# Patient Record
Sex: Male | Born: 1940
Health system: Southern US, Community
[De-identification: ages and names within clinical notes are randomized; demographics above are authoritative.]

## PROBLEM LIST (undated history)

## (undated) DIAGNOSIS — G479 Sleep disorder, unspecified: Secondary | ICD-10-CM

## (undated) DIAGNOSIS — R609 Edema, unspecified: Secondary | ICD-10-CM

## (undated) DIAGNOSIS — E538 Deficiency of other specified B group vitamins: Secondary | ICD-10-CM

## (undated) DIAGNOSIS — N390 Urinary tract infection, site not specified: Secondary | ICD-10-CM

## (undated) DIAGNOSIS — I10 Essential (primary) hypertension: Secondary | ICD-10-CM

## (undated) DIAGNOSIS — J302 Other seasonal allergic rhinitis: Secondary | ICD-10-CM

## (undated) DIAGNOSIS — G20 Parkinson's disease: Secondary | ICD-10-CM

## (undated) DIAGNOSIS — E785 Hyperlipidemia, unspecified: Secondary | ICD-10-CM

## (undated) DIAGNOSIS — R7303 Prediabetes: Secondary | ICD-10-CM

## (undated) DIAGNOSIS — N400 Enlarged prostate without lower urinary tract symptoms: Secondary | ICD-10-CM

## (undated) DIAGNOSIS — G20A1 Parkinson's disease without dyskinesia, without mention of fluctuations: Secondary | ICD-10-CM

## (undated) DIAGNOSIS — G4700 Insomnia, unspecified: Secondary | ICD-10-CM

## (undated) DIAGNOSIS — A419 Sepsis, unspecified organism: Secondary | ICD-10-CM

## (undated) HISTORY — DX: Other seasonal allergic rhinitis: J30.2

## (undated) HISTORY — DX: Insomnia, unspecified: G47.00

## (undated) HISTORY — DX: Edema, unspecified: R60.9

## (undated) HISTORY — DX: Benign prostatic hyperplasia without lower urinary tract symptoms: N40.0

## (undated) HISTORY — DX: Hyperlipidemia, unspecified: E78.5

## (undated) HISTORY — DX: Essential (primary) hypertension: I10

## (undated) HISTORY — DX: Sleep disorder, unspecified: G47.9

## (undated) HISTORY — DX: Urinary tract infection, site not specified: N39.0

---
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
## 1998-03-21 ENCOUNTER — Ambulatory Visit (HOSPITAL_COMMUNITY): Admission: RE | Admit: 1998-03-21 | Discharge: 1998-03-21 | Payer: Self-pay | Admitting: *Deleted

## 1998-04-10 ENCOUNTER — Encounter (HOSPITAL_COMMUNITY): Admission: RE | Admit: 1998-04-10 | Discharge: 1998-07-09 | Payer: Self-pay | Admitting: *Deleted

## 1998-08-14 ENCOUNTER — Ambulatory Visit (HOSPITAL_COMMUNITY): Admission: RE | Admit: 1998-08-14 | Discharge: 1998-08-14 | Payer: Self-pay | Admitting: *Deleted

## 2004-08-20 ENCOUNTER — Ambulatory Visit: Payer: Self-pay | Admitting: Internal Medicine

## 2005-02-18 ENCOUNTER — Ambulatory Visit: Payer: Self-pay | Admitting: Internal Medicine

## 2005-08-12 ENCOUNTER — Ambulatory Visit: Payer: Self-pay | Admitting: Internal Medicine

## 2005-08-20 ENCOUNTER — Ambulatory Visit: Payer: Self-pay | Admitting: Internal Medicine

## 2005-09-10 ENCOUNTER — Ambulatory Visit: Payer: Self-pay | Admitting: Internal Medicine

## 2006-02-17 ENCOUNTER — Ambulatory Visit: Payer: Self-pay | Admitting: Internal Medicine

## 2006-03-04 ENCOUNTER — Emergency Department (HOSPITAL_COMMUNITY): Admission: EM | Admit: 2006-03-04 | Discharge: 2006-03-04 | Payer: Self-pay | Admitting: Emergency Medicine

## 2006-08-11 ENCOUNTER — Ambulatory Visit: Payer: Self-pay | Admitting: Internal Medicine

## 2006-08-11 LAB — CONVERTED CEMR LAB
ALT: 24 units/L (ref 0–40)
AST: 23 units/L (ref 0–37)
Albumin: 3.6 g/dL (ref 3.5–5.2)
Alkaline Phosphatase: 86 units/L (ref 39–117)
BUN: 10 mg/dL (ref 6–23)
Basophils Absolute: 0 10*3/uL (ref 0.0–0.1)
Basophils Relative: 0.9 % (ref 0.0–1.0)
Bilirubin Urine: NEGATIVE
Bilirubin, Direct: 0.2 mg/dL (ref 0.0–0.3)
CO2: 30 meq/L (ref 19–32)
Calcium: 9.1 mg/dL (ref 8.4–10.5)
Chloride: 105 meq/L (ref 96–112)
Cholesterol: 152 mg/dL (ref 0–200)
Creatinine, Ser: 1.2 mg/dL (ref 0.4–1.5)
Crystals: NEGATIVE
Eosinophils Absolute: 0.1 10*3/uL (ref 0.0–0.6)
Eosinophils Relative: 3.3 % (ref 0.0–5.0)
GFR calc Af Amer: 78 mL/min
GFR calc non Af Amer: 64 mL/min
Glucose, Bld: 122 mg/dL — ABNORMAL HIGH (ref 70–99)
HCT: 43.6 % (ref 39.0–52.0)
HDL: 35.4 mg/dL — ABNORMAL LOW (ref >39.0)
Hemoglobin, Urine: NEGATIVE
Hemoglobin: 15.1 g/dL (ref 13.0–17.0)
LDL Cholesterol: 81 mg/dL (ref 0–99)
Lymphocytes Relative: 42.8 % (ref 12.0–46.0)
MCHC: 34.5 g/dL (ref 30.0–36.0)
MCV: 87.6 fL (ref 78.0–100.0)
Monocytes Absolute: 0.4 10*3/uL (ref 0.2–0.7)
Monocytes Relative: 10.7 % (ref 3.0–11.0)
Neutro Abs: 1.4 10*3/uL (ref 1.4–7.7)
Neutrophils Relative %: 42.3 % — ABNORMAL LOW (ref 43.0–77.0)
Nitrite: NEGATIVE
PSA: 0.24 ng/mL (ref 0.10–4.00)
Platelets: 214 10*3/uL (ref 150–400)
Potassium: 3.7 meq/L (ref 3.5–5.1)
RBC: 4.98 M/uL (ref 4.22–5.81)
RDW: 12.4 % (ref 11.5–14.6)
Sodium: 141 meq/L (ref 135–145)
Specific Gravity, Urine: 1.02 (ref 1.000–1.03)
TSH: 2.22 microintl units/mL (ref 0.35–5.50)
Total Bilirubin: 1.3 mg/dL — ABNORMAL HIGH (ref 0.3–1.2)
Total CHOL/HDL Ratio: 4.3
Total Protein, Urine: NEGATIVE mg/dL
Total Protein: 6.1 g/dL (ref 6.0–8.3)
Triglycerides: 180 mg/dL — ABNORMAL HIGH (ref 0–149)
Urine Glucose: NEGATIVE mg/dL
Urobilinogen, UA: 0.2 (ref 0.0–1.0)
VLDL: 36 mg/dL (ref 0–40)
WBC: 3.4 10*3/uL — ABNORMAL LOW (ref 4.5–10.5)
pH: 6.5 (ref 5.0–8.0)

## 2006-08-19 ENCOUNTER — Ambulatory Visit: Payer: Self-pay | Admitting: Internal Medicine

## 2007-08-26 ENCOUNTER — Ambulatory Visit: Payer: Self-pay | Admitting: Internal Medicine

## 2007-08-26 DIAGNOSIS — I10 Essential (primary) hypertension: Secondary | ICD-10-CM

## 2007-08-26 DIAGNOSIS — N529 Male erectile dysfunction, unspecified: Secondary | ICD-10-CM | POA: Insufficient documentation

## 2007-09-02 ENCOUNTER — Ambulatory Visit: Payer: Self-pay | Admitting: Gastroenterology

## 2007-09-02 ENCOUNTER — Ambulatory Visit: Payer: Self-pay | Admitting: Internal Medicine

## 2007-09-04 LAB — CONVERTED CEMR LAB
ALT: 18 units/L (ref 0–53)
AST: 18 units/L (ref 0–37)
Albumin: 3.7 g/dL (ref 3.5–5.2)
Alkaline Phosphatase: 86 units/L (ref 39–117)
BUN: 16 mg/dL (ref 6–23)
Basophils Absolute: 0 10*3/uL (ref 0.0–0.1)
Basophils Relative: 0.7 % (ref 0.0–1.0)
Bilirubin Urine: NEGATIVE
Bilirubin, Direct: 0.2 mg/dL (ref 0.0–0.3)
CO2: 30 meq/L (ref 19–32)
Calcium: 9.1 mg/dL (ref 8.4–10.5)
Chloride: 104 meq/L (ref 96–112)
Cholesterol: 129 mg/dL (ref 0–200)
Creatinine, Ser: 1.3 mg/dL (ref 0.4–1.5)
Eosinophils Absolute: 0.1 10*3/uL (ref 0.0–0.6)
Eosinophils Relative: 3 % (ref 0.0–5.0)
GFR calc Af Amer: 71 mL/min
GFR calc non Af Amer: 59 mL/min
Glucose, Bld: 109 mg/dL — ABNORMAL HIGH (ref 70–99)
HCT: 42 % (ref 39.0–52.0)
HDL: 23.1 mg/dL — ABNORMAL LOW (ref >39.0)
Hemoglobin, Urine: NEGATIVE
Hemoglobin: 14.7 g/dL (ref 13.0–17.0)
Ketones, ur: NEGATIVE mg/dL
LDL Cholesterol: 74 mg/dL (ref 0–99)
Leukocytes, UA: NEGATIVE
Lymphocytes Relative: 43.3 % (ref 12.0–46.0)
MCHC: 35 g/dL (ref 30.0–36.0)
MCV: 86.6 fL (ref 78.0–100.0)
Monocytes Absolute: 0.5 10*3/uL (ref 0.2–0.7)
Monocytes Relative: 12.3 % — ABNORMAL HIGH (ref 3.0–11.0)
Neutro Abs: 1.5 10*3/uL (ref 1.4–7.7)
Neutrophils Relative %: 40.7 % — ABNORMAL LOW (ref 43.0–77.0)
Nitrite: NEGATIVE
PSA: 0.19 ng/mL (ref 0.10–4.00)
Platelets: 204 10*3/uL (ref 150–400)
Potassium: 3.3 meq/L — ABNORMAL LOW (ref 3.5–5.1)
RBC: 4.85 M/uL (ref 4.22–5.81)
RDW: 12.3 % (ref 11.5–14.6)
Sodium: 141 meq/L (ref 135–145)
Specific Gravity, Urine: 1.025 (ref 1.000–1.03)
TSH: 2.29 microintl units/mL (ref 0.35–5.50)
Total Bilirubin: 1.5 mg/dL — ABNORMAL HIGH (ref 0.3–1.2)
Total CHOL/HDL Ratio: 5.6
Total Protein, Urine: NEGATIVE mg/dL
Total Protein: 6.1 g/dL (ref 6.0–8.3)
Triglycerides: 162 mg/dL — ABNORMAL HIGH (ref 0–149)
Urine Glucose: NEGATIVE mg/dL
Urobilinogen, UA: 0.2 (ref 0.0–1.0)
VLDL: 32 mg/dL (ref 0–40)
WBC: 3.7 10*3/uL — ABNORMAL LOW (ref 4.5–10.5)
pH: 5.5 (ref 5.0–8.0)

## 2007-09-23 ENCOUNTER — Encounter: Payer: Self-pay | Admitting: Gastroenterology

## 2007-09-23 ENCOUNTER — Ambulatory Visit: Payer: Self-pay | Admitting: Gastroenterology

## 2008-02-24 ENCOUNTER — Ambulatory Visit: Payer: Self-pay | Admitting: Internal Medicine

## 2008-08-23 ENCOUNTER — Ambulatory Visit: Payer: Self-pay | Admitting: Internal Medicine

## 2008-08-23 LAB — CONVERTED CEMR LAB
ALT: 19 units/L (ref 0–53)
Albumin: 3.7 g/dL (ref 3.5–5.2)
Alkaline Phosphatase: 108 units/L (ref 39–117)
BUN: 12 mg/dL (ref 6–23)
Bacteria, UA: NEGATIVE
Bilirubin, Direct: 0.3 mg/dL (ref 0.0–0.3)
CO2: 32 meq/L (ref 19–32)
Crystals: NEGATIVE
Eosinophils Relative: 2.6 % (ref 0.0–5.0)
GFR calc Af Amer: 96 mL/min
Glucose, Bld: 109 mg/dL — ABNORMAL HIGH (ref 70–99)
HCT: 43.3 % (ref 39.0–52.0)
HDL: 27.5 mg/dL — ABNORMAL LOW (ref >39.0)
Hemoglobin: 15.3 g/dL (ref 13.0–17.0)
Lymphocytes Relative: 45.8 % (ref 12.0–46.0)
Monocytes Absolute: 0.4 10*3/uL (ref 0.1–1.0)
Monocytes Relative: 10.8 % (ref 3.0–12.0)
Neutro Abs: 1.5 10*3/uL (ref 1.4–7.7)
Nitrite: NEGATIVE
Potassium: 3.6 meq/L (ref 3.5–5.1)
RBC: 4.97 M/uL (ref 4.22–5.81)
Sodium: 140 meq/L (ref 135–145)
Specific Gravity, Urine: 1.02 (ref 1.000–1.035)
Squamous Epithelial / LPF: NEGATIVE /lpf
Total CHOL/HDL Ratio: 5.2
Total Protein: 6.4 g/dL (ref 6.0–8.3)
WBC: 3.8 10*3/uL — ABNORMAL LOW (ref 4.5–10.5)
pH: 6.5 (ref 5.0–8.0)

## 2008-08-30 ENCOUNTER — Ambulatory Visit: Payer: Self-pay | Admitting: Internal Medicine

## 2008-08-30 DIAGNOSIS — H60399 Other infective otitis externa, unspecified ear: Secondary | ICD-10-CM | POA: Insufficient documentation

## 2008-08-30 DIAGNOSIS — N419 Inflammatory disease of prostate, unspecified: Secondary | ICD-10-CM | POA: Insufficient documentation

## 2009-02-28 ENCOUNTER — Ambulatory Visit: Payer: Self-pay | Admitting: Internal Medicine

## 2009-02-28 LAB — CONVERTED CEMR LAB
BUN: 10 mg/dL (ref 6–23)
CO2: 32 meq/L (ref 19–32)
Calcium: 8.9 mg/dL (ref 8.4–10.5)
Creatinine, Ser: 1.1 mg/dL (ref 0.4–1.5)
GFR calc non Af Amer: 85.29 mL/min (ref >60)
Glucose, Bld: 100 mg/dL — ABNORMAL HIGH (ref 70–99)

## 2009-03-07 ENCOUNTER — Ambulatory Visit: Payer: Self-pay | Admitting: Internal Medicine

## 2009-03-07 DIAGNOSIS — R252 Cramp and spasm: Secondary | ICD-10-CM | POA: Insufficient documentation

## 2009-09-07 ENCOUNTER — Ambulatory Visit: Payer: Self-pay | Admitting: Internal Medicine

## 2009-09-07 DIAGNOSIS — G4700 Insomnia, unspecified: Secondary | ICD-10-CM | POA: Insufficient documentation

## 2009-09-08 ENCOUNTER — Ambulatory Visit: Payer: Self-pay | Admitting: Internal Medicine

## 2009-09-11 LAB — CONVERTED CEMR LAB
ALT: 21 units/L (ref 0–53)
BUN: 11 mg/dL (ref 6–23)
Basophils Absolute: 0 10*3/uL (ref 0.0–0.1)
Bilirubin Urine: NEGATIVE
Bilirubin, Direct: 0.2 mg/dL (ref 0.0–0.3)
Calcium: 8.9 mg/dL (ref 8.4–10.5)
Cholesterol: 203 mg/dL — ABNORMAL HIGH (ref 0–200)
Creatinine, Ser: 1 mg/dL (ref 0.4–1.5)
Direct LDL: 115.9 mg/dL
GFR calc non Af Amer: 95.06 mL/min (ref >60)
HCT: 42.5 % (ref 39.0–52.0)
HDL: 37.6 mg/dL — ABNORMAL LOW (ref >39.00)
Hemoglobin, Urine: NEGATIVE
Leukocytes, UA: NEGATIVE
Lymphs Abs: 1.5 10*3/uL (ref 0.7–4.0)
Monocytes Relative: 12.9 % — ABNORMAL HIGH (ref 3.0–12.0)
Neutrophils Relative %: 37.9 % — ABNORMAL LOW (ref 43.0–77.0)
Nitrite: NEGATIVE
Platelets: 210 10*3/uL (ref 150.0–400.0)
RDW: 12.5 % (ref 11.5–14.6)
Total Bilirubin: 1.3 mg/dL — ABNORMAL HIGH (ref 0.3–1.2)
Total CHOL/HDL Ratio: 5
Triglycerides: 181 mg/dL — ABNORMAL HIGH (ref 0.0–149.0)
VLDL: 36.2 mg/dL (ref 0.0–40.0)
pH: 7 (ref 5.0–8.0)

## 2010-03-08 ENCOUNTER — Ambulatory Visit: Payer: Self-pay | Admitting: Internal Medicine

## 2010-07-07 DIAGNOSIS — G4700 Insomnia, unspecified: Secondary | ICD-10-CM | POA: Insufficient documentation

## 2010-07-07 DIAGNOSIS — N400 Enlarged prostate without lower urinary tract symptoms: Secondary | ICD-10-CM | POA: Insufficient documentation

## 2010-07-07 DIAGNOSIS — E785 Hyperlipidemia, unspecified: Secondary | ICD-10-CM | POA: Insufficient documentation

## 2010-07-26 NOTE — Assessment & Plan Note (Signed)
Summary: 6 mos f/u ...#...cd   Vital Signs:  Patient profile:   70 year old male Height:      67 inches Weight:      204 pounds BMI:     32.07 Temp:     97.4 degrees F oral Pulse rate:   72 / minute Pulse rhythm:   regular Resp:     16 per minute BP sitting:   108 / 70  (left arm) Cuff size:   regular  Vitals Entered By: Lanier Prude, CMA(AAMA) (March 08, 2010 8:50 AM) CC: 6 mo f/u Is Patient Diabetic? No   CC:  6 mo f/u.  History of Present Illness: F/u HTN, ED, insomnia  Current Medications (verified): 1)  Lotrel 10-20 Mg  Caps (Amlodipine Besy-Benazepril Hcl) .[name].. 1 By Mouth Qd 2)  Aspirin 81 Mg  Tbec (Aspirin) .... One By Mouth Every Day 3)  Vitamin D3 1000 Unit  Tabs (Cholecalciferol) .[name].. 1 By Mouth Daily 4)  Nortriptyline Hcl 10 Mg Caps (Nortriptyline Hcl) .[name].. 1-2 By Mouth At Bedtime For Sleep  Allergies (verified): 1)  ! Pcn 2)  ! Sulfa 3)  Lipitor  Family History: Reviewed history from 08/26/2007 and no changes required. old age 64 - poss. 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    Physical Exam  General:  NAD Nose:  External nasal examination shows no deformity or inflammation. Nasal mucosa are pink and moist without lesions or exudates. Mouth:  Oral mucosa and oropharynx without lesions or exudates.  Teeth in good repair. Neck:  No deformities, masses, or tenderness noted. Abdomen:  Bowel sounds positive,abdomen soft and non-tender without masses, organomegaly or hernias noted. Msk:  No deformity or scoliosis noted of thoracic or lumbar spine.  B LE NT Extremities:  No clubbing, cyanosis, edema, or deformity noted with normal full range of motion of all joints.   Neurologic:  No cranial nerve deficits noted. Station and gait are normal. Plantar reflexes are  down-going bilaterally. DTRs are symmetrical throughout. Sensory, motor and coordinative functions appear intact. Skin:  Intact without suspicious lesions or rashes Psych:  Cognition and judgment appear intact. Alert and cooperative with normal attention span and concentration. No apparent delusions, illusions, hallucination snot depressed appearing.     Impression & Recommendations:  Problem # 1:  HYPERTENSION (ICD-401.9)  His updated medication list for this problem includes:    Lotrel 10-20 Mg Caps (Amlodipine besy-benazepril hcl) .[name]... 1 by mouth qd  Problem # 2:  ERECTILE DYSFUNCTION (ZOX-096.04)  Problem # 3:  BENIGN PROSTATIC HYPERTROPHY (ICD-600.00) Assessment: Improved  Complete Medication List: 1)  Lotrel 10-20 Mg Caps (Amlodipine besy-benazepril hcl) .[name].. 1 by mouth qd 2)  Aspirin 81 Mg Tbec (Aspirin) .... One by mouth every day 3)  Vitamin D3 1000 Unit Tabs (Cholecalciferol) .[name].. 1 by mouth daily 4)  Nortriptyline Hcl 10 Mg Caps (Nortriptyline hcl) .[name].. 1-2 by mouth at bedtime for sleep  Patient Instructions: 1)  Please schedule a follow-up appointment in 6 months well w/labs.  Contraindications/Deferment of Procedures/Staging:    Test/Procedure: FLU VAX    Reason for deferment: patient declined  Prescriptions: LOTREL 10-20 MG  CAPS (AMLODIPINE BESY-BENAZEPRIL HCL) 1 by mouth qd  #90 x 3   Entered and Authorized by:   [name] MD   Signed by:   [name] MD  on 03/08/2010   Method used:   Print then Give to Patient   RxID:   [id number]    Not Administered:    Influenza Vaccine not given due to: declined

## 2010-07-26 NOTE — Assessment & Plan Note (Signed)
Summary: YEARLY--#---STC   Vital Signs:  Patient profile:   70 year old male Weight:      211 pounds Temp:     97.9 degrees F oral Pulse rate:   68 / minute BP sitting:   142 / 90  (left arm)  Vitals Entered By: Tora Perches (September 07, 2009 10:15 AM) CC: cpx Is Patient Diabetic? No   CC:  cpx.  History of Present Illness: The patient presents for a wellness examination. Not taking Lipitor 6 months ago for a ? reason, poss. cramps. No cramps now. C/o sleep issues  Preventive Screening-Counseling & Management  Alcohol-Tobacco     Smoking Status: current  Caffeine-Diet-Exercise     Does Patient Exercise: no  Current Medications (verified): 1)  Lipitor 10 Mg Tabs (Atorvastatin Calcium) .[name].. 1 By Mouth Daily 2)  Lotrel 10-20 Mg  Caps (Amlodipine Besy-Benazepril Hcl) .[name].. 1 By Mouth Qd 3)  Aspirin 81 Mg  Tbec (Aspirin) .... One By Mouth Every Day 4)  Vitamin D3 1000 Unit  Tabs (Cholecalciferol) .[name].. 1 By Mouth Daily  Allergies: 1)  ! Pcn 2)  ! Sulfa 3)  Lipitor  Past History:  Family History: Last updated: 08/26/2007 old age 17 - poss. DM  Past Medical History: Hyperlipidemia Hypertension Benign prostatic hypertrophy  Dr Brunilda Payor Insomnia  Past Surgical History: Denies surgical history  Social History: Rides motorcycle Married Current Smoker rare cigars Alcohol use-no Retired Regular exercise-no Does Patient Exercise:  no  Review of Systems  The patient denies anorexia, fever, weight loss, weight gain, vision loss, decreased hearing, hoarseness, chest pain, syncope, dyspnea on exertion, peripheral edema, prolonged cough, headaches, hemoptysis, abdominal pain, melena, hematochezia, severe indigestion/heartburn, hematuria, incontinence, genital sores, muscle weakness, suspicious skin lesions, transient blindness, difficulty walking, depression, unusual weight change, abnormal bleeding, enlarged lymph nodes, angioedema, and testicular masses.    Physical  Exam  General:  NAD Head:  Normocephalic and atraumatic without obvious abnormalities. No apparent alopecia or balding. Eyes:  No corneal or conjunctival inflammation noted. EOMI. Perrla. Ears:  External ear exam shows no significant lesions or deformities.  Otoscopic examination reveals clear canals, tympanic membranes are intact bilaterally without bulging, retraction, inflammation or discharge. Hearing is grossly normal bilaterally. Nose:  External nasal examination shows no deformity or inflammation. Nasal mucosa are pink and moist without lesions or exudates. Mouth:  Oral mucosa and oropharynx without lesions or exudates.  Teeth in good repair. Neck:  No deformities, masses, or tenderness noted. Lungs:  Normal respiratory effort, chest expands symmetrically. Lungs are clear to auscultation, no crackles or wheezes. Heart:  Normal rate and regular rhythm. S1 and S2 normal without gallop, murmur, click, rub or other extra sounds. Abdomen:  Bowel sounds positive,abdomen soft and non-tender without masses, organomegaly or hernias noted. Rectal:  Declined Genitalia:  Declined Prostate:  just checked by Dr Brunilda Payor Msk:  No deformity or scoliosis noted of thoracic or lumbar spine.  B LE NT Pulses:  R and L carotid,radial,femoral,dorsalis pedis and posterior tibial pulses are full and equal bilaterally Extremities:  No clubbing, cyanosis, edema, or deformity noted with normal full range of motion of all joints.   Neurologic:  No cranial nerve deficits noted. Station and gait are normal. Plantar reflexes are down-going bilaterally. DTRs are symmetrical throughout. Sensory, motor and coordinative functions appear intact. Skin:  Intact without suspicious lesions or rashes Cervical Nodes:  No lymphadenopathy noted Inguinal Nodes:  No significant adenopathy Psych:  Cognition and judgment appear intact. Alert and cooperative with  normal attention span and concentration. No apparent delusions, illusions,  hallucination snot depressed appearing.     Impression & Recommendations:  Problem # 1:  WELL ADULT EXAM (ICD-V70.0) Assessment New Had a PSA w/Dr Nesi Orders: EKG w/ Interpretation (93000) Health and age related issues were discussed. Available screening tests and vaccinations were discussed as well. Healthy life style including good diet and execise was discussed.  The labs are  pending   Problem # 2:  HYPERTENSION (ICD-401.9) Assessment: Unchanged  His updated medication list for this problem includes:    Lotrel 10-20 Mg Caps (Amlodipine besy-benazepril hcl) .[name]... 1 by mouth qd  Problem # 3:  HYPERLIPIDEMIA (ICD-272.4) Assessment: Comment Only He stopped his Lipitor for ? reason - poss. due to cramps. The following medications were removed from the medication list:    Lipitor 10 Mg Tabs (Atorvastatin calcium) .[name]... 1 by mouth daily Risks of noncompliance with treatment discussed. Compliance encouraged.  Problem # 4:  INSOMNIA, CHRONIC (ICD-307.42) Assessment: Deteriorated Try Nortr. 10 mg. Denies OSA.  Complete Medication List: 1)  Lotrel 10-20 Mg Caps (Amlodipine besy-benazepril hcl) .[name].. 1 by mouth qd 2)  Aspirin 81 Mg Tbec (Aspirin) .... One by mouth every day 3)  Vitamin D3 1000 Unit Tabs (Cholecalciferol) .[name].. 1 by mouth daily 4)  Nortriptyline Hcl 10 Mg Caps (Nortriptyline hcl) .[name].. 1-2 by mouth at bedtime for sleep  Contraindications/Deferment of Procedures/Staging:    Test/Procedure: FLU VAX    Reason for deferment: patient declined     Test/Procedure: Pneumovax vaccine    Reason for deferment: patient declined   Patient Instructions: 1)  Tomorrow: 2)  BMP prior to visit, ICD-9: v70.0 272.0  401.1 3)  Hepatic Panel prior to visit, ICD-9: 4)  Lipid Panel prior to visit, ICD-9: 5)  TSH prior to visit, ICD-9: 6)  CBC w/ Diff prior to visit, ICD-9: 7)  Urine-dip prior to visit, ICD-9: 8)  Please schedule a follow-up appointment in 6  months. Prescriptions: NORTRIPTYLINE HCL 10 MG CAPS (NORTRIPTYLINE HCL) 1-2 by mouth at bedtime for sleep  #60 x 6   Entered and Authorized by:   [name] MD   Signed by:   [name] MD on 09/07/2009   Method used:   Print then Give to Patient   RxID:   [id number]

## 2010-09-03 ENCOUNTER — Other Ambulatory Visit: Payer: Self-pay

## 2010-09-05 ENCOUNTER — Other Ambulatory Visit: Payer: Self-pay | Admitting: Internal Medicine

## 2010-09-05 ENCOUNTER — Other Ambulatory Visit: Payer: MEDICARE

## 2010-09-05 ENCOUNTER — Encounter (INDEPENDENT_AMBULATORY_CARE_PROVIDER_SITE_OTHER): Payer: Self-pay | Admitting: *Deleted

## 2010-09-05 DIAGNOSIS — Z0000 Encounter for general adult medical examination without abnormal findings: Secondary | ICD-10-CM

## 2010-09-05 DIAGNOSIS — E785 Hyperlipidemia, unspecified: Secondary | ICD-10-CM

## 2010-09-05 DIAGNOSIS — I10 Essential (primary) hypertension: Secondary | ICD-10-CM

## 2010-09-05 DIAGNOSIS — N400 Enlarged prostate without lower urinary tract symptoms: Secondary | ICD-10-CM

## 2010-09-05 DIAGNOSIS — N419 Inflammatory disease of prostate, unspecified: Secondary | ICD-10-CM

## 2010-09-05 LAB — CBC WITH DIFFERENTIAL/PLATELET
Basophils Absolute: 0 10*3/uL (ref 0.0–0.1)
Eosinophils Absolute: 0.1 10*3/uL (ref 0.0–0.7)
Lymphocytes Relative: 47.1 % — ABNORMAL HIGH (ref 12.0–46.0)
MCHC: 35.2 g/dL (ref 30.0–36.0)
Neutrophils Relative %: 38.2 % — ABNORMAL LOW (ref 43.0–77.0)
RDW: 12.6 % (ref 11.5–14.6)

## 2010-09-05 LAB — BASIC METABOLIC PANEL
BUN: 14 mg/dL (ref 6–23)
CO2: 29 mEq/L (ref 19–32)
Calcium: 8.8 mg/dL (ref 8.4–10.5)
Creatinine, Ser: 1.1 mg/dL (ref 0.4–1.5)
Glucose, Bld: 103 mg/dL — ABNORMAL HIGH (ref 70–99)

## 2010-09-05 LAB — URINALYSIS, ROUTINE W REFLEX MICROSCOPIC
Specific Gravity, Urine: >=1.03 (ref 1.000–1.030)
Total Protein, Urine: NEGATIVE
Urine Glucose: NEGATIVE
Urobilinogen, UA: 2 (ref 0.0–1.0)
pH: 6 (ref 5.0–8.0)

## 2010-09-05 LAB — LIPID PANEL
Cholesterol: 194 mg/dL (ref 0–200)
HDL: 31.1 mg/dL — ABNORMAL LOW (ref >39.00)
Triglycerides: 174 mg/dL — ABNORMAL HIGH (ref 0.0–149.0)

## 2010-09-05 LAB — HEPATIC FUNCTION PANEL
ALT: 18 U/L (ref 0–53)
AST: 19 U/L (ref 0–37)
Total Bilirubin: 1.4 mg/dL — ABNORMAL HIGH (ref 0.3–1.2)

## 2010-09-05 LAB — TSH: TSH: 1.58 u[IU]/mL (ref 0.35–5.50)

## 2010-09-10 ENCOUNTER — Encounter: Payer: Self-pay | Admitting: Internal Medicine

## 2010-09-10 ENCOUNTER — Encounter (INDEPENDENT_AMBULATORY_CARE_PROVIDER_SITE_OTHER): Payer: MEDICARE | Admitting: Internal Medicine

## 2010-09-10 DIAGNOSIS — Z0000 Encounter for general adult medical examination without abnormal findings: Secondary | ICD-10-CM

## 2010-09-10 DIAGNOSIS — I10 Essential (primary) hypertension: Secondary | ICD-10-CM

## 2010-09-10 DIAGNOSIS — G4700 Insomnia, unspecified: Secondary | ICD-10-CM

## 2010-09-10 DIAGNOSIS — N419 Inflammatory disease of prostate, unspecified: Secondary | ICD-10-CM

## 2010-09-20 NOTE — Assessment & Plan Note (Signed)
Summary: PHYSICAL / SECURE HORIZIONS / NWS  #   Vital Signs:  Patient profile:   70 year old male Height:      67 inches Weight:      209 pounds BMI:     32.85 Temp:     98.5 degrees F oral Pulse rate:   76 / minute Pulse rhythm:   regular Resp:     16 per minute BP sitting:   120 / 82  (left arm) Cuff size:   regular  Vitals Entered By: Lanier Prude, Beverly Gust) (September 10, 2010 8:52 AM) CC: MWV Is Patient Diabetic? No   CC:  MWV.  History of Present Illness: The patient presents for a preventive health examination  Patient past medical history, social history, and family history reviewed in detail no significant changes.  Patient is physically active. Depression is negative and mood is good. Hearing is normal, and able to perform activities of daily living. Risk of falling is negligible and home safety has been reviewed and is appropriate. Patient has normal height, nl weight, and visual acuity. Patient has been counseled on age-appropriate routine health concerns for screening and prevention. Education, counseling done. Cognition is nl. To review abn labs C/o ED  Preventive Screening-Counseling & Management  Alcohol-Tobacco     Alcohol drinks/day: <1     Alcohol type: wine     Smoking Status: current     Smoking Cessation Counseling: yes  Caffeine-Diet-Exercise     Caffeine use/day: 2 cups     Does Patient Exercise: yes     Type of exercise: push ups     Times/week: 7     Depression Counseling: not indicated; screening negative for depression  Hep-HIV-STD-Contraception     Hepatitis Risk: no risk noted     Dental Visit-last 6 months no     TSE monthly: no     Sun Exposure-Excessive: no  Safety-Violence-Falls     Seat Belt Use: yes     Helmet Use: yes     Firearms in the Home: firearms in the home     Smoke Detectors: yes     Violence in the Home: no risk noted     Sexual Abuse: no     Fall Risk: none      Sexual History:  currently monogamous.    Current  Medications (verified): 1)  Lotrel 10-20 Mg  Caps (Amlodipine Besy-Benazepril Hcl) .[name].. 1 By Mouth Qd 2)  Aspirin 81 Mg  Tbec (Aspirin) .... One By Mouth Every Day 3)  Vitamin D3 1000 Unit  Tabs (Cholecalciferol) .[name].. 1 By Mouth Daily 4)  Nortriptyline Hcl 10 Mg Caps (Nortriptyline Hcl) .[name].. 1-2 By Mouth At Bedtime For Sleep  Allergies (verified): 1)  ! Pcn 2)  ! Sulfa 3)  Lipitor  Past History:  Past Surgical History: Last updated: 09/07/2009 Denies surgical history  Family History: Last updated: 08/26/2007 old age 75 - poss. 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; does Aeronautical engineer  Past Medical History: Hyperlipidemia Hypertension Benign prostatic hypertrophy  Dr Brunilda Payor UTI Insomnia  Social History: Caffeine use/day:  2 cups Does Patient Exercise:  yes Dental Care w/in 6 mos.:  no Sun Exposure-Excessive:  no Seat Belt Use:  yes Fall Risk:  none Hepatitis Risk:  no risk noted Sexual History:  currently monogamous  Review of Systems  The patient denies anorexia, fever, weight loss, weight gain, vision loss, decreased hearing, hoarseness,  chest pain, syncope, dyspnea on exertion, peripheral edema, prolonged cough, headaches, hemoptysis, abdominal pain, melena, hematochezia, severe indigestion/heartburn, hematuria, incontinence, genital sores, muscle weakness, suspicious skin lesions, transient blindness, difficulty walking, depression, unusual weight change, abnormal bleeding, enlarged lymph nodes, angioedema, and testicular masses.         ED  Physical Exam  Rectal:  per Dr Brunilda Payor 3/8 Prostate:  per Dr Brunilda Payor 3/8   Impression & Recommendations:  Problem # 1:  WELL ADULT EXAM (ICD-V70.0) Assessment New  Overall doing well, age appropriate education and counseling updated and referral for appropriate preventive services done unless declined, immunizations up to  date or declined, diet counseling done if overweight, urged to quit smoking if smokes, most recent labs reviewed and current ordered if appropriate, ecg reviewed or declined (interpretation per ECG scanned in the EMR if done); information regarding Medicare Preventation requirements given if appropriate.  I have personally reviewed the Medicare Annual Wellness questionnaire and have noted 1.   The patient's medical and social history 2.   Their use of alcohol, tobacco or illicit drugs 3.   Their current medications and supplements 4.   The patient's functional ability including ADL's, fall risks, home safety risks and hearing or visual             impairment. 5.   Diet and physical activities 6.   Evidence for depression or mood disorders The patients weight, height, BMI and visual acuity have been recorded in the chart I have made referrals, counseling and provided education to the patient based review of the above and I have provided the pt with a written personalized care plan for preventive services. The labs were reviewed with the patient.  He refused all shots  Orders: Medicare -1st Annual Wellness Visit [phone])  Problem # 2:  PROSTATITIS (ICD-601.9) Assessment: New The labs were reviewed with the patient.   Problem # 3:  ERECTILE DYSFUNCTION (ICD-607.84) Assessment: Deteriorated  His updated medication list for this problem includes:    Cialis 20 Mg Tabs (Tadalafil) .[name]... 1/2 or 1 by mouth q 1-3 d prn  Problem # 4:  INSOMNIA, CHRONIC (ICD-307.42) Assessment: Unchanged He declined Rx  Problem # 5:  HYPERTENSION (ICD-401.9) Assessment: Unchanged  His updated medication list for this problem includes:    Lotrel 10-20 Mg Caps (Amlodipine besy-benazepril hcl) .[name]... 1 by mouth qd  Complete Medication List: 1)  Lotrel 10-20 Mg Caps (Amlodipine besy-benazepril hcl) .[name].. 1 by mouth qd 2)  Aspirin 81 Mg Tbec (Aspirin) .... One by mouth every day 3)  Vitamin D3 1000 Unit Tabs  (Cholecalciferol) .[name].. 1 by mouth daily 4)  Nortriptyline Hcl 10 Mg Caps (Nortriptyline hcl) .[name].. 1-2 by mouth at bedtime for sleep 5)  Travatan Z 0.004 % Soln (Travoprost) .[name].. 1 drop in both eyes at bedtime 6)  Combigan 0.2-0.5 % Soln (Brimonidine tartrate-timolol) .[name].. 1 drop in both eyes two times a day 7)  Ciprofloxacin Hcl 500 Mg Tabs (Ciprofloxacin hcl) .[name].. 1 by mouth two times a day x 20 days 8)  Cialis 20 Mg Tabs (Tadalafil) .... 1/2 or 1 by mouth q 1-3 d prn  Patient Instructions: 1)  Please schedule a follow-up appointment in 3 months. 2)  Urine-dip prior to visit, ICD-9:595.0 3)  BMP prior to visit, ICD-9: 401.1 Prescriptions: CIALIS 20 MG TABS (TADALAFIL) 1/2 or 1 by mouth q 1-3 d prn  #6 x 12   Entered and Authorized by:   [name] MD   Signed  by:   [name] MD on 09/10/2010   Method used:   Print then Give to Patient   RxID:   [id number] LOTREL 10-20 MG  CAPS (AMLODIPINE BESY-BENAZEPRIL HCL) 1 by mouth qd  #90 x 3   Entered and Authorized by:   [name] MD   Signed by:   [name] MD on 09/10/2010   Method used:   Print then Give to Patient   RxID:   [id number] CIPROFLOXACIN HCL 500 MG TABS (CIPROFLOXACIN HCL) 1 by mouth two times a day x 20 days  #40 x 0   Entered and Authorized by:   [name] MD   Signed by:   [name] MD on 09/10/2010   Method used:   Print then Give to Patient   RxID:   [id number]    Orders Added: 1)  Medicare -1st Annual Wellness Visit [G0438] 2)  Est. Patient Level III [53664]

## 2010-11-09 NOTE — Assessment & Plan Note (Signed)
[facility]                           PRIMARY CARE OFFICE NOTE   [name], [name]                       MRN:          [id number]  DATE:08/19/2006                            DOB:          20-Feb-1941    The patient is a 70 year old male who presents for a wellness  examination.   PAST MEDICAL HISTORY/FAMILY HISTORY/SOCIAL HISTORY:  As per August 20, 2005 note.  He broke his left foot when he fell off the ladder a few  months ago.   ALLERGIES:  PENICILLIN AND SULFA.   MEDICINES:  1. Cardura.  2. Maxzide.  3. Folic acid.  4. Aspirin.  5. Lotrel.  6. Potassium.  7. Lipitor reviewed on the chart.   REVIEW OF SYSTEMS:  No chest pain or shortness of breath.  No syncope,  no cough.  Developed chills and fever over past week with some prostate  discomfort.  The rest of the 18-point review of systems are negative.   PHYSICAL EXAMINATION:  Blood pressure 144/89, pulse 80, temp 98.7,  weight 210 pounds.  He is in no acute distress.  HEENT:  Moist mucosa.  NECK:  Supple.  No thyromegaly or bruit.  LUNGS:  Clear to auscultation and percussion, no wheezes or rales.  HEART:  S1, S2, no murmur or gallop.  ABDOMEN:  Soft, nontender.  No organomegaly, no mass.  EXTREMITIES:  No edema.  NEUROLOGIC:  He is alert and cooperative, denies being depressed.  RECTAL EXAMINATION:  Revealed slightly enlarged prostate, no nodules.  Rectal exam, no rectal masses, stool guaiac negative.  SKIN:  Clear.   LABORATORY DATA:  Labs on August 11, 2006, CBC normal, CMET normal,  glucose 122, cholesterol 152, TSH normal, PSA 0.24, urinalysis with 5-10  wbc's.   EKG today was normal sinus rhythm.   ASSESSMENT AND PLAN:  1. Normal wellness examination.  Age/health related issues discussed.      Healthy lifestyle discussed.  Advised to schedule colonoscopy,      however, he wants to do it next year (he had one by Dr. Tad Moore about      10 years ago).  2. History of  left ankle fracture in 2007, healed well.  3. Possible urinary tract infection or prostatitis, given doxycycline      100 p.o. daily for 10 days.  Recheck urinalysis in about a month.  4. Coronary artery disease, continue current therapy.  5. History of normal chest x-ray with a normal CT in 2007.  Obtain      chest x-ray in 2009.     Georgina Quint. Plotnikov, MD  Electronically Signed    AVP/MedQ  DD: 08/27/2006  DT: 08/27/2006  Job #: [id number]

## 2010-12-06 ENCOUNTER — Encounter: Payer: Self-pay | Admitting: Internal Medicine

## 2010-12-06 ENCOUNTER — Other Ambulatory Visit (INDEPENDENT_AMBULATORY_CARE_PROVIDER_SITE_OTHER): Payer: Self-pay

## 2010-12-06 ENCOUNTER — Other Ambulatory Visit: Payer: Self-pay | Admitting: Internal Medicine

## 2010-12-06 DIAGNOSIS — N3000 Acute cystitis without hematuria: Secondary | ICD-10-CM

## 2010-12-06 DIAGNOSIS — I10 Essential (primary) hypertension: Secondary | ICD-10-CM

## 2010-12-06 LAB — URINALYSIS
Hgb urine dipstick: NEGATIVE
Leukocytes, UA: NEGATIVE
Nitrite: NEGATIVE
Urobilinogen, UA: 1 (ref 0.0–1.0)

## 2010-12-06 LAB — BASIC METABOLIC PANEL
Calcium: 8.7 mg/dL (ref 8.4–10.5)
Creatinine, Ser: 1 mg/dL (ref 0.4–1.5)
GFR: 95.83 mL/min (ref >60.00)
Sodium: 139 mEq/L (ref 135–145)

## 2010-12-10 ENCOUNTER — Ambulatory Visit (INDEPENDENT_AMBULATORY_CARE_PROVIDER_SITE_OTHER): Payer: Medicare Other | Admitting: Internal Medicine

## 2010-12-10 ENCOUNTER — Encounter: Payer: Self-pay | Admitting: Internal Medicine

## 2010-12-10 DIAGNOSIS — E876 Hypokalemia: Secondary | ICD-10-CM

## 2010-12-10 DIAGNOSIS — R739 Hyperglycemia, unspecified: Secondary | ICD-10-CM | POA: Insufficient documentation

## 2010-12-10 DIAGNOSIS — E785 Hyperlipidemia, unspecified: Secondary | ICD-10-CM

## 2010-12-10 DIAGNOSIS — I10 Essential (primary) hypertension: Secondary | ICD-10-CM

## 2010-12-10 DIAGNOSIS — N400 Enlarged prostate without lower urinary tract symptoms: Secondary | ICD-10-CM

## 2010-12-10 DIAGNOSIS — R7309 Other abnormal glucose: Secondary | ICD-10-CM

## 2010-12-10 MED ORDER — POTASSIUM CHLORIDE ER 8 MEQ PO TBCR: 8.0000 meq | EXTENDED_RELEASE_TABLET | Freq: Every day | ORAL | Status: DC

## 2010-12-10 NOTE — Assessment & Plan Note (Signed)
Low fat diet

## 2010-12-10 NOTE — Assessment & Plan Note (Signed)
He took an Abx 3 wks ago

## 2010-12-10 NOTE — Progress Notes (Signed)
  Subjective:    Patient ID: [name], male    DOB: 10/25/1940, 70 y.o.   MRN: [id number]  HPI   F/u HTN, elev glu, BPH. Sweating a lot mowing yards   Review of Systems  Constitutional: Negative for chills and activity change.  Eyes: Negative for redness.  Respiratory: Negative for cough.   Gastrointestinal: Negative for blood in stool.  Genitourinary: Negative for frequency and testicular pain.  Musculoskeletal: Negative for joint swelling.  Skin: Negative for wound.  Neurological: Negative for light-headedness.  Psychiatric/Behavioral: Negative for sleep disturbance and dysphoric mood.   Wt Readings from Last 3 Encounters:  12/10/10 209 lb (94.802 kg)  09/10/10 209 lb (94.802 kg)  03/08/10 204 lb (92.534 kg)      Objective:   Physical Exam  Constitutional: He is oriented to person, place, and time. He appears well-developed.  HENT:  Mouth/Throat: Oropharynx is clear and moist.  Eyes: Conjunctivae are normal. Pupils are equal, round, and reactive to light.  Neck: Normal range of motion. No JVD present. No thyromegaly present.  Cardiovascular: Normal rate, regular rhythm, normal heart sounds and intact distal pulses.  Exam reveals no gallop and no friction rub.   No murmur heard. Pulmonary/Chest: Effort normal and breath sounds normal. No respiratory distress. He has no wheezes. He has no rales. He exhibits no tenderness.  Abdominal: Soft. Bowel sounds are normal. He exhibits no distension and no mass. There is no tenderness. There is no rebound and no guarding.  Musculoskeletal: Normal range of motion. He exhibits no edema and no tenderness.  Lymphadenopathy:    He has no cervical adenopathy.  Neurological: He is alert and oriented to person, place, and time. He has normal reflexes. No cranial nerve deficit. He exhibits normal muscle tone. Coordination normal.  Skin: Skin is warm and dry. No rash noted.  Psychiatric: He has a normal mood and affect. His behavior is  normal. Judgment and thought content normal.       Lab Results  Component Value Date   WBC 3.4* 09/05/2010   HGB 14.5 09/05/2010   HCT 41.2 09/05/2010   PLT 195.0 09/05/2010   CHOL 194 09/05/2010   TRIG 174.0* 09/05/2010   HDL 31.10* 09/05/2010   LDLDIRECT 115.9 09/08/2009   ALT 18 09/05/2010   AST 19 09/05/2010   NA 139 12/06/2010   K 3.2* 12/06/2010   CL 103 12/06/2010   CREATININE 1.0 12/06/2010   BUN 14 12/06/2010   CO2 28 12/06/2010   TSH 1.58 09/05/2010   PSA 0.25 09/05/2010      Assessment & Plan:

## 2010-12-10 NOTE — Assessment & Plan Note (Signed)
On Rx. Check BP at home

## 2010-12-10 NOTE — Patient Instructions (Signed)
Normal BP<130/85 

## 2010-12-10 NOTE — Assessment & Plan Note (Signed)
Start KCl 

## 2010-12-10 NOTE — Assessment & Plan Note (Signed)
Worse. Check A1c in 4 mo

## 2011-04-08 ENCOUNTER — Other Ambulatory Visit (INDEPENDENT_AMBULATORY_CARE_PROVIDER_SITE_OTHER): Payer: Medicare Other

## 2011-04-08 DIAGNOSIS — R7309 Other abnormal glucose: Secondary | ICD-10-CM

## 2011-04-08 DIAGNOSIS — R739 Hyperglycemia, unspecified: Secondary | ICD-10-CM

## 2011-04-08 DIAGNOSIS — E876 Hypokalemia: Secondary | ICD-10-CM

## 2011-04-08 LAB — HEMOGLOBIN A1C: Hgb A1c MFr Bld: 5.6 % (ref 4.6–6.5)

## 2011-04-08 LAB — COMPREHENSIVE METABOLIC PANEL
AST: 17 U/L (ref 0–37)
Alkaline Phosphatase: 93 U/L (ref 39–117)
BUN: 12 mg/dL (ref 6–23)
Creatinine, Ser: 0.9 mg/dL (ref 0.4–1.5)
Total Bilirubin: 0.7 mg/dL (ref 0.3–1.2)

## 2011-04-15 ENCOUNTER — Encounter: Payer: Self-pay | Admitting: Internal Medicine

## 2011-04-15 ENCOUNTER — Ambulatory Visit (INDEPENDENT_AMBULATORY_CARE_PROVIDER_SITE_OTHER): Payer: Medicare Other | Admitting: Internal Medicine

## 2011-04-15 DIAGNOSIS — E876 Hypokalemia: Secondary | ICD-10-CM

## 2011-04-15 DIAGNOSIS — R739 Hyperglycemia, unspecified: Secondary | ICD-10-CM

## 2011-04-15 DIAGNOSIS — R7309 Other abnormal glucose: Secondary | ICD-10-CM

## 2011-04-15 DIAGNOSIS — R252 Cramp and spasm: Secondary | ICD-10-CM

## 2011-04-15 DIAGNOSIS — I10 Essential (primary) hypertension: Secondary | ICD-10-CM

## 2011-04-15 MED ORDER — OLMESARTAN-AMLODIPINE-HCTZ 40-10-25 MG PO TABS: 1.0000 | ORAL_TABLET | ORAL | Status: DC

## 2011-04-15 NOTE — Assessment & Plan Note (Signed)
corrected

## 2011-04-15 NOTE — Assessment & Plan Note (Signed)
BP Readings from Last 3 Encounters:  04/15/11 152/90  12/10/10 140/90  09/10/10 120/82   Will adust Rx

## 2011-04-15 NOTE — Assessment & Plan Note (Signed)
Monitoring labs Lab Results  Component Value Date   HGBA1C 5.6 04/08/2011

## 2011-04-15 NOTE — Assessment & Plan Note (Signed)
resolved 

## 2011-04-15 NOTE — Patient Instructions (Signed)
Start Tribenzor 1 a day after you run out of your Amlo/benazepril  BP Readings from Last 3 Encounters:  04/15/11 152/90  12/10/10 140/90  09/10/10 120/82

## 2011-04-15 NOTE — Progress Notes (Signed)
  Subjective:    Patient ID: [name], male    DOB: [birth year], 70 y.o.   MRN: [id number]  HPI  The patient presents for a follow-up of  chronic hypertension, elev glucose, hypokalemia controlled with medicines and diet    Review of Systems  Constitutional: Negative for appetite change, fatigue and unexpected weight change.  HENT: Negative for nosebleeds, congestion, sore throat, sneezing, trouble swallowing and neck pain.   Eyes: Negative for itching and visual disturbance.  Respiratory: Negative for cough.   Cardiovascular: Negative for chest pain, palpitations and leg swelling.  Gastrointestinal: Negative for nausea, diarrhea, blood in stool and abdominal distention.  Genitourinary: Negative for frequency and hematuria.  Musculoskeletal: Negative for back pain, joint swelling and gait problem.  Skin: Negative for rash.  Neurological: Negative for dizziness, tremors, speech difficulty and weakness.  Psychiatric/Behavioral: Negative for sleep disturbance, dysphoric mood and agitation. The patient is not nervous/anxious.    Wt Readings from Last 3 Encounters:  04/15/11 205 lb (92.987 kg)  12/10/10 209 lb (94.802 kg)  09/10/10 209 lb (94.802 kg)       Objective:   Physical Exam  Constitutional: He is oriented to person, place, and time. He appears well-developed.  HENT:  Mouth/Throat: Oropharynx is clear and moist.  Eyes: Conjunctivae are normal. Pupils are equal, round, and reactive to light.  Neck: Normal range of motion. No JVD present. No thyromegaly present.  Cardiovascular: Normal rate, regular rhythm, normal heart sounds and intact distal pulses.  Exam reveals no gallop and no friction rub.   No murmur heard. Pulmonary/Chest: Effort normal and breath sounds normal. No respiratory distress. He has no wheezes. He has no rales. He exhibits no tenderness.  Abdominal: Soft. Bowel sounds are normal. He exhibits no distension and no mass. There is no tenderness. There is no  rebound and no guarding.  Musculoskeletal: Normal range of motion. He exhibits no edema and no tenderness.  Lymphadenopathy:    He has no cervical adenopathy.  Neurological: He is alert and oriented to person, place, and time. He has normal reflexes. No cranial nerve deficit. He exhibits normal muscle tone. Coordination normal.  Skin: Skin is warm and dry. No rash noted.  Psychiatric: He has a normal mood and affect. His behavior is normal. Judgment and thought content normal.   Lab Results  Component Value Date   WBC 3.4* 09/05/2010   HGB 14.5 09/05/2010   HCT 41.2 09/05/2010   PLT 195.0 09/05/2010   GLUCOSE 100* 04/08/2011   CHOL 194 09/05/2010   TRIG 174.0* 09/05/2010   HDL 31.10* 09/05/2010   LDLDIRECT 115.9 09/08/2009   LDLCALC 128* 09/05/2010   ALT 16 04/08/2011   AST 17 04/08/2011   NA 140 04/08/2011   K 3.6 04/08/2011   CL 104 04/08/2011   CREATININE 0.9 04/08/2011   BUN 12 04/08/2011   CO2 31 04/08/2011   TSH 1.58 09/05/2010   PSA 0.25 09/05/2010   HGBA1C 5.6 04/08/2011          Assessment & Plan:

## 2011-04-22 ENCOUNTER — Inpatient Hospital Stay (HOSPITAL_COMMUNITY)
Admission: EM | Admit: 2011-04-22 | Discharge: 2011-04-23 | DRG: 728 | Disposition: A | Discharge status: Home / Self Care | Payer: Medicare Other | Attending: Internal Medicine | Admitting: Internal Medicine

## 2011-04-22 ENCOUNTER — Emergency Department (HOSPITAL_COMMUNITY): Payer: Medicare Other

## 2011-04-22 DIAGNOSIS — N400 Enlarged prostate without lower urinary tract symptoms: Secondary | ICD-10-CM | POA: Diagnosis present

## 2011-04-22 DIAGNOSIS — I10 Essential (primary) hypertension: Secondary | ICD-10-CM | POA: Diagnosis present

## 2011-04-22 DIAGNOSIS — F17200 Nicotine dependence, unspecified, uncomplicated: Secondary | ICD-10-CM | POA: Diagnosis present

## 2011-04-22 DIAGNOSIS — E876 Hypokalemia: Secondary | ICD-10-CM | POA: Diagnosis present

## 2011-04-22 DIAGNOSIS — N390 Urinary tract infection, site not specified: Secondary | ICD-10-CM | POA: Diagnosis present

## 2011-04-22 DIAGNOSIS — R6883 Chills (without fever): Secondary | ICD-10-CM | POA: Diagnosis present

## 2011-04-22 DIAGNOSIS — N410 Acute prostatitis: Principal | ICD-10-CM | POA: Diagnosis present

## 2011-04-22 LAB — URINE MICROSCOPIC-ADD ON

## 2011-04-22 LAB — COMPREHENSIVE METABOLIC PANEL
ALT: 11 U/L (ref 0–53)
AST: 11 U/L (ref 0–37)
Albumin: 3.2 g/dL — ABNORMAL LOW (ref 3.5–5.2)
CO2: 27 mEq/L (ref 19–32)
Calcium: 8.8 mg/dL (ref 8.4–10.5)
Chloride: 102 mEq/L (ref 96–112)
GFR calc non Af Amer: 68 mL/min — ABNORMAL LOW (ref >90)
Sodium: 136 mEq/L (ref 135–145)

## 2011-04-22 LAB — URINALYSIS, ROUTINE W REFLEX MICROSCOPIC
Glucose, UA: NEGATIVE mg/dL
Specific Gravity, Urine: 1.026 (ref 1.005–1.030)
Urobilinogen, UA: 1 mg/dL (ref 0.0–1.0)
pH: 5.5 (ref 5.0–8.0)

## 2011-04-22 LAB — CBC
MCH: 30.7 pg (ref 26.0–34.0)
Platelets: 141 10*3/uL — ABNORMAL LOW (ref 150–400)
RBC: 4.46 MIL/uL (ref 4.22–5.81)
RDW: 12.2 % (ref 11.5–15.5)
WBC: 7.5 10*3/uL (ref 4.0–10.5)

## 2011-04-22 LAB — POCT I-STAT, CHEM 8
Calcium, Ion: 1.15 mmol/L (ref 1.12–1.32)
HCT: 39 % (ref 39.0–52.0)
TCO2: 23 mmol/L (ref 0–100)

## 2011-04-22 LAB — DIFFERENTIAL
Basophils Relative: 0 % (ref 0–1)
Eosinophils Absolute: 0 10*3/uL (ref 0.0–0.7)
Neutrophils Relative %: 88 % — ABNORMAL HIGH (ref 43–77)

## 2011-04-23 LAB — BASIC METABOLIC PANEL
BUN: 10 mg/dL (ref 6–23)
CO2: 26 mEq/L (ref 19–32)
Calcium: 9 mg/dL (ref 8.4–10.5)
Chloride: 103 mEq/L (ref 96–112)
Creatinine, Ser: 0.93 mg/dL (ref 0.50–1.35)
GFR calc Af Amer: >90 mL/min (ref >90)
Glucose, Bld: 109 mg/dL — ABNORMAL HIGH (ref 70–99)

## 2011-04-23 LAB — CBC
HCT: 39 % (ref 39.0–52.0)
MCH: 30.5 pg (ref 26.0–34.0)
MCV: 85.5 fL (ref 78.0–100.0)
Platelets: 133 10*3/uL — ABNORMAL LOW (ref 150–400)
RBC: 4.56 MIL/uL (ref 4.22–5.81)
RDW: 12.3 % (ref 11.5–15.5)

## 2011-04-23 LAB — HEPATIC FUNCTION PANEL
ALT: 9 U/L (ref 0–53)
Alkaline Phosphatase: 90 U/L (ref 39–117)
Bilirubin, Direct: 0.2 mg/dL (ref 0.0–0.3)
Indirect Bilirubin: 0.8 mg/dL (ref 0.3–0.9)
Total Bilirubin: 1 mg/dL (ref 0.3–1.2)
Total Protein: 6.1 g/dL (ref 6.0–8.3)

## 2011-04-23 LAB — GC/CHLAMYDIA PROBE AMP, URINE: GC Probe Amp, Urine: NEGATIVE

## 2011-04-23 NOTE — H&P (Signed)
[name], [name] NO.:  [id number]  MEDICAL RECORD NO.:  [id number]  LOCATION:  [facility]                         FACILITY:  [facility]  PHYSICIAN:  Gery Pray, MD      DATE OF BIRTH:  [birth year]  DATE OF ADMISSION:  04/22/2011 DATE OF DISCHARGE:                             HISTORY & PHYSICAL   PRIMARY CARE PHYSICIAN:   CODE STATUS:  Full code, the patient goes to Team 4.  CHIEF COMPLAINT:  Rigors.  HISTORY OF PRESENT ILLNESS:  This is a 70 year old gentleman who has not really been feeling well since yesterday.  Approximately at 10 p.m., he has complained of urinary frequency, but with a little output.  The patient does have a history of BPH and is on medications started several months ago.  He states that he has a burning urination mostly at the tip of the penis.  Per his wife, he has had a low-grade fever.  No nausea, no vomiting.  Yesterday, he went to bed and he woke up with shaking chills.  They were quite severe.  The wife called 911.  He was brought to the ER.  He reports no altered mental status.  He denied major diaphoresis.  History obtained from the patient and the wife.  In the ER, patient had a rectal done by ER physician.  His prostate was boggy and tender on examination.  REVIEW OF SYSTEMS:  All 10-point systems reviewed and negative except as noted in HPI.  PAST MEDICAL HISTORY:  Includes; 1. BPH. 2. Hypertension.  PAST SURGICAL HISTORY:  None.  MEDICATIONS: 1. Lotrel 10/20 daily. 2. Aspirin 81 mg daily. 3. Potassium 8 mEq daily. 4. Nortriptyline 10 mg one to two caps qhs. 5. Rapaflo 8 mg p.o. daily.  ALLERGIES:  Patient is allergic to PENICILLIN, SULFA, BIAXIN and NAPROSYN.  SOCIAL HISTORY:  Positive for tobacco.  Negative for alcohol or illicit drugs as well home oxygen.  Lives with his wife who is at his bedside.  FAMILY HISTORY:  Significant for diabetes mellitus.  PHYSICAL EXAMINATION:  VITALS:  Blood pressure 113/56,  pulse 112, respiration 20, temperature 100.6, saturation 93% on room air. GENERAL:  Alert, oriented male, currently in no acute distress. EYES:  Pink conjunctiva.  Red sclerae.  PERRLA. ENT:  Moist oral mucosa.  Trachea midline. NECK:  Supple. LUNGS:  Clear to auscultation bilaterally.  No wheeze.  No use of accessory muscles. CARDIOVASCULAR:  Regular rate and rhythm without murmurs, rigors, or gallops. ABDOMEN:  Soft, positive bowel sounds.  No tenderness to palpation.  No organomegaly. NEURO:  Cranial nerves II through 12 grossly intact.  Sensation intact. MUSCULOSKELETAL:  Strength 5/5 in all extremities.  The patient does appear to have trace edema on the right extremity.  Otherwise normal. SKIN:  No rashes.  No subcutaneous crepitation.  LABS:  White count 7.5, hemoglobin 13.7, platelets 141.  Chest x-ray: Linear fibrosis or atelectasis.  No evidence of acute pulmonary disease. Sodium 130, potassium 3.3, chloride 108, BUN 16, creatinine 1.  UA shows 11-20 whites, nitrites negative, moderate leukocyte esterase, bacteria many.  ASSESSMENT AND PLAN: 1. Sepsis due to urinary tract infection. 2. Prostatitis. 3.  Benign prostatic hypertrophy.  Patient will be admitted.  Blood     cultures have been collected.  The patient's rigors has resolved.     We will continue the patient with IV antibiotics.  Tylenol p.r.n.     fever. 4. Hypokalemia.  Continue patient's potassium supplementation. 5. Hypertension.  Resume home medication. 6. Tobacco abuse.  Nicotine patch will be ordered.          ______________________________ Gery Pray, MD     DC/MEDQ  D:  04/22/2011  T:  04/22/2011  Job:  [id number]  Electronically Signed by Gery Pray MD on 04/23/2011 02:29:09 PM

## 2011-04-24 LAB — URINE CULTURE

## 2011-04-25 NOTE — Discharge Summary (Signed)
[name], [name] NO.:  [id number]  MEDICAL RECORD NO.:  [id number]  LOCATION:  1514                         FACILITY:  [facility]  PHYSICIAN:  Ramiro Harvest, MD    DATE OF BIRTH:  03-Feb-1941  DATE OF ADMISSION:  04/22/2011 DATE OF DISCHARGE:  04/23/2011                        DISCHARGE SUMMARY   PRIMARY CARE PHYSICIAN:  Georgina Quint. Plotnikov, MD  DISCHARGE DIAGNOSES: 1. Acute prostatitis. 2. Urinary tract infection. 3. Hypokalemia, resolved. 4. Benign prostatic hypertrophy. 5. Hypertension. 6. Tobacco abuse.  DISCHARGE MEDICATIONS: 1. Ciprofloxacin 500 mg p.o. b.i.d. x14 days. 2. Aspirin 81 mg p.o. daily. 3. Klor-Con 8 mEq p.o. daily. 4. Lotrel 10/20 one tablet p.o. daily. 5. Nortriptyline 10 mg p.o. daily. 6. Rapaflo 8 mg p.o. daily.  DISPOSITION ON FOLLOWUP:  The patient will be discharged home.  The patient is to follow up with his urologist, Dr. Brunilda Payor 1 week post discharge.  On followup, the patient's prostatitis will need to be reassessed.  The patient has been discharged home on 2 weeks of oral ciprofloxacin.  We will defer to the patient's urologist as to whether the patient needs greater than 2 weeks of antibiotic therapy.  Urine cultures will need to be followed up upon.  The patient is also to follow up with his PCP 2 weeks post discharge.  On follow up, blood cultures will need to be followed up upon and the patient will need to be reassessed.  CONSULTATIONS DONE:  None.  PROCEDURES PERFORMED:  A chest x-ray was done on April 22, 2011, that showed linear fibrosis or atelectasis in the left lung base.  No evidence of active pulmonary disease.  BRIEF ADMISSION HISTORY AND PHYSICAL:  [name] is a 70 year old African American gentleman, who had not feeling well 1 day prior to admission.  Approximately at 10 p.m. the day before admission, he complained of urinary frequency, but with very little urinary output. The patient does  have a history of BPH and some medications that he started on several months ago.  He stated that he had a burning urination mostly at the tip of his penis.  Per his wife, he had also a low-grade fever.  No nausea, no vomiting.  One day prior to admission, he went to bed, woke up with shaking chills.  They were quite severe. Wife called 911.  He was brought to the ED.  The patient reported no altered mental status.  He denied any major diaphoresis.  History was obtained from the patient and his wife.  In the ED, the patient had a rectal exam done by the ED physician and his prostate was noted to be boggy and tender on examination.  For the rest of admission history and physical, please see H and P dictated by Dr. Joneen Roach of job [phone].  HOSPITAL COURSE: 1. Acute prostatitis.  The patient was admitted and had some chills.     Urinalysis which was done was consistent with a urinary tract     infection.  The patient had also had a rectal exam done per the ED     physician and it was noted to be boggy and tender on examination.  The patient was placed on IV Rocephin.  Blood cultures were     obtained.  Urine cultures were pending at the time of this     dictation.  The patient was maintained on IV Rocephin.  The patient     improved clinically during the hospitalization.  He will be     discharged home on oral ciprofloxacin for 2 more weeks and to     follow up with his urologist.  A urine GC and Chlamydia probe was     obtained, which was negative.  The patient on day of discharge, was     in stable and improved condition.  He will follow up with his     urologist as an outpatient. 2. Complicated urinary tract infection.  On admission the patient, was     noted per urinalysis, was consistent with a urinary tract     infection.  Urine cultures were pending at the time of this     dictation.  The patient was placed on IV Rocephin.  The patient     improved clinically.  He will be  discharged on oral ciprofloxacin     for a 2-week course.  The patient will follow up with his urologist     1 week post discharge.  Urine cultures will need to be obtained on     followup, and the patient will be discharged home in stable and     improved condition. 3. Hypokalemia.  During the hospitalization, the patient was noted to     be slightly hypokalemic.  The patient's potassium was repleted and     his hypokalemia had resolved by day of discharge.  The patient will     need a followup BMET with his PCP to follow up on this.  The rest     of the patient's chronic medical issues have remained stable     throughout the hospitalization.  The patient will be discharged     home in stable and improved condition.  On day of discharge, vital     signs, temperature 97.7, pulse of 83, respirations 20, blood     pressure 128/63, satting 95% on room air.  DISCHARGE LABS:  Urine GC and Chlamydia negative.  Hepatic panel; bilirubin 1, direct bilirubin 0.2, indirect bilirubin 0.8, alk phosphatase 90, AST 10, ALT 9, protein 6.1, albumin of 3.1.  BMET with a sodium of 136, potassium 3.6, chloride 103, bicarb 26, glucose 109, BUN 10, creatinine 0.93, calcium of 9.0.  CBC with a white count of 7.5, hemoglobin 13.9, hematocrit 39.0, and a platelet count of a 133, magnesium level of 2.0.  It has been a pleasure taking care of [name].     Ramiro Harvest, MD     DT/MEDQ  D:  04/23/2011  T:  04/23/2011  Job:  [id number]  cc:   Lindaann Slough, M.D. Fax: [phone]  Georgina Quint. Plotnikov, MD [address] [place] [address]  Electronically Signed by Ramiro Harvest MD on 04/25/2011 10:35:09 AM

## 2011-04-28 LAB — CULTURE, BLOOD (ROUTINE X 2): Culture: NO GROWTH

## 2011-08-19 ENCOUNTER — Ambulatory Visit (INDEPENDENT_AMBULATORY_CARE_PROVIDER_SITE_OTHER)
Admission: RE | Admit: 2011-08-19 | Discharge: 2011-08-19 | Disposition: A | Discharge status: Home / Self Care | Payer: Medicare Other | Source: Ambulatory Visit | Attending: Internal Medicine | Admitting: Internal Medicine

## 2011-08-19 ENCOUNTER — Encounter: Payer: Self-pay | Admitting: Internal Medicine

## 2011-08-19 ENCOUNTER — Ambulatory Visit (INDEPENDENT_AMBULATORY_CARE_PROVIDER_SITE_OTHER): Payer: Medicare Other | Admitting: Internal Medicine

## 2011-08-19 VITALS — BP 130/64 | HR 79 | Temp 97.3°F | Resp 16 | Wt 208.0 lb

## 2011-08-19 DIAGNOSIS — S61409A Unspecified open wound of unspecified hand, initial encounter: Secondary | ICD-10-CM

## 2011-08-19 DIAGNOSIS — T148 Other injury of unspecified body region: Secondary | ICD-10-CM

## 2011-08-19 DIAGNOSIS — L03119 Cellulitis of unspecified part of limb: Secondary | ICD-10-CM

## 2011-08-19 DIAGNOSIS — S61452A Open bite of left hand, initial encounter: Secondary | ICD-10-CM | POA: Insufficient documentation

## 2011-08-19 DIAGNOSIS — L089 Local infection of the skin and subcutaneous tissue, unspecified: Secondary | ICD-10-CM

## 2011-08-19 DIAGNOSIS — T148XXA Other injury of unspecified body region, initial encounter: Secondary | ICD-10-CM

## 2011-08-19 DIAGNOSIS — Z23 Encounter for immunization: Secondary | ICD-10-CM

## 2011-08-19 DIAGNOSIS — S61459A Open bite of unspecified hand, initial encounter: Secondary | ICD-10-CM

## 2011-08-19 DIAGNOSIS — L02519 Cutaneous abscess of unspecified hand: Secondary | ICD-10-CM

## 2011-08-19 MED ORDER — DOXYCYCLINE HYCLATE 100 MG PO TABS: 100.0000 mg | ORAL_TABLET | Freq: Two times a day (BID) | ORAL | Status: AC

## 2011-08-19 NOTE — Patient Instructions (Signed)
Cellulitis Cellulitis is an infection of the skin and the tissue beneath it. The area is typically red and tender. It is caused by germs (bacteria) (usually staph or strep) that enter the body through cuts or sores. Cellulitis most commonly occurs in the arms or lower legs.  HOME CARE INSTRUCTIONS   If you are given a prescription for medications which kill germs (antibiotics), take as directed until finished.   If the infection is on the arm or leg, keep the limb elevated as able.   Use a warm cloth several times per day to relieve pain and encourage healing.   See your caregiver for recheck of the infected site as directed if problems arise.   Only take over-the-counter or prescription medicines for pain, discomfort, or fever as directed by your caregiver.  SEEK MEDICAL CARE IF:   The area of redness (inflammation) is spreading, there are red streaks coming from the infected site, or if a part of the infection begins to turn dark in color.   The joint or bone underneath the infected skin becomes painful after the skin has healed.   The infection returns in the same or another area after it seems to have gone away.   A boil or bump swells up. This may be an abscess.   New, unexplained problems such as pain or fever develop.  SEEK IMMEDIATE MEDICAL CARE IF:   You have a fever.   You or your child feels drowsy or lethargic.   There is vomiting, diarrhea, or lasting discomfort or feeling ill (malaise) with muscle aches and pains.  MAKE SURE YOU:   Understand these instructions.   Will watch your condition.   Will get help right away if you are not doing well or get worse.  Document Released: 03/20/2005 Document Revised: 02/20/2011 Document Reviewed: 01/27/2008 [facility] Patient Information 2012 [place], [place].Hand Injuries Minor fractures, sprains, bruises and burns of the hand are often managed in much the same way:  Elevation of your hand above the level of your heart for a  few days after the injury, until the pain and swelling improve.   The use of hand dressings and splints to reduce motion, relieve painand prevent reinjury. The dressing and splint should not be removed without the caregiver's approval.   Application of ice packs for 20 to 30 minutes every few hours for 2 to 3 days to reduce pain and swelling due to fractures, sprains, and deep bruises.   The use of medicine to reduce pain and inflammation.  Early motion exercises are sometimes needed to reduce joint stiffness after a hand injury. However, your hand should not be used for any activities that increase pain. Document Released: 07/18/2004 Document Revised: 02/20/2011 Document Reviewed: 09/09/2008 [facility] Patient Information 2012 [place], [place].

## 2011-08-19 NOTE — Progress Notes (Signed)
  Subjective:    Patient ID: [name], male    DOB: [birth year], 71 y.o.   MRN: [id number]  Animal Bite  Episode onset: 4 days ago. The incident occurred at home. He came to the ER via personal transport. There is an injury to the left hand. The patient is experiencing no pain. It is unknown if a foreign body is present. Pertinent negatives include no chest pain, no abdominal pain, no bowel incontinence, no nausea, no vomiting, no inability to bear weight, no neck pain, no pain when bearing weight, no cough and no difficulty breathing. There have been no prior injuries to these areas. His tetanus status is unknown.      Review of Systems  Constitutional: Negative for fever, chills, diaphoresis, activity change, appetite change, fatigue and unexpected weight change.  HENT: Negative.  Negative for neck pain.   Eyes: Negative.   Respiratory: Negative for apnea, cough, choking, chest tightness, shortness of breath, wheezing and stridor.   Cardiovascular: Negative for chest pain, palpitations and leg swelling.  Gastrointestinal: Negative for nausea, vomiting, abdominal pain, diarrhea and bowel incontinence.  Genitourinary: Negative.   Musculoskeletal: Negative for myalgias, back pain, joint swelling, arthralgias and gait problem.  Skin: Positive for color change and wound (left hand with redness and swelling). Negative for pallor and rash.  Neurological: Negative.   Hematological: Negative for adenopathy. Does not bruise/bleed easily.  Psychiatric/Behavioral: Negative.        Objective:   Physical Exam  Musculoskeletal:       Left hand: He exhibits tenderness, deformity, laceration and swelling. He exhibits normal range of motion, no bony tenderness, normal two-point discrimination and normal capillary refill. normal sensation noted. Decreased sensation is not present in the ulnar distribution, is not present in the medial redistribution and is not present in the radial distribution. Normal  strength noted. He exhibits no finger abduction, no thumb/finger opposition and no wrist extension trouble.       Hands:      The dorsum of the left hand shows diffuse erythema and warmth but there is no streaking or fluctuance          Assessment & Plan:

## 2011-08-19 NOTE — Assessment & Plan Note (Signed)
Wound clx sent, start doxycycline, see ortho/hand surgery today

## 2011-08-19 NOTE — Assessment & Plan Note (Signed)
He reports a severe allergy to PCN so I started doxycycline instead, I think he needs to see a hand surgeon asap to consider a surgical cleaning of the infection and wounds

## 2011-08-19 NOTE — Assessment & Plan Note (Signed)
I will check an xray of the hand to look for FB, free air, osteomyelitis

## 2011-08-20 ENCOUNTER — Other Ambulatory Visit: Payer: Medicare Other

## 2011-08-20 DIAGNOSIS — T148XXA Other injury of unspecified body region, initial encounter: Secondary | ICD-10-CM

## 2011-08-22 LAB — WOUND CULTURE: Gram Stain: NONE SEEN

## 2011-10-14 ENCOUNTER — Ambulatory Visit (INDEPENDENT_AMBULATORY_CARE_PROVIDER_SITE_OTHER): Payer: Medicare Other | Admitting: Internal Medicine

## 2011-10-14 ENCOUNTER — Encounter: Payer: Self-pay | Admitting: Internal Medicine

## 2011-10-14 VITALS — BP 158/80 | HR 80 | Temp 98.4°F | Resp 16 | Wt 210.0 lb

## 2011-10-14 DIAGNOSIS — R739 Hyperglycemia, unspecified: Secondary | ICD-10-CM

## 2011-10-14 DIAGNOSIS — G479 Sleep disorder, unspecified: Secondary | ICD-10-CM

## 2011-10-14 DIAGNOSIS — E876 Hypokalemia: Secondary | ICD-10-CM

## 2011-10-14 DIAGNOSIS — N419 Inflammatory disease of prostate, unspecified: Secondary | ICD-10-CM

## 2011-10-14 DIAGNOSIS — G4750 Parasomnia, unspecified: Secondary | ICD-10-CM | POA: Insufficient documentation

## 2011-10-14 DIAGNOSIS — E785 Hyperlipidemia, unspecified: Secondary | ICD-10-CM

## 2011-10-14 DIAGNOSIS — R7309 Other abnormal glucose: Secondary | ICD-10-CM

## 2011-10-14 DIAGNOSIS — I10 Essential (primary) hypertension: Secondary | ICD-10-CM

## 2011-10-14 DIAGNOSIS — G478 Other sleep disorders: Secondary | ICD-10-CM

## 2011-10-14 HISTORY — DX: Sleep disorder, unspecified: G47.9

## 2011-10-14 MED ORDER — AMLODIPINE BESY-BENAZEPRIL HCL 10-20 MG PO CAPS: 1.0000 | ORAL_CAPSULE | Freq: Every day | ORAL | Status: DC

## 2011-10-14 NOTE — Assessment & Plan Note (Signed)
Tribenzor is too $$$ He wants to go back on Lotrel

## 2011-10-14 NOTE — Assessment & Plan Note (Signed)
Restless sleep, agitation Will consult Dr Craige Cotta

## 2011-10-14 NOTE — Progress Notes (Signed)
Patient ID: [name], male   DOB: Mar 09, 1941, 71 y.o.   MRN: [id number]  Subjective:    Patient ID: [name], male    DOB: 01-25-1941, 71 y.o.   MRN: [id number]  HPI  The patient presents for a follow-up of  chronic hypertension, elev glucose, hypokalemia controlled with medicines and diet. C/o restless sleep, agitation - wife is worried.  F/u hosp stay 10/12:  DISCHARGE DIAGNOSES:  1. Acute prostatitis.  2. Urinary tract infection.  3. Hypokalemia, resolved.  4. Benign prostatic hypertrophy.  5. Hypertension.  6. Tobacco abuse.  DISCHARGE MEDICATIONS:  1. Ciprofloxacin 500 mg p.o. b.i.d. x14 days.  2. Aspirin 81 mg p.o. daily.  3. Klor-Con 8 mEq p.o. daily.  4. Lotrel 10/20 one tablet p.o. daily.  5. Nortriptyline 10 mg p.o. daily.  6. Rapaflo 8 mg p.o. daily.     Review of Systems  Constitutional: Negative for appetite change, fatigue and unexpected weight change.  HENT: Negative for nosebleeds, congestion, sore throat, sneezing, trouble swallowing and neck pain.   Eyes: Negative for itching and visual disturbance.  Respiratory: Negative for cough.   Cardiovascular: Negative for chest pain, palpitations and leg swelling.  Gastrointestinal: Negative for nausea, diarrhea, blood in stool and abdominal distention.  Genitourinary: Negative for frequency and hematuria.  Musculoskeletal: Negative for [name] pain, joint swelling and gait problem.  Skin: Negative for rash.  Neurological: Negative for dizziness, tremors, speech difficulty and weakness.  Psychiatric/Behavioral: Negative for sleep disturbance, dysphoric mood and agitation. The patient is not nervous/anxious.    Wt Readings from Last 3 Encounters:  10/14/11 210 lb (95.255 kg)  08/19/11 208 lb (94.348 kg)  04/15/11 205 lb (92.987 kg)   BP Readings from Last 3 Encounters:  10/14/11 158/80  08/19/11 130/64  04/15/11 152/90        Objective:   Physical Exam  Constitutional: He is oriented to person,  place, and time. He appears well-developed.  HENT:  Mouth/Throat: Oropharynx is clear and moist.  Eyes: Conjunctivae are normal. Pupils are equal, round, and reactive to light.  Neck: Normal range of motion. No JVD present. No thyromegaly present.  Cardiovascular: Normal rate, regular rhythm, normal heart sounds and intact distal pulses.  Exam reveals no gallop and no friction rub.   No murmur heard. Pulmonary/Chest: Effort normal and breath sounds normal. No respiratory distress. He has no wheezes. He has no rales. He exhibits no tenderness.  Abdominal: Soft. Bowel sounds are normal. He exhibits no distension and no mass. There is no tenderness. There is no rebound and no guarding.  Musculoskeletal: Normal range of motion. He exhibits no edema and no tenderness.  Lymphadenopathy:    He has no cervical adenopathy.  Neurological: He is alert and oriented to person, place, and time. He has normal reflexes. No cranial nerve deficit. He exhibits normal muscle tone. Coordination normal.  Skin: Skin is warm and dry. No rash noted.  Psychiatric: He has a normal mood and affect. His behavior is normal. Judgment and thought content normal.   Lab Results  Component Value Date   WBC 7.5 04/23/2011   HGB 13.9 04/23/2011   HCT 39.0 04/23/2011   PLT 133* 04/23/2011   GLUCOSE 109* 04/23/2011   CHOL 194 09/05/2010   TRIG 174.0* 09/05/2010   HDL 31.10* 09/05/2010   LDLDIRECT 115.9 09/08/2009   LDLCALC 128* 09/05/2010   ALT 9 04/23/2011   AST 10 04/23/2011   NA 136 04/23/2011   K 3.6 04/23/2011  CL 103 04/23/2011   CREATININE 0.93 04/23/2011   BUN 10 04/23/2011   CO2 26 04/23/2011   TSH 1.58 09/05/2010   PSA 0.25 09/05/2010   HGBA1C 5.6 04/08/2011          Assessment & Plan:

## 2011-10-14 NOTE — Assessment & Plan Note (Signed)
No relapse 

## 2011-10-14 NOTE — Assessment & Plan Note (Signed)
Monitoring

## 2011-10-14 NOTE — Assessment & Plan Note (Signed)
Declined statins. 

## 2011-10-14 NOTE — Assessment & Plan Note (Signed)
Watching  

## 2011-11-04 ENCOUNTER — Encounter: Payer: Self-pay | Admitting: Internal Medicine

## 2011-11-04 ENCOUNTER — Ambulatory Visit (INDEPENDENT_AMBULATORY_CARE_PROVIDER_SITE_OTHER): Payer: Medicare Other | Admitting: Internal Medicine

## 2011-11-04 VITALS — BP 130/80 | HR 62 | Temp 98.4°F | Wt 210.0 lb

## 2011-11-04 DIAGNOSIS — R253 Fasciculation: Secondary | ICD-10-CM

## 2011-11-04 DIAGNOSIS — H02409 Unspecified ptosis of unspecified eyelid: Secondary | ICD-10-CM

## 2011-11-04 DIAGNOSIS — I10 Essential (primary) hypertension: Secondary | ICD-10-CM

## 2011-11-04 DIAGNOSIS — R259 Unspecified abnormal involuntary movements: Secondary | ICD-10-CM

## 2011-11-04 DIAGNOSIS — H02429 Myogenic ptosis of unspecified eyelid: Secondary | ICD-10-CM | POA: Insufficient documentation

## 2011-11-04 NOTE — Assessment & Plan Note (Signed)
Continue with current prescription therapy as reflected on the Med list.  

## 2011-11-04 NOTE — Assessment & Plan Note (Addendum)
R eye 5/13 S/p opth cons - he was asked to have an MRI done - will order

## 2011-11-04 NOTE — Assessment & Plan Note (Signed)
R>L eye 5/13 S/p opth cons - he was asked to have an MRI done - will order

## 2011-11-04 NOTE — Progress Notes (Signed)
Patient ID: [name], male   DOB: 10/23/1940, 71 y.o.   MRN: [id number] Patient ID: [name], male   DOB: Oct 05, 1940, 71 y.o.   MRN: [id number]  Subjective:    Patient ID: [name], male    DOB: [birth year], 71 y.o.   MRN: [id number]  Eye Problem  Pertinent negatives include no nausea or weakness.   Dr Kerry [facility]) saw the pt for his R eye blinking x 12 mo; his R eyelid is drooping...  The patient presents for a follow-up of  chronic hypertension, elev glucose, hypokalemia controlled with medicines and diet.   Review of Systems  Constitutional: Negative for appetite change, fatigue and unexpected weight change.  HENT: Negative for nosebleeds, congestion, sore throat, sneezing, trouble swallowing and neck pain.   Eyes: Negative for itching and visual disturbance.  Respiratory: Negative for cough.   Cardiovascular: Negative for chest pain, palpitations and leg swelling.  Gastrointestinal: Negative for nausea, diarrhea, blood in stool and abdominal distention.  Genitourinary: Negative for frequency and hematuria.  Musculoskeletal: Negative for [name] pain, joint swelling and gait problem.  Skin: Negative for rash.  Neurological: Negative for dizziness, tremors, speech difficulty and weakness.  Psychiatric/Behavioral: Negative for sleep disturbance, dysphoric mood and agitation. The patient is not nervous/anxious.    Wt Readings from Last 3 Encounters:  11/04/11 210 lb (95.255 kg)  10/14/11 210 lb (95.255 kg)  08/19/11 208 lb (94.348 kg)   BP Readings from Last 3 Encounters:  11/04/11 130/80  10/14/11 158/80  08/19/11 130/64        Objective:   Physical Exam  Constitutional: He is oriented to person, place, and time. He appears well-developed.  HENT:  Mouth/Throat: Oropharynx is clear and moist.  Eyes: Conjunctivae are normal. Pupils are equal, round, and reactive to light. Right eye exhibits no discharge. Left eye exhibits no discharge.       B ptosis  Neck: Normal  range of motion. No JVD present. No thyromegaly present.  Cardiovascular: Normal rate, regular rhythm, normal heart sounds and intact distal pulses.  Exam reveals no gallop and no friction rub.   No murmur heard. Pulmonary/Chest: Effort normal and breath sounds normal. No respiratory distress. He has no wheezes. He has no rales. He exhibits no tenderness.  Abdominal: Soft. Bowel sounds are normal. He exhibits no distension and no mass. There is no tenderness. There is no rebound and no guarding.  Musculoskeletal: Normal range of motion. He exhibits no edema and no tenderness.  Lymphadenopathy:    He has no cervical adenopathy.  Neurological: He is alert and oriented to person, place, and time. He has normal reflexes. A cranial nerve deficit is present. He exhibits normal muscle tone. Coordination normal.       Mild R facial droop Mild R eyelid twitching  Skin: Skin is warm and dry. No rash noted.  Psychiatric: He has a normal mood and affect. His behavior is normal. Judgment and thought content normal.   Lab Results  Component Value Date   WBC 7.5 04/23/2011   HGB 13.9 04/23/2011   HCT 39.0 04/23/2011   PLT 133* 04/23/2011   GLUCOSE 109* 04/23/2011   CHOL 194 09/05/2010   TRIG 174.0* 09/05/2010   HDL 31.10* 09/05/2010   LDLDIRECT 115.9 09/08/2009   LDLCALC 128* 09/05/2010   ALT 9 04/23/2011   AST 10 04/23/2011   NA 136 04/23/2011   K 3.6 04/23/2011   CL 103 04/23/2011   CREATININE 0.93 04/23/2011  BUN 10 04/23/2011   CO2 26 04/23/2011   TSH 1.58 09/05/2010   PSA 0.25 09/05/2010   HGBA1C 5.6 04/08/2011          Assessment & Plan:

## 2011-11-05 ENCOUNTER — Institutional Professional Consult (permissible substitution): Payer: Medicare Other | Admitting: Pulmonary Disease

## 2011-11-08 ENCOUNTER — Institutional Professional Consult (permissible substitution): Payer: Medicare Other | Admitting: Pulmonary Disease

## 2011-11-12 ENCOUNTER — Other Ambulatory Visit: Payer: Self-pay | Admitting: *Deleted

## 2011-11-12 DIAGNOSIS — H02409 Unspecified ptosis of unspecified eyelid: Secondary | ICD-10-CM

## 2011-11-12 DIAGNOSIS — R253 Fasciculation: Secondary | ICD-10-CM

## 2011-11-13 ENCOUNTER — Other Ambulatory Visit: Payer: Self-pay | Admitting: *Deleted

## 2011-11-13 DIAGNOSIS — H02409 Unspecified ptosis of unspecified eyelid: Secondary | ICD-10-CM

## 2011-11-13 DIAGNOSIS — R253 Fasciculation: Secondary | ICD-10-CM

## 2011-11-19 ENCOUNTER — Encounter: Payer: Self-pay | Admitting: Pulmonary Disease

## 2011-11-19 ENCOUNTER — Ambulatory Visit (INDEPENDENT_AMBULATORY_CARE_PROVIDER_SITE_OTHER): Payer: Medicare Other | Admitting: Pulmonary Disease

## 2011-11-19 VITALS — BP 116/82 | HR 64 | Temp 98.1°F | Ht 68.0 in | Wt 211.2 lb

## 2011-11-19 DIAGNOSIS — G478 Other sleep disorders: Secondary | ICD-10-CM

## 2011-11-19 DIAGNOSIS — G479 Sleep disorder, unspecified: Secondary | ICD-10-CM

## 2011-11-19 NOTE — Progress Notes (Signed)
Chief Complaint  Patient presents with  . Sleep Consult    Referred by Dr [name]. Epworth Score was 6    History of Present Illness: [name] is a 71 y.o. male for evaluation of sleep apnea.  His wife has notice trouble with his sleep.  He talks and jumps in his sleep.  This happens every night.  He has hit his head before.  He does not have any specific dream recall for this.  He goes to bed at 10 pm.  He has trouble falling asleep and staying asleep.  He tried a sleeping pill, but this didn't help.  He snores, and his wife has seen him stop breathing while asleep. He wakes up after a few hours, and then has trouble falling back to sleep.  He is tired during the day, and can fall asleep easily when sitting quiet.  He feels he sleeps better sitting up in a recliner rather than in bed laying flat.  He drinks one cup of coffee in the morning.  He used to get leg cramps, but this is better after he started potassium supplements.  The patient denies bruxism, or nightmares.  There is no history of restless legs.  The patient denies sleep hallucinations, sleep paralysis, or cataplexy.    Past Medical History  Diagnosis Date  . Hypertension   . Hyperlipidemia   . Benign prostatic hypertrophy   . Insomnia   . UTI (lower urinary tract infection)   . Seasonal allergies     No past surgical history on file.  Current Outpatient Prescriptions on File Prior to Visit  Medication Sig Dispense Refill  . amLODipine-benazepril (LOTREL) 10-20 MG per capsule Take 1 capsule by mouth daily.  90 capsule  3  . aspirin 81 MG tablet Take 81 mg by mouth daily.        . Cholecalciferol (VITAMIN D) 1000 UNIT capsule Take 1,000 Units by mouth daily.        . potassium chloride (KLOR-CON) 8 MEQ tablet Take 1 tablet (8 mEq total) by mouth daily.  30 tablet  11    Allergies  Allergen Reactions  . Atorvastatin     REACTION: leg cramps  . Penicillins   . Sulfonamide Derivatives     family history  includes Diabetes in his mother and other.   reports that he has quit smoking. His smoking use included Cigarettes and Cigars. He has a 7 pack-year smoking history. He does not have any smokeless tobacco history on file. He reports that he drinks alcohol. He reports that he does not use illicit drugs.  Review of Systems  Constitutional: Negative for fever and unexpected weight change.  HENT: Positive for sneezing and sinus pressure. Negative for ear pain, nosebleeds, congestion, sore throat, rhinorrhea, trouble swallowing, dental problem and postnasal drip.   Eyes: Negative for redness and itching.  Respiratory: Positive for cough. Negative for chest tightness, shortness of breath and wheezing.   Cardiovascular: Negative for palpitations and leg swelling.  Gastrointestinal: Negative for nausea and vomiting.  Genitourinary: Negative for dysuria.  Musculoskeletal: Positive for joint swelling.  Skin: Negative for rash.  Neurological: Negative for headaches.  Hematological: Does not bruise/bleed easily.  Psychiatric/Behavioral: Negative for dysphoric mood. The patient is not nervous/anxious.     Physical Exam: BP 116/82  Pulse 64  Temp(Src) 98.1 F (36.7 C) (Oral)  Ht 5\' 8"  (1.727 m)  Wt 211 lb 3.2 oz (95.8 kg)  BMI 32.11 kg/m2  SpO2 94% Body  mass index is 32.11 kg/(m^2).   General - No distress, obese HEENT - Wears glasses, no sinus tenderness, no oral exudate, has dentures, MP 4, no LAN Cardiac - s1s2 regular, no murmur Chest - no wheeze, rales Abdomen - soft, nontender Extremities - no edema Neurologic - normal strength Skin - no rashes Psychiatric - normal mood, behavior  Assessment/Plan:  Outpatient Encounter Prescriptions as of 11/19/2011  Medication Sig Dispense Refill  . amLODipine-benazepril (LOTREL) 10-20 MG per capsule Take 1 capsule by mouth daily.  90 capsule  3  . aspirin 81 MG tablet Take 81 mg by mouth daily.        . Cholecalciferol (VITAMIN D) 1000 UNIT  capsule Take 1,000 Units by mouth daily.        . potassium chloride (KLOR-CON) 8 MEQ tablet Take 1 tablet (8 mEq total) by mouth daily.  30 tablet  11  . DISCONTD: nortriptyline (PAMELOR) 10 MG capsule Take 10 mg by mouth. Take 1-2 by mouth at bedtime for sleep       . DISCONTD: tadalafil (CIALIS) 20 MG tablet Take 20 mg by mouth daily as needed.        [name] DISCONTD: brimonidine-timolol (COMBIGAN) 0.2-0.5 % ophthalmic solution Place 1 drop into both eyes every 12 (twelve) hours.        [name] DISCONTD: travoprost, benzalkonium, (TRAVATAN) 0.004 % ophthalmic solution Place 1 drop into both eyes at bedtime.          [name] Pager:  [phone] 11/19/2011, 3:53 PM

## 2011-11-19 NOTE — Patient Instructions (Signed)
Will schedule sleep study Will call to schedule follow up after sleep study reviewed 

## 2011-11-19 NOTE — Assessment & Plan Note (Signed)
He has snoring, sleep disruption, witnessed apnea, and daytime sleepiness.  He has NREM parasomnias.  He has history of HTN.  Initial concern is for sleep apnea.  I have explained how sleep apnea can affect the patient's health.  Driving precautions and importance of weight loss were discussed.  Treatment options for sleep apnea were reviewed.  Safe sleep environment was emphasized.  Will arrange for in lab sleep study to further assess.

## 2011-11-19 NOTE — Progress Notes (Deleted)
  Subjective:    Patient ID: [name], male    DOB: Sep 02, 1940, 71 y.o.   MRN: [id number]  HPI    Review of Systems  Constitutional: Negative for fever and unexpected weight change.  HENT: Positive for sneezing and sinus pressure. Negative for ear pain, nosebleeds, congestion, sore throat, rhinorrhea, trouble swallowing, dental problem and postnasal drip.   Eyes: Negative for redness and itching.  Respiratory: Positive for cough. Negative for chest tightness, shortness of breath and wheezing.   Cardiovascular: Negative for palpitations and leg swelling.  Gastrointestinal: Negative for nausea and vomiting.  Genitourinary: Negative for dysuria.  Musculoskeletal: Positive for joint swelling.  Skin: Negative for rash.  Neurological: Negative for headaches.  Hematological: Does not bruise/bleed easily.  Psychiatric/Behavioral: Negative for dysphoric mood. The patient is not nervous/anxious.        Objective:   Physical Exam        Assessment & Plan:

## 2011-11-20 ENCOUNTER — Inpatient Hospital Stay (HOSPITAL_COMMUNITY): Admission: RE | Admit: 2011-11-20 | Payer: Medicare Other | Source: Ambulatory Visit

## 2011-11-25 ENCOUNTER — Ambulatory Visit (HOSPITAL_COMMUNITY)
Admission: RE | Admit: 2011-11-25 | Discharge: 2011-11-25 | Disposition: A | Discharge status: Home / Self Care | Payer: Medicare Other | Source: Ambulatory Visit | Attending: Internal Medicine | Admitting: Internal Medicine

## 2011-11-25 DIAGNOSIS — R259 Unspecified abnormal involuntary movements: Secondary | ICD-10-CM | POA: Insufficient documentation

## 2011-11-25 DIAGNOSIS — I10 Essential (primary) hypertension: Secondary | ICD-10-CM | POA: Insufficient documentation

## 2011-11-25 DIAGNOSIS — G319 Degenerative disease of nervous system, unspecified: Secondary | ICD-10-CM | POA: Insufficient documentation

## 2011-11-25 DIAGNOSIS — R253 Fasciculation: Secondary | ICD-10-CM

## 2011-11-25 DIAGNOSIS — H02409 Unspecified ptosis of unspecified eyelid: Secondary | ICD-10-CM | POA: Insufficient documentation

## 2011-11-25 LAB — CREATININE, SERUM
GFR calc Af Amer: >90 mL/min (ref >90)
GFR calc non Af Amer: 82 mL/min — ABNORMAL LOW (ref >90)

## 2011-11-25 MED ORDER — GADOBENATE DIMEGLUMINE 529 MG/ML IV SOLN
20.0000 mL | Freq: Once | INTRAVENOUS | Status: AC | PRN
  Administered 2011-11-25: 20 mL via INTRAVENOUS

## 2011-11-29 ENCOUNTER — Telehealth: Payer: Self-pay | Admitting: Internal Medicine

## 2011-11-29 NOTE — Telephone Encounter (Signed)
Please fax report to Dr Darrick Huntsman - Ophthalmology Misty Stanley, please, inform patient that his brain MRI was ok Thx

## 2011-12-03 NOTE — Telephone Encounter (Signed)
Pt informed. Copy faxed to Dr. Newt Lukes @ [phone].

## 2011-12-10 ENCOUNTER — Inpatient Hospital Stay (HOSPITAL_COMMUNITY): Admission: RE | Admit: 2011-12-10 | Payer: Medicare Other | Source: Ambulatory Visit

## 2011-12-10 ENCOUNTER — Other Ambulatory Visit (HOSPITAL_COMMUNITY): Payer: Self-pay | Admitting: Ophthalmology

## 2011-12-10 DIAGNOSIS — H02403 Unspecified ptosis of bilateral eyelids: Secondary | ICD-10-CM

## 2011-12-11 ENCOUNTER — Telehealth: Payer: Self-pay | Admitting: Internal Medicine

## 2011-12-11 NOTE — Telephone Encounter (Signed)
Misty Stanley, please, inform patient that his brain MRI was ok Thx

## 2011-12-11 NOTE — Telephone Encounter (Signed)
Pt informed

## 2011-12-15 ENCOUNTER — Ambulatory Visit (HOSPITAL_BASED_OUTPATIENT_CLINIC_OR_DEPARTMENT_OTHER): Payer: Medicare Other | Attending: Pulmonary Disease

## 2011-12-15 DIAGNOSIS — G479 Sleep disorder, unspecified: Secondary | ICD-10-CM

## 2011-12-15 DIAGNOSIS — G4710 Hypersomnia, unspecified: Secondary | ICD-10-CM | POA: Insufficient documentation

## 2011-12-15 DIAGNOSIS — G4733 Obstructive sleep apnea (adult) (pediatric): Secondary | ICD-10-CM

## 2011-12-15 DIAGNOSIS — I10 Essential (primary) hypertension: Secondary | ICD-10-CM | POA: Insufficient documentation

## 2011-12-21 ENCOUNTER — Other Ambulatory Visit: Payer: Self-pay | Admitting: Internal Medicine

## 2011-12-22 DIAGNOSIS — G4710 Hypersomnia, unspecified: Secondary | ICD-10-CM

## 2011-12-22 DIAGNOSIS — I10 Essential (primary) hypertension: Secondary | ICD-10-CM

## 2011-12-23 NOTE — Procedures (Signed)
NAMEJIMI, SCHAPPERT NO.:  [id number]  MEDICAL RECORD NO.:  [id number]          PATIENT TYPE:  OUT  LOCATION:  SLEEP CENTER                 FACILITY:  [facility]  PHYSICIAN:  [name], MD        DATE OF BIRTH:  [birth year]  DATE OF STUDY:  12/15/2011                           NOCTURNAL POLYSOMNOGRAM  REFERRING PHYSICIAN:  [name], MD  FACILITY:  [facility]  REFERRING PHYSICIAN:  [name], MD.  INDICATION:  Mr. Zeiter is a 71 year old male, who has a history of hypertension.  He also has snoring, sleep disruption, and daytime sleepiness.  He also reports episodes of difficulty falling asleep and staying asleep.  He also reports episodes of jumping while asleep and talking while asleep.  He is referred to the sleep lab for evaluation of hypersomnia with obstructive sleep apnea, and non-REM parasomnias.  Height is 5 feet 8 inches, weight is 210 pounds.  BMI is 32.  Neck size is 17.5 inches.  MEDICATIONS:  Klor-Con, Lotrel, aspirin.  EPWORTH SLEEPINESS SCORE:  1.  SLEEP ARCHITECTURE:  Total recording time was 361 minutes, total sleep time was 143 minutes.  Sleep efficiency was 39%.  Sleep latency was 107 minutes.  REM latency was 225 minutes.  The study was notable for lack of slow-wave sleep and  reduction in the percentage of REM sleep.  He slept predominantly in the supine position.  RESPIRATORY DATA:  The average respiratory rate was 15.  Mild snoring was noted by the technician.  The overall apnea-hypopnea index was 0.4.  OXYGEN DATA:  The baseline oxygenation was 96%.  The oxygen saturation nadir was 89%.  The study was conducted without the use of supplemental oxygen.  CARDIAC DATA:  The average heart rate was 61 and the rhythm strip showed sinus rhythm.  MOVEMENT PARASOMNIA:  The patient had 5 restroom trips.  The periodic limb movement index was 16.  The patient was noted to have an episode of talking while asleep and EPIC 693.   No other abnormal behaviors were noted.  IMPRESSION:  This study did not show evidence for sleep-disordered breathing as his apnea-hypopnea index was 0.4 and oxygen saturation nadir was 89%.  He did have 1 episode of somniloquy.  He also had an increase in his periodic limb movement index was 16.  No other abnormal behaviors were noted during this study.  He had several trips to the restroom during this study.  Clinical correlation will be necessary to the determine the significance and cause for his nocturia.     [name], MD Diplomat, American Board of Sleep Medicine    VS/MEDQ  D:  12/22/2011 11:54:37  T:  12/22/2011 96:04:54  Job:  [id number]

## 2011-12-24 ENCOUNTER — Telehealth: Payer: Self-pay | Admitting: Pulmonary Disease

## 2011-12-24 DIAGNOSIS — G479 Sleep disorder, unspecified: Secondary | ICD-10-CM

## 2011-12-24 NOTE — Telephone Encounter (Signed)
I spoke with pt and is scheduled to come in 01/06/12 at 9:30

## 2011-12-24 NOTE — Telephone Encounter (Signed)
PSG 12/15/11>>AHI 0.4, SpO2 low 89%, PLMI 16, somniloquy, nocturia.  Will have my nurse schedule ROV to review results.

## 2012-01-01 DIAGNOSIS — G518 Other disorders of facial nerve: Secondary | ICD-10-CM | POA: Insufficient documentation

## 2012-01-01 DIAGNOSIS — H02839 Dermatochalasis of unspecified eye, unspecified eyelid: Secondary | ICD-10-CM | POA: Insufficient documentation

## 2012-01-06 ENCOUNTER — Encounter: Payer: Self-pay | Admitting: Pulmonary Disease

## 2012-01-06 ENCOUNTER — Ambulatory Visit (INDEPENDENT_AMBULATORY_CARE_PROVIDER_SITE_OTHER): Payer: Medicare Other | Admitting: Pulmonary Disease

## 2012-01-06 VITALS — BP 148/82 | HR 65 | Temp 98.3°F | Ht 68.0 in | Wt 214.8 lb

## 2012-01-06 DIAGNOSIS — G4750 Parasomnia, unspecified: Secondary | ICD-10-CM

## 2012-01-06 DIAGNOSIS — IMO0002 Reserved for concepts with insufficient information to code with codable children: Secondary | ICD-10-CM

## 2012-01-06 MED ORDER — CLONAZEPAM 0.5 MG PO TABS: 0.5000 mg | ORAL_TABLET | Freq: Every day | ORAL | Status: DC

## 2012-01-06 NOTE — Patient Instructions (Signed)
Klonopin 0.5 mg one tablet 30 minutes before bedtime Follow up in 4 to 6 weeks

## 2012-01-06 NOTE — Progress Notes (Signed)
Chief Complaint  Patient presents with  . Results    Pt here to discuss sleep study results    History of Present Illness: [name] is a 71 y.o. male with NREM parasomnias.  He is here to review his sleep study.    Past Medical History  Diagnosis Date  . Hypertension   . Hyperlipidemia   . Benign prostatic hypertrophy   . Insomnia   . UTI (lower urinary tract infection)   . Seasonal allergies     No past surgical history on file.  Outpatient Encounter Prescriptions as of 01/06/2012  Medication Sig Dispense Refill  . amLODipine-benazepril (LOTREL) 10-20 MG per capsule Take 1 capsule by mouth daily.  90 capsule  3  . aspirin 81 MG tablet Take 81 mg by mouth daily.        . Cholecalciferol (VITAMIN D) 1000 UNIT capsule Take 1,000 Units by mouth daily.        [name] KLOR-CON 8 MEQ tablet TAKE 1 TABLET (8 MEQ TOTAL) BY MOUTH DAILY.  30 tablet  5    Allergies  Allergen Reactions  . Atorvastatin     REACTION: leg cramps  . Penicillins   . Sulfonamide Derivatives     Physical Exam:  Blood pressure 148/82, pulse 65, temperature 98.3 F (36.8 C), temperature source Oral, height 5\' 8"  (1.727 m), weight 214 lb 12.8 oz (97.433 kg), SpO2 93.00%. Body mass index is 32.66 kg/(m^2). Wt Readings from Last 2 Encounters:  01/06/12 214 lb 12.8 oz (97.433 kg)  11/19/11 211 lb 3.2 oz (95.8 kg)   General - No distress, obese  HEENT - Wears glasses, no sinus tenderness, no oral exudate, has dentures, MP 4, no LAN  Cardiac - s1s2 regular, no murmur  Chest - no wheeze, rales  Abdomen - soft, nontender  Extremities - no edema  Neurologic - normal strength  Skin - no rashes  Psychiatric - normal mood, behavior   PSG 12/15/11>>AHI 0.4, SpO2 low 89%, PLMI 16, somniloquy, nocturia.   Assessment/Plan:  [name], MD [place] [facility]/Sleep Pager:  [phone] 01/06/2012, 9:50 AM

## 2012-01-06 NOTE — Assessment & Plan Note (Signed)
He has NREM parasomnias.  His sleep study otherwise was negative for sleep apnea.  He has mild elevation in period limb movement, but denies symptoms of restless legs.  He had frequent bathroom trips during sleep study, but denies having this happen at home.  Will give him trial of klonopin 0.5 mg qhs 30 minutes before bedtime.  Discussed side effects to monitor for, and to ensure he has enough time to sleep after taking medicines.  Driving precautions also discussed.  Asked him to ensure his wife accompanies him at his next visit to discuss sleep pattern after starting klonopin.

## 2012-01-13 ENCOUNTER — Ambulatory Visit: Payer: Medicare Other | Admitting: Internal Medicine

## 2012-02-10 ENCOUNTER — Encounter: Payer: Self-pay | Admitting: Pulmonary Disease

## 2012-02-10 ENCOUNTER — Ambulatory Visit (INDEPENDENT_AMBULATORY_CARE_PROVIDER_SITE_OTHER): Payer: Medicare Other | Admitting: Pulmonary Disease

## 2012-02-10 VITALS — BP 160/90 | HR 65 | Temp 98.0°F | Ht 68.0 in | Wt 216.2 lb

## 2012-02-10 DIAGNOSIS — G4750 Parasomnia, unspecified: Secondary | ICD-10-CM

## 2012-02-10 DIAGNOSIS — IMO0002 Reserved for concepts with insufficient information to code with codable children: Secondary | ICD-10-CM

## 2012-02-10 MED ORDER — CLONAZEPAM 0.5 MG PO TABS: 0.2500 mg | ORAL_TABLET | Freq: Every day | ORAL | Status: DC

## 2012-02-10 NOTE — Progress Notes (Signed)
Chief Complaint  Patient presents with  . Follow-up    Pt states he has been doing good with the klonopin--taking 1/2 hour before bedtime. he is now sleeping 5-6 hrs at night before he wakes. he has not een talking in his sleep or kicking in his sleep    History of Present Illness: [name] is a 71 y.o. male with NREM parasomnias.  Since starting klonopin he has been sleeping better.  His wife reports that he no longer talks or fights in his sleep.  He feels more rested, and more energetic during the day.  Past Medical History  Diagnosis Date  . Hypertension   . Hyperlipidemia   . Benign prostatic hypertrophy   . Insomnia   . UTI (lower urinary tract infection)   . Seasonal allergies   . Parasomnia 10/14/2011    PSG 12/15/11>>AHI 0.4, SpO2 low 89%, PLMI 16, somnoliloquay, nocturia     No past surgical history on file.  Outpatient Encounter Prescriptions as of 02/10/2012  Medication Sig Dispense Refill  . amLODipine-benazepril (LOTREL) 10-20 MG per capsule Take 1 capsule by mouth daily.  90 capsule  3  . aspirin 81 MG tablet Take 81 mg by mouth daily.        . Cholecalciferol (VITAMIN D) 1000 UNIT capsule Take 1,000 Units by mouth daily.        . clonazePAM (KLONOPIN) 0.5 MG tablet Take 0.5 tablets (0.25 mg total) by mouth at bedtime.  30 tablet  3  . KLOR-CON 8 MEQ tablet TAKE 1 TABLET (8 MEQ TOTAL) BY MOUTH DAILY.  30 tablet  5  . DISCONTD: clonazePAM (KLONOPIN) 0.5 MG tablet Take 1 tablet (0.5 mg total) by mouth at bedtime.  30 tablet  2    Allergies  Allergen Reactions  . Atorvastatin     REACTION: leg cramps  . Penicillins   . Sulfonamide Derivatives     Physical Exam:  Blood pressure 160/90, pulse 65, temperature 98 F (36.7 C), temperature source Oral, height 5\' 8"  (1.727 m), weight 216 lb 3.2 oz (98.068 kg), SpO2 97.00%.  Body mass index is 32.87 kg/(m^2). Wt Readings from Last 2 Encounters:  02/10/12 216 lb 3.2 oz (98.068 kg)  01/06/12 214 lb 12.8 oz  (97.433 kg)   General - No distress, obese  HEENT - Wears glasses, no sinus tenderness, no oral exudate, has dentures, MP 4, no LAN  Cardiac - s1s2 regular, no murmur  Chest - no wheeze, rales  Abdomen - soft, nontender  Extremities - no edema  Neurologic - normal strength  Skin - no rashes  Psychiatric - normal mood, behavior  Assessment/Plan:  [name], MD [place] [facility]/Critical [facility]/Sleep Pager:  [phone] 02/10/2012, 11:11 AM

## 2012-02-10 NOTE — Patient Instructions (Signed)
Try taking 0.25 mg klonopin nightly Follow up in 4 months

## 2012-02-10 NOTE — Assessment & Plan Note (Signed)
He has NREM parasomnia.  He has done well with klonopin.  Will have him decrease dose to 0.25 mg qhs.

## 2012-02-12 ENCOUNTER — Ambulatory Visit (INDEPENDENT_AMBULATORY_CARE_PROVIDER_SITE_OTHER): Payer: Medicare Other | Admitting: Internal Medicine

## 2012-02-12 ENCOUNTER — Encounter: Payer: Self-pay | Admitting: Internal Medicine

## 2012-02-12 VITALS — BP 140/80 | HR 76 | Temp 97.7°F | Resp 16 | Wt 214.0 lb

## 2012-02-12 DIAGNOSIS — IMO0002 Reserved for concepts with insufficient information to code with codable children: Secondary | ICD-10-CM

## 2012-02-12 DIAGNOSIS — R259 Unspecified abnormal involuntary movements: Secondary | ICD-10-CM

## 2012-02-12 DIAGNOSIS — N529 Male erectile dysfunction, unspecified: Secondary | ICD-10-CM

## 2012-02-12 DIAGNOSIS — I10 Essential (primary) hypertension: Secondary | ICD-10-CM

## 2012-02-12 DIAGNOSIS — R253 Fasciculation: Secondary | ICD-10-CM

## 2012-02-12 DIAGNOSIS — G4750 Parasomnia, unspecified: Secondary | ICD-10-CM

## 2012-02-12 NOTE — Assessment & Plan Note (Signed)
Better  

## 2012-02-12 NOTE — Progress Notes (Signed)
Patient ID: TRIPTON NED, male   DOB: 1940/11/04, 71 y.o.   MRN: [id number]  Subjective:    Patient ID: [name], male    DOB: 09-Apr-1941, 71 y.o.   MRN: [id number]  HPI Dr Kerry [facility]) saw the pt for his R eye blinking x 12 mo; his R eyelid is drooping... - better after Botox inj F/u parasomnia - better on klonopin  The patient presents for a follow-up of  chronic hypertension, elev glucose, hypokalemia controlled with medicines and diet.   Review of Systems  Constitutional: Negative for appetite change, fatigue and unexpected weight change.  HENT: Negative for nosebleeds, congestion, sore throat, sneezing, trouble swallowing and neck pain.   Eyes: Negative for itching and visual disturbance.  Respiratory: Negative for cough.   Cardiovascular: Negative for chest pain, palpitations and leg swelling.  Gastrointestinal: Negative for diarrhea, blood in stool and abdominal distention.  Genitourinary: Negative for frequency and hematuria.  Musculoskeletal: Negative for back pain, joint swelling and gait problem.  Skin: Negative for rash.  Neurological: Negative for dizziness, tremors, speech difficulty and weakness.  Psychiatric/Behavioral: Negative for disturbed wake/sleep cycle, dysphoric mood and agitation. The patient is not nervous/anxious.    Wt Readings from Last 3 Encounters:  02/12/12 214 lb (97.07 kg)  02/10/12 216 lb 3.2 oz (98.068 kg)  01/06/12 214 lb 12.8 oz (97.433 kg)   BP Readings from Last 3 Encounters:  02/12/12 140/80  02/10/12 160/90  01/06/12 148/82        Objective:   Physical Exam  Constitutional: He is oriented to person, place, and time. He appears well-developed.  HENT:  Mouth/Throat: Oropharynx is clear and moist.  Eyes: Conjunctivae are normal. Pupils are equal, round, and reactive to light. Right eye exhibits no discharge. Left eye exhibits no discharge.       B ptosis  Neck: Normal range of motion. No JVD present. No thyromegaly present.    Cardiovascular: Normal rate, regular rhythm, normal heart sounds and intact distal pulses.  Exam reveals no gallop and no friction rub.   No murmur heard. Pulmonary/Chest: Effort normal and breath sounds normal. No respiratory distress. He has no wheezes. He has no rales. He exhibits no tenderness.  Abdominal: Soft. Bowel sounds are normal. He exhibits no distension and no mass. There is no tenderness. There is no rebound and no guarding.  Musculoskeletal: Normal range of motion. He exhibits no edema and no tenderness.  Lymphadenopathy:    He has no cervical adenopathy.  Neurological: He is alert and oriented to person, place, and time. He has normal reflexes. A cranial nerve deficit is present. He exhibits normal muscle tone. Coordination normal.       Mild R facial droop No R eyelid twitching  Skin: Skin is warm and dry. No rash noted.  Psychiatric: He has a normal mood and affect. His behavior is normal. Judgment and thought content normal.   Lab Results  Component Value Date   WBC 7.5 04/23/2011   HGB 13.9 04/23/2011   HCT 39.0 04/23/2011   PLT 133* 04/23/2011   GLUCOSE 109* 04/23/2011   CHOL 194 09/05/2010   TRIG 174.0* 09/05/2010   HDL 31.10* 09/05/2010   LDLDIRECT 115.9 09/08/2009   LDLCALC 128* 09/05/2010   ALT 9 04/23/2011   AST 10 04/23/2011   NA 136 04/23/2011   K 3.6 04/23/2011   CL 103 04/23/2011   CREATININE 0.95 11/25/2011   BUN 10 04/23/2011   CO2 26 04/23/2011   TSH  1.58 09/05/2010   PSA 0.25 09/05/2010   HGBA1C 5.6 04/08/2011          Assessment & Plan:

## 2012-02-12 NOTE — Assessment & Plan Note (Signed)
Resolved on botox inj

## 2012-02-12 NOTE — Assessment & Plan Note (Signed)
No change 

## 2012-02-12 NOTE — Assessment & Plan Note (Addendum)
Cont Klonopin - better

## 2012-06-09 ENCOUNTER — Encounter: Payer: Self-pay | Admitting: Pulmonary Disease

## 2012-06-09 ENCOUNTER — Ambulatory Visit (INDEPENDENT_AMBULATORY_CARE_PROVIDER_SITE_OTHER): Payer: Medicare Other | Admitting: Pulmonary Disease

## 2012-06-09 VITALS — BP 140/82 | HR 68 | Temp 98.0°F | Ht 68.0 in | Wt 215.2 lb

## 2012-06-09 DIAGNOSIS — IMO0002 Reserved for concepts with insufficient information to code with codable children: Secondary | ICD-10-CM

## 2012-06-09 DIAGNOSIS — G4750 Parasomnia, unspecified: Secondary | ICD-10-CM

## 2012-06-09 NOTE — Patient Instructions (Signed)
Follow-up in one year.

## 2012-06-09 NOTE — Progress Notes (Signed)
Chief Complaint  Patient presents with  . Follow-up    pt still taking klonopin 0.5 mg everynight. pt is having to get up 4 times at night to go the the BR. He reports he sleeps well for about 3-4 hrs and thenit is hard to go back to sleep;    History of Present Illness: [name] is a 71 y.o. male with NREM parasomnias.  He tried decreasing dose of klonopin.  He started talking in his sleep more, and kicking more.  He went back up to 0.5 mg klonopin qhs, and has been sleeping better.  He sleeps for 2 hours on his recliner watching TV.  He goes to bed at 12 am.  He sleeps for 3 hours, then gets up to use the bathroom.  He will sleep off and on again for about 2 more hours.  He gets out of bed at 630 am.  He will sometimes nap during the day again, depending on his schedule.  He denies headache, tremor, blurred vision, paresthesias, or weakness.  TESTS: PSG 12/15/11>>AHI 0.4, SpO2 low 89%, PLMI 16, somnoliloquay, nocturia  Past Medical History  Diagnosis Date  . Hypertension   . Hyperlipidemia   . Benign prostatic hypertrophy   . Insomnia   . UTI (lower urinary tract infection)   . Seasonal allergies   . Parasomnia 10/14/2011    PSG 12/15/11>>AHI 0.4, SpO2 low 89%, PLMI 16, somnoliloquay, nocturia     No past surgical history on file.  Outpatient Encounter Prescriptions as of 06/09/2012  Medication Sig Dispense Refill  . amLODipine-benazepril (LOTREL) 10-20 MG per capsule Take 1 capsule by mouth daily.  90 capsule  3  . aspirin 81 MG tablet Take 81 mg by mouth daily.        . Cholecalciferol (VITAMIN D) 1000 UNIT capsule Take 1,000 Units by mouth daily.        . clonazePAM (KLONOPIN) 0.5 MG tablet Take 0.5 mg by mouth at bedtime.      [name] KLOR-CON 8 MEQ tablet TAKE 1 TABLET (8 MEQ TOTAL) BY MOUTH DAILY.  30 tablet  5  . [DISCONTINUED] clonazePAM (KLONOPIN) 0.5 MG tablet Take 0.5 tablets (0.25 mg total) by mouth at bedtime.  30 tablet  3    Allergies  Allergen Reactions  .  Atorvastatin     REACTION: leg cramps  . Penicillins   . Sulfonamide Derivatives     Physical Exam:  Filed Vitals:   06/09/12 1136 06/09/12 1138  BP:  140/82  Pulse:  68  Temp: 98 F (36.7 C)   TempSrc: Oral   Height: 5\' 8"  (1.727 m)   Weight: 215 lb 3.2 oz (97.614 kg)   SpO2:  96%     Current Encounter SPO2  06/09/12 1138 96%  02/10/12 1049 97%  01/06/12 0929 93%     Body mass index is 32.72 kg/(m^2).   Wt Readings from Last 2 Encounters:  06/09/12 215 lb 3.2 oz (97.614 kg)  02/12/12 214 lb (97.07 kg)     General - No distress ENT - No sinus tenderness, no oral exudate, no LAN Cardiac - s1s2 regular, no murmur Chest - No wheeze/rales/dullness Back - No focal tenderness Abd - Soft, non-tender Ext - No edema Neuro - Normal strength, CN intact Skin - No rashes Psych - normal mood, and behavior   Assessment/Plan:  [name], MD [facility] Pager:  [phone] 06/09/2012, 11:57 AM

## 2012-06-09 NOTE — Assessment & Plan Note (Signed)
He was not able to tolerate change to lower dose of klonopin.  He is to continue klonopin 0.5 mg qhs.

## 2012-06-10 ENCOUNTER — Encounter: Payer: Self-pay | Admitting: Internal Medicine

## 2012-06-10 ENCOUNTER — Ambulatory Visit (INDEPENDENT_AMBULATORY_CARE_PROVIDER_SITE_OTHER): Payer: Medicare Other | Admitting: Internal Medicine

## 2012-06-10 ENCOUNTER — Other Ambulatory Visit (INDEPENDENT_AMBULATORY_CARE_PROVIDER_SITE_OTHER): Payer: Medicare Other

## 2012-06-10 VITALS — BP 164/94 | HR 80 | Temp 98.6°F | Resp 16 | Wt 214.0 lb

## 2012-06-10 DIAGNOSIS — E785 Hyperlipidemia, unspecified: Secondary | ICD-10-CM

## 2012-06-10 DIAGNOSIS — R7309 Other abnormal glucose: Secondary | ICD-10-CM

## 2012-06-10 DIAGNOSIS — I10 Essential (primary) hypertension: Secondary | ICD-10-CM

## 2012-06-10 DIAGNOSIS — N400 Enlarged prostate without lower urinary tract symptoms: Secondary | ICD-10-CM

## 2012-06-10 DIAGNOSIS — N419 Inflammatory disease of prostate, unspecified: Secondary | ICD-10-CM

## 2012-06-10 DIAGNOSIS — R7303 Prediabetes: Secondary | ICD-10-CM

## 2012-06-10 HISTORY — DX: Prediabetes: R73.03

## 2012-06-10 LAB — BASIC METABOLIC PANEL
BUN: 11 mg/dL (ref 6–23)
Calcium: 9.2 mg/dL (ref 8.4–10.5)
GFR: 95.42 mL/min (ref >60.00)
Potassium: 3.9 mEq/L (ref 3.5–5.1)
Sodium: 139 mEq/L (ref 135–145)

## 2012-06-10 LAB — URINALYSIS
Bilirubin Urine: NEGATIVE
Hgb urine dipstick: NEGATIVE
Ketones, ur: NEGATIVE
Leukocytes, UA: NEGATIVE
Specific Gravity, Urine: 1.02 (ref 1.000–1.030)
Urobilinogen, UA: 1 (ref 0.0–1.0)

## 2012-06-10 LAB — CBC WITH DIFFERENTIAL/PLATELET
Eosinophils Absolute: 0.1 10*3/uL (ref 0.0–0.7)
HCT: 45.2 % (ref 39.0–52.0)
Lymphs Abs: 2 10*3/uL (ref 0.7–4.0)
MCHC: 34.5 g/dL (ref 30.0–36.0)
MCV: 86.8 fl (ref 78.0–100.0)
Monocytes Absolute: 0.5 10*3/uL (ref 0.1–1.0)
Neutrophils Relative %: 35.9 % — ABNORMAL LOW (ref 43.0–77.0)
Platelets: 192 10*3/uL (ref 150.0–400.0)
RDW: 12.7 % (ref 11.5–14.6)

## 2012-06-10 LAB — HEMOGLOBIN A1C: Hgb A1c MFr Bld: 5.9 % (ref 4.6–6.5)

## 2012-06-10 MED ORDER — TAMSULOSIN HCL 0.4 MG PO CAPS: 0.4000 mg | ORAL_CAPSULE | Freq: Every day | ORAL | Status: DC

## 2012-06-10 NOTE — Assessment & Plan Note (Signed)
UA

## 2012-06-10 NOTE — Progress Notes (Signed)
Subjective:    Patient ID: [name], male    DOB: 07-11-1940, 71 y.o.   MRN: [id number]  HPI  Dr Kerry [facility]) saw the pt for his R eye blinking x 12 mo; his R eyelid is drooping... - better after Botox inj F/u parasomnia - better on klonopin C/o BPH sx's  The patient presents for a follow-up of  chronic hypertension, elev glucose, hypokalemia controlled with medicines and diet.   Review of Systems  Constitutional: Negative for appetite change, fatigue and unexpected weight change.  HENT: Negative for nosebleeds, congestion, sore throat, sneezing, trouble swallowing and neck pain.   Eyes: Negative for itching and visual disturbance.  Respiratory: Negative for cough.   Cardiovascular: Negative for chest pain, palpitations and leg swelling.  Gastrointestinal: Negative for diarrhea, blood in stool and abdominal distention.  Genitourinary: Negative for frequency and hematuria.  Musculoskeletal: Negative for back pain, joint swelling and gait problem.  Skin: Negative for rash.  Neurological: Negative for dizziness, tremors, speech difficulty and weakness.  Psychiatric/Behavioral: Negative for sleep disturbance, dysphoric mood and agitation. The patient is not nervous/anxious.    Wt Readings from Last 3 Encounters:  06/10/12 214 lb (97.07 kg)  06/09/12 215 lb 3.2 oz (97.614 kg)  02/12/12 214 lb (97.07 kg)   BP Readings from Last 3 Encounters:  06/10/12 164/94  06/09/12 140/82  02/12/12 140/80        Objective:   Physical Exam  Constitutional: He is oriented to person, place, and time. He appears well-developed.  HENT:  Mouth/Throat: Oropharynx is clear and moist.  Eyes: Conjunctivae normal are normal. Pupils are equal, round, and reactive to light. Right eye exhibits no discharge. Left eye exhibits no discharge.       B ptosis  Neck: Normal range of motion. No JVD present. No thyromegaly present.  Cardiovascular: Normal rate, regular rhythm, normal heart sounds and  intact distal pulses.  Exam reveals no gallop and no friction rub.   No murmur heard. Pulmonary/Chest: Effort normal and breath sounds normal. No respiratory distress. He has no wheezes. He has no rales. He exhibits no tenderness.  Abdominal: Soft. Bowel sounds are normal. He exhibits no distension and no mass. There is no tenderness. There is no rebound and no guarding.  Musculoskeletal: Normal range of motion. He exhibits no edema and no tenderness.  Lymphadenopathy:    He has no cervical adenopathy.  Neurological: He is alert and oriented to person, place, and time. He has normal reflexes. A cranial nerve deficit is present. He exhibits normal muscle tone. Coordination normal.       Mild R facial droop No R eyelid twitching  Skin: Skin is warm and dry. No rash noted.  Psychiatric: He has a normal mood and affect. His behavior is normal. Judgment and thought content normal.   Lab Results  Component Value Date   WBC 7.5 04/23/2011   HGB 13.9 04/23/2011   HCT 39.0 04/23/2011   PLT 133* 04/23/2011   GLUCOSE 109* 04/23/2011   CHOL 194 09/05/2010   TRIG 174.0* 09/05/2010   HDL 31.10* 09/05/2010   LDLDIRECT 115.9 09/08/2009   LDLCALC 128* 09/05/2010   ALT 9 04/23/2011   AST 10 04/23/2011   NA 136 04/23/2011   K 3.6 04/23/2011   CL 103 04/23/2011   CREATININE 0.95 11/25/2011   BUN 10 04/23/2011   CO2 26 04/23/2011   TSH 1.58 09/05/2010   PSA 0.25 09/05/2010   HGBA1C 5.6 04/08/2011  Assessment & Plan:

## 2012-06-10 NOTE — Assessment & Plan Note (Signed)
Continue with current prescription therapy as reflected on the Med list. BP Readings from Last 3 Encounters:  06/10/12 164/94  06/09/12 140/82  02/12/12 140/80

## 2012-06-10 NOTE — Assessment & Plan Note (Signed)
Diet 

## 2012-06-10 NOTE — Assessment & Plan Note (Signed)
Start Flomax

## 2012-06-11 LAB — TSH: TSH: 1.94 u[IU]/mL (ref 0.35–5.50)

## 2012-06-25 ENCOUNTER — Other Ambulatory Visit: Payer: Self-pay | Admitting: Internal Medicine

## 2012-07-01 ENCOUNTER — Other Ambulatory Visit: Payer: Self-pay | Admitting: Internal Medicine

## 2012-09-09 ENCOUNTER — Encounter: Payer: Self-pay | Admitting: Internal Medicine

## 2012-09-09 ENCOUNTER — Ambulatory Visit (INDEPENDENT_AMBULATORY_CARE_PROVIDER_SITE_OTHER): Payer: Medicare Other | Admitting: Internal Medicine

## 2012-09-09 VITALS — BP 140/80 | HR 80 | Temp 97.8°F | Resp 16 | Wt 218.0 lb

## 2012-09-09 DIAGNOSIS — I10 Essential (primary) hypertension: Secondary | ICD-10-CM

## 2012-09-09 MED ORDER — TAMSULOSIN HCL 0.4 MG PO CAPS: 0.4000 mg | ORAL_CAPSULE | Freq: Every day | ORAL | Status: DC

## 2012-09-09 MED ORDER — POTASSIUM CHLORIDE ER 8 MEQ PO TBCR: 8.0000 meq | EXTENDED_RELEASE_TABLET | Freq: Every day | ORAL | Status: DC

## 2012-09-09 MED ORDER — AMLODIPINE BESY-BENAZEPRIL HCL 10-20 MG PO CAPS: 1.0000 | ORAL_CAPSULE | Freq: Every day | ORAL | Status: DC

## 2012-09-09 MED ORDER — VITAMIN D 1000 UNITS PO CAPS: 1000.0000 [IU] | ORAL_CAPSULE | Freq: Every day | ORAL | Status: DC

## 2012-09-09 NOTE — Assessment & Plan Note (Signed)
Continue with current prescription therapy as reflected on the Med list.  

## 2012-09-09 NOTE — Assessment & Plan Note (Signed)
Wt Readings from Last 3 Encounters:  09/09/12 218 lb (98.884 kg)  06/10/12 214 lb (97.07 kg)  06/09/12 215 lb 3.2 oz (97.614 kg)   Labs

## 2012-09-09 NOTE — Progress Notes (Signed)
Patient ID: [name], male   DOB: 1940/10/06, 72 y.o.   MRN: [id number]  Subjective:     HPI  Dr Kerry [facility]) saw the pt for his R eye blinking x 12 mo; his R eyelid is drooping... - better after Botox inj  F/u parasomnia - better on klonopin  F/u BPH sx's he saw a urologist  The patient presents for a follow-up of  chronic hypertension, elev glucose, hypokalemia controlled with medicines and diet.   Review of Systems  Constitutional: Negative for appetite change, fatigue and unexpected weight change.  HENT: Negative for nosebleeds, congestion, sore throat, sneezing, trouble swallowing and neck pain.   Eyes: Negative for itching and visual disturbance.  Respiratory: Negative for cough.   Cardiovascular: Negative for chest pain, palpitations and leg swelling.  Gastrointestinal: Negative for diarrhea, blood in stool and abdominal distention.  Genitourinary: Negative for frequency and hematuria.  Musculoskeletal: Negative for back pain, joint swelling and gait problem.  Skin: Negative for rash.  Neurological: Negative for dizziness, tremors, speech difficulty and weakness.  Psychiatric/Behavioral: Negative for sleep disturbance, dysphoric mood and agitation. The patient is not nervous/anxious.    Wt Readings from Last 3 Encounters:  09/09/12 218 lb (98.884 kg)  06/10/12 214 lb (97.07 kg)  06/09/12 215 lb 3.2 oz (97.614 kg)   BP Readings from Last 3 Encounters:  09/09/12 140/80  06/10/12 164/94  06/09/12 140/82        Objective:   Physical Exam  Constitutional: He is oriented to person, place, and time. He appears well-developed.  HENT:  Mouth/Throat: Oropharynx is clear and moist.  Eyes: Conjunctivae are normal. Pupils are equal, round, and reactive to light. Right eye exhibits no discharge. Left eye exhibits no discharge.  B ptosis  Neck: Normal range of motion. No JVD present. No thyromegaly present.  Cardiovascular: Normal rate, regular rhythm, normal heart  sounds and intact distal pulses.  Exam reveals no gallop and no friction rub.   No murmur heard. Pulmonary/Chest: Effort normal and breath sounds normal. No respiratory distress. He has no wheezes. He has no rales. He exhibits no tenderness.  Abdominal: Soft. Bowel sounds are normal. He exhibits no distension and no mass. There is no tenderness. There is no rebound and no guarding.  Musculoskeletal: Normal range of motion. He exhibits no edema and no tenderness.  Lymphadenopathy:    He has no cervical adenopathy.  Neurological: He is alert and oriented to person, place, and time. He has normal reflexes. A cranial nerve deficit is present. He exhibits normal muscle tone. Coordination normal.  Mild R facial droop No R eyelid twitching  Skin: Skin is warm and dry. No rash noted.  Psychiatric: He has a normal mood and affect. His behavior is normal. Judgment and thought content normal.   Lab Results  Component Value Date   WBC 4.0* 06/10/2012   HGB 15.6 06/10/2012   HCT 45.2 06/10/2012   PLT 192.0 06/10/2012   GLUCOSE 129* 06/10/2012   CHOL 194 09/05/2010   TRIG 174.0* 09/05/2010   HDL 31.10* 09/05/2010   LDLDIRECT 115.9 09/08/2009   LDLCALC 128* 09/05/2010   ALT 9 04/23/2011   AST 10 04/23/2011   NA 139 06/10/2012   K 3.9 06/10/2012   CL 102 06/10/2012   CREATININE 1.0 06/10/2012   BUN 11 06/10/2012   CO2 31 06/10/2012   TSH 1.94 06/10/2012   PSA 0.22 06/10/2012   HGBA1C 5.9 06/10/2012  Assessment & Plan:

## 2012-09-30 ENCOUNTER — Encounter: Payer: Self-pay | Admitting: Gastroenterology

## 2012-09-30 ENCOUNTER — Other Ambulatory Visit: Payer: Self-pay | Admitting: Pulmonary Disease

## 2012-09-30 NOTE — Telephone Encounter (Signed)
Last OV 06/09/2012 No pending OV  Last fill was 06/09/12 #30 with 3 additional refills.  Please advise if this is okay to refill. Thanks.

## 2013-01-11 ENCOUNTER — Ambulatory Visit (INDEPENDENT_AMBULATORY_CARE_PROVIDER_SITE_OTHER): Payer: Medicare Other | Admitting: Internal Medicine

## 2013-01-11 ENCOUNTER — Encounter: Payer: Self-pay | Admitting: Internal Medicine

## 2013-01-11 VITALS — BP 138/90 | HR 72 | Temp 98.0°F | Resp 16 | Wt 213.0 lb

## 2013-01-11 DIAGNOSIS — I10 Essential (primary) hypertension: Secondary | ICD-10-CM

## 2013-01-11 DIAGNOSIS — Z0000 Encounter for general adult medical examination without abnormal findings: Secondary | ICD-10-CM

## 2013-01-11 DIAGNOSIS — E785 Hyperlipidemia, unspecified: Secondary | ICD-10-CM

## 2013-01-11 DIAGNOSIS — N419 Inflammatory disease of prostate, unspecified: Secondary | ICD-10-CM

## 2013-01-11 DIAGNOSIS — N400 Enlarged prostate without lower urinary tract symptoms: Secondary | ICD-10-CM

## 2013-01-11 DIAGNOSIS — N320 Bladder-neck obstruction: Secondary | ICD-10-CM

## 2013-01-11 MED ORDER — CLONAZEPAM 0.5 MG PO TABS: 0.5000 mg | ORAL_TABLET | Freq: Every evening | ORAL | Status: DC | PRN

## 2013-01-11 NOTE — Assessment & Plan Note (Signed)
On diet Lost wt 

## 2013-01-11 NOTE — Assessment & Plan Note (Signed)
Doing well 

## 2013-01-11 NOTE — Assessment & Plan Note (Signed)
Continue with current prescription therapy as reflected on the Med list.  

## 2013-01-11 NOTE — Progress Notes (Signed)
   Subjective:     HPI    F/u parasomnia - better on klonopin  F/u BPH sx's he saw a urologist  The patient presents for a follow-up of  chronic hypertension, elev glucose, hypokalemia controlled with medicines and diet.  Dr Kerry [facility]) saw the pt for his R eye blinking x 12 mo; his R eyelid is drooping... - better after Botox inj  Review of Systems  Constitutional: Negative for appetite change, fatigue and unexpected weight change.  HENT: Negative for nosebleeds, congestion, sore throat, sneezing, trouble swallowing and neck pain.   Eyes: Negative for itching and visual disturbance.  Respiratory: Negative for cough.   Cardiovascular: Negative for chest pain, palpitations and leg swelling.  Gastrointestinal: Negative for diarrhea, blood in stool and abdominal distention.  Genitourinary: Negative for frequency and hematuria.  Musculoskeletal: Negative for back pain, joint swelling and gait problem.  Skin: Negative for rash.  Neurological: Negative for dizziness, tremors, speech difficulty and weakness.  Psychiatric/Behavioral: Negative for sleep disturbance, dysphoric mood and agitation. The patient is not nervous/anxious.    Wt Readings from Last 3 Encounters:  01/11/13 213 lb (96.616 kg)  09/09/12 218 lb (98.884 kg)  06/10/12 214 lb (97.07 kg)   BP Readings from Last 3 Encounters:  01/11/13 138/90  09/09/12 140/80  06/10/12 164/94        Objective:   Physical Exam  Constitutional: He is oriented to person, place, and time. He appears well-developed.  HENT:  Mouth/Throat: Oropharynx is clear and moist.  Eyes: Conjunctivae are normal. Pupils are equal, round, and reactive to light. Right eye exhibits no discharge. Left eye exhibits no discharge.  B ptosis  Neck: Normal range of motion. No JVD present. No thyromegaly present.  Cardiovascular: Normal rate, regular rhythm, normal heart sounds and intact distal pulses.  Exam reveals no gallop and no friction rub.   No  murmur heard. Pulmonary/Chest: Effort normal and breath sounds normal. No respiratory distress. He has no wheezes. He has no rales. He exhibits no tenderness.  Abdominal: Soft. Bowel sounds are normal. He exhibits no distension and no mass. There is no tenderness. There is no rebound and no guarding.  Musculoskeletal: Normal range of motion. He exhibits no edema and no tenderness.  Lymphadenopathy:    He has no cervical adenopathy.  Neurological: He is alert and oriented to person, place, and time. He has normal reflexes. A cranial nerve deficit is present. He exhibits normal muscle tone. Coordination normal.  Mild R facial droop No R eyelid twitching  Skin: Skin is warm and dry. No rash noted.  Psychiatric: He has a normal mood and affect. His behavior is normal. Judgment and thought content normal.   Lab Results  Component Value Date   WBC 4.0* 06/10/2012   HGB 15.6 06/10/2012   HCT 45.2 06/10/2012   PLT 192.0 06/10/2012   GLUCOSE 129* 06/10/2012   CHOL 194 09/05/2010   TRIG 174.0* 09/05/2010   HDL 31.10* 09/05/2010   LDLDIRECT 115.9 09/08/2009   LDLCALC 128* 09/05/2010   ALT 9 04/23/2011   AST 10 04/23/2011   NA 139 06/10/2012   K 3.9 06/10/2012   CL 102 06/10/2012   CREATININE 1.0 06/10/2012   BUN 11 06/10/2012   CO2 31 06/10/2012   TSH 1.94 06/10/2012   PSA 0.22 06/10/2012   HGBA1C 5.9 06/10/2012          Assessment & Plan:

## 2013-07-02 ENCOUNTER — Other Ambulatory Visit: Payer: Self-pay | Admitting: Internal Medicine

## 2013-07-06 ENCOUNTER — Telehealth: Payer: Self-pay | Admitting: Pulmonary Disease

## 2013-07-06 NOTE — Telephone Encounter (Signed)
called patient to make apt x3. No return call back. Sent letter 1/13. °

## 2013-07-07 ENCOUNTER — Other Ambulatory Visit: Payer: Self-pay | Admitting: Internal Medicine

## 2013-07-08 ENCOUNTER — Other Ambulatory Visit: Payer: Self-pay | Admitting: Internal Medicine

## 2013-07-14 ENCOUNTER — Other Ambulatory Visit: Payer: Self-pay | Admitting: Internal Medicine

## 2013-07-14 ENCOUNTER — Other Ambulatory Visit (INDEPENDENT_AMBULATORY_CARE_PROVIDER_SITE_OTHER): Payer: Medicare Other

## 2013-07-14 DIAGNOSIS — E785 Hyperlipidemia, unspecified: Secondary | ICD-10-CM

## 2013-07-14 DIAGNOSIS — I10 Essential (primary) hypertension: Secondary | ICD-10-CM

## 2013-07-14 DIAGNOSIS — Z0000 Encounter for general adult medical examination without abnormal findings: Secondary | ICD-10-CM

## 2013-07-14 DIAGNOSIS — N400 Enlarged prostate without lower urinary tract symptoms: Secondary | ICD-10-CM

## 2013-07-14 DIAGNOSIS — N320 Bladder-neck obstruction: Secondary | ICD-10-CM

## 2013-07-14 LAB — BASIC METABOLIC PANEL
BUN: 12 mg/dL (ref 6–23)
CALCIUM: 8.9 mg/dL (ref 8.4–10.5)
CO2: 28 mEq/L (ref 19–32)
Chloride: 103 mEq/L (ref 96–112)
Creatinine, Ser: 1 mg/dL (ref 0.4–1.5)
GFR: 91.91 mL/min (ref >60.00)
Glucose, Bld: 119 mg/dL — ABNORMAL HIGH (ref 70–99)
POTASSIUM: 3.3 meq/L — AB (ref 3.5–5.1)
SODIUM: 138 meq/L (ref 135–145)

## 2013-07-14 LAB — TSH: TSH: 2.4 u[IU]/mL (ref 0.35–5.50)

## 2013-07-14 LAB — CBC WITH DIFFERENTIAL/PLATELET
BASOS ABS: 0 10*3/uL (ref 0.0–0.1)
Basophils Relative: 0.6 % (ref 0.0–3.0)
EOS PCT: 1.8 % (ref 0.0–5.0)
Eosinophils Absolute: 0.1 10*3/uL (ref 0.0–0.7)
HCT: 43.7 % (ref 39.0–52.0)
HEMOGLOBIN: 15.3 g/dL (ref 13.0–17.0)
LYMPHS PCT: 32.9 % (ref 12.0–46.0)
Lymphs Abs: 1.6 10*3/uL (ref 0.7–4.0)
MCHC: 35.1 g/dL (ref 30.0–36.0)
MCV: 86 fl (ref 78.0–100.0)
Monocytes Absolute: 0.4 10*3/uL (ref 0.1–1.0)
Monocytes Relative: 8.8 % (ref 3.0–12.0)
Neutro Abs: 2.6 10*3/uL (ref 1.4–7.7)
Neutrophils Relative %: 55.9 % (ref 43.0–77.0)
PLATELETS: 195 10*3/uL (ref 150.0–400.0)
RBC: 5.09 Mil/uL (ref 4.22–5.81)
RDW: 12.9 % (ref 11.5–14.6)
WBC: 4.7 10*3/uL (ref 4.5–10.5)

## 2013-07-14 LAB — URINALYSIS
HGB URINE DIPSTICK: NEGATIVE
Leukocytes, UA: NEGATIVE
NITRITE: NEGATIVE
Specific Gravity, Urine: 1.025 (ref 1.000–1.030)
TOTAL PROTEIN, URINE-UPE24: NEGATIVE
URINE GLUCOSE: NEGATIVE
UROBILINOGEN UA: 1 (ref 0.0–1.0)
pH: 6 (ref 5.0–8.0)

## 2013-07-14 LAB — PSA: PSA: 0.26 ng/mL (ref 0.10–4.00)

## 2013-07-14 LAB — HEPATIC FUNCTION PANEL
ALT: 25 U/L (ref 0–53)
AST: 21 U/L (ref 0–37)
Albumin: 3.7 g/dL (ref 3.5–5.2)
Alkaline Phosphatase: 84 U/L (ref 39–117)
BILIRUBIN TOTAL: 1.1 mg/dL (ref 0.3–1.2)
Bilirubin, Direct: 0.1 mg/dL (ref 0.0–0.3)
Total Protein: 6.3 g/dL (ref 6.0–8.3)

## 2013-07-15 LAB — NMR LIPOPROFILE WITHOUT LIPIDS
HDL Particle Number: 28.4 umol/L — ABNORMAL LOW (ref >=30.5)
HDL SIZE: 8.1 nm — AB (ref >=9.2)
LDL Particle Number: 2034 nmol/L — ABNORMAL HIGH (ref <1000)
LDL Size: 19.8 nm — ABNORMAL LOW (ref >20.5)
LP-IR Score: 50 — ABNORMAL HIGH (ref <=45)
Large HDL-P: <1.3 umol/L — ABNORMAL LOW (ref >=4.8)
Small LDL Particle Number: 1569 nmol/L — ABNORMAL HIGH (ref <=527)
VLDL SIZE: 40.4 nm (ref <=46.6)

## 2013-07-16 ENCOUNTER — Encounter: Payer: Self-pay | Admitting: Internal Medicine

## 2013-07-16 ENCOUNTER — Ambulatory Visit (INDEPENDENT_AMBULATORY_CARE_PROVIDER_SITE_OTHER): Payer: Medicare Other | Admitting: Internal Medicine

## 2013-07-16 VITALS — BP 150/82 | HR 80 | Temp 98.6°F | Resp 16 | Ht 68.0 in | Wt 217.0 lb

## 2013-07-16 DIAGNOSIS — Z0000 Encounter for general adult medical examination without abnormal findings: Secondary | ICD-10-CM

## 2013-07-16 DIAGNOSIS — E876 Hypokalemia: Secondary | ICD-10-CM

## 2013-07-16 DIAGNOSIS — I10 Essential (primary) hypertension: Secondary | ICD-10-CM

## 2013-07-16 MED ORDER — POTASSIUM CHLORIDE ER 8 MEQ PO TBCR: EXTENDED_RELEASE_TABLET | ORAL | Status: DC

## 2013-07-16 MED ORDER — TAMSULOSIN HCL 0.4 MG PO CAPS: 0.4000 mg | ORAL_CAPSULE | Freq: Every day | ORAL | Status: DC

## 2013-07-16 MED ORDER — AMLODIPINE BESY-BENAZEPRIL HCL 10-20 MG PO CAPS: 1.0000 | ORAL_CAPSULE | Freq: Every day | ORAL | Status: DC

## 2013-07-16 NOTE — Assessment & Plan Note (Signed)

## 2013-07-16 NOTE — Progress Notes (Signed)
Subjective:     HPI  The patient is here for a wellness exam. The patient has been doing well overall without major physical or psychological issues going on lately.   F/u parasomnia - better on klonopin  F/u BPH sx's he saw a urologist  The patient presents for a follow-up of  chronic hypertension, elev glucose, hypokalemia controlled with medicines and diet.  [facility] the pt for his R eye blinking x 12 mo; his R eyelid is drooping... - better after Botox inj  Review of Systems  Constitutional: Negative for appetite change, fatigue and unexpected weight change.  HENT: Negative for congestion, nosebleeds, sneezing, sore throat and trouble swallowing.   Eyes: Negative for itching and visual disturbance.  Respiratory: Negative for cough.   Cardiovascular: Negative for chest pain, palpitations and leg swelling.  Gastrointestinal: Negative for diarrhea, blood in stool and abdominal distention.  Genitourinary: Negative for frequency and hematuria.  Musculoskeletal: Negative for back pain, gait problem, joint swelling and neck pain.  Skin: Negative for rash.  Neurological: Negative for dizziness, tremors, speech difficulty and weakness.  Psychiatric/Behavioral: Negative for sleep disturbance, dysphoric mood and agitation. The patient is not nervous/anxious.    Wt Readings from Last 3 Encounters:  07/16/13 217 lb (98.431 kg)  01/11/13 213 lb (96.616 kg)  09/09/12 218 lb (98.884 kg)   BP Readings from Last 3 Encounters:  07/16/13 150/82  01/11/13 138/90  09/09/12 140/80        Objective:   Physical Exam  Constitutional: He is oriented to person, place, and time. He appears well-developed.  HENT:  Mouth/Throat: Oropharynx is clear and moist.  Eyes: Conjunctivae are normal. Pupils are equal, round, and reactive to light. Right eye exhibits no discharge. Left eye exhibits no discharge.  B ptosis  Neck: Normal range of motion. No JVD present. No thyromegaly  present.  Cardiovascular: Normal rate, regular rhythm, normal heart sounds and intact distal pulses.  Exam reveals no gallop and no friction rub.   No murmur heard. Pulmonary/Chest: Effort normal and breath sounds normal. No respiratory distress. He has no wheezes. He has no rales. He exhibits no tenderness.  Abdominal: Soft. Bowel sounds are normal. He exhibits no distension and no mass. There is no tenderness. There is no rebound and no guarding.  Musculoskeletal: Normal range of motion. He exhibits no edema and no tenderness.  Lymphadenopathy:    He has no cervical adenopathy.  Neurological: He is alert and oriented to person, place, and time. He has normal reflexes. A cranial nerve deficit is present. He exhibits normal muscle tone. Coordination normal.  Mild R facial droop No R eyelid twitching  Skin: Skin is warm and dry. No rash noted.  Psychiatric: He has a normal mood and affect. His behavior is normal. Judgment and thought content normal.  Rectal - prostate 1+; Stool G(-)  Lab Results  Component Value Date   WBC 4.7 07/14/2013   HGB 15.3 07/14/2013   HCT 43.7 07/14/2013   PLT 195.0 07/14/2013   GLUCOSE 119* 07/14/2013   CHOL 194 09/05/2010   TRIG 174.0* 09/05/2010   HDL 31.10* 09/05/2010   LDLDIRECT 115.9 09/08/2009   LDLCALC 128* 09/05/2010   ALT 25 07/14/2013   AST 21 07/14/2013   NA 138 07/14/2013   K 3.3* 07/14/2013   CL 103 07/14/2013   CREATININE 1.0 07/14/2013   BUN 12 07/14/2013   CO2 28 07/14/2013   TSH 2.40 07/14/2013   PSA 0.26 07/14/2013  HGBA1C 5.9 06/10/2012          Assessment & Plan:

## 2013-07-16 NOTE — Progress Notes (Signed)
Pre visit review using our clinic review tool, if applicable. No additional management support is needed unless otherwise documented below in the visit note. 

## 2013-09-15 ENCOUNTER — Encounter: Payer: Self-pay | Admitting: Gastroenterology

## 2013-09-15 DIAGNOSIS — G5139 Clonic hemifacial spasm, unspecified: Secondary | ICD-10-CM | POA: Insufficient documentation

## 2014-01-10 ENCOUNTER — Encounter: Payer: Self-pay | Admitting: Internal Medicine

## 2014-01-10 ENCOUNTER — Ambulatory Visit (INDEPENDENT_AMBULATORY_CARE_PROVIDER_SITE_OTHER): Payer: Medicare Other | Admitting: Internal Medicine

## 2014-01-10 ENCOUNTER — Other Ambulatory Visit (INDEPENDENT_AMBULATORY_CARE_PROVIDER_SITE_OTHER): Payer: Medicare Other

## 2014-01-10 VITALS — BP 140/80 | HR 64 | Temp 98.5°F | Ht 68.0 in | Wt 212.1 lb

## 2014-01-10 DIAGNOSIS — R739 Hyperglycemia, unspecified: Secondary | ICD-10-CM

## 2014-01-10 DIAGNOSIS — I10 Essential (primary) hypertension: Secondary | ICD-10-CM

## 2014-01-10 DIAGNOSIS — E876 Hypokalemia: Secondary | ICD-10-CM

## 2014-01-10 DIAGNOSIS — R7309 Other abnormal glucose: Secondary | ICD-10-CM

## 2014-01-10 LAB — BASIC METABOLIC PANEL
BUN: 10 mg/dL (ref 6–23)
CALCIUM: 8.6 mg/dL (ref 8.4–10.5)
CO2: 28 meq/L (ref 19–32)
Chloride: 107 mEq/L (ref 96–112)
Creatinine, Ser: 1 mg/dL (ref 0.4–1.5)
GFR: 92.83 mL/min (ref >60.00)
GLUCOSE: 107 mg/dL — AB (ref 70–99)
Potassium: 3.7 mEq/L (ref 3.5–5.1)
Sodium: 139 mEq/L (ref 135–145)

## 2014-01-10 LAB — HEMOGLOBIN A1C: Hgb A1c MFr Bld: 5.9 % (ref 4.6–6.5)

## 2014-01-10 NOTE — Assessment & Plan Note (Signed)
Labs

## 2014-01-10 NOTE — Progress Notes (Signed)
Patient ID: [name], male   DOB: 01/12/1941, 73 y.o.   MRN: [id number]   Subjective:     HPI  The patient has been doing well overall without major physical or psychological issues going on lately.   F/u parasomnia - better on klonopin  F/u BPH sx's he saw a urologist  The patient presents for a follow-up of  chronic hypertension, elev glucose, hypokalemia controlled with medicines and diet.  Dr [facility] saw the pt for his R eye blinking x 12 mo; his R eyelid is drooping... - better after Botox inj  Review of Systems  Constitutional: Negative for appetite change, fatigue and unexpected weight change.  HENT: Negative for congestion, nosebleeds, sneezing, sore throat and trouble swallowing.   Eyes: Negative for itching and visual disturbance.  Respiratory: Negative for cough.   Cardiovascular: Negative for chest pain, palpitations and leg swelling.  Gastrointestinal: Negative for diarrhea, blood in stool and abdominal distention.  Genitourinary: Negative for frequency and hematuria.  Musculoskeletal: Negative for back pain, gait problem, joint swelling and neck pain.  Skin: Negative for rash.  Neurological: Negative for dizziness, tremors, speech difficulty and weakness.  Psychiatric/Behavioral: Negative for sleep disturbance, dysphoric mood and agitation. The patient is not nervous/anxious.    Wt Readings from Last 3 Encounters:  01/10/14 212 lb 1.9 oz (96.217 kg)  07/16/13 217 lb (98.431 kg)  01/11/13 213 lb (96.616 kg)   BP Readings from Last 3 Encounters:  01/10/14 140/80  07/16/13 150/82  01/11/13 138/90        Objective:   Physical Exam  Constitutional: He is oriented to person, place, and time. He appears well-developed.  HENT:  Mouth/Throat: Oropharynx is clear and moist.  Eyes: Conjunctivae are normal. Pupils are equal, round, and reactive to light. Right eye exhibits no discharge. Left eye exhibits no discharge.  B ptosis  Neck: Normal range of  motion. No JVD present. No thyromegaly present.  Cardiovascular: Normal rate, regular rhythm, normal heart sounds and intact distal pulses.  Exam reveals no gallop and no friction rub.   No murmur heard. Pulmonary/Chest: Effort normal and breath sounds normal. No respiratory distress. He has no wheezes. He has no rales. He exhibits no tenderness.  Abdominal: Soft. Bowel sounds are normal. He exhibits no distension and no mass. There is no tenderness. There is no rebound and no guarding.  Musculoskeletal: Normal range of motion. He exhibits no edema and no tenderness.  Lymphadenopathy:    He has no cervical adenopathy.  Neurological: He is alert and oriented to person, place, and time. He has normal reflexes. A cranial nerve deficit is present. He exhibits normal muscle tone. Coordination normal.  Mild R facial droop No R eyelid twitching  Skin: Skin is warm and dry. No rash noted.  Psychiatric: He has a normal mood and affect. His behavior is normal. Judgment and thought content normal.    Lab Results  Component Value Date   WBC 4.7 07/14/2013   HGB 15.3 07/14/2013   HCT 43.7 07/14/2013   PLT 195.0 07/14/2013   GLUCOSE 119* 07/14/2013   CHOL 194 09/05/2010   TRIG 174.0* 09/05/2010   HDL 31.10* 09/05/2010   LDLDIRECT 115.9 09/08/2009   LDLCALC 128* 09/05/2010   ALT 25 07/14/2013   AST 21 07/14/2013   NA 138 07/14/2013   K 3.3* 07/14/2013   CL 103 07/14/2013   CREATININE 1.0 07/14/2013   BUN 12 07/14/2013   CO2 28 07/14/2013   TSH 2.40 07/14/2013  PSA 0.26 07/14/2013   HGBA1C 5.9 06/10/2012          Assessment & Plan:

## 2014-01-10 NOTE — Progress Notes (Signed)
Pre visit review using our clinic review tool, if applicable. No additional management support is needed unless otherwise documented below in the visit note. 

## 2014-01-10 NOTE — Assessment & Plan Note (Signed)
Continue with current prescription therapy as reflected on the Med list.  

## 2014-04-04 ENCOUNTER — Encounter: Payer: Self-pay | Admitting: Gastroenterology

## 2014-04-19 ENCOUNTER — Inpatient Hospital Stay (HOSPITAL_COMMUNITY)
Admission: EM | Admit: 2014-04-19 | Discharge: 2014-04-24 | DRG: 506 | Disposition: A | Discharge status: Home / Self Care | Payer: Medicare Other | Attending: Orthopedic Surgery | Admitting: Orthopedic Surgery

## 2014-04-19 ENCOUNTER — Encounter (HOSPITAL_COMMUNITY): Payer: Self-pay | Admitting: Emergency Medicine

## 2014-04-19 ENCOUNTER — Emergency Department (HOSPITAL_COMMUNITY): Payer: Medicare Other

## 2014-04-19 DIAGNOSIS — M659 Synovitis and tenosynovitis, unspecified: Secondary | ICD-10-CM | POA: Diagnosis not present

## 2014-04-19 DIAGNOSIS — M6510 Other infective (teno)synovitis, unspecified site: Secondary | ICD-10-CM

## 2014-04-19 DIAGNOSIS — Z888 Allergy status to other drugs, medicaments and biological substances status: Secondary | ICD-10-CM

## 2014-04-19 DIAGNOSIS — W540XXA Bitten by dog, initial encounter: Secondary | ICD-10-CM | POA: Diagnosis present

## 2014-04-19 DIAGNOSIS — L02511 Cutaneous abscess of right hand: Secondary | ICD-10-CM | POA: Diagnosis present

## 2014-04-19 DIAGNOSIS — E785 Hyperlipidemia, unspecified: Secondary | ICD-10-CM | POA: Diagnosis present

## 2014-04-19 DIAGNOSIS — I10 Essential (primary) hypertension: Secondary | ICD-10-CM | POA: Diagnosis present

## 2014-04-19 DIAGNOSIS — Z87891 Personal history of nicotine dependence: Secondary | ICD-10-CM

## 2014-04-19 DIAGNOSIS — L089 Local infection of the skin and subcutaneous tissue, unspecified: Secondary | ICD-10-CM | POA: Diagnosis present

## 2014-04-19 DIAGNOSIS — Z880 Allergy status to penicillin: Secondary | ICD-10-CM

## 2014-04-19 DIAGNOSIS — S61451A Open bite of right hand, initial encounter: Secondary | ICD-10-CM

## 2014-04-19 DIAGNOSIS — Z7982 Long term (current) use of aspirin: Secondary | ICD-10-CM

## 2014-04-19 DIAGNOSIS — Z79899 Other long term (current) drug therapy: Secondary | ICD-10-CM

## 2014-04-19 DIAGNOSIS — G4700 Insomnia, unspecified: Secondary | ICD-10-CM | POA: Diagnosis present

## 2014-04-19 HISTORY — PX: FINGER SURGERY: SHX640

## 2014-04-19 MED ORDER — SODIUM CHLORIDE 0.9 % IV SOLN
3.0000 g | Freq: Once | INTRAVENOUS | Status: DC
  Administered 2014-04-20: 3 g via INTRAVENOUS
  Filled 2014-04-19 (×2): qty 3

## 2014-04-19 NOTE — ED Notes (Signed)
Pt presents with c/o dog bite to his right index finger. Pt reports the bite happened on Sunday and it was his personal dog who is up to date on his rabies shots. Pt has some swelling to his right hand at this time and is febrile in triage. Pt has the area bandaged at this time.

## 2014-04-19 NOTE — ED Provider Notes (Addendum)
CSN: [id number]     Arrival date & time 04/19/14  1924 History   First MD Initiated Contact with Patient 04/19/14 2243     Chief Complaint  Patient presents with  . Animal Bite     (Consider location/radiation/quality/duration/timing/severity/associated sxs/prior Treatment) HPI The patient sustained a dog bite to his right hand on Sunday which was 3 days ago. Since that time he has developed significant swelling of his right index finger and inability to bend it. He reports it is quite painful. He reports it is his pet dog and the dog is up-to-date on its vaccinations and rabies. The patient denies she's had any fevers or chills at home. He denies general illness or malaise. Past Medical History  Diagnosis Date  . Hypertension   . Hyperlipidemia   . Benign prostatic hypertrophy   . Insomnia   . UTI (lower urinary tract infection)   . Seasonal allergies   . Parasomnia 10/14/2011    PSG 12/15/11>>AHI 0.4, SpO2 low 89%, PLMI 16, somnoliloquay, nocturia    History reviewed. No pertinent past surgical history. Family History  Problem Relation Age of Onset  . Diabetes Other   . Diabetes Mother    History  Substance Use Topics  . Smoking status: Former Smoker -- 1.00 packs/day for 7 years    Types: Cigarettes, Cigars  . Smokeless tobacco: Not on file     Comment: cigars day/ 1-2  . Alcohol Use: Yes     Comment: OCCASSIONAL WINE -1 glass/month    Review of Systems 10 Systems reviewed and are negative for acute change except as noted in the HPI.    Allergies  Atorvastatin; Penicillins; and Sulfonamide derivatives  The patient's allergies are reviewed with him. He does not have a recollection of a specific reaction to any kind of penicillins. He thinks he might of had some over 10-15 years ago and thinks perhaps he had some vague spots on his hands. He does not endorse of her having experienced hives in association with that he does not endorse ever having had tightening of his  throat difficulty breathing or GI symptoms. He describes a sulfa allergy as having become sweaty and lightheaded once while he was taking sulfa.  Home Medications   Prior to Admission medications   Medication Sig Start Date End Date Taking? Authorizing Provider  amLODipine-benazepril (LOTREL) 10-20 MG per capsule Take 1 capsule by mouth daily. 07/16/13 07/16/14 Yes Aleksei Plotnikov V, MD  aspirin 81 MG tablet Take 81 mg by mouth daily.     Yes Historical Provider, MD  Cholecalciferol (VITAMIN D) 1000 UNITS capsule Take 1 capsule (1,000 Units total) by mouth daily. 09/09/12  Yes Aleksei Plotnikov V, MD  clonazePAM (KLONOPIN) 0.5 MG tablet Take 0.5 mg by mouth at bedtime as needed for anxiety (sleep).   Yes Historical Provider, MD  naproxen sodium (ANAPROX) 220 MG tablet Take 220 mg by mouth 2 (two) times daily as needed (pain).   Yes Historical Provider, MD  potassium chloride (KLOR-CON) 8 MEQ tablet Take 1 tablet (8 MEQ total) by mouth daily. 07/16/13  Yes Aleksei Plotnikov V, MD   BP 159/82  Pulse 96  Temp(Src) 99.5 F (37.5 C) (Oral)  Resp 20  Ht 5\' 8"  (1.727 m)  Wt 215 lb (97.523 kg)  BMI 32.70 kg/m2  SpO2 98% Physical Exam  Constitutional: He is oriented to person, place, and time. He appears well-developed and well-nourished.  HENT:  Head: Normocephalic.  Left Ear: External ear normal.  Eyes: EOM are normal.  Cardiovascular: Intact distal pulses.   Pulmonary/Chest: Effort normal.  Musculoskeletal:  Patient's right index finger is swollen in its entirety. He has 2 punctures on either side of the index finger at the second phalangeal segment. Pus can be milked from the wound on the medial segment. Patient is unable to flex at the interphalangeal joints of the index finger., He does have some flexion at the metacarpal. The fourth finger has also puncture wounds with some purulent drainage at the distal portion over the DIP. There is erythema and swelling however this is not extending  over the medial segment nor involving the proximal interphalangeal joint.  Neurological: He is alert and oriented to person, place, and time. Coordination normal.  Skin: Skin is warm and dry.  Psychiatric: He has a normal mood and affect.    ED Course  Procedures (including critical care time) Labs Review Labs Reviewed  BASIC METABOLIC PANEL - Abnormal; Notable for the following:    Potassium 3.5 (*)    Glucose, Bld 125 (*)    GFR calc non Af Amer 71 (*)    GFR calc Af Amer 82 (*)    All other components within normal limits  WOUND CULTURE  CULTURE, BLOOD (ROUTINE X 2)  CULTURE, BLOOD (ROUTINE X 2)  CBC    Imaging Review Dg Hand Complete Right  04/20/2014   CLINICAL DATA:  Recent right index finger puncture wound on radial side status post dog bite. Now hand is swollen, inflamed, red, and painful.  EXAM: RIGHT HAND - COMPLETE 3+ VIEW  COMPARISON:  None.  FINDINGS: Soft tissue swelling of the hand, most pronounced along the second digit. No radiopaque foreign body. Tiny amount of radial soft tissue gas at the level of the second PIP is questioned. Mild diffuse osteopenia. First interphalangeal, second PIP and second through fourth DIP mild degenerative changes.  IMPRESSION: Soft tissue swelling of the hand and second digit. There may be a tiny amount of soft tissue gas at the reported puncture site. No radiopaque foreign body.  Osteopenia and mild degenerative changes as above. No acute osseous finding of the right hand.   Electronically Signed   By: Jearld LeschAndrew  DelGaizo M.D.   On: 04/20/2014 00:15     EKG Interpretation None     The patient's case was consult with Dr. Amanda PeaGramig. He will be [facility] at this time [facility] for [facility] intervention.  Reviewed with Dr. Fonnie JarvisBednar for transfer to [facility] in preparation of surgical intervention. MDM   Final diagnoses:  Dog bite  Tenosynovitis, purulent   Patient has a clearly infected bite wound to the hand. He does not have any  flexion of the index finger findings are consistent with a purulent tenosynovitis. Patient has been counseled on the importance of treatment. He is aware of the risks of nontreatment including permanent loss of function of the hand and possibility of systemic infection and serious illness or death. The patient is being transferred [facility] by private vehicle.    Arby BarretteMarcy Moritz Lever, MD 04/20/14 [phone]  Arby BarretteMarcy Stephen Baruch, MD 04/20/14 [phone]

## 2014-04-20 ENCOUNTER — Encounter (HOSPITAL_COMMUNITY): Payer: Self-pay | Admitting: General Practice

## 2014-04-20 ENCOUNTER — Encounter (HOSPITAL_COMMUNITY): Payer: Medicare Other | Admitting: Certified Registered Nurse Anesthetist

## 2014-04-20 ENCOUNTER — Emergency Department (HOSPITAL_COMMUNITY): Payer: Medicare Other | Admitting: Certified Registered Nurse Anesthetist

## 2014-04-20 ENCOUNTER — Encounter (HOSPITAL_COMMUNITY)
Admission: EM | Disposition: A | Discharge status: Home / Self Care | Payer: Self-pay | Source: Home / Self Care | Attending: Orthopedic Surgery

## 2014-04-20 DIAGNOSIS — L02511 Cutaneous abscess of right hand: Secondary | ICD-10-CM | POA: Diagnosis present

## 2014-04-20 DIAGNOSIS — Z79899 Other long term (current) drug therapy: Secondary | ICD-10-CM | POA: Diagnosis not present

## 2014-04-20 DIAGNOSIS — G4700 Insomnia, unspecified: Secondary | ICD-10-CM | POA: Diagnosis present

## 2014-04-20 DIAGNOSIS — L089 Local infection of the skin and subcutaneous tissue, unspecified: Secondary | ICD-10-CM | POA: Diagnosis present

## 2014-04-20 DIAGNOSIS — M659 Synovitis and tenosynovitis, unspecified: Secondary | ICD-10-CM | POA: Diagnosis present

## 2014-04-20 DIAGNOSIS — W540XXA Bitten by dog, initial encounter: Secondary | ICD-10-CM | POA: Diagnosis present

## 2014-04-20 DIAGNOSIS — Z7982 Long term (current) use of aspirin: Secondary | ICD-10-CM | POA: Diagnosis not present

## 2014-04-20 DIAGNOSIS — Z888 Allergy status to other drugs, medicaments and biological substances status: Secondary | ICD-10-CM | POA: Diagnosis not present

## 2014-04-20 DIAGNOSIS — E785 Hyperlipidemia, unspecified: Secondary | ICD-10-CM | POA: Diagnosis present

## 2014-04-20 DIAGNOSIS — Z87891 Personal history of nicotine dependence: Secondary | ICD-10-CM | POA: Diagnosis not present

## 2014-04-20 DIAGNOSIS — S61451A Open bite of right hand, initial encounter: Secondary | ICD-10-CM

## 2014-04-20 DIAGNOSIS — I10 Essential (primary) hypertension: Secondary | ICD-10-CM | POA: Diagnosis present

## 2014-04-20 DIAGNOSIS — Z880 Allergy status to penicillin: Secondary | ICD-10-CM | POA: Diagnosis not present

## 2014-04-20 HISTORY — PX: I&D EXTREMITY: SHX5045

## 2014-04-20 LAB — BASIC METABOLIC PANEL
Anion gap: 11 (ref 5–15)
Anion gap: 14 (ref 5–15)
BUN: 13 mg/dL (ref 6–23)
BUN: 14 mg/dL (ref 6–23)
CHLORIDE: 97 meq/L (ref 96–112)
CO2: 25 mEq/L (ref 19–32)
CO2: 27 meq/L (ref 19–32)
Calcium: 8.8 mg/dL (ref 8.4–10.5)
Calcium: 8.9 mg/dL (ref 8.4–10.5)
Chloride: 98 mEq/L (ref 96–112)
Creatinine, Ser: 0.99 mg/dL (ref 0.50–1.35)
Creatinine, Ser: 1.01 mg/dL (ref 0.50–1.35)
GFR calc Af Amer: 82 mL/min — ABNORMAL LOW (ref >90)
GFR calc Af Amer: >90 mL/min (ref >90)
GFR calc non Af Amer: 79 mL/min — ABNORMAL LOW (ref >90)
GFR, EST NON AFRICAN AMERICAN: 71 mL/min — AB (ref >90)
GLUCOSE: 129 mg/dL — AB (ref 70–99)
Glucose, Bld: 125 mg/dL — ABNORMAL HIGH (ref 70–99)
POTASSIUM: 3.9 meq/L (ref 3.7–5.3)
POTASSIUM: 3.5 meq/L — AB (ref 3.7–5.3)
SODIUM: 135 meq/L — AB (ref 137–147)
Sodium: 137 mEq/L (ref 137–147)

## 2014-04-20 LAB — CBC
HCT: 41.3 % (ref 39.0–52.0)
Hemoglobin: 14.8 g/dL (ref 13.0–17.0)
MCH: 30.6 pg (ref 26.0–34.0)
MCHC: 35.8 g/dL (ref 30.0–36.0)
MCV: 85.5 fL (ref 78.0–100.0)
Platelets: 171 10*3/uL (ref 150–400)
RBC: 4.83 MIL/uL (ref 4.22–5.81)
RDW: 12.1 % (ref 11.5–15.5)
WBC: 8.7 10*3/uL (ref 4.0–10.5)

## 2014-04-20 LAB — CBC WITH DIFFERENTIAL/PLATELET
Basophils Absolute: 0 10*3/uL (ref 0.0–0.1)
Basophils Relative: 0 % (ref 0–1)
EOS PCT: 0 % (ref 0–5)
Eosinophils Absolute: 0 10*3/uL (ref 0.0–0.7)
HEMATOCRIT: 38.2 % — AB (ref 39.0–52.0)
Hemoglobin: 13.8 g/dL (ref 13.0–17.0)
LYMPHS ABS: 1.2 10*3/uL (ref 0.7–4.0)
LYMPHS PCT: 15 % (ref 12–46)
MCH: 30.5 pg (ref 26.0–34.0)
MCHC: 36.1 g/dL — ABNORMAL HIGH (ref 30.0–36.0)
MCV: 84.5 fL (ref 78.0–100.0)
Monocytes Absolute: 1 10*3/uL (ref 0.1–1.0)
Monocytes Relative: 12 % (ref 3–12)
Neutro Abs: 5.8 10*3/uL (ref 1.7–7.7)
Neutrophils Relative %: 73 % (ref 43–77)
Platelets: 170 10*3/uL (ref 150–400)
RBC: 4.52 MIL/uL (ref 4.22–5.81)
RDW: 12.2 % (ref 11.5–15.5)
WBC: 7.9 10*3/uL (ref 4.0–10.5)

## 2014-04-20 SURGERY — IRRIGATION AND DEBRIDEMENT EXTREMITY
Anesthesia: General | Site: Finger | Laterality: Right

## 2014-04-20 MED ORDER — OXYCODONE HCL 5 MG PO TABS
5.0000 mg | ORAL_TABLET | Freq: Once | ORAL | Status: AC | PRN
  Administered 2014-04-20: 5 mg via ORAL

## 2014-04-20 MED ORDER — METHOCARBAMOL 500 MG PO TABS
500.0000 mg | ORAL_TABLET | Freq: Four times a day (QID) | ORAL | Status: DC | PRN
  Administered 2014-04-20: 500 mg via ORAL
  Filled 2014-04-20: qty 1

## 2014-04-20 MED ORDER — OXYCODONE HCL 5 MG/5ML PO SOLN: 5.0000 mg | Freq: Once | ORAL | Status: AC | PRN

## 2014-04-20 MED ORDER — ONDANSETRON HCL 4 MG/2ML IJ SOLN: 4.0000 mg | Freq: Once | INTRAMUSCULAR | Status: DC | PRN

## 2014-04-20 MED ORDER — ARTIFICIAL TEARS OP OINT
TOPICAL_OINTMENT | OPHTHALMIC | Status: AC
  Filled 2014-04-20: qty 3.5

## 2014-04-20 MED ORDER — CLONAZEPAM 0.5 MG PO TABS
0.5000 mg | ORAL_TABLET | Freq: Every evening | ORAL | Status: DC | PRN
  Filled 2014-04-20: qty 1

## 2014-04-20 MED ORDER — HYDROMORPHONE HCL 1 MG/ML IJ SOLN
INTRAMUSCULAR | Status: AC
  Filled 2014-04-20: qty 1

## 2014-04-20 MED ORDER — CLINDAMYCIN PHOSPHATE 600 MG/50ML IV SOLN
600.0000 mg | Freq: Three times a day (TID) | INTRAVENOUS | Status: DC
  Administered 2014-04-20 – 2014-04-24 (×13): 600 mg via INTRAVENOUS
  Filled 2014-04-20 (×17): qty 50

## 2014-04-20 MED ORDER — AMLODIPINE BESYLATE 10 MG PO TABS
10.0000 mg | ORAL_TABLET | Freq: Every day | ORAL | Status: DC
  Administered 2014-04-20 – 2014-04-24 (×4): 10 mg via ORAL
  Filled 2014-04-20 (×5): qty 1

## 2014-04-20 MED ORDER — PROPOFOL 10 MG/ML IV BOLUS
INTRAVENOUS | Status: DC | PRN
  Administered 2014-04-20: 120 mg via INTRAVENOUS

## 2014-04-20 MED ORDER — ONDANSETRON HCL 4 MG/2ML IJ SOLN
INTRAMUSCULAR | Status: AC
  Filled 2014-04-20: qty 2

## 2014-04-20 MED ORDER — PROMETHAZINE HCL 25 MG RE SUPP: 12.5000 mg | Freq: Four times a day (QID) | RECTAL | Status: DC | PRN

## 2014-04-20 MED ORDER — ONDANSETRON HCL 4 MG/2ML IJ SOLN
INTRAMUSCULAR | Status: DC | PRN
  Administered 2014-04-20: 4 mg via INTRAVENOUS

## 2014-04-20 MED ORDER — ONDANSETRON HCL 4 MG PO TABS: 4.0000 mg | ORAL_TABLET | Freq: Four times a day (QID) | ORAL | Status: DC | PRN

## 2014-04-20 MED ORDER — EPHEDRINE SULFATE 50 MG/ML IJ SOLN
INTRAMUSCULAR | Status: DC | PRN
  Administered 2014-04-20: 5 mg via INTRAVENOUS

## 2014-04-20 MED ORDER — SODIUM CHLORIDE 0.45 % IV SOLN
INTRAVENOUS | Status: DC
  Filled 2014-04-20: qty 250

## 2014-04-20 MED ORDER — LIDOCAINE HCL (PF) 1 % IJ SOLN
INTRAMUSCULAR | Status: DC
  Administered 2014-04-20: 04:00:00
  Filled 2014-04-20 (×5): qty 250

## 2014-04-20 MED ORDER — FAMOTIDINE 20 MG PO TABS: 20.0000 mg | ORAL_TABLET | Freq: Two times a day (BID) | ORAL | Status: DC | PRN

## 2014-04-20 MED ORDER — LIDOCAINE HCL (CARDIAC) 20 MG/ML IV SOLN
INTRAVENOUS | Status: DC | PRN
  Administered 2014-04-20: 100 mg via INTRAVENOUS

## 2014-04-20 MED ORDER — LIDOCAINE HCL (CARDIAC) 20 MG/ML IV SOLN
INTRAVENOUS | Status: AC
  Filled 2014-04-20: qty 5

## 2014-04-20 MED ORDER — ALBUTEROL SULFATE HFA 108 (90 BASE) MCG/ACT IN AERS
INHALATION_SPRAY | RESPIRATORY_TRACT | Status: DC | PRN
  Administered 2014-04-20: 2 via RESPIRATORY_TRACT

## 2014-04-20 MED ORDER — MORPHINE SULFATE 2 MG/ML IJ SOLN: 1.0000 mg | INTRAMUSCULAR | Status: DC | PRN

## 2014-04-20 MED ORDER — OXYCODONE HCL 5 MG PO TABS
ORAL_TABLET | ORAL | Status: AC
  Filled 2014-04-20: qty 1

## 2014-04-20 MED ORDER — ONDANSETRON HCL 4 MG/2ML IJ SOLN: 4.0000 mg | Freq: Four times a day (QID) | INTRAMUSCULAR | Status: DC | PRN

## 2014-04-20 MED ORDER — CIPROFLOXACIN IN D5W 400 MG/200ML IV SOLN
INTRAVENOUS | Status: DC | PRN
  Administered 2014-04-20: 400 mg via INTRAVENOUS

## 2014-04-20 MED ORDER — FENTANYL CITRATE 0.05 MG/ML IJ SOLN
INTRAMUSCULAR | Status: DC | PRN
  Administered 2014-04-20 (×4): 50 ug via INTRAVENOUS

## 2014-04-20 MED ORDER — OXYCODONE HCL 5 MG PO TABS
5.0000 mg | ORAL_TABLET | ORAL | Status: DC | PRN
  Filled 2014-04-20: qty 2

## 2014-04-20 MED ORDER — DEXTROSE 5 % IV SOLN: 500.0000 mg | Freq: Four times a day (QID) | INTRAVENOUS | Status: DC | PRN

## 2014-04-20 MED ORDER — LACTATED RINGERS IV SOLN
INTRAVENOUS | Status: DC | PRN
  Administered 2014-04-20: 02:00:00 via INTRAVENOUS

## 2014-04-20 MED ORDER — PROPOFOL 10 MG/ML IV BOLUS
INTRAVENOUS | Status: AC
  Filled 2014-04-20: qty 20

## 2014-04-20 MED ORDER — SODIUM CHLORIDE 0.9 % IR SOLN
Status: DC | PRN
  Administered 2014-04-20: 1000 mL

## 2014-04-20 MED ORDER — METHOCARBAMOL 500 MG PO TABS
ORAL_TABLET | ORAL | Status: AC
  Filled 2014-04-20: qty 1

## 2014-04-20 MED ORDER — HYDROMORPHONE HCL 1 MG/ML IJ SOLN
0.2500 mg | INTRAMUSCULAR | Status: DC | PRN
  Administered 2014-04-20 (×2): 0.5 mg via INTRAVENOUS

## 2014-04-20 MED ORDER — MEPERIDINE HCL 25 MG/ML IJ SOLN: 6.2500 mg | INTRAMUSCULAR | Status: DC | PRN

## 2014-04-20 MED ORDER — CIPROFLOXACIN IN D5W 400 MG/200ML IV SOLN
400.0000 mg | INTRAVENOUS | Status: DC
  Filled 2014-04-20: qty 200

## 2014-04-20 MED ORDER — VITAMIN C 500 MG PO TABS
1000.0000 mg | ORAL_TABLET | Freq: Every day | ORAL | Status: DC
  Administered 2014-04-20 – 2014-04-24 (×4): 1000 mg via ORAL
  Filled 2014-04-20 (×5): qty 2

## 2014-04-20 MED ORDER — BENAZEPRIL HCL 20 MG PO TABS
20.0000 mg | ORAL_TABLET | Freq: Every day | ORAL | Status: DC
  Administered 2014-04-20 – 2014-04-24 (×4): 20 mg via ORAL
  Filled 2014-04-20 (×5): qty 1

## 2014-04-20 MED ORDER — SUCCINYLCHOLINE CHLORIDE 20 MG/ML IJ SOLN
INTRAMUSCULAR | Status: DC | PRN
  Administered 2014-04-20: 120 mg via INTRAVENOUS

## 2014-04-20 MED ORDER — CIPROFLOXACIN IN D5W 400 MG/200ML IV SOLN
400.0000 mg | Freq: Two times a day (BID) | INTRAVENOUS | Status: DC
  Administered 2014-04-20 – 2014-04-23 (×6): 400 mg via INTRAVENOUS
  Filled 2014-04-20 (×7): qty 200

## 2014-04-20 MED ORDER — AMLODIPINE BESY-BENAZEPRIL HCL 10-20 MG PO CAPS: 1.0000 | ORAL_CAPSULE | Freq: Every day | ORAL | Status: DC

## 2014-04-20 MED ORDER — NEOSTIGMINE METHYLSULFATE 10 MG/10ML IV SOLN
INTRAVENOUS | Status: AC
  Filled 2014-04-20: qty 1

## 2014-04-20 MED ORDER — DOCUSATE SODIUM 100 MG PO CAPS
100.0000 mg | ORAL_CAPSULE | Freq: Two times a day (BID) | ORAL | Status: DC
  Administered 2014-04-20 – 2014-04-23 (×7): 100 mg via ORAL
  Filled 2014-04-20 (×8): qty 1

## 2014-04-20 MED ORDER — POTASSIUM CHLORIDE ER 8 MEQ PO TBCR
8.0000 meq | EXTENDED_RELEASE_TABLET | Freq: Once | ORAL | Status: AC
  Administered 2014-04-20: 8 meq via ORAL
  Filled 2014-04-20: qty 1

## 2014-04-20 MED ORDER — SUCCINYLCHOLINE CHLORIDE 20 MG/ML IJ SOLN
INTRAMUSCULAR | Status: AC
  Filled 2014-04-20: qty 1

## 2014-04-20 MED ORDER — FENTANYL CITRATE 0.05 MG/ML IJ SOLN
INTRAMUSCULAR | Status: AC
  Filled 2014-04-20: qty 5

## 2014-04-20 SURGICAL SUPPLY — 56 items
BANDAGE ELASTIC 3 VELCRO ST LF (GAUZE/BANDAGES/DRESSINGS) ×3 IMPLANT
BANDAGE ELASTIC 4 VELCRO ST LF (GAUZE/BANDAGES/DRESSINGS) ×3 IMPLANT
BNDG CONFORM 2 STRL LF (GAUZE/BANDAGES/DRESSINGS) IMPLANT
BNDG CONFORM 3 STRL LF (GAUZE/BANDAGES/DRESSINGS) ×3 IMPLANT
BNDG GAUZE ELAST 4 BULKY (GAUZE/BANDAGES/DRESSINGS) ×3 IMPLANT
CORDS BIPOLAR (ELECTRODE) ×3 IMPLANT
CUFF TOURNIQUET SINGLE 18IN (TOURNIQUET CUFF) ×3 IMPLANT
CUFF TOURNIQUET SINGLE 24IN (TOURNIQUET CUFF) IMPLANT
DRSG ADAPTIC 3X8 NADH LF (GAUZE/BANDAGES/DRESSINGS) IMPLANT
DRSG EMULSION OIL 3X3 NADH (GAUZE/BANDAGES/DRESSINGS) ×3 IMPLANT
ELECT REM PT RETURN 9FT ADLT (ELECTROSURGICAL)
ELECTRODE REM PT RTRN 9FT ADLT (ELECTROSURGICAL) IMPLANT
GAUZE SPONGE 4X4 12PLY STRL (GAUZE/BANDAGES/DRESSINGS) ×3 IMPLANT
GAUZE XEROFORM 1X8 LF (GAUZE/BANDAGES/DRESSINGS) ×3 IMPLANT
GLOVE BIO SURGEON STRL SZ7 (GLOVE) ×3 IMPLANT
GLOVE BIOGEL M STRL SZ7.5 (GLOVE) ×3 IMPLANT
GLOVE BIOGEL PI IND STRL 7.0 (GLOVE) ×1 IMPLANT
GLOVE BIOGEL PI INDICATOR 7.0 (GLOVE) ×2
GLOVE SS BIOGEL STRL SZ 8 (GLOVE) ×1 IMPLANT
GLOVE SUPERSENSE BIOGEL SZ 8 (GLOVE) ×2
GOWN STRL REUS W/ TWL LRG LVL3 (GOWN DISPOSABLE) IMPLANT
GOWN STRL REUS W/ TWL XL LVL3 (GOWN DISPOSABLE) ×2 IMPLANT
GOWN STRL REUS W/TWL LRG LVL3 (GOWN DISPOSABLE)
GOWN STRL REUS W/TWL XL LVL3 (GOWN DISPOSABLE) ×4
HANDPIECE INTERPULSE COAX TIP (DISPOSABLE)
KIT BASIN OR (CUSTOM PROCEDURE TRAY) ×3 IMPLANT
KIT ROOM TURNOVER OR (KITS) ×3 IMPLANT
LOOP VESSEL MAXI BLUE (MISCELLANEOUS) ×3 IMPLANT
MANIFOLD NEPTUNE II (INSTRUMENTS) ×3 IMPLANT
NEEDLE HYPO 25GX1X1/2 BEV (NEEDLE) IMPLANT
NS IRRIG 1000ML POUR BTL (IV SOLUTION) ×3 IMPLANT
PACK ORTHO EXTREMITY (CUSTOM PROCEDURE TRAY) ×3 IMPLANT
PAD ARMBOARD 7.5X6 YLW CONV (MISCELLANEOUS) ×6 IMPLANT
PAD CAST 4YDX4 CTTN HI CHSV (CAST SUPPLIES) ×1 IMPLANT
PADDING CAST ABS 3INX4YD NS (CAST SUPPLIES) ×2
PADDING CAST ABS COTTON 3X4 (CAST SUPPLIES) ×1 IMPLANT
PADDING CAST COTTON 4X4 STRL (CAST SUPPLIES) ×2
SET HNDPC FAN SPRY TIP SCT (DISPOSABLE) IMPLANT
SPLINT PLASTER CAST XFAST 3X15 (CAST SUPPLIES) ×1 IMPLANT
SPLINT PLASTER XTRA FASTSET 3X (CAST SUPPLIES) ×2
SPONGE GAUZE 4X4 12PLY STER LF (GAUZE/BANDAGES/DRESSINGS) ×3 IMPLANT
SPONGE LAP 18X18 X RAY DECT (DISPOSABLE) IMPLANT
SPONGE LAP 4X18 X RAY DECT (DISPOSABLE) IMPLANT
SUT PROLENE 4 0 PS 2 18 (SUTURE) ×3 IMPLANT
SWAB CULTURE LIQUID MINI MALE (MISCELLANEOUS) ×6 IMPLANT
SYR CONTROL 10ML LL (SYRINGE) IMPLANT
SYRINGE 20CC LL (MISCELLANEOUS) ×3 IMPLANT
TOWEL OR 17X24 6PK STRL BLUE (TOWEL DISPOSABLE) ×3 IMPLANT
TOWEL OR 17X26 10 PK STRL BLUE (TOWEL DISPOSABLE) ×3 IMPLANT
TUBE ANAEROBIC SPECIMEN COL (MISCELLANEOUS) ×3 IMPLANT
TUBE CONNECTING 12'X1/4 (SUCTIONS) ×1
TUBE CONNECTING 12X1/4 (SUCTIONS) ×2 IMPLANT
TUBE FEEDING 5FR 15 INCH (TUBING) ×3 IMPLANT
TUBING CYSTO DISP (UROLOGICAL SUPPLIES) ×3 IMPLANT
WATER STERILE IRR 1000ML POUR (IV SOLUTION) IMPLANT
YANKAUER SUCT BULB TIP NO VENT (SUCTIONS) ×3 IMPLANT

## 2014-04-20 NOTE — ED Provider Notes (Signed)
CSN: [id number]     Arrival date & time 04/19/14  1924 History   First MD Initiated Contact with Patient 04/19/14 2243     Chief Complaint  Patient presents with  . Animal Bite   [name] is a 73 y.o. male who presents to the emergency department with a dog bite to his right hand 3 days ago. He reports this is his personal dog and was a cocker spaniel that is up-to-date on its vaccinations. He presented having increasing pain, swelling as well as pus drainage from the site. He reports having difficulty moving his right index finger. He denies fevers, chills, numbness, tingling, nausea, vomiting, abdominal pain, headache, dizziness, rashes.  (Consider location/radiation/quality/duration/timing/severity/associated sxs/prior Treatment) Patient is a 73 y.o. male presenting with animal bite. The history is provided by the patient and the spouse.  Animal Bite Contact animal:  Dog Location:  Hand Hand injury location:  R hand Time since incident:  3 days Pain details:    Quality:  Dull   Severity:  Mild   Timing:  Constant   Progression:  Worsening Incident location:  Home Animal's rabies vaccination status:  Up to date Animal in possession: yes   Tetanus status:  Unknown Relieved by:  Nothing Worsened by:  Activity Ineffective treatments:  None tried Associated symptoms: swelling   Associated symptoms: no fever, no numbness and no rash     Past Medical History  Diagnosis Date  . Hypertension   . Hyperlipidemia   . Benign prostatic hypertrophy   . Insomnia   . UTI (lower urinary tract infection)   . Seasonal allergies   . Parasomnia 10/14/2011    PSG 12/15/11>>AHI 0.4, SpO2 low 89%, PLMI 16, somnoliloquay, nocturia    History reviewed. No pertinent past surgical history. Family History  Problem Relation Age of Onset  . Diabetes Other   . Diabetes Mother    History  Substance Use Topics  . Smoking status: Former Smoker -- 1.00 packs/day for 7 years    Types:  Cigarettes, Cigars  . Smokeless tobacco: Not on file     Comment: cigars day/ 1-2  . Alcohol Use: Yes     Comment: OCCASSIONAL WINE -1 glass/month    Review of Systems  Constitutional: Negative for fever, chills and fatigue.  Respiratory: Negative for shortness of breath.   Cardiovascular: Negative for chest pain and palpitations.  Gastrointestinal: Negative for nausea, vomiting and abdominal pain.  Skin: Positive for color change and wound. Negative for rash.  Neurological: Negative for weakness, numbness and headaches.  All other systems reviewed and are negative.     Allergies  Atorvastatin; Penicillins; and Sulfonamide derivatives  Home Medications   Prior to Admission medications   Medication Sig Start Date End Date Taking? Authorizing Provider  amLODipine-benazepril (LOTREL) 10-20 MG per capsule Take 1 capsule by mouth daily. 07/16/13 07/16/14 Yes Aleksei Plotnikov V, MD  aspirin 81 MG tablet Take 81 mg by mouth daily.     Yes Historical Provider, MD  Cholecalciferol (VITAMIN D) 1000 UNITS capsule Take 1 capsule (1,000 Units total) by mouth daily. 09/09/12  Yes Aleksei Plotnikov V, MD  clonazePAM (KLONOPIN) 0.5 MG tablet Take 0.5 mg by mouth at bedtime as needed for anxiety (sleep).   Yes Historical Provider, MD  naproxen sodium (ANAPROX) 220 MG tablet Take 220 mg by mouth 2 (two) times daily as needed (pain).   Yes Historical Provider, MD  potassium chloride (KLOR-CON) 8 MEQ tablet Take 1 tablet (8 MEQ total)  by mouth daily. 07/16/13  Yes Aleksei Plotnikov V, MD   BP 159/82  Pulse 96  Temp(Src) 99.5 F (37.5 C) (Oral)  Resp 20  Ht 5\' 8"  (1.727 m)  Wt 215 lb (97.523 kg)  BMI 32.70 kg/m2  SpO2 98% Physical Exam  Nursing note and vitals reviewed. Constitutional: He appears well-developed and well-nourished. No distress.  HENT:  Head: Normocephalic and atraumatic.  Mouth/Throat: Oropharynx is clear and moist.  Eyes: Conjunctivae are normal. Pupils are equal, round, and  reactive to light. Right eye exhibits no discharge. Left eye exhibits no discharge.  Neck: Neck supple.  Cardiovascular: Normal rate, regular rhythm, normal heart sounds and intact distal pulses.  Exam reveals no gallop and no friction rub.   No murmur heard. Pulmonary/Chest: Effort normal and breath sounds normal. No respiratory distress. He has no wheezes. He has no rales.  Abdominal: Soft. There is no tenderness.  Musculoskeletal:  Dog bite to right hand with puncture wounds on digits #2, #3, #4. Pus draining from puncture wound on index finger. Edema and erythema noted to the dorsal and palmar aspect of his hand. Decreased range of motion noted in the index MCP joint.  Lymphadenopathy:    He has no cervical adenopathy.  Neurological: He is alert. Coordination normal.  Skin: Skin is warm and dry. No rash noted. He is not diaphoretic. No pallor.  Dog bite to right hand with puncture wounds on digits #2, #3, #4. Pus draining from puncture wound on index finger. Edema and erythema noted to the dorsal and palmar aspect of his hand.   Psychiatric: He has a normal mood and affect. His behavior is normal.    ED Course  Procedures (including critical care time) Labs Review Labs Reviewed  BASIC METABOLIC PANEL - Abnormal; Notable for the following:    Potassium 3.5 (*)    Glucose, Bld 125 (*)    GFR calc non Af Amer 71 (*)    GFR calc Af Amer 82 (*)    All other components within normal limits  WOUND CULTURE  CULTURE, BLOOD (ROUTINE X 2)  CULTURE, BLOOD (ROUTINE X 2)  CBC    Imaging Review Dg Hand Complete Right  04/20/2014   CLINICAL DATA:  Recent right index finger puncture wound on radial side status post dog bite. Now hand is swollen, inflamed, red, and painful.  EXAM: RIGHT HAND - COMPLETE 3+ VIEW  COMPARISON:  None.  FINDINGS: Soft tissue swelling of the hand, most pronounced along the second digit. No radiopaque foreign body. Tiny amount of radial soft tissue gas at the level of  the second PIP is questioned. Mild diffuse osteopenia. First interphalangeal, second PIP and second through fourth DIP mild degenerative changes.  IMPRESSION: Soft tissue swelling of the hand and second digit. There may be a tiny amount of soft tissue gas at the reported puncture site. No radiopaque foreign body.  Osteopenia and mild degenerative changes as above. No acute osseous finding of the right hand.   Electronically Signed   By: Jearld LeschAndrew  DelGaizo M.D.   On: 04/20/2014 00:15     EKG Interpretation None      Filed Vitals:   04/19/14 1945 04/19/14 2228  BP: 163/90 159/82  Pulse: 96   Temp: 99.5 F (37.5 C)   TempSrc: Oral   Resp: 22 20  Height: 5\' 8"  (1.727 m)   Weight: 215 lb (97.523 kg)   SpO2: 96% 98%     MDM    Final diagnoses:  Dog bite  Tenosynovitis, purulent   [name] is a 73 y.o. male presents to the ED with a dog bite to his right hand sustained 3 days ago. Significant swelling and edema to his dorsal and palmar aspect of his right hand. He has pus tracking from his second digit. He has decreased range of motion of his second digit MCP joint. Suspect tenosynovitis.  Cultures were taken of his pus drainage. Unasyn was started in the ED. This choice was discussed with Dr. Donnald GarrePfeiffer and we agree that the patient does not have an allergy to Unasyn  based on talking with him. Dr. Donnald GarrePfeiffer consulted hand surgeon Dr.Gramig who would like the patient to be sent [facility] immediately so he can be taken to surgery. The spouse the patient agree to take him by personal vehicle. An ED to ED transfer was arranged. Dr. Donnald GarrePfeiffer spoke with Dr[facility] Fonnie [facility]Bednar at [facility] and they're expecting him. Advised patient and spouse to report [facility] immediately. The patient and spouse verbalized understanding and agreement with plan. The patient was seen and evaluated in conjunction with Dr. Donnald GarrePfeiffer.     Lawana ChambersWilliam Duncan Drusilla [place], [place] 04/20/14 [phone]

## 2014-04-20 NOTE — ED Notes (Signed)
Dr. Amanda PeaGramig has been notified that patient has arrived

## 2014-04-20 NOTE — ED Provider Notes (Signed)
Medical screening examination/treatment/procedure(s) were conducted as a shared visit with non-physician practitioner(s) and myself.  I personally evaluated the patient during the encounter.   EKG Interpretation None     See dictated note  Arby BarretteMarcy Montez Cuda, MD 04/20/14 2337

## 2014-04-20 NOTE — Anesthesia Preprocedure Evaluation (Signed)
Anesthesia Evaluation  Patient identified by MRN, date of birth, ID band Patient awake    Reviewed: Allergy & Precautions, H&P , NPO status , Patient's Chart, lab work & pertinent test results  Airway Mallampati: I  TM Distance: >3 FB Neck ROM: Full    Dental   Pulmonary former smoker,          Cardiovascular hypertension, Pt. on medications     Neuro/Psych    GI/Hepatic   Endo/Other    Renal/GU      Musculoskeletal   Abdominal   Peds  Hematology   Anesthesia Other Findings   Reproductive/Obstetrics                             Anesthesia Physical Anesthesia Plan  ASA: II and emergent  Anesthesia Plan: General   Post-op Pain Management:    Induction: Intravenous, Rapid sequence and Cricoid pressure planned  Airway Management Planned: Oral ETT  Additional Equipment:   Intra-op Plan:   Post-operative Plan: Extubation in OR  Informed Consent: I have reviewed the patients History and Physical, chart, labs and discussed the procedure including the risks, benefits and alternatives for the proposed anesthesia with the patient or authorized representative who has indicated his/her understanding and acceptance.     Plan Discussed with: CRNA and Surgeon  Anesthesia Plan Comments:         Anesthesia Quick Evaluation

## 2014-04-20 NOTE — Anesthesia Postprocedure Evaluation (Signed)
Anesthesia Post Note  Patient: [name]  Procedure(s) Performed: Procedure(s) (LRB): IRRIGATION AND DEBRIDEMENT right index finger and right ring finger (Right)  Anesthesia type: general  Patient location: PACU  Post pain: Pain level controlled  Post assessment: Patient's Cardiovascular Status Stable  Last Vitals:  Filed Vitals:   04/20/14 0530  BP: 146/73  Pulse: 87  Temp:   Resp: 13    Post vital signs: Reviewed and stable  Level of consciousness: sedated  Complications: No apparent anesthesia complications

## 2014-04-20 NOTE — H&P (Signed)
[name] is an 73 y.o. male.   Chief Complaint: Patient has a dog bite to the right hand with infection HPI: Patient presents with high rule out flexor tenosynovitis right index finger and abscess right ring finger after dog bite. This is a domesticated cocker spaniel which is his. He's here today with his wife. He denies other complaints. He states he has little high blood pressure but overall is healthy. He notes no prior injury. I reviewed all issues at length. He states he is allergic to penicillin.  At present time he has an acutely infected index finger with classic infectious flexor tenosynovitis symptoms  The patient notes no neck back chest or abdominal pain.  Past Medical History  Diagnosis Date  . Hypertension   . Hyperlipidemia   . Benign prostatic hypertrophy   . Insomnia   . UTI (lower urinary tract infection)   . Seasonal allergies   . Parasomnia 10/14/2011    PSG 12/15/11>>AHI 0.4, SpO2 low 89%, PLMI 16, somnoliloquay, nocturia     History reviewed. No pertinent past surgical history.  Family History  Problem Relation Age of Onset  . Diabetes Other   . Diabetes Mother    Social History:  reports that he has quit smoking. His smoking use included Cigarettes and Cigars. He has a 7 pack-year smoking history. He does not have any smokeless tobacco history on file. He reports that he drinks alcohol. He reports that he does not use illicit drugs.  Allergies:  Allergies  Allergen Reactions  . Atorvastatin     REACTION: leg cramps  . Penicillins Hives  . Sulfonamide Derivatives Hives     (Not in a hospital admission)  Results for orders placed during the hospital encounter of 04/19/14 (from the past 48 hour(s))  CBC     Status: None   Collection Time    04/20/14 12:13 AM      Result Value Ref Range   WBC 8.7  4.0 - 10.5 K/uL   RBC 4.83  4.22 - 5.81 MIL/uL   Hemoglobin 14.8  13.0 - 17.0 g/dL   HCT 41.3  39.0 - 52.0 %   MCV 85.5  78.0 - 100.0 fL   MCH  30.6  26.0 - 34.0 pg   MCHC 35.8  30.0 - 36.0 g/dL   RDW 12.1  11.5 - 15.5 %   Platelets 171  150 - 400 K/uL  BASIC METABOLIC PANEL     Status: Abnormal   Collection Time    04/20/14 12:13 AM      Result Value Ref Range   Sodium 137  137 - 147 mEq/L   Potassium 3.5 (*) 3.7 - 5.3 mEq/L   Chloride 98  96 - 112 mEq/L   CO2 25  19 - 32 mEq/L   Glucose, Bld 125 (*) 70 - 99 mg/dL   BUN 14  6 - 23 mg/dL   Creatinine, Ser 1.01  0.50 - 1.35 mg/dL   Calcium 8.9  8.4 - 10.5 mg/dL   GFR calc non Af Amer 71 (*) >90 mL/min   GFR calc Af Amer 82 (*) >90 mL/min   Comment: (NOTE)     The eGFR has been calculated using the CKD EPI equation.     This calculation has not been validated in all clinical situations.     eGFR's persistently <90 mL/min signify possible Chronic Kidney     Disease.   Anion gap 14  5 - 15   Dg  Hand Complete Right  04/20/2014   CLINICAL DATA:  Recent right index finger puncture wound on radial side status post dog bite. Now hand is swollen, inflamed, red, and painful.  EXAM: RIGHT HAND - COMPLETE 3+ VIEW  COMPARISON:  None.  FINDINGS: Soft tissue swelling of the hand, most pronounced along the second digit. No radiopaque foreign body. Tiny amount of radial soft tissue gas at the level of the second PIP is questioned. Mild diffuse osteopenia. First interphalangeal, second PIP and second through fourth DIP mild degenerative changes.  IMPRESSION: Soft tissue swelling of the hand and second digit. There may be a tiny amount of soft tissue gas at the reported puncture site. No radiopaque foreign body.  Osteopenia and mild degenerative changes as above. No acute osseous finding of the right hand.   Electronically Signed   By: Carlos Levering M.D.   On: 04/20/2014 00:15    Review of Systems  Eyes: Negative.   Respiratory: Negative.   Cardiovascular: Negative.   Gastrointestinal: Negative.   Genitourinary: Negative.   Neurological: Negative.   Endo/Heme/Allergies: Negative.    Psychiatric/Behavioral: Negative.     Blood pressure 163/84, pulse 100, temperature 98.4 F (36.9 C), temperature source Oral, resp. rate 18, height _0  (1.727 m), weight 97.523 kg (215 lb), SpO2 98.00%. Physical Exam acutely infectious right index finger tenosynovitis with classic [place] signs He also has a small abscess about the ring finger.  His hand looks Berry erythematous and swollen. He is obviously acutely infected. He does have refill to the tips of the fingers however there is some mottling.  The entrance site of the bites look rather necrotic and with noted tissue loss  Chest is clear. Heart regular rate. Abdomen is protuberant nontender nondistended. HEENT is within normal limits. Lower extremity examination is benign. Neck and back are nontender.  Assessment/Plan We will plan for emergent irrigation and debridement right index finger and ring finger. He will likely need a series of washouts IV antibiotics and aggressive measures try and salvage his finger. Unfortunately this occurred Sunday which is days ago. I have discussed with the patient all issues plans and options. With this in mind he desires to proceed.  Fareeha Evon III,Alastor Kneale M 04/20/2014, 2:13 AM

## 2014-04-20 NOTE — Op Note (Signed)
NAMTula Nakayama:  Nims, Andrey                ACCOUNT NO.:  [id number]  MEDICAL RECORD NO.:  [id number]  LOCATION:  [facility]                         FACILITY:  [facility]  PHYSICIAN:  Dionne AnoWilliam M. Vanassa Penniman, M.D.DATE OF BIRTH:  1941/04/05  DATE OF PROCEDURE: DATE OF DISCHARGE:                              OPERATIVE REPORT   PREOPERATIVE DIAGNOSES: 1. Right index finger purulent flexor tenosynovitis after dog bite. 2. Right ring finger abscess at the distal interphalangeal joint (DIP)     joint.  POSTOPERATIVE DIAGNOSES: 1. Right index finger purulent flexor tenosynovitis after dog bite. 2. Right ring finger abscess at the distal interphalangeal joint (DIP)     joint.  PROCEDURES: 1. Irrigation and debridement of deep abscess, right index finger. 2. I and D purulent flexor tenosynovitis, right index finger. 3. Flexor tenolysis, tenosynovectomy, right index finger, involving     the FDP and FDS tendons with major aggressive debridement. 4. Irrigation and debridement excisional in nature, right ring finger,     distal interphalangeal joint and extensor tendon sheath.  This was     a separate I and D of an extensor tenosynovitis, infectious in     nature and the distal interphalangeal joint with arthrotomy and     synovectomy being performed.  SURGEON:  Dionne AnoWilliam M. Amanda PeaGramig, M.D.  ASSISTANT:  None.  COMPLICATIONS:  None.  ANESTHESIA:  General.  TOURNIQUET TIME:  Less than an hour.  INDICATIONS FOR PROCEDURE:  This patient is a 73 year old male status post dog bite of the __________ with resultant severe infection in the ring finger distal interphalangeal joint extensor sheath as well as flexor sheath about the index finger.  He has multiple bites, cannot move his fingers, and horrible pain and problems.  He is consented for emergent I and D and repair and reconstruction as necessary.  OPERATION IN DETAIL:  The patient was seen by myself and Anesthesia, taken to the operative suite, underwent smooth  induction of general anesthetic.  Following this, the patient then underwent careful and cautious Betadine scrub and paint to the right hand and forearm. Following this, the patient had a final time-out called.  Arm was elevated, tourniquet was insufflated to 250 mmHg.  I performed an I and D of the ring finger first.  We opened the wound.  This wound was opened ulnarly where the bite was located.  It was also a volar bite in the pulp and this was opened as well.  I performed an extensor tenolysis, tenosynovectomy, and cultured this with aerobic cultures.  Following this, we then entered the distal interphalangeal joint, performed an arthrotomy and synovectomy, and cleaned this out aggressively.  Thus, arthrotomy and synovectomy of the ring finger DIP joint and extensor decompression and tenosynovectomy was accomplished.  Following this, the patient then underwent a very careful and cautious approach to the index finger.  The patient had the distal interphalangeal joint open in a chevron/Bruner incision.  The 2 bite marks medial and lateral, about the DIP joint, were tracked and probed with a facial nerve dissector and following this, the patient had a very small blue vessel loop placed through and through.  Once this was done, I accessed the  flexor sheath to the main Bruner incision, opened this, noted purulence, cultured this and opened up the pulley system in this region.  Following this, attention was turned towards the palm, where the patient underwent a very careful and cautious approach to the palm with incision followed by dissection, carefully avoiding neurovascular bundles.  The flexor sheath was full of purulent material.  This was opened, A1 and portions of A2 were released.  Aggressive debridement, tenolysis, tenosynovectomy was accomplished and decompression of the area performed.  I irrigated with 3 L of saline at this time about all areas, and following this, placed a  pediatric feeding catheter in the flexor tendon sheath region. A flexor tenolysis, tenosynovectomy was accomplished as well as a major aggressive debridement of the flexor tendons and placement of a pediatric feeding catheter.  The patient tolerated this reasonably well. The abscess sites distally, about the distal interphalangeal joint, were left open with __________ drain.  Following this, we continued further irrigation and flushing of the sheath system and I then sutured in the pediatric feeding catheter and I will continue continuous catheter irrigation.  The patient tolerated this reasonably well.  The tourniquet was deflated during the irrigation portion of the procedure and the patient did have good refill to the finger fortunately.  The wounds were left wide open to allow for the aggression of purulence as he was highly infected.  Following this, the patient then underwent a very careful and cautious approach to the dressing application of Adaptic, Xeroform gauze, 4x4s, Webril, Kerlix, and a volar plaster splint.  He was taken to the procedural suite, postop care unit and following this, will be admitted to the floor.  We will watch him carefully.  I am going to place him on Cipro and clindamycin, given this penicillin allergy and the dog bite.  We will await cultures.  Plan for repeat I and D, and move accordingly.  All sponge, needle, and instrument counts reported as correct.  I would give him a guarded prognosis as this was a highly infected hand and certainly there has been a delay in his presentation, given the time frame duration from bite injury.  Nevertheless, we will do everything in our power to help them.  I would expect additional washouts and we will have to see how he does.  I have discussed all issues with him and his wife and all questions have been encouraged and answered.     Dionne AnoWilliam M. Amanda PeaGramig, M.D.     [facility]  D:  04/20/2014  T:  04/20/2014  Job:   [id number]

## 2014-04-20 NOTE — ED Notes (Signed)
[facility]. [facility] by Lafonda MossesWLED Charge RN. Pt IV still in place due to immediate hand surgery [facility] (per [facility]. and [facility] charge)

## 2014-04-20 NOTE — Progress Notes (Signed)
Patient ID: [name], male   DOB: 06/22/1941, 73 y.o.   MRN: [id number] Patient is stable. Patient is just arriving to the floor. We'll plan for repeat I and D tomorrow. I give this a variable prognosis at this juncture. We will continue his close catheter irrigation sheath system.  Jarrod Mcenery M.D.

## 2014-04-20 NOTE — Progress Notes (Signed)
Spoke with PA Dansie and Dr Donnald GarrePfeiffer about PCN allergy= hives. They interviewed pt and he said only involved hands. Dr Broadus JohnPfieffer wants to proceed with Unasyn.   Lorenza EvangelistGreen, Tewana Bohlen R 04/20/2014

## 2014-04-20 NOTE — Anesthesia Procedure Notes (Signed)
Procedure Name: Intubation Date/Time: 04/20/2014 2:25 AM Performed by: Julianne RiceBILOTTA, Larry Ala Z Pre-anesthesia Checklist: Patient identified, Timeout performed, Emergency Drugs available, Suction available and Patient being monitored Patient Re-evaluated:Patient Re-evaluated prior to inductionOxygen Delivery Method: Circle system utilized Preoxygenation: Pre-oxygenation with 100% oxygen Intubation Type: IV induction, Rapid sequence and Cricoid Pressure applied Laryngoscope Size: 4 and Mac Grade View: Grade I Tube type: Oral Tube size: 7.5 mm Number of attempts: 1 Airway Equipment and Method: Stylet Placement Confirmation: ETT inserted through vocal cords under direct vision,  breath sounds checked- equal and bilateral and positive ETCO2 Secured at: 22 cm Tube secured with: Tape Dental Injury: Teeth and Oropharynx as per pre-operative assessment

## 2014-04-20 NOTE — Progress Notes (Signed)
Pt's IV pump was beeping, upon entering room pt was ambulating and I noticed pt had ripped out the pediatric feeding tube that was placed in his wound to provide pain medicine.  Orthopedic PA notified and said they could not replace it now and to monitor pt.  Will continue to monitor. [name], Jazmin Ley L

## 2014-04-20 NOTE — ED Notes (Signed)
Pt to OR. Amanda PeaGramig, MD assisted with transfer.

## 2014-04-20 NOTE — Progress Notes (Signed)
Dietary called about getting pt a tray to PACU.

## 2014-04-20 NOTE — Transfer of Care (Signed)
Immediate Anesthesia Transfer of Care Note  Patient: [name]  Procedure(s) Performed: Procedure(s): IRRIGATION AND DEBRIDEMENT right index finger and right ring finger (Right)  Patient Location: PACU  Anesthesia Type:General  Level of Consciousness: awake and alert   Airway & Oxygen Therapy: Patient Spontanous Breathing and Patient connected to nasal cannula oxygen  Post-op Assessment: Report given to PACU RN and Post -op Vital signs reviewed and stable  Post vital signs: Reviewed and stable  Complications: No apparent anesthesia complications

## 2014-04-20 NOTE — Op Note (Signed)
See dictation#[id number] Amanda PeaGramig MD

## 2014-04-20 NOTE — ED Notes (Signed)
Gramig, MD at bedside.  

## 2014-04-20 NOTE — ED Provider Notes (Signed)
Pt met and examined by Dr. Amedeo Plenty on Pt arrival to ED with OR ready for Pt. 0155  Babette Relic, MD 04/20/14 [phone]

## 2014-04-21 ENCOUNTER — Encounter (HOSPITAL_COMMUNITY): Payer: Medicare Other | Admitting: Anesthesiology

## 2014-04-21 ENCOUNTER — Ambulatory Visit: Admit: 2014-04-21 | Payer: Self-pay | Admitting: Orthopedic Surgery

## 2014-04-21 ENCOUNTER — Inpatient Hospital Stay (HOSPITAL_COMMUNITY): Payer: Medicare Other | Admitting: Anesthesiology

## 2014-04-21 ENCOUNTER — Encounter (HOSPITAL_COMMUNITY): Payer: Self-pay | Admitting: Anesthesiology

## 2014-04-21 ENCOUNTER — Encounter (HOSPITAL_COMMUNITY)
Admission: EM | Disposition: A | Discharge status: Home / Self Care | Payer: Self-pay | Source: Home / Self Care | Attending: Orthopedic Surgery

## 2014-04-21 HISTORY — PX: I&D EXTREMITY: SHX5045

## 2014-04-21 SURGERY — IRRIGATION AND DEBRIDEMENT EXTREMITY
Anesthesia: General | Site: Hand | Laterality: Right

## 2014-04-21 MED ORDER — HYDROMORPHONE HCL 1 MG/ML IJ SOLN
0.2500 mg | INTRAMUSCULAR | Status: DC | PRN
  Administered 2014-04-21 (×2): 0.5 mg via INTRAVENOUS

## 2014-04-21 MED ORDER — ONDANSETRON HCL 4 MG/2ML IJ SOLN
INTRAMUSCULAR | Status: DC | PRN
  Administered 2014-04-21: 4 mg via INTRAVENOUS

## 2014-04-21 MED ORDER — FENTANYL CITRATE 0.05 MG/ML IJ SOLN
INTRAMUSCULAR | Status: AC
  Filled 2014-04-21: qty 5

## 2014-04-21 MED ORDER — HYDROMORPHONE HCL 1 MG/ML IJ SOLN
INTRAMUSCULAR | Status: AC
  Filled 2014-04-21: qty 1

## 2014-04-21 MED ORDER — PROPOFOL 10 MG/ML IV BOLUS
INTRAVENOUS | Status: AC
  Filled 2014-04-21: qty 20

## 2014-04-21 MED ORDER — SODIUM CHLORIDE 0.9 % IR SOLN
Status: DC | PRN
  Administered 2014-04-21: 3000 mL

## 2014-04-21 MED ORDER — ONDANSETRON HCL 4 MG/2ML IJ SOLN: 4.0000 mg | Freq: Once | INTRAMUSCULAR | Status: DC | PRN

## 2014-04-21 MED ORDER — LACTATED RINGERS IV SOLN
INTRAVENOUS | Status: DC
  Administered 2014-04-21: 18:00:00 via INTRAVENOUS

## 2014-04-21 MED ORDER — PROPOFOL 10 MG/ML IV BOLUS
INTRAVENOUS | Status: DC | PRN
  Administered 2014-04-21: 200 mg via INTRAVENOUS

## 2014-04-21 MED ORDER — FENTANYL CITRATE 0.05 MG/ML IJ SOLN
INTRAMUSCULAR | Status: DC | PRN
  Administered 2014-04-21 (×3): 25 ug via INTRAVENOUS

## 2014-04-21 MED ORDER — LACTATED RINGERS IV SOLN
INTRAVENOUS | Status: DC | PRN
  Administered 2014-04-21: 18:00:00 via INTRAVENOUS

## 2014-04-21 MED ORDER — EPHEDRINE SULFATE 50 MG/ML IJ SOLN
INTRAMUSCULAR | Status: DC | PRN
  Administered 2014-04-21 (×2): 5 mg via INTRAVENOUS

## 2014-04-21 MED ORDER — LIDOCAINE HCL (CARDIAC) 20 MG/ML IV SOLN
INTRAVENOUS | Status: DC | PRN
  Administered 2014-04-21: 50 mg via INTRAVENOUS

## 2014-04-21 MED ORDER — MIDAZOLAM HCL 2 MG/2ML IJ SOLN
INTRAMUSCULAR | Status: AC
  Filled 2014-04-21: qty 2

## 2014-04-21 SURGICAL SUPPLY — 46 items
BANDAGE ELASTIC 3 VELCRO ST LF (GAUZE/BANDAGES/DRESSINGS) ×3 IMPLANT
BANDAGE ELASTIC 4 VELCRO ST LF (GAUZE/BANDAGES/DRESSINGS) ×3 IMPLANT
BNDG CONFORM 2 STRL LF (GAUZE/BANDAGES/DRESSINGS) ×6 IMPLANT
BNDG GAUZE ELAST 4 BULKY (GAUZE/BANDAGES/DRESSINGS) ×3 IMPLANT
CORDS BIPOLAR (ELECTRODE) ×3 IMPLANT
CUFF TOURNIQUET SINGLE 18IN (TOURNIQUET CUFF) ×3 IMPLANT
CUFF TOURNIQUET SINGLE 24IN (TOURNIQUET CUFF) IMPLANT
DRSG ADAPTIC 3X8 NADH LF (GAUZE/BANDAGES/DRESSINGS) IMPLANT
DRSG EMULSION OIL 3X3 NADH (GAUZE/BANDAGES/DRESSINGS) ×3 IMPLANT
ELECT REM PT RETURN 9FT ADLT (ELECTROSURGICAL)
ELECTRODE REM PT RTRN 9FT ADLT (ELECTROSURGICAL) IMPLANT
GAUZE SPONGE 4X4 12PLY STRL (GAUZE/BANDAGES/DRESSINGS) IMPLANT
GAUZE XEROFORM 1X8 LF (GAUZE/BANDAGES/DRESSINGS) ×3 IMPLANT
GLOVE BIOGEL M STRL SZ7.5 (GLOVE) IMPLANT
GLOVE SS BIOGEL STRL SZ 8 (GLOVE) IMPLANT
GLOVE SUPERSENSE BIOGEL SZ 8 (GLOVE)
GOWN STRL REUS W/ TWL LRG LVL3 (GOWN DISPOSABLE) ×2 IMPLANT
GOWN STRL REUS W/ TWL XL LVL3 (GOWN DISPOSABLE) IMPLANT
GOWN STRL REUS W/TWL LRG LVL3 (GOWN DISPOSABLE) ×4
GOWN STRL REUS W/TWL XL LVL3 (GOWN DISPOSABLE)
HANDPIECE INTERPULSE COAX TIP (DISPOSABLE)
KIT BASIN OR (CUSTOM PROCEDURE TRAY) ×3 IMPLANT
KIT ROOM TURNOVER OR (KITS) ×3 IMPLANT
LOOP VESSEL MAXI BLUE (MISCELLANEOUS) ×3 IMPLANT
MANIFOLD NEPTUNE II (INSTRUMENTS) ×3 IMPLANT
NEEDLE HYPO 25GX1X1/2 BEV (NEEDLE) IMPLANT
NS IRRIG 1000ML POUR BTL (IV SOLUTION) ×3 IMPLANT
PACK ORTHO EXTREMITY (CUSTOM PROCEDURE TRAY) ×3 IMPLANT
PAD ARMBOARD 7.5X6 YLW CONV (MISCELLANEOUS) ×6 IMPLANT
PAD CAST 4YDX4 CTTN HI CHSV (CAST SUPPLIES) IMPLANT
PADDING CAST COTTON 4X4 STRL (CAST SUPPLIES)
SET HNDPC FAN SPRY TIP SCT (DISPOSABLE) IMPLANT
SPLINT FIBERGLASS 3X12 (CAST SUPPLIES) ×3 IMPLANT
SPONGE GAUZE 4X4 12PLY STER LF (GAUZE/BANDAGES/DRESSINGS) ×3 IMPLANT
SPONGE LAP 18X18 X RAY DECT (DISPOSABLE) IMPLANT
SPONGE LAP 4X18 X RAY DECT (DISPOSABLE) IMPLANT
SUT CHROMIC 4 0 PS 2 18 (SUTURE) ×3 IMPLANT
SYR CONTROL 10ML LL (SYRINGE) IMPLANT
TOWEL OR 17X24 6PK STRL BLUE (TOWEL DISPOSABLE) ×3 IMPLANT
TOWEL OR 17X26 10 PK STRL BLUE (TOWEL DISPOSABLE) ×3 IMPLANT
TUBE ANAEROBIC SPECIMEN COL (MISCELLANEOUS) IMPLANT
TUBE CONNECTING 12'X1/4 (SUCTIONS)
TUBE CONNECTING 12X1/4 (SUCTIONS) IMPLANT
TUBE FEEDING 5FR 15 INCH (TUBING) ×3 IMPLANT
WATER STERILE IRR 1000ML POUR (IV SOLUTION) ×3 IMPLANT
YANKAUER SUCT BULB TIP NO VENT (SUCTIONS) IMPLANT

## 2014-04-21 NOTE — Op Note (Signed)
See ZOXWRUEAV#[id number]#[id number] Amanda PeaGramig MD

## 2014-04-21 NOTE — Anesthesia Preprocedure Evaluation (Signed)
Anesthesia Evaluation  Patient identified by MRN, date of birth, ID band Patient awake    Reviewed: Allergy & Precautions, H&P , NPO status , Patient's Chart, lab work & pertinent test results, reviewed documented beta blocker date and time   History of Anesthesia Complications Negative for: history of anesthetic complications  Airway Mallampati: I  TM Distance: >3 FB Neck ROM: Full    Dental  (+) Edentulous Upper, Edentulous Lower   Pulmonary COPDformer smoker,  breath sounds clear to auscultation        Cardiovascular hypertension, Pt. on medications - anginaRhythm:Regular Rate:Normal     Neuro/Psych negative neurological ROS     GI/Hepatic negative GI ROS, Neg liver ROS,   Endo/Other  Morbid obesity  Renal/GU negative Renal ROS     Musculoskeletal   Abdominal (+) + obese,   Peds  Hematology negative hematology ROS (+)   Anesthesia Other Findings   Reproductive/Obstetrics                             Anesthesia Physical Anesthesia Plan  ASA: II  Anesthesia Plan: General   Post-op Pain Management:    Induction: Intravenous  Airway Management Planned: LMA  Additional Equipment:   Intra-op Plan:   Post-operative Plan:   Informed Consent: I have reviewed the patients History and Physical, chart, labs and discussed the procedure including the risks, benefits and alternatives for the proposed anesthesia with the patient or authorized representative who has indicated his/her understanding and acceptance.     Plan Discussed with: CRNA and Surgeon  Anesthesia Plan Comments: (Plan routine monitors, GA- LMA OK)        Anesthesia Quick Evaluation

## 2014-04-21 NOTE — Transfer of Care (Signed)
Immediate Anesthesia Transfer of Care Note  Patient: [name]  Procedure(s) Performed: Procedure(s): IRRIGATION AND DEBRIDEMENT RIGHT HAND RING FINGER, INDEX FINGER AND Flexor Tenolysis and FDS Tenotomy (Right)  Patient Location: PACU  Anesthesia Type:General  Level of Consciousness: awake  Airway & Oxygen Therapy: Patient Spontanous Breathing and Patient connected to nasal cannula oxygen  Post-op Assessment: Report given to PACU RN and Post -op Vital signs reviewed and stable  Post vital signs: Reviewed and stable  Complications: No apparent anesthesia complications

## 2014-04-21 NOTE — H&P (Signed)
  Presents for repeat I&D Understands risks and benefits  The patient is alert and oriented in no acute distress the patient complains of pain in the affected upper extremity.  The patient is noted to have a normal HEENT exam.  Lung fields show equal chest expansion and no shortness of breath  abdomen exam is nontender without distention.  Lower extremity examination does not show any fracture dislocation or blood clot symptoms.  Pelvis is stable neck and back are stable and nontender  We are planning surgery for your upper extremity. The risk and benefits of surgery include risk of bleeding infection anesthesia damage to normal structures and failure of the surgery to accomplish its intended goals of relieving symptoms and restoring function with this in mind we'll going to proceed. I have specifically discussed with the patient the pre-and postoperative regime and the does and don'ts and risk and benefits in great detail. Risk and benefits of surgery also include risk of dystrophy chronic nerve pain failure of the healing process to go onto completion and other inherent risks of surgery The relavent the pathophysiology of the disease/injury process, as well as the alternatives for treatment and postoperative course of action has been discussed in great detail with the patient who desires to proceed.  We will do everything in our power to help you (the patient) restore function to the upper extremity. Is a pleasure to see this patient today.  Oseias Horsey MD

## 2014-04-21 NOTE — Anesthesia Postprocedure Evaluation (Signed)
  Anesthesia Post-op Note  Patient: [name]  Procedure(s) Performed: Procedure(s): IRRIGATION AND DEBRIDEMENT RIGHT HAND RING FINGER, INDEX FINGER AND Flexor Tenolysis and FDS Tenotomy (Right)  Patient Location: PACU  Anesthesia Type:General  Level of Consciousness: awake, alert , oriented and patient cooperative  Airway and Oxygen Therapy: Patient Spontanous Breathing  Post-op Pain: mild  Post-op Assessment: Post-op Vital signs reviewed, Patient's Cardiovascular Status Stable, Respiratory Function Stable, Patent Airway, No signs of Nausea or vomiting and Pain level controlled  Post-op Vital Signs: stable  Last Vitals:  Filed Vitals:   04/21/14 1915  BP: 174/87  Pulse: 97  Temp: 36.6 C  Resp: 19    Complications: No apparent anesthesia complications

## 2014-04-22 ENCOUNTER — Encounter (HOSPITAL_COMMUNITY): Payer: Self-pay | Admitting: Orthopedic Surgery

## 2014-04-22 LAB — WOUND CULTURE
Gram Stain: NONE SEEN
Special Requests: NORMAL

## 2014-04-22 LAB — TISSUE CULTURE

## 2014-04-22 NOTE — Progress Notes (Signed)
Subjective: 1 Day Post-Op Procedure(s) (LRB): IRRIGATION AND DEBRIDEMENT RIGHT HAND RING FINGER, INDEX FINGER AND Flexor Tenolysis and FDS Tenotomy (Right) Patient reports pain as mild.    Objective: Vital signs in last 24 hours: Temp:  [97.4 F (36.3 C)-98 F (36.7 C)] 97.8 F (36.6 C) (10/30 1159) Pulse Rate:  [71-103] 80 (10/30 1159) Resp:  [13-19] 19 (10/30 1159) BP: (126-180)/(62-87) 143/70 mmHg (10/30 1159) SpO2:  [92 %-97 %] 96 % (10/30 1159)  Intake/Output from previous day: 10/29 0701 - 10/30 0700 In: 750 [P.O.:250; I.V.:500] Out: 150 [Urine:150] Intake/Output this shift: Total I/O In: 240 [P.O.:240] Out: -    Recent Labs  04/20/14 0013 04/20/14 1445  HGB 14.8 13.8    Recent Labs  04/20/14 0013 04/20/14 1445  WBC 8.7 7.9  RBC 4.83 4.52  HCT 41.3 38.2*  PLT 171 170    Recent Labs  04/20/14 0013 04/20/14 1445  NA 137 135*  K 3.5* 3.9  CL 98 97  CO2 25 27  BUN 14 13  CREATININE 1.01 0.99  GLUCOSE 125* 129*  CALCIUM 8.9 8.8   No results found for this basename: LABPT, INR,  in the last 72 hours  Neurologically intact ABD soft Neurovascular intact Sensation intact distally Compartment soft  Assessment/Plan: 1 Day Post-Op Procedure(s) (LRB): IRRIGATION AND DEBRIDEMENT RIGHT HAND RING FINGER, INDEX FINGER AND Flexor Tenolysis and FDS Tenotomy (Right) We will plan to check his incisions tomorrow. If improved possible transition to outpatient care. If he is not improved possible further washout.  We are still awaiting cultures.   he is improving and feels better.  [name],Madysun Thall M 04/22/2014, 12:00 PM

## 2014-04-22 NOTE — Care Management Note (Signed)
  Page 1 of 1   04/22/2014     2:00:55 PM CARE MANAGEMENT NOTE 04/22/2014  Patient:  [name],[name]   Account Number:  [id number]  Date Initiated:  04/22/2014  Documentation initiated by:  Ronny FlurryWILE,Marnette Perkins  Subjective/Objective Assessment:     Action/Plan:   Anticipated DC Date:     Anticipated DC Plan:           Choice offered to / List presented to:             Status of service:   Medicare Important Message given?  YES (If response is "NO", the following Medicare IM given date fields will be blank) Date Medicare IM given:  04/22/2014 Medicare IM given by:  Ronny FlurryWILE,Astha Probasco Date Additional Medicare IM given:   Additional Medicare IM given by:    Discharge Disposition:    Per UR Regulation:    If discussed at Long Length of Stay Meetings, dates discussed:    Comments:

## 2014-04-22 NOTE — Op Note (Signed)
NAMTula [name]:  [name]                ACCOUNT NO.:  [id number]  MEDICAL RECORD NO.:  [id number]  LOCATION:  [id number]                        FACILITY:  [facility]  PHYSICIAN:  [name], M.D.DATE OF BIRTH:  1940-11-08  DATE OF PROCEDURE:  04/21/2014 DATE OF DISCHARGE:                              OPERATIVE REPORT   PREOPERATIVE DIAGNOSES: 1. Purulent flexor tenosynovitis, right index finger. 2. Infectious distal interphalangeal joint secondary to dog bite,     right ring finger.  POSTOPERATIVE DIAGNOSES: 1. Purulent flexor tenosynovitis, right index finger. 2. Infectious distal interphalangeal joint secondary to dog bite,     right ring finger.  PROCEDURE: 1. Irrigation and debridement of skin, subcutaneous tissue, tendon,     right index finger. 2. Tenolysis, tenosynovectomy, right index finger flexor digitorum     profundus tendon. 3. Flexor digitorum superficialis tenotomy and excision of right index     finger. 4. Arthrotomy, synovectomy, distal interphalangeal joint, right ring     finger. 5. Extensor tendon debridement, tenosynovectomy, right ring finger.  SURGEON:  [name], M.D.  ASSISTANT:  None.  COMPLICATIONS:  None.  ANESTHESIA:  General.  TOURNIQUET TIME:  Zero.  INDICATIONS FOR THE PROCEDURE:  [name], 73 years of age with a late presentation of a dog bite with infection about the above-mentioned regions as described.  He presents for repeat washout and reconstruction.  OPERATION IN DETAIL:  The patient was seen by myself and Anesthesia. [name] induced general anesthesia.  He was laid supine, fully padded, prepped and draped in usual sterile fashion with Betadine scrub and paint.  Following this, the patient had __________ placed to position him and I performed I and D of skin, subcutaneous tissue, joint, and tendinous tissue about the ring finger dorsoulnar aspect.  I performed arthrotomy, synovectomy of the distal  interphalangeal joint, irrigated this copiously.  Following this, extensor tenolysis, tenosynovectomy was accomplished as well.  This was irrigated with greater than 2 L of saline.  Following this, attention was turned towards the index finger.  We made window openings and performed irrigation of the sheath.  The FDS was highly necrotic and frayed much __________ pus.  I made a small incision, clipped the ulnar and radial FDS __________, and excised the midline portion of the __________ chiasm and then performed FDS resection.  This decompressed the tunnel nicely and allowed for improved gliding characteristics of the FDP.  The FDP (flexor digitorum profundus) underwent a tenolysis, tenosynovectomy, and irrigation.  Following this, new vessel loop drain was placed medial to lateral just volar to the DIP joint through separate bite wounds, which had been previously I and Dd.  At this time, I placed 3 L of saline through the area.  A flexor sheath irrigation system was set up nicely.  Following this, I then very gently closed the wound leaving windows open about the PIP, DIP, and MCP region.  This was loosely closed with chromic.  This was a second washout.  Certainly, his wound conditions are improving, but he certainly has no where __________ quiescent state of affairs.  IV antibiotics, await cultures, and continue aggressive approach to his finger infection __________.  I  have given him guarded prognosis given his late presentation, age, and other issues.  We will continue aggressive measures.  Should any problems occur, I will be immediately available.  Otherwise he tolerated the procedure well.  He was dressed with Adaptic, Xeroform, gauze, Kerlix, Webril, and a volar splint.  It was a pleasure to see him today and participate in his care.     [name], M.D.      [facility]  D:  04/21/2014  T:  04/22/2014  Job:  [id number]

## 2014-04-22 NOTE — Progress Notes (Signed)
Received from ED. Alert and oriented , not in any distress. VSS. Patient for appendectomy. Oriented to staff and unit. In 10 minutes OR calling to transport patient to OR.

## 2014-04-23 MED ORDER — CIPROFLOXACIN HCL 500 MG PO TABS
500.0000 mg | ORAL_TABLET | Freq: Two times a day (BID) | ORAL | Status: DC
  Administered 2014-04-23 – 2014-04-24 (×2): 500 mg via ORAL
  Filled 2014-04-23 (×4): qty 1

## 2014-04-23 NOTE — Progress Notes (Signed)
Patient ID: [name], male   DOB: 12/06/1940, 73 y.o.   MRN: [id number] Patient is doing well.  I changed his bandage at bedside.  Wound conditions are much improved.  He is neurovascularly intact and has had no comp cutting features today.  The infection is certainly improving.  I would recommend an additional day of IV antibiotics and then convert him to a home/outpatient program.  I went over all issues at length.  I called his wife and let her know the plans ahead.  He is growing out Pasteurella on culture  He is comfortable.  Chest is clear abdomen is nontender HEENT is within normal limits. There is no signs of DVT.  Diagnosis status post I and D infected dog bite plan probable discharge tomorrow continue range of motion elevation and postop measures

## 2014-04-24 MED ORDER — CIPROFLOXACIN HCL 500 MG PO TABS: 500.0000 mg | ORAL_TABLET | Freq: Two times a day (BID) | ORAL | Status: DC

## 2014-04-24 MED ORDER — CLINDAMYCIN HCL 300 MG PO CAPS: 300.0000 mg | ORAL_CAPSULE | Freq: Three times a day (TID) | ORAL | Status: DC

## 2014-04-24 NOTE — Discharge Summary (Signed)
Physician Discharge Summary  Patient ID: [name] MRN: [id number] DOB/AGE: 74/10/1940 73 y.o.  Admit date: 04/19/2014 Discharge date: 04/24/2014  Admission Diagnoses:dog bite right hand with infection  Discharge Diagnoses: status post dog bite with infectious tenosynovitis and deep abscess Active Problems:   Infection of right hand due to bite   Discharged Condition: fair  Hospital Course: patient was admitted postop status post irrigation and debridement infected dog bite about the index finger ring finger and hand right upper extremity. He underwent 2 washouts and daily bedside dressing changes. Ultimately he is improved dramatically.  He grew out Pasteurella on culture. He'll be transitioned outpatient care today. He will follow-up in my office. He understands all issues.  At time of discharge she had no evidence of UTI DVT or other problems.  Consults: None  Significant Diagnostic Studies: labs: see chart  Treatments: surgery: C op note  Discharge Exam: Blood pressure 168/88, pulse 72, temperature 97.7 F (36.5 C), temperature source Oral, resp. rate 18, height 5\' 7"  (1.702 m), weight 97.523 kg (215 lb), SpO2 96 %. General appearance: alert  The patient is alert and oriented in no acute distress the patient complains of pain in the affected upper extremity.  The patient is noted to have a normal HEENT exam.  Lung fields show equal chest expansion and no shortness of breath  abdomen exam is nontender without distention.  Lower extremity examination does not show any fracture dislocation or blood clot symptoms.  Pelvis is stable neck and back are stable and nontender  Wound is much better I performed a dressing change at bedside and instructed his wife explicitly on how to change the bandage with nurse present. We will give him bandy supplies he will change the bandage daily.  Overall he is much improved and we are quite pleased to see him looking so  well  Disposition: 01-Home or Self Care     Medication List    ASK your doctor about these medications        amLODipine-benazepril 10-20 MG per capsule  Commonly known as:  LOTREL  Take 1 capsule by mouth daily.     aspirin 81 MG tablet  Take 81 mg by mouth daily.     clonazePAM 0.5 MG tablet  Commonly known as:  KLONOPIN  Take 0.5 mg by mouth at bedtime as needed for anxiety (sleep).     naproxen sodium 220 MG tablet  Commonly known as:  ANAPROX  Take 220 mg by mouth 2 (two) times daily as needed (pain).     potassium chloride 8 MEQ tablet  Commonly known as:  KLOR-CON  Take 1 tablet (8 MEQ total) by mouth daily.     Vitamin D 1000 UNITS capsule  Take 1 capsule (1,000 Units total) by mouth daily.           Follow-up Information    Follow up with Karen ChafeGRAMIG III,Azaan Leask M, MD.   Specialty:  Orthopedic Surgery   Why:  Dr Amanda PeaGramig will call you for your appointment   Contact information:   [address] [place] [id number] [phone]       Signed: Karen ChafeGRAMIG III,Sedrick Tober M 04/24/2014, 10:16 AM

## 2014-04-24 NOTE — Discharge Instructions (Signed)
Please elevate her hand and move your fingers frequently  Please change her dressing daily as instructed by Dr. Amanda PeaGramig  Please take your medicine until it is complete  Please call for any problems  My office will call for your follow up

## 2014-04-25 LAB — ANAEROBIC CULTURE

## 2014-04-26 LAB — CULTURE, BLOOD (ROUTINE X 2)
Culture: NO GROWTH
Culture: NO GROWTH

## 2014-07-07 DIAGNOSIS — N401 Enlarged prostate with lower urinary tract symptoms: Secondary | ICD-10-CM | POA: Diagnosis not present

## 2014-07-08 DIAGNOSIS — R351 Nocturia: Secondary | ICD-10-CM | POA: Diagnosis not present

## 2014-07-08 DIAGNOSIS — N401 Enlarged prostate with lower urinary tract symptoms: Secondary | ICD-10-CM | POA: Diagnosis not present

## 2014-07-16 ENCOUNTER — Other Ambulatory Visit: Payer: Self-pay | Admitting: Internal Medicine

## 2014-07-18 ENCOUNTER — Encounter: Payer: Medicare Other | Admitting: Internal Medicine

## 2014-07-26 ENCOUNTER — Ambulatory Visit (INDEPENDENT_AMBULATORY_CARE_PROVIDER_SITE_OTHER): Payer: Medicare Other | Admitting: Internal Medicine

## 2014-07-26 ENCOUNTER — Encounter: Payer: Self-pay | Admitting: Internal Medicine

## 2014-07-26 VITALS — BP 146/74 | HR 59 | Temp 97.7°F | Wt 212.0 lb

## 2014-07-26 DIAGNOSIS — Z23 Encounter for immunization: Secondary | ICD-10-CM

## 2014-07-26 DIAGNOSIS — Z0000 Encounter for general adult medical examination without abnormal findings: Secondary | ICD-10-CM | POA: Diagnosis not present

## 2014-07-26 DIAGNOSIS — E785 Hyperlipidemia, unspecified: Secondary | ICD-10-CM

## 2014-07-26 MED ORDER — AMLODIPINE BESY-BENAZEPRIL HCL 10-20 MG PO CAPS: 1.0000 | ORAL_CAPSULE | Freq: Every day | ORAL | Status: DC

## 2014-07-26 NOTE — Patient Instructions (Signed)
Preventive Care for Adults A healthy lifestyle and preventive care can promote health and wellness. Preventive health guidelines for men include the following key practices:  A routine yearly physical is a good way to check with your health care provider about your health and preventative screening. It is a chance to share any concerns and updates on your health and to receive a thorough exam.  Visit your dentist for a routine exam and preventative care every 6 months. Brush your teeth twice a day and floss once a day. Good oral hygiene prevents tooth decay and gum disease.  The frequency of eye exams is based on your age, health, family medical history, use of contact lenses, and other factors. Follow your health care provider's recommendations for frequency of eye exams.  Eat a healthy diet. Foods such as vegetables, fruits, whole grains, low-fat dairy products, and lean protein foods contain the nutrients you need without too many calories. Decrease your intake of foods high in solid fats, added sugars, and salt. Eat the right amount of calories for you.Get information about a proper diet from your health care provider, if necessary.  Regular physical exercise is one of the most important things you can do for your health. Most adults should get at least 150 minutes of moderate-intensity exercise (any activity that increases your heart rate and causes you to sweat) each week. In addition, most adults need muscle-strengthening exercises on 2 or more days a week.  Maintain a healthy weight. The body mass index (BMI) is a screening tool to identify possible weight problems. It provides an estimate of body fat based on height and weight. Your health care provider can find your BMI and can help you achieve or maintain a healthy weight.For adults 20 years and older:  A BMI below 18.5 is considered underweight.  A BMI of 18.5 to 24.9 is normal.  A BMI of 25 to 29.9 is considered overweight.  A BMI  of 30 and above is considered obese.  Maintain normal blood lipids and cholesterol levels by exercising and minimizing your intake of saturated fat. Eat a balanced diet with plenty of fruit and vegetables. Blood tests for lipids and cholesterol should begin at age 50 and be repeated every 5 years. If your lipid or cholesterol levels are high, you are over 50, or you are at high risk for heart disease, you may need your cholesterol levels checked more frequently.Ongoing high lipid and cholesterol levels should be treated with medicines if diet and exercise are not working.  If you smoke, find out from your health care provider how to quit. If you do not use tobacco, do not start.  Lung cancer screening is recommended for adults aged 73-80 years who are at high risk for developing lung cancer because of a history of smoking. A yearly low-dose CT scan of the lungs is recommended for people who have at least a 30-pack-year history of smoking and are a current smoker or have quit within the past 15 years. A pack year of smoking is smoking an average of 1 pack of cigarettes a day for 1 year (for example: 1 pack a day for 30 years or 2 packs a day for 15 years). Yearly screening should continue until the smoker has stopped smoking for at least 15 years. Yearly screening should be stopped for people who develop a health problem that would prevent them from having lung cancer treatment.  If you choose to drink alcohol, do not have more than  2 drinks per day. One drink is considered to be 12 ounces (355 mL) of beer, 5 ounces (148 mL) of wine, or 1.5 ounces (44 mL) of liquor.  Avoid use of street drugs. Do not share needles with anyone. Ask for help if you need support or instructions about stopping the use of drugs.  High blood pressure causes heart disease and increases the risk of stroke. Your blood pressure should be checked at least every 1-2 years. Ongoing high blood pressure should be treated with  medicines, if weight loss and exercise are not effective.  If you are 45-79 years old, ask your health care provider if you should take aspirin to prevent heart disease.  Diabetes screening involves taking a blood sample to check your fasting blood sugar level. This should be done once every 3 years, after age 45, if you are within normal weight and without risk factors for diabetes. Testing should be considered at a younger age or be carried out more frequently if you are overweight and have at least 1 risk factor for diabetes.  Colorectal cancer can be detected and often prevented. Most routine colorectal cancer screening begins at the age of 50 and continues through age 75. However, your health care provider may recommend screening at an earlier age if you have risk factors for colon cancer. On a yearly basis, your health care provider may provide home test kits to check for hidden blood in the stool. Use of a small camera at the end of a tube to directly examine the colon (sigmoidoscopy or colonoscopy) can detect the earliest forms of colorectal cancer. Talk to your health care provider about this at age 50, when routine screening begins. Direct exam of the colon should be repeated every 5-10 years through age 75, unless early forms of precancerous polyps or small growths are found.  People who are at an increased risk for hepatitis B should be screened for this virus. You are considered at high risk for hepatitis B if:  You were born in a country where hepatitis B occurs often. Talk with your health care provider about which countries are considered high risk.  Your parents were born in a high-risk country and you have not received a shot to protect against hepatitis B (hepatitis B vaccine).  You have HIV or AIDS.  You use needles to inject street drugs.  You live with, or have sex with, someone who has hepatitis B.  You are a man who has sex with other men (MSM).  You get hemodialysis  treatment.  You take certain medicines for conditions such as cancer, organ transplantation, and autoimmune conditions.  Hepatitis C blood testing is recommended for all people born from 1945 through 1965 and any individual with known risks for hepatitis C.  Practice safe sex. Use condoms and avoid high-risk sexual practices to reduce the spread of sexually transmitted infections (STIs). STIs include gonorrhea, chlamydia, syphilis, trichomonas, herpes, HPV, and human immunodeficiency virus (HIV). Herpes, HIV, and HPV are viral illnesses that have no cure. They can result in disability, cancer, and death.  If you are at risk of being infected with HIV, it is recommended that you take a prescription medicine daily to prevent HIV infection. This is called preexposure prophylaxis (PrEP). You are considered at risk if:  You are a man who has sex with other men (MSM) and have other risk factors.  You are a heterosexual man, are sexually active, and are at increased risk for HIV infection.    You take drugs by injection.  You are sexually active with a partner who has HIV.  Talk with your health care provider about whether you are at high risk of being infected with HIV. If you choose to begin PrEP, you should first be tested for HIV. You should then be tested every 3 months for as long as you are taking PrEP.  A one-time screening for abdominal aortic aneurysm (AAA) and surgical repair of large AAAs by ultrasound are recommended for men ages 32 to 67 years who are current or former smokers.  Healthy men should no longer receive prostate-specific antigen (PSA) blood tests as part of routine cancer screening. Talk with your health care provider about prostate cancer screening.  Testicular cancer screening is not recommended for adult males who have no symptoms. Screening includes self-exam, a health care provider exam, and other screening tests. Consult with your health care provider about any symptoms  you have or any concerns you have about testicular cancer.  Use sunscreen. Apply sunscreen liberally and repeatedly throughout the day. You should seek shade when your shadow is shorter than you. Protect yourself by wearing long sleeves, pants, a wide-brimmed hat, and sunglasses year round, whenever you are outdoors.  Once a month, do a whole-body skin exam, using a mirror to look at the skin on your back. Tell your health care provider about new moles, moles that have irregular borders, moles that are larger than a pencil eraser, or moles that have changed in shape or color.  Stay current with required vaccines (immunizations).  Influenza vaccine. All adults should be immunized every year.  Tetanus, diphtheria, and acellular pertussis (Td, Tdap) vaccine. An adult who has not previously received Tdap or who does not know his vaccine status should receive 1 dose of Tdap. This initial dose should be followed by tetanus and diphtheria toxoids (Td) booster doses every 10 years. Adults with an unknown or incomplete history of completing a 3-dose immunization series with Td-containing vaccines should begin or complete a primary immunization series including a Tdap dose. Adults should receive a Td booster every 10 years.  Varicella vaccine. An adult without evidence of immunity to varicella should receive 2 doses or a second dose if he has previously received 1 dose.  Human papillomavirus (HPV) vaccine. Males aged 68-21 years who have not received the vaccine previously should receive the 3-dose series. Males aged 22-26 years may be immunized. Immunization is recommended through the age of 6 years for any male who has sex with males and did not get any or all doses earlier. Immunization is recommended for any person with an immunocompromised condition through the age of 49 years if he did not get any or all doses earlier. During the 3-dose series, the second dose should be obtained 4-8 weeks after the first  dose. The third dose should be obtained 24 weeks after the first dose and 16 weeks after the second dose.  Zoster vaccine. One dose is recommended for adults aged 50 years or older unless certain conditions are present.  Measles, mumps, and rubella (MMR) vaccine. Adults born before [birth year] generally are considered immune to measles and mumps. Adults born in [birth year] or later should have 1 or more doses of MMR vaccine unless there is a contraindication to the vaccine or there is laboratory evidence of immunity to each of the three diseases. A routine second dose of MMR vaccine should be obtained at least 28 days after the first dose for students attending postsecondary  schools, health care workers, or international travelers. People who received inactivated measles vaccine or an unknown type of measles vaccine during 1963-1967 should receive 2 doses of MMR vaccine. People who received inactivated mumps vaccine or an unknown type of mumps vaccine before 1979 and are at high risk for mumps infection should consider immunization with 2 doses of MMR vaccine. Unvaccinated health care workers born before 1957 who lack laboratory evidence of measles, mumps, or rubella immunity or laboratory confirmation of disease should consider measles and mumps immunization with 2 doses of MMR vaccine or rubella immunization with 1 dose of MMR vaccine.  Pneumococcal 13-valent conjugate (PCV13) vaccine. When indicated, a person who is uncertain of his immunization history and has no record of immunization should receive the PCV13 vaccine. An adult aged 19 years or older who has certain medical conditions and has not been previously immunized should receive 1 dose of PCV13 vaccine. This PCV13 should be followed with a dose of pneumococcal polysaccharide (PPSV23) vaccine. The PPSV23 vaccine dose should be obtained at least 8 weeks after the dose of PCV13 vaccine. An adult aged 19 years or older who has certain medical conditions and  previously received 1 or more doses of PPSV23 vaccine should receive 1 dose of PCV13. The PCV13 vaccine dose should be obtained 1 or more years after the last PPSV23 vaccine dose.  Pneumococcal polysaccharide (PPSV23) vaccine. When PCV13 is also indicated, PCV13 should be obtained first. All adults aged 65 years and older should be immunized. An adult younger than age 65 years who has certain medical conditions should be immunized. Any person who resides in a nursing home or long-term care facility should be immunized. An adult smoker should be immunized. People with an immunocompromised condition and certain other conditions should receive both PCV13 and PPSV23 vaccines. People with human immunodeficiency virus (HIV) infection should be immunized as soon as possible after diagnosis. Immunization during chemotherapy or radiation therapy should be avoided. Routine use of PPSV23 vaccine is not recommended for American Indians, [place] Natives, or people younger than 65 years unless there are medical conditions that require PPSV23 vaccine. When indicated, people who have unknown immunization and have no record of immunization should receive PPSV23 vaccine. One-time revaccination 5 years after the first dose of PPSV23 is recommended for people aged 19-64 years who have chronic kidney failure, nephrotic syndrome, asplenia, or immunocompromised conditions. People who received 1-2 doses of PPSV23 before age 65 years should receive another dose of PPSV23 vaccine at age 65 years or later if at least 5 years have passed since the previous dose. Doses of PPSV23 are not needed for people immunized with PPSV23 at or after age 65 years.  Meningococcal vaccine. Adults with asplenia or persistent complement component deficiencies should receive 2 doses of quadrivalent meningococcal conjugate (MenACWY-D) vaccine. The doses should be obtained at least 2 months apart. Microbiologists working with certain meningococcal bacteria,  military recruits, people at risk during an outbreak, and people who travel to or live in countries with a high rate of meningitis should be immunized. A first-year college student up through age 21 years who is living in a residence hall should receive a dose if he did not receive a dose on or after his 16th birthday. Adults who have certain high-risk conditions should receive one or more doses of vaccine.  Hepatitis A vaccine. Adults who wish to be protected from this disease, have certain high-risk conditions, work with hepatitis A-infected animals, work in hepatitis A research labs, or   travel to or work in countries with a high rate of hepatitis A should be immunized. Adults who were previously unvaccinated and who anticipate close contact with an international adoptee during the first 60 days after arrival in the [place] from a country with a high rate of hepatitis A should be immunized.  Hepatitis B vaccine. Adults should be immunized if they wish to be protected from this disease, have certain high-risk conditions, may be exposed to blood or other infectious body fluids, are household contacts or sex partners of hepatitis B positive people, are clients or workers in certain care facilities, or travel to or work in countries with a high rate of hepatitis B.  Haemophilus influenzae type b (Hib) vaccine. A previously unvaccinated person with asplenia or sickle cell disease or having a scheduled splenectomy should receive 1 dose of Hib vaccine. Regardless of previous immunization, a recipient of a hematopoietic stem cell transplant should receive a 3-dose series 6-12 months after his successful transplant. Hib vaccine is not recommended for adults with HIV infection. Preventive Service / Frequency Ages 52 to 17  Blood pressure check.** / Every 1 to 2 years.  Lipid and cholesterol check.** / Every 5 years beginning at age 69.  Hepatitis C blood test.** / For any individual with known risks for  hepatitis C.  Skin self-exam. / Monthly.  Influenza vaccine. / Every year.  Tetanus, diphtheria, and acellular pertussis (Tdap, Td) vaccine.** / Consult your health care provider. 1 dose of Td every 10 years.  Varicella vaccine.** / Consult your health care provider.  HPV vaccine. / 3 doses over 6 months, if 72 or younger.  Measles, mumps, rubella (MMR) vaccine.** / You need at least 1 dose of MMR if you were born in 1957 or later. You may also need a second dose.  Pneumococcal 13-valent conjugate (PCV13) vaccine.** / Consult your health care provider.  Pneumococcal polysaccharide (PPSV23) vaccine.** / 1 to 2 doses if you smoke cigarettes or if you have certain conditions.  Meningococcal vaccine.** / 1 dose if you are age 35 to 60 years and a Market researcher living in a residence hall, or have one of several medical conditions. You may also need additional booster doses.  Hepatitis A vaccine.** / Consult your health care provider.  Hepatitis B vaccine.** / Consult your health care provider.  Haemophilus influenzae type b (Hib) vaccine.** / Consult your health care provider. Ages 35 to 8  Blood pressure check.** / Every 1 to 2 years.  Lipid and cholesterol check.** / Every 5 years beginning at age 57.  Lung cancer screening. / Every year if you are aged 44-80 years and have a 30-pack-year history of smoking and currently smoke or have quit within the past 15 years. Yearly screening is stopped once you have quit smoking for at least 15 years or develop a health problem that would prevent you from having lung cancer treatment.  Fecal occult blood test (FOBT) of stool. / Every year beginning at age 55 and continuing until age 73. You may not have to do this test if you get a colonoscopy every 10 years.  Flexible sigmoidoscopy** or colonoscopy.** / Every 5 years for a flexible sigmoidoscopy or every 10 years for a colonoscopy beginning at age 28 and continuing until age  1.  Hepatitis C blood test.** / For all people born from [birth year] through 1965 and any individual with known risks for hepatitis C.  Skin self-exam. / Monthly.  Influenza vaccine. / Every  year.  Tetanus, diphtheria, and acellular pertussis (Tdap/Td) vaccine.** / Consult your health care provider. 1 dose of Td every 10 years.  Varicella vaccine.** / Consult your health care provider.  Zoster vaccine.** / 1 dose for adults aged 53 years or older.  Measles, mumps, rubella (MMR) vaccine.** / You need at least 1 dose of MMR if you were born in 1957 or later. You may also need a second dose.  Pneumococcal 13-valent conjugate (PCV13) vaccine.** / Consult your health care provider.  Pneumococcal polysaccharide (PPSV23) vaccine.** / 1 to 2 doses if you smoke cigarettes or if you have certain conditions.  Meningococcal vaccine.** / Consult your health care provider.  Hepatitis A vaccine.** / Consult your health care provider.  Hepatitis B vaccine.** / Consult your health care provider.  Haemophilus influenzae type b (Hib) vaccine.** / Consult your health care provider. Ages 77 and over  Blood pressure check.** / Every 1 to 2 years.  Lipid and cholesterol check.**/ Every 5 years beginning at age 85.  Lung cancer screening. / Every year if you are aged 55-80 years and have a 30-pack-year history of smoking and currently smoke or have quit within the past 15 years. Yearly screening is stopped once you have quit smoking for at least 15 years or develop a health problem that would prevent you from having lung cancer treatment.  Fecal occult blood test (FOBT) of stool. / Every year beginning at age 33 and continuing until age 11. You may not have to do this test if you get a colonoscopy every 10 years.  Flexible sigmoidoscopy** or colonoscopy.** / Every 5 years for a flexible sigmoidoscopy or every 10 years for a colonoscopy beginning at age 28 and continuing until age 73.  Hepatitis C blood  test.** / For all people born from [birth year] through 1965 and any individual with known risks for hepatitis C.  Abdominal aortic aneurysm (AAA) screening.** / A one-time screening for ages 50 to 27 years who are current or former smokers.  Skin self-exam. / Monthly.  Influenza vaccine. / Every year.  Tetanus, diphtheria, and acellular pertussis (Tdap/Td) vaccine.** / 1 dose of Td every 10 years.  Varicella vaccine.** / Consult your health care provider.  Zoster vaccine.** / 1 dose for adults aged 34 years or older.  Pneumococcal 13-valent conjugate (PCV13) vaccine.** / Consult your health care provider.  Pneumococcal polysaccharide (PPSV23) vaccine.** / 1 dose for all adults aged 63 years and older.  Meningococcal vaccine.** / Consult your health care provider.  Hepatitis A vaccine.** / Consult your health care provider.  Hepatitis B vaccine.** / Consult your health care provider.  Haemophilus influenzae type b (Hib) vaccine.** / Consult your health care provider. **Family history and personal history of risk and conditions may change your health care provider's recommendations. Document Released: 08/06/2001 Document Revised: 06/15/2013 Document Reviewed: 11/05/2010 [facility] Patient Information 2015 [place], [place]. This information is not intended to replace advice given to you by your health care provider. Make sure you discuss any questions you have with your health care provider.

## 2014-07-26 NOTE — Addendum Note (Signed)
Addended by: Merrilyn PumaSIMMONS, Nevelyn Mellott N on: 07/26/2014 11:26 AM   Modules accepted: Orders

## 2014-07-26 NOTE — Progress Notes (Signed)
Pre visit review using our clinic review tool, if applicable. No additional management support is needed unless otherwise documented below in the visit note.   Subjective:     HPI  The patient is here for a wellness exam. The patient has been doing well overall without major physical or psychological issues going on lately. Dr Brunilda Payor checks UA, PSA.   F/u parasomnia - better on klonopin  F/u BPH sx's he saw a urologist  The patient presents for a follow-up of  chronic hypertension, elev glucose, hypokalemia controlled with medicines and diet.  Dr Kerry [facility]) saw the pt for his R eye blinking x 12 mo; his R eyelid is drooping... - better after Botox inj  Review of Systems  Constitutional: Negative for appetite change, fatigue and unexpected weight change.  HENT: Negative for congestion, nosebleeds, sneezing, sore throat and trouble swallowing.   Eyes: Negative for itching and visual disturbance.  Respiratory: Negative for cough.   Cardiovascular: Negative for chest pain, palpitations and leg swelling.  Gastrointestinal: Negative for diarrhea, blood in stool and abdominal distention.  Genitourinary: Negative for frequency and hematuria.  Musculoskeletal: Negative for back pain, gait problem, joint swelling and neck pain.  Skin: Negative for rash.  Neurological: Negative for dizziness, tremors, speech difficulty and weakness.  Psychiatric/Behavioral: Negative for sleep disturbance, dysphoric mood and agitation. The patient is not nervous/anxious.    Wt Readings from Last 3 Encounters:  07/26/14 212 lb (96.163 kg)  04/20/14 215 lb (97.523 kg)  01/10/14 212 lb 1.9 oz (96.217 kg)   BP Readings from Last 3 Encounters:  07/26/14 146/74  04/24/14 168/88  01/10/14 140/80        Objective:   Physical Exam  Constitutional: He is oriented to person, place, and time. He appears well-developed.  HENT:  Mouth/Throat: Oropharynx is clear and moist.  Eyes: Conjunctivae are normal.  Pupils are equal, round, and reactive to light. Right eye exhibits no discharge. Left eye exhibits no discharge.  B ptosis  Neck: Normal range of motion. No JVD present. No thyromegaly present.  Cardiovascular: Normal rate, regular rhythm, normal heart sounds and intact distal pulses.  Exam reveals no gallop and no friction rub.   No murmur heard. Pulmonary/Chest: Effort normal and breath sounds normal. No respiratory distress. He has no wheezes. He has no rales. He exhibits no tenderness.  Abdominal: Soft. Bowel sounds are normal. He exhibits no distension and no mass. There is no tenderness. There is no rebound and no guarding.  Musculoskeletal: Normal range of motion. He exhibits no edema and no tenderness.  Lymphadenopathy:    He has no cervical adenopathy.  Neurological: He is alert and oriented to person, place, and time. He has normal reflexes.  He exhibits normal muscle tone. Coordination normal.  Skin: Skin is warm and dry. No rash noted.  Psychiatric: He has a normal mood and affect. His behavior is normal. Judgment and thought content normal.  Rectal - checked by Dr Brunilda Payor 3 wks ago  Lab Results  Component Value Date   WBC 7.9 04/20/2014   HGB 13.8 04/20/2014   HCT 38.2* 04/20/2014   PLT 170 04/20/2014   GLUCOSE 129* 04/20/2014   CHOL 194 09/05/2010   TRIG 174.0* 09/05/2010   HDL 31.10* 09/05/2010   LDLDIRECT 115.9 09/08/2009   LDLCALC 128* 09/05/2010   ALT 25 07/14/2013   AST 21 07/14/2013   NA 135* 04/20/2014   K 3.9 04/20/2014   CL 97 04/20/2014   CREATININE 0.99 04/20/2014  BUN 13 04/20/2014   CO2 27 04/20/2014   TSH 2.40 07/14/2013   PSA 0.26 07/14/2013   HGBA1C 5.9 01/10/2014          Assessment & Plan:

## 2014-07-26 NOTE — Assessment & Plan Note (Signed)

## 2014-07-27 ENCOUNTER — Other Ambulatory Visit (INDEPENDENT_AMBULATORY_CARE_PROVIDER_SITE_OTHER): Payer: Medicare Other

## 2014-07-27 DIAGNOSIS — E785 Hyperlipidemia, unspecified: Secondary | ICD-10-CM

## 2014-07-27 DIAGNOSIS — Z0000 Encounter for general adult medical examination without abnormal findings: Secondary | ICD-10-CM | POA: Diagnosis not present

## 2014-07-27 LAB — BASIC METABOLIC PANEL
BUN: 9 mg/dL (ref 6–23)
CHLORIDE: 103 meq/L (ref 96–112)
CO2: 33 meq/L — AB (ref 19–32)
CREATININE: 0.98 mg/dL (ref 0.40–1.50)
Calcium: 9.2 mg/dL (ref 8.4–10.5)
GFR: 95.98 mL/min (ref >60.00)
GLUCOSE: 107 mg/dL — AB (ref 70–99)
POTASSIUM: 3.9 meq/L (ref 3.5–5.1)
SODIUM: 139 meq/L (ref 135–145)

## 2014-07-27 LAB — LIPID PANEL
CHOLESTEROL: 209 mg/dL — AB (ref 0–200)
HDL: 34.7 mg/dL — ABNORMAL LOW (ref >39.00)
LDL Cholesterol: 143 mg/dL — ABNORMAL HIGH (ref 0–99)
NONHDL: 174.3
Total CHOL/HDL Ratio: 6
Triglycerides: 157 mg/dL — ABNORMAL HIGH (ref 0.0–149.0)
VLDL: 31.4 mg/dL (ref 0.0–40.0)

## 2014-07-27 LAB — CBC WITH DIFFERENTIAL/PLATELET
Basophils Absolute: 0 10*3/uL (ref 0.0–0.1)
Basophils Relative: 0.9 % (ref 0.0–3.0)
EOS PCT: 2.6 % (ref 0.0–5.0)
Eosinophils Absolute: 0.1 10*3/uL (ref 0.0–0.7)
HEMATOCRIT: 44.6 % (ref 39.0–52.0)
Hemoglobin: 15.5 g/dL (ref 13.0–17.0)
Lymphocytes Relative: 44.8 % (ref 12.0–46.0)
Lymphs Abs: 1.5 10*3/uL (ref 0.7–4.0)
MCHC: 34.8 g/dL (ref 30.0–36.0)
MCV: 85.5 fl (ref 78.0–100.0)
Monocytes Absolute: 0.4 10*3/uL (ref 0.1–1.0)
Monocytes Relative: 10.3 % (ref 3.0–12.0)
NEUTROS ABS: 1.4 10*3/uL (ref 1.4–7.7)
Neutrophils Relative %: 41.4 % — ABNORMAL LOW (ref 43.0–77.0)
PLATELETS: 202 10*3/uL (ref 150.0–400.0)
RBC: 5.22 Mil/uL (ref 4.22–5.81)
RDW: 13.5 % (ref 11.5–15.5)
WBC: 3.4 10*3/uL — ABNORMAL LOW (ref 4.0–10.5)

## 2014-07-27 LAB — HEPATIC FUNCTION PANEL
ALT: 14 U/L (ref 0–53)
AST: 13 U/L (ref 0–37)
Albumin: 4 g/dL (ref 3.5–5.2)
Alkaline Phosphatase: 111 U/L (ref 39–117)
BILIRUBIN DIRECT: 0.2 mg/dL (ref 0.0–0.3)
Total Bilirubin: 1.3 mg/dL — ABNORMAL HIGH (ref 0.2–1.2)
Total Protein: 6.3 g/dL (ref 6.0–8.3)

## 2014-07-27 LAB — TSH: TSH: 2.54 u[IU]/mL (ref 0.35–4.50)

## 2014-08-16 ENCOUNTER — Encounter: Payer: Self-pay | Admitting: Gastroenterology

## 2014-09-08 ENCOUNTER — Telehealth: Payer: Self-pay

## 2014-09-08 NOTE — Telephone Encounter (Signed)
Left voice mail for pt to call back if he still wants flu vaccine 

## 2015-01-09 DIAGNOSIS — R351 Nocturia: Secondary | ICD-10-CM | POA: Diagnosis not present

## 2015-01-09 DIAGNOSIS — N401 Enlarged prostate with lower urinary tract symptoms: Secondary | ICD-10-CM | POA: Diagnosis not present

## 2015-01-09 DIAGNOSIS — N138 Other obstructive and reflux uropathy: Secondary | ICD-10-CM | POA: Diagnosis not present

## 2015-01-18 ENCOUNTER — Other Ambulatory Visit: Payer: Self-pay | Admitting: Internal Medicine

## 2015-01-23 ENCOUNTER — Ambulatory Visit: Payer: Medicare Other | Admitting: Internal Medicine

## 2015-02-20 ENCOUNTER — Other Ambulatory Visit (INDEPENDENT_AMBULATORY_CARE_PROVIDER_SITE_OTHER): Payer: Medicare Other

## 2015-02-20 ENCOUNTER — Ambulatory Visit (INDEPENDENT_AMBULATORY_CARE_PROVIDER_SITE_OTHER): Payer: Medicare Other | Admitting: Internal Medicine

## 2015-02-20 ENCOUNTER — Encounter: Payer: Self-pay | Admitting: Internal Medicine

## 2015-02-20 VITALS — BP 139/80 | HR 62 | Wt 209.0 lb

## 2015-02-20 DIAGNOSIS — R253 Fasciculation: Secondary | ICD-10-CM

## 2015-02-20 DIAGNOSIS — H02401 Unspecified ptosis of right eyelid: Secondary | ICD-10-CM

## 2015-02-20 DIAGNOSIS — E876 Hypokalemia: Secondary | ICD-10-CM | POA: Diagnosis not present

## 2015-02-20 DIAGNOSIS — I10 Essential (primary) hypertension: Secondary | ICD-10-CM | POA: Diagnosis not present

## 2015-02-20 DIAGNOSIS — R739 Hyperglycemia, unspecified: Secondary | ICD-10-CM

## 2015-02-20 LAB — BASIC METABOLIC PANEL
BUN: 14 mg/dL (ref 6–23)
CALCIUM: 9 mg/dL (ref 8.4–10.5)
CO2: 27 mEq/L (ref 19–32)
Chloride: 103 mEq/L (ref 96–112)
Creatinine, Ser: 1.16 mg/dL (ref 0.40–1.50)
GFR: 78.89 mL/min (ref >60.00)
GLUCOSE: 87 mg/dL (ref 70–99)
POTASSIUM: 3.9 meq/L (ref 3.5–5.1)
Sodium: 139 mEq/L (ref 135–145)

## 2015-02-20 LAB — HEMOGLOBIN A1C: Hgb A1c MFr Bld: 5.8 % (ref 4.6–6.5)

## 2015-02-20 MED ORDER — AMLODIPINE BESY-BENAZEPRIL HCL 10-20 MG PO CAPS: 1.0000 | ORAL_CAPSULE | Freq: Every day | ORAL | Status: DC

## 2015-02-20 NOTE — Assessment & Plan Note (Signed)
F/u w/Dr Hyacinth Meeker

## 2015-02-20 NOTE — Assessment & Plan Note (Signed)
On KCl Labs 

## 2015-02-20 NOTE — Progress Notes (Signed)
Subjective:  Patient ID: [name], male    DOB: 27-Dec-1940  Age: 74 y.o. MRN: [id number]  CC: No chief complaint on file.   HPI [name] presents for HTN, anxiety, OA, eyelids problems f/u  Outpatient Prescriptions Prior to Visit  Medication Sig Dispense Refill  . amLODipine-benazepril (LOTREL) 10-20 MG per capsule Take 1 capsule by mouth daily. 30 capsule 11  . Cholecalciferol (VITAMIN D) 1000 UNITS capsule Take 1 capsule (1,000 Units total) by mouth daily. 100 capsule 3  . potassium chloride (KLOR-CON) 8 MEQ tablet Take 1 tablet (8 MEQ total) by mouth daily. 30 tablet 11  . KLOR-CON 8 MEQ tablet TAKE 1 TABLET (8 MEQ TOTAL) BY MOUTH DAILY. 30 tablet 11  . aspirin 81 MG tablet Take 81 mg by mouth daily.      . clonazePAM (KLONOPIN) 0.5 MG tablet Take 0.5 mg by mouth at bedtime as needed for anxiety (sleep).    . naproxen sodium (ANAPROX) 220 MG tablet Take 220 mg by mouth 2 (two) times daily as needed (pain).    . clindamycin (CLEOCIN) 300 MG capsule Take 1 capsule (300 mg total) by mouth 3 (three) times daily. (Patient not taking: Reported on 02/20/2015) 42 capsule 0   No facility-administered medications prior to visit.    ROS Review of Systems  Constitutional: Negative for appetite change, fatigue and unexpected weight change.  HENT: Negative for congestion, nosebleeds, sneezing, sore throat and trouble swallowing.   Eyes: Negative for itching and visual disturbance.  Respiratory: Negative for cough.   Cardiovascular: Negative for chest pain, palpitations and leg swelling.  Gastrointestinal: Negative for nausea, diarrhea, blood in stool and abdominal distention.  Genitourinary: Negative for frequency and hematuria.  Musculoskeletal: Negative for back pain, joint swelling, arthralgias, gait problem and neck pain.  Skin: Negative for rash.  Neurological: Negative for dizziness, tremors, speech difficulty and weakness.  Psychiatric/Behavioral: Positive for sleep  disturbance. Negative for dysphoric mood and agitation. The patient is not nervous/anxious.     Objective:  BP 140/80 mmHg  Pulse 62  Wt 209 lb (94.802 kg)  SpO2 95%  BP Readings from Last 3 Encounters:  02/20/15 140/80  07/26/14 146/74  04/24/14 168/88    Wt Readings from Last 3 Encounters:  02/20/15 209 lb (94.802 kg)  07/26/14 212 lb (96.163 kg)  04/20/14 215 lb (97.523 kg)    Physical Exam  Constitutional: He is oriented to person, place, and time. He appears well-developed. No distress.  NAD  HENT:  Mouth/Throat: Oropharynx is clear and moist.  Eyes: Conjunctivae are normal. Pupils are equal, round, and reactive to light.  Neck: Normal range of motion. No JVD present. No thyromegaly present.  Cardiovascular: Normal rate, regular rhythm, normal heart sounds and intact distal pulses.  Exam reveals no gallop and no friction rub.   No murmur heard. Pulmonary/Chest: Effort normal and breath sounds normal. No respiratory distress. He has no wheezes. He has no rales. He exhibits no tenderness.  Abdominal: Soft. Bowel sounds are normal. He exhibits no distension and no mass. There is no tenderness. There is no rebound and no guarding.  Musculoskeletal: Normal range of motion. He exhibits no edema or tenderness.  Lymphadenopathy:    He has no cervical adenopathy.  Neurological: He is alert and oriented to person, place, and time. He has normal reflexes. No cranial nerve deficit. He exhibits normal muscle tone. He displays a negative Romberg sign. Coordination and gait normal.  Skin: Skin is warm and  dry. No rash noted.  Psychiatric: He has a normal mood and affect. His behavior is normal. Judgment and thought content normal.    Lab Results  Component Value Date   WBC 3.4* 07/27/2014   HGB 15.5 07/27/2014   HCT 44.6 07/27/2014   PLT 202.0 07/27/2014   GLUCOSE 107* 07/27/2014   CHOL 209* 07/27/2014   TRIG 157.0* 07/27/2014   HDL 34.70* 07/27/2014   LDLDIRECT 115.9  09/08/2009   LDLCALC 143* 07/27/2014   ALT 14 07/27/2014   AST 13 07/27/2014   NA 139 07/27/2014   K 3.9 07/27/2014   CL 103 07/27/2014   CREATININE 0.98 07/27/2014   BUN 9 07/27/2014   CO2 33* 07/27/2014   TSH 2.54 07/27/2014   PSA 0.26 07/14/2013   HGBA1C 5.9 01/10/2014    Dg Hand Complete Right  04/20/2014   CLINICAL DATA:  Recent right index finger puncture wound on radial side status post dog bite. Now hand is swollen, inflamed, red, and painful.  EXAM: RIGHT HAND - COMPLETE 3+ VIEW  COMPARISON:  None.  FINDINGS: Soft tissue swelling of the hand, most pronounced along the second digit. No radiopaque foreign body. Tiny amount of radial soft tissue gas at the level of the second PIP is questioned. Mild diffuse osteopenia. First interphalangeal, second PIP and second through fourth DIP mild degenerative changes.  IMPRESSION: Soft tissue swelling of the hand and second digit. There may be a tiny amount of soft tissue gas at the reported puncture site. No radiopaque foreign body.  Osteopenia and mild degenerative changes as above. No acute osseous finding of the right hand.   Electronically Signed   By: Jearld Lesch M.D.   On: 04/20/2014 00:15    Assessment & Plan:   There are no diagnoses linked to this encounter. I have discontinued Mr. Levingston clindamycin and KLOR-CON. I am also having him maintain his aspirin, Vitamin D, potassium chloride, naproxen sodium, clonazePAM, amLODipine-benazepril, and tamsulosin.  Meds ordered this encounter  Medications  . tamsulosin (FLOMAX) 0.4 MG CAPS capsule    Sig: TAKE 1 CAPSULE BEDTIME    Refill:  5     Follow-up: No Follow-up on file.  Sonda Primes, MD

## 2015-02-20 NOTE — Assessment & Plan Note (Signed)
Lotrel Labs 

## 2015-02-20 NOTE — Assessment & Plan Note (Signed)
F/u w/Dr Miller 

## 2015-02-20 NOTE — Progress Notes (Signed)
Pre visit review using our clinic review tool, if applicable. No additional management support is needed unless otherwise documented below in the visit note. 

## 2015-02-20 NOTE — Assessment & Plan Note (Signed)
A1c

## 2015-08-22 ENCOUNTER — Ambulatory Visit (INDEPENDENT_AMBULATORY_CARE_PROVIDER_SITE_OTHER): Payer: Medicare Other | Admitting: Internal Medicine

## 2015-08-22 ENCOUNTER — Encounter: Payer: Self-pay | Admitting: Internal Medicine

## 2015-08-22 VITALS — BP 112/70 | HR 70 | Wt 213.0 lb

## 2015-08-22 DIAGNOSIS — H6120 Impacted cerumen, unspecified ear: Secondary | ICD-10-CM | POA: Insufficient documentation

## 2015-08-22 DIAGNOSIS — H6123 Impacted cerumen, bilateral: Secondary | ICD-10-CM | POA: Diagnosis not present

## 2015-08-22 DIAGNOSIS — I10 Essential (primary) hypertension: Secondary | ICD-10-CM | POA: Diagnosis not present

## 2015-08-22 DIAGNOSIS — E785 Hyperlipidemia, unspecified: Secondary | ICD-10-CM

## 2015-08-22 DIAGNOSIS — G4750 Parasomnia, unspecified: Secondary | ICD-10-CM

## 2015-08-22 DIAGNOSIS — R253 Fasciculation: Secondary | ICD-10-CM

## 2015-08-22 MED ORDER — CLONAZEPAM 0.5 MG PO TABS: 0.5000 mg | ORAL_TABLET | Freq: Every evening | ORAL | Status: DC | PRN

## 2015-08-22 MED ORDER — POTASSIUM CHLORIDE ER 8 MEQ PO TBCR: EXTENDED_RELEASE_TABLET | ORAL | Status: DC

## 2015-08-22 NOTE — Assessment & Plan Note (Signed)
Lotrel

## 2015-08-22 NOTE — Assessment & Plan Note (Signed)
Clonazepam 

## 2015-08-22 NOTE — Progress Notes (Signed)
Pre visit review using our clinic review tool, if applicable. No additional management support is needed unless otherwise documented below in the visit note. 

## 2015-08-22 NOTE — Progress Notes (Signed)
Subjective:  Patient ID: [name], male    DOB: 09/17/1940  Age: 75 y.o. MRN: [id number]  CC: No chief complaint on file.   HPI MYKAL BATIZ presents for HTN, sleep disorder, BPH f/u. C/o L ear congestion  Outpatient Prescriptions Prior to Visit  Medication Sig Dispense Refill  . amLODipine-benazepril (LOTREL) 10-20 MG per capsule Take 1 capsule by mouth daily. 90 capsule 3  . Cholecalciferol (VITAMIN D) 1000 UNITS capsule Take 1 capsule (1,000 Units total) by mouth daily. 100 capsule 3  . potassium chloride (KLOR-CON) 8 MEQ tablet Take 1 tablet (8 MEQ total) by mouth daily. 30 tablet 11  . aspirin 81 MG tablet Take 81 mg by mouth daily. Reported on 08/22/2015    . naproxen sodium (ANAPROX) 220 MG tablet Take 220 mg by mouth 2 (two) times daily as needed (pain). Reported on 08/22/2015    . tamsulosin (FLOMAX) 0.4 MG CAPS capsule Reported on 08/22/2015  5  . clonazePAM (KLONOPIN) 0.5 MG tablet Take 0.5 mg by mouth at bedtime as needed for anxiety (sleep). Reported on 08/22/2015     No facility-administered medications prior to visit.    ROS Review of Systems  Constitutional: Negative for appetite change, fatigue and unexpected weight change.  HENT: Positive for hearing loss. Negative for congestion, ear pain, nosebleeds, sneezing, sore throat and trouble swallowing.   Eyes: Negative for itching and visual disturbance.  Respiratory: Negative for cough.   Cardiovascular: Negative for chest pain, palpitations and leg swelling.  Gastrointestinal: Negative for nausea, diarrhea, blood in stool and abdominal distention.  Genitourinary: Negative for frequency and hematuria.  Musculoskeletal: Negative for back pain, joint swelling, gait problem and neck pain.  Skin: Negative for rash.  Neurological: Negative for dizziness, tremors, speech difficulty and weakness.  Psychiatric/Behavioral: Positive for sleep disturbance. Negative for dysphoric mood and agitation. The patient is not  nervous/anxious.     Objective:  BP 112/70 mmHg  Pulse 70  Wt 213 lb (96.616 kg)  SpO2 95%  BP Readings from Last 3 Encounters:  08/22/15 112/70  02/20/15 139/80  07/26/14 146/74    Wt Readings from Last 3 Encounters:  08/22/15 213 lb (96.616 kg)  02/20/15 209 lb (94.802 kg)  07/26/14 212 lb (96.163 kg)    Physical Exam  Constitutional: He is oriented to person, place, and time. He appears well-developed. No distress.  NAD  HENT:  Mouth/Throat: Oropharynx is clear and moist.  Eyes: Conjunctivae are normal. Pupils are equal, round, and reactive to light.  Neck: Normal range of motion. No JVD present. No thyromegaly present.  Cardiovascular: Normal rate, regular rhythm, normal heart sounds and intact distal pulses.  Exam reveals no gallop and no friction rub.   No murmur heard. Pulmonary/Chest: Effort normal and breath sounds normal. No respiratory distress. He has no wheezes. He has no rales. He exhibits no tenderness.  Abdominal: Soft. Bowel sounds are normal. He exhibits no distension and no mass. There is no tenderness. There is no rebound and no guarding.  Musculoskeletal: Normal range of motion. He exhibits no edema or tenderness.  Lymphadenopathy:    He has no cervical adenopathy.  Neurological: He is alert and oriented to person, place, and time. He has normal reflexes. No cranial nerve deficit. He exhibits normal muscle tone. He displays a negative Romberg sign. Coordination and gait normal.  Skin: Skin is warm and dry. No rash noted.  Psychiatric: He has a normal mood and affect. His behavior is normal. Judgment  and thought content normal.  B wax    Procedure Note :     Procedure :  Ear irrigation   Indication:  Cerumen impaction B   Risks, including pain, dizziness, eardrum perforation, bleeding, infection and others as well as benefits were explained to the patient in detail. Verbal consent was obtained and the patient agreed to proceed.    We used "The  Elephant Ear Irrigation Device" filled with lukewarm water for irrigation. A large amount wax was recovered. Procedure has also required manual wax removal with an ear loop.   Tolerated well. Complications: None.   Postprocedure instructions :  Call if problems.    Lab Results  Component Value Date   WBC 3.4* 07/27/2014   HGB 15.5 07/27/2014   HCT 44.6 07/27/2014   PLT 202.0 07/27/2014   GLUCOSE 87 02/20/2015   CHOL 209* 07/27/2014   TRIG 157.0* 07/27/2014   HDL 34.70* 07/27/2014   LDLDIRECT 115.9 09/08/2009   LDLCALC 143* 07/27/2014   ALT 14 07/27/2014   AST 13 07/27/2014   NA 139 02/20/2015   K 3.9 02/20/2015   CL 103 02/20/2015   CREATININE 1.16 02/20/2015   BUN 14 02/20/2015   CO2 27 02/20/2015   TSH 2.54 07/27/2014   PSA 0.26 07/14/2013   HGBA1C 5.8 02/20/2015    Dg Hand Complete Right  04/20/2014  CLINICAL DATA:  Recent right index finger puncture wound on radial side status post dog bite. Now hand is swollen, inflamed, red, and painful. EXAM: RIGHT HAND - COMPLETE 3+ VIEW COMPARISON:  None. FINDINGS: Soft tissue swelling of the hand, most pronounced along the second digit. No radiopaque foreign body. Tiny amount of radial soft tissue gas at the level of the second PIP is questioned. Mild diffuse osteopenia. First interphalangeal, second PIP and second through fourth DIP mild degenerative changes. IMPRESSION: Soft tissue swelling of the hand and second digit. There may be a tiny amount of soft tissue gas at the reported puncture site. No radiopaque foreign body. Osteopenia and mild degenerative changes as above. No acute osseous finding of the right hand. Electronically Signed   By: Jearld Lesch M.D.   On: 04/20/2014 00:15    Assessment & Plan:   Diagnoses and all orders for this visit:  Essential hypertension  Cerumen impaction, bilateral  Hyperlipidemia  Non-REM Parasomnia  Twitching  Other orders -     clonazePAM (KLONOPIN) 0.5 MG tablet; Take 1  tablet (0.5 mg total) by mouth at bedtime as needed for anxiety (sleep). Reported on 08/22/2015 -     potassium chloride (KLOR-CON) 8 MEQ tablet; Take 1 tablet (8 MEQ total) by mouth daily.   I have changed Mr. Diltz clonazePAM. I am also having him maintain his aspirin, Vitamin D, naproxen sodium, tamsulosin, amLODipine-benazepril, and potassium chloride.  Meds ordered this encounter  Medications  . clonazePAM (KLONOPIN) 0.5 MG tablet    Sig: Take 1 tablet (0.5 mg total) by mouth at bedtime as needed for anxiety (sleep). Reported on 08/22/2015    Dispense:  90 tablet    Refill:  1  . potassium chloride (KLOR-CON) 8 MEQ tablet    Sig: Take 1 tablet (8 MEQ total) by mouth daily.    Dispense:  30 tablet    Refill:  11     Follow-up: Return in about 4 months (around 12/20/2015) for Wellness Exam.  Sonda Primes, MD

## 2015-08-22 NOTE — Assessment & Plan Note (Signed)
Will irrigate 

## 2015-08-22 NOTE — Assessment & Plan Note (Signed)
Declined statins. 

## 2015-12-20 ENCOUNTER — Ambulatory Visit (INDEPENDENT_AMBULATORY_CARE_PROVIDER_SITE_OTHER): Payer: 59 | Admitting: Internal Medicine

## 2015-12-20 ENCOUNTER — Encounter: Payer: Self-pay | Admitting: Internal Medicine

## 2015-12-20 ENCOUNTER — Other Ambulatory Visit (INDEPENDENT_AMBULATORY_CARE_PROVIDER_SITE_OTHER): Payer: Medicare Other

## 2015-12-20 VITALS — BP 130/72 | HR 60 | Ht 67.0 in | Wt 206.0 lb

## 2015-12-20 DIAGNOSIS — I10 Essential (primary) hypertension: Secondary | ICD-10-CM

## 2015-12-20 DIAGNOSIS — Z0000 Encounter for general adult medical examination without abnormal findings: Secondary | ICD-10-CM | POA: Diagnosis not present

## 2015-12-20 DIAGNOSIS — R253 Fasciculation: Secondary | ICD-10-CM | POA: Diagnosis not present

## 2015-12-20 DIAGNOSIS — Z23 Encounter for immunization: Secondary | ICD-10-CM | POA: Diagnosis not present

## 2015-12-20 DIAGNOSIS — H02401 Unspecified ptosis of right eyelid: Secondary | ICD-10-CM | POA: Diagnosis not present

## 2015-12-20 DIAGNOSIS — E876 Hypokalemia: Secondary | ICD-10-CM | POA: Diagnosis not present

## 2015-12-20 DIAGNOSIS — E785 Hyperlipidemia, unspecified: Secondary | ICD-10-CM

## 2015-12-20 DIAGNOSIS — R252 Cramp and spasm: Secondary | ICD-10-CM

## 2015-12-20 DIAGNOSIS — R739 Hyperglycemia, unspecified: Secondary | ICD-10-CM

## 2015-12-20 DIAGNOSIS — N320 Bladder-neck obstruction: Secondary | ICD-10-CM

## 2015-12-20 LAB — BASIC METABOLIC PANEL
BUN: 13 mg/dL (ref 6–23)
CHLORIDE: 103 meq/L (ref 96–112)
CO2: 29 mEq/L (ref 19–32)
CREATININE: 1.06 mg/dL (ref 0.40–1.50)
Calcium: 9.2 mg/dL (ref 8.4–10.5)
GFR: 87.34 mL/min (ref >60.00)
Glucose, Bld: 107 mg/dL — ABNORMAL HIGH (ref 70–99)
POTASSIUM: 4.1 meq/L (ref 3.5–5.1)
SODIUM: 139 meq/L (ref 135–145)

## 2015-12-20 LAB — HEPATIC FUNCTION PANEL
ALT: 12 U/L (ref 0–53)
AST: 14 U/L (ref 0–37)
Albumin: 4.2 g/dL (ref 3.5–5.2)
Alkaline Phosphatase: 102 U/L (ref 39–117)
BILIRUBIN DIRECT: 0.2 mg/dL (ref 0.0–0.3)
TOTAL PROTEIN: 6.6 g/dL (ref 6.0–8.3)
Total Bilirubin: 1.4 mg/dL — ABNORMAL HIGH (ref 0.2–1.2)

## 2015-12-20 LAB — CBC WITH DIFFERENTIAL/PLATELET
Basophils Absolute: 0 10*3/uL (ref 0.0–0.1)
Basophils Relative: 0.9 % (ref 0.0–3.0)
EOS ABS: 0.1 10*3/uL (ref 0.0–0.7)
EOS PCT: 2 % (ref 0.0–5.0)
HEMATOCRIT: 46.1 % (ref 39.0–52.0)
HEMOGLOBIN: 15.9 g/dL (ref 13.0–17.0)
LYMPHS PCT: 43.2 % (ref 12.0–46.0)
Lymphs Abs: 1.6 10*3/uL (ref 0.7–4.0)
MCHC: 34.6 g/dL (ref 30.0–36.0)
MCV: 86.7 fl (ref 78.0–100.0)
MONOS PCT: 13.4 % — AB (ref 3.0–12.0)
Monocytes Absolute: 0.5 10*3/uL (ref 0.1–1.0)
Neutro Abs: 1.5 10*3/uL (ref 1.4–7.7)
Neutrophils Relative %: 40.5 % — ABNORMAL LOW (ref 43.0–77.0)
Platelets: 207 10*3/uL (ref 150.0–400.0)
RBC: 5.32 Mil/uL (ref 4.22–5.81)
RDW: 13.1 % (ref 11.5–15.5)
WBC: 3.8 10*3/uL — AB (ref 4.0–10.5)

## 2015-12-20 LAB — LIPID PANEL
CHOLESTEROL: 210 mg/dL — AB (ref 0–200)
HDL: 36.6 mg/dL — ABNORMAL LOW (ref >39.00)
LDL CALC: 152 mg/dL — AB (ref 0–99)
NonHDL: 173.2
Total CHOL/HDL Ratio: 6
Triglycerides: 108 mg/dL (ref 0.0–149.0)
VLDL: 21.6 mg/dL (ref 0.0–40.0)

## 2015-12-20 LAB — TSH: TSH: 2.19 u[IU]/mL (ref 0.35–4.50)

## 2015-12-20 LAB — HEMOGLOBIN A1C: Hgb A1c MFr Bld: 5.7 % (ref 4.6–6.5)

## 2015-12-20 LAB — PSA: PSA: 0.27 ng/mL (ref 0.10–4.00)

## 2015-12-20 NOTE — Assessment & Plan Note (Signed)
Lotrel Labs BP Readings from Last 3 Encounters:  12/20/15 130/72  08/22/15 112/70  02/20/15 139/80

## 2015-12-20 NOTE — Assessment & Plan Note (Signed)
F/u w/Neurology 

## 2015-12-20 NOTE — Assessment & Plan Note (Signed)
Labs Drink 1 volume of Gatorade per 3 volumes of water when sweating outside KCl

## 2015-12-20 NOTE — Assessment & Plan Note (Signed)
Labs

## 2015-12-20 NOTE — Assessment & Plan Note (Signed)
Drink 1 volume of Gatorade per 3 volumes of water when sweating outside KCl po

## 2015-12-20 NOTE — Assessment & Plan Note (Signed)
Ophth appt for blepharoplasty - discussed

## 2015-12-20 NOTE — Progress Notes (Signed)
Subjective:  Patient ID: Larry BackWoody M Mccomb, male    DOB: 11/04/1940  Age: 75 y.o. MRN: [id number]  CC: No chief complaint on file.   HPI [name] presents for a well exam. C/o leg cramps, droopy eyelids. F/u low K, HTN  Outpatient Prescriptions Prior to Visit  Medication Sig Dispense Refill  . amLODipine-benazepril (LOTREL) 10-20 MG per capsule Take 1 capsule by mouth daily. 90 capsule 3  . Cholecalciferol (VITAMIN D) 1000 UNITS capsule Take 1 capsule (1,000 Units total) by mouth daily. 100 capsule 3  . clonazePAM (KLONOPIN) 0.5 MG tablet Take 1 tablet (0.5 mg total) by mouth at bedtime as needed for anxiety (sleep). Reported on 08/22/2015 90 tablet 1  . naproxen sodium (ANAPROX) 220 MG tablet Take 220 mg by mouth 2 (two) times daily as needed (pain). Reported on 08/22/2015    . potassium chloride (KLOR-CON) 8 MEQ tablet Take 1 tablet (8 MEQ total) by mouth daily. 30 tablet 11  . aspirin 81 MG tablet Take 81 mg by mouth daily. Reported on 12/20/2015    . tamsulosin (FLOMAX) 0.4 MG CAPS capsule Reported on 12/20/2015  5   No facility-administered medications prior to visit.    ROS Review of Systems  Constitutional: Negative for appetite change, fatigue and unexpected weight change.  HENT: Negative for congestion, nosebleeds, sneezing, sore throat and trouble swallowing.   Eyes: Negative for itching and visual disturbance.  Respiratory: Negative for cough.   Cardiovascular: Negative for chest pain, palpitations and leg swelling.  Gastrointestinal: Negative for nausea, diarrhea, blood in stool and abdominal distention.  Genitourinary: Negative for frequency and hematuria.  Musculoskeletal: Negative for back pain, joint swelling, gait problem and neck pain.  Skin: Negative for rash.  Neurological: Negative for dizziness, tremors, speech difficulty and weakness.  Psychiatric/Behavioral: Negative for sleep disturbance, dysphoric mood and agitation. The patient is not nervous/anxious.      Objective:  BP 130/72 mmHg  Pulse 60  Ht 5\' 7"  (1.702 m)  Wt 206 lb (93.441 kg)  BMI 32.26 kg/m2  SpO2 94%  BP Readings from Last 3 Encounters:  12/20/15 130/72  08/22/15 112/70  02/20/15 139/80    Wt Readings from Last 3 Encounters:  12/20/15 206 lb (93.441 kg)  08/22/15 213 lb (96.616 kg)  02/20/15 209 lb (94.802 kg)    Physical Exam  Constitutional: He is oriented to person, place, and time. He appears well-developed. No distress.  NAD  HENT:  Mouth/Throat: Oropharynx is clear and moist.  Eyes: Conjunctivae are normal. Pupils are equal, round, and reactive to light.  Neck: Normal range of motion. No JVD present. No thyromegaly present.  Cardiovascular: Normal rate, regular rhythm, normal heart sounds and intact distal pulses.  Exam reveals no gallop and no friction rub.   No murmur heard. Pulmonary/Chest: Effort normal and breath sounds normal. No respiratory distress. He has no wheezes. He has no rales. He exhibits no tenderness.  Abdominal: Soft. Bowel sounds are normal. He exhibits no distension and no mass. There is no tenderness. There is no rebound and no guarding.  Genitourinary: Rectum normal and prostate normal. Guaiac negative stool.  Musculoskeletal: Normal range of motion. He exhibits no edema or tenderness.  Lymphadenopathy:    He has no cervical adenopathy.  Neurological: He is alert and oriented to person, place, and time. He has normal reflexes. No cranial nerve deficit. He exhibits normal muscle tone. He displays a negative Romberg sign. Coordination and gait normal.  Skin: Skin is warm  and dry. No rash noted.  Psychiatric: He has a normal mood and affect. His behavior is normal. Judgment and thought content normal.  prostate 1+ Ptosis R>L  Lab Results  Component Value Date   WBC 3.4* 07/27/2014   HGB 15.5 07/27/2014   HCT 44.6 07/27/2014   PLT 202.0 07/27/2014   GLUCOSE 87 02/20/2015   CHOL 209* 07/27/2014   TRIG 157.0* 07/27/2014   HDL  34.70* 07/27/2014   LDLDIRECT 115.9 09/08/2009   LDLCALC 143* 07/27/2014   ALT 14 07/27/2014   AST 13 07/27/2014   NA 139 02/20/2015   K 3.9 02/20/2015   CL 103 02/20/2015   CREATININE 1.16 02/20/2015   BUN 14 02/20/2015   CO2 27 02/20/2015   TSH 2.54 07/27/2014   PSA 0.26 07/14/2013   HGBA1C 5.8 02/20/2015    Dg Hand Complete Right  04/20/2014  CLINICAL DATA:  Recent right index finger puncture wound on radial side status post dog bite. Now hand is swollen, inflamed, red, and painful. EXAM: RIGHT HAND - COMPLETE 3+ VIEW COMPARISON:  None. FINDINGS: Soft tissue swelling of the hand, most pronounced along the second digit. No radiopaque foreign body. Tiny amount of radial soft tissue gas at the level of the second PIP is questioned. Mild diffuse osteopenia. First interphalangeal, second PIP and second through fourth DIP mild degenerative changes. IMPRESSION: Soft tissue swelling of the hand and second digit. There may be a tiny amount of soft tissue gas at the reported puncture site. No radiopaque foreign body. Osteopenia and mild degenerative changes as above. No acute osseous finding of the right hand. Electronically Signed   By: Jearld LeschAndrew  DelGaizo M.D.   On: 04/20/2014 00:15    Assessment & Plan:   There are no diagnoses linked to this encounter. I am having Mr. Jimmey Ralpharker maintain his aspirin, Vitamin D, naproxen sodium, tamsulosin, amLODipine-benazepril, clonazePAM, and potassium chloride.  No orders of the defined types were placed in this encounter.     Follow-up: No Follow-up on file.  Sonda PrimesAlex Plotnikov, MD

## 2015-12-20 NOTE — Progress Notes (Signed)
Pre visit review using our clinic review tool, if applicable. No additional management support is needed unless otherwise documented below in the visit note. 

## 2015-12-20 NOTE — Addendum Note (Signed)
Addended by: Merrilyn PumaSIMMONS, STACEY N on: 12/20/2015 11:07 AM   Modules accepted: Orders

## 2015-12-20 NOTE — Patient Instructions (Signed)
Drink 1 volume of Gatorade per 3 volumes of water when sweating outside    Preventive Care for Adults, Male A healthy lifestyle and preventive care can promote health and wellness. Preventive health guidelines for men include the following key practices:  A routine yearly physical is a good way to check with your health care provider about your health and preventative screening. It is a chance to share any concerns and updates on your health and to receive a thorough exam.  Visit your dentist for a routine exam and preventative care every 6 months. Brush your teeth twice a day and floss once a day. Good oral hygiene prevents tooth decay and gum disease.  The frequency of eye exams is based on your age, health, family medical history, use of contact lenses, and other factors. Follow your health care provider's recommendations for frequency of eye exams.  Eat a healthy diet. Foods such as vegetables, fruits, whole grains, low-fat dairy products, and lean protein foods contain the nutrients you need without too many calories. Decrease your intake of foods high in solid fats, added sugars, and salt. Eat the right amount of calories for you.Get information about a proper diet from your health care provider, if necessary.  Regular physical exercise is one of the most important things you can do for your health. Most adults should get at least 150 minutes of moderate-intensity exercise (any activity that increases your heart rate and causes you to sweat) each week. In addition, most adults need muscle-strengthening exercises on 2 or more days a week.  Maintain a healthy weight. The body mass index (BMI) is a screening tool to identify possible weight problems. It provides an estimate of body fat based on height and weight. Your health care provider can find your BMI and can help you achieve or maintain a healthy weight.For adults 20 years and older:  A BMI below 18.5 is considered underweight.  A BMI  of 18.5 to 24.9 is normal.  A BMI of 25 to 29.9 is considered overweight.  A BMI of 30 and above is considered obese.  Maintain normal blood lipids and cholesterol levels by exercising and minimizing your intake of saturated fat. Eat a balanced diet with plenty of fruit and vegetables. Blood tests for lipids and cholesterol should begin at age 8 and be repeated every 5 years. If your lipid or cholesterol levels are high, you are over 50, or you are at high risk for heart disease, you may need your cholesterol levels checked more frequently.Ongoing high lipid and cholesterol levels should be treated with medicines if diet and exercise are not working.  If you smoke, find out from your health care provider how to quit. If you do not use tobacco, do not start.  Lung cancer screening is recommended for adults aged 18-80 years who are at high risk for developing lung cancer because of a history of smoking. A yearly low-dose CT scan of the lungs is recommended for people who have at least a 30-pack-year history of smoking and are a current smoker or have quit within the past 15 years. A pack year of smoking is smoking an average of 1 pack of cigarettes a day for 1 year (for example: 1 pack a day for 30 years or 2 packs a day for 15 years). Yearly screening should continue until the smoker has stopped smoking for at least 15 years. Yearly screening should be stopped for people who develop a health problem that would prevent them  from having lung cancer treatment.  If you choose to drink alcohol, do not have more than 2 drinks per day. One drink is considered to be 12 ounces (355 mL) of beer, 5 ounces (148 mL) of wine, or 1.5 ounces (44 mL) of liquor.  Avoid use of street drugs. Do not share needles with anyone. Ask for help if you need support or instructions about stopping the use of drugs.  High blood pressure causes heart disease and increases the risk of stroke. Your blood pressure should be checked  at least every 1-2 years. Ongoing high blood pressure should be treated with medicines, if weight loss and exercise are not effective.  If you are 71-76 years old, ask your health care provider if you should take aspirin to prevent heart disease.  Diabetes screening is done by taking a blood sample to check your blood glucose level after you have not eaten for a certain period of time (fasting). If you are not overweight and you do not have risk factors for diabetes, you should be screened once every 3 years starting at age 4. If you are overweight or obese and you are 39-20 years of age, you should be screened for diabetes every year as part of your cardiovascular risk assessment.  Colorectal cancer can be detected and often prevented. Most routine colorectal cancer screening begins at the age of 61 and continues through age 42. However, your health care provider may recommend screening at an earlier age if you have risk factors for colon cancer. On a yearly basis, your health care provider may provide home test kits to check for hidden blood in the stool. Use of a small camera at the end of a tube to directly examine the colon (sigmoidoscopy or colonoscopy) can detect the earliest forms of colorectal cancer. Talk to your health care provider about this at age 55, when routine screening begins. Direct exam of the colon should be repeated every 5-10 years through age 46, unless early forms of precancerous polyps or small growths are found.  People who are at an increased risk for hepatitis B should be screened for this virus. You are considered at high risk for hepatitis B if:  You were born in a country where hepatitis B occurs often. Talk with your health care provider about which countries are considered high risk.  Your parents were born in a high-risk country and you have not received a shot to protect against hepatitis B (hepatitis B vaccine).  You have HIV or AIDS.  You use needles to inject  street drugs.  You live with, or have sex with, someone who has hepatitis B.  You are a man who has sex with other men (MSM).  You get hemodialysis treatment.  You take certain medicines for conditions such as cancer, organ transplantation, and autoimmune conditions.  Hepatitis C blood testing is recommended for all people born from [birth year] through 1965 and any individual with known risks for hepatitis C.  Practice safe sex. Use condoms and avoid high-risk sexual practices to reduce the spread of sexually transmitted infections (STIs). STIs include gonorrhea, chlamydia, syphilis, trichomonas, herpes, HPV, and human immunodeficiency virus (HIV). Herpes, HIV, and HPV are viral illnesses that have no cure. They can result in disability, cancer, and death.  If you are a man who has sex with other men, you should be screened at least once per year for:  HIV.  Urethral, rectal, and pharyngeal infection of gonorrhea, chlamydia, or both.  If  you are at risk of being infected with HIV, it is recommended that you take a prescription medicine daily to prevent HIV infection. This is called preexposure prophylaxis (PrEP). You are considered at risk if:  You are a man who has sex with other men (MSM) and have other risk factors.  You are a heterosexual man, are sexually active, and are at increased risk for HIV infection.  You take drugs by injection.  You are sexually active with a partner who has HIV.  Talk with your health care provider about whether you are at high risk of being infected with HIV. If you choose to begin PrEP, you should first be tested for HIV. You should then be tested every 3 months for as long as you are taking PrEP.  A one-time screening for abdominal aortic aneurysm (AAA) and surgical repair of large AAAs by ultrasound are recommended for men ages 12 to 59 years who are current or former smokers.  Healthy men should no longer receive prostate-specific antigen (PSA) blood  tests as part of routine cancer screening. Talk with your health care provider about prostate cancer screening.  Testicular cancer screening is not recommended for adult males who have no symptoms. Screening includes self-exam, a health care provider exam, and other screening tests. Consult with your health care provider about any symptoms you have or any concerns you have about testicular cancer.  Use sunscreen. Apply sunscreen liberally and repeatedly throughout the day. You should seek shade when your shadow is shorter than you. Protect yourself by wearing long sleeves, pants, a wide-brimmed hat, and sunglasses year round, whenever you are outdoors.  Once a month, do a whole-body skin exam, using a mirror to look at the skin on your back. Tell your health care provider about new moles, moles that have irregular borders, moles that are larger than a pencil eraser, or moles that have changed in shape or color.  Stay current with required vaccines (immunizations).  Influenza vaccine. All adults should be immunized every year.  Tetanus, diphtheria, and acellular pertussis (Td, Tdap) vaccine. An adult who has not previously received Tdap or who does not know his vaccine status should receive 1 dose of Tdap. This initial dose should be followed by tetanus and diphtheria toxoids (Td) booster doses every 10 years. Adults with an unknown or incomplete history of completing a 3-dose immunization series with Td-containing vaccines should begin or complete a primary immunization series including a Tdap dose. Adults should receive a Td booster every 10 years.  Varicella vaccine. An adult without evidence of immunity to varicella should receive 2 doses or a second dose if he has previously received 1 dose.  Human papillomavirus (HPV) vaccine. Males aged 11-21 years who have not received the vaccine previously should receive the 3-dose series. Males aged 22-26 years may be immunized. Immunization is recommended  through the age of 60 years for any male who has sex with males and did not get any or all doses earlier. Immunization is recommended for any person with an immunocompromised condition through the age of 26 years if he did not get any or all doses earlier. During the 3-dose series, the second dose should be obtained 4-8 weeks after the first dose. The third dose should be obtained 24 weeks after the first dose and 16 weeks after the second dose.  Zoster vaccine. One dose is recommended for adults aged 57 years or older unless certain conditions are present.  Measles, mumps, and rubella (MMR) vaccine.  Adults born before [birth year] generally are considered immune to measles and mumps. Adults born in [birth year] or later should have 1 or more doses of MMR vaccine unless there is a contraindication to the vaccine or there is laboratory evidence of immunity to each of the three diseases. A routine second dose of MMR vaccine should be obtained at least 28 days after the first dose for students attending postsecondary schools, health care workers, or international travelers. People who received inactivated measles vaccine or an unknown type of measles vaccine during 1963-1967 should receive 2 doses of MMR vaccine. People who received inactivated mumps vaccine or an unknown type of mumps vaccine before 1979 and are at high risk for mumps infection should consider immunization with 2 doses of MMR vaccine. Unvaccinated health care workers born before [birth year] who lack laboratory evidence of measles, mumps, or rubella immunity or laboratory confirmation of disease should consider measles and mumps immunization with 2 doses of MMR vaccine or rubella immunization with 1 dose of MMR vaccine.  Pneumococcal 13-valent conjugate (PCV13) vaccine. When indicated, a person who is uncertain of his immunization history and has no record of immunization should receive the PCV13 vaccine. All adults 17 years of age and older should receive this  vaccine. An adult aged 3 years or older who has certain medical conditions and has not been previously immunized should receive 1 dose of PCV13 vaccine. This PCV13 should be followed with a dose of pneumococcal polysaccharide (PPSV23) vaccine. Adults who are at high risk for pneumococcal disease should obtain the PPSV23 vaccine at least 8 weeks after the dose of PCV13 vaccine. Adults older than 76 years of age who have normal immune system function should obtain the PPSV23 vaccine dose at least 1 year after the dose of PCV13 vaccine.  Pneumococcal polysaccharide (PPSV23) vaccine. When PCV13 is also indicated, PCV13 should be obtained first. All adults aged 6 years and older should be immunized. An adult younger than age 20 years who has certain medical conditions should be immunized. Any person who resides in a nursing home or long-term care facility should be immunized. An adult smoker should be immunized. People with an immunocompromised condition and certain other conditions should receive both PCV13 and PPSV23 vaccines. People with human immunodeficiency virus (HIV) infection should be immunized as soon as possible after diagnosis. Immunization during chemotherapy or radiation therapy should be avoided. Routine use of PPSV23 vaccine is not recommended for American Indians, [address], or people younger than 65 years unless there are medical conditions that require PPSV23 vaccine. When indicated, people who have unknown immunization and have no record of immunization should receive PPSV23 vaccine. One-time revaccination 5 years after the first dose of PPSV23 is recommended for people aged 19-64 years who have chronic kidney failure, nephrotic syndrome, asplenia, or immunocompromised conditions. People who received 1-2 doses of PPSV23 before age 52 years should receive another dose of PPSV23 vaccine at age 11 years or later if at least 5 years have passed since the previous dose. Doses of PPSV23 are not  needed for people immunized with PPSV23 at or after age 63 years.  Meningococcal vaccine. Adults with asplenia or persistent complement component deficiencies should receive 2 doses of quadrivalent meningococcal conjugate (MenACWY-D) vaccine. The doses should be obtained at least 2 months apart. Microbiologists working with certain meningococcal bacteria, military recruits, people at risk during an outbreak, and people who travel to or live in countries with a high rate of meningitis should be immunized. A first-year college  student up through age 78 years who is living in a residence hall should receive a dose if he did not receive a dose on or after his 16th birthday. Adults who have certain high-risk conditions should receive one or more doses of vaccine.  Hepatitis A vaccine. Adults who wish to be protected from this disease, have chronic liver disease, work with hepatitis A-infected animals, work in hepatitis A research labs, or travel to or work in countries with a high rate of hepatitis A should be immunized. Adults who were previously unvaccinated and who anticipate close contact with an international adoptee during the first 60 days after arrival in the [place] from a country with a high rate of hepatitis A should be immunized.  Hepatitis B vaccine. Adults should be immunized if they wish to be protected from this disease, are under age 17 years and have diabetes, have chronic liver disease, have had more than one sex partner in the past 6 months, may be exposed to blood or other infectious body fluids, are household contacts or sex partners of hepatitis B positive people, are clients or workers in certain care facilities, or travel to or work in countries with a high rate of hepatitis B.  Haemophilus influenzae type b (Hib) vaccine. A previously unvaccinated person with asplenia or sickle cell disease or having a scheduled splenectomy should receive 1 dose of Hib vaccine. Regardless of  previous immunization, a recipient of a hematopoietic stem cell transplant should receive a 3-dose series 6-12 months after his successful transplant. Hib vaccine is not recommended for adults with HIV infection. Preventive Service / Frequency Ages 49 to 40  Blood pressure check.** / Every 3-5 years.  Lipid and cholesterol check.** / Every 5 years beginning at age 82.  Hepatitis C blood test.** / For any individual with known risks for hepatitis C.  Skin self-exam. / Monthly.  Influenza vaccine. / Every year.  Tetanus, diphtheria, and acellular pertussis (Tdap, Td) vaccine.** / Consult your health care provider. 1 dose of Td every 10 years.  Varicella vaccine.** / Consult your health care provider.  HPV vaccine. / 3 doses over 6 months, if 52 or younger.  Measles, mumps, rubella (MMR) vaccine.** / You need at least 1 dose of MMR if you were born in 1957 or later. You may also need a second dose.  Pneumococcal 13-valent conjugate (PCV13) vaccine.** / Consult your health care provider.  Pneumococcal polysaccharide (PPSV23) vaccine.** / 1 to 2 doses if you smoke cigarettes or if you have certain conditions.  Meningococcal vaccine.** / 1 dose if you are age 56 to 39 years and a Market researcher living in a residence hall, or have one of several medical conditions. You may also need additional booster doses.  Hepatitis A vaccine.** / Consult your health care provider.  Hepatitis B vaccine.** / Consult your health care provider.  Haemophilus influenzae type b (Hib) vaccine.** / Consult your health care provider. Ages 66 to 53  Blood pressure check.** / Every year.  Lipid and cholesterol check.** / Every 5 years beginning at age 59.  Lung cancer screening. / Every year if you are aged 57-80 years and have a 30-pack-year history of smoking and currently smoke or have quit within the past 15 years. Yearly screening is stopped once you have quit smoking for at least 15 years or  develop a health problem that would prevent you from having lung cancer treatment.  Fecal occult blood test (FOBT) of stool. / Every  year beginning at age 85 and continuing until age 50. You may not have to do this test if you get a colonoscopy every 10 years.  Flexible sigmoidoscopy** or colonoscopy.** / Every 5 years for a flexible sigmoidoscopy or every 10 years for a colonoscopy beginning at age 8 and continuing until age 81.  Hepatitis C blood test.** / For all people born from [birth year] through 1965 and any individual with known risks for hepatitis C.  Skin self-exam. / Monthly.  Influenza vaccine. / Every year.  Tetanus, diphtheria, and acellular pertussis (Tdap/Td) vaccine.** / Consult your health care provider. 1 dose of Td every 10 years.  Varicella vaccine.** / Consult your health care provider.  Zoster vaccine.** / 1 dose for adults aged 43 years or older.  Measles, mumps, rubella (MMR) vaccine.** / You need at least 1 dose of MMR if you were born in 1958 or later. You may also need a second dose.  Pneumococcal 13-valent conjugate (PCV13) vaccine.** / Consult your health care provider.  Pneumococcal polysaccharide (PPSV23) vaccine.** / 1 to 2 doses if you smoke cigarettes or if you have certain conditions.  Meningococcal vaccine.** / Consult your health care provider.  Hepatitis A vaccine.** / Consult your health care provider.  Hepatitis B vaccine.** / Consult your health care provider.  Haemophilus influenzae type b (Hib) vaccine.** / Consult your health care provider. Ages 21 and over  Blood pressure check.** / Every year.  Lipid and cholesterol check.**/ Every 5 years beginning at age 36.  Lung cancer screening. / Every year if you are aged 72-80 years and have a 30-pack-year history of smoking and currently smoke or have quit within the past 15 years. Yearly screening is stopped once you have quit smoking for at least 15 years or develop a health problem that would  prevent you from having lung cancer treatment.  Fecal occult blood test (FOBT) of stool. / Every year beginning at age 57 and continuing until age 89. You may not have to do this test if you get a colonoscopy every 10 years.  Flexible sigmoidoscopy** or colonoscopy.** / Every 5 years for a flexible sigmoidoscopy or every 10 years for a colonoscopy beginning at age 27 and continuing until age 3.  Hepatitis C blood test.** / For all people born from [birth year] through 1966 and any individual with known risks for hepatitis C.  Abdominal aortic aneurysm (AAA) screening.** / A one-time screening for ages 7 to 19 years who are current or former smokers.  Skin self-exam. / Monthly.  Influenza vaccine. / Every year.  Tetanus, diphtheria, and acellular pertussis (Tdap/Td) vaccine.** / 1 dose of Td every 10 years.  Varicella vaccine.** / Consult your health care provider.  Zoster vaccine.** / 1 dose for adults aged 41 years or older.  Pneumococcal 13-valent conjugate (PCV13) vaccine.** / 1 dose for all adults aged 54 years and older.  Pneumococcal polysaccharide (PPSV23) vaccine.** / 1 dose for all adults aged 71 years and older.  Meningococcal vaccine.** / Consult your health care provider.  Hepatitis A vaccine.** / Consult your health care provider.  Hepatitis B vaccine.** / Consult your health care provider.  Haemophilus influenzae type b (Hib) vaccine.** / Consult your health care provider. **Family history and personal history of risk and conditions may change your health care provider's recommendations.   This information is not intended to replace advice given to you by your health care provider. Make sure you discuss any questions you have with your health care provider.  Document Released: 08/06/2001 Document Revised: 07/01/2014 Document Reviewed: 11/05/2010 Elsevier Interactive Patient Education [employer].

## 2015-12-20 NOTE — Assessment & Plan Note (Signed)
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 The patient's medical, surgical and social history  2. Their use of alcohol, tobacco or illicit drugs  3. Their current medications and supplements  4. The patient's functional ability including ADL's, fall risks, home safety risks and hearing or visual impairment.  5. Diet and physical activities  6. Evidence for depression or mood disorders 7. The roster of all physicians providing medical care to patient - is listed in the Snapshot section of the chart and reviewed today.    Today patient counseled on age appropriate routine health concerns for screening and prevention, each reviewed and up to date or declined. Immunizations reviewed and up to date or declined. Labs ordered and reviewed. Risk factors for depression reviewed and negative. Hearing function and visual acuity are intact. ADLs screened and addressed as needed. Functional ability and level of safety reviewed and appropriate. Education, counseling and referrals performed based on assessed risks today. Patient provided with a copy of personalized plan for preventive services.

## 2016-01-08 DIAGNOSIS — H5203 Hypermetropia, bilateral: Secondary | ICD-10-CM | POA: Diagnosis not present

## 2016-01-08 DIAGNOSIS — H40013 Open angle with borderline findings, low risk, bilateral: Secondary | ICD-10-CM | POA: Diagnosis not present

## 2016-01-08 DIAGNOSIS — H2513 Age-related nuclear cataract, bilateral: Secondary | ICD-10-CM | POA: Diagnosis not present

## 2016-01-08 DIAGNOSIS — H524 Presbyopia: Secondary | ICD-10-CM | POA: Diagnosis not present

## 2016-01-08 DIAGNOSIS — H02401 Unspecified ptosis of right eyelid: Secondary | ICD-10-CM | POA: Diagnosis not present

## 2016-01-17 DIAGNOSIS — H25813 Combined forms of age-related cataract, bilateral: Secondary | ICD-10-CM | POA: Diagnosis not present

## 2016-02-23 DIAGNOSIS — N401 Enlarged prostate with lower urinary tract symptoms: Secondary | ICD-10-CM | POA: Diagnosis not present

## 2016-03-05 ENCOUNTER — Emergency Department (HOSPITAL_COMMUNITY)
Admission: EM | Admit: 2016-03-05 | Discharge: 2016-03-05 | Disposition: A | Discharge status: Home / Self Care | Payer: Medicare Other | Attending: Emergency Medicine | Admitting: Emergency Medicine

## 2016-03-05 ENCOUNTER — Encounter (HOSPITAL_COMMUNITY): Payer: Self-pay | Admitting: Emergency Medicine

## 2016-03-05 DIAGNOSIS — Y929 Unspecified place or not applicable: Secondary | ICD-10-CM | POA: Insufficient documentation

## 2016-03-05 DIAGNOSIS — Z79899 Other long term (current) drug therapy: Secondary | ICD-10-CM | POA: Diagnosis not present

## 2016-03-05 DIAGNOSIS — Z87891 Personal history of nicotine dependence: Secondary | ICD-10-CM | POA: Insufficient documentation

## 2016-03-05 DIAGNOSIS — I10 Essential (primary) hypertension: Secondary | ICD-10-CM | POA: Diagnosis not present

## 2016-03-05 DIAGNOSIS — S61452A Open bite of left hand, initial encounter: Secondary | ICD-10-CM | POA: Diagnosis not present

## 2016-03-05 DIAGNOSIS — S61432A Puncture wound without foreign body of left hand, initial encounter: Secondary | ICD-10-CM | POA: Diagnosis not present

## 2016-03-05 DIAGNOSIS — Y939 Activity, unspecified: Secondary | ICD-10-CM | POA: Diagnosis not present

## 2016-03-05 DIAGNOSIS — W540XXA Bitten by dog, initial encounter: Secondary | ICD-10-CM | POA: Insufficient documentation

## 2016-03-05 DIAGNOSIS — Y999 Unspecified external cause status: Secondary | ICD-10-CM | POA: Insufficient documentation

## 2016-03-05 MED ORDER — DOXYCYCLINE HYCLATE 100 MG PO CAPS: 100.0000 mg | ORAL_CAPSULE | Freq: Two times a day (BID) | ORAL | 0 refills | Status: DC

## 2016-03-05 MED ORDER — METRONIDAZOLE 500 MG PO TABS: 500.0000 mg | ORAL_TABLET | Freq: Two times a day (BID) | ORAL | 0 refills | Status: DC

## 2016-03-05 MED ORDER — BACITRACIN ZINC 500 UNIT/GM EX OINT
TOPICAL_OINTMENT | CUTANEOUS | Status: AC
  Administered 2016-03-05: 23:00:00
  Filled 2016-03-05: qty 0.9

## 2016-03-05 NOTE — ED Triage Notes (Signed)
Patient here with complaints of dog bite to left hand. States that it is his dog, and was bit when trying to feed it. Reports being bit by dog before. Up to date on tetanus.

## 2016-03-05 NOTE — Discharge Instructions (Signed)
Take the prescribed medication as directed. Continue home wound care at home. Recommend dressing changes at least once daily. Keep wounds clean with soap and warm water. May wish to apply topical Neosporin to puncture wound. Follow-up with-- your primary care doctor. Return to the ED for new or worsening symptoms-- swelling, redness, warmth to touch, purulent drainage, high fever, etc.

## 2016-03-05 NOTE — ED Provider Notes (Signed)
[facility]-EMERGENCY DEPT Provider Note   CSN: [id number] Arrival date & time: 03/05/16  2033     History   Chief Complaint Chief Complaint  Patient presents with  . Animal Bite    HPI [name] is a 75 y.o. male.  The history is provided by the patient and medical records.    75 year old male with history of hyperlipidemia, hypertension, seasonal allergies, presenting to the ED for dog bite of his left hand. Patient states he put his dog's food ball down and his dog must have thought he still had something in his hands so he jumped up and bit his left hand. Patient has multiple puncture wounds noted to his left dorsal and palmar aspect of his left hand. He denies any numbness or weakness of his left hand. His tetanus is up-to-date.  Patient dog is fully vaccinated as well.  Past Medical History:  Diagnosis Date  . Benign prostatic hypertrophy   . Hyperlipidemia   . Hypertension   . Insomnia   . Parasomnia 10/14/2011   PSG 12/15/11>>AHI 0.4, SpO2 low 89%, PLMI 16, somnoliloquay, nocturia   . Seasonal allergies   . UTI (lower urinary tract infection)     Patient Active Problem List   Diagnosis Date Noted  . Cerumen impaction 08/22/2015  . Infection of right hand due to bite 04/20/2014  . Well adult exam 07/16/2013  . Ptosis 11/04/2011  . Twitching 11/04/2011  . Non-REM Parasomnia 10/14/2011  . Animal bite of hand 08/19/2011  . Wound infection (HCC) 08/19/2011  . Cellulitis and abscess of hand, except fingers and thumb 08/19/2011  . Hypokalemia 12/10/2010  . Hyperglycemia 12/10/2010  . Hyperlipidemia   . Benign prostatic hypertrophy   . LEG CRAMPS 03/07/2009  . OTITIS EXTERNA 08/30/2008  . PROSTATITIS 08/30/2008  . Essential hypertension 08/26/2007  . ERECTILE DYSFUNCTION 08/26/2007    Past Surgical History:  Procedure Laterality Date  . FINGER SURGERY Right 04/19/2014   RIGHT INDEX & RING FINGER   FROM DOG BITE   . I&D EXTREMITY Right 04/20/2014   Procedure:  IRRIGATION AND DEBRIDEMENT right index finger and right ring finger;  Surgeon: Dominica Severin, MD;  Location: [facility] OR;  Service: Orthopedics;  Laterality: Right;  . I&D EXTREMITY Right 04/21/2014   Procedure: IRRIGATION AND DEBRIDEMENT RIGHT HAND RING FINGER, INDEX FINGER AND Flexor Tenolysis and FDS Tenotomy;  Surgeon: Dominica Severin, MD;  Location: [facility] OR;  Service: Orthopedics;  Laterality: Right;       Home Medications    Prior to Admission medications   Medication Sig Start Date End Date Taking? Authorizing Provider  amLODipine-benazepril (LOTREL) 10-20 MG per capsule Take 1 capsule by mouth daily. 02/20/15 02/20/16  Georgina Quint Plotnikov, MD  aspirin 81 MG tablet Take 81 mg by mouth daily. Reported on 12/20/2015    Historical Provider, MD  Cholecalciferol (VITAMIN D) 1000 UNITS capsule Take 1 capsule (1,000 Units total) by mouth daily. 09/09/12   Georgina Quint Plotnikov, MD  clonazePAM (KLONOPIN) 0.5 MG tablet Take 1 tablet (0.5 mg total) by mouth at bedtime as needed for anxiety (sleep). Reported on 08/22/2015 08/22/15   Georgina Quint Plotnikov, MD  naproxen sodium (ANAPROX) 220 MG tablet Take 220 mg by mouth 2 (two) times daily as needed (pain). Reported on 08/22/2015    Historical Provider, MD  potassium chloride (KLOR-CON) 8 MEQ tablet Take 1 tablet (8 MEQ total) by mouth daily. 08/22/15   Georgina Quint Plotnikov, MD  tamsulosin (FLOMAX) 0.4 MG CAPS capsule Reported  on 12/20/2015 01/27/15   Historical Provider, MD    Family History Family History  Problem Relation Age of Onset  . Diabetes Mother   . Diabetes Other     Social History Social History  Substance Use Topics  . Smoking status: Former Smoker    Packs/day: 1.00    Years: 7.00    Types: Cigarettes, Cigars  . Smokeless tobacco: Never Used     Comment: cigars day/ 1-2       QIUIT SMOKING IN 2000  . Alcohol use Yes     Comment: OCCASSIONAL WINE -1 glass/month     Allergies   Atorvastatin; Penicillins; and Sulfonamide  derivatives   Review of Systems Review of Systems  Skin: Positive for wound.  All other systems reviewed and are negative.    Physical Exam Updated Vital Signs BP 171/82 (BP Location: Left Arm)   Pulse 66   Temp 98.4 F (36.9 C) (Oral)   Resp 18   SpO2 95%   Physical Exam  Constitutional: He is oriented to person, place, and time. He appears well-developed and well-nourished.  HENT:  Head: Normocephalic and atraumatic.  Mouth/Throat: Oropharynx is clear and moist.  Eyes: Conjunctivae and EOM are normal. Pupils are equal, round, and reactive to light.  Neck: Normal range of motion.  Cardiovascular: Normal rate, regular rhythm and normal heart sounds.   Pulmonary/Chest: Effort normal and breath sounds normal.  Abdominal: Soft. Bowel sounds are normal.  Musculoskeletal: Normal range of motion.  Left hand with 3 small puncture wounds to the dorsal hand and 3 small puncture wounds to palm of hand as well; no active bleeding or purulent drainage; wounds are not gaping and skin remains well approximated; full ROM of all fingers; normal cap refill, normal sensation; no significant swelling, compartments soft  Neurological: He is alert and oriented to person, place, and time.  Skin: Skin is warm and dry.  Psychiatric: He has a normal mood and affect.  Nursing note and vitals reviewed.    ED Treatments / Results  Labs (all labs ordered are listed, but only abnormal results are displayed) Labs Reviewed - No data to display  EKG  EKG Interpretation None       Radiology No results found.  Procedures Procedures (including critical care time)  Medications Ordered in ED Medications - No data to display   Initial Impression / Assessment and Plan / ED Course  I have reviewed the triage vital signs and the nursing notes.  Pertinent labs & imaging results that were available during my care of the patient were reviewed by me and considered in my medical decision making (see  chart for details).  Clinical Course   75 y.o. M here with dog bite to left hand.  This was his dog which is fully vaccinated.  His tetanus is UTD.  Multiple puncture wounds noted to left hand.  Wounds are hemostatic and remain overall well approximated.  Compartments soft, easily compressible.  Hand is neurovascularly intact.  Wounds cleansed and dressed here.  Will start on abx-- doxycycline and flagyl given his penicillin allergy.  Patient does have hx of abscess formation with dog bite in the past.  I do not see any evidence of this currently, however discussed with patient and his wife strict return precautions for any signs or symptoms concerning for this such as significant swelling, erythema, warmth to touch, or purulent drainage.  Final Clinical Impressions(s) / ED Diagnoses   Final diagnoses:  Dog bite  New Prescriptions New Prescriptions   DOXYCYCLINE (VIBRAMYCIN) 100 MG CAPSULE    Take 1 capsule (100 mg total) by mouth 2 (two) times daily.   METRONIDAZOLE (FLAGYL) 500 MG TABLET    Take 1 tablet (500 mg total) by mouth 2 (two) times daily.     Garlon Hatchet, PA-C 03/05/16 2311    Lorre Nick, MD 03/09/16 [phone]

## 2016-03-07 ENCOUNTER — Encounter (HOSPITAL_COMMUNITY): Payer: Self-pay | Admitting: Emergency Medicine

## 2016-03-07 ENCOUNTER — Emergency Department (HOSPITAL_COMMUNITY)
Admission: EM | Admit: 2016-03-07 | Discharge: 2016-03-07 | Disposition: A | Discharge status: Home / Self Care | Payer: Medicare Other | Attending: Emergency Medicine | Admitting: Emergency Medicine

## 2016-03-07 ENCOUNTER — Emergency Department (HOSPITAL_COMMUNITY): Payer: Medicare Other

## 2016-03-07 ENCOUNTER — Encounter: Payer: Self-pay | Admitting: Internal Medicine

## 2016-03-07 ENCOUNTER — Ambulatory Visit (INDEPENDENT_AMBULATORY_CARE_PROVIDER_SITE_OTHER): Payer: Medicare Other | Admitting: Internal Medicine

## 2016-03-07 VITALS — BP 132/76 | HR 78 | Resp 20 | Wt 207.4 lb

## 2016-03-07 DIAGNOSIS — L089 Local infection of the skin and subcutaneous tissue, unspecified: Secondary | ICD-10-CM

## 2016-03-07 DIAGNOSIS — W540XXD Bitten by dog, subsequent encounter: Secondary | ICD-10-CM | POA: Diagnosis not present

## 2016-03-07 DIAGNOSIS — S61452D Open bite of left hand, subsequent encounter: Secondary | ICD-10-CM | POA: Diagnosis not present

## 2016-03-07 DIAGNOSIS — I10 Essential (primary) hypertension: Secondary | ICD-10-CM

## 2016-03-07 DIAGNOSIS — Z79899 Other long term (current) drug therapy: Secondary | ICD-10-CM | POA: Insufficient documentation

## 2016-03-07 DIAGNOSIS — Z87891 Personal history of nicotine dependence: Secondary | ICD-10-CM | POA: Diagnosis not present

## 2016-03-07 DIAGNOSIS — L02414 Cutaneous abscess of left upper limb: Secondary | ICD-10-CM | POA: Diagnosis not present

## 2016-03-07 DIAGNOSIS — T148XXA Other injury of unspecified body region, initial encounter: Secondary | ICD-10-CM

## 2016-03-07 DIAGNOSIS — M7989 Other specified soft tissue disorders: Secondary | ICD-10-CM | POA: Diagnosis not present

## 2016-03-07 LAB — CBC WITH DIFFERENTIAL/PLATELET
BASOS ABS: 0 10*3/uL (ref 0.0–0.1)
Basophils Relative: 0 %
Eosinophils Absolute: 0.1 10*3/uL (ref 0.0–0.7)
Eosinophils Relative: 2 %
HEMATOCRIT: 43 % (ref 39.0–52.0)
HEMOGLOBIN: 14.9 g/dL (ref 13.0–17.0)
LYMPHS ABS: 1.6 10*3/uL (ref 0.7–4.0)
LYMPHS PCT: 27 %
MCH: 30.1 pg (ref 26.0–34.0)
MCHC: 34.7 g/dL (ref 30.0–36.0)
MCV: 86.9 fL (ref 78.0–100.0)
MONO ABS: 0.8 10*3/uL (ref 0.1–1.0)
Monocytes Relative: 14 %
NEUTROS ABS: 3.3 10*3/uL (ref 1.7–7.7)
Neutrophils Relative %: 57 %
Platelets: 168 10*3/uL (ref 150–400)
RBC: 4.95 MIL/uL (ref 4.22–5.81)
RDW: 12.7 % (ref 11.5–15.5)
WBC: 5.8 10*3/uL (ref 4.0–10.5)

## 2016-03-07 LAB — COMPREHENSIVE METABOLIC PANEL
ALBUMIN: 4 g/dL (ref 3.5–5.0)
ALT: 14 U/L — ABNORMAL LOW (ref 17–63)
ANION GAP: 7 (ref 5–15)
AST: 15 U/L (ref 15–41)
Alkaline Phosphatase: 81 U/L (ref 38–126)
BILIRUBIN TOTAL: 1.8 mg/dL — AB (ref 0.3–1.2)
BUN: 14 mg/dL (ref 6–20)
CHLORIDE: 104 mmol/L (ref 101–111)
CO2: 27 mmol/L (ref 22–32)
Calcium: 8.9 mg/dL (ref 8.9–10.3)
Creatinine, Ser: 1.03 mg/dL (ref 0.61–1.24)
GFR calc Af Amer: >60 mL/min (ref >60)
GFR calc non Af Amer: >60 mL/min (ref >60)
GLUCOSE: 113 mg/dL — AB (ref 65–99)
POTASSIUM: 3.6 mmol/L (ref 3.5–5.1)
SODIUM: 138 mmol/L (ref 135–145)
TOTAL PROTEIN: 7 g/dL (ref 6.5–8.1)

## 2016-03-07 LAB — I-STAT CG4 LACTIC ACID, ED: Lactic Acid, Venous: 0.83 mmol/L (ref 0.5–1.9)

## 2016-03-07 MED ORDER — LIDOCAINE-EPINEPHRINE (PF) 2 %-1:200000 IJ SOLN
20.0000 mL | Freq: Once | INTRAMUSCULAR | Status: AC
  Administered 2016-03-07: 20 mL
  Filled 2016-03-07: qty 20

## 2016-03-07 MED ORDER — VANCOMYCIN HCL IN DEXTROSE 1-5 GM/200ML-% IV SOLN
1000.0000 mg | Freq: Once | INTRAVENOUS | Status: AC
  Administered 2016-03-07: 1000 mg via INTRAVENOUS
  Filled 2016-03-07: qty 200

## 2016-03-07 MED ORDER — VANCOMYCIN HCL IN DEXTROSE 1-5 GM/200ML-% IV SOLN: 1000.0000 mg | Freq: Two times a day (BID) | INTRAVENOUS | Status: DC

## 2016-03-07 MED ORDER — SODIUM CHLORIDE 0.9 % IV BOLUS (SEPSIS)
1000.0000 mL | Freq: Once | INTRAVENOUS | Status: AC
  Administered 2016-03-07: 1000 mL via INTRAVENOUS

## 2016-03-07 NOTE — Patient Instructions (Signed)
You are advised to go to the ER now, as you need IV antibiotics and further evaluation of the hand  Please continue all other medications as before  Please have the pharmacy call with any other refills you may need.  Please keep your appointments with your specialists as you may have planned

## 2016-03-07 NOTE — Progress Notes (Signed)
Pharmacy Antibiotic Follow-up Note  [name] is a 75 y.o. year-old male admitted on 03/07/2016.  The patient is currently on day 1 of Vancomycin for cellulitis secondary to dog bite, previously treated with po Doxycycline & Flagyl, note PCN allergy: reaction Hives.  Assessment/Plan: Vancomycin 1 IV every 12 hours.  Goal trough 10-15 mcg/mL.  Consider anaerobic coverage  Temp (24hrs), Avg:98.2 F (36.8 C), Min:98.2 F (36.8 C), Max:98.2 F (36.8 C)   Recent Labs Lab 03/07/16 1633  WBC 5.8    Recent Labs Lab 03/07/16 1633  CREATININE 1.03   Estimated Creatinine Clearance: 67.8 mL/min (by C-G formula based on SCr of 1.03 mg/dL).    Allergies  Allergen Reactions  . Atorvastatin     REACTION: leg cramps  . Penicillins Hives  . Sulfonamide Derivatives Hives   Antimicrobials this admission: 9/14 Vancomycin  >>   Levels/dose changes this admission:  Microbiology results: 9/14 BCx: sent  Thank you for allowing pharmacy to be a part of this patient's care.  Otho BellowsGreen, Jodye Scali L PharmD 03/07/2016 6:52 PM

## 2016-03-07 NOTE — Discharge Instructions (Signed)
If hand is more red and swollen tomorrow, come back to the ED.  Continue current antibiotics.

## 2016-03-07 NOTE — Progress Notes (Signed)
Pre visit review using our clinic review tool, if applicable. No additional management support is needed unless otherwise documented below in the visit note. 

## 2016-03-07 NOTE — Assessment & Plan Note (Signed)
stable overall by history and exam, recent data reviewed with pt, and pt to continue medical treatment as before,  to f/u any worsening symptoms or concerns BP Readings from Last 3 Encounters:  03/07/16 132/76  03/05/16 168/72  12/20/15 130/72

## 2016-03-07 NOTE — ED Notes (Signed)
MD at bedside. 

## 2016-03-07 NOTE — Progress Notes (Signed)
Subjective:    Patient ID: [name], male    DOB: [birth year], 75 y.o.   MRN: [id number]  HPI  Here to f/u dog bite to left hand , seen at ER sept 12, has PCN allergy, placed on doxy and flagyl course, but unfortunately though no fever or sepsis signs and has taken all meds,, he has worsening left hand swelling and discomfort. Had some small drainage this am, not sure of color. No other trauma, fever, chills.   Pt denies chest pain, increased sob or doe, wheezing, orthopnea, PND, increased LE swelling, palpitations, dizziness or syncope.  Pt denies new neurological symptoms such as new headache, or facial or extremity weakness or numbness   Pt denies polydipsia, polyuria Past Medical History:  Diagnosis Date  . Benign prostatic hypertrophy   . Hyperlipidemia   . Hypertension   . Insomnia   . Parasomnia 10/14/2011   PSG 12/15/11>>AHI 0.4, SpO2 low 89%, PLMI 16, somnoliloquay, nocturia   . Seasonal allergies   . UTI (lower urinary tract infection)    Past Surgical History:  Procedure Laterality Date  . FINGER SURGERY Right 04/19/2014   RIGHT INDEX & RING FINGER   FROM DOG BITE   . I&D EXTREMITY Right 04/20/2014   Procedure: IRRIGATION AND DEBRIDEMENT right index finger and right ring finger;  Surgeon: Dominica Severin, MD;  Location: [facility] OR;  Service: Orthopedics;  Laterality: Right;  . I&D EXTREMITY Right 04/21/2014   Procedure: IRRIGATION AND DEBRIDEMENT RIGHT HAND RING FINGER, INDEX FINGER AND Flexor Tenolysis and FDS Tenotomy;  Surgeon: Dominica Severin, MD;  Location: [facility] OR;  Service: Orthopedics;  Laterality: Right;    reports that he has quit smoking. His smoking use included Cigarettes and Cigars. He has a 7.00 pack-year smoking history. He has never used smokeless tobacco. He reports that he drinks alcohol. He reports that he does not use drugs. family history includes Diabetes in his mother and other. Allergies  Allergen Reactions  . Atorvastatin     REACTION: leg cramps  .  Penicillins Hives  . Sulfonamide Derivatives Hives   Current Outpatient Prescriptions on File Prior to Visit  Medication Sig Dispense Refill  . aspirin 81 MG tablet Take 81 mg by mouth daily. Reported on 12/20/2015    . Cholecalciferol (VITAMIN D) 1000 UNITS capsule Take 1 capsule (1,000 Units total) by mouth daily. 100 capsule 3  . clonazePAM (KLONOPIN) 0.5 MG tablet Take 1 tablet (0.5 mg total) by mouth at bedtime as needed for anxiety (sleep). Reported on 08/22/2015 90 tablet 1  . doxycycline (VIBRAMYCIN) 100 MG capsule Take 1 capsule (100 mg total) by mouth 2 (two) times daily. 20 capsule 0  . metroNIDAZOLE (FLAGYL) 500 MG tablet Take 1 tablet (500 mg total) by mouth 2 (two) times daily. 20 tablet 0  . naproxen sodium (ANAPROX) 220 MG tablet Take 220 mg by mouth 2 (two) times daily as needed (pain). Reported on 08/22/2015    . potassium chloride (KLOR-CON) 8 MEQ tablet Take 1 tablet (8 MEQ total) by mouth daily. 30 tablet 11  . tamsulosin (FLOMAX) 0.4 MG CAPS capsule Reported on 12/20/2015  5  . amLODipine-benazepril (LOTREL) 10-20 MG per capsule Take 1 capsule by mouth daily. 90 capsule 3   No current facility-administered medications on file prior to visit.    Review of Systems All otherwise neg per pt     Objective:   Physical Exam BP 132/76   Pulse 78   Resp 20  Wt 207 lb 6 oz (94.1 kg)   SpO2 95%   BMI 32.48 kg/m  VS noted,  Constitutional: Pt appears in no apparent distress HENT: Head: NCAT.  Right Ear: External ear normal.  Left Ear: External ear normal.  Eyes: . Pupils are equal, round, and reactive to light. Conjunctivae and EOM are normal Neck: Normal range of motion. Neck supple.  Cardiovascular: Normal rate and regular rhythm.   Pulmonary/Chest: Effort normal and breath sounds without rales or wheezing.  Abd:  Soft, NT, ND, + BS Neurological: Pt is alert. Not confused , motor grossly intact Skin: No rash, no LE edema, but left hand posteriorly with 3+ swelling,  tender, ? Fluctuance and small drainage at bite sites, hand o/w neurovasc intact Psychiatric: Pt behavior is normal. No agitation.     Assessment & Plan:

## 2016-03-07 NOTE — Assessment & Plan Note (Signed)
Worsening s/s, non toxic/septic but exam worsening despite good med compliance, I have advised pt to go to ER now, consider CT hand r/o abscess (or MRI) and likely to require admit for IV antibx

## 2016-03-07 NOTE — ED Provider Notes (Signed)
[facility]-EMERGENCY DEPT Provider Note   CSN: [id number] Arrival date & time: 03/07/16  1511     History   Chief Complaint Chief Complaint  Patient presents with  . Animal Bite  . Wound Infection    HPI [name] is a 75 y.o. male.  Pt said that he was bitten by his dog on his left hand on 9/12.  He was seen here in the ED and had his wounds cleaned and dressed.  He was put on doxycycline and flagyl as he is pcn allergic.  Pt said that he's been taking them, but his left hand has gotten more red and swollen.  He went to see his pcp today who sent him here for IV abx.  The dog's vaccinations were up to date and his tetanus was up to date.      Past Medical History:  Diagnosis Date  . Benign prostatic hypertrophy   . Hyperlipidemia   . Hypertension   . Insomnia   . Parasomnia 10/14/2011   PSG 12/15/11>>AHI 0.4, SpO2 low 89%, PLMI 16, somnoliloquay, nocturia   . Seasonal allergies   . UTI (lower urinary tract infection)     Patient Active Problem List   Diagnosis Date Noted  . Cerumen impaction 08/22/2015  . Infection of right hand due to bite 04/20/2014  . Well adult exam 07/16/2013  . Ptosis 11/04/2011  . Twitching 11/04/2011  . Non-REM Parasomnia 10/14/2011  . Dog bite of left hand 08/19/2011  . Wound infection (HCC) 08/19/2011  . Cellulitis and abscess of hand, except fingers and thumb 08/19/2011  . Hypokalemia 12/10/2010  . Hyperglycemia 12/10/2010  . Hyperlipidemia   . Benign prostatic hypertrophy   . LEG CRAMPS 03/07/2009  . OTITIS EXTERNA 08/30/2008  . PROSTATITIS 08/30/2008  . Essential hypertension 08/26/2007  . ERECTILE DYSFUNCTION 08/26/2007    Past Surgical History:  Procedure Laterality Date  . FINGER SURGERY Right 04/19/2014   RIGHT INDEX & RING FINGER   FROM DOG BITE   . I&D EXTREMITY Right 04/20/2014   Procedure: IRRIGATION AND DEBRIDEMENT right index finger and right ring finger;  Surgeon: Dominica Severin, MD;  Location: [facility] OR;  Service:  Orthopedics;  Laterality: Right;  . I&D EXTREMITY Right 04/21/2014   Procedure: IRRIGATION AND DEBRIDEMENT RIGHT HAND RING FINGER, INDEX FINGER AND Flexor Tenolysis and FDS Tenotomy;  Surgeon: Dominica Severin, MD;  Location: [facility] OR;  Service: Orthopedics;  Laterality: Right;       Home Medications    Prior to Admission medications   Medication Sig Start Date End Date Taking? Authorizing Provider  Cholecalciferol (VITAMIN D) 1000 UNITS capsule Take 1 capsule (1,000 Units total) by mouth daily. 09/09/12  Yes Georgina Quint Plotnikov, MD  doxycycline (VIBRAMYCIN) 100 MG capsule Take 1 capsule (100 mg total) by mouth 2 (two) times daily. 03/05/16  Yes Garlon Hatchet, PA-C  metroNIDAZOLE (FLAGYL) 500 MG tablet Take 1 tablet (500 mg total) by mouth 2 (two) times daily. 03/05/16  Yes Garlon Hatchet, PA-C  potassium chloride (KLOR-CON) 8 MEQ tablet Take 1 tablet (8 MEQ total) by mouth daily. 08/22/15  Yes Georgina Quint Plotnikov, MD  tamsulosin (FLOMAX) 0.4 MG CAPS capsule Take 0.4 mg by mouth daily after supper.  02/23/16  Yes Historical Provider, MD  amLODipine-benazepril (LOTREL) 10-20 MG per capsule Take 1 capsule by mouth daily. 02/20/15 02/20/16  Georgina Quint Plotnikov, MD  clonazePAM (KLONOPIN) 0.5 MG tablet Take 1 tablet (0.5 mg total) by mouth at bedtime as needed  for anxiety (sleep). Reported on 08/22/2015 08/22/15   Georgina Quint Plotnikov, MD  naproxen sodium (ANAPROX) 220 MG tablet Take 220 mg by mouth 2 (two) times daily as needed (pain). Reported on 08/22/2015    Historical Provider, MD    Family History Family History  Problem Relation Age of Onset  . Diabetes Mother   . Diabetes Other     Social History Social History  Substance Use Topics  . Smoking status: Former Smoker    Packs/day: 1.00    Years: 7.00    Types: Cigarettes, Cigars  . Smokeless tobacco: Never Used     Comment: cigars day/ 1-2       QIUIT SMOKING IN 2000  . Alcohol use Yes     Comment: OCCASSIONAL WINE -1 glass/month      Allergies   Atorvastatin; Penicillins; and Sulfonamide derivatives   Review of Systems Review of Systems  Skin: Positive for color change and wound.  All other systems reviewed and are negative.    Physical Exam Updated Vital Signs BP 172/87 (BP Location: Left Arm)   Pulse 74   Temp 98.2 F (36.8 C) (Oral)   Resp 18   Ht 5\' 8"  (1.727 m)   Wt 207 lb (93.9 kg)   SpO2 97%   BMI 31.47 kg/m   Physical Exam  Constitutional: He is oriented to person, place, and time. He appears well-developed and well-nourished.  HENT:  Head: Normocephalic and atraumatic.  Right Ear: External ear normal.  Left Ear: External ear normal.  Nose: Nose normal.  Mouth/Throat: Oropharynx is clear and moist.  Eyes: Conjunctivae and EOM are normal. Pupils are equal, round, and reactive to light.  Neck: Normal range of motion. Neck supple.  Cardiovascular: Normal rate, regular rhythm, normal heart sounds and intact distal pulses.   Pulmonary/Chest: Effort normal and breath sounds normal.  Abdominal: Soft. Bowel sounds are normal.  Musculoskeletal: Normal range of motion.  Neurological: He is alert and oriented to person, place, and time.  Skin: Skin is warm and dry.  Left hand with multiple puncture wounds with surrounding cellulitis  Psychiatric: He has a normal mood and affect. His behavior is normal. Judgment and thought content normal.  Nursing note and vitals reviewed.    ED Treatments / Results  Labs (all labs ordered are listed, but only abnormal results are displayed) Labs Reviewed  COMPREHENSIVE METABOLIC PANEL - Abnormal; Notable for the following:       Result Value   Glucose, Bld 113 (*)    ALT 14 (*)    Total Bilirubin 1.8 (*)    All other components within normal limits  CULTURE, BLOOD (ROUTINE X 2)  CULTURE, BLOOD (ROUTINE X 2)  CBC WITH DIFFERENTIAL/PLATELET  I-STAT CG4 LACTIC ACID, ED    EKG  EKG Interpretation None       Radiology Dg Hand Complete  Left  Result Date: 03/07/2016 CLINICAL DATA:  75 year old male with dog bite to left hand this week. Increasing soft tissue swelling and drainage. Subsequent encounter. EXAM: LEFT HAND - COMPLETE 3+ VIEW COMPARISON:  Left hand radiographs 08/19/2011. FINDINGS: Distal radius and ulna appear stable and intact. Carpal bones appear stable and intact. Metacarpals appear stable and intact. There is chronic degenerative spurring of the dorsal third metatarsal head. No cortical osteolysis identified. Phalanges appear stable and intact. Diffuse soft tissue swelling, perhaps maximal along the dorsum of the hand at the metacarpal level. No subcutaneous gas. No radiopaque foreign body identified. IMPRESSION: Soft tissue  swelling maximal at the level of the metacarpals. No subcutaneous gas or retained radiopaque foreign body. No acute fracture or plain radiographic evidence of osteomyelitis. Electronically Signed   By: Odessa FlemingH  Hall M.D.   On: 03/07/2016 19:53    Procedures .[name].Incision and Drainage Date/Time: 03/07/2016 7:53 PM Performed by: Jacalyn LefevreHAVILAND, Hattie Pine Authorized by: Jacalyn LefevreHAVILAND, Mariabelen Pressly   Consent:    Consent obtained:  Verbal   Consent given by:  Patient   Risks discussed:  Bleeding, incomplete drainage and pain   Alternatives discussed:  No treatment Location:    Type:  Abscess   Location:  Upper extremity   Upper extremity location:  Hand   Hand location:  L hand Pre-procedure details:    Skin preparation:  Antiseptic wash Anesthesia (see MAR for exact dosages):    Anesthesia method:  Local infiltration   Local anesthetic:  Lidocaine 2% WITH epi Procedure type:    Complexity:  Simple Procedure details:    Incision types:  Cruciate   Scalpel blade:  11   Wound management:  Probed and deloculated   Drainage:  Bloody and purulent   Drainage amount:  Moderate   Wound treatment:  Wound left open   Packing materials:  None Post-procedure details:    Patient tolerance of procedure:  Tolerated well, no  immediate complications   (including critical care time)  Medications Ordered in ED Medications  lidocaine-EPINEPHrine (XYLOCAINE W/EPI) 2 %-1:200000 (PF) injection 20 mL (not administered)  vancomycin (VANCOCIN) IVPB 1000 mg/200 mL premix (not administered)  sodium chloride 0.9 % bolus 1,000 mL (1,000 mLs Intravenous New Bag/Given 03/07/16 1929)  vancomycin (VANCOCIN) IVPB 1000 mg/200 mL premix (1,000 mg Intravenous New Bag/Given 03/07/16 1929)     Initial Impression / Assessment and Plan / ED Course  I have reviewed the triage vital signs and the nursing notes.  Pertinent labs & imaging results that were available during my care of the patient were reviewed by me and considered in my medical decision making (see chart for details).  Clinical Course   Pt told to return tomorrow if worse after IV vanc and I&D.  Pt told to continue current antibiotics.   Final Clinical Impressions(s) / ED Diagnoses   Final diagnoses:  Wound infection [facility](HCC)    New Prescriptions New Prescriptions   No medications on file     Jacalyn LefevreJulie Adama Ivins, MD 03/07/16 2036

## 2016-03-07 NOTE — ED Triage Notes (Signed)
Pt was seen in this ED on Tuesday for a dog bite to left hand. Sent home with doxycycline and reports compliance with meds. Reports left hand is increasingly swollen, red and oozing. Pt was seen at PCP today and instructed to come to ED for IV antibiotics.

## 2016-03-11 DIAGNOSIS — H00013 Hordeolum externum right eye, unspecified eyelid: Secondary | ICD-10-CM | POA: Diagnosis not present

## 2016-03-11 DIAGNOSIS — G518 Other disorders of facial nerve: Secondary | ICD-10-CM | POA: Diagnosis not present

## 2016-03-11 DIAGNOSIS — H02403 Unspecified ptosis of bilateral eyelids: Secondary | ICD-10-CM | POA: Diagnosis not present

## 2016-03-12 LAB — CULTURE, BLOOD (ROUTINE X 2)
CULTURE: NO GROWTH
Culture: NO GROWTH

## 2016-04-10 ENCOUNTER — Encounter (HOSPITAL_COMMUNITY): Payer: Self-pay | Admitting: *Deleted

## 2016-04-10 ENCOUNTER — Emergency Department (HOSPITAL_COMMUNITY): Payer: Medicare Other

## 2016-04-10 ENCOUNTER — Emergency Department (HOSPITAL_COMMUNITY)
Admission: EM | Admit: 2016-04-10 | Discharge: 2016-04-10 | Disposition: A | Discharge status: Home / Self Care | Payer: Medicare Other | Attending: Emergency Medicine | Admitting: Emergency Medicine

## 2016-04-10 DIAGNOSIS — X501XXA Overexertion from prolonged static or awkward postures, initial encounter: Secondary | ICD-10-CM | POA: Diagnosis not present

## 2016-04-10 DIAGNOSIS — Y929 Unspecified place or not applicable: Secondary | ICD-10-CM | POA: Insufficient documentation

## 2016-04-10 DIAGNOSIS — S39012A Strain of muscle, fascia and tendon of lower back, initial encounter: Secondary | ICD-10-CM | POA: Diagnosis not present

## 2016-04-10 DIAGNOSIS — M545 Low back pain: Secondary | ICD-10-CM | POA: Diagnosis not present

## 2016-04-10 DIAGNOSIS — Y999 Unspecified external cause status: Secondary | ICD-10-CM | POA: Diagnosis not present

## 2016-04-10 DIAGNOSIS — Z87891 Personal history of nicotine dependence: Secondary | ICD-10-CM | POA: Diagnosis not present

## 2016-04-10 DIAGNOSIS — I10 Essential (primary) hypertension: Secondary | ICD-10-CM | POA: Diagnosis not present

## 2016-04-10 DIAGNOSIS — Y939 Activity, unspecified: Secondary | ICD-10-CM | POA: Diagnosis not present

## 2016-04-10 DIAGNOSIS — S3992XA Unspecified injury of lower back, initial encounter: Secondary | ICD-10-CM | POA: Diagnosis present

## 2016-04-10 DIAGNOSIS — Z79899 Other long term (current) drug therapy: Secondary | ICD-10-CM | POA: Insufficient documentation

## 2016-04-10 DIAGNOSIS — R52 Pain, unspecified: Secondary | ICD-10-CM

## 2016-04-10 LAB — URINALYSIS, ROUTINE W REFLEX MICROSCOPIC
GLUCOSE, UA: NEGATIVE mg/dL
HGB URINE DIPSTICK: NEGATIVE
Ketones, ur: NEGATIVE mg/dL
Leukocytes, UA: NEGATIVE
Nitrite: NEGATIVE
Protein, ur: NEGATIVE mg/dL
SPECIFIC GRAVITY, URINE: 1.028 (ref 1.005–1.030)
pH: 5.5 (ref 5.0–8.0)

## 2016-04-10 MED ORDER — CYCLOBENZAPRINE HCL 10 MG PO TABS
10.0000 mg | ORAL_TABLET | Freq: Every day | ORAL | 0 refills | Status: AC
Start: 2016-04-10 — End: 2016-04-20

## 2016-04-10 MED ORDER — ACETAMINOPHEN 500 MG PO TABS: 1000.0000 mg | ORAL_TABLET | Freq: Three times a day (TID) | ORAL | 0 refills | Status: AC

## 2016-04-10 MED ORDER — ACETAMINOPHEN 500 MG PO TABS
1000.0000 mg | ORAL_TABLET | Freq: Once | ORAL | Status: AC
  Administered 2016-04-10: 1000 mg via ORAL
  Filled 2016-04-10: qty 2

## 2016-04-10 MED ORDER — METHOCARBAMOL 500 MG PO TABS
500.0000 mg | ORAL_TABLET | Freq: Once | ORAL | Status: AC
  Administered 2016-04-10: 500 mg via ORAL
  Filled 2016-04-10: qty 1

## 2016-04-10 MED ORDER — NAPROXEN 375 MG PO TABS: 375.0000 mg | ORAL_TABLET | Freq: Two times a day (BID) | ORAL | 0 refills | Status: AC

## 2016-04-10 NOTE — ED Notes (Signed)
Pt denies dysuria; does c/o urinary frequency.  Pt stated "I had a urologist but he retired so I don't go anymore."

## 2016-04-10 NOTE — ED Provider Notes (Signed)
[facility]-EMERGENCY DEPT Provider Note   CSN: [id number] Arrival date & time: 04/10/16  1456     History   Chief Complaint Chief Complaint  Patient presents with  . Back Pain  . Urinary Frequency    HPI [name] is a 75 y.o. male.  The history is provided by the patient.  Back Pain   This is a new problem. The current episode started 2 days ago. The problem occurs constantly. The problem has been gradually worsening. The pain is associated with twisting. The pain is present in the lumbar spine and sacro-iliac joint. The pain does not radiate. The pain is moderate. The symptoms are aggravated by bending, twisting and certain positions. Pertinent negatives include no chest pain, no fever, no abdominal pain, no abdominal swelling, no bowel incontinence, no perianal numbness, no bladder incontinence, no dysuria, no pelvic pain, no leg pain and no paresthesias.    Past Medical History:  Diagnosis Date  . Benign prostatic hypertrophy   . Hyperlipidemia   . Hypertension   . Insomnia   . Parasomnia 10/14/2011   PSG 12/15/11>>AHI 0.4, SpO2 low 89%, PLMI 16, somnoliloquay, nocturia   . Seasonal allergies   . UTI (lower urinary tract infection)     Patient Active Problem List   Diagnosis Date Noted  . Cerumen impaction 08/22/2015  . Infection of right hand due to bite 04/20/2014  . Well adult exam 07/16/2013  . Ptosis 11/04/2011  . Twitching 11/04/2011  . Non-REM Parasomnia 10/14/2011  . Dog bite of left hand 08/19/2011  . Wound infection 08/19/2011  . Cellulitis and abscess of hand, except fingers and thumb 08/19/2011  . Hypokalemia 12/10/2010  . Hyperglycemia 12/10/2010  . Hyperlipidemia   . Benign prostatic hypertrophy   . LEG CRAMPS 03/07/2009  . OTITIS EXTERNA 08/30/2008  . PROSTATITIS 08/30/2008  . Essential hypertension 08/26/2007  . ERECTILE DYSFUNCTION 08/26/2007    Past Surgical History:  Procedure Laterality Date  . FINGER SURGERY Right 04/19/2014   RIGHT  INDEX & RING FINGER   FROM DOG BITE   . I&D EXTREMITY Right 04/20/2014   Procedure: IRRIGATION AND DEBRIDEMENT right index finger and right ring finger;  Surgeon: Dominica Severin, MD;  Location: [facility];  Service: Orthopedics;  Laterality: Right;  . I&D EXTREMITY Right 04/21/2014   Procedure: IRRIGATION AND DEBRIDEMENT RIGHT HAND RING FINGER, INDEX FINGER AND Flexor Tenolysis and FDS Tenotomy;  Surgeon: Dominica Severin, MD;  Location: [facility] OR;  Service: Orthopedics;  Laterality: Right;       Home Medications    Prior to Admission medications   Medication Sig Start Date End Date Taking? Authorizing Provider  amLODipine-benazepril (LOTREL) 10-20 MG per capsule Take 1 capsule by mouth daily. 02/20/15 04/10/16 Yes Georgina Quint Plotnikov, MD  Cholecalciferol (VITAMIN D) 1000 UNITS capsule Take 1 capsule (1,000 Units total) by mouth daily. 09/09/12  Yes Georgina Quint Plotnikov, MD  OVER THE COUNTER MEDICATION Take 1 tablet by mouth every morning. "Sinus pill."   Yes Historical Provider, MD  potassium chloride (KLOR-CON) 8 MEQ tablet Take 1 tablet (8 MEQ total) by mouth daily. 08/22/15  Yes [name], MD  acetaminophen (TYLENOL) 500 MG tablet Take 2 tablets (1,000 mg total) by mouth every 8 (eight) hours. Do not take more than 4000 mg of acetaminophen (Tylenol) in a 24-hour period. Please note that other medicines that you may be prescribed may have Tylenol as well. 04/10/16 04/17/16  Nira Conn, MD  clonazePAM (KLONOPIN) 0.5 MG tablet  Take 1 tablet (0.5 mg total) by mouth at bedtime as needed for anxiety (sleep). Reported on 08/22/2015 Patient not taking: Reported on 04/10/2016 08/22/15   [name], MD  cyclobenzaprine (FLEXERIL) 10 MG tablet Take 1 tablet (10 mg total) by mouth at bedtime. 04/10/16 04/20/16  Nira Conn, MD  doxycycline (VIBRAMYCIN) 100 MG capsule Take 1 capsule (100 mg total) by mouth 2 (two) times daily. Patient not taking: Reported on 04/10/2016 03/05/16    Garlon Hatchet, PA-C  metroNIDAZOLE (FLAGYL) 500 MG tablet Take 1 tablet (500 mg total) by mouth 2 (two) times daily. Patient not taking: Reported on 04/10/2016 03/05/16   Garlon Hatchet, PA-C  naproxen (NAPROSYN) 375 MG tablet Take 1 tablet (375 mg total) by mouth 2 (two) times daily with a meal. 04/10/16 04/17/16  Nira Conn, MD    Family History Family History  Problem Relation Age of Onset  . Diabetes Mother   . Diabetes Other     Social History Social History  Substance Use Topics  . Smoking status: Former Smoker    Packs/day: 1.00    Years: 7.00    Types: Cigarettes, Cigars  . Smokeless tobacco: Never Used     Comment: cigars day/ 1-2       QIUIT SMOKING IN 2000  . Alcohol use Yes     Comment: OCCASSIONAL WINE -1 glass/month     Allergies   Atorvastatin; Penicillins; and Sulfonamide derivatives   Review of Systems Review of Systems  Constitutional: Negative for chills and fever.  HENT: Negative for ear pain and sore throat.   Eyes: Negative for pain and visual disturbance.  Respiratory: Negative for cough and shortness of breath.   Cardiovascular: Negative for chest pain and palpitations.  Gastrointestinal: Negative for abdominal pain, bowel incontinence and vomiting.  Genitourinary: Negative for bladder incontinence, dysuria, hematuria and pelvic pain.  Musculoskeletal: Positive for back pain. Negative for arthralgias.  Skin: Negative for color change and rash.  Neurological: Negative for seizures, syncope and paresthesias.  All other systems reviewed and are negative.    Physical Exam Updated Vital Signs BP 155/81 (BP Location: Right Arm)   Pulse 70   Temp 98 F (36.7 C) (Oral)   Resp 20   Ht 5\' 8"  (1.727 m)   Wt 203 lb 6 oz (92.3 kg)   SpO2 96%   BMI 30.92 kg/m   Physical Exam  Constitutional: He is oriented to person, place, and time. He appears well-developed and well-nourished. No distress.  HENT:  Head: Normocephalic and  atraumatic.  Nose: Nose normal.  Eyes: Conjunctivae and EOM are normal. Pupils are equal, round, and reactive to light. Right eye exhibits no discharge. Left eye exhibits no discharge. No scleral icterus.  Neck: Normal range of motion. Neck supple.  Cardiovascular: Normal rate and regular rhythm.  Exam reveals no gallop and no friction rub.   No murmur heard. Pulmonary/Chest: Effort normal and breath sounds normal. No stridor. No respiratory distress. He has no rales.  Abdominal: Soft. He exhibits no distension. There is no tenderness.  Musculoskeletal: He exhibits no edema or tenderness.       Back:  Neurological: He is alert and oriented to person, place, and time.  Spine Exam: Inspection/Palpation: TTP on left sacroiliac/lumbar paraspinal muscles Strength: 5/5 throughout LE bilaterally (hip flexion/extension, adduction/abduction; knee flexion/extension; foot dorsiflexion/plantarflexion, inversion/eversion; great toe inversion) Sensation: Intact to light touch in proximal and distal LE bilaterally Reflexes: 2+ quadriceps and achilles reflexes  Skin: Skin is warm and dry. No rash noted. He is not diaphoretic. No erythema.  Psychiatric: He has a normal mood and affect.  Vitals reviewed.    ED Treatments / Results  Labs (all labs ordered are listed, but only abnormal results are displayed) Labs Reviewed  URINALYSIS, ROUTINE W REFLEX MICROSCOPIC (NOT [facility]) - Abnormal; Notable for the following:       Result Value   Color, Urine AMBER (*)    APPearance CLOUDY (*)    Bilirubin Urine SMALL (*)    All other components within normal limits    EKG  EKG Interpretation None       Radiology Dg Lumbar Spine Complete  Result Date: 04/10/2016 CLINICAL DATA:  Lumbago, chronic EXAM: LUMBAR SPINE - COMPLETE 4+ VIEW COMPARISON:  None. FINDINGS: Frontal, lateral, spot lumbosacral lateral, and bilateral oblique views were obtained. There are 5 non-rib-bearing lumbar type vertebral  bodies. There is no fracture or spondylolisthesis. There is moderate disc space narrowing at L4-5 and L5-S1. The other disc spaces appear unremarkable. There are anterior osteophytes at all levels. There is facet osteoarthritic change at L3-4, L4-5, and L5-S1 on the right at L4-5 and L5-S1 on the left. There is atherosclerotic calcification in the aorta. IMPRESSION: Osteoarthritic changes several levels. No fracture or spondylolisthesis. There is aortic atherosclerosis. Electronically Signed   By: Bretta BangWilliam  Woodruff III M.D.   On: 04/10/2016 22:06    Procedures Procedures (including critical care time) Emergency Focused Ultrasound Exam Limited Retroperitoneal Ultrasound of the Abdominal Aorta.   Performed and interpreted by Dr. Eudelia Bunchardama Indication: abdominal pain Multiple views of the abdominal aorta are obtained from the diaphragmatic hiatus to the aortic bifurcation in transverse and sagittal planes with a multi-frequency probe.  Findings: largest dimensions ~ 2 x 2.5, no  dissection flap noted Interpretation: no abdominal aortic aneurysm, no dissection visualized, no signs of impending rupture Images archived electronically.  CPT Code: 1610976775   Medications Ordered in ED Medications  acetaminophen (TYLENOL) tablet 1,000 mg (1,000 mg Oral Given 04/10/16 2127)  methocarbamol (ROBAXIN) tablet 500 mg (500 mg Oral Given 04/10/16 2127)     Initial Impression / Assessment and Plan / ED Course  I have reviewed the triage vital signs and the nursing notes.  Pertinent labs & imaging results that were available during my care of the patient were reviewed by me and considered in my medical decision making (see chart for details).  Clinical Course    75 y.o. male presents with back pain in lumbar area for 3 days without signs of radicular pain. No acute traumatic onset. No red flag symptoms of fever, weight loss, saddle anesthesia, weakness, fecal/urinary incontinence or urinary retention.    Bedside ultrasound without evidence of AAA. Plain film without evidence of bony lesions or compression fractures.  Suspect MSK etiology. No indication for imaging emergently. Patient was recommended to take short course of scheduled NSAIDs and engage in early mobility as definitive treatment. Return precautions discussed for worsening or new concerning symptoms.    Final Clinical Impressions(s) / ED Diagnoses   Final diagnoses:  Strain of lumbar region, initial encounter   Disposition: Discharge  Condition: Good  I have discussed the results, Dx and Tx plan with the patient who expressed understanding and agree(s) with the plan. Discharge instructions discussed at great length. The patient was given strict return precautions who verbalized understanding of the instructions. No further questions at time of discharge.    Discharge Medication List as of 04/10/2016  10:39 PM    START taking these medications   Details  acetaminophen (TYLENOL) 500 MG tablet Take 2 tablets (1,000 mg total) by mouth every 8 (eight) hours. Do not take more than 4000 mg of acetaminophen (Tylenol) in a 24-hour period. Please note that other medicines that you may be prescribed may have Tylenol as well., Starting Wed 04/10/2016,  Until Wed 04/17/2016, Print    cyclobenzaprine (FLEXERIL) 10 MG tablet Take 1 tablet (10 mg total) by mouth at bedtime., Starting Wed 04/10/2016, Until Sat 04/20/2016, Print    naproxen (NAPROSYN) 375 MG tablet Take 1 tablet (375 mg total) by mouth 2 (two) times daily with a meal., Starting Wed 04/10/2016, Until Wed 04/17/2016, Print        Follow Up: [name], MD [address] [place] [address] [phone]  Schedule an appointment as soon as possible for a visit  in 5-7 days, If symptoms do not improve or  worsen      Nira Conn, MD 04/11/16 0134

## 2016-04-10 NOTE — ED Notes (Signed)
Patient transported to X-ray 

## 2016-04-10 NOTE — ED Triage Notes (Signed)
Patient states he has had intermittent low back/sacral pain for several weeks.  Earlier today as he tried to crank a weed eater, he had acute exacerbation of that pain.  Pain is not worse with palpation, but is much worse with movement.  Patient denies radiating pain or numbness/tingling.  Patient has remote history (40 years ago) of low back disc issues.  Patient denies urinary or fecal incontinence.  Patient states for a couple of months he has been urinating more than usual.  He has an appt next month with his PCP, but his urologist has retired.

## 2016-04-15 ENCOUNTER — Telehealth: Payer: Self-pay | Admitting: Internal Medicine

## 2016-04-15 NOTE — Telephone Encounter (Signed)
Patient Name: [name]  DOB: 03/17/1941    Initial Comment Caller says, husband went to the hospital ER for back pain, Dx with a pulled muscle, was advised to see his PC of he does not improve. He is worse than he was before. He is taking 3 Rx from the ER. He wants an appt today.    Nurse Assessment  Nurse: Scarlette ArStandifer, RN, Heather Date/Time (Eastern Time): 04/15/2016 9:38:11 AM  Confirm and document reason for call. If symptomatic, describe symptoms. You must click the next button to save text entered. ---Caller says, husband went to the hospital ER on Thursday afternoon for back pain, Dx with a pulled muscle, was advised to see his PC of he does not improve. He is worse than he was before. He is taking 3 Rx from the ER. He wants an appt today.  Has the patient traveled out of the country within the last 30 days? ---Not Applicable  Does the patient have any new or worsening symptoms? ---Yes  Will a triage be completed? ---Yes  Related visit to physician within the last 2 weeks? ---Yes  Does the PT have any chronic conditions? (i.e. diabetes, asthma, etc.) ---Yes  List chronic conditions. ---See MR  Is this a behavioral health or substance abuse call? ---No     Guidelines    Guideline Title Affirmed Question Affirmed Notes  Back Injury [1] SEVERE pain (e.g., excruciating) AND [2] not improved 2 hours after pain medicine/ice packs    Final Disposition User   See Physician within 4 Hours (or PCP triage) Scarlette ArStandifer, RN, Herbert SetaHeather    Comments  caller states that she does not want to have to take him back to the ED or [facility], he needs a FU appt with his PCP. She would like for someone to try to work him in to the office today.   Referrals  REFERRED TO PCP OFFICE   Disagree/Comply: Comply

## 2016-04-16 ENCOUNTER — Encounter: Payer: Self-pay | Admitting: Internal Medicine

## 2016-04-16 ENCOUNTER — Ambulatory Visit (INDEPENDENT_AMBULATORY_CARE_PROVIDER_SITE_OTHER): Payer: Medicare Other | Admitting: Internal Medicine

## 2016-04-16 DIAGNOSIS — R350 Frequency of micturition: Secondary | ICD-10-CM | POA: Diagnosis not present

## 2016-04-16 DIAGNOSIS — N401 Enlarged prostate with lower urinary tract symptoms: Secondary | ICD-10-CM | POA: Diagnosis not present

## 2016-04-16 LAB — GLUCOSE, POCT (MANUAL RESULT ENTRY): POC Glucose: 120 mg/dl — AB (ref 70–99)

## 2016-04-16 MED ORDER — TAMSULOSIN HCL 0.4 MG PO CAPS: 0.4000 mg | ORAL_CAPSULE | Freq: Every day | ORAL | 3 refills | Status: DC

## 2016-04-16 MED ORDER — FINASTERIDE 5 MG PO TABS: 5.0000 mg | ORAL_TABLET | Freq: Every day | ORAL | 3 refills | Status: DC

## 2016-04-16 NOTE — Assessment & Plan Note (Signed)
2017 recurrent

## 2016-04-16 NOTE — Progress Notes (Signed)
Pre visit review using our clinic review tool, if applicable. No additional management support is needed unless otherwise documented below in the visit note. 

## 2016-04-16 NOTE — Progress Notes (Signed)
Subjective:  Patient ID: [name], male    DOB: 09/18/1940  Age: 75 y.o. MRN: [id number]  CC: Nocturia   HPI Larry BackWoody M Stanforth presents for frequency; the pt went 6-7 times last night.[name].[name].He went to ER on 04/10/16 for LBP; UA was ok F/u HTN  Outpatient Medications Prior to Visit  Medication Sig Dispense Refill  . acetaminophen (TYLENOL) 500 MG tablet Take 2 tablets (1,000 mg total) by mouth every 8 (eight) hours. Do not take more than 4000 mg of acetaminophen (Tylenol) in a 24-hour period. Please note that other medicines that you may be prescribed may have Tylenol as well. 42 tablet 0  . Cholecalciferol (VITAMIN D) 1000 UNITS capsule Take 1 capsule (1,000 Units total) by mouth daily. 100 capsule 3  . clonazePAM (KLONOPIN) 0.5 MG tablet Take 1 tablet (0.5 mg total) by mouth at bedtime as needed for anxiety (sleep). Reported on 08/22/2015 90 tablet 1  . cyclobenzaprine (FLEXERIL) 10 MG tablet Take 1 tablet (10 mg total) by mouth at bedtime. 10 tablet 0  . naproxen (NAPROSYN) 375 MG tablet Take 1 tablet (375 mg total) by mouth 2 (two) times daily with a meal. 14 tablet 0  . OVER THE COUNTER MEDICATION Take 1 tablet by mouth every morning. "Sinus pill."    . potassium chloride (KLOR-CON) 8 MEQ tablet Take 1 tablet (8 MEQ total) by mouth daily. 30 tablet 11  . doxycycline (VIBRAMYCIN) 100 MG capsule Take 1 capsule (100 mg total) by mouth 2 (two) times daily. 20 capsule 0  . amLODipine-benazepril (LOTREL) 10-20 MG per capsule Take 1 capsule by mouth daily. 90 capsule 3  . metroNIDAZOLE (FLAGYL) 500 MG tablet Take 1 tablet (500 mg total) by mouth 2 (two) times daily. (Patient not taking: Reported on 04/16/2016) 20 tablet 0   No facility-administered medications prior to visit.     ROS Review of Systems  Constitutional: Negative for appetite change, fatigue and unexpected weight change.  HENT: Negative for congestion, nosebleeds, sneezing, sore throat and trouble swallowing.   Eyes: Negative  for itching and visual disturbance.  Respiratory: Negative for cough.   Cardiovascular: Negative for chest pain, palpitations and leg swelling.  Gastrointestinal: Negative for abdominal distention, blood in stool, diarrhea and nausea.  Genitourinary: Positive for frequency and urgency. Negative for decreased urine volume, flank pain and hematuria.  Musculoskeletal: Negative for back pain, gait problem, joint swelling and neck pain.  Skin: Negative for rash.  Neurological: Negative for dizziness, tremors, speech difficulty and weakness.  Psychiatric/Behavioral: Negative for agitation, dysphoric mood and sleep disturbance. The patient is not nervous/anxious.     Objective:  BP (!) 100/48   Pulse 69   Temp 98.3 F (36.8 C) (Oral)   Wt 205 lb (93 kg)   SpO2 96%   BMI 31.17 kg/m   BP Readings from Last 3 Encounters:  04/16/16 (!) 100/48  04/10/16 150/97  03/07/16 179/89    Wt Readings from Last 3 Encounters:  04/16/16 205 lb (93 kg)  04/10/16 203 lb 6 oz (92.3 kg)  03/07/16 207 lb (93.9 kg)    Physical Exam  Constitutional: He is oriented to person, place, and time. He appears well-developed. No distress.  NAD  HENT:  Mouth/Throat: Oropharynx is clear and moist.  Eyes: Conjunctivae are normal. Pupils are equal, round, and reactive to light.  Neck: Normal range of motion. No JVD present. No thyromegaly present.  Cardiovascular: Normal rate, regular rhythm, normal heart sounds and intact distal pulses.  Exam reveals no gallop and no friction rub.   No murmur heard. Pulmonary/Chest: Effort normal and breath sounds normal. No respiratory distress. He has no wheezes. He has no rales. He exhibits no tenderness.  Abdominal: Soft. Bowel sounds are normal. He exhibits no distension and no mass. There is no tenderness. There is no rebound and no guarding.  Genitourinary: Rectum normal. Rectal exam shows guaiac negative stool.  Musculoskeletal: Normal range of motion. He exhibits no  edema or tenderness.  Lymphadenopathy:    He has no cervical adenopathy.  Neurological: He is alert and oriented to person, place, and time. He has normal reflexes. No cranial nerve deficit. He exhibits normal muscle tone. He displays a negative Romberg sign. Coordination and gait normal.  Skin: Skin is warm and dry. No rash noted.  Psychiatric: He has a normal mood and affect. His behavior is normal. Judgment and thought content normal.  prostate 1+ L>R  Lab Results  Component Value Date   WBC 5.8 03/07/2016   HGB 14.9 03/07/2016   HCT 43.0 03/07/2016   PLT 168 03/07/2016   GLUCOSE 113 (H) 03/07/2016   CHOL 210 (H) 12/20/2015   TRIG 108.0 12/20/2015   HDL 36.60 (L) 12/20/2015   LDLDIRECT 115.9 09/08/2009   LDLCALC 152 (H) 12/20/2015   ALT 14 (L) 03/07/2016   AST 15 03/07/2016   NA 138 03/07/2016   K 3.6 03/07/2016   CL 104 03/07/2016   CREATININE 1.03 03/07/2016   BUN 14 03/07/2016   CO2 27 03/07/2016   TSH 2.19 12/20/2015   PSA 0.27 12/20/2015   HGBA1C 5.7 12/20/2015    Dg Lumbar Spine Complete  Result Date: 04/10/2016 CLINICAL DATA:  Lumbago, chronic EXAM: LUMBAR SPINE - COMPLETE 4+ VIEW COMPARISON:  None. FINDINGS: Frontal, lateral, spot lumbosacral lateral, and bilateral oblique views were obtained. There are 5 non-rib-bearing lumbar type vertebral bodies. There is no fracture or spondylolisthesis. There is moderate disc space narrowing at L4-5 and L5-S1. The other disc spaces appear unremarkable. There are anterior osteophytes at all levels. There is facet osteoarthritic change at L3-4, L4-5, and L5-S1 on the right at L4-5 and L5-S1 on the left. There is atherosclerotic calcification in the aorta. IMPRESSION: Osteoarthritic changes several levels. No fracture or spondylolisthesis. There is aortic atherosclerosis. Electronically Signed   By: Bretta Bang III M.D.   On: 04/10/2016 22:06    Assessment & Plan:   There are no diagnoses linked to this encounter. I have  discontinued Mr. Eddings metroNIDAZOLE and doxycycline. I am also having him maintain his Vitamin D, amLODipine-benazepril, clonazePAM, potassium chloride, OVER THE COUNTER MEDICATION, naproxen, cyclobenzaprine, and acetaminophen.  No orders of the defined types were placed in this encounter.    Follow-up: No Follow-up on file.  Sonda Primes, MD

## 2016-04-16 NOTE — Assessment & Plan Note (Signed)
Start Proscar and Flomax

## 2016-04-16 NOTE — Addendum Note (Signed)
Addended by: Merrilyn PumaSIMMONS, Jasmane Brockway N on: 04/16/2016 03:36 PM   Modules accepted: Orders

## 2016-04-28 ENCOUNTER — Other Ambulatory Visit: Payer: Self-pay | Admitting: Internal Medicine

## 2016-05-27 DIAGNOSIS — N400 Enlarged prostate without lower urinary tract symptoms: Secondary | ICD-10-CM | POA: Diagnosis not present

## 2016-06-10 ENCOUNTER — Encounter: Payer: Self-pay | Admitting: Internal Medicine

## 2016-06-10 ENCOUNTER — Other Ambulatory Visit (INDEPENDENT_AMBULATORY_CARE_PROVIDER_SITE_OTHER): Payer: Medicare Other

## 2016-06-10 ENCOUNTER — Ambulatory Visit (INDEPENDENT_AMBULATORY_CARE_PROVIDER_SITE_OTHER): Payer: Medicare Other | Admitting: Internal Medicine

## 2016-06-10 DIAGNOSIS — E782 Mixed hyperlipidemia: Secondary | ICD-10-CM | POA: Diagnosis not present

## 2016-06-10 DIAGNOSIS — H02401 Unspecified ptosis of right eyelid: Secondary | ICD-10-CM | POA: Diagnosis not present

## 2016-06-10 DIAGNOSIS — I10 Essential (primary) hypertension: Secondary | ICD-10-CM

## 2016-06-10 DIAGNOSIS — H9201 Otalgia, right ear: Secondary | ICD-10-CM | POA: Insufficient documentation

## 2016-06-10 DIAGNOSIS — R350 Frequency of micturition: Secondary | ICD-10-CM

## 2016-06-10 DIAGNOSIS — N401 Enlarged prostate with lower urinary tract symptoms: Secondary | ICD-10-CM

## 2016-06-10 LAB — BASIC METABOLIC PANEL WITH GFR
BUN: 12 mg/dL (ref 6–23)
CO2: 29 meq/L (ref 19–32)
Calcium: 9.4 mg/dL (ref 8.4–10.5)
Chloride: 101 meq/L (ref 96–112)
Creatinine, Ser: 0.99 mg/dL (ref 0.40–1.50)
GFR: 94.39 mL/min (ref >60.00)
Glucose, Bld: 103 mg/dL — ABNORMAL HIGH (ref 70–99)
Potassium: 4.2 meq/L (ref 3.5–5.1)
Sodium: 138 meq/L (ref 135–145)

## 2016-06-10 NOTE — Assessment & Plan Note (Signed)
Skin irritation from glasses frame - see optometry to make adjustments

## 2016-06-10 NOTE — Progress Notes (Signed)
Subjective:  Patient ID: [name], male    DOB: 02/03/1941  Age: 75 y.o. MRN: [id number]  CC: No chief complaint on file.   HPI Larry BackWoody M Noffke presents for HTN, BPH, sleep disorder. C/o R ear pressure, irritation x 1-2 weeks  Outpatient Medications Prior to Visit  Medication Sig Dispense Refill  . amLODipine-benazepril (LOTREL) 10-20 MG capsule TAKE 1 CAPSULE BY MOUTH DAILY. 90 capsule 1  . Cholecalciferol (VITAMIN D) 1000 UNITS capsule Take 1 capsule (1,000 Units total) by mouth daily. 100 capsule 3  . clonazePAM (KLONOPIN) 0.5 MG tablet Take 1 tablet (0.5 mg total) by mouth at bedtime as needed for anxiety (sleep). Reported on 08/22/2015 90 tablet 1  . finasteride (PROSCAR) 5 MG tablet Take 1 tablet (5 mg total) by mouth daily. For the prostate 90 tablet 3  . OVER THE COUNTER MEDICATION Take 1 tablet by mouth every morning. "Sinus pill."    . potassium chloride (KLOR-CON) 8 MEQ tablet Take 1 tablet (8 MEQ total) by mouth daily. 30 tablet 11  . tamsulosin (FLOMAX) 0.4 MG CAPS capsule Take 1 capsule (0.4 mg total) by mouth daily. For the prostate 90 capsule 3   No facility-administered medications prior to visit.     ROS Review of Systems  Constitutional: Negative for appetite change, fatigue and unexpected weight change.  HENT: Positive for ear pain. Negative for congestion, nosebleeds, sneezing, sore throat and trouble swallowing.   Eyes: Negative for itching and visual disturbance.  Respiratory: Negative for cough.   Cardiovascular: Negative for chest pain, palpitations and leg swelling.  Gastrointestinal: Negative for abdominal distention, blood in stool, diarrhea and nausea.  Genitourinary: Negative for frequency and hematuria.  Musculoskeletal: Negative for back pain, gait problem, joint swelling and neck pain.  Skin: Negative for rash.  Neurological: Negative for dizziness, tremors, speech difficulty and weakness.  Psychiatric/Behavioral: Negative for agitation,  dysphoric mood, sleep disturbance and suicidal ideas. The patient is not nervous/anxious.     Objective:  BP 110/60   Pulse 62   Wt 207 lb (93.9 kg)   SpO2 97%   BMI 31.47 kg/m   BP Readings from Last 3 Encounters:  06/10/16 110/60  04/16/16 (!) 100/48  04/10/16 150/97    Wt Readings from Last 3 Encounters:  06/10/16 207 lb (93.9 kg)  04/16/16 205 lb (93 kg)  04/10/16 203 lb 6 oz (92.3 kg)    Physical Exam  Constitutional: He is oriented to person, place, and time. He appears well-developed. No distress.  NAD  HENT:  Mouth/Throat: Oropharynx is clear and moist.  Eyes: Conjunctivae are normal. Pupils are equal, round, and reactive to light.  Neck: Normal range of motion. No JVD present. No thyromegaly present.  Cardiovascular: Normal rate, regular rhythm, normal heart sounds and intact distal pulses.  Exam reveals no gallop and no friction rub.   No murmur heard. Pulmonary/Chest: Effort normal and breath sounds normal. No respiratory distress. He has no wheezes. He has no rales. He exhibits no tenderness.  Abdominal: Soft. Bowel sounds are normal. He exhibits no distension and no mass. There is no tenderness. There is no rebound and no guarding.  Musculoskeletal: Normal range of motion. He exhibits no edema or tenderness.  Lymphadenopathy:    He has no cervical adenopathy.  Neurological: He is alert and oriented to person, place, and time. He has normal reflexes. No cranial nerve deficit. He exhibits normal muscle tone. He displays a negative Romberg sign. Coordination and gait normal.  Skin: Skin is warm and dry. No rash noted.  Psychiatric: He has a normal mood and affect. His behavior is normal. Judgment and thought content normal.  skin depression under glasses frame; no lesions  Lab Results  Component Value Date   WBC 5.8 03/07/2016   HGB 14.9 03/07/2016   HCT 43.0 03/07/2016   PLT 168 03/07/2016   GLUCOSE 113 (H) 03/07/2016   CHOL 210 (H) 12/20/2015   TRIG 108.0  12/20/2015   HDL 36.60 (L) 12/20/2015   LDLDIRECT 115.9 09/08/2009   LDLCALC 152 (H) 12/20/2015   ALT 14 (L) 03/07/2016   AST 15 03/07/2016   NA 138 03/07/2016   K 3.6 03/07/2016   CL 104 03/07/2016   CREATININE 1.03 03/07/2016   BUN 14 03/07/2016   CO2 27 03/07/2016   TSH 2.19 12/20/2015   PSA 0.27 12/20/2015   HGBA1C 5.7 12/20/2015    Dg Lumbar Spine Complete  Result Date: 04/10/2016 CLINICAL DATA:  Lumbago, chronic EXAM: LUMBAR SPINE - COMPLETE 4+ VIEW COMPARISON:  None. FINDINGS: Frontal, lateral, spot lumbosacral lateral, and bilateral oblique views were obtained. There are 5 non-rib-bearing lumbar type vertebral bodies. There is no fracture or spondylolisthesis. There is moderate disc space narrowing at L4-5 and L5-S1. The other disc spaces appear unremarkable. There are anterior osteophytes at all levels. There is facet osteoarthritic change at L3-4, L4-5, and L5-S1 on the right at L4-5 and L5-S1 on the left. There is atherosclerotic calcification in the aorta. IMPRESSION: Osteoarthritic changes several levels. No fracture or spondylolisthesis. There is aortic atherosclerosis. Electronically Signed   By: Bretta BangWilliam  Woodruff III M.D.   On: 04/10/2016 22:06    Assessment & Plan:   There are no diagnoses linked to this encounter. I am having Mr. Jimmey Ralpharker maintain his Vitamin D, clonazePAM, potassium chloride, OVER THE COUNTER MEDICATION, finasteride, tamsulosin, and amLODipine-benazepril.  No orders of the defined types were placed in this encounter.    Follow-up: No Follow-up on file.  Sonda PrimesAlex Plotnikov, MD

## 2016-06-10 NOTE — Assessment & Plan Note (Signed)
Lotrel

## 2016-06-10 NOTE — Assessment & Plan Note (Signed)
Declined lipid lowering meds

## 2016-06-10 NOTE — Assessment & Plan Note (Signed)
Surgery pending

## 2016-06-10 NOTE — Assessment & Plan Note (Signed)
Proscar, Flomax 

## 2016-06-10 NOTE — Progress Notes (Signed)
Pre visit review using our clinic review tool, if applicable. No additional management support is needed unless otherwise documented below in the visit note. 

## 2016-07-13 ENCOUNTER — Emergency Department (HOSPITAL_COMMUNITY)
Admission: EM | Admit: 2016-07-13 | Discharge: 2016-07-14 | Disposition: A | Discharge status: Home / Self Care | Payer: Medicare Other | Attending: Emergency Medicine | Admitting: Emergency Medicine

## 2016-07-13 ENCOUNTER — Encounter (HOSPITAL_COMMUNITY): Payer: Self-pay

## 2016-07-13 ENCOUNTER — Emergency Department (HOSPITAL_COMMUNITY): Payer: Medicare Other

## 2016-07-13 DIAGNOSIS — R1084 Generalized abdominal pain: Secondary | ICD-10-CM | POA: Diagnosis not present

## 2016-07-13 DIAGNOSIS — I10 Essential (primary) hypertension: Secondary | ICD-10-CM | POA: Insufficient documentation

## 2016-07-13 DIAGNOSIS — R1010 Upper abdominal pain, unspecified: Secondary | ICD-10-CM | POA: Diagnosis present

## 2016-07-13 DIAGNOSIS — Z79899 Other long term (current) drug therapy: Secondary | ICD-10-CM | POA: Insufficient documentation

## 2016-07-13 DIAGNOSIS — Z87891 Personal history of nicotine dependence: Secondary | ICD-10-CM | POA: Insufficient documentation

## 2016-07-13 DIAGNOSIS — R109 Unspecified abdominal pain: Secondary | ICD-10-CM | POA: Diagnosis not present

## 2016-07-13 LAB — TROPONIN I: Troponin I: <0.03 ng/mL (ref <0.03)

## 2016-07-13 LAB — URINALYSIS, ROUTINE W REFLEX MICROSCOPIC
Bilirubin Urine: NEGATIVE
Glucose, UA: NEGATIVE mg/dL
Hgb urine dipstick: NEGATIVE
KETONES UR: NEGATIVE mg/dL
Nitrite: POSITIVE — AB
PROTEIN: NEGATIVE mg/dL
Specific Gravity, Urine: 1.024 (ref 1.005–1.030)
pH: 5 (ref 5.0–8.0)

## 2016-07-13 LAB — HEPATIC FUNCTION PANEL
ALBUMIN: 4.4 g/dL (ref 3.5–5.0)
ALK PHOS: 119 U/L (ref 38–126)
ALT: 34 U/L (ref 17–63)
AST: 38 U/L (ref 15–41)
BILIRUBIN TOTAL: 1.3 mg/dL — AB (ref 0.3–1.2)
Bilirubin, Direct: 0.2 mg/dL (ref 0.1–0.5)
Indirect Bilirubin: 1.1 mg/dL — ABNORMAL HIGH (ref 0.3–0.9)
Total Protein: 6.9 g/dL (ref 6.5–8.1)

## 2016-07-13 LAB — BASIC METABOLIC PANEL
ANION GAP: 9 (ref 5–15)
BUN: 13 mg/dL (ref 6–20)
CALCIUM: 8.8 mg/dL — AB (ref 8.9–10.3)
CO2: 26 mmol/L (ref 22–32)
Chloride: 103 mmol/L (ref 101–111)
Creatinine, Ser: 1.07 mg/dL (ref 0.61–1.24)
GFR calc Af Amer: >60 mL/min (ref >60)
GFR calc non Af Amer: >60 mL/min (ref >60)
GLUCOSE: 138 mg/dL — AB (ref 65–99)
POTASSIUM: 3.8 mmol/L (ref 3.5–5.1)
Sodium: 138 mmol/L (ref 135–145)

## 2016-07-13 LAB — CBC
HEMATOCRIT: 41.9 % (ref 39.0–52.0)
HEMOGLOBIN: 14.9 g/dL (ref 13.0–17.0)
MCH: 30 pg (ref 26.0–34.0)
MCHC: 35.6 g/dL (ref 30.0–36.0)
MCV: 84.3 fL (ref 78.0–100.0)
Platelets: 203 10*3/uL (ref 150–400)
RBC: 4.97 MIL/uL (ref 4.22–5.81)
RDW: 12.5 % (ref 11.5–15.5)
WBC: 8.1 10*3/uL (ref 4.0–10.5)

## 2016-07-13 LAB — I-STAT TROPONIN, ED: Troponin i, poc: 0 ng/mL (ref 0.00–0.08)

## 2016-07-13 NOTE — ED Notes (Signed)
Patient taken to xray.

## 2016-07-13 NOTE — ED Notes (Signed)
Pt unable to urinate, urinal by bedside. 

## 2016-07-13 NOTE — ED Notes (Signed)
Patient has only been able to urinate 15ml and he is refusing to have a I&O cath performed. He stated he would leave the hospital before allowing this.

## 2016-07-13 NOTE — ED Provider Notes (Signed)
[facility]-EMERGENCY DEPT Provider Note   CSN: [id number] Arrival date & time: 07/13/16  1442     History   Chief Complaint Chief Complaint  Patient presents with  . Abdominal Pain    HPI [name] is a 76 y.o. male.  Pt presents to the ED today with severe upper abdominal pain.  The pt said it was a pressure sensation.  He felt like he was going to pass out.  The wife said he was diaphoretic.  The pt said that sx are gone now after an extended wait in the waiting room.  He said he is ready to go now.      Past Medical History:  Diagnosis Date  . Benign prostatic hypertrophy   . Hyperlipidemia   . Hypertension   . Insomnia   . Parasomnia 10/14/2011   PSG 12/15/11>>AHI 0.4, SpO2 low 89%, PLMI 16, somnoliloquay, nocturia   . Seasonal allergies   . UTI (lower urinary tract infection)     Patient Active Problem List   Diagnosis Date Noted  . Ear pain, referred, right 06/10/2016  . Urinary frequency 04/16/2016  . Cerumen impaction 08/22/2015  . Infection of right hand due to bite 04/20/2014  . Well adult exam 07/16/2013  . Ptosis 11/04/2011  . Twitching 11/04/2011  . Non-REM Parasomnia 10/14/2011  . Dog bite of left hand 08/19/2011  . Wound infection 08/19/2011  . Cellulitis and abscess of hand, except fingers and thumb 08/19/2011  . Hypokalemia 12/10/2010  . Hyperglycemia 12/10/2010  . Hyperlipidemia   . Benign prostatic hyperplasia   . LEG CRAMPS 03/07/2009  . OTITIS EXTERNA 08/30/2008  . Prostatitis 08/30/2008  . Essential hypertension 08/26/2007  . ERECTILE DYSFUNCTION 08/26/2007    Past Surgical History:  Procedure Laterality Date  . FINGER SURGERY Right 04/19/2014   RIGHT INDEX & RING FINGER   FROM DOG BITE   . I&D EXTREMITY Right 04/20/2014   Procedure: IRRIGATION AND DEBRIDEMENT right index finger and right ring finger;  Surgeon: Dominica SeverinWilliam Gramig, MD;  [facility];  Service: Orthopedics;  Laterality: Right;  . I&D EXTREMITY Right 04/21/2014   Procedure: IRRIGATION AND DEBRIDEMENT RIGHT HAND RING FINGER, INDEX FINGER AND Flexor Tenolysis and FDS Tenotomy;  Surgeon: Dominica SeverinWilliam Gramig, MD;  Location: [facility] OR;  Service: Orthopedics;  Laterality: Right;       Home Medications    Prior to Admission medications   Medication Sig Start Date End Date Taking? Authorizing Provider  amLODipine-benazepril (LOTREL) 10-20 MG capsule TAKE 1 CAPSULE BY MOUTH DAILY. 04/29/16  Yes Aleksei Plotnikov V, MD  Cholecalciferol (VITAMIN D) 1000 UNITS capsule Take 1 capsule (1,000 Units total) by mouth daily. 09/09/12  Yes Aleksei Plotnikov V, MD  finasteride (PROSCAR) 5 MG tablet Take 1 tablet (5 mg total) by mouth daily. For the prostate 04/16/16 04/16/17 Yes Aleksei Plotnikov V, MD  potassium chloride (KLOR-CON) 8 MEQ tablet Take 1 tablet (8 MEQ total) by mouth daily. 08/22/15  Yes Aleksei Plotnikov V, MD  tamsulosin (FLOMAX) 0.4 MG CAPS capsule Take 1 capsule (0.4 mg total) by mouth daily. For the prostate 04/16/16  Yes Aleksei Plotnikov V, MD  clonazePAM (KLONOPIN) 0.5 MG tablet Take 1 tablet (0.5 mg total) by mouth at bedtime as needed for anxiety (sleep). Reported on 08/22/2015 Patient not taking: Reported on 07/13/2016 08/22/15   Tresa GarterAleksei Plotnikov V, MD    Family History Family History  Problem Relation Age of Onset  . Diabetes Mother   . Diabetes Other  Social History Social History  Substance Use Topics  . Smoking status: Former Smoker    Packs/day: 1.00    Years: 7.00    Types: Cigarettes, Cigars  . Smokeless tobacco: Never Used     Comment: cigars day/ 1-2       QIUIT SMOKING IN 2000  . Alcohol use Yes     Comment: OCCASSIONAL WINE -1 glass/month     Allergies   Atorvastatin; Penicillins; and Sulfonamide derivatives   Review of Systems Review of Systems  Gastrointestinal: Positive for abdominal pain.  All other systems reviewed and are negative.    Physical Exam Updated Vital Signs BP 179/95 (BP Location: Left Arm)   Pulse  83   Temp 97.6 F (36.4 C) (Oral)   Resp 18   SpO2 96%   Physical Exam  Constitutional: He is oriented to person, place, and time. He appears well-developed and well-nourished.  HENT:  Head: Normocephalic and atraumatic.  Right Ear: External ear normal.  Left Ear: External ear normal.  Nose: Nose normal.  Mouth/Throat: Oropharynx is clear and moist.  Eyes: Conjunctivae and EOM are normal. Pupils are equal, round, and reactive to light.  Neck: Normal range of motion. Neck supple.  Cardiovascular: Normal rate, regular rhythm, normal heart sounds and intact distal pulses.   Pulmonary/Chest: Effort normal and breath sounds normal.  Abdominal: Soft. Bowel sounds are normal.  Musculoskeletal: Normal range of motion.  Neurological: He is alert and oriented to person, place, and time.  Skin: Skin is warm.  Psychiatric: He has a normal mood and affect. His behavior is normal. Judgment and thought content normal.  Nursing note and vitals reviewed.    ED Treatments / Results  Labs (all labs ordered are listed, but only abnormal results are displayed) Labs Reviewed  BASIC METABOLIC PANEL - Abnormal; Notable for the following:       Result Value   Glucose, Bld 138 (*)    Calcium 8.8 (*)    All other components within normal limits  HEPATIC FUNCTION PANEL - Abnormal; Notable for the following:    Total Bilirubin 1.3 (*)    Indirect Bilirubin 1.1 (*)    All other components within normal limits  CBC  TROPONIN I  URINALYSIS, ROUTINE W REFLEX MICROSCOPIC  I-STAT TROPOININ, ED    EKG  EKG Interpretation  Date/Time:  Saturday July 13 2016 15:06:30 EST Ventricular Rate:  64 PR Interval:    QRS Duration: 95 QT Interval:  409 QTC Calculation: 422 R Axis:   -14 Text Interpretation:  Sinus rhythm Abnormal R-wave progression, early transition agree. no old comparison Confirmed by Donnald Garre, MD, Lebron Conners [phone]) on 07/13/2016 4:17:31 PM       Radiology Dg Chest 2 View  Result  Date: 07/13/2016 CLINICAL DATA:  Abdominal pain EXAM: CHEST  2 VIEW COMPARISON:  04/22/2011 FINDINGS: Normal heart size. There is no pleural effusion identified. Chronic scar identified within the left lung base. Normal appearance of the right lung. IMPRESSION: 1. Left base scarring. 2. No acute findings Electronically Signed   By: Signa Kell M.D.   On: 07/13/2016 15:52    Procedures Procedures (including critical care time)  Medications Ordered in ED Medications - No data to display   Initial Impression / Assessment and Plan / ED Course  I have reviewed the triage vital signs and the nursing notes.  Pertinent labs & imaging results that were available during my care of the patient were reviewed by me and considered in my  medical decision making (see chart for details).    Pt has continued to be asymptomatic.  He is unable to urinate and does not want to wait any more for him to produce one.  The pt knows to return if worse and to f/u with his pcp.   Final Clinical Impressions(s) / ED Diagnoses   Final diagnoses:  Generalized abdominal pain    New Prescriptions New Prescriptions   No medications on file     Jacalyn Lefevre, MD 07/13/16 2253

## 2016-07-13 NOTE — ED Notes (Signed)
Spoke with MD about patient, advised to complete chest pain protocol.  Patient kept on monitor in triage.

## 2016-07-13 NOTE — ED Triage Notes (Signed)
Patient c/o of upper epigastric pain that began about 1pm today.  Patient states that has never had a pain like this before.  Patient states that he was driving when the pain began.  Patient with a HX of HTN.  Patient is restless in triage.  Patient denies chest pain, denies SOB, denies N/V.

## 2016-10-14 ENCOUNTER — Other Ambulatory Visit: Payer: Self-pay | Admitting: Internal Medicine

## 2016-10-21 ENCOUNTER — Other Ambulatory Visit: Payer: Self-pay | Admitting: Internal Medicine

## 2016-10-26 ENCOUNTER — Other Ambulatory Visit: Payer: Self-pay | Admitting: Internal Medicine

## 2016-12-30 ENCOUNTER — Encounter: Payer: Self-pay | Admitting: Internal Medicine

## 2016-12-30 ENCOUNTER — Ambulatory Visit (INDEPENDENT_AMBULATORY_CARE_PROVIDER_SITE_OTHER): Payer: Medicare Other | Admitting: Internal Medicine

## 2016-12-30 ENCOUNTER — Other Ambulatory Visit: Payer: Self-pay | Admitting: Internal Medicine

## 2016-12-30 ENCOUNTER — Other Ambulatory Visit (INDEPENDENT_AMBULATORY_CARE_PROVIDER_SITE_OTHER): Payer: Medicare Other

## 2016-12-30 VITALS — BP 144/72 | HR 53 | Temp 98.0°F | Ht 68.0 in | Wt 208.0 lb

## 2016-12-30 DIAGNOSIS — I10 Essential (primary) hypertension: Secondary | ICD-10-CM | POA: Diagnosis not present

## 2016-12-30 DIAGNOSIS — Z0000 Encounter for general adult medical examination without abnormal findings: Secondary | ICD-10-CM | POA: Diagnosis not present

## 2016-12-30 DIAGNOSIS — E782 Mixed hyperlipidemia: Secondary | ICD-10-CM

## 2016-12-30 DIAGNOSIS — R7989 Other specified abnormal findings of blood chemistry: Secondary | ICD-10-CM | POA: Diagnosis not present

## 2016-12-30 DIAGNOSIS — H02401 Unspecified ptosis of right eyelid: Secondary | ICD-10-CM

## 2016-12-30 DIAGNOSIS — R739 Hyperglycemia, unspecified: Secondary | ICD-10-CM | POA: Diagnosis not present

## 2016-12-30 LAB — URINALYSIS, ROUTINE W REFLEX MICROSCOPIC
HGB URINE DIPSTICK: NEGATIVE
KETONES UR: NEGATIVE
NITRITE: POSITIVE — AB
Specific Gravity, Urine: >=1.03 — AB (ref 1.000–1.030)
URINE GLUCOSE: NEGATIVE
Urobilinogen, UA: 1 (ref 0.0–1.0)
pH: 6 (ref 5.0–8.0)

## 2016-12-30 LAB — CBC WITH DIFFERENTIAL/PLATELET
Basophils Absolute: 0 10*3/uL (ref 0.0–0.1)
Basophils Relative: 1 % (ref 0.0–3.0)
EOS PCT: 2.4 % (ref 0.0–5.0)
Eosinophils Absolute: 0.1 10*3/uL (ref 0.0–0.7)
HCT: 43.8 % (ref 39.0–52.0)
HEMOGLOBIN: 15.6 g/dL (ref 13.0–17.0)
Lymphocytes Relative: 42.7 % (ref 12.0–46.0)
Lymphs Abs: 1.7 10*3/uL (ref 0.7–4.0)
MCHC: 35.6 g/dL (ref 30.0–36.0)
MCV: 84.6 fl (ref 78.0–100.0)
MONO ABS: 0.5 10*3/uL (ref 0.1–1.0)
MONOS PCT: 12 % (ref 3.0–12.0)
Neutro Abs: 1.7 10*3/uL (ref 1.4–7.7)
Neutrophils Relative %: 41.9 % — ABNORMAL LOW (ref 43.0–77.0)
Platelets: 183 10*3/uL (ref 150.0–400.0)
RBC: 5.17 Mil/uL (ref 4.22–5.81)
RDW: 13.3 % (ref 11.5–15.5)
WBC: 4.1 10*3/uL (ref 4.0–10.5)

## 2016-12-30 LAB — LDL CHOLESTEROL, DIRECT: LDL DIRECT: 114 mg/dL

## 2016-12-30 LAB — LIPID PANEL
Cholesterol: 183 mg/dL (ref 0–200)
HDL: 33.2 mg/dL — ABNORMAL LOW (ref >39.00)
NONHDL: 149.79
Total CHOL/HDL Ratio: 6
Triglycerides: 228 mg/dL — ABNORMAL HIGH (ref 0.0–149.0)
VLDL: 45.6 mg/dL — AB (ref 0.0–40.0)

## 2016-12-30 LAB — HEPATIC FUNCTION PANEL
ALBUMIN: 3.9 g/dL (ref 3.5–5.2)
ALK PHOS: 111 U/L (ref 39–117)
ALT: 11 U/L (ref 0–53)
AST: 12 U/L (ref 0–37)
Bilirubin, Direct: 0.2 mg/dL (ref 0.0–0.3)
Total Bilirubin: 1 mg/dL (ref 0.2–1.2)
Total Protein: 6.5 g/dL (ref 6.0–8.3)

## 2016-12-30 LAB — MICROALBUMIN / CREATININE URINE RATIO
Creatinine,U: 301.5 mg/dL
MICROALB/CREAT RATIO: 1 mg/g (ref 0.0–30.0)
Microalb, Ur: 2.9 mg/dL — ABNORMAL HIGH (ref 0.0–1.9)

## 2016-12-30 LAB — BASIC METABOLIC PANEL
BUN: 11 mg/dL (ref 6–23)
CO2: 31 mEq/L (ref 19–32)
Calcium: 9.2 mg/dL (ref 8.4–10.5)
Chloride: 103 mEq/L (ref 96–112)
Creatinine, Ser: 0.93 mg/dL (ref 0.40–1.50)
GFR: 101.3 mL/min (ref >60.00)
Glucose, Bld: 94 mg/dL (ref 70–99)
POTASSIUM: 3.9 meq/L (ref 3.5–5.1)
Sodium: 139 mEq/L (ref 135–145)

## 2016-12-30 LAB — HEMOGLOBIN A1C: HEMOGLOBIN A1C: 5.7 % (ref 4.6–6.5)

## 2016-12-30 LAB — PSA: PSA: 0.42 ng/mL (ref 0.10–4.00)

## 2016-12-30 LAB — TSH: TSH: 2.08 u[IU]/mL (ref 0.35–4.50)

## 2016-12-30 MED ORDER — CIPROFLOXACIN HCL 500 MG PO TABS: 500.0000 mg | ORAL_TABLET | Freq: Two times a day (BID) | ORAL | 0 refills | Status: AC

## 2016-12-30 NOTE — Progress Notes (Signed)
Subjective:  Patient ID: [name], male    DOB: 01/29/1941  Age: 76 y.o. MRN: [id number]  CC: No chief complaint on file.   HPI [name] [facility]  Outpatient Medications Prior to Visit  Medication Sig Dispense Refill  . amLODipine-benazepril (LOTREL) 10-20 MG capsule Take 1 capsule by mouth daily. Annual appt due in June must make appt for future refills 90 capsule 0  . Cholecalciferol (VITAMIN D) 1000 UNITS capsule Take 1 capsule (1,000 Units total) by mouth daily. 100 capsule 3  . potassium chloride (KLOR-CON) 8 MEQ tablet Take 1 tablet (8 mEq total) by mouth daily. Yearly physical w/labs due in June must see MD for refills 90 tablet 0  . clonazePAM (KLONOPIN) 0.5 MG tablet Take 1 tablet (0.5 mg total) by mouth at bedtime as needed for anxiety (sleep). Reported on 08/22/2015 (Patient not taking: Reported on 07/13/2016) 90 tablet 1  . finasteride (PROSCAR) 5 MG tablet Take 1 tablet (5 mg total) by mouth daily. For the prostate 90 tablet 3  . potassium chloride (KLOR-CON) 8 MEQ tablet Take 1 tablet (8 mEq total) by mouth daily. Yearly physical w/labs due in June must see MD for refills 90 tablet 0  . tamsulosin (FLOMAX) 0.4 MG CAPS capsule Take 1 capsule (0.4 mg total) by mouth daily. For the prostate 90 capsule 3   No facility-administered medications prior to visit.     ROS Review of Systems  Constitutional: Negative for appetite change, fatigue and unexpected weight change.  HENT: Negative for congestion, nosebleeds, sneezing, sore throat and trouble swallowing.   Eyes: Negative for itching and visual disturbance.  Respiratory: Negative for cough.   Cardiovascular: Negative for chest pain, palpitations and leg swelling.  Gastrointestinal: Negative for abdominal distention, blood in stool, diarrhea and nausea.  Genitourinary: Negative for frequency and hematuria.  Musculoskeletal: Negative for back pain, gait problem, joint swelling and neck pain.  Skin:  Negative for rash.  Neurological: Negative for dizziness, tremors, speech difficulty and weakness.  Psychiatric/Behavioral: Negative for agitation, dysphoric mood, sleep disturbance and suicidal ideas. The patient is not nervous/anxious.     Objective:  BP (!) 144/72 (BP Location: Left Arm, Patient Position: Sitting, Cuff Size: Large)   Pulse (!) 53   Temp 98 F (36.7 C) (Oral)   Ht 5\' 8"  (1.727 m)   Wt 208 lb (94.3 kg)   SpO2 98%   BMI 31.63 kg/m   BP Readings from Last 3 Encounters:  12/30/16 (!) 144/72  07/13/16 163/95  06/10/16 110/60    Wt Readings from Last 3 Encounters:  12/30/16 208 lb (94.3 kg)  06/10/16 207 lb (93.9 kg)  04/16/16 205 lb (93 kg)    Physical Exam  Constitutional: He is oriented to person, place, and time. He appears well-developed. No distress.  NAD  HENT:  Mouth/Throat: Oropharynx is clear and moist.  Eyes: Conjunctivae are normal. Pupils are equal, round, and reactive to light.  Neck: Normal range of motion. No JVD present. No thyromegaly present.  Cardiovascular: Normal rate, regular rhythm, normal heart sounds and intact distal pulses.  Exam reveals no gallop and no friction rub.   No murmur heard. Pulmonary/Chest: Effort normal and breath sounds normal. No respiratory distress. He has no wheezes. He has no rales. He exhibits no tenderness.  Abdominal: Soft. Bowel sounds are normal. He exhibits no distension and no mass. There is no tenderness. There is no rebound and no guarding.  Genitourinary: Rectal  exam shows guaiac positive stool.  Musculoskeletal: Normal range of motion. He exhibits no edema or tenderness.  Lymphadenopathy:    He has no cervical adenopathy.  Neurological: He is alert and oriented to person, place, and time. He has normal reflexes. No cranial nerve deficit. He exhibits normal muscle tone. He displays a negative Romberg sign. Coordination and gait normal.  Skin: Skin is warm and dry. No rash noted.  Psychiatric: He has a  normal mood and affect. His behavior is normal. Judgment and thought content normal.  prostate 1+ droopy eyelids R>L  Lab Results  Component Value Date   WBC 8.1 07/13/2016   HGB 14.9 07/13/2016   HCT 41.9 07/13/2016   PLT 203 07/13/2016   GLUCOSE 138 (H) 07/13/2016   CHOL 210 (H) 12/20/2015   TRIG 108.0 12/20/2015   HDL 36.60 (L) 12/20/2015   LDLDIRECT 115.9 09/08/2009   LDLCALC 152 (H) 12/20/2015   ALT 34 07/13/2016   AST 38 07/13/2016   NA 138 07/13/2016   K 3.8 07/13/2016   CL 103 07/13/2016   CREATININE 1.07 07/13/2016   BUN 13 07/13/2016   CO2 26 07/13/2016   TSH 2.19 12/20/2015   PSA 0.27 12/20/2015   HGBA1C 5.7 12/20/2015    Dg Chest 2 View  Result Date: 07/13/2016 CLINICAL DATA:  Abdominal pain EXAM: CHEST  2 VIEW COMPARISON:  04/22/2011 FINDINGS: Normal heart size. There is no pleural effusion identified. Chronic scar identified within the left lung base. Normal appearance of the right lung. IMPRESSION: 1. Left base scarring. 2. No acute findings Electronically Signed   By: Signa Kell M.D.   On: 07/13/2016 15:52    Assessment & Plan:   There are no diagnoses linked to this encounter. I have discontinued [name] clonazePAM, finasteride, and tamsulosin. I am also having him maintain his Vitamin D, potassium chloride, and amLODipine-benazepril.  No orders of the defined types were placed in this encounter.    Follow-up: No Follow-up on file.  Sonda Primes, MD

## 2016-12-30 NOTE — Patient Instructions (Signed)

## 2016-12-30 NOTE — Assessment & Plan Note (Signed)
Declined statins Labs 

## 2016-12-30 NOTE — Assessment & Plan Note (Signed)
Lotrel Labs 

## 2016-12-30 NOTE — Assessment & Plan Note (Addendum)
Here for medicare wellness/physical  Diet: heart healthy  Physical activity: not sedentary  Depression/mood screen: negative  Hearing: intact to whispered voice  Visual acuity: grossly normal, performs annual eye exam; droopy eyelids R>L ADLs: capable  Fall risk: low to none  Home safety: good  Cognitive evaluation: intact to orientation, naming, recall and repetition  EOL planning: adv directives, full code/ I agree  I have personally reviewed and have noted  1. The patient's medical, surgical and social history  2. Their use of alcohol, tobacco or illicit drugs  3. Their current medications and supplements  4. The patient's functional ability including ADL's, fall risks, home safety risks and hearing or visual impairment.  5. Diet and physical activities  6. Evidence for depression or mood disorders 7. The roster of all physicians providing medical care to patient - is listed in the Snapshot section of the chart and reviewed today.    Today patient counseled on age appropriate routine health concerns for screening and prevention, each reviewed and up to date or declined. Immunizations reviewed and up to date or declined. Labs ordered and reviewed. Risk factors for depression reviewed and negative. Hearing function and visual acuity are intact. ADLs screened and addressed as needed. Functional ability and level of safety reviewed and appropriate. Education, counseling and referrals performed based on assessed risks today. Patient provided with a copy of personalized plan for preventive services.  Declined zostavax

## 2016-12-30 NOTE — Assessment & Plan Note (Signed)
Reschedule surgery

## 2016-12-30 NOTE — Assessment & Plan Note (Signed)
A1c

## 2016-12-31 ENCOUNTER — Other Ambulatory Visit: Payer: Medicare Other

## 2016-12-31 DIAGNOSIS — R8290 Unspecified abnormal findings in urine: Secondary | ICD-10-CM | POA: Diagnosis not present

## 2017-01-03 LAB — URINE CULTURE

## 2017-02-03 ENCOUNTER — Other Ambulatory Visit: Payer: Self-pay | Admitting: Internal Medicine

## 2017-02-04 ENCOUNTER — Other Ambulatory Visit: Payer: Self-pay | Admitting: General Practice

## 2017-02-04 MED ORDER — AMLODIPINE BESY-BENAZEPRIL HCL 10-20 MG PO CAPS: 1.0000 | ORAL_CAPSULE | Freq: Every day | ORAL | 3 refills | Status: DC

## 2017-04-08 ENCOUNTER — Other Ambulatory Visit: Payer: Self-pay | Admitting: Internal Medicine

## 2017-04-09 NOTE — Telephone Encounter (Signed)
This was discontinued by Plot at last visit, declined.

## 2017-04-09 NOTE — Telephone Encounter (Signed)
Routing to dr crawford, please advise in the absence of dr plotnikov, thanks 

## 2017-04-19 ENCOUNTER — Other Ambulatory Visit: Payer: Self-pay | Admitting: Internal Medicine

## 2017-04-22 ENCOUNTER — Other Ambulatory Visit: Payer: Self-pay | Admitting: Internal Medicine

## 2017-06-30 ENCOUNTER — Ambulatory Visit: Payer: Self-pay

## 2017-06-30 ENCOUNTER — Encounter: Payer: Self-pay | Admitting: Internal Medicine

## 2017-06-30 ENCOUNTER — Ambulatory Visit (INDEPENDENT_AMBULATORY_CARE_PROVIDER_SITE_OTHER): Payer: Medicare Other | Admitting: Internal Medicine

## 2017-06-30 ENCOUNTER — Other Ambulatory Visit (INDEPENDENT_AMBULATORY_CARE_PROVIDER_SITE_OTHER): Payer: Medicare Other

## 2017-06-30 DIAGNOSIS — I872 Venous insufficiency (chronic) (peripheral): Secondary | ICD-10-CM

## 2017-06-30 DIAGNOSIS — I10 Essential (primary) hypertension: Secondary | ICD-10-CM

## 2017-06-30 DIAGNOSIS — L02419 Cutaneous abscess of limb, unspecified: Secondary | ICD-10-CM

## 2017-06-30 DIAGNOSIS — L03119 Cellulitis of unspecified part of limb: Secondary | ICD-10-CM

## 2017-06-30 LAB — URINALYSIS
Bilirubin Urine: NEGATIVE
HGB URINE DIPSTICK: NEGATIVE
Ketones, ur: NEGATIVE
Leukocytes, UA: NEGATIVE
Nitrite: NEGATIVE
PH: 6.5 (ref 5.0–8.0)
Specific Gravity, Urine: 1.025 (ref 1.000–1.030)
TOTAL PROTEIN, URINE-UPE24: NEGATIVE
URINE GLUCOSE: NEGATIVE
Urobilinogen, UA: 2 — AB (ref 0.0–1.0)

## 2017-06-30 LAB — TSH: TSH: 3.04 u[IU]/mL (ref 0.35–4.50)

## 2017-06-30 LAB — CBC WITH DIFFERENTIAL/PLATELET
BASOS PCT: 0.9 % (ref 0.0–3.0)
Basophils Absolute: 0 10*3/uL (ref 0.0–0.1)
EOS PCT: 3.3 % (ref 0.0–5.0)
Eosinophils Absolute: 0.1 10*3/uL (ref 0.0–0.7)
HCT: 45.6 % (ref 39.0–52.0)
Hemoglobin: 15.4 g/dL (ref 13.0–17.0)
LYMPHS ABS: 1.8 10*3/uL (ref 0.7–4.0)
Lymphocytes Relative: 42.2 % (ref 12.0–46.0)
MCHC: 33.9 g/dL (ref 30.0–36.0)
MCV: 89.5 fl (ref 78.0–100.0)
MONOS PCT: 11.8 % (ref 3.0–12.0)
Monocytes Absolute: 0.5 10*3/uL (ref 0.1–1.0)
NEUTROS ABS: 1.8 10*3/uL (ref 1.4–7.7)
Neutrophils Relative %: 41.8 % — ABNORMAL LOW (ref 43.0–77.0)
PLATELETS: 219 10*3/uL (ref 150.0–400.0)
RBC: 5.09 Mil/uL (ref 4.22–5.81)
RDW: 13.2 % (ref 11.5–15.5)
WBC: 4.3 10*3/uL (ref 4.0–10.5)

## 2017-06-30 MED ORDER — DOXYCYCLINE HYCLATE 100 MG PO TABS
100.0000 mg | ORAL_TABLET | Freq: Two times a day (BID) | ORAL | 0 refills | Status: DC
Start: 2017-06-30 — End: 2017-07-29

## 2017-06-30 MED ORDER — VALSARTAN-HYDROCHLOROTHIAZIDE 320-25 MG PO TABS
1.0000 | ORAL_TABLET | Freq: Every day | ORAL | 3 refills | Status: DC
Start: 2017-06-30 — End: 2017-10-27

## 2017-06-30 MED ORDER — B COMPLEX PO TABS: 1.0000 | ORAL_TABLET | Freq: Every day | ORAL | 3 refills | Status: DC

## 2017-06-30 NOTE — Assessment & Plan Note (Signed)
R>L 1/19 Compr socks Elevate legs

## 2017-06-30 NOTE — Assessment & Plan Note (Signed)
Doxy

## 2017-06-30 NOTE — Telephone Encounter (Signed)
Pt's wife calling for pt who is with wife. Wife states pt is having right foot to the knee with swelling and feels "tight". She states there is bruising or discoloration. The left foot on the side has slight swelling along with area of purplish bruise like discoloration to the side of the foot. She states this has improved in color.  The left ankle also has slight edema. Wife states the left foot feels "slightly cooler." Pt has been using icey hot for the pain. Protocol advised that pt see his PCP within 3 days. Pt has an appt for Wednesday but wants to be seen today. Called PCP office and spoke to Tammy who scheduled an appt for 1545 today with PCP. Care advice given. Reason for Disposition . [1] MILD swelling of both ankles (i.e., pedal edema) AND [2] new onset or worsening  Answer Assessment - Initial Assessment Questions 1. ONSET: "When did the swelling start?" (e.g., minutes, hours, days)     Few days ago 2. LOCATION: "What part of the leg is swollen?"  "Are both legs swollen or just one leg?"     Bilateral legs right>left  3. SEVERITY: "How bad is the swelling?" (e.g., localized; mild, moderate, severe)  - Localized - small area of swelling localized to one leg  - MILD pedal edema - swelling limited to foot and ankle, pitting edema < 1/4 inch (6 mm) deep, rest and elevation eliminate most or all swelling  - MODERATE edema - swelling of lower leg to knee, pitting edema > 1/4 inch (6 mm) deep, rest and elevation only partially reduce swelling  - SEVERE edema - swelling extends above knee, facial or hand swelling present      Moderate right >left  4. REDNESS: "Does the swelling look red or infected?"      Right shin red and infected 5. PAIN: "Is the swelling painful to touch?" If so, ask: "How painful is it?"   (Scale 1-10; mild, moderate or severe)     0 6. FEVER: "Do you have a fever?" If so, ask: "What is it, how was it measured, and when did it start?"      no 7. CAUSE: "What do you think  is causing the leg swelling?"     "I have no idea" Maybe with the fall off the ladder 8. MEDICAL HISTORY: "Do you have a history of heart failure, kidney disease, liver failure, or cancer?"     no 9. RECURRENT SYMPTOM: "Have you had leg swelling before?" If so, ask: "When was the last time?" "What happened that time?"     no 10. OTHER SYMPTOMS: "Do you have any other symptoms?" (e.g., chest pain, difficulty breathing)       no 11. PREGNANCY: "Is there any chance you are pregnant?" "When was your last menstrual period?"       n/a  Protocols used: LEG SWELLING AND EDEMA-A-AH

## 2017-06-30 NOTE — Patient Instructions (Addendum)
Compression socks - on in am and off at bedtime Elevate feet Cut back on salt    Chronic Venous Insufficiency Chronic venous insufficiency, also called venous stasis, is a condition that prevents blood from being pumped effectively through the veins in your legs. Blood may no longer be pumped effectively from the legs back to the heart. This condition can range from mild to severe. With proper treatment, you should be able to continue with an active life. What are the causes? Chronic venous insufficiency occurs when the vein walls become stretched, weakened, or damaged, or when valves within the vein are damaged. Some common causes of this include:  High blood pressure inside the veins (venous hypertension).  Increased blood pressure in the leg veins from long periods of sitting or standing.  A blood clot that blocks blood flow in a vein (deep vein thrombosis, DVT).  Inflammation of a vein (phlebitis) that causes a blood clot to form.  Tumors in the pelvis that cause blood to back up.  What increases the risk? The following factors may make you more likely to develop this condition:  Having a family history of this condition.  Obesity.  Pregnancy.  Living without enough physical activity or exercise (sedentary lifestyle).  Smoking.  Having a job that requires long periods of standing or sitting in one place.  Being a certain age. Women in their 70s and 25s and men in their 50s are more likely to develop this condition.  What are the signs or symptoms? Symptoms of this condition include:  Veins that are enlarged, bulging, or twisted (varicose veins).  Skin breakdown or ulcers.  Reddened or discolored skin on the front of the leg.  Brown, smooth, tight, and painful skin just above the ankle, usually on the inside of the leg (lipodermatosclerosis).  Swelling.  How is this diagnosed? This condition may be diagnosed based on:  Your medical history.  A physical  exam.  Tests, such as: ? A procedure that creates an image of a blood vessel and nearby organs and provides information about blood flow through the blood vessel (duplex ultrasound). ? A procedure that tests blood flow (plethysmography). ? A procedure to look at the veins using X-ray and dye (venogram).  How is this treated? The goals of treatment are to help you return to an active life and to minimize pain or disability. Treatment depends on the severity of your condition, and it may include:  Wearing compression stockings. These can help relieve symptoms and help prevent your condition from getting worse. However, they do not cure the condition.  Sclerotherapy. This is a procedure involving an injection of a material that "dissolves" damaged veins.  Surgery. This may involve: ? Removing a diseased vein (vein stripping). ? Cutting off blood flow through the vein (laser ablation surgery). ? Repairing a valve.  Follow these instructions at home:  Wear compression stockings as told by your health care provider. These stockings help to prevent blood clots and reduce swelling in your legs.  Take over-the-counter and prescription medicines only as told by your health care provider.  Stay active by exercising, walking, or doing different activities. Ask your health care provider what activities are safe for you and how much exercise you need.  Drink enough fluid to keep your urine clear or pale yellow.  Do not use any products that contain nicotine or tobacco, such as cigarettes and e-cigarettes. If you need help quitting, ask your health care provider.  Keep all follow-up  visits as told by your health care provider. This is important. Contact a health care provider if:  You have redness, swelling, or more pain in the affected area.  You see a red streak or line that extends up or down from the affected area.  You have skin breakdown or a loss of skin in the affected area, even if  the breakdown is small.  You get an injury in the affected area. Get help right away if:  You get an injury and an open wound in the affected area.  You have severe pain that does not get better with medicine.  You have sudden numbness or weakness in the foot or ankle below the affected area, or you have trouble moving your foot or ankle.  You have a fever and you have worse or persistent symptoms.  You have chest pain.  You have shortness of breath. Summary  Chronic venous insufficiency, also called venous stasis, is a condition that prevents blood from being pumped effectively through the veins in your legs.  Chronic venous insufficiency occurs when the vein walls become stretched, weakened, or damaged, or when valves within the vein are damaged.  Treatment for this condition depends on how severe your condition is, and it may involve wearing compression stockings or having a procedure.  Make sure you stay active by exercising, walking, or doing different activities. Ask your health care provider what activities are safe for you and how much exercise you need. This information is not intended to replace advice given to you by your health care provider. Make sure you discuss any questions you have with your health care provider. Document Released: 10/14/2006 Document Revised: 04/29/2016 Document Reviewed: 04/29/2016 Elsevier Interactive Patient Education  2017 [employer]

## 2017-06-30 NOTE — Progress Notes (Signed)
Subjective:  Patient ID: [name], male    DOB: 04/16/1941  Age: 77 y.o. MRN: [id number]  CC: No chief complaint on file.   HPI Larry BackWoody M Erhard presents for B leg swelling R>L x weeks A ladder hit L lower leg 3 wks ago - wound and rednes  Outpatient Medications Prior to Visit  Medication Sig Dispense Refill  . amLODipine-benazepril (LOTREL) 10-20 MG capsule Take 1 capsule by mouth daily. 90 capsule 3  . Cholecalciferol (VITAMIN D) 1000 UNITS capsule Take 1 capsule (1,000 Units total) by mouth daily. 100 capsule 3  . KLOR-CON 8 MEQ tablet TAKE 1 TABLET BY MOUTH DAILY*PHYSICAL WITH LABS DUE FOR REFILLS* 90 tablet 2  . potassium chloride (KLOR-CON) 8 MEQ tablet Take 1 tablet (8 mEq total) by mouth daily. 90 tablet 2   No facility-administered medications prior to visit.     ROS Review of Systems  Constitutional: Negative for appetite change, fatigue and unexpected weight change.  HENT: Negative for congestion, nosebleeds, sneezing, sore throat and trouble swallowing.   Eyes: Negative for itching and visual disturbance.  Respiratory: Negative for cough and shortness of breath.   Cardiovascular: Positive for leg swelling. Negative for chest pain and palpitations.  Gastrointestinal: Negative for abdominal distention, blood in stool, diarrhea and nausea.  Genitourinary: Negative for frequency and hematuria.  Musculoskeletal: Negative for back pain, gait problem, joint swelling and neck pain.  Skin: Positive for color change, rash and wound.  Neurological: Negative for dizziness, tremors, speech difficulty and weakness.  Psychiatric/Behavioral: Negative for agitation, dysphoric mood and sleep disturbance. The patient is not nervous/anxious.     Objective:  BP (!) 146/78 (BP Location: Right Arm, Patient Position: Sitting, Cuff Size: Large)   Pulse 64   Temp 97.8 F (36.6 C) (Oral)   Ht 5\' 8"  (1.727 m)   Wt 210 lb (95.3 kg)   SpO2 98%   BMI 31.93 kg/m   BP Readings from Last  3 Encounters:  06/30/17 (!) 146/78  12/30/16 (!) 144/72  07/13/16 163/95    Wt Readings from Last 3 Encounters:  06/30/17 210 lb (95.3 kg)  12/30/16 208 lb (94.3 kg)  06/10/16 207 lb (93.9 kg)    Physical Exam  Constitutional: He is oriented to person, place, and time. He appears well-developed. No distress.  NAD  HENT:  Mouth/Throat: Oropharynx is clear and moist.  Eyes: Conjunctivae are normal. Pupils are equal, round, and reactive to light.  Neck: Normal range of motion. No JVD present. No thyromegaly present.  Cardiovascular: Normal rate, regular rhythm, normal heart sounds and intact distal pulses. Exam reveals no gallop and no friction rub.  No murmur heard. Pulmonary/Chest: Effort normal and breath sounds normal. No respiratory distress. He has no wheezes. He has no rales. He exhibits no tenderness.  Abdominal: Soft. Bowel sounds are normal. He exhibits no distension and no mass. There is no tenderness. There is no rebound and no guarding.  Musculoskeletal: Normal range of motion. He exhibits edema. He exhibits no tenderness.  Lymphadenopathy:    He has no cervical adenopathy.  Neurological: He is alert and oriented to person, place, and time. He has normal reflexes. No cranial nerve deficit. He exhibits normal muscle tone. He displays a negative Romberg sign. Coordination and gait normal.  Skin: Skin is warm and dry. No rash noted. There is erythema.  Psychiatric: He has a normal mood and affect. His behavior is normal. Judgment and thought content normal.   RLE 2+ induration;  1 cm ulcer, hyperpigmentation LLE 1+ edema  Lab Results  Component Value Date   WBC 4.1 12/30/2016   HGB 15.6 12/30/2016   HCT 43.8 12/30/2016   PLT 183.0 12/30/2016   GLUCOSE 94 12/30/2016   CHOL 183 12/30/2016   TRIG 228.0 (H) 12/30/2016   HDL 33.20 (L) 12/30/2016   LDLDIRECT 114.0 12/30/2016   LDLCALC 152 (H) 12/20/2015   ALT 11 12/30/2016   AST 12 12/30/2016   NA 139 12/30/2016   K  3.9 12/30/2016   CL 103 12/30/2016   CREATININE 0.93 12/30/2016   BUN 11 12/30/2016   CO2 31 12/30/2016   TSH 2.08 12/30/2016   PSA 0.42 12/30/2016   HGBA1C 5.7 12/30/2016   MICROALBUR 2.9 (H) 12/30/2016    Dg Chest 2 View  Result Date: 07/13/2016 CLINICAL DATA:  Abdominal pain EXAM: CHEST  2 VIEW COMPARISON:  04/22/2011 FINDINGS: Normal heart size. There is no pleural effusion identified. Chronic scar identified within the left lung base. Normal appearance of the right lung. IMPRESSION: 1. Left base scarring. 2. No acute findings Electronically Signed   By: Signa Kell M.D.   On: 07/13/2016 15:52    Assessment & Plan:   There are no diagnoses linked to this encounter. I am having Loel Lofty. [place] maintain his Vitamin D, amLODipine-benazepril, potassium chloride, and KLOR-CON.  No orders of the defined types were placed in this encounter.    Follow-up: No Follow-up on file.  Sonda Primes, MD

## 2017-06-30 NOTE — Assessment & Plan Note (Addendum)
Lotrel - d/c due to swelling Diovan HCT 1/19 NAS diet

## 2017-07-01 ENCOUNTER — Telehealth: Payer: Self-pay | Admitting: Internal Medicine

## 2017-07-01 ENCOUNTER — Other Ambulatory Visit: Payer: Self-pay | Admitting: Internal Medicine

## 2017-07-01 ENCOUNTER — Ambulatory Visit (HOSPITAL_COMMUNITY)
Admission: RE | Admit: 2017-07-01 | Discharge: 2017-07-01 | Disposition: A | Discharge status: Home / Self Care | Payer: Medicare Other | Source: Ambulatory Visit | Attending: Vascular Surgery | Admitting: Vascular Surgery

## 2017-07-01 DIAGNOSIS — R600 Localized edema: Secondary | ICD-10-CM

## 2017-07-01 LAB — BASIC METABOLIC PANEL
BUN: 11 mg/dL (ref 6–23)
CALCIUM: 9.3 mg/dL (ref 8.4–10.5)
CO2: 27 mEq/L (ref 19–32)
CREATININE: 1.06 mg/dL (ref 0.40–1.50)
Chloride: 102 mEq/L (ref 96–112)
GFR: 86.99 mL/min (ref >60.00)
Glucose, Bld: 131 mg/dL — ABNORMAL HIGH (ref 70–99)
Potassium: 4.4 mEq/L (ref 3.5–5.1)
Sodium: 139 mEq/L (ref 135–145)

## 2017-07-01 LAB — D-DIMER, QUANTITATIVE (NOT AT ARMC): D DIMER QUANT: 1.32 ug{FEU}/mL — AB (ref <0.50)

## 2017-07-01 NOTE — Telephone Encounter (Signed)
rec'd call from [facility]; reported negative venous doppler results.

## 2017-07-01 NOTE — Telephone Encounter (Signed)
Called Pharmacist, communitylow Coordinator at [facility] [place];  Advised of negative venous doppler.  [facility].

## 2017-07-02 ENCOUNTER — Ambulatory Visit: Payer: Medicare Other | Admitting: Internal Medicine

## 2017-07-02 NOTE — Telephone Encounter (Signed)
Pt.notified

## 2017-07-02 NOTE — Telephone Encounter (Signed)
Ok to stop Xarelto Thx

## 2017-07-02 NOTE — Telephone Encounter (Signed)
Negative venous doppler, do you want pt to still take Xarelto?

## 2017-07-29 ENCOUNTER — Encounter: Payer: Self-pay | Admitting: Internal Medicine

## 2017-07-29 ENCOUNTER — Ambulatory Visit (INDEPENDENT_AMBULATORY_CARE_PROVIDER_SITE_OTHER): Payer: Medicare Other | Admitting: Internal Medicine

## 2017-07-29 DIAGNOSIS — I10 Essential (primary) hypertension: Secondary | ICD-10-CM

## 2017-07-29 DIAGNOSIS — L089 Local infection of the skin and subcutaneous tissue, unspecified: Secondary | ICD-10-CM

## 2017-07-29 DIAGNOSIS — R600 Localized edema: Secondary | ICD-10-CM | POA: Diagnosis not present

## 2017-07-29 DIAGNOSIS — T148XXA Other injury of unspecified body region, initial encounter: Secondary | ICD-10-CM

## 2017-07-29 DIAGNOSIS — I872 Venous insufficiency (chronic) (peripheral): Secondary | ICD-10-CM

## 2017-07-29 DIAGNOSIS — H9201 Otalgia, right ear: Secondary | ICD-10-CM

## 2017-07-29 DIAGNOSIS — R609 Edema, unspecified: Secondary | ICD-10-CM | POA: Insufficient documentation

## 2017-07-29 MED ORDER — FUROSEMIDE 40 MG PO TABS: 40.0000 mg | ORAL_TABLET | Freq: Every day | ORAL | 3 refills | Status: DC | PRN

## 2017-07-29 NOTE — Assessment & Plan Note (Signed)
Furosemide prn added

## 2017-07-29 NOTE — Assessment & Plan Note (Signed)
Diovan-HCT 

## 2017-07-29 NOTE — Assessment & Plan Note (Signed)
Eustachian tube dysfunction discussed

## 2017-07-29 NOTE — Assessment & Plan Note (Signed)
Resolved on Doxy

## 2017-07-29 NOTE — Assessment & Plan Note (Signed)
Compr socks Leg elevation

## 2017-07-29 NOTE — Progress Notes (Signed)
Subjective:  Patient ID: [name], male    DOB: 03/18/1941  Age: 77 y.o. MRN: [id number]  CC: No chief complaint on file.   HPI [name] presents for B LE swelling R>>L, better some C/o ear congestion  Outpatient Medications Prior to Visit  Medication Sig Dispense Refill  . b complex vitamins tablet Take 1 tablet by mouth daily. 100 tablet 3  . Cholecalciferol (VITAMIN D) 1000 UNITS capsule Take 1 capsule (1,000 Units total) by mouth daily. 100 capsule 3  . doxycycline (VIBRA-TABS) 100 MG tablet Take 1 tablet (100 mg total) by mouth 2 (two) times daily. 20 tablet 0  . potassium chloride (KLOR-CON) 8 MEQ tablet Take 1 tablet (8 mEq total) by mouth daily. 90 tablet 2  . valsartan-hydrochlorothiazide (DIOVAN HCT) 320-25 MG tablet Take 1 tablet by mouth daily. 90 tablet 3   No facility-administered medications prior to visit.     ROS Review of Systems  Constitutional: Negative for appetite change, fatigue and unexpected weight change.  HENT: Negative for congestion, nosebleeds, sneezing, sore throat and trouble swallowing.   Eyes: Negative for itching and visual disturbance.  Respiratory: Negative for cough.   Cardiovascular: Positive for leg swelling. Negative for chest pain and palpitations.  Gastrointestinal: Negative for abdominal distention, blood in stool, diarrhea and nausea.  Genitourinary: Negative for frequency and hematuria.  Musculoskeletal: Negative for back pain, gait problem, joint swelling and neck pain.  Skin: Negative for rash.  Neurological: Negative for dizziness, tremors, speech difficulty and weakness.  Psychiatric/Behavioral: Negative for agitation, dysphoric mood and sleep disturbance. The patient is not nervous/anxious.     Objective:  BP 134/72 (BP Location: Left Arm, Patient Position: Sitting, Cuff Size: Large)   Pulse (!) 57   Temp 98 F (36.7 C) (Oral)   Ht 5\' 8"  (1.727 m)   Wt 207 lb (93.9 kg)   SpO2 98%   BMI 31.47 kg/m   BP  Readings from Last 3 Encounters:  07/29/17 134/72  06/30/17 (!) 146/78  12/30/16 (!) 144/72    Wt Readings from Last 3 Encounters:  07/29/17 207 lb (93.9 kg)  06/30/17 210 lb (95.3 kg)  12/30/16 208 lb (94.3 kg)    Physical Exam  Constitutional: He is oriented to person, place, and time. He appears well-developed. No distress.  NAD  HENT:  Mouth/Throat: Oropharynx is clear and moist.  Eyes: Conjunctivae are normal. Pupils are equal, round, and reactive to light.  Neck: Normal range of motion. No JVD present. No thyromegaly present.  Cardiovascular: Normal rate, regular rhythm, normal heart sounds and intact distal pulses. Exam reveals no gallop and no friction rub.  No murmur heard. Pulmonary/Chest: Effort normal and breath sounds normal. No respiratory distress. He has no wheezes. He has no rales. He exhibits no tenderness.  Abdominal: Soft. Bowel sounds are normal. He exhibits no distension and no mass. There is no tenderness. There is no rebound and no guarding.  Musculoskeletal: Normal range of motion. He exhibits edema. He exhibits no tenderness.  Lymphadenopathy:    He has no cervical adenopathy.  Neurological: He is alert and oriented to person, place, and time. He has normal reflexes. No cranial nerve deficit. He exhibits normal muscle tone. He displays a negative Romberg sign. Coordination and gait normal.  Skin: Skin is warm and dry. No rash noted.  Psychiatric: He has a normal mood and affect. His behavior is normal. Judgment and thought content normal.  RLE 1+, LLE trace Firm edema R,  skin discolored  Lab Results  Component Value Date   WBC 4.3 06/30/2017   HGB 15.4 06/30/2017   HCT 45.6 06/30/2017   PLT 219.0 06/30/2017   GLUCOSE 131 (H) 06/30/2017   CHOL 183 12/30/2016   TRIG 228.0 (H) 12/30/2016   HDL 33.20 (L) 12/30/2016   LDLDIRECT 114.0 12/30/2016   LDLCALC 152 (H) 12/20/2015   ALT 11 12/30/2016   AST 12 12/30/2016   NA 139 06/30/2017   K 4.4  06/30/2017   CL 102 06/30/2017   CREATININE 1.06 06/30/2017   BUN 11 06/30/2017   CO2 27 06/30/2017   TSH 3.04 06/30/2017   PSA 0.42 12/30/2016   HGBA1C 5.7 12/30/2016   MICROALBUR 2.9 (H) 12/30/2016    No results found.  Assessment & Plan:   There are no diagnoses linked to this encounter. I am having [name] maintain his Vitamin D, potassium chloride, valsartan-hydrochlorothiazide, doxycycline, and b complex vitamins.  No orders of the defined types were placed in this encounter.    Follow-up: No Follow-up on file.  Sonda Primes, MD

## 2017-10-27 ENCOUNTER — Encounter: Payer: Self-pay | Admitting: Internal Medicine

## 2017-10-27 ENCOUNTER — Other Ambulatory Visit (INDEPENDENT_AMBULATORY_CARE_PROVIDER_SITE_OTHER): Payer: Medicare Other

## 2017-10-27 ENCOUNTER — Ambulatory Visit (INDEPENDENT_AMBULATORY_CARE_PROVIDER_SITE_OTHER): Payer: Medicare Other | Admitting: Internal Medicine

## 2017-10-27 VITALS — BP 136/84 | HR 53 | Temp 97.8°F | Ht 68.0 in | Wt 206.0 lb

## 2017-10-27 DIAGNOSIS — E782 Mixed hyperlipidemia: Secondary | ICD-10-CM

## 2017-10-27 DIAGNOSIS — R739 Hyperglycemia, unspecified: Secondary | ICD-10-CM

## 2017-10-27 DIAGNOSIS — I10 Essential (primary) hypertension: Secondary | ICD-10-CM | POA: Diagnosis not present

## 2017-10-27 DIAGNOSIS — R5383 Other fatigue: Secondary | ICD-10-CM | POA: Insufficient documentation

## 2017-10-27 DIAGNOSIS — E876 Hypokalemia: Secondary | ICD-10-CM | POA: Diagnosis not present

## 2017-10-27 DIAGNOSIS — R600 Localized edema: Secondary | ICD-10-CM | POA: Diagnosis not present

## 2017-10-27 LAB — BASIC METABOLIC PANEL
BUN: 18 mg/dL (ref 6–23)
CHLORIDE: 100 meq/L (ref 96–112)
CO2: 33 mEq/L — ABNORMAL HIGH (ref 19–32)
CREATININE: 1.32 mg/dL (ref 0.40–1.50)
Calcium: 9.6 mg/dL (ref 8.4–10.5)
GFR: 67.48 mL/min (ref >60.00)
Glucose, Bld: 86 mg/dL (ref 70–99)
POTASSIUM: 4.5 meq/L (ref 3.5–5.1)
Sodium: 140 mEq/L (ref 135–145)

## 2017-10-27 LAB — HEMOGLOBIN A1C: HEMOGLOBIN A1C: 5.9 % (ref 4.6–6.5)

## 2017-10-27 MED ORDER — TELMISARTAN 40 MG PO TABS: 40.0000 mg | ORAL_TABLET | Freq: Every day | ORAL | 3 refills | Status: DC

## 2017-10-27 MED ORDER — AMLODIPINE BESYLATE 5 MG PO TABS: 5.0000 mg | ORAL_TABLET | Freq: Every day | ORAL | 3 refills | Status: DC

## 2017-10-27 NOTE — Assessment & Plan Note (Signed)
Diovan HCT  d/c due to fatigue  Start Micardis, Norvasc

## 2017-10-27 NOTE — Assessment & Plan Note (Signed)
Diovan HCT  d/c due to fatigue

## 2017-10-27 NOTE — Progress Notes (Signed)
Subjective:  Patient ID: [name], male    DOB: 1940-12-08  Age: 77 y.o. MRN: [id number]  CC: No chief complaint on file.   HPI JEHAD BISONO presents for fatigue after taking Diovan HCT F/u HTN  Outpatient Medications Prior to Visit  Medication Sig Dispense Refill  . b complex vitamins tablet Take 1 tablet by mouth daily. 100 tablet 3  . Cholecalciferol (VITAMIN D) 1000 UNITS capsule Take 1 capsule (1,000 Units total) by mouth daily. 100 capsule 3  . furosemide (LASIX) 40 MG tablet Take 1 tablet (40 mg total) by mouth daily as needed for edema (swelling). 30 tablet 3  . potassium chloride (KLOR-CON) 8 MEQ tablet Take 1 tablet (8 mEq total) by mouth daily. 90 tablet 2  . valsartan-hydrochlorothiazide (DIOVAN HCT) 320-25 MG tablet Take 1 tablet by mouth daily. 90 tablet 3   No facility-administered medications prior to visit.     ROS Review of Systems  Constitutional: Positive for fatigue. Negative for appetite change and unexpected weight change.  HENT: Negative for congestion, nosebleeds, sneezing, sore throat and trouble swallowing.   Eyes: Negative for itching and visual disturbance.  Respiratory: Negative for cough.   Cardiovascular: Negative for chest pain, palpitations and leg swelling.  Gastrointestinal: Negative for abdominal distention, blood in stool, diarrhea and nausea.  Genitourinary: Negative for frequency and hematuria.  Musculoskeletal: Negative for back pain, gait problem, joint swelling and neck pain.  Skin: Negative for rash.  Neurological: Negative for dizziness, tremors, speech difficulty and weakness.  Psychiatric/Behavioral: Negative for agitation, dysphoric mood and sleep disturbance. The patient is not nervous/anxious.     Objective:  BP 136/84 (BP Location: Left Arm, Patient Position: Sitting, Cuff Size: Large)   Pulse (!) 53   Temp 97.8 F (36.6 C) (Oral)   Ht  (1.727 m)   Wt 206 lb (93.4 kg)   SpO2 98%   BMI 31.32 kg/m   BP  Readings from Last 3 Encounters:  10/27/17 136/84  07/29/17 134/72  06/30/17 (!) 146/78    Wt Readings from Last 3 Encounters:  10/27/17 206 lb (93.4 kg)  07/29/17 207 lb (93.9 kg)  06/30/17 210 lb (95.3 kg)    Physical Exam  Constitutional: He is oriented to person, place, and time. He appears well-developed. No distress.  NAD  HENT:  Mouth/Throat: Oropharynx is clear and moist.  Eyes: Pupils are equal, round, and reactive to light. Conjunctivae are normal.  Neck: Normal range of motion. No JVD present. No thyromegaly present.  Cardiovascular: Normal rate, regular rhythm, normal heart sounds and intact distal pulses. Exam reveals no gallop and no friction rub.  No murmur heard. Pulmonary/Chest: Effort normal and breath sounds normal. No respiratory distress. He has no wheezes. He has no rales. He exhibits no tenderness.  Abdominal: Soft. Bowel sounds are normal. He exhibits no distension and no mass. There is no tenderness. There is no rebound and no guarding.  Musculoskeletal: Normal range of motion. He exhibits no edema or tenderness.  Lymphadenopathy:    He has no cervical adenopathy.  Neurological: He is alert and oriented to person, place, and time. He has normal reflexes. No cranial nerve deficit. He exhibits normal muscle tone. He displays a negative Romberg sign. Coordination and gait normal.  Skin: Skin is warm and dry. No rash noted.  Psychiatric: He has a normal mood and affect. His behavior is normal. Judgment and thought content normal.   Obese LEs w/trace edema  Lab Results  Component Value Date   WBC 4.3 06/30/2017   HGB 15.4 06/30/2017   HCT 45.6 06/30/2017   PLT 219.0 06/30/2017   GLUCOSE 131 (H) 06/30/2017   CHOL 183 12/30/2016   TRIG 228.0 (H) 12/30/2016   HDL 33.20 (L) 12/30/2016   LDLDIRECT 114.0 12/30/2016   LDLCALC 152 (H) 12/20/2015   ALT 11 12/30/2016   AST 12 12/30/2016   NA 139 06/30/2017   K 4.4 06/30/2017   CL 102 06/30/2017   CREATININE  1.06 06/30/2017   BUN 11 06/30/2017   CO2 27 06/30/2017   TSH 3.04 06/30/2017   PSA 0.42 12/30/2016   HGBA1C 5.7 12/30/2016   MICROALBUR 2.9 (H) 12/30/2016    No results found.  Assessment & Plan:   There are no diagnoses linked to this encounter. I am having Loel Lofty. [place] maintain his Vitamin D, potassium chloride, valsartan-hydrochlorothiazide, b complex vitamins, and furosemide.  No orders of the defined types were placed in this encounter.    Follow-up: No follow-ups on file.  Sonda Primes, MD

## 2017-10-27 NOTE — Assessment & Plan Note (Signed)
Declined statins. 

## 2017-10-27 NOTE — Assessment & Plan Note (Signed)
On KCl 

## 2017-10-27 NOTE — Assessment & Plan Note (Signed)
Low dose Norvasc if tolerated. Will watch

## 2017-12-17 ENCOUNTER — Telehealth: Payer: Self-pay | Admitting: Internal Medicine

## 2017-12-17 NOTE — Telephone Encounter (Signed)
Patient is supposed to be taking Amlodipine 5mg  and Telmisartan 40mg . Pt states "he never got Telmisartan" even though pharmacy verified the pick up. Pt informed to contact pharmacy to see if they will let him refill early.

## 2017-12-17 NOTE — Telephone Encounter (Signed)
Patient came by the office with questions regarding his prescriptions. Patient has been on amLODipine-benazepril (LOTREL) 10-20 MG capsule but was recently send in a script for amLODipine (NORVASC) 5 MG tablet. He was not aware of any change and is confused as to what he should or should not be taking. From what he told me, he has only been taking the amLODipine-benazepril (LOTREL) 10-20 MG capsule. Please advise. Patient would like a call back for clarification.

## 2018-01-28 ENCOUNTER — Encounter: Payer: Self-pay | Admitting: Internal Medicine

## 2018-01-28 ENCOUNTER — Ambulatory Visit (INDEPENDENT_AMBULATORY_CARE_PROVIDER_SITE_OTHER): Payer: Medicare Other | Admitting: Internal Medicine

## 2018-01-28 VITALS — BP 132/78 | HR 58 | Temp 97.8°F | Ht 68.0 in | Wt 209.0 lb

## 2018-01-28 DIAGNOSIS — I10 Essential (primary) hypertension: Secondary | ICD-10-CM

## 2018-01-28 DIAGNOSIS — R739 Hyperglycemia, unspecified: Secondary | ICD-10-CM | POA: Diagnosis not present

## 2018-01-28 DIAGNOSIS — R600 Localized edema: Secondary | ICD-10-CM

## 2018-01-28 DIAGNOSIS — E876 Hypokalemia: Secondary | ICD-10-CM

## 2018-01-28 DIAGNOSIS — R5383 Other fatigue: Secondary | ICD-10-CM | POA: Diagnosis not present

## 2018-01-28 DIAGNOSIS — Z0000 Encounter for general adult medical examination without abnormal findings: Secondary | ICD-10-CM

## 2018-01-28 DIAGNOSIS — N320 Bladder-neck obstruction: Secondary | ICD-10-CM

## 2018-01-28 MED ORDER — AMLODIPINE BESY-BENAZEPRIL HCL 5-20 MG PO CAPS: 1.0000 | ORAL_CAPSULE | Freq: Every day | ORAL | 3 refills | Status: DC

## 2018-01-28 NOTE — Assessment & Plan Note (Signed)
C/o fatigue on meds. He was feeling better on Lotrel 5/20...- re-start

## 2018-01-28 NOTE — Assessment & Plan Note (Signed)
BMET 

## 2018-01-28 NOTE — Assessment & Plan Note (Signed)
Resolved Not taking Furosemide/KCl

## 2018-01-28 NOTE — Progress Notes (Signed)
Subjective:  Patient ID: [name], male    DOB: 05/18/1941  Age: 77 y.o. MRN: [id number]  CC: No chief complaint on file.   HPI [name] presents for HTN C/o fatigue on meds. He was feeling better on Lotrel 5/20...  Outpatient Medications Prior to Visit  Medication Sig Dispense Refill  . b complex vitamins tablet Take 1 tablet by mouth daily. 100 tablet 3  . Cholecalciferol (VITAMIN D) 1000 UNITS capsule Take 1 capsule (1,000 Units total) by mouth daily. 100 capsule 3  . furosemide (LASIX) 40 MG tablet Take 1 tablet (40 mg total) by mouth daily as needed for edema (swelling). 30 tablet 3  . potassium chloride (KLOR-CON) 8 MEQ tablet Take 1 tablet (8 mEq total) by mouth daily. 90 tablet 2  . amLODipine (NORVASC) 5 MG tablet Take 1 tablet (5 mg total) by mouth daily. 90 tablet 3  . telmisartan (MICARDIS) 40 MG tablet Take 1 tablet (40 mg total) by mouth daily. 90 tablet 3   No facility-administered medications prior to visit.     ROS: Review of Systems  Constitutional: Positive for fatigue. Negative for appetite change and unexpected weight change.  HENT: Negative for congestion, nosebleeds, sneezing, sore throat and trouble swallowing.   Eyes: Negative for itching and visual disturbance.  Respiratory: Negative for cough.   Cardiovascular: Negative for chest pain, palpitations and leg swelling.  Gastrointestinal: Negative for abdominal distention, blood in stool, diarrhea and nausea.  Genitourinary: Negative for frequency and hematuria.  Musculoskeletal: Negative for back pain, gait problem, joint swelling and neck pain.  Skin: Negative for rash.  Neurological: Negative for dizziness, tremors, speech difficulty and weakness.  Psychiatric/Behavioral: Negative for agitation, dysphoric mood and sleep disturbance. The patient is not nervous/anxious.     Objective:  BP 132/78 (BP Location: Left Arm, Patient Position: Sitting, Cuff Size: Normal)   Pulse (!) 58   Temp 97.8  F (36.6 C) (Oral)   Ht 5\' 8"  (1.727 m)   Wt 209 lb (94.8 kg)   SpO2 94%   BMI 31.78 kg/m   BP Readings from Last 3 Encounters:  01/28/18 132/78  10/27/17 136/84  07/29/17 134/72    Wt Readings from Last 3 Encounters:  01/28/18 209 lb (94.8 kg)  10/27/17 206 lb (93.4 kg)  07/29/17 207 lb (93.9 kg)    Physical Exam  Constitutional: He is oriented to person, place, and time. He appears well-developed. No distress.  NAD  HENT:  Mouth/Throat: Oropharynx is clear and moist.  Eyes: Pupils are equal, round, and reactive to light. Conjunctivae are normal.  Neck: Normal range of motion. No JVD present. No thyromegaly present.  Cardiovascular: Normal rate, regular rhythm, normal heart sounds and intact distal pulses. Exam reveals no gallop and no friction rub.  No murmur heard. Pulmonary/Chest: Effort normal and breath sounds normal. No respiratory distress. He has no wheezes. He has no rales. He exhibits no tenderness.  Abdominal: Soft. Bowel sounds are normal. He exhibits no distension and no mass. There is no tenderness. There is no rebound and no guarding.  Musculoskeletal: Normal range of motion. He exhibits no edema or tenderness.  Lymphadenopathy:    He has no cervical adenopathy.  Neurological: He is alert and oriented to person, place, and time. He has normal reflexes. No cranial nerve deficit. He exhibits normal muscle tone. He displays a negative Romberg sign. Coordination and gait normal.  Skin: Skin is warm and dry. No rash noted.  Psychiatric: He  has a normal mood and affect. His behavior is normal. Judgment and thought content normal.    Lab Results  Component Value Date   WBC 4.3 06/30/2017   HGB 15.4 06/30/2017   HCT 45.6 06/30/2017   PLT 219.0 06/30/2017   GLUCOSE 86 10/27/2017   CHOL 183 12/30/2016   TRIG 228.0 (H) 12/30/2016   HDL 33.20 (L) 12/30/2016   LDLDIRECT 114.0 12/30/2016   LDLCALC 152 (H) 12/20/2015   ALT 11 12/30/2016   AST 12 12/30/2016   NA  140 10/27/2017   K 4.5 10/27/2017   CL 100 10/27/2017   CREATININE 1.32 10/27/2017   BUN 18 10/27/2017   CO2 33 (H) 10/27/2017   TSH 3.04 06/30/2017   PSA 0.42 12/30/2016   HGBA1C 5.9 10/27/2017   MICROALBUR 2.9 (H) 12/30/2016    No results found.  Assessment & Plan:   There are no diagnoses linked to this encounter.   No orders of the defined types were placed in this encounter.    Follow-up: No follow-ups on file.  Sonda Primes, MD

## 2018-01-28 NOTE — Assessment & Plan Note (Signed)
labs

## 2018-01-28 NOTE — Assessment & Plan Note (Signed)
C/o fatigue on meds. He was feeling better on Lotrel 5/20...- re-start  Labs

## 2018-03-26 DIAGNOSIS — G5139 Clonic hemifacial spasm, unspecified: Secondary | ICD-10-CM | POA: Diagnosis not present

## 2018-04-17 DIAGNOSIS — G5139 Clonic hemifacial spasm, unspecified: Secondary | ICD-10-CM | POA: Diagnosis not present

## 2018-07-12 ENCOUNTER — Other Ambulatory Visit: Payer: Self-pay | Admitting: Internal Medicine

## 2018-08-04 ENCOUNTER — Encounter: Payer: Self-pay | Admitting: Internal Medicine

## 2018-08-04 ENCOUNTER — Other Ambulatory Visit (INDEPENDENT_AMBULATORY_CARE_PROVIDER_SITE_OTHER): Payer: Medicare Other

## 2018-08-04 ENCOUNTER — Ambulatory Visit: Payer: Medicare Other | Admitting: Internal Medicine

## 2018-08-04 VITALS — BP 136/82 | HR 55 | Temp 97.8°F | Ht 68.0 in | Wt 204.0 lb

## 2018-08-04 DIAGNOSIS — Z0000 Encounter for general adult medical examination without abnormal findings: Secondary | ICD-10-CM

## 2018-08-04 DIAGNOSIS — E785 Hyperlipidemia, unspecified: Secondary | ICD-10-CM

## 2018-08-04 DIAGNOSIS — N320 Bladder-neck obstruction: Secondary | ICD-10-CM | POA: Diagnosis not present

## 2018-08-04 DIAGNOSIS — R5383 Other fatigue: Secondary | ICD-10-CM | POA: Diagnosis not present

## 2018-08-04 DIAGNOSIS — I10 Essential (primary) hypertension: Secondary | ICD-10-CM

## 2018-08-04 LAB — BASIC METABOLIC PANEL
BUN: 14 mg/dL (ref 6–23)
CHLORIDE: 103 meq/L (ref 96–112)
CO2: 30 mEq/L (ref 19–32)
Calcium: 8.9 mg/dL (ref 8.4–10.5)
Creatinine, Ser: 1.11 mg/dL (ref 0.40–1.50)
GFR: 77.38 mL/min (ref >60.00)
GLUCOSE: 96 mg/dL (ref 70–99)
Potassium: 3.8 mEq/L (ref 3.5–5.1)
Sodium: 140 mEq/L (ref 135–145)

## 2018-08-04 LAB — URINALYSIS
Bilirubin Urine: NEGATIVE
Hgb urine dipstick: NEGATIVE
Leukocytes,Ua: NEGATIVE
Nitrite: NEGATIVE
Specific Gravity, Urine: 1.02 (ref 1.000–1.030)
Total Protein, Urine: NEGATIVE
Urine Glucose: NEGATIVE
Urobilinogen, UA: 1 (ref 0.0–1.0)
pH: 6 (ref 5.0–8.0)

## 2018-08-04 LAB — HEPATIC FUNCTION PANEL
ALT: 10 U/L (ref 0–53)
AST: 12 U/L (ref 0–37)
Albumin: 4 g/dL (ref 3.5–5.2)
Alkaline Phosphatase: 104 U/L (ref 39–117)
Bilirubin, Direct: 0.2 mg/dL (ref 0.0–0.3)
Total Bilirubin: 1.4 mg/dL — ABNORMAL HIGH (ref 0.2–1.2)
Total Protein: 6.2 g/dL (ref 6.0–8.3)

## 2018-08-04 LAB — CBC WITH DIFFERENTIAL/PLATELET
Basophils Absolute: 0.1 10*3/uL (ref 0.0–0.1)
Basophils Relative: 1.6 % (ref 0.0–3.0)
Eosinophils Absolute: 0.1 10*3/uL (ref 0.0–0.7)
Eosinophils Relative: 1.6 % (ref 0.0–5.0)
HCT: 44.8 % (ref 39.0–52.0)
Hemoglobin: 15.8 g/dL (ref 13.0–17.0)
Lymphocytes Relative: 45.8 % (ref 12.0–46.0)
Lymphs Abs: 1.8 10*3/uL (ref 0.7–4.0)
MCHC: 35.4 g/dL (ref 30.0–36.0)
MCV: 86.6 fl (ref 78.0–100.0)
MONO ABS: 0.5 10*3/uL (ref 0.1–1.0)
Monocytes Relative: 11.9 % (ref 3.0–12.0)
Neutro Abs: 1.5 10*3/uL (ref 1.4–7.7)
Neutrophils Relative %: 39.1 % — ABNORMAL LOW (ref 43.0–77.0)
Platelets: 189 10*3/uL (ref 150.0–400.0)
RBC: 5.17 Mil/uL (ref 4.22–5.81)
RDW: 13.5 % (ref 11.5–15.5)
WBC: 3.9 10*3/uL — ABNORMAL LOW (ref 4.0–10.5)

## 2018-08-04 LAB — LIPID PANEL
CHOL/HDL RATIO: 6
Cholesterol: 183 mg/dL (ref 0–200)
HDL: 30.4 mg/dL — ABNORMAL LOW (ref >39.00)
LDL Cholesterol: 124 mg/dL — ABNORMAL HIGH (ref 0–99)
NonHDL: 153.03
Triglycerides: 143 mg/dL (ref 0.0–149.0)
VLDL: 28.6 mg/dL (ref 0.0–40.0)

## 2018-08-04 LAB — TSH: TSH: 2.24 u[IU]/mL (ref 0.35–4.50)

## 2018-08-04 LAB — PSA: PSA: 0.31 ng/mL (ref 0.10–4.00)

## 2018-08-04 MED ORDER — AMLODIPINE BESY-BENAZEPRIL HCL 5-20 MG PO CAPS: 1.0000 | ORAL_CAPSULE | Freq: Every day | ORAL | 3 refills | Status: DC

## 2018-08-04 MED ORDER — POTASSIUM CHLORIDE ER 8 MEQ PO TBCR: 8.0000 meq | EXTENDED_RELEASE_TABLET | Freq: Every day | ORAL | 3 refills | Status: DC

## 2018-08-04 NOTE — Assessment & Plan Note (Signed)
Here for medicare wellness/physical  Diet: heart healthy  Physical activity: not sedentary  Depression/mood screen: negative  Hearing: intact to whispered voice  Visual acuity: grossly normal w/glasses, performs annual eye exam  ADLs: capable  Fall risk: low to none  Home safety: good  Cognitive evaluation: intact to orientation, naming, recall and repetition  EOL planning: adv directives, full code/ I agree  I have personally reviewed and have noted  1. The patient's medical, surgical and social history  2. Their use of alcohol, tobacco or illicit drugs  3. Their current medications and supplements  4. The patient's functional ability including ADL's, fall risks, home safety risks and hearing or visual impairment.  5. Diet and physical activities  6. Evidence for depression or mood disorders 7. The roster of all physicians providing medical care to patient - is listed in the Snapshot section of the chart and reviewed today.    Today patient counseled on age appropriate routine health concerns for screening and prevention, each reviewed and up to date or declined. Immunizations reviewed and up to date or declined. Labs ordered and reviewed. Risk factors for depression reviewed and negative. Hearing function and visual acuity are intact. ADLs screened and addressed as needed. Functional ability and level of safety reviewed and appropriate. Education, counseling and referrals performed based on assessed risks today. Patient provided with a copy of personalized plan for preventive services.  A cardiac CT scan for calcium scoring offered  Declined zostavax

## 2018-08-04 NOTE — Progress Notes (Signed)
Subjective:  Patient ID: [name], male    DOB: 09/13/1940  Age: 78 y.o. MRN: [id number]  CC: No chief complaint on file.   HPI [name] presents for a well exam F/u dyslipidemia, HTN  Outpatient Medications Prior to Visit  Medication Sig Dispense Refill  . amLODipine-benazepril (LOTREL) 5-20 MG capsule Take 1 capsule by mouth daily. 90 capsule 3  . b complex vitamins tablet Take 1 tablet by mouth daily. 100 tablet 3  . Cholecalciferol (VITAMIN D) 1000 UNITS capsule Take 1 capsule (1,000 Units total) by mouth daily. 100 capsule 3  . potassium chloride (KLOR-CON) 8 MEQ tablet TAKE 1 TABLET BY MOUTH DAILY*PHYSICAL WITH LABS DUE FOR REFILLS* 90 tablet 3  . furosemide (LASIX) 40 MG tablet Take 1 tablet (40 mg total) by mouth daily as needed for edema (swelling). 30 tablet 3   No facility-administered medications prior to visit.     ROS: Review of Systems  Constitutional: Negative for appetite change, fatigue and unexpected weight change.  HENT: Negative for congestion, nosebleeds, sneezing, sore throat and trouble swallowing.   Eyes: Negative for itching and visual disturbance.  Respiratory: Negative for cough.   Cardiovascular: Negative for chest pain, palpitations and leg swelling.  Gastrointestinal: Negative for abdominal distention, blood in stool, diarrhea and nausea.  Genitourinary: Negative for frequency and hematuria.  Musculoskeletal: Positive for arthralgias. Negative for back pain, gait problem, joint swelling and neck pain.  Skin: Negative for rash.  Neurological: Negative for dizziness, tremors, speech difficulty and weakness.  Psychiatric/Behavioral: Negative for agitation, dysphoric mood and sleep disturbance. The patient is not nervous/anxious.     Objective:  BP 136/82 (BP Location: Left Arm, Patient Position: Sitting, Cuff Size: Normal)   Pulse (!) 55   Temp 97.8 F (36.6 C) (Oral)   Ht 5\' 8"  (1.727 m)   Wt 204 lb (92.5 kg)   SpO2 97%   BMI 31.02  kg/m   BP Readings from Last 3 Encounters:  08/04/18 136/82  01/28/18 132/78  10/27/17 136/84    Wt Readings from Last 3 Encounters:  08/04/18 204 lb (92.5 kg)  01/28/18 209 lb (94.8 kg)  10/27/17 206 lb (93.4 kg)    Physical Exam Constitutional:      General: He is not in acute distress.    Appearance: He is well-developed.     Comments: NAD  Eyes:     Conjunctiva/sclera: Conjunctivae normal.     Pupils: Pupils are equal, round, and reactive to light.  Neck:     Musculoskeletal: Normal range of motion.     Thyroid: No thyromegaly.     Vascular: No JVD.  Cardiovascular:     Rate and Rhythm: Normal rate and regular rhythm.     Heart sounds: Normal heart sounds. No murmur. No friction rub. No gallop.   Pulmonary:     Effort: Pulmonary effort is normal. No respiratory distress.     Breath sounds: Normal breath sounds. No wheezing or rales.  Chest:     Chest wall: No tenderness.  Abdominal:     General: Bowel sounds are normal. There is no distension.     Palpations: Abdomen is soft. There is no mass.     Tenderness: There is no abdominal tenderness. There is no guarding or rebound.  Musculoskeletal: Normal range of motion.        General: No tenderness.  Lymphadenopathy:     Cervical: No cervical adenopathy.  Skin:    General: Skin is  warm and dry.     Findings: No rash.  Neurological:     Mental Status: He is alert and oriented to person, place, and time.     Cranial Nerves: No cranial nerve deficit.     Motor: No abnormal muscle tone.     Coordination: Coordination normal.     Gait: Gait normal.     Deep Tendon Reflexes: Reflexes are normal and symmetric.  Psychiatric:        Behavior: Behavior normal.        Thought Content: Thought content normal.        Judgment: Judgment normal.     Lab Results  Component Value Date   WBC 4.3 06/30/2017   HGB 15.4 06/30/2017   HCT 45.6 06/30/2017   PLT 219.0 06/30/2017   GLUCOSE 86 10/27/2017   CHOL 183 12/30/2016    TRIG 228.0 (H) 12/30/2016   HDL 33.20 (L) 12/30/2016   LDLDIRECT 114.0 12/30/2016   LDLCALC 152 (H) 12/20/2015   ALT 11 12/30/2016   AST 12 12/30/2016   NA 140 10/27/2017   K 4.5 10/27/2017   CL 100 10/27/2017   CREATININE 1.32 10/27/2017   BUN 18 10/27/2017   CO2 33 (H) 10/27/2017   TSH 3.04 06/30/2017   PSA 0.42 12/30/2016   HGBA1C 5.9 10/27/2017   MICROALBUR 2.9 (H) 12/30/2016    No results found.  Assessment & Plan:   There are no diagnoses linked to this encounter.   No orders of the defined types were placed in this encounter.    Follow-up: No follow-ups on file.  Sonda Primes, MD

## 2018-08-04 NOTE — Patient Instructions (Signed)

## 2018-08-04 NOTE — Assessment & Plan Note (Addendum)
Lotrel A cardiac CT scan for calcium scoring offered

## 2018-08-04 NOTE — Assessment & Plan Note (Signed)
A cardiac CT scan for calcium scoring offered 

## 2018-09-15 ENCOUNTER — Telehealth: Payer: Self-pay | Admitting: Internal Medicine

## 2018-09-15 NOTE — Telephone Encounter (Signed)
Copied from CRM [id number]. Topic: Appointment Scheduling - Scheduling Inquiry for Clinic >> Sep 15, 2018  9:14 AM Crist Infante wrote: Reason for CRM: wife calling to request an appt with Dr Posey Rea for Monday , March 30.  She states pt is having some balance, memory, and walking issues.  Pt had 2 falls over the weekend and she feels something changing with the pt.  Her daughter off work on Monday.  She wants to have him accessed by the dr. She reports pt doing well today, its just sometimes he has a tendency to drag his feet which she feels may have attributed to his fall.  Pt told her he just "lost his balance"  and there is nothing wrong.  She feels differently. Please advise

## 2018-09-15 NOTE — Telephone Encounter (Signed)
Patient scheduled.

## 2018-09-15 NOTE — Telephone Encounter (Signed)
Okay to schedule monday

## 2018-09-21 ENCOUNTER — Ambulatory Visit: Payer: Medicare Other | Admitting: Internal Medicine

## 2018-09-21 ENCOUNTER — Other Ambulatory Visit: Payer: Self-pay

## 2018-09-21 ENCOUNTER — Encounter: Payer: Self-pay | Admitting: Internal Medicine

## 2018-09-21 DIAGNOSIS — R296 Repeated falls: Secondary | ICD-10-CM | POA: Diagnosis not present

## 2018-09-21 DIAGNOSIS — R413 Other amnesia: Secondary | ICD-10-CM | POA: Diagnosis not present

## 2018-09-21 NOTE — Progress Notes (Signed)
Subjective:  Patient ID: [name], male    DOB: Jul 08, 1940  Age: 78 y.o. MRN: [id number]  CC: No chief complaint on file.   HPI [name] presents for stumbling, falls x2, memory loss, weakness x months. It is worse now. No HA C/o ???a foot drop, poor balance. No tic bites etc Comes w/wife  Outpatient Medications Prior to Visit  Medication Sig Dispense Refill  . amLODipine-benazepril (LOTREL) 5-20 MG capsule Take 1 capsule by mouth daily. 90 capsule 3  . b complex vitamins tablet Take 1 tablet by mouth daily. 100 tablet 3  . Cholecalciferol (VITAMIN D) 1000 UNITS capsule Take 1 capsule (1,000 Units total) by mouth daily. 100 capsule 3  . potassium chloride (KLOR-CON) 8 MEQ tablet Take 1 tablet (8 mEq total) by mouth daily. 90 tablet 3  . furosemide (LASIX) 40 MG tablet Take 1 tablet (40 mg total) by mouth daily as needed for edema (swelling). 30 tablet 3   No facility-administered medications prior to visit.     ROS: Review of Systems  Constitutional: Positive for fatigue. Negative for appetite change, chills, fever and unexpected weight change.  HENT: Negative for congestion, nosebleeds, sneezing, sore throat and trouble swallowing.   Eyes: Negative for itching and visual disturbance.  Respiratory: Negative for cough.   Cardiovascular: Negative for chest pain, palpitations and leg swelling.  Gastrointestinal: Negative for abdominal distention, blood in stool, diarrhea and nausea.  Genitourinary: Negative for frequency and hematuria.  Musculoskeletal: Positive for arthralgias and gait problem. Negative for [name] pain, joint swelling and neck pain.  Skin: Negative for rash.  Neurological: Positive for weakness. Negative for dizziness, tremors and speech difficulty.  Psychiatric/Behavioral: Positive for confusion and decreased concentration. Negative for agitation, behavioral problems, dysphoric mood, sleep disturbance and suicidal ideas. The patient is not nervous/anxious.      Objective:  BP (!) 162/84 (BP Location: Left Arm, Patient Position: Sitting, Cuff Size: Large)   Pulse 67   Temp 98 F (36.7 C) (Oral)   Ht 5\' 8"  (1.727 m)   Wt 201 lb (91.2 kg)   SpO2 95%   BMI 30.56 kg/m   BP Readings from Last 3 Encounters:  09/21/18 (!) 162/84  08/04/18 136/82  01/28/18 132/78    Wt Readings from Last 3 Encounters:  09/21/18 201 lb (91.2 kg)  08/04/18 204 lb (92.5 kg)  01/28/18 209 lb (94.8 kg)    Physical Exam Constitutional:      General: He is not in acute distress.    Appearance: Normal appearance. He is well-developed.     Comments: NAD  Eyes:     Conjunctiva/sclera: Conjunctivae normal.     Pupils: Pupils are equal, round, and reactive to light.  Neck:     Musculoskeletal: Normal range of motion.     Thyroid: No thyromegaly.     Vascular: No JVD.  Cardiovascular:     Rate and Rhythm: Normal rate and regular rhythm.     Heart sounds: Normal heart sounds. No murmur. No friction rub. No gallop.   Pulmonary:     Effort: Pulmonary effort is normal. No respiratory distress.     Breath sounds: Normal breath sounds. No wheezing or rales.  Chest:     Chest wall: No tenderness.  Abdominal:     General: Bowel sounds are normal. There is no distension.     Palpations: Abdomen is soft. There is no mass.     Tenderness: There is no abdominal tenderness. There is  no guarding or rebound.  Musculoskeletal: Normal range of motion.        General: No tenderness.  Lymphadenopathy:     Cervical: No cervical adenopathy.  Skin:    General: Skin is warm and dry.     Findings: No rash.  Neurological:     Mental Status: He is alert. He is disoriented.     Cranial Nerves: No cranial nerve deficit.     Motor: No abnormal muscle tone.     Coordination: Coordination abnormal.     Gait: Gait normal.     Deep Tendon Reflexes: Reflexes are normal and symmetric. Reflexes normal.  Psychiatric:        Behavior: Behavior normal.        Thought Content:  Thought content normal.        Judgment: Judgment normal.   oriented x2 Fails serial 7s Slightly ataxic Mild tremor in hands Chronic ptosis      Lab Results  Component Value Date   WBC 3.9 (L) 08/04/2018   HGB 15.8 08/04/2018   HCT 44.8 08/04/2018   PLT 189.0 08/04/2018   GLUCOSE 96 08/04/2018   CHOL 183 08/04/2018   TRIG 143.0 08/04/2018   HDL 30.40 (L) 08/04/2018   LDLDIRECT 114.0 12/30/2016   LDLCALC 124 (H) 08/04/2018   ALT 10 08/04/2018   AST 12 08/04/2018   NA 140 08/04/2018   K 3.8 08/04/2018   CL 103 08/04/2018   CREATININE 1.11 08/04/2018   BUN 14 08/04/2018   CO2 30 08/04/2018   TSH 2.24 08/04/2018   PSA 0.31 08/04/2018   HGBA1C 5.9 10/27/2017   MICROALBUR 2.9 (H) 12/30/2016    No results found.  Assessment & Plan:   There are no diagnoses linked to this encounter.   No orders of the defined types were placed in this encounter.    Follow-up: No follow-ups on file.  Sonda Primes, MD

## 2018-09-21 NOTE — Patient Instructions (Signed)
Do not drive 

## 2018-09-21 NOTE — Assessment & Plan Note (Signed)
CT brain Labs Neurol ref

## 2018-09-21 NOTE — Assessment & Plan Note (Signed)
CT brain Labs Neurol ref RTC 1 mo

## 2018-09-22 ENCOUNTER — Telehealth: Payer: Self-pay | Admitting: *Deleted

## 2018-09-22 NOTE — Telephone Encounter (Signed)
Covid-19 travel screening questions  Have you traveled in the last 14 days? If yes where?  No Do you now or have you had a fever in the last 14 days? No  Do you have any respiratory symptoms of shortness of breath or cough now or in the last 14 days? No Do you have a medical history of Congestive Heart Failure? No Do you have a medical history of lung disease? No Do you have any family members or close contacts with diagnosed or suspected Covid-19? NO  Pt Aware of appt per wife

## 2018-09-23 ENCOUNTER — Other Ambulatory Visit: Payer: Self-pay

## 2018-09-23 ENCOUNTER — Ambulatory Visit (INDEPENDENT_AMBULATORY_CARE_PROVIDER_SITE_OTHER)
Admission: RE | Admit: 2018-09-23 | Discharge: 2018-09-23 | Disposition: A | Discharge status: Home / Self Care | Payer: Medicare Other | Source: Ambulatory Visit | Attending: Internal Medicine | Admitting: Internal Medicine

## 2018-09-23 DIAGNOSIS — R296 Repeated falls: Secondary | ICD-10-CM | POA: Diagnosis not present

## 2018-09-23 DIAGNOSIS — R413 Other amnesia: Secondary | ICD-10-CM

## 2018-09-29 ENCOUNTER — Telehealth: Payer: Self-pay | Admitting: Internal Medicine

## 2018-09-29 NOTE — Telephone Encounter (Signed)
see result note

## 2018-09-29 NOTE — Telephone Encounter (Signed)
Copied from CRM [id number]. Topic: General - Other >> Sep 29, 2018 11:02 AM Tamela Oddi wrote: Reason for CRM: Patient called to get the results of his CT scan last week  Please advise and call patient back at [phone]

## 2018-10-06 NOTE — Addendum Note (Signed)
Addended by: Scarlett Presto on: 10/06/2018 03:31 PM   Modules accepted: Orders

## 2018-10-07 ENCOUNTER — Encounter: Payer: Self-pay | Admitting: Neurology

## 2018-11-02 ENCOUNTER — Telehealth: Payer: Self-pay | Admitting: Internal Medicine

## 2018-11-02 MED ORDER — TRAZODONE HCL 50 MG PO TABS: 25.0000 mg | ORAL_TABLET | Freq: Every evening | ORAL | 0 refills | Status: DC | PRN

## 2018-11-02 NOTE — Telephone Encounter (Signed)
Spoke with pts wife and Neurology is going to do a virtual with pt on Thursday. Wife would like to know if there is something patient can take to help in sleep throughout the night. She is currently giving him tylenol to help with his pain from falling.

## 2018-11-02 NOTE — Telephone Encounter (Signed)
Will send in low dose Trazodone to try until he sees neurology on Thursday.

## 2018-11-02 NOTE — Telephone Encounter (Signed)
Please advise in Dr. Adah Perl absence.  Would you be willing to see patient in office?

## 2018-11-02 NOTE — Telephone Encounter (Signed)
Patient's wife called and said the patient needs to come into office. He is not resting at night, his memory loss is worse. Patient is also combative. Please advise who can see patient in office. Thank you.

## 2018-11-02 NOTE — Telephone Encounter (Signed)
Waiting for call back from Neuro to see if we can get patient seen sooner

## 2018-11-02 NOTE — Telephone Encounter (Signed)
Can you first see what is the status of the neurology follow-up? It looks like Dr. Posey Rea wanted him to see Dr. Adriana Mccallum;

## 2018-11-02 NOTE — Telephone Encounter (Signed)
Pt's wife notified.

## 2018-11-03 DIAGNOSIS — H40009 Preglaucoma, unspecified, unspecified eye: Secondary | ICD-10-CM | POA: Insufficient documentation

## 2018-11-03 DIAGNOSIS — IMO0002 Reserved for concepts with insufficient information to code with codable children: Secondary | ICD-10-CM | POA: Insufficient documentation

## 2018-11-04 ENCOUNTER — Encounter: Payer: Self-pay | Admitting: *Deleted

## 2018-11-04 ENCOUNTER — Other Ambulatory Visit: Payer: Self-pay

## 2018-11-05 ENCOUNTER — Telehealth: Payer: Self-pay | Admitting: Neurology

## 2018-11-05 ENCOUNTER — Telehealth (INDEPENDENT_AMBULATORY_CARE_PROVIDER_SITE_OTHER): Payer: Medicare Other | Admitting: Neurology

## 2018-11-05 ENCOUNTER — Other Ambulatory Visit: Payer: Self-pay

## 2018-11-05 DIAGNOSIS — R413 Other amnesia: Secondary | ICD-10-CM

## 2018-11-05 DIAGNOSIS — R296 Repeated falls: Secondary | ICD-10-CM | POA: Diagnosis not present

## 2018-11-05 DIAGNOSIS — F0391 Unspecified dementia with behavioral disturbance: Secondary | ICD-10-CM

## 2018-11-05 DIAGNOSIS — G20 Parkinson's disease: Secondary | ICD-10-CM

## 2018-11-05 DIAGNOSIS — F03B18 Unspecified dementia, moderate, with other behavioral disturbance: Secondary | ICD-10-CM

## 2018-11-05 MED ORDER — TRAZODONE HCL 50 MG PO TABS: ORAL_TABLET | ORAL | 5 refills | Status: DC

## 2018-11-05 NOTE — Telephone Encounter (Signed)
Sharyl Nimrod called regarding patient and the referral that was sent for home health. She said the wife is needing more help in the evening. She is asking to have a Presenter, broadcasting. Please Call. Thanks

## 2018-11-05 NOTE — Progress Notes (Signed)
Virtual Visit via Video Note The purpose of this virtual visit is to provide medical care while limiting exposure to the novel coronavirus.    Consent was obtained for video visit:  Yes.   Answered questions that patient had about telehealth interaction:  Yes.   I discussed the limitations, risks, security and privacy concerns of performing an evaluation and management service by telemedicine. I also discussed with the patient that there may be a patient responsible charge related to this service. The patient expressed understanding and agreed to proceed.  Pt location: Home Physician Location: office Name of referring provider:  Plotnikov, Georgina Quint, MD I connected with [name] at patients initiation/request on 11/05/2018 at 10:30 AM EDT by video enabled telemedicine application and verified that I am speaking with the correct person using two identifiers. Pt MRN:  [id number] Pt DOB:  1940-10-01 Video Participants:  [name];  Gelene Mink (wife)   History of Present Illness:  This is a pleasant 78 year old right-handed man with a history of hypertension, diet-controlled hyperlipidemia, presenting for evaluation of memory loss and frequent falls. He states his balance is off. He also reports that his memory "ain't as good as it used to be." He misplaces things frequently. 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 He would say someone stole something because he could not find them. It was not happening frequently, she would sometimes notice that he would put things where they don't belong. A year ago she started having driving concerns, she would be in the car with him and would ask where he is going, he would stop in the middle of the street. He stopped driving after instructed by Dr. Posey Rea in March 2020. His wife fixes his pillbox and checks behind him. He continues to manage finances and make financial decisions but she  has been checking behind him the past 4-5 months. She states that over the past 4-5 months, confusion has been worsening that sometimes he does not realize he is at home, always talking about going home. At times he does not seem to recognize his wife. Symptoms are worse in the afternoon. He also has difficulty understanding instructions to pick his feet up or to help himself up when he falls. He has difficulty getting in and out of bed, or even sometimes moving in bed, as if he cannot turn or move his body up. He has not been sleeping well, waking up at 1am despite recently increased Trazodone  qhs. He has been having visual hallucinations, asking who is that in the bed or in the room, worse in the evening. She has been helping him with dressing and bathing the past month.  Her main concern have been the significant falls over the past 4 months. Sometimes he can walk independently, but a lot of times she has to help him up, especially getting in and out of bed, or even from a chair. He has a walker now but forgets to pick up his feet to move. He has had 8 or 12 falls in the past 4 months. If he falls and she tries to help him, he cannot seem to understand instructions to push himself up. He denies any neck or [name] pain, no leg pain, numbness/tingling. It feels like his legs want to give out on him when he stands. No bowel/bladder dysfunction. He has right shoulder pain. His wife is asking about pain medication when he  is so stiff waking up in the morning. He denies any headaches, dizziness, diplopia, dysarthria/dysphagia, anosmia. Sometimes he has some shakiness in his hands like he is nervous. His wife reports personality changes as well, he used to be a jokester. His father had Parkinson's disease. No history of significant head injuries or alcohol use.   I personally reviewed head CT without contrast done 09/23/2018 which did not show any acute changes, there was mild to moderate diffuse atrophy and  chronic microvascular disease.  PAST MEDICAL HISTORY: Past Medical History:  Diagnosis Date   Benign prostatic hypertrophy    Edema    Hyperlipidemia    Hypertension    Insomnia    Parasomnia 10/14/2011   PSG 12/15/11>>AHI 0.4, SpO2 low 89%, PLMI 16, somnoliloquay, nocturia    Seasonal allergies    UTI (lower urinary tract infection)     PAST SURGICAL HISTORY: Past Surgical History:  Procedure Laterality Date   FINGER SURGERY Right 04/19/2014   RIGHT INDEX & RING FINGER   FROM DOG BITE    I&D EXTREMITY Right 04/20/2014   Procedure: IRRIGATION AND DEBRIDEMENT right index finger and right ring finger;  Surgeon: Dominica SeverinWilliam Gramig, MD;  Location: [facility] OR;  Service: Orthopedics;  Laterality: Right;   I&D EXTREMITY Right 04/21/2014   Procedure: IRRIGATION AND DEBRIDEMENT RIGHT HAND RING FINGER, INDEX FINGER AND Flexor Tenolysis and FDS Tenotomy;  Surgeon: Dominica SeverinWilliam Gramig, MD;  Location: [facility] OR;  Service: Orthopedics;  Laterality: Right;    MEDICATIONS: Current Outpatient Medications on File Prior to Visit  Medication Sig Dispense Refill   amLODipine-benazepril (LOTREL) 5-20 MG capsule Take 1 capsule by mouth daily. 90 capsule 3   Cholecalciferol (VITAMIN D) 1000 UNITS capsule Take 1 capsule (1,000 Units total) by mouth daily. 100 capsule 3   potassium chloride (KLOR-CON) 8 MEQ tablet Take 1 tablet (8 mEq total) by mouth daily. 90 tablet 3   traZODone (DESYREL) 50 MG tablet Take 0.5-1 tablets (25-50 mg total) by mouth at bedtime as needed for sleep. 30 tablet 0   No current facility-administered medications on file prior to visit.     ALLERGIES: Allergies  Allergen Reactions   Amlodipine     Edema w/10 mg   Atorvastatin     REACTION: leg cramps   Penicillins Hives    Has patient had a PCN reaction causing immediate rash, facial/tongue/throat swelling, SOB or lightheadedness with hypotension: Yes Has patient had a PCN reaction causing severe rash involving mucus  membranes or skin necrosis: No Has patient had a PCN reaction that required hospitalization No Has patient had a PCN reaction occurring within the last 10 years: No If all of the above answers are "NO", then may proceed with Cephalosporin use.    Sulfonamide Derivatives Hives   Telmisartan     ?fatigue   Sulfa Antibiotics Other (See Comments) and Palpitations    Sweating, passed out    FAMILY HISTORY: Family History  Problem Relation Age of Onset   Diabetes Mother    Diabetes Other       Observations/Objective:   GEN:  The patient appears stated age and is in NAD. Neurological examination: Orientation: The patient is alert and oriented x3. No aphasia or dysarthria. Decreased fluency. Able to follow simple commands. Remote and recent memory impaired. Difficulty with naming and repetition. Cranial nerves: Extraocular movements intact with no nystagmus. No facial asymmetry. Motor: moves all extremities symmetrically, at least anti-gravity x 4, no clear weakness noted. No incoordination on finger to nose  testing. Movement examination: Tone: unable Abnormal movements: no tremor noted on video Coordination:  There is no decremation with RAM's with finger taps Gait and Station: The patient has difficulty arising out of a deep-seated chair without the use of the hands, wife has to help him. Head is flexed down with hunched posture, no arm swing with hands flexed at his sides, decreased stride length. Slight sway with Romberg test.  Montreal Cognitive Assessment  11/04/2018  Visuospatial/ Executive (0/5) 1  Naming (0/3) 1  Attention: Read list of digits (0/2) 1  Attention: Read list of letters (0/1) 0  Attention: Serial 7 subtraction starting at 100 (0/3) 0  Language: Repeat phrase (0/2) 1  Language : Fluency (0/1) 0  Abstraction (0/2) 0  Delayed Recall (0/5) 0  Orientation (0/6) 5  Total 9  Adjusted Score (based on education) 9    Assessment and Plan:   This is a pleasant 78  year old right-handed man with a history of hypertension, hyperlipidemia, presenting for evaluation of worsening memory and frequent falls. His MOCA score today is 9/30, symptoms suggestive of moderate dementia, etiology unclear. The frequent falls, gait instability, and possibly bradykinesia/rigidity his wife describes are concerning for a parkinsonian syndrome such as PSP. MRI brain with and without contrast will be ordered to assess for underlying structural abnormality. We discussed doing a trial of Sinemet, however one of his wife's main concerns currently are his agitation and poor sleep, which are affecting her as well. We have agreed to do one medication change at a time, increase Trazodone to 75mg  qhs for a week, then 100mg  qhs. His wife will update our office in 2 weeks and we will plan to do a trial of Sinemet. He needs in-home physical therapy, as well as home health/caregivers to help his wife. Follow-up in 3 months, they know to call for any changes.   Follow Up Instructions:   -I discussed the assessment and treatment plan with the patient/wife. The patient/wife were provided an opportunity to ask questions and all were answered. The patient/wife agreed with the plan and demonstrated an understanding of the instructions.   The patient/wife were advised to call [name] or seek an in-person evaluation if the symptoms worsen or if the condition fails to improve as anticipated.   Van Clines, MD

## 2018-11-06 ENCOUNTER — Other Ambulatory Visit: Payer: Self-pay

## 2018-11-06 ENCOUNTER — Ambulatory Visit: Payer: Self-pay

## 2018-11-06 ENCOUNTER — Telehealth: Payer: Self-pay | Admitting: Neurology

## 2018-11-06 DIAGNOSIS — F03B18 Unspecified dementia, moderate, with other behavioral disturbance: Secondary | ICD-10-CM

## 2018-11-06 DIAGNOSIS — F0391 Unspecified dementia with behavioral disturbance: Secondary | ICD-10-CM

## 2018-11-06 DIAGNOSIS — R296 Repeated falls: Secondary | ICD-10-CM

## 2018-11-06 DIAGNOSIS — R413 Other amnesia: Secondary | ICD-10-CM

## 2018-11-06 NOTE — Telephone Encounter (Addendum)
Incoming coming call from Patient and Patient wife. Both state that the Patients legs are severely swollen states wife and Patient states not swollen. Bad.  Wife states that it is shinny and severely swollen. Patient blood pressure was 132/82 HR was 20 Wife states that she an place a finger in his leg then there would be an indentation.  Denies Pain.  Provided care advice and reviewed car advice.  Patient states that  He stopped taking the lasix.  Encouraged him to take it.   Does request a return phone call for PCP.    Patient and wife Picked up phone during the delivery of message of answering machine Reason for Disposition . [1] MODERATE leg swelling (e.g., swelling extends up to knees) AND [2] new onset or worsening  Answer Assessment - Initial Assessment Questions 1. ONSET: "When did the swelling start?" (e.g., minutes, hours, days)     *No Answer* 2 to 3 years or more 2. LOCATION: "What part of the leg is swollen?"  "Are both legs swollen or just one leg?"     *No Answer* ankles and the foot 3. SEVERITY: "How bad is the swelling?" (e.g., localized; mild, moderate, severe)  - Localized - small area of swelling localized to one leg  - MILD pedal edema - swelling limited to foot and ankle, pitting edema < 1/4 inch (6 mm) deep, rest and elevation eliminate most or all swelling  - MODERATE edema - swelling of lower leg to knee, pitting edema > 1/4 inch (6 mm) deep, rest and elevation only partially reduce swelling  - SEVERE edema - swelling extends above knee, facial or hand swelling present      Shinniny, severe  4. REDNESS: "Does the swelling look red or infected?"     discoloration5. PAIN: "Is the swelling painful to touch?" If so, ask: "How painful is it?"   (Scale 1-10; mild, moderate or severe)  denies pain  6. FEVER: "Do you have a fever?" If so, ask: "What is it, how was it measured, and when did it start?"      denies 7. CAUSE: "What do you think is causing the leg swelling?"     *No  Answer* 8. MEDICAL HISTORY: "Do you have a history of heart failure, kidney disease, liver failure, or cancer?"     denies 9. RECURRENT SYMPTOM: "Have you had leg swelling before?" If so, ask: "When was the last time?" "What happened that time?"     Was on lasix and been off 1 to 2 years 10. OTHER SYMPTOMS: "Do you have any other symptoms?" (e.g., chest pain, difficulty breathing)      Denies, winded at time 11. PREGNANCY: "Is there any chance you are pregnant?" "When was your last menstrual period?"       na  Protocols used: LEG SWELLING AND EDEMA-A-AH

## 2018-11-06 NOTE — Telephone Encounter (Signed)
[facility] called regarding patient and needing to get a verbal orders from PT. Please call [phone] and leave message on Voicemail. Needing Verbal okay to start next week and referral for Speech Therapy. Thanks

## 2018-11-06 NOTE — Telephone Encounter (Signed)
Spoke with Dr Karel Jarvis verbal order given for Social worker Evaluation, Sharyl Nimrod called back and given verbal order, order also placed in Epic

## 2018-11-06 NOTE — Progress Notes (Signed)
Speech referral placed

## 2018-11-06 NOTE — Telephone Encounter (Signed)
[name] called back no answer voice mail left for them to call back

## 2018-11-10 NOTE — Telephone Encounter (Signed)
Pt does not have any lasix and is still experiencing lower leg/ankle swelling. Pt stopped medication last year due to it causing him to go to the bathroom more often. Would you like to see patient or have on restart medication? Please advise

## 2018-11-11 ENCOUNTER — Other Ambulatory Visit: Payer: Self-pay | Admitting: Internal Medicine

## 2018-11-11 MED ORDER — FUROSEMIDE 20 MG PO TABS: 20.0000 mg | ORAL_TABLET | Freq: Every day | ORAL | 3 refills | Status: DC | PRN

## 2018-11-11 NOTE — Telephone Encounter (Signed)
Restart furosemide PRN. Prescription emailed.  Follow-up with me in 1 week.  Thanks

## 2018-11-11 NOTE — Telephone Encounter (Signed)
Patient's wife informed of below. OV scheduled.

## 2018-11-12 ENCOUNTER — Telehealth: Payer: Self-pay | Admitting: Neurology

## 2018-11-12 NOTE — Telephone Encounter (Signed)
Called [facility] they are going to have Millie call back so I can talk to her

## 2018-11-12 NOTE — Telephone Encounter (Signed)
Is there a number for social worker? Pls call her back and let her know I think he has a type of Parkinson's disease causing his symptoms. I had discussed medications with his wife, she was going to call 2 weeks from his last visit and we may start Sinemet. Looks like he is scheduled for the MRI June 1. Main thing is they need more help at home, if this is something she can help them with. Thanks

## 2018-11-12 NOTE — Telephone Encounter (Signed)
Millie/ Social Worker from [employer] called regarding this patient and needing to let Dr. Karel Jarvis know that there are some memory concerns as well as Agitation . He is falling a lot and is very Rigid and it's very to to assist him getting up. He is shuffling his feet, Forgetfulness and Hallucinations. His appetite is still good. Thanks

## 2018-11-17 NOTE — Telephone Encounter (Signed)
Still unable to talk to [facility] from [facility] Dr. Karel Jarvis informed,

## 2018-11-19 ENCOUNTER — Other Ambulatory Visit (INDEPENDENT_AMBULATORY_CARE_PROVIDER_SITE_OTHER): Payer: Medicare Other

## 2018-11-19 ENCOUNTER — Encounter: Payer: Self-pay | Admitting: Internal Medicine

## 2018-11-19 ENCOUNTER — Ambulatory Visit (INDEPENDENT_AMBULATORY_CARE_PROVIDER_SITE_OTHER)
Admission: RE | Admit: 2018-11-19 | Discharge: 2018-11-19 | Disposition: A | Discharge status: Home / Self Care | Payer: Medicare Other | Source: Ambulatory Visit | Attending: Internal Medicine | Admitting: Internal Medicine

## 2018-11-19 ENCOUNTER — Other Ambulatory Visit: Payer: Self-pay

## 2018-11-19 ENCOUNTER — Ambulatory Visit (INDEPENDENT_AMBULATORY_CARE_PROVIDER_SITE_OTHER): Payer: Medicare Other | Admitting: Internal Medicine

## 2018-11-19 ENCOUNTER — Other Ambulatory Visit: Payer: Self-pay | Admitting: Internal Medicine

## 2018-11-19 VITALS — BP 132/74 | HR 75 | Temp 98.4°F | Ht 70.0 in | Wt 197.0 lb

## 2018-11-19 DIAGNOSIS — I10 Essential (primary) hypertension: Secondary | ICD-10-CM | POA: Diagnosis not present

## 2018-11-19 DIAGNOSIS — R413 Other amnesia: Secondary | ICD-10-CM | POA: Diagnosis not present

## 2018-11-19 DIAGNOSIS — R443 Hallucinations, unspecified: Secondary | ICD-10-CM | POA: Insufficient documentation

## 2018-11-19 DIAGNOSIS — M25511 Pain in right shoulder: Secondary | ICD-10-CM

## 2018-11-19 DIAGNOSIS — R202 Paresthesia of skin: Secondary | ICD-10-CM

## 2018-11-19 DIAGNOSIS — E538 Deficiency of other specified B group vitamins: Secondary | ICD-10-CM

## 2018-11-19 DIAGNOSIS — M25519 Pain in unspecified shoulder: Secondary | ICD-10-CM | POA: Insufficient documentation

## 2018-11-19 HISTORY — DX: Deficiency of other specified B group vitamins: E53.8

## 2018-11-19 LAB — BASIC METABOLIC PANEL
BUN: 14 mg/dL (ref 6–23)
CO2: 29 mEq/L (ref 19–32)
Calcium: 8.9 mg/dL (ref 8.4–10.5)
Chloride: 103 mEq/L (ref 96–112)
Creatinine, Ser: 1.27 mg/dL (ref 0.40–1.50)
GFR: 66.2 mL/min (ref >60.00)
Glucose, Bld: 99 mg/dL (ref 70–99)
Potassium: 4.2 mEq/L (ref 3.5–5.1)
Sodium: 139 mEq/L (ref 135–145)

## 2018-11-19 LAB — VITAMIN B12: Vitamin B-12: 89 pg/mL — ABNORMAL LOW (ref 211–911)

## 2018-11-19 MED ORDER — "SYRINGE/NEEDLE (DISP) 25G X 1"" 3 ML MISC": 3 refills | Status: AC

## 2018-11-19 MED ORDER — CYANOCOBALAMIN 1000 MCG/ML IJ SOLN: INTRAMUSCULAR | 6 refills | Status: DC

## 2018-11-19 NOTE — Assessment & Plan Note (Signed)
Lotrel

## 2018-11-19 NOTE — Assessment & Plan Note (Signed)
S/p Neurol eval - Dr Karel Jarvis

## 2018-11-19 NOTE — Assessment & Plan Note (Signed)
Better on Trazodone Brain MRI pending

## 2018-11-19 NOTE — Assessment & Plan Note (Addendum)
right - nl exam X ray Tylenol prn

## 2018-11-19 NOTE — Progress Notes (Signed)
Subjective:  Patient ID: [name], male    DOB: 05/29/1941  Age: 78 y.o. MRN: [id number]  CC: No chief complaint on file.   HPI [name] presents for dementia C/o sun downing, hallucinations - starting 4-6 pm. Confused at night. Seeing people in the house. Trazodone helped... F/u HTN, ?parkinson's C/o R shoulder pain  Outpatient Medications Prior to Visit  Medication Sig Dispense Refill  . amLODipine-benazepril (LOTREL) 5-20 MG capsule Take 1 capsule by mouth daily. 90 capsule 3  . Cholecalciferol (VITAMIN D) 1000 UNITS capsule Take 1 capsule (1,000 Units total) by mouth daily. 100 capsule 3  . furosemide (LASIX) 20 MG tablet Take 1 tablet (20 mg total) by mouth daily as needed for edema. 30 tablet 3  . potassium chloride (KLOR-CON) 8 MEQ tablet Take 1 tablet (8 mEq total) by mouth daily. 90 tablet 3  . traZODone (DESYREL) 50 MG tablet Take 2 tablets at bedtime 60 tablet 5   No facility-administered medications prior to visit.     ROS: Review of Systems  Constitutional: Negative for appetite change, fatigue and unexpected weight change.  HENT: Negative for congestion, nosebleeds, sneezing, sore throat and trouble swallowing.   Eyes: Negative for itching and visual disturbance.  Respiratory: Negative for cough.   Cardiovascular: Negative for chest pain, palpitations and leg swelling.  Gastrointestinal: Negative for abdominal distention, blood in stool, diarrhea and nausea.  Genitourinary: Negative for frequency and hematuria.  Musculoskeletal: Positive for gait problem. Negative for back pain, joint swelling and neck pain.  Skin: Negative for rash.  Neurological: Negative for dizziness, tremors, speech difficulty and weakness.  Psychiatric/Behavioral: Positive for confusion, decreased concentration, hallucinations and sleep disturbance. Negative for agitation, dysphoric mood and suicidal ideas. The patient is not nervous/anxious.     Objective:  BP 132/74 (BP  Location: Right Arm, Patient Position: Sitting, Cuff Size: Large)   Pulse 75   Temp 98.4 F (36.9 C) (Oral)   Ht 5\' 10"  (1.778 m)   Wt 197 lb (89.4 kg)   SpO2 95%   BMI 28.27 kg/m   BP Readings from Last 3 Encounters:  11/19/18 132/74  09/21/18 (!) 162/84  08/04/18 136/82    Wt Readings from Last 3 Encounters:  11/19/18 197 lb (89.4 kg)  11/04/18 210 lb (95.3 kg)  09/21/18 201 lb (91.2 kg)    Physical Exam Constitutional:      General: He is not in acute distress.    Appearance: He is well-developed.     Comments: NAD  Eyes:     Conjunctiva/sclera: Conjunctivae normal.     Pupils: Pupils are equal, round, and reactive to light.  Neck:     Musculoskeletal: Normal range of motion.     Thyroid: No thyromegaly.     Vascular: No JVD.  Cardiovascular:     Rate and Rhythm: Normal rate and regular rhythm.     Heart sounds: Normal heart sounds. No murmur. No friction rub. No gallop.   Pulmonary:     Effort: Pulmonary effort is normal. No respiratory distress.     Breath sounds: Normal breath sounds. No wheezing or rales.  Chest:     Chest wall: No tenderness.  Abdominal:     General: Bowel sounds are normal. There is no distension.     Palpations: Abdomen is soft. There is no mass.     Tenderness: There is no abdominal tenderness. There is no guarding or rebound.  Musculoskeletal: Normal range of motion.  General: No tenderness.  Lymphadenopathy:     Cervical: No cervical adenopathy.  Skin:    General: Skin is warm and dry.     Findings: No rash.  Neurological:     Mental Status: He is alert. He is disoriented.     Cranial Nerves: No cranial nerve deficit.     Motor: No abnormal muscle tone.     Coordination: Coordination abnormal.     Gait: Gait normal.     Deep Tendon Reflexes: Reflexes are normal and symmetric.  Psychiatric:        Mood and Affect: Mood normal.        Behavior: Behavior normal.   alert/cooperative Shoulder, neck - NT  Lab Results   Component Value Date   WBC 3.9 (L) 08/04/2018   HGB 15.8 08/04/2018   HCT 44.8 08/04/2018   PLT 189.0 08/04/2018   GLUCOSE 96 08/04/2018   CHOL 183 08/04/2018   TRIG 143.0 08/04/2018   HDL 30.40 (L) 08/04/2018   LDLDIRECT 114.0 12/30/2016   LDLCALC 124 (H) 08/04/2018   ALT 10 08/04/2018   AST 12 08/04/2018   NA 140 08/04/2018   K 3.8 08/04/2018   CL 103 08/04/2018   CREATININE 1.11 08/04/2018   BUN 14 08/04/2018   CO2 30 08/04/2018   TSH 2.24 08/04/2018   PSA 0.31 08/04/2018   HGBA1C 5.9 10/27/2017   MICROALBUR 2.9 (H) 12/30/2016    Ct Head Wo Contrast  Result Date: 09/23/2018 CLINICAL DATA:  78 year old male with a history of stumbling, frequent falls, memory loss and weakness for the past several months EXAM: CT HEAD WITHOUT CONTRAST TECHNIQUE: Contiguous axial images were obtained from the base of the skull through the vertex without intravenous contrast. COMPARISON:  Prior brain MRI 11/25/2011 FINDINGS: Brain: No evidence of acute infarction, hemorrhage, hydrocephalus, extra-axial collection or mass lesion/mass effect. Stable cortical and central atrophy with mild ex vacuo dilatation of the lateral ventricles. Stable periventricular white matter hypoattenuation most consistent with chronic microvascular ischemic white matter disease. Vascular: No hyperdense vessel or unexpected calcification. Skull: Normal. Negative for fracture or focal lesion. Sinuses/Orbits: No acute finding. Other: None. IMPRESSION: 1. No acute intracranial abnormality. 2. Chronic atrophy and microvascular ischemic white matter disease. Electronically Signed   By: Malachy Moan M.D.   On: 09/23/2018 14:55    Assessment & Plan:   There are no diagnoses linked to this encounter.   No orders of the defined types were placed in this encounter.    Follow-up: No follow-ups on file.  Sonda Primes, MD

## 2018-11-20 ENCOUNTER — Ambulatory Visit (INDEPENDENT_AMBULATORY_CARE_PROVIDER_SITE_OTHER): Payer: Medicare Other

## 2018-11-20 DIAGNOSIS — E538 Deficiency of other specified B group vitamins: Secondary | ICD-10-CM

## 2018-11-20 MED ORDER — CYANOCOBALAMIN 1000 MCG/ML IJ SOLN
1000.0000 ug | Freq: Once | INTRAMUSCULAR | Status: AC
  Administered 2018-11-20: 1000 ug via INTRAMUSCULAR

## 2018-11-20 NOTE — Progress Notes (Signed)
b12 Injection given.   [name] J Deborrah Mabin, MD  

## 2018-11-23 ENCOUNTER — Other Ambulatory Visit: Payer: Self-pay

## 2018-11-23 ENCOUNTER — Ambulatory Visit
Admission: RE | Admit: 2018-11-23 | Discharge: 2018-11-23 | Disposition: A | Discharge status: Home / Self Care | Payer: Medicare Other | Source: Ambulatory Visit | Attending: Neurology | Admitting: Neurology

## 2018-11-23 ENCOUNTER — Ambulatory Visit: Payer: Medicare Other | Admitting: Internal Medicine

## 2018-11-23 DIAGNOSIS — F0391 Unspecified dementia with behavioral disturbance: Secondary | ICD-10-CM

## 2018-11-23 DIAGNOSIS — R413 Other amnesia: Secondary | ICD-10-CM

## 2018-11-23 DIAGNOSIS — F03B18 Unspecified dementia, moderate, with other behavioral disturbance: Secondary | ICD-10-CM

## 2018-11-23 DIAGNOSIS — R296 Repeated falls: Secondary | ICD-10-CM

## 2018-11-23 MED ORDER — GADOBENATE DIMEGLUMINE 529 MG/ML IV SOLN
18.0000 mL | Freq: Once | INTRAVENOUS | Status: AC | PRN
  Administered 2018-11-23: 18 mL via INTRAVENOUS

## 2018-11-24 ENCOUNTER — Telehealth: Payer: Self-pay

## 2018-11-24 NOTE — Telephone Encounter (Signed)
Pt wife called given results of  MRI brain did not show any evidence of tumor, stroke, or bleed. It showed age-related changes pt wife verbalized understanding

## 2018-12-02 ENCOUNTER — Ambulatory Visit: Payer: Self-pay | Admitting: *Deleted

## 2018-12-02 NOTE — Telephone Encounter (Signed)
Please advise, would you like to see patient?

## 2018-12-02 NOTE — Telephone Encounter (Signed)
'  Lovey Newcomer' SLP from [facility] calling, pt and pts wife on call as well.  States pt has open wound on neck, "Right below Adams apple". Noted yesterday. States size of quarter. Reports oozing "Whitish drainage at times, small amounts."  Tender with palpation only. States area surrounding "slightly red."  States afebrile, no dysphagia, no swelling of throat. After hours call. Advised per home care protocol. Assured [place] would route encounter to practice for Dr. Judeen Hammans review. Instructed to go to ED if fever occurs, SOB, dysphagia. Verbalizes understanding.  Please advise CB# [phone]  Reason for Disposition . 1 or 2 small sores  Answer Assessment - Initial Assessment Questions 1. APPEARANCE of SORES: "What do the sores look like?"     One open wound, size of quarter 2. NUMBER: "How many sores are there?"     one 3. SIZE: "How big is the largest sore?"     Quarter size 4. LOCATION: "Where are the sores located?"     Neck   "Right below adams apple." 5. ONSET: "When did the sores begin?"     yesterday 6. CAUSE: "What do you think is causing the sores?"     unsure 7. OTHER SYMPTOMS: "Do you have any other symptoms?" (e.g., fever, new weakness)     no  Protocols used: SORES-A-AH

## 2018-12-03 NOTE — Telephone Encounter (Signed)
Yes. Thx.

## 2018-12-03 NOTE — Telephone Encounter (Signed)
Please schedule OV.  

## 2018-12-03 NOTE — Telephone Encounter (Signed)
appt scheduled

## 2018-12-04 ENCOUNTER — Encounter: Payer: Self-pay | Admitting: Internal Medicine

## 2018-12-04 ENCOUNTER — Other Ambulatory Visit: Payer: Self-pay

## 2018-12-04 ENCOUNTER — Ambulatory Visit (INDEPENDENT_AMBULATORY_CARE_PROVIDER_SITE_OTHER): Payer: Medicare Other | Admitting: Internal Medicine

## 2018-12-04 ENCOUNTER — Telehealth: Payer: Self-pay | Admitting: Neurology

## 2018-12-04 DIAGNOSIS — L723 Sebaceous cyst: Secondary | ICD-10-CM | POA: Diagnosis not present

## 2018-12-04 DIAGNOSIS — L089 Local infection of the skin and subcutaneous tissue, unspecified: Secondary | ICD-10-CM | POA: Diagnosis not present

## 2018-12-04 MED ORDER — DOXYCYCLINE HYCLATE 100 MG PO TABS: 100.0000 mg | ORAL_TABLET | Freq: Two times a day (BID) | ORAL | 0 refills | Status: DC

## 2018-12-04 MED ORDER — MUPIROCIN 2 % EX OINT: TOPICAL_OINTMENT | CUTANEOUS | 0 refills | Status: DC

## 2018-12-04 NOTE — Assessment & Plan Note (Signed)
See I&D Doxy Muporocin

## 2018-12-04 NOTE — Telephone Encounter (Signed)
Okay, thanks

## 2018-12-04 NOTE — Telephone Encounter (Signed)
Spoke with [place] with [facility] will push Mr Wence DC back one week

## 2018-12-04 NOTE — Patient Instructions (Signed)
Remove packing on Sat or Sun Call ifproblems

## 2018-12-04 NOTE — Telephone Encounter (Signed)
[facility] calling, she need verbal orders for pt's discharge papers. Pls call her.

## 2018-12-04 NOTE — Progress Notes (Signed)
Subjective:  Patient ID: [name], male    DOB: 07-16-1940  Age: 78 y.o. MRN: [id number]  CC: No chief complaint on file.   HPI [name] presents for a mass on neck since Wed, [place]  Outpatient Medications Prior to Visit  Medication Sig Dispense Refill  . amLODipine-benazepril (LOTREL) 5-20 MG capsule Take 1 capsule by mouth daily. 90 capsule 3  . Cholecalciferol (VITAMIN D) 1000 UNITS capsule Take 1 capsule (1,000 Units total) by mouth daily. 100 capsule 3  . cyanocobalamin (,VITAMIN B-12,) 1000 MCG/ML injection 1 ml sq for 5 days, then 1 ml weekly for 4 weeks, then 1 ml every 2 weeks 10 mL 6  . furosemide (LASIX) 20 MG tablet Take 1 tablet (20 mg total) by mouth daily as needed for edema. 30 tablet 3  . potassium chloride (KLOR-CON) 8 MEQ tablet Take 1 tablet (8 mEq total) by mouth daily. 90 tablet 3  . SYRINGE-NEEDLE, DISP, 3 ML (BD ECLIPSE SYRINGE) 25G X 1" 3 ML MISC Use sq as directed 50 each 3  . traZODone (DESYREL) 50 MG tablet Take 2 tablets at bedtime 60 tablet 5   No facility-administered medications prior to visit.     ROS: Review of Systems  Constitutional: Negative for fatigue and fever.  Skin: Positive for color change and wound.  Neurological: Negative for weakness.  Hematological: Negative for adenopathy. Does not bruise/bleed easily.  Psychiatric/Behavioral: Positive for decreased concentration. Negative for confusion. The patient is not nervous/anxious.     Objective:  BP 130/76 (BP Location: Right Arm, Patient Position: Sitting, Cuff Size: Normal)   Pulse 75   Temp 98 F (36.7 C) (Oral)   Ht 5\' 10"  (1.778 m)   Wt 195 lb (88.5 kg)   SpO2 96%   BMI 27.98 kg/m   BP Readings from Last 3 Encounters:  12/04/18 130/76  11/19/18 132/74  09/21/18 (!) 162/84    Wt Readings from Last 3 Encounters:  12/04/18 195 lb (88.5 kg)  11/19/18 197 lb (89.4 kg)  11/04/18 210 lb (95.3 kg)    Physical Exam Cardiovascular:     Rate and Rhythm: Normal rate  and regular rhythm.  Skin:    Findings: Erythema and lesion present.  Neurological:     Mental Status: He is alert.  Psychiatric:        Mood and Affect: Mood normal.   there is a 3x2 cm subcutaneous abscess at the neck base  Procedure note:  Incision and Drainage of an Abscess   Indication : a localized collection of pus that is tender and not spontaneously resolving.    Risks including unsuccessful procedure , possible need for a repeat procedure due to pus accumulation, scar formation, and others as well as benefits were explained to the patient in detail. Written consent was obtained/signed.    The patient was placed in a decubitus position. The area of an abscess was prepped with povidone-iodine and draped in a sterile fashion. Local anesthesia with   1.5    cc of 2% lidocaine and epinephrine  was administered.  1 cm incision with #11strait blade was made. About 2-3 cc of purulent and cream cheese like material was expressed. The abscess cavity was explored with a sterile hemostat and the walled- off pockets and septae were broken down bluntly. The cavity was irrigated with the rest of the anesthetic in the syringe and packed with 3 inches of  the iodoform gauze.   The wound was dressed  with antibiotic ointment and Telfa pad.  Tolerated well. Complications: None.   Wound instructions provided.   Wound instructions : change dressing once a day or twice a day is needed. Change dressing after  shower in the morning.  Pat dry the wound with gauze. Pull out one inch of packing everyday and cut it off. Re-dress wound with antibiotic ointment and Telfa pad or a Band-Aid of appropriate size.   Please contact us if you notice a recollection of pus in the abscess fever and chills increased pain redness red streaks near the abscess increased swelling in the area.   Lab Results  Component Value Date   WBC 3.9 (L) 08/04/2018   HGB 15.8 08/04/2018   HCT 44.8 08/04/2018   PLT 189.0  08/04/2018   GLUCOSE 99 11/19/2018   CHOL 183 08/04/2018   TRIG 143.0 08/04/2018   HDL 30.40 (L) 08/04/2018   LDLDIRECT 114.0 12/30/2016   LDLCALC 124 (H) 08/04/2018   ALT 10 08/04/2018   AST 12 08/04/2018   NA 139 11/19/2018   K 4.2 11/19/2018   CL 103 11/19/2018   CREATININE 1.27 11/19/2018   BUN 14 11/19/2018   CO2 29 11/19/2018   TSH 2.24 08/04/2018   PSA 0.31 08/04/2018   HGBA1C 5.9 10/27/2017   MICROALBUR 2.9 (H) 12/30/2016    Mr Brain W Wo Contrast  Result Date: 11/23/2018 CLINICAL DATA:  Worsening memory loss. Frequent falling. Moderate dementia. EXAM: MRI HEAD WITHOUT AND WITH CONTRAST TECHNIQUE: Multiplanar, multiecho pulse sequences of the brain and surrounding structures were obtained without and with intravenous contrast. CONTRAST:  18mL MULTIHANCE GADOBENATE DIMEGLUMINE 529 MG/ML IV SOLN COMPARISON:  CT 09/23/2018 FINDINGS: Brain: Diffusion imaging does not show any acute or subacute infarction. Brainstem and cerebellum are unremarkable. Cerebral hemispheres show generalized atrophy with some temporal lobe predominance. There are mild to moderate chronic small-vessel ischemic changes of the deep white matter. No cortical or large vessel territory infarction. No mass lesion, hemorrhage, hydrocephalus or extra-axial collection. After contrast administration, no abnormal enhancement occurs. Vascular: Major vessels at the base of the brain show flow. Skull and upper cervical spine: Negative Sinuses/Orbits: Clear/normal.  Previous lens implant on the right. Other: None IMPRESSION: No acute or reversible finding. Atrophy of the cerebral hemispheres with temporal lobe predominance. Mild moderate chronic small-vessel change of the white matter. Electronically Signed   By: Paulina FusiMark  Shogry M.D.   On: 11/23/2018 15:22    Assessment & Plan:   There are no diagnoses linked to this encounter.   No orders of the defined types were placed in this encounter.    Follow-up: No follow-ups on  file.  Sonda PrimesAlex Plotnikov, MD

## 2018-12-08 ENCOUNTER — Ambulatory Visit: Payer: Medicare Other | Admitting: Neurology

## 2018-12-09 ENCOUNTER — Other Ambulatory Visit: Payer: Self-pay | Admitting: Neurology

## 2019-01-23 DIAGNOSIS — A419 Sepsis, unspecified organism: Secondary | ICD-10-CM

## 2019-01-23 HISTORY — DX: Sepsis, unspecified organism: A41.9

## 2019-02-02 ENCOUNTER — Ambulatory Visit: Payer: Medicare Other | Admitting: Internal Medicine

## 2019-02-03 ENCOUNTER — Other Ambulatory Visit: Payer: Self-pay | Admitting: Internal Medicine

## 2019-02-13 ENCOUNTER — Inpatient Hospital Stay (HOSPITAL_COMMUNITY): Payer: Medicare Other

## 2019-02-13 ENCOUNTER — Inpatient Hospital Stay (HOSPITAL_COMMUNITY)
Admission: EM | Admit: 2019-02-13 | Discharge: 2019-02-24 | DRG: 871 | Disposition: A | Discharge status: Skilled Nursing Facility | Payer: Medicare Other | Attending: Internal Medicine | Admitting: Internal Medicine

## 2019-02-13 ENCOUNTER — Other Ambulatory Visit: Payer: Self-pay

## 2019-02-13 ENCOUNTER — Encounter (HOSPITAL_COMMUNITY): Payer: Self-pay | Admitting: Internal Medicine

## 2019-02-13 ENCOUNTER — Emergency Department (HOSPITAL_COMMUNITY): Payer: Medicare Other

## 2019-02-13 DIAGNOSIS — N400 Enlarged prostate without lower urinary tract symptoms: Secondary | ICD-10-CM | POA: Diagnosis present

## 2019-02-13 DIAGNOSIS — G20 Parkinson's disease: Secondary | ICD-10-CM | POA: Diagnosis present

## 2019-02-13 DIAGNOSIS — A419 Sepsis, unspecified organism: Secondary | ICD-10-CM | POA: Diagnosis not present

## 2019-02-13 DIAGNOSIS — D696 Thrombocytopenia, unspecified: Secondary | ICD-10-CM | POA: Diagnosis present

## 2019-02-13 DIAGNOSIS — Z87891 Personal history of nicotine dependence: Secondary | ICD-10-CM | POA: Diagnosis not present

## 2019-02-13 DIAGNOSIS — I10 Essential (primary) hypertension: Secondary | ICD-10-CM | POA: Diagnosis present

## 2019-02-13 DIAGNOSIS — L8990 Pressure ulcer of unspecified site, unspecified stage: Secondary | ICD-10-CM | POA: Insufficient documentation

## 2019-02-13 DIAGNOSIS — E43 Unspecified severe protein-calorie malnutrition: Secondary | ICD-10-CM | POA: Diagnosis not present

## 2019-02-13 DIAGNOSIS — M6282 Rhabdomyolysis: Secondary | ICD-10-CM | POA: Diagnosis present

## 2019-02-13 DIAGNOSIS — N179 Acute kidney failure, unspecified: Secondary | ICD-10-CM | POA: Diagnosis present

## 2019-02-13 DIAGNOSIS — L89159 Pressure ulcer of sacral region, unspecified stage: Secondary | ICD-10-CM | POA: Diagnosis present

## 2019-02-13 DIAGNOSIS — Z20828 Contact with and (suspected) exposure to other viral communicable diseases: Secondary | ICD-10-CM | POA: Diagnosis present

## 2019-02-13 DIAGNOSIS — Z6827 Body mass index (BMI) 27.0-27.9, adult: Secondary | ICD-10-CM | POA: Diagnosis not present

## 2019-02-13 DIAGNOSIS — Z833 Family history of diabetes mellitus: Secondary | ICD-10-CM | POA: Diagnosis not present

## 2019-02-13 DIAGNOSIS — R6520 Severe sepsis without septic shock: Secondary | ICD-10-CM | POA: Diagnosis not present

## 2019-02-13 DIAGNOSIS — R410 Disorientation, unspecified: Secondary | ICD-10-CM | POA: Diagnosis present

## 2019-02-13 DIAGNOSIS — E860 Dehydration: Secondary | ICD-10-CM | POA: Diagnosis present

## 2019-02-13 DIAGNOSIS — F0280 Dementia in other diseases classified elsewhere without behavioral disturbance: Secondary | ICD-10-CM | POA: Diagnosis present

## 2019-02-13 DIAGNOSIS — A4159 Other Gram-negative sepsis: Secondary | ICD-10-CM | POA: Diagnosis present

## 2019-02-13 DIAGNOSIS — G20A1 Parkinson's disease without dyskinesia, without mention of fluctuations: Secondary | ICD-10-CM | POA: Diagnosis present

## 2019-02-13 DIAGNOSIS — E785 Hyperlipidemia, unspecified: Secondary | ICD-10-CM | POA: Diagnosis present

## 2019-02-13 DIAGNOSIS — R6521 Severe sepsis with septic shock: Secondary | ICD-10-CM

## 2019-02-13 LAB — CBC WITH DIFFERENTIAL/PLATELET
Abs Immature Granulocytes: 0.18 10*3/uL — ABNORMAL HIGH (ref 0.00–0.07)
Basophils Absolute: 0 10*3/uL (ref 0.0–0.1)
Basophils Relative: 0 %
Eosinophils Absolute: 0 10*3/uL (ref 0.0–0.5)
Eosinophils Relative: 0 %
HCT: 42.2 % (ref 39.0–52.0)
Hemoglobin: 13.9 g/dL (ref 13.0–17.0)
Immature Granulocytes: 2 %
Lymphocytes Relative: 5 %
Lymphs Abs: 0.6 10*3/uL — ABNORMAL LOW (ref 0.7–4.0)
MCH: 29.8 pg (ref 26.0–34.0)
MCHC: 32.9 g/dL (ref 30.0–36.0)
MCV: 90.6 fL (ref 80.0–100.0)
Monocytes Absolute: 1.1 10*3/uL — ABNORMAL HIGH (ref 0.1–1.0)
Monocytes Relative: 9 %
Neutro Abs: 10.4 10*3/uL — ABNORMAL HIGH (ref 1.7–7.7)
Neutrophils Relative %: 84 %
Platelets: 117 10*3/uL — ABNORMAL LOW (ref 150–400)
RBC: 4.66 MIL/uL (ref 4.22–5.81)
RDW: 12.7 % (ref 11.5–15.5)
WBC: 12.3 10*3/uL — ABNORMAL HIGH (ref 4.0–10.5)
nRBC: 0 % (ref 0.0–0.2)

## 2019-02-13 LAB — COMPREHENSIVE METABOLIC PANEL
ALT: 20 U/L (ref 0–44)
AST: 39 U/L (ref 15–41)
Albumin: 2.8 g/dL — ABNORMAL LOW (ref 3.5–5.0)
Alkaline Phosphatase: 63 U/L (ref 38–126)
Anion gap: 13 (ref 5–15)
BUN: 53 mg/dL — ABNORMAL HIGH (ref 8–23)
CO2: 24 mmol/L (ref 22–32)
Calcium: 8.8 mg/dL — ABNORMAL LOW (ref 8.9–10.3)
Chloride: 101 mmol/L (ref 98–111)
Creatinine, Ser: 4.65 mg/dL — ABNORMAL HIGH (ref 0.61–1.24)
GFR calc Af Amer: 13 mL/min — ABNORMAL LOW (ref >60)
GFR calc non Af Amer: 11 mL/min — ABNORMAL LOW (ref >60)
Glucose, Bld: 131 mg/dL — ABNORMAL HIGH (ref 70–99)
Potassium: 3.7 mmol/L (ref 3.5–5.1)
Sodium: 138 mmol/L (ref 135–145)
Total Bilirubin: 1.7 mg/dL — ABNORMAL HIGH (ref 0.3–1.2)
Total Protein: 5.8 g/dL — ABNORMAL LOW (ref 6.5–8.1)

## 2019-02-13 LAB — LACTIC ACID, PLASMA
Lactic Acid, Venous: 2 mmol/L (ref 0.5–1.9)
Lactic Acid, Venous: 1.2 mmol/L (ref 0.5–1.9)

## 2019-02-13 LAB — APTT: aPTT: 29 seconds (ref 24–36)

## 2019-02-13 LAB — LACTATE DEHYDROGENASE: LDH: 176 U/L (ref 98–192)

## 2019-02-13 LAB — PROTIME-INR
INR: 1.4 — ABNORMAL HIGH (ref 0.8–1.2)
Prothrombin Time: 17.1 seconds — ABNORMAL HIGH (ref 11.4–15.2)

## 2019-02-13 LAB — CK: Total CK: 1141 U/L — ABNORMAL HIGH (ref 49–397)

## 2019-02-13 LAB — HEMOGLOBIN A1C
Hgb A1c MFr Bld: 5.5 % (ref 4.8–5.6)
Mean Plasma Glucose: 111.15 mg/dL

## 2019-02-13 MED ORDER — HEPARIN SODIUM (PORCINE) 5000 UNIT/ML IJ SOLN
5000.0000 [IU] | Freq: Three times a day (TID) | INTRAMUSCULAR | Status: DC
  Administered 2019-02-13 – 2019-02-24 (×27): 5000 [IU] via SUBCUTANEOUS
  Filled 2019-02-13 (×27): qty 1

## 2019-02-13 MED ORDER — SODIUM CHLORIDE 0.9 % IV SOLN
2.0000 g | Freq: Once | INTRAVENOUS | Status: AC
  Administered 2019-02-13: 15:00:00 2 g via INTRAVENOUS
  Filled 2019-02-13: qty 2

## 2019-02-13 MED ORDER — ACETAMINOPHEN 650 MG RE SUPP: 650.0000 mg | Freq: Four times a day (QID) | RECTAL | Status: DC | PRN

## 2019-02-13 MED ORDER — VANCOMYCIN HCL IN DEXTROSE 1-5 GM/200ML-% IV SOLN: 1000.0000 mg | Freq: Once | INTRAVENOUS | Status: DC

## 2019-02-13 MED ORDER — METRONIDAZOLE IN NACL 5-0.79 MG/ML-% IV SOLN
500.0000 mg | Freq: Once | INTRAVENOUS | Status: AC
  Administered 2019-02-13: 500 mg via INTRAVENOUS
  Filled 2019-02-13: qty 100

## 2019-02-13 MED ORDER — SODIUM CHLORIDE 0.9 % IV BOLUS (SEPSIS)
1000.0000 mL | Freq: Once | INTRAVENOUS | Status: AC
  Administered 2019-02-13: 1000 mL via INTRAVENOUS

## 2019-02-13 MED ORDER — ACETAMINOPHEN 650 MG RE SUPP
650.0000 mg | Freq: Once | RECTAL | Status: AC
  Administered 2019-02-13: 650 mg via RECTAL
  Filled 2019-02-13: qty 1

## 2019-02-13 MED ORDER — VANCOMYCIN HCL 10 G IV SOLR
1750.0000 mg | Freq: Once | INTRAVENOUS | Status: DC
  Filled 2019-02-13: qty 1750

## 2019-02-13 MED ORDER — SODIUM CHLORIDE 0.9 % IV SOLN
INTRAVENOUS | Status: AC
  Administered 2019-02-13: 19:00:00 via INTRAVENOUS

## 2019-02-13 MED ORDER — VANCOMYCIN VARIABLE DOSE PER UNSTABLE RENAL FUNCTION (PHARMACIST DOSING): Status: DC

## 2019-02-13 MED ORDER — CARBIDOPA-LEVODOPA 25-100 MG PO TABS: 1.0000 | ORAL_TABLET | Freq: Three times a day (TID) | ORAL | Status: DC

## 2019-02-13 MED ORDER — CARBIDOPA-LEVODOPA 25-100 MG PO TABS
0.5000 | ORAL_TABLET | Freq: Three times a day (TID) | ORAL | Status: DC
  Administered 2019-02-13 – 2019-02-24 (×32): 0.5 via ORAL
  Filled 2019-02-13 (×31): qty 1

## 2019-02-13 MED ORDER — ACETAMINOPHEN 325 MG PO TABS: 650.0000 mg | ORAL_TABLET | Freq: Four times a day (QID) | ORAL | Status: DC | PRN

## 2019-02-13 MED ORDER — SODIUM CHLORIDE 0.9 % IV SOLN
2.0000 g | INTRAVENOUS | Status: DC
  Filled 2019-02-13: qty 2

## 2019-02-13 NOTE — H&P (Signed)
TRH H&P    Patient Demographics:    [name], is a 78 y.o. male  MRN: [id number]  DOB - Oct 28, 1940  Admit Date - 02/13/2019  Referring MD/NP/PA:  Alphonzo Lemmings  Outpatient Primary MD for the patient is Plotnikov, Evie Lacks, MD Dr. Delice Lesch -  Neurology  Patient coming from:  home  Chief complaint-  Fever, lethargy, poor po intake   HPI:    [name]  is a 78 y.o. male,   w hypertension, hyperlipidemia, w new diagnosis of parkinsons dementia apparently presents with c/o decrease oral intake since Wednesday.  Pt started to have fever yesterday am per his wife.  Wife is providing the history .  Pt is only axox1 (person), not place or time.  Pt making urine.  No complaints of dysuria. Might have had increase in urinary frequency.  Per wife, no fever, chills, no cp, no palp, no sob, no n/v, no abd pain, no diarrhea, no brbpr, no hematuria, no flank pain.  Pt brought to ED due to fever.   In ED,  T 101  p 84, R 20 Bp 98/57  Pox 97% on RA Wt 88.5 kg  CXR IMPRESSION: Unchanged elevation of the left hemidiaphragm with associated atelectasis or consolidation. No new or acute appearing airspace opacity.  CPK 1,141 (elevated)   Wbc 12.3, Hgb 13.9, Plt 117 Na 138, K 3.7, Bun 53, Creatinine 4.65 Ast 39, Alt 20, Alk phos 63, T. Bili 1.7 Glucose 131, Hga1c=5.5 Lactic acid 2.0 INR 1.4 Blood culture x2 pending Urinalysis pending  Renal ultrasound -> pending  vanco 1,750gm iv x1 Flagyl 571m iv x1, and cefepime 286miv x1 given in ED.   Pt will be admitted for ARF, and sepsis (fever, hypotension, leukocytosis, and elevated lactic acid)    Review of systems:    In addition to the HPI above, pt is unable to answer clearly due to dementia.    No Headache, No changes with Vision or hearing, No problems swallowing food or Liquids, No Chest pain, Cough or Shortness of Breath, No Abdominal pain, No Nausea  or Vomiting, bowel movements are regular, No Blood in stool or Urine, No dysuria, No new skin rashes or bruises, No new joints pains-aches,  No new weakness, tingling, numbness in any extremity, No recent weight gain or loss, No polyuria, polydypsia or polyphagia, No significant Mental Stressors.  All other systems reviewed and are negative.    Past History of the following :    Past Medical History:  Diagnosis Date   B12 deficiency 11/19/2018   Benign prostatic hypertrophy    Edema    Hyperlipidemia    Hypertension    Insomnia    Parasomnia 10/14/2011   PSG 12/15/11>>AHI 0.4, SpO2 low 89%, PLMI 16, somnoliloquay, nocturia    Prediabetes 06/10/2012   Seasonal allergies    UTI (lower urinary tract infection)       Past Surgical History:  Procedure Laterality Date   FINGER SURGERY Right 04/19/2014   RIGHT INDEX & RING FINGER   FROM  DOG BITE    I&D EXTREMITY Right 04/20/2014   Procedure: IRRIGATION AND DEBRIDEMENT right index finger and right ring finger;  Surgeon: Roseanne Kaufman, MD;  Location: [facility];  Service: Orthopedics;  Laterality: Right;   I&D EXTREMITY Right 04/21/2014   Procedure: IRRIGATION AND DEBRIDEMENT RIGHT HAND RING FINGER, INDEX FINGER AND Flexor Tenolysis and FDS Tenotomy;  Surgeon: Roseanne Kaufman, MD;  Location: [facility];  Service: Orthopedics;  Laterality: Right;      Social History:      Social History   Tobacco Use   Smoking status: Former Smoker    Packs/day: 1.00    Years: 7.00    Pack years: 7.00    Types: Cigarettes, Cigars   Smokeless tobacco: Never Used   Tobacco comment: cigars day/ 1-2       QIUIT SMOKING IN 2000  Substance Use Topics   Alcohol use: Not Currently    Comment: OCCASSIONAL WINE -1 glass/month       Family History :     Family History  Problem Relation Age of Onset   Diabetes Mother    Diabetes Other        Home Medications:   Prior to Admission medications   Medication Sig Start Date End  Date Taking? Authorizing Provider  amLODipine-benazepril (LOTREL) 5-20 MG capsule Take 1 capsule by mouth daily. 08/04/18  Yes Plotnikov, Evie Lacks, MD  Cholecalciferol (VITAMIN D) 1000 UNITS capsule Take 1 capsule (1,000 Units total) by mouth daily. 09/09/12  Yes Plotnikov, Evie Lacks, MD  cyanocobalamin (,VITAMIN B-12,) 1000 MCG/ML injection 1 ml sq for 5 days, then 1 ml weekly for 4 weeks, then 1 ml every 2 weeks 11/19/18  Yes Plotnikov, Evie Lacks, MD  furosemide (LASIX) 20 MG tablet TAKE 1 TABLET (20 MG TOTAL) BY MOUTH DAILY AS NEEDED FOR EDEMA. 02/03/19  Yes Plotnikov, Evie Lacks, MD  potassium chloride (KLOR-CON) 8 MEQ tablet Take 1 tablet (8 mEq total) by mouth daily. 08/04/18  Yes Plotnikov, Evie Lacks, MD  traZODone (DESYREL) 50 MG tablet TAKE 2 TABLETS BY MOUTH EVERY DAY AT BEDTIME Patient taking differently: Take 100 mg by mouth at bedtime.  12/09/18  Yes Cameron Sprang, MD  SYRINGE-NEEDLE, DISP, 3 ML (BD ECLIPSE SYRINGE) 25G X 1" 3 ML MISC Use sq as directed 11/19/18   Plotnikov, Evie Lacks, MD     Allergies:     Allergies  Allergen Reactions   Amlodipine     Edema w/10 mg   Atorvastatin     REACTION: leg cramps   Penicillins Hives    Has patient had a PCN reaction causing immediate rash, facial/tongue/throat swelling, SOB or lightheadedness with hypotension: Yes Has patient had a PCN reaction causing severe rash involving mucus membranes or skin necrosis: No Has patient had a PCN reaction that required hospitalization No Has patient had a PCN reaction occurring within the last 10 years: No If all of the above answers are "NO", then may proceed with Cephalosporin use.    Sulfonamide Derivatives Hives   Telmisartan     ?fatigue   Sulfa Antibiotics Other (See Comments) and Palpitations    Sweating, passed out     Physical Exam:   Vitals  Blood pressure (!) 143/77, pulse 84, temperature 100.2 F (37.9 C), temperature source Rectal, resp. rate (!) 22, height _0  (1.778  m), weight 88.5 kg, SpO2 97 %.  1.  General: Alert and oriented x1  2. Psychiatric: Euthymic 3. Neurologic: Positive for bradykinesia, cranial nerves  II through XII intact, reflexes 2+, symmetric, diffuse with no clonus, motor 5/5 in all 4 extremities  4. HEENMT:  Anicteric, pupils 1.5 mm, symmetric, direct, consensual reflexes intact Neck no JVD no bruit no thyromegaly  5. Respiratory : CTAB  6. Cardiovascular : Borderline tachycardic S1-S2 no murmurs gallops or rubs  7. Gastrointestinal:  Abdomen: Soft nontender nondistended positive bowel sounds, no CVA tenderness  8. Skin:  Extremities no cyanosis clubbing or edema, positive slight varicose veins, onychomycosis  9.Musculoskeletal:  Range of motion, no swelling of the knees    Data Review:    CBC Recent Labs  Lab 02/13/19 1412  WBC 12.3*  HGB 13.9  HCT 42.2  PLT 117*  MCV 90.6  MCH 29.8  MCHC 32.9  RDW 12.7  LYMPHSABS 0.6*  MONOABS 1.1*  EOSABS 0.0  BASOSABS 0.0   ------------------------------------------------------------------------------------------------------------------  Results for orders placed or performed during the hospital encounter of 02/13/19 (from the past 48 hour(s))  Lactic acid, plasma     Status: Abnormal   Collection Time: 02/13/19  2:12 PM  Result Value Ref Range   Lactic Acid, Venous 2.0 (HH) 0.5 - 1.9 mmol/L    Comment: CRITICAL RESULT CALLED TO, READ BACK BY AND VERIFIED WITH: Rosezella Rumpf RN @ 8250 ON 02/13/2019 BY TEMOCHE, H Performed at [facility], [place] [address] [place], [place] [address]   Comprehensive metabolic panel     Status: Abnormal   Collection Time: 02/13/19  2:12 PM  Result Value Ref Range   Sodium 138 135 - 145 mmol/L   Potassium 3.7 3.5 - 5.1 mmol/L   Chloride 101 98 - 111 mmol/L   CO2 24 22 - 32 mmol/L   Glucose, Bld 131 (H) 70 - 99 mg/dL   BUN 53 (H) 8 - 23 mg/dL   Creatinine, Ser 4.65 (H) 0.61 - 1.24 mg/dL   Calcium 8.8 (L) 8.9 - 10.3 mg/dL    Total Protein 5.8 (L) 6.5 - 8.1 g/dL   Albumin 2.8 (L) 3.5 - 5.0 g/dL   AST 39 15 - 41 U/L   ALT 20 0 - 44 U/L   Alkaline Phosphatase 63 38 - 126 U/L   Total Bilirubin 1.7 (H) 0.3 - 1.2 mg/dL   GFR calc non Af Amer 11 (L) >60 mL/min   GFR calc Af Amer 13 (L) >60 mL/min   Anion gap 13 5 - 15    Comment: Performed at [facility], [place] [address] [place], [place] [address]  CBC WITH DIFFERENTIAL     Status: Abnormal   Collection Time: 02/13/19  2:12 PM  Result Value Ref Range   WBC 12.3 (H) 4.0 - 10.5 K/uL   RBC 4.66 4.22 - 5.81 MIL/uL   Hemoglobin 13.9 13.0 - 17.0 g/dL   HCT 42.2 39.0 - 52.0 %   MCV 90.6 80.0 - 100.0 fL   MCH 29.8 26.0 - 34.0 pg   MCHC 32.9 30.0 - 36.0 g/dL   RDW 12.7 11.5 - 15.5 %   Platelets 117 (L) 150 - 400 K/uL    Comment: REPEATED TO VERIFY PLATELET COUNT CONFIRMED BY SMEAR SPECIMEN CHECKED FOR CLOTS Immature Platelet Fraction may be clinically indicated, consider ordering this additional test [id number]    nRBC 0.0 0.0 - 0.2 %   Neutrophils Relative % 84 %   Neutro Abs 10.4 (H) 1.7 - 7.7 K/uL   Lymphocytes Relative 5 %   Lymphs Abs 0.6 (L) 0.7 - 4.0 K/uL  Monocytes Relative 9 %   Monocytes Absolute 1.1 (H) 0.1 - 1.0 K/uL   Eosinophils Relative 0 %   Eosinophils Absolute 0.0 0.0 - 0.5 K/uL   Basophils Relative 0 %   Basophils Absolute 0.0 0.0 - 0.1 K/uL   WBC Morphology VACUOLATED NEUTROPHILS    Immature Granulocytes 2 %   Abs Immature Granulocytes 0.18 (H) 0.00 - 0.07 K/uL    Comment: Performed at [facility] [address], [place], [place] [address]  APTT     Status: None   Collection Time: 02/13/19  2:12 PM  Result Value Ref Range   aPTT 29 24 - 36 seconds    Comment: Performed at [facility] [address] [place], [place] [address]  Protime-INR     Status: Abnormal   Collection Time: 02/13/19  2:12 PM  Result Value Ref Range   Prothrombin Time 17.1 (H) 11.4 - 15.2 seconds   INR 1.4 (H) 0.8 - 1.2    Comment:  (NOTE) INR goal varies based on device and disease states. Performed at [facility], [place] [address] [place], [place] [address]   Hemoglobin A1c     Status: None   Collection Time: 02/13/19  2:12 PM  Result Value Ref Range   Hgb A1c MFr Bld 5.5 4.8 - 5.6 %    Comment: (NOTE) Pre diabetes:          5.7%-6.4% Diabetes:              >6.4% Glycemic control for   <7.0% adults with diabetes    Mean Plasma Glucose 111.15 mg/dL    Comment: Performed at [facility] [address], [place], [place] [address]  CK     Status: Abnormal   Collection Time: 02/13/19  2:15 PM  Result Value Ref Range   Total CK 1,141 (H) 49 - 397 U/L    Comment: Performed at [facility], [place] [address], [place], [place] [address]    Chemistries  Recent Labs  Lab 02/13/19 1412  NA 138  K 3.7  CL 101  CO2 24  GLUCOSE 131*  BUN 53*  CREATININE 4.65*  CALCIUM 8.8*  AST 39  ALT 20  ALKPHOS 63  BILITOT 1.7*   ------------------------------------------------------------------------------------------------------------------  ------------------------------------------------------------------------------------------------------------------ GFR: Estimated Creatinine Clearance: 14.4 mL/min (A) (by C-G formula based on SCr of 4.65 mg/dL (H)). Liver Function Tests: Recent Labs  Lab 02/13/19 1412  AST 39  ALT 20  ALKPHOS 63  BILITOT 1.7*  PROT 5.8*  ALBUMIN 2.8*   No results for input(s): LIPASE, AMYLASE in the last 168 hours. No results for input(s): AMMONIA in the last 168 hours. Coagulation Profile: Recent Labs  Lab 02/13/19 1412  INR 1.4*   Cardiac Enzymes: Recent Labs  Lab 02/13/19 1415  CKTOTAL 1,141*   BNP (last 3 results) No results for input(s): PROBNP in the last 8760 hours. HbA1C: Recent Labs    02/13/19 1412  HGBA1C 5.5   CBG: No results for input(s): GLUCAP in the last 168 hours. Lipid Profile: No results for input(s): CHOL, HDL, LDLCALC, TRIG,  CHOLHDL, LDLDIRECT in the last 72 hours. Thyroid Function Tests: No results for input(s): TSH, T4TOTAL, FREET4, T3FREE, THYROIDAB in the last 72 hours. Anemia Panel: No results for input(s): VITAMINB12, FOLATE, FERRITIN, TIBC, IRON, RETICCTPCT in the last 72 hours.  --------------------------------------------------------------------------------------------------------------- Urine analysis:    Component Value Date/Time   COLORURINE YELLOW 08/04/2018 [place]  CLEAR 08/04/2018 0949   LABSPEC 1.020 08/04/2018 0949   PHURINE 6.0 08/04/2018 0949   GLUCOSEU NEGATIVE 08/04/2018 0949   HGBUR NEGATIVE 08/04/2018 0949   BILIRUBINUR NEGATIVE 08/04/2018 0949   KETONESUR TRACE (A) 08/04/2018 0949   PROTEINUR NEGATIVE 07/13/2016 2236   UROBILINOGEN 1.0 08/04/2018 0949   NITRITE NEGATIVE 08/04/2018 0949   LEUKOCYTESUR NEGATIVE 08/04/2018 0949      Imaging Results:    Dg Chest Port 1 View  Result Date: 02/13/2019 CLINICAL DATA:  Sepsis EXAM: PORTABLE CHEST 1 VIEW COMPARISON:  07/13/2016 FINDINGS: The heart size and mediastinal contours are within normal limits. Unchanged elevation of the left hemidiaphragm with associated atelectasis or consolidation. The visualized skeletal structures are unremarkable. IMPRESSION: Unchanged elevation of the left hemidiaphragm with associated atelectasis or consolidation. No new or acute appearing airspace opacity. Electronically Signed   By: Eddie Candle M.D.   On: 02/13/2019 15:28    EKG: Normal sinus rhythm at 80,  borderline left axis deviation, normal intervals, early R wave progression, no ST, T-segment changes consistent with acute ischemia  Assessment & Plan:    Principal Problem:   ARF (acute renal failure) (HCC) Active Problems:   Essential hypertension   Dyslipidemia   Sepsis (HCC)   Protein-calorie malnutrition, severe (HCC)   Dementia due to Parkinson's disease without behavioral disturbance (HCC)   Parkinson's disease  ([place])   Acute renal failure Check urine sodium, urine protein, urine creatinine, urine eosinophils Check renal ultrasound, pending Check CPK in a.m. Check SPEP, immunofixation Stop Lotrel, stop Lasix Foley catheter to gravity drainage Hydrate with normal saline  Mild rhabdomyolysis Likely secondary to dehydration Hydrate with normal saline as above Check CMP, CPK in a.m.  Sepsis, unclear source, awaiting urinalysis Blood cultures x2 pending Urine culture pending Vanco IV x1 given in ED, hold off on further Vanco and await urinalysis results due to acute renal failure Continue cefepime as per pharmacy  Severe protein calorie malnutrition Protostat 30 mL's p.o. twice daily  Parkinson's disease Speech therapy to evaluate swallowing I spoke with neurology , they recommended starting low dose trial of sinemet and outpatient follow up  Dementia secondary to Parkinson's disease MRI Brain 11/23/2018 IMPRESSION: No acute or reversible finding. Atrophy of the cerebral hemispheres with temporal lobe predominance. Mild moderate chronic small-vessel change of the white matter. DC Trazodone due to somnolence  Thrombocytopenia (mild) Check LDH if high then consider calling oncology r/o TTP Check cbc in Am  DVT Prophylaxis-   Heparin - SCDs   AM Labs Ordered, also please review Full Orders  Family Communication: Admission, patients condition and plan of care including tests being ordered have been discussed with the patient and who indicate understanding and agree with the plan and Code Status.  Code Status:  FULL CODE, patient is w Nani Gasser  Admission status:   Inpatient: Based on patients clinical presentation and evaluation of above clinical data, I have made determination that patient meets Inpatient criteria at this time.    Time spent in minutes : 70   Jani Gravel M.D on 02/13/2019 at 5:51 PM

## 2019-02-13 NOTE — Progress Notes (Signed)
Notified bedside nurse of need to draw repeat lactic acid. Nurse stated that patient is in a procedure at this time.

## 2019-02-13 NOTE — ED Notes (Signed)
Per lab able to add on CK to previous labwork 

## 2019-02-13 NOTE — Progress Notes (Signed)
Pharmacy Antibiotic Note  [name] is a 78 y.o. male admitted on 02/13/2019 with sepsis.  Pharmacy has been consulted for vancomycin and cefepime dosing. Pt is febrile with Tmax 101 and WBC is pending. SCr is well above his baseline at 4.65. Lactic acid is elevated at 2.   Plan: Vancomycin 1750mg  IV x 1 then trend Scr for further doses Cefepime 2gm IV Q24H F/u renal fxn, C&S, clinical status and peak/trough at SS  Height: 5\' 10"  (177.8 cm) Weight: 195 lb (88.5 kg) IBW/kg (Calculated) : 73  Temp (24hrs), Avg:100.6 F (38.1 C), Min:100.2 F (37.9 C), Max:101 F (38.3 C)  Recent Labs  Lab 02/13/19 1412  CREATININE 4.65*  LATICACIDVEN 2.0*    Estimated Creatinine Clearance: 14.4 mL/min (A) (by C-G formula based on SCr of 4.65 mg/dL (H)).    Allergies  Allergen Reactions  . Amlodipine     Edema w/10 mg  . Atorvastatin     REACTION: leg cramps  . Penicillins Hives    Has patient had a PCN reaction causing immediate rash, facial/tongue/throat swelling, SOB or lightheadedness with hypotension: Yes Has patient had a PCN reaction causing severe rash involving mucus membranes or skin necrosis: No Has patient had a PCN reaction that required hospitalization No Has patient had a PCN reaction occurring within the last 10 years: No If all of the above answers are "NO", then may proceed with Cephalosporin use.   . Sulfonamide Derivatives Hives  . Telmisartan     ?fatigue  . Sulfa Antibiotics Other (See Comments) and Palpitations    Sweating, passed out    Antimicrobials this admission: Vanc 8/22>> Cefepime 8/22>> Flagyl x 1 8/22  Dose adjustments this admission: N/A  Microbiology results: Pending  Thank you for allowing pharmacy to be a part of this patient's care.  Tamekia Rotter, Rande Lawman 02/13/2019 2:07 PM

## 2019-02-13 NOTE — ED Triage Notes (Signed)
Pt bib ems with reports of ams for since Wednesday. Lives with family. Normally a and o X4 and ambulatory. over the last few days pt has been getting weaker since Wednesday. Alert to self. Pt had not moved from sofa in several days Cap 25-30 74/40 manual  HR 84 NSR 101F CBG 142 RR26

## 2019-02-13 NOTE — ED Provider Notes (Signed)
[facility] Provider Note   CSN: [id number] Arrival date & time: 02/13/19  1341     History   Chief Complaint Chief Complaint  Patient presents with  . Altered Mental Status    HPI [name] is a 78 y.o. male with history of hypertension, hyperlipidemia, recent diagnosis of Parkinson's disease who presents with altered mental status.  Patient has been on the couch for the past 4 days.  He has not been acting his normal self.  He ate something yesterday, but has had a decreased appetite.  His wife noted a fever last evening.  Patient has had some hiccuping at home, but patient's wife reports no cough or notable shortness of breath.  He has not had any vomiting or diarrhea.  No known sick contacts.  He lives with his wife and has not left the house.     HPI  Past Medical History:  Diagnosis Date  . Benign prostatic hypertrophy   . Edema   . Hyperlipidemia   . Hypertension   . Insomnia   . Parasomnia 10/14/2011   PSG 12/15/11>>AHI 0.4, SpO2 low 89%, PLMI 16, somnoliloquay, nocturia   . Seasonal allergies   . UTI (lower urinary tract infection)     Patient Active Problem List   Diagnosis Date Noted  . Infected sebaceous cyst 12/04/2018  . Hallucinations 11/19/2018  . Shoulder pain 11/19/2018  . Vitamin B12 deficiency 11/19/2018  . 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 Memory loss 09/21/2018  . Falls frequently 09/21/2018  . Fatigue 10/27/2017  . Edema 07/29/2017  . Chronic venous insufficiency 06/30/2017  . Cellulitis and abscess of leg, except foot 06/30/2017  . Ear pain, referred, right 06/10/2016  . Urinary frequency 04/16/2016  . Cerumen impaction 08/22/2015  . Infection of right hand due to bite 04/20/2014  . Hemifacial spasm 09/15/2013  . Well adult exam 07/16/2013  . Dermatochalasis of eyelid 01/01/2012  . Other facial nerve disorders 01/01/2012  . 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 Twitching 11/04/2011  .  Non-REM Parasomnia 10/14/2011  . Dog bite of left hand 08/19/2011  . Wound infection 08/19/2011  . Hypokalemia 12/10/2010  . Hyperglycemia 12/10/2010  . Dyslipidemia   . Benign prostatic hyperplasia   . LEG CRAMPS 03/07/2009  . OTITIS EXTERNA 08/30/2008  . Prostatitis 08/30/2008  . Essential hypertension 08/26/2007  . ERECTILE DYSFUNCTION 08/26/2007    Past Surgical History:  Procedure Laterality Date  . FINGER SURGERY Right 04/19/2014   RIGHT INDEX & RING FINGER   FROM DOG BITE   . I&D EXTREMITY Right 04/20/2014   Procedure: IRRIGATION AND DEBRIDEMENT right index finger and right ring finger;  Surgeon: Dominica SeverinWilliam Gramig, MD;  [facility];  Service: Orthopedics;  Laterality: Right;  . I&D EXTREMITY Right 04/21/2014   Procedure: IRRIGATION AND DEBRIDEMENT RIGHT HAND RING FINGER, INDEX FINGER AND Flexor Tenolysis and FDS Tenotomy;  Surgeon: Dominica SeverinWilliam Gramig, MD;  Location: [facility] OR;  Service: Orthopedics;  Laterality: Right;        Home Medications    Prior to Admission medications   Medication Sig Start Date End Date Taking? Authorizing Provider  amLODipine-benazepril (LOTREL) 5-20 MG capsule Take 1 capsule by mouth daily. 08/04/18  Yes Plotnikov, Georgina QuintAleksei V, MD  Cholecalciferol (VITAMIN D) 1000 UNITS capsule Take 1 capsule (1,000 Units total) by mouth daily. 09/09/12  Yes Plotnikov, Georgina QuintAleksei V, MD  cyanocobalamin (,VITAMIN B-12,) 1000 MCG/ML injection 1 ml sq for 5 days, then 1 ml weekly  for 4 weeks, then 1 ml every 2 weeks 11/19/18  Yes Plotnikov, Georgina QuintAleksei V, MD  furosemide (LASIX) 20 MG tablet TAKE 1 TABLET (20 MG TOTAL) BY MOUTH DAILY AS NEEDED FOR EDEMA. 02/03/19  Yes Plotnikov, Georgina QuintAleksei V, MD  potassium chloride (KLOR-CON) 8 MEQ tablet Take 1 tablet (8 mEq total) by mouth daily. 08/04/18  Yes Plotnikov, Georgina QuintAleksei V, MD  traZODone (DESYREL) 50 MG tablet TAKE 2 TABLETS BY MOUTH EVERY DAY AT BEDTIME Patient taking differently: Take 100 mg by mouth at bedtime.  12/09/18  Yes Van ClinesAquino, Karen M, MD   SYRINGE-NEEDLE, DISP, 3 ML (BD ECLIPSE SYRINGE) 25G X 1" 3 ML MISC Use sq as directed 11/19/18   Plotnikov, Georgina QuintAleksei V, MD    Family History Family History  Problem Relation Age of Onset  . Diabetes Mother   . Diabetes Other     Social History Social History   Tobacco Use  . Smoking status: Former Smoker    Packs/day: 1.00    Years: 7.00    Pack years: 7.00    Types: Cigarettes, Cigars  . Smokeless tobacco: Never Used  . Tobacco comment: cigars day/ 1-2       QIUIT SMOKING IN 2000  Substance Use Topics  . Alcohol use: Not Currently    Comment: OCCASSIONAL WINE -1 glass/month  . Drug use: No     Allergies   Amlodipine, Atorvastatin, Penicillins, Sulfonamide derivatives, Telmisartan, and Sulfa antibiotics   Review of Systems Review of Systems  Unable to perform ROS: Mental status change     Physical Exam Updated Vital Signs BP 121/68   Pulse 84   Temp 100.2 F (37.9 C) (Rectal)   Resp (!) 24   Ht 5\' 10"  (1.778 m)   Wt 88.5 kg   SpO2 97%   BMI 27.98 kg/m   Physical Exam Vitals signs and nursing note reviewed.  Constitutional:      General: He is not in acute distress.    Appearance: He is well-developed. He is diaphoretic.     Comments: Febrile  HENT:     Head: Normocephalic and atraumatic.     Mouth/Throat:     Pharynx: No oropharyngeal exudate.  Eyes:     General: No scleral icterus.       Right eye: No discharge.        Left eye: No discharge.     Conjunctiva/sclera: Conjunctivae normal.     Pupils: Pupils are equal, round, and reactive to light.  Neck:     Musculoskeletal: Normal range of motion and neck supple.     Thyroid: No thyromegaly.  Cardiovascular:     Rate and Rhythm: Normal rate and regular rhythm.     Heart sounds: Normal heart sounds. No murmur. No friction rub. No gallop.   Pulmonary:     Effort: Pulmonary effort is normal. No respiratory distress.     Breath sounds: Normal breath sounds. No stridor. No wheezing or rales.   Abdominal:     General: Bowel sounds are normal. There is no distension.     Palpations: Abdomen is soft.     Tenderness: There is no abdominal tenderness. There is no guarding or rebound.  Lymphadenopathy:     Cervical: No cervical adenopathy.  Skin:    General: Skin is warm.     Coloration: Skin is not pale.     Findings: No rash.  Neurological:     Mental Status: He is alert.     Coordination: Coordination  normal.     Comments: Delirium; patient unable to answer questions, does not open eyes      ED Treatments / Results  Labs (all labs ordered are listed, but only abnormal results are displayed) Labs Reviewed  LACTIC ACID, PLASMA - Abnormal; Notable for the following components:      Result Value   Lactic Acid, Venous 2.0 (*)    All other components within normal limits  COMPREHENSIVE METABOLIC PANEL - Abnormal; Notable for the following components:   Glucose, Bld 131 (*)    BUN 53 (*)    Creatinine, Ser 4.65 (*)    Calcium 8.8 (*)    Total Protein 5.8 (*)    Albumin 2.8 (*)    Total Bilirubin 1.7 (*)    GFR calc non Af Amer 11 (*)    GFR calc Af Amer 13 (*)    All other components within normal limits  CBC WITH DIFFERENTIAL/PLATELET - Abnormal; Notable for the following components:   WBC 12.3 (*)    Platelets 117 (*)    Neutro Abs 10.4 (*)    Lymphs Abs 0.6 (*)    Monocytes Absolute 1.1 (*)    Abs Immature Granulocytes 0.18 (*)    All other components within normal limits  PROTIME-INR - Abnormal; Notable for the following components:   Prothrombin Time 17.1 (*)    INR 1.4 (*)    All other components within normal limits  CULTURE, BLOOD (ROUTINE X 2)  CULTURE, BLOOD (ROUTINE X 2)  URINE CULTURE  NOVEL CORONAVIRUS, NAA (HOSPITAL ORDER, SEND-OUT TO REF LAB)  APTT  LACTIC ACID, PLASMA  URINALYSIS, ROUTINE W REFLEX MICROSCOPIC  CK    EKG EKG Interpretation  Date/Time:  Saturday February 13 2019 14:20:16 EDT Ventricular Rate:  81 PR Interval:    QRS  Duration: 93 QT Interval:  350 QTC Calculation: 407 R Axis:   -17 Text Interpretation:  Sinus rhythm Borderline left axis deviation Confirmed by Lajean Saver [phone]) on 02/13/2019 3:22:51 PM   Radiology Dg Chest Port 1 View  Result Date: 02/13/2019 CLINICAL DATA:  Sepsis EXAM: PORTABLE CHEST 1 VIEW COMPARISON:  07/13/2016 FINDINGS: The heart size and mediastinal contours are within normal limits. Unchanged elevation of the left hemidiaphragm with associated atelectasis or consolidation. The visualized skeletal structures are unremarkable. IMPRESSION: Unchanged elevation of the left hemidiaphragm with associated atelectasis or consolidation. No new or acute appearing airspace opacity. Electronically Signed   By: Eddie Candle M.D.   On: 02/13/2019 15:28    Procedures .Critical Care Performed by: Frederica Kuster, PA-C Authorized by: Frederica Kuster, PA-C   Critical care provider statement:    Critical care time (minutes):  45   Critical care was necessary to treat or prevent imminent or life-threatening deterioration of the following conditions:  Sepsis, renal failure and dehydration   Critical care was time spent personally by me on the following activities:  Discussions with consultants, evaluation of patient's response to treatment, examination of patient, ordering and performing treatments and interventions, ordering and review of laboratory studies, ordering and review of radiographic studies, pulse oximetry, re-evaluation of patient's condition, obtaining history from patient or surrogate and review of old charts   I assumed direction of critical care for this patient from another provider in my specialty: no     (including critical care time)  Medications Ordered in ED Medications  sodium chloride 0.9 % bolus 1,000 mL (1,000 mLs Intravenous New Bag/Given 02/13/19 1421)    And  sodium chloride 0.9 % bolus 1,000 mL (0 mLs Intravenous Stopped 02/13/19 1616)    And  sodium chloride  0.9 % bolus 1,000 mL (has no administration in time range)  vancomycin (VANCOCIN) 1,750 mg in sodium chloride 0.9 % 500 mL IVPB (has no administration in time range)  vancomycin variable dose per unstable renal function (pharmacist dosing) (has no administration in time range)  ceFEPIme (MAXIPIME) 2 g in sodium chloride 0.9 % 100 mL IVPB (has no administration in time range)  ceFEPIme (MAXIPIME) 2 g in sodium chloride 0.9 % 100 mL IVPB (2 g Intravenous New Bag/Given 02/13/19 1456)  metroNIDAZOLE (FLAGYL) IVPB 500 mg (500 mg Intravenous New Bag/Given 02/13/19 1502)  acetaminophen (TYLENOL) suppository 650 mg (650 mg Rectal Given 02/13/19 1502)     Initial Impression / Assessment and Plan / ED Course  I have reviewed the triage vital signs and the nursing notes.  Pertinent labs & imaging results that were available during my care of the patient were reviewed by me and considered in my medical decision making (see chart for details).        Patient presenting with sepsis of unknown origin.  He was hypotensive and febrile.  He is found to have AKI.  Weight-based fluids and broad-spectrum antibiotics initiated after evaluation.  Patient with leukocytosis of 12.3.  Lactic 2.00.  Patient has elevation in creatinine at 4.65 and BUN 53.  Chest x-ray is negative for acute findings.  Urine is pending.  Question urine as cause as patient recently started wearing Depends.  No nausea, vomiting, diarrhea, or abdominal tenderness.  Will hold off on abdominal imaging at this time until urine returns.  I discussed patient case [facility]. Patient also evaluated by my attending, Dr. Denton LankSteinl, who guided the patient's management and agrees with plan.   Final Clinical Impressions(s) / ED Diagnoses   Final diagnoses:  Delirium  AKI (acute kidney injury) (HCC)  Sepsis with acute renal failure and septic shock, due to unspecified organism, unspecified acute renal failure  type [facility](HCC)    ED Discharge Orders    None       Emi HolesLaw, Darlisha Kelm M, PA-C 02/13/19 1630    Cathren LaineSteinl, Kevin, MD 02/14/19 [id number]

## 2019-02-14 DIAGNOSIS — L8990 Pressure ulcer of unspecified site, unspecified stage: Secondary | ICD-10-CM | POA: Insufficient documentation

## 2019-02-14 DIAGNOSIS — L89159 Pressure ulcer of sacral region, unspecified stage: Secondary | ICD-10-CM

## 2019-02-14 DIAGNOSIS — E785 Hyperlipidemia, unspecified: Secondary | ICD-10-CM

## 2019-02-14 LAB — BLOOD CULTURE ID PANEL (REFLEXED)

## 2019-02-14 LAB — COMPREHENSIVE METABOLIC PANEL
ALT: 18 U/L (ref 0–44)
AST: 43 U/L — ABNORMAL HIGH (ref 15–41)
Albumin: 2.3 g/dL — ABNORMAL LOW (ref 3.5–5.0)
Alkaline Phosphatase: 53 U/L (ref 38–126)
Anion gap: 12 (ref 5–15)
BUN: 54 mg/dL — ABNORMAL HIGH (ref 8–23)
CO2: 18 mmol/L — ABNORMAL LOW (ref 22–32)
Calcium: 8.3 mg/dL — ABNORMAL LOW (ref 8.9–10.3)
Chloride: 109 mmol/L (ref 98–111)
Creatinine, Ser: 3.37 mg/dL — ABNORMAL HIGH (ref 0.61–1.24)
GFR calc Af Amer: 19 mL/min — ABNORMAL LOW (ref >60)
GFR calc non Af Amer: 16 mL/min — ABNORMAL LOW (ref >60)
Glucose, Bld: 121 mg/dL — ABNORMAL HIGH (ref 70–99)
Potassium: 3.3 mmol/L — ABNORMAL LOW (ref 3.5–5.1)
Sodium: 139 mmol/L (ref 135–145)
Total Bilirubin: 1.3 mg/dL — ABNORMAL HIGH (ref 0.3–1.2)
Total Protein: 5.2 g/dL — ABNORMAL LOW (ref 6.5–8.1)

## 2019-02-14 LAB — CK TOTAL AND CKMB (NOT AT ARMC)
CK, MB: 18 ng/mL — ABNORMAL HIGH (ref 0.5–5.0)
Relative Index: 1.9 (ref 0.0–2.5)
Total CK: 937 U/L — ABNORMAL HIGH (ref 49–397)

## 2019-02-14 LAB — CBC
HCT: 38.3 % — ABNORMAL LOW (ref 39.0–52.0)
Hemoglobin: 13.3 g/dL (ref 13.0–17.0)
MCH: 30 pg (ref 26.0–34.0)
MCHC: 34.7 g/dL (ref 30.0–36.0)
MCV: 86.5 fL (ref 80.0–100.0)
Platelets: 97 10*3/uL — ABNORMAL LOW (ref 150–400)
RBC: 4.43 MIL/uL (ref 4.22–5.81)
RDW: 12.3 % (ref 11.5–15.5)
WBC: 9.6 10*3/uL (ref 4.0–10.5)
nRBC: 0 % (ref 0.0–0.2)

## 2019-02-14 LAB — NOVEL CORONAVIRUS, NAA (HOSP ORDER, SEND-OUT TO REF LAB; TAT 18-24 HRS): SARS-CoV-2, NAA: NOT DETECTED

## 2019-02-14 MED ORDER — POTASSIUM CHLORIDE CRYS ER 20 MEQ PO TBCR
40.0000 meq | EXTENDED_RELEASE_TABLET | Freq: Once | ORAL | Status: AC
  Administered 2019-02-14: 40 meq via ORAL
  Filled 2019-02-14: qty 2

## 2019-02-14 MED ORDER — SODIUM CHLORIDE 0.9 % IV SOLN
2.0000 g | INTRAVENOUS | Status: DC
  Administered 2019-02-14 – 2019-02-15 (×2): 2 g via INTRAVENOUS
  Filled 2019-02-14 (×2): qty 2
  Filled 2019-02-14: qty 20

## 2019-02-14 NOTE — Progress Notes (Signed)
PROGRESS NOTE  [name] MPN:[id number] DOB: [birth year] DOA: 02/13/2019 PCP: Larry Anger, MD  Brief History   [name]  is a 78 y.o. male,   w hypertension, hyperlipidemia, w new diagnosis of parkinsons dementia apparently presents with c/o decrease oral intake since Wednesday.  Pt started to have fever yesterday am per his wife.  Wife is providing the history .  Pt is only axox1 (person), not place or time.  Pt making urine.  No complaints of dysuria. Might have had increase in urinary frequency.  Per wife, no fever, chills, no cp, no palp, no sob, no n/v, no abd pain, no diarrhea, no brbpr, no hematuria, no flank pain.  Pt brought to ED due to fever.   In the ED he was found to have acute renal failure with mild rhabdomyolysis.   [facility] were consulted to admit the patient for further evaluation and treatment. This was likely due to sepsis of unknown source. Blood cultures were obtained which have since grown out due klebsiella pneumonia. He has been started on IV ceftriaxone.  Consultants  . None  Procedures  . None  Antibiotics   Anti-infectives (From admission, onward)   Start     Dose/Rate Route Frequency Ordered Stop   02/14/19 1500  ceFEPIme (MAXIPIME) 2 g in sodium chloride 0.9 % 100 mL IVPB  Status:  Discontinued     2 g 200 mL/hr over 30 Minutes Intravenous Every 24 hours 02/13/19 1512 02/14/19 1028   02/14/19 1200  cefTRIAXone (ROCEPHIN) 2 g in sodium chloride 0.9 % 100 mL IVPB     2 g 200 mL/hr over 30 Minutes Intravenous Every 24 hours 02/14/19 1028     02/13/19 1511  vancomycin variable dose per unstable renal function (pharmacist dosing)  Status:  Discontinued      Does not apply See admin instructions 02/13/19 1512 02/14/19 1028   02/13/19 1415  ceFEPIme (MAXIPIME) 2 g in sodium chloride 0.9 % 100 mL IVPB     2 g 200 mL/hr over 30 Minutes Intravenous  Once 02/13/19 1404 02/13/19 1633   02/13/19 1415  metroNIDAZOLE (FLAGYL) IVPB 500 mg      500 mg 100 mL/hr over 60 Minutes Intravenous  Once 02/13/19 1404 02/13/19 1633   02/13/19 1415  vancomycin (VANCOCIN) IVPB 1000 mg/200 mL premix  Status:  Discontinued     1,000 mg 200 mL/hr over 60 Minutes Intravenous  Once 02/13/19 1404 02/13/19 1407   02/13/19 1415  vancomycin (VANCOCIN) 1,750 mg in sodium chloride 0.9 % 500 mL IVPB  Status:  Discontinued     1,750 mg 250 mL/hr over 120 Minutes Intravenous  Once 02/13/19 1407 02/14/19 1028    .  [name]   Subjective  The patient is lying quietly in bed with his eyes closed. He is verbally communicative to voice. His wife is at his bedside.  Objective   Vitals:  Vitals:   02/14/19 0639 02/14/19 1421  BP: (!) 165/90 (!) 154/80  Pulse: 93 85  Resp: 16   Temp: 98.1 F (36.7 C) 99.9 F (37.7 C)  SpO2: 95% 96%    Exam:  Constitutional:  . The patient is lethargic, but verbally responsive to stimulation. He states that he is feeling better.  Respiratory:  . There is no increased work of breathing. . No wheezes, rales, or rhonchi . No tactile fremitus. Cardiovascular:  . Regular rate and rhythm. . No murmurs, ectopy or gallups. . No lateral PMI. No thrills.  Abdomen:  . Abdomen is soft, non-tender, non-distended . No hernias, masses, or hernias are appreciated. . Normoactive bowel sounds.  Musculoskeletal:  . No cyanosis, clubbing, or edema. Skin:  . No rashes or lesions . Positve for pressure ulcer on sacrum . palpation of skin: no induration or nodules Neurologic:  . CN 2-12 intact . Sensation all 4 extremities intact Psychiatric:  . Unable to evaluate due to the patient's inability to participate in exam.  I have personally reviewed the following:   Today's Data  . Vitals, CMP, CBC, Lactic acid, CK  Micro Data  . Blood cultures.  Scheduled Meds: . carbidopa-levodopa  0.5 tablet Oral TID  . heparin  5,000 Units Subcutaneous Q8H   Continuous Infusions: . cefTRIAXone (ROCEPHIN)  IV 2 g (02/14/19 1232)     Principal Problem:   ARF (acute renal failure) (HCC) Active Problems:   Essential hypertension   Dyslipidemia   Sepsis (HCC)   Protein-calorie malnutrition, severe (HCC)   Dementia due to Parkinson's disease without behavioral disturbance (HCC)   Parkinson's disease (HCC)   Pressure injury of skin   LOS: 1 day   A & P   Acute renal failure: Creatinine is down from 4.65 to 3.37. Likely of mixed etiology -pre-renal and rhabdomyolysis.. Renal ultrasound was negative for hydronephrosis. CK trending downward. Continue cautious IV fluids. Avoid nephrotoxic substances and hypotension. SPEP, immunofixation, are pending. Lotrel and lasix are being held. The patient has a foley catheter for accurate urine collection and quantification.  Mild rhabdomyolysis: Due to sepsis. Hydrate, monitor, CK is trending downward. Monitor.  Sepsis: Based upon fever, hypotension, lactic acid of 2.0, leukocytosis, and tachycardia on admission. Now resolving.   Gram negative bacteremia: 1/2 blood cultures drawn on admission has grown out klebsiella pneumonia. The patient has been started on ceftriaxone pending sensitivities.  Severe protein calorie malnutrition: Consult nutrition. Protostat 30 mL's p.o. twice daily.  Parkinson's disease: Chronic. Speech therapy to evaluate swallowing. I spoke with neurology , they recommended starting low dose trial of sinemet and outpatient follow up.  Dementia secondary to Parkinson's disease:  MRI Brain 11/23/2018 IMPRESSION: No acute or reversible finding. Atrophy of the cerebral hemispheres with temporal lobe predominance. Mild moderate chronic small-vessel change of the white matter. DC Trazodone due to somnolence  Thrombocytopenia (mild): Check LDH if high then consider calling oncology r/o TTP. Follow platelets. Probably due to sepsis.  I have seen and examined this patient myself. I have spent 42 minutes in his evaluation and care.  DVT Prophylaxis:     Heparin and SCDs  Family Communication: Patient wife at bedside. Code Status:  FULL CODE, patient is w [name] Disposition: tbd  [name], DO [facility] Direct contact: see [email or web address]  7PM-7AM contact night coverage as above 02/14/2019, 6:44 PM  LOS: 1 day

## 2019-02-14 NOTE — Progress Notes (Signed)
PHARMACY - PHYSICIAN COMMUNICATION CRITICAL VALUE ALERT - BLOOD CULTURE IDENTIFICATION (BCID)  [name] is an 78 y.o. male who presented to [facility] on 02/13/2019   Assessment:  BCID Klebsiella  Name of physician (or Provider) Contacted: Dr Benny Lennert  Current antibiotics: Cefepime Vanc  Changes to prescribed antibiotics recommended:  DC above Ceftriaxone 2 g q24h  Results for orders placed or performed during the hospital encounter of 02/13/19  Blood Culture ID Panel (Reflexed) (Collected: 02/13/2019  2:45 PM)  Result Value Ref Range   Enterococcus species NOT DETECTED NOT DETECTED   Listeria monocytogenes NOT DETECTED NOT DETECTED   Staphylococcus species NOT DETECTED NOT DETECTED   Staphylococcus aureus (BCID) NOT DETECTED NOT DETECTED   Streptococcus species NOT DETECTED NOT DETECTED   Streptococcus agalactiae NOT DETECTED NOT DETECTED   Streptococcus pneumoniae NOT DETECTED NOT DETECTED   Streptococcus pyogenes NOT DETECTED NOT DETECTED   Acinetobacter baumannii NOT DETECTED NOT DETECTED   Enterobacteriaceae species DETECTED (A) NOT DETECTED   Enterobacter cloacae complex NOT DETECTED NOT DETECTED   Escherichia coli NOT DETECTED NOT DETECTED   Klebsiella oxytoca NOT DETECTED NOT DETECTED   Klebsiella pneumoniae DETECTED (A) NOT DETECTED   Proteus species NOT DETECTED NOT DETECTED   Serratia marcescens NOT DETECTED NOT DETECTED   Carbapenem resistance NOT DETECTED NOT DETECTED   Haemophilus influenzae NOT DETECTED NOT DETECTED   Neisseria meningitidis NOT DETECTED NOT DETECTED   Pseudomonas aeruginosa NOT DETECTED NOT DETECTED   Candida albicans NOT DETECTED NOT DETECTED   Candida glabrata NOT DETECTED NOT DETECTED   Candida krusei NOT DETECTED NOT DETECTED   Candida parapsilosis NOT DETECTED NOT DETECTED   Candida tropicalis NOT DETECTED NOT DETECTED   Levester Fresh, PharmD, BCPS, BCCCP Clinical Pharmacist [phone]  Please check AMION for all [place]  numbers  02/14/2019 10:30 AM

## 2019-02-14 NOTE — Plan of Care (Signed)

## 2019-02-15 ENCOUNTER — Ambulatory Visit: Payer: Medicare Other | Admitting: Internal Medicine

## 2019-02-15 LAB — URINALYSIS, ROUTINE W REFLEX MICROSCOPIC
Bacteria, UA: NONE SEEN
Bilirubin Urine: NEGATIVE
Glucose, UA: NEGATIVE mg/dL
Ketones, ur: NEGATIVE mg/dL
Nitrite: NEGATIVE
Protein, ur: 100 mg/dL — AB
Specific Gravity, Urine: 1.015 (ref 1.005–1.030)
pH: 6 (ref 5.0–8.0)

## 2019-02-15 LAB — COMPREHENSIVE METABOLIC PANEL
ALT: 17 U/L (ref 0–44)
AST: 21 U/L (ref 15–41)
Albumin: 2 g/dL — ABNORMAL LOW (ref 3.5–5.0)
Alkaline Phosphatase: 47 U/L (ref 38–126)
Anion gap: 9 (ref 5–15)
BUN: 53 mg/dL — ABNORMAL HIGH (ref 8–23)
CO2: 19 mmol/L — ABNORMAL LOW (ref 22–32)
Calcium: 8.5 mg/dL — ABNORMAL LOW (ref 8.9–10.3)
Chloride: 114 mmol/L — ABNORMAL HIGH (ref 98–111)
Creatinine, Ser: 2.85 mg/dL — ABNORMAL HIGH (ref 0.61–1.24)
GFR calc Af Amer: 23 mL/min — ABNORMAL LOW (ref >60)
GFR calc non Af Amer: 20 mL/min — ABNORMAL LOW (ref >60)
Glucose, Bld: 106 mg/dL — ABNORMAL HIGH (ref 70–99)
Potassium: 3.6 mmol/L (ref 3.5–5.1)
Sodium: 142 mmol/L (ref 135–145)
Total Bilirubin: 1.2 mg/dL (ref 0.3–1.2)
Total Protein: 4.8 g/dL — ABNORMAL LOW (ref 6.5–8.1)

## 2019-02-15 LAB — CK: Total CK: 264 U/L (ref 49–397)

## 2019-02-15 MED ORDER — HYDRALAZINE HCL 20 MG/ML IJ SOLN
5.0000 mg | Freq: Once | INTRAMUSCULAR | Status: AC | PRN
  Administered 2019-02-15: 5 mg via INTRAVENOUS
  Filled 2019-02-15: qty 1

## 2019-02-15 NOTE — Plan of Care (Signed)
  Problem: Education: Goal: Knowledge of General Education information will improve Description: Including pain rating scale, medication(s)/side effects and non-pharmacologic comfort measures Outcome: Progressing   Problem: Health Behavior/Discharge Planning: Goal: Ability to manage health-related needs will improve Outcome: Progressing   Problem: Clinical Measurements: Goal: Ability to maintain clinical measurements within normal limits will improve Outcome: Progressing Goal: Respiratory complications will improve Outcome: Progressing Goal: Cardiovascular complication will be avoided Outcome: Progressing   Problem: Nutrition: Goal: Adequate nutrition will be maintained Outcome: Progressing   Problem: Elimination: Goal: Will not experience complications related to urinary retention Outcome: Progressing   Problem: Pain Managment: Goal: General experience of comfort will improve Outcome: Progressing   Problem: Safety: Goal: Ability to remain free from injury will improve Outcome: Progressing   Problem: Skin Integrity: Goal: Risk for impaired skin integrity will decrease Outcome: Progressing   

## 2019-02-15 NOTE — Progress Notes (Signed)
Occupational Therapy Evaluation Patient Details Name: [name] MRN: [id number] DOB: 04/19/1941 Today's Date: 02/15/2019    History of Present Illness [name]  is a 78 y.o. male,   w hypertension, hyperlipidemia, w new diagnosis of parkinsons dementia apparently presents with c/o decrease oral intake since Wednesday. In the ED he was found to have acute renal failure with mild rhabdomyolysis.    Clinical Impression   PTA, pt was living at home with his wife, and pt reports his wife assisted with ADL/IADL. Pt appears to be a poor historian, unable to contact family at this time. Pt currently requires maxA for bed mobility, totalA for LB ADL. Pt required modA to maintain stability sitting EOB. Due to cognitive limitations and physical limitations listed below, pt would benefit from acute OT to address established goals to facilitate safe D/C to venue listed below. At this time, recommend SNF follow-up. Will continue to follow acutely.     Follow Up Recommendations  SNF    Equipment Recommendations  3 in 1 bedside commode    Recommendations for Other Services       Precautions / Restrictions Precautions Precautions: Fall Restrictions Weight Bearing Restrictions: No      Mobility Bed Mobility Overal bed mobility: Needs Assistance Bed Mobility: Supine to Sit;Sit to Supine     Supine to sit: Max assist;HOB elevated Sit to supine: Max assist;HOB elevated   General bed mobility comments: maxA for BLE management and trunk management;modA to maintain balance sitting EOB and progress hips to EOb  Transfers                 General transfer comment: deferred due to safety    Balance Overall balance assessment: Needs assistance Sitting-balance support: Bilateral upper extremity supported;Feet supported Sitting balance-Leahy Scale: Poor Sitting balance - Comments: reliant on BUE support and modA from therapist to maintain upright position Postural control: Posterior  lean                                 ADL either performed or assessed with clinical judgement   ADL Overall ADL's : Needs assistance/impaired Eating/Feeding: Minimal assistance;Sitting   Grooming: Minimal assistance;Sitting Grooming Details (indicate cue type and reason): minA for support at elbow to wash face  Upper Body Bathing: Maximal assistance   Lower Body Bathing: Total assistance   Upper Body Dressing : Maximal assistance   Lower Body Dressing: Total assistance   Toilet Transfer: Total assistance   Toileting- Clothing Manipulation and Hygiene: Total assistance         General ADL Comments: limited mobility this session due to congition;pt sat EOB with modA to maintain stability, posterior lean noted     Vision   Vision Assessment?: Vision impaired- to be further tested in functional context Additional Comments: further evaluate;pt kept eye shut majority of session;max vc to open eye;eye mucous noted on eyelid of R eye;pt reports burning sensation in eyes     Perception     Praxis      Pertinent Vitals/Pain Pain Assessment: No/denies pain     Hand Dominance Right   Extremity/Trunk Assessment Upper Extremity Assessment Upper Extremity Assessment: Generalized weakness;RUE deficits/detail;LUE deficits/detail RUE Deficits / Details: noted increased edema;educated pt on importance of elevating UE and keeping UE mobile;limited AROM in elbow flexion and shoulder flexion, required increased time to engage in AROM;poor grip strength, due to swelling unable to fully enclose grasp LUE Deficits /  Details: same as RUE   Lower Extremity Assessment Lower Extremity Assessment: Defer to PT evaluation   Cervical / Trunk Assessment Cervical / Trunk Assessment: Kyphotic   Communication Communication Communication: No difficulties   Cognition Arousal/Alertness: Lethargic Behavior During Therapy: Flat affect Overall Cognitive Status: Impaired/Different from  baseline Area of Impairment: Orientation;Attention;Memory;Following commands;Safety/judgement;Awareness;Problem solving                 Orientation Level: Disoriented to;Person;Place;Time;Situation Current Attention Level: Focused Memory: Decreased recall of precautions;Decreased short-term memory Following Commands: Follows one step commands inconsistently Safety/Judgement: Decreased awareness of safety;Decreased awareness of deficits Awareness: Intellectual Problem Solving: Slow processing;Decreased initiation;Difficulty sequencing;Requires verbal cues;Requires tactile cues General Comments: pt unable to provide birthdate;when asked our location he replied "we're here where we go out";pt kept eye shut majority of session, required max vc to open eyes   General Comments       Exercises     Shoulder Instructions      Home Living Family/patient expects to be discharged to:: Private residence Living Arrangements: Spouse/significant other Available Help at Discharge: Family Type of Home: House Home Access: Level entry     Home Layout: One level                   Additional Comments: history per pt, unsure reliability of pt as historian due to cognitive limitations noted this session (see cognition section)      Prior Functioning/Environment Level of Independence: Needs assistance    ADL's / Homemaking Assistance Needed: pt reports wife assists with all ADL   Comments: pt with limited input         OT Problem List: Decreased activity tolerance;Impaired balance (sitting and/or standing);Decreased strength;Decreased range of motion;Decreased safety awareness;Decreased cognition;Decreased knowledge of use of DME or AE;Decreased knowledge of precautions;Impaired UE functional use      OT Treatment/Interventions: Self-care/ADL training;Therapeutic exercise;Energy conservation;DME and/or AE instruction;Therapeutic activities;Patient/family education;Balance  training;Visual/perceptual remediation/compensation;Cognitive remediation/compensation    OT Goals(Current goals can be found in the care plan section) Acute Rehab OT Goals Patient Stated Goal: to see his wife OT Goal Formulation: With patient Time For Goal Achievement: 03/01/19 Potential to Achieve Goals: Good ADL Goals Pt Will Perform Grooming: with modified independence;sitting;bed level Pt Will Perform Upper Body Dressing: with modified independence;sitting Pt Will Perform Lower Body Dressing: with min assist;sit to/from stand Pt Will Transfer to Toilet: with min assist;stand pivot transfer Additional ADL Goal #1: Pt will progress to EOB with S in preparation for ADL.  OT Frequency: Min 2X/week   Barriers to D/C: Decreased caregiver support  unsure available support at d/c       Co-evaluation              AM-PAC OT "6 Clicks" Daily Activity     Outcome Measure Help from another person eating meals?: A Little Help from another person taking care of personal grooming?: A Little Help from another person toileting, which includes using toliet, bedpan, or urinal?: Total Help from another person bathing (including washing, rinsing, drying)?: A Lot Help from another person to put on and taking off regular upper body clothing?: A Lot Help from another person to put on and taking off regular lower body clothing?: Total 6 Click Score: 12   End of Session Nurse Communication: Mobility status  Activity Tolerance: Patient limited by lethargy Patient left: in bed;with call bell/phone within reach;with bed alarm set  OT Visit Diagnosis: Unsteadiness on feet (R26.81);Other abnormalities of gait and mobility (R26.89);Muscle weakness (generalized) (  M62.81);Other symptoms and signs involving cognitive function                Time: [id number] OT Time Calculation (min): 17 min Charges:  OT General Charges $OT Visit: 1 Visit OT Evaluation $OT Eval Moderate Complexity: 1 Mod  Diona Brownereresa  Armida Vickroy OTR/L Acute Rehabilitation Services Office: [phone]   Rebeca Alerteresa J Shilo Pauwels 02/15/2019, 4:12 PM

## 2019-02-15 NOTE — Progress Notes (Signed)
  Speech Language Pathology Treatment: Dysphagia  Patient Details Name: [name] MRN: [id number] DOB: 31-Jan-1941 Today's Date: 02/15/2019 Time: 4827-0786 SLP Time Calculation (min) (ACUTE ONLY): 12 min  Assessment / Plan / Recommendation Clinical Impression  F/u after yesterday's swallow evaluation.  Pt's behaviors are similar to yesterday: maintained eyes closed, oriented to self, otherwise confused, asking about paying the rent, etc.  Consumed thin liquids from a straw with adequate control, no s/s of aspiration. Chopped chicken from lunch tray posed more difficulty, with prolonged chewing and inability to fully transfer out of mouth to throat - pt eventually expectorated food after mod verbal cues and manual assist.  Recommend downgrading diet to dysphagia 1 for now; continue thin liquids; assist with careful hand-feeding. SLp will follow.   HPI HPI: 78 y.o. male with PMHx of hypertension, hyperlipidemia, new diagnosis of parkinsons dementia presented 8/22 with decreased oral intake and fever. Dx ARF, mild rhabdomyolysis, sepsis.       SLP Plan  Continue with current plan of care       Recommendations  Diet recommendations: Dysphagia 1 (puree);Thin liquid Liquids provided via: Cup;Straw Medication Administration: Whole meds with puree Supervision: Staff to assist with self feeding Compensations: Minimize environmental distractions Postural Changes and/or Swallow Maneuvers: Seated upright 90 degrees                Oral Care Recommendations: Oral care BID SLP Visit Diagnosis: Dysphagia, unspecified (R13.10) Plan: Continue with current plan of care       GO              [name], [place] CCC/SLP Acute Rehabilitation Services Office number [phone] Pager [phone]   [name] 02/15/2019, 4:16 PM

## 2019-02-15 NOTE — Progress Notes (Signed)
PROGRESS NOTE  [name] ZOX:[id number] DOB: 11/17/1940 DOA: 02/13/2019 PCP: Tresa GarterPlotnikov, Aleksei V, MD  Brief History   [name]  is a 78 y.o. male,   w hypertension, hyperlipidemia, w new diagnosis of parkinsons dementia apparently presents with c/o decrease oral intake since Wednesday.  Pt started to have fever yesterday am per his wife.  Wife is providing the history .  Pt is only axox1 (person), not place or time.  Pt making urine.  No complaints of dysuria. Might have had increase in urinary frequency.  Per wife, no fever, chills, no cp, no palp, no sob, no n/v, no abd pain, no diarrhea, no brbpr, no hematuria, no flank pain.  Pt brought to ED due to fever.   In the ED he was found to have acute renal failure with mild rhabdomyolysis.   [facility] were consulted to admit the patient for further evaluation and treatment. This was likely due to sepsis of unknown source. Blood cultures were obtained which have since grown out due klebsiella pneumonia. He has been started on IV ceftriaxone.  Consultants  . None  Procedures  . None  Antibiotics   Anti-infectives (From admission, onward)   Start     Dose/Rate Route Frequency Ordered Stop   02/14/19 1500  ceFEPIme (MAXIPIME) 2 g in sodium chloride 0.9 % 100 mL IVPB  Status:  Discontinued     2 g 200 mL/hr over 30 Minutes Intravenous Every 24 hours 02/13/19 1512 02/14/19 1028   02/14/19 1200  cefTRIAXone (ROCEPHIN) 2 g in sodium chloride 0.9 % 100 mL IVPB     2 g 200 mL/hr over 30 Minutes Intravenous Every 24 hours 02/14/19 1028     02/13/19 1511  vancomycin variable dose per unstable renal function (pharmacist dosing)  Status:  Discontinued      Does not apply See admin instructions 02/13/19 1512 02/14/19 1028   02/13/19 1415  ceFEPIme (MAXIPIME) 2 g in sodium chloride 0.9 % 100 mL IVPB     2 g 200 mL/hr over 30 Minutes Intravenous  Once 02/13/19 1404 02/13/19 1633   02/13/19 1415  metroNIDAZOLE (FLAGYL) IVPB 500 mg      500 mg 100 mL/hr over 60 Minutes Intravenous  Once 02/13/19 1404 02/13/19 1633   02/13/19 1415  vancomycin (VANCOCIN) IVPB 1000 mg/200 mL premix  Status:  Discontinued     1,000 mg 200 mL/hr over 60 Minutes Intravenous  Once 02/13/19 1404 02/13/19 1407   02/13/19 1415  vancomycin (VANCOCIN) 1,750 mg in sodium chloride 0.9 % 500 mL IVPB  Status:  Discontinued     1,750 mg 250 mL/hr over 120 Minutes Intravenous  Once 02/13/19 1407 02/14/19 1028     .   Subjective  The patient is lying quietly in bed with his eyes closed. He will open his eyes on command. He is verbally and appropriately responsive to voice. His wife is at his bedside.  Objective   Vitals:  Vitals:   02/15/19 0633 02/15/19 1650  BP: (!) 172/90 (!) 183/95  Pulse: 96 96  Resp: 19 17  Temp: 99.2 F (37.3 C) 97.8 F (36.6 C)  SpO2: 96% 95%    Exam:  Constitutional:  The patient is awake and alert, although he is lying with his eyes closed. No acute distress. Respiratory:  . There is no increased work of breathing. . No wheezes, rales, or rhonchi . No tactile fremitus. Cardiovascular:  . Regular rate and rhythm. . No murmurs, ectopy or  gallups. . No lateral PMI. No thrills. Abdomen:  . Abdomen is soft, non-tender, non-distended . No hernias, masses, or hernias are appreciated. . Normoactive bowel sounds.  Musculoskeletal:  . No cyanosis, clubbing, or edema. Skin:  . No rashes or lesions . Positve for pressure ulcer on sacrum . palpation of skin: no induration or nodules Neurologic:  . CN 2-12 intact . Sensation all 4 extremities intact Psychiatric:  . Mood and affect are congruent.  I have personally reviewed the following:   Today's Data  . Vitals, CMP, CBC, Lactic acid, CK  Micro Data  . Blood cultures.  Scheduled Meds: . carbidopa-levodopa  0.5 tablet Oral TID  . heparin  5,000 Units Subcutaneous Q8H   Continuous Infusions: . cefTRIAXone (ROCEPHIN)  IV 2 g (02/15/19 1149)     Principal Problem:   ARF (acute renal failure) (HCC) Active Problems:   Essential hypertension   Dyslipidemia   Sepsis ([place])   Protein-calorie malnutrition, severe (HCC)   Dementia due to Parkinson's disease without behavioral disturbance ([place])   Parkinson's disease ([place])   Pressure injury of skin   LOS: 2 days   A & P   Acute renal failure: Creatinine is down from 4.65 to 3.37. Likely of mixed etiology -pre-renal and rhabdomyolysis.. Renal ultrasound was negative for hydronephrosis. CK trending downward. Continue cautious IV fluids. Avoid nephrotoxic substances and hypotension. SPEP, immunofixation, are pending. Lotrel and lasix are being held. The patient has a foley catheter for accurate urine collection and quantification.  Mild rhabdomyolysis: Due to sepsis. Hydrate, monitor, CK is trending downward. Monitor.  Sepsis: Based upon fever, hypotension, lactic acid of 2.0, leukocytosis, and tachycardia on admission. Now resolving.   Gram negative bacteremia: 1/2 blood cultures drawn on admission has grown out klebsiella pneumonia. The patient has been started on ceftriaxone pending sensitivities.  Severe protein calorie malnutrition: Consult nutrition. Protostat 30 mL's p.o. twice daily.  Parkinson's disease: Chronic. Speech therapy to evaluate swallowing. I spoke with neurology , they recommended starting low dose trial of sinemet and outpatient follow up.  Dementia secondary to Parkinson's disease:  MRI Brain 11/23/2018 IMPRESSION: No acute or reversible finding. Atrophy of the cerebral hemispheres with temporal lobe predominance. Mild moderate chronic small-vessel change of the white matter. DC Trazodone due to somnolence  Thrombocytopenia (mild): Check LDH if high then consider calling oncology r/o TTP. Follow platelets. Probably due to sepsis.  I have seen and examined this patient myself. I have spent 32 minutes in his evaluation and care.  DVT Prophylaxis:     Heparin and SCDs  Family Communication: Patient wife at bedside. Code Status:  FULL CODE, patient is w Nani Gasser Disposition: tbd  Janett Kamath, DO [facility] Direct contact: see [email or web address]  7PM-7AM contact night coverage as above 02/14/2019, 6:44 PM  LOS: 1 day

## 2019-02-15 NOTE — Progress Notes (Signed)
SPEECH PATHOLOGY: LATE ENTRY FROM 02/14/19  02/14/19 1100  SLP Visit Information  SLP Received On 02/14/19  Subjective  Subjective alert, maintains eyes closed  General Information  Date of Onset 02/13/19  HPI 78 y.o. male with PMHx of hypertension, hyperlipidemia, new diagnosis of parkinsons dementia presented 8/22 with decreased oral intake and fever. Dx ARF, mild rhabdomyolysis, sepsis.   Type of Study Bedside Swallow Evaluation  Previous Swallow Assessment no  Diet Prior to this Study Regular;Thin liquids  Temperature Spikes Noted No  Respiratory Status Room air  History of Recent Intubation No  Behavior/Cognition Alert  Oral Cavity Assessment WFL  Oral Care Completed by SLP No  Oral Cavity - Dentition Adequate natural dentition  Self-Feeding Abilities Needs assist  Patient Positioning Upright in bed  Baseline Vocal Quality Normal  Volitional Cough Strong  Volitional Swallow Able to elicit  Oral Motor/Sensory Function  Overall Oral Motor/Sensory Function WFL  Ice Chips  Ice chips WFL  Thin Liquid  Thin Liquid Impaired  Presentation Straw;Cup  Pharyngeal  Phase Impairments Cough - Immediate (x1)  Nectar Thick Liquid  Nectar Thick Liquid NT  Honey Thick Liquid  Honey Thick Liquid NT  Puree  Puree WFL  Solid  Solid Impaired  Oral Phase Impairments Poor awareness of bolus (attempted to suck rather than chew cracker)  SLP - End of Session  Patient left in bed;with call bell/phone within reach;with family/visitor present  Nurse Communication Diet recommendation  SLP Assessment  Clinical Impression Statement (ACUTE ONLY) Pt participated in clinical swallow assessment with his wife present.  He maintained his eyes closed throughout session; followed commands.  Oral mechanism exam was unremarkable.  Pt demonstrated overall adequate toleration of purees and thin liquids - he coughed once after drinking water; subsequent boluses of water did not elicit s/s of aspiration.   Efforts to eat regular solids were impeded by his cognition - i.e., attempting to suck from the cracker rather than masticate it.  Pt's wife describes no prior difficulty with eating/drinking before this admission.  She verbalized an understanding that Parkinson's may have eventual impact on swallowing function.  Hopefully as abx take effect, MS will improve and interest in POs will resume.  SLP will follow briefly for safety/diet progression, and education.   SLP Visit Diagnosis Dysphagia, unspecified (R13.10)  Other Related Risk Factors Cognitive impairment  Swallow Evaluation Recommendations  SLP Diet Recommendations Dysphagia 2 (chopped);Thin  Liquid Administration via Cup;Straw  Medication Administration Whole meds with puree  Supervision Staff to assist with self feeding  Compensations Minimize environmental distractions;Slow rate;Small sips/bites  Postural Changes Seated upright at 90 degrees  Treatment Plan  Oral Care Recommendations Oral care BID  Treatment Recommendations Therapy as outlined in treatment plan below  Follow up Recommendations Other (comment) (tba)  Speech Therapy Frequency (ACUTE ONLY) min 2x/week  Treatment Duration 1 week  Interventions Patient/family education;Trials of upgraded texture/liquids;Diet toleration management by SLP  Prognosis  Prognosis for Safe Diet Advancement Good  Individuals Consulted  Consulted and Agree with Results and Recommendations Patient  SLP Time Calculation  SLP Start Time (ACUTE ONLY) 1140  SLP Stop Time (ACUTE ONLY) 1155  SLP Time Calculation (min) (ACUTE ONLY) 15 min  SLP Evaluations  $ SLP Speech Visit 1 Visit  SLP Evaluations  $BSS Swallow 1 Procedure

## 2019-02-15 NOTE — Plan of Care (Signed)

## 2019-02-16 ENCOUNTER — Inpatient Hospital Stay (HOSPITAL_COMMUNITY): Payer: Medicare Other

## 2019-02-16 ENCOUNTER — Encounter (HOSPITAL_COMMUNITY): Payer: Self-pay | Admitting: General Practice

## 2019-02-16 LAB — CULTURE, BLOOD (ROUTINE X 2)

## 2019-02-16 LAB — PROTEIN ELECTROPHORESIS, SERUM
A/G Ratio: 0.9 (ref 0.7–1.7)
Albumin ELP: 2.3 g/dL — ABNORMAL LOW (ref 2.9–4.4)
Alpha-1-Globulin: 0.5 g/dL — ABNORMAL HIGH (ref 0.0–0.4)
Alpha-2-Globulin: 0.9 g/dL (ref 0.4–1.0)
Beta Globulin: 0.6 g/dL — ABNORMAL LOW (ref 0.7–1.3)
Gamma Globulin: 0.6 g/dL (ref 0.4–1.8)
Globulin, Total: 2.5 g/dL (ref 2.2–3.9)
Total Protein ELP: 4.8 g/dL — ABNORMAL LOW (ref 6.0–8.5)

## 2019-02-16 LAB — COMPREHENSIVE METABOLIC PANEL
ALT: 5 U/L (ref 0–44)
AST: 12 U/L — ABNORMAL LOW (ref 15–41)
Albumin: 1.9 g/dL — ABNORMAL LOW (ref 3.5–5.0)
Alkaline Phosphatase: 46 U/L (ref 38–126)
Anion gap: 9 (ref 5–15)
BUN: 44 mg/dL — ABNORMAL HIGH (ref 8–23)
CO2: 20 mmol/L — ABNORMAL LOW (ref 22–32)
Calcium: 8.4 mg/dL — ABNORMAL LOW (ref 8.9–10.3)
Chloride: 113 mmol/L — ABNORMAL HIGH (ref 98–111)
Creatinine, Ser: 2.16 mg/dL — ABNORMAL HIGH (ref 0.61–1.24)
GFR calc Af Amer: 33 mL/min — ABNORMAL LOW (ref >60)
GFR calc non Af Amer: 28 mL/min — ABNORMAL LOW (ref >60)
Glucose, Bld: 121 mg/dL — ABNORMAL HIGH (ref 70–99)
Potassium: 3.3 mmol/L — ABNORMAL LOW (ref 3.5–5.1)
Sodium: 142 mmol/L (ref 135–145)
Total Bilirubin: 1.2 mg/dL (ref 0.3–1.2)
Total Protein: 4.9 g/dL — ABNORMAL LOW (ref 6.5–8.1)

## 2019-02-16 MED ORDER — POTASSIUM CHLORIDE 20 MEQ/15ML (10%) PO SOLN
40.0000 meq | Freq: Once | ORAL | Status: AC
  Administered 2019-02-16: 13:00:00 40 meq via ORAL
  Filled 2019-02-16: qty 30

## 2019-02-16 MED ORDER — CEFAZOLIN SODIUM-DEXTROSE 1-4 GM/50ML-% IV SOLN
1.0000 g | Freq: Three times a day (TID) | INTRAVENOUS | Status: DC
  Administered 2019-02-16 – 2019-02-18 (×6): 1 g via INTRAVENOUS
  Filled 2019-02-16 (×7): qty 50

## 2019-02-16 MED ORDER — POTASSIUM CHLORIDE CRYS ER 20 MEQ PO TBCR: 40.0000 meq | EXTENDED_RELEASE_TABLET | Freq: Once | ORAL | Status: DC

## 2019-02-16 NOTE — TOC Initial Note (Signed)
Transition of Care [facility](TOC) - Initial/Assessment Note    Patient Details  Name: [name] MRN: [id number] Date of Birth: 04/24/1941  Transition of Care [facility](TOC) CM/SW Contact:    Kingsley PlanWile, Lashanna Angelo Marie, RN Phone Number: 02/16/2019, 4:09 PM  Clinical Narrative:                 Spoke to patient and wife at bedside. PT , and OT recommending SNF. Wife was present when patient worked with PT. Francis DowseSHe realizes they used a stedy to get him up and nurses will probably use lift to get him back in the bed.   [name] wants to take her husband home at discharge with home health services through [facility] ( patient recently had [facility]) . Confirmed face sheet information . Discussed PTAR transport home. Patient will need orders for hospital bed , and hoyer , he has no DME at home. Wife can provide 24 hour assistance at home.   Expected Discharge Plan: Home w Home Health Services Barriers to Discharge: Continued Medical Work up   Patient Goals and CMS Choice Patient states their goals for this hospitalization and ongoing recovery are:: to go home CMS Medicare.gov Compare Post Acute Care list provided to:: Patient Represenative (must comment)(wife) Choice offered to / list presented to : Spouse  Expected Discharge Plan and Services Expected Discharge Plan: Home w Home Health Services   Discharge Planning Services: CM Consult Post Acute Care Choice: Home Health Living arrangements for the past 2 months: Single Family Home                             [facility]: [facility]19 Time HH Agency Contacted: 1608 [facility]: [facility]  Prior Living Arrangements/Services Living arrangements for the past 2 months: Single Family Home Lives with:: Spouse Patient language and need for interpreter reviewed:: Yes Do you feel safe going back to the place where you live?: Yes      Need for Family Participation in Patient Care: Yes  (Comment) Care giver support system in place?: Yes (comment)   Criminal Activity/Legal Involvement Pertinent to Current Situation/Hospitalization: No - Comment as needed  Activities of Daily Living Home Assistive Devices/Equipment: Dentures (specify type) ADL Screening (condition at time of admission) Patient's cognitive ability adequate to safely complete daily activities?: No Is the patient deaf or have difficulty hearing?: Yes Does the patient have difficulty seeing, even when wearing glasses/contacts?: No Does the patient have difficulty concentrating, remembering, or making decisions?: Yes Patient able to express need for assistance with ADLs?: Yes Does the patient have difficulty dressing or bathing?: No Independently performs ADLs?: Yes (appropriate for developmental age) Does the patient have difficulty walking or climbing stairs?: Yes Weakness of Legs: Both Weakness of Arms/Hands: None  Permission Sought/Granted   Permission granted to share information with : Yes, Verbal Permission Granted  Share Information with NAME: Gelene MinkLaverne Rini [phone]           Emotional Assessment Appearance:: Appears stated age       Alcohol / Substance Use: Not Applicable    Admission diagnosis:  Delirium [R41.0] ARF (acute renal failure) (HCC) [N17.9] AKI (acute kidney injury) (HCC) [N17.9] Sepsis with acute renal failure and septic shock, due to unspecified organism, unspecified acute renal failure type (HCC) [A41.9, R65.21, N17.9] Patient Active Problem List   Diagnosis Date Noted  . Pressure injury of skin 02/14/2019  .  ARF (acute renal failure) ([place]) 02/13/2019  . Sepsis ([place]) 02/13/2019  . Protein-calorie malnutrition, severe ([place]) 02/13/2019  . Dementia due to Parkinson's disease without behavioral disturbance ([place]) 02/13/2019  . Parkinson's disease ([place]) 02/13/2019  . Infected sebaceous cyst 12/04/2018  . Hallucinations 11/19/2018  . Shoulder pain 11/19/2018  .  Vitamin B12 deficiency 11/19/2018  . 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 Memory loss 09/21/2018  . Falls frequently 09/21/2018  . Fatigue 10/27/2017  . Edema 07/29/2017  . Chronic venous insufficiency 06/30/2017  . Cellulitis and abscess of leg, except foot 06/30/2017  . Ear pain, referred, right 06/10/2016  . Urinary frequency 04/16/2016  . Cerumen impaction 08/22/2015  . Infection of right hand due to bite 04/20/2014  . Hemifacial spasm 09/15/2013  . Well adult exam 07/16/2013  . Dermatochalasis of eyelid 01/01/2012  . Other facial nerve disorders 01/01/2012  . 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 Twitching 11/04/2011  . Non-REM Parasomnia 10/14/2011  . Dog bite of left hand 08/19/2011  . Wound infection 08/19/2011  . Hypokalemia 12/10/2010  . Hyperglycemia 12/10/2010  . Dyslipidemia   . Benign prostatic hyperplasia   . LEG CRAMPS 03/07/2009  . OTITIS EXTERNA 08/30/2008  . Prostatitis 08/30/2008  . Essential hypertension 08/26/2007  . ERECTILE DYSFUNCTION 08/26/2007   PCP:  Cassandria Anger, MD Pharmacy:   [id number] [place], [place] [address] Phone: [phone] Fax: [phone]  [facility] [place] [place] [facility] [address] [employer] [address] [place] [address] Phone: [phone] Fax: [phone]     Social Determinants of Health (SDOH) Interventions    Readmission Risk Interventions No flowsheet data found.

## 2019-02-16 NOTE — Progress Notes (Signed)
Responded to Spiritual Care request by patient. When I went into the room the patient was sitting in his chair with his wife by his side. He was alert but not looking up. I had conversation with patient and his wife. Per the patient's wife's request I prayed for the patient. The patient gave a vocal thank you for the prayer. Will follow up with continued Spiritual Care as needed.   Chaplain Eligha Bridegroom M.Div Pager# [phone]

## 2019-02-16 NOTE — Plan of Care (Signed)
  Problem: Education: Goal: Knowledge of General Education information will improve Description: Including pain rating scale, medication(s)/side effects and non-pharmacologic comfort measures Outcome: Progressing   Problem: Health Behavior/Discharge Planning: Goal: Ability to manage health-related needs will improve Outcome: Progressing   Problem: Clinical Measurements: Goal: Ability to maintain clinical measurements within normal limits will improve Outcome: Progressing Goal: Will remain free from infection Outcome: Progressing   Problem: Nutrition: Goal: Adequate nutrition will be maintained Outcome: Progressing   Problem: Elimination: Goal: Will not experience complications related to urinary retention Outcome: Progressing   Problem: Pain Managment: Goal: General experience of comfort will improve Outcome: Progressing   Problem: Safety: Goal: Ability to remain free from injury will improve Outcome: Progressing   Problem: Skin Integrity: Goal: Risk for impaired skin integrity will decrease Outcome: Progressing   

## 2019-02-16 NOTE — Evaluation (Signed)
Physical Therapy Evaluation Patient Details Name: [name] MRN: [id number] DOB: 04-Jan-1941 Today's Date: 02/16/2019   History of Present Illness  Pt presents on 02/13/19 with decreased oral intake, new diagnosis of Parkinson's dementia & with PMH significant for HTN. Pt found to have ARF, mild rhabdomylosis and sespis.  Clinical Impression  Pt participation in treatment session limited by lethargy & pt not opening or barely opening eyes throughout session despite ongoing education/encouragement. Pt verbally communicating throughout session but with little participation in functional mobility. Pt requires +2 total assist for supine>sit, and sit<>stand in stedy to transfer to recliner. Pt would benefit from continued skilled PT treatment to focus on strengthening & increasing independence with all mobility.     Follow Up Recommendations SNF    Equipment Recommendations  (TBD)    Recommendations for Other Services       Precautions / Restrictions Precautions Precautions: Fall Restrictions Weight Bearing Restrictions: No      Mobility  Bed Mobility Overal bed mobility: Needs Assistance Bed Mobility: Supine to Sit     Supine to sit: Total assist;+2 for physical assistance     General bed mobility comments: pt does not initiate or participate in supine>sit  Transfers Overall transfer level: Needs assistance   Transfers: Sit to/from Stand Sit to Stand: Total assist;+2 physical assistance            Ambulation/Gait                Stairs            Wheelchair Mobility    Modified Rankin (Stroke Patients Only)       Balance Overall balance assessment: Needs assistance Sitting-balance support: Bilateral upper extremity supported;Feet supported Sitting balance-Leahy Scale: Zero Sitting balance - Comments: pt with posterior lean while sitting EOB Postural control: Posterior lean Standing balance support: Bilateral upper extremity supported;During  functional activity(standing in stedy) Standing balance-Leahy Scale: Poor Standing balance comment: pt able to hold self upright with BUE while standing in stedy but unable to come to full upright standing with anterior pelvic shift                             Pertinent Vitals/Pain Pain Assessment: Faces Faces Pain Scale: No hurt    Home Living Family/patient expects to be discharged to:: Private residence Living Arrangements: Spouse/significant other Available Help at Discharge: Family Type of Home: House Home Access: Stairs to enter Entrance Stairs-Rails: Right Entrance Stairs-Number of Steps: 4(front entrance) Home Layout: One level        Prior Function           Comments: pt's wife reports he has been needing increasing assistance since Feb.     Hand Dominance        Extremity/Trunk Assessment             Cervical / Trunk Assessment Cervical / Trunk Assessment: Kyphotic(forward head)  Communication      Cognition Arousal/Alertness: Lethargic(pt's eyes remain closed throughout majority of session, starts to open then at end of session but not fully)   Overall Cognitive Status: Impaired/Different from baseline Area of Impairment: Orientation;Attention;Memory;Following commands;Safety/judgement;Awareness;Problem solving                 Orientation Level: Disoriented to;Place;Time;Situation   Memory: Decreased recall of precautions;Decreased short-term memory Following Commands: Follows one step commands inconsistently Safety/Judgement: Decreased awareness of safety;Decreased awareness of deficits Awareness: Intellectual Problem Solving: Slow processing;Decreased initiation;Difficulty  sequencing;Requires verbal cues;Requires tactile cues        General Comments      Exercises     Assessment/Plan    PT Assessment Patient needs continued PT services  PT Problem List Decreased strength;Decreased knowledge of precautions;Decreased  coordination;Decreased range of motion;Decreased activity tolerance;Decreased cognition;Cardiopulmonary status limiting activity;Decreased knowledge of use of DME;Decreased balance;Impaired sensation       PT Treatment Interventions DME instruction;Wheelchair mobility training;Therapeutic exercise;Manual techniques;Gait training;Balance training;Stair training;Neuromuscular re-education;Cognitive remediation;Functional mobility training;Therapeutic activities;Patient/family education    PT Goals (Current goals can be found in the Care Plan section)  Acute Rehab PT Goals Patient Stated Goal: to go home PT Goal Formulation: With patient/family Time For Goal Achievement: 03/02/19 Potential to Achieve Goals: Fair    Frequency Min 3X/week   Barriers to discharge Decreased caregiver support;Inaccessible home environment unsure if wife can provide necessary level of care, 4 steps to enter home    Co-evaluation               AM-PAC PT "6 Clicks" Mobility  Outcome Measure Help needed turning from your back to your side while in a flat bed without using bedrails?: Total Help needed moving from lying on your back to sitting on the side of a flat bed without using bedrails?: Total Help needed moving to and from a bed to a chair (including a wheelchair)?: Total Help needed standing up from a chair using your arms (e.g., wheelchair or bedside chair)?: Total Help needed to walk in hospital room?: Total Help needed climbing 3-5 steps with a railing? : Total 6 Click Score: 6    End of Session Equipment Utilized During Treatment: Gait belt Activity Tolerance: Patient tolerated treatment well;Patient limited by lethargy Patient left: in chair;with chair alarm set;with call bell/phone within reach;with family/visitor present Nurse Communication: Mobility status;Need for lift equipment PT Visit Diagnosis: Muscle weakness (generalized) (M62.81);Difficulty in walking, not elsewhere classified  (R26.2)    Time: [id number] PT Time Calculation (min) (ACUTE ONLY): 29 min   Charges:   PT Evaluation $PT Eval Moderate Complexity: 1 Mod PT Treatments $Therapeutic Activity: 8-22 mins        Sandi MariscalVictoria M Breana Litts, PT, DPT 02/16/2019, 3:41 PM

## 2019-02-16 NOTE — Progress Notes (Signed)
PROGRESS NOTE  MANSEL STROTHER EVO:[id number] DOB: [birth year] DOA: 02/13/2019 PCP: Cassandria Anger, MD  Brief History   [name]  is a 78 y.o. male,   w hypertension, hyperlipidemia, w new diagnosis of parkinsons dementia apparently presents with c/o decrease oral intake since Wednesday.  Pt started to have fever yesterday am per his wife.  Wife is providing the history .  Pt is only axox1 (person), not place or time.  Pt making urine.  No complaints of dysuria. Might have had increase in urinary frequency.  Per wife, no fever, chills, no cp, no palp, no sob, no n/v, no abd pain, no diarrhea, no brbpr, no hematuria, no flank pain.  Pt brought to ED due to fever.   In the ED he was found to have acute renal failure with mild rhabdomyolysis.   [facility] hospital[facility] were consulted to admit the patient for further evaluation and treatment. This was likely due to sepsis of unknown source. Blood cultures were obtained which have since grown out due klebsiella pneumonia. He has been started on IV ceftriaxone.  Consultants  . None  Procedures  . None  Antibiotics   Anti-infectives (From admission, onward)   Start     Dose/Rate Route Frequency Ordered Stop   02/16/19 0930  ceFAZolin (ANCEF) IVPB 1 g/50 mL premix     1 g 100 mL/hr over 30 Minutes Intravenous Every 8 hours 02/16/19 0921     02/14/19 1500  ceFEPIme (MAXIPIME) 2 g in sodium chloride 0.9 % 100 mL IVPB  Status:  Discontinued     2 g 200 mL/hr over 30 Minutes Intravenous Every 24 hours 02/13/19 1512 02/14/19 1028   02/14/19 1200  cefTRIAXone (ROCEPHIN) 2 g in sodium chloride 0.9 % 100 mL IVPB  Status:  Discontinued     2 g 200 mL/hr over 30 Minutes Intravenous Every 24 hours 02/14/19 1028 02/16/19 0932   02/13/19 1511  vancomycin variable dose per unstable renal function (pharmacist dosing)  Status:  Discontinued      Does not apply See admin instructions 02/13/19 1512 02/14/19 1028   02/13/19 1415  ceFEPIme (MAXIPIME) 2 g in  sodium chloride 0.9 % 100 mL IVPB     2 g 200 mL/hr over 30 Minutes Intravenous  Once 02/13/19 1404 02/13/19 1633   02/13/19 1415  metroNIDAZOLE (FLAGYL) IVPB 500 mg     500 mg 100 mL/hr over 60 Minutes Intravenous  Once 02/13/19 1404 02/13/19 1633   02/13/19 1415  vancomycin (VANCOCIN) IVPB 1000 mg/200 mL premix  Status:  Discontinued     1,000 mg 200 mL/hr over 60 Minutes Intravenous  Once 02/13/19 1404 02/13/19 1407   02/13/19 1415  vancomycin (VANCOCIN) 1,750 mg in sodium chloride 0.9 % 500 mL IVPB  Status:  Discontinued     1,750 mg 250 mL/hr over 120 Minutes Intravenous  Once 02/13/19 1407 02/14/19 1028     .   Subjective  The patient is lying quietly in bed with his eyes closed. He is eating lunch. Wife is at bedside.  Objective   Vitals:  Vitals:   02/16/19 0454 02/16/19 1357  BP: (!) 158/82 (!) 141/70  Pulse: 84 75  Resp: 18   Temp: 99 F (37.2 C) 99 F (37.2 C)  SpO2: 94% 96%    Exam:  Constitutional:  The patient is awake and alert, although he is lying with his eyes closed. No acute distress. Respiratory:  . There is no increased work of breathing. [name]  No wheezes, rales, or rhonchi . No tactile fremitus. Cardiovascular:  . Regular rate and rhythm. . No murmurs, ectopy or gallups. . No lateral PMI. No thrills. Abdomen:  . Abdomen is soft, non-tender, non-distended . No hernias, masses, or hernias are appreciated. . Normoactive bowel sounds.  Musculoskeletal:  . No cyanosis, clubbing, or edema. Skin:  . No rashes or lesions . Positve for pressure ulcer on sacrum . palpation of skin: no induration or nodules Neurologic:  . CN 2-12 intact . Sensation all 4 extremities intact Psychiatric:  . Mood and affect are congruent.  I have personally reviewed the following:   Today's Data  . Vitals, CMP, CBC, Lactic acid, CK  Micro Data  . Blood cultures.  Scheduled Meds: . carbidopa-levodopa  0.5 tablet Oral TID  . heparin  5,000 Units Subcutaneous  Q8H   Continuous Infusions: .  ceFAZolin (ANCEF) IV 1 g (02/16/19 1140)    Principal Problem:   ARF (acute renal failure) (HCC) Active Problems:   Essential hypertension   Dyslipidemia   Sepsis (HCC)   Protein-calorie malnutrition, severe (HCC)   Dementia due to Parkinson's disease without behavioral disturbance (HCC)   Parkinson's disease (HCC)   Pressure injury of skin   LOS: 3 days   A & P   Acute renal failure: Creatinine is down from 4.65 to 3.37. Likely of mixed etiology -pre-renal and rhabdomyolysis.. Renal ultrasound was negative for hydronephrosis. CK trending downward. Continue cautious IV fluids. Avoid nephrotoxic substances and hypotension. SPEP, immunofixation, are pending. Lotrel and lasix are being held. The patient has a foley catheter for accurate urine collection and quantification.  Mild rhabdomyolysis: Due to sepsis. Hydrate, monitor, CK is trending downward. Monitor.  Sepsis: Based upon fever, hypotension, lactic acid of 2.0, leukocytosis, and tachycardia on admission. Now resolved.  Gram negative bacteremia: 1/2 blood cultures drawn on admission has grown out klebsiella pneumonia. The patient has been started on ceftriaxone pending sensitivities.  Severe protein calorie malnutrition: Consult nutrition. Protostat 30 mL's p.o. twice daily.  Parkinson's disease: Chronic. Speech therapy to evaluate swallowing. I spoke with neurology , they recommended starting low dose trial of sinemet and outpatient follow up.  Dementia secondary to Parkinson's disease:  MRI Brain 11/23/2018 IMPRESSION: No acute or reversible finding. Atrophy of the cerebral hemispheres with temporal lobe predominance. Mild moderate chronic small-vessel change of the white matter. DC Trazodone due to somnolence  Thrombocytopenia (mild): Check LDH if high then consider calling oncology r/o TTP. Follow platelets. Probably due to sepsis.  I have seen and examined this patient myself. I  have spent 35 minutes in his evaluation and care.  DVT Prophylaxis:    Heparin and SCDs  Family Communication: Patient wife at bedside. Code Status:  FULL CODE, patient is w Gelene MinkLaverne Grunow Disposition: tbd  Demarlo Riojas, DO [facility] Direct contact: see [email or web address]  7PM-7AM contact night coverage as above 02/16/2019, 2:45 PM  LOS: 1 day

## 2019-02-16 NOTE — Progress Notes (Signed)
  Speech Language Pathology Treatment: Dysphagia  Patient Details Name: [name] MRN: [id number] DOB: 16-Jul-1940 Today's Date: 02/16/2019 Time: 1103-1594 SLP Time Calculation (min) (ACUTE ONLY): 22 min  Assessment / Plan / Recommendation Clinical Impression  Pt exhibited oral holding and mild L sided pocketing, both of which were alleviated by use of liquid wash and Min cues for lingual sweep. Pt was also very motivated by [employer], refusing solids unless that was provided as a follow up bolus. His wife was present and educated on strategies to facilitate safety and encourage increased intake. Given stable appearing swallow function and mentation, would continue with current diet. SLP will f/u to determine if any upgrades can be made.   HPI HPI: 78 y.o. male with PMHx of hypertension, hyperlipidemia, new diagnosis of parkinsons dementia presented 8/22 with decreased oral intake and fever. Dx ARF, mild rhabdomyolysis, sepsis.       SLP Plan  Continue with current plan of care       Recommendations  Diet recommendations: Dysphagia 1 (puree);Thin liquid Liquids provided via: Cup;Straw Medication Administration: Whole meds with puree(crush large ones PRN) Supervision: Staff to assist with self feeding Compensations: Minimize environmental distractions Postural Changes and/or Swallow Maneuvers: Seated upright 90 degrees                Oral Care Recommendations: Oral care BID Follow up Recommendations: Skilled Nursing facility SLP Visit Diagnosis: Dysphagia, unspecified (R13.10) Plan: Continue with current plan of care       GO                Venita Sheffield Shalen Petrak 02/16/2019, 1:22 PM  Pollyann Glen, M.A. [place] Acute Environmental education officer [phone] Office [phone]

## 2019-02-17 LAB — COMPREHENSIVE METABOLIC PANEL
ALT: <5 U/L (ref 0–44)
AST: 10 U/L — ABNORMAL LOW (ref 15–41)
Albumin: 1.9 g/dL — ABNORMAL LOW (ref 3.5–5.0)
Alkaline Phosphatase: 47 U/L (ref 38–126)
Anion gap: 10 (ref 5–15)
BUN: 44 mg/dL — ABNORMAL HIGH (ref 8–23)
CO2: 22 mmol/L (ref 22–32)
Calcium: 8.4 mg/dL — ABNORMAL LOW (ref 8.9–10.3)
Chloride: 112 mmol/L — ABNORMAL HIGH (ref 98–111)
Creatinine, Ser: 1.91 mg/dL — ABNORMAL HIGH (ref 0.61–1.24)
GFR calc Af Amer: 38 mL/min — ABNORMAL LOW (ref >60)
GFR calc non Af Amer: 33 mL/min — ABNORMAL LOW (ref >60)
Glucose, Bld: 126 mg/dL — ABNORMAL HIGH (ref 70–99)
Potassium: 3.5 mmol/L (ref 3.5–5.1)
Sodium: 144 mmol/L (ref 135–145)
Total Bilirubin: 0.9 mg/dL (ref 0.3–1.2)
Total Protein: 5 g/dL — ABNORMAL LOW (ref 6.5–8.1)

## 2019-02-17 NOTE — Progress Notes (Addendum)
PROGRESS NOTE  [name] PPI:[id number] DOB: 02/16/1941 DOA: 02/13/2019 PCP: Cassandria Anger, MD  Brief History   [name]  is a 78 y.o. male,   w hypertension, hyperlipidemia, w new diagnosis of parkinsons dementia apparently presents with c/o decrease oral intake since Wednesday.  Pt started to have fever yesterday am per his wife.  Wife is providing the history .  Pt is only axox1 (person), not place or time.  Pt making urine.  No complaints of dysuria. Might have had increase in urinary frequency.  Per wife, no fever, chills, no cp, no palp, no sob, no n/v, no abd pain, no diarrhea, no brbpr, no hematuria, no flank pain.  Pt brought to ED due to fever.   In the ED he was found to have acute renal failure with mild rhabdomyolysis.   [facility] were consulted to admit the patient for further evaluation and treatment. This was likely due to sepsis of unknown source. Blood cultures were obtained which have since grown out due klebsiella pneumonia. He has been started on IV ceftriaxone. Surveillance cultures have been collected.  The patient has been evaluated by PT/OT. They have recommended SNF. The wife initially wanted to take him home, but she realizes that he will be difficult to manage at home as weak as he is.  Consultants  . None  Procedures  . None  Antibiotics   Anti-infectives (From admission, onward)   Start     Dose/Rate Route Frequency Ordered Stop   02/16/19 0930  ceFAZolin (ANCEF) IVPB 1 g/50 mL premix     1 g 100 mL/hr over 30 Minutes Intravenous Every 8 hours 02/16/19 0921     02/14/19 1500  ceFEPIme (MAXIPIME) 2 g in sodium chloride 0.9 % 100 mL IVPB  Status:  Discontinued     2 g 200 mL/hr over 30 Minutes Intravenous Every 24 hours 02/13/19 1512 02/14/19 1028   02/14/19 1200  cefTRIAXone (ROCEPHIN) 2 g in sodium chloride 0.9 % 100 mL IVPB  Status:  Discontinued     2 g 200 mL/hr over 30 Minutes Intravenous Every 24 hours 02/14/19 1028 02/16/19  0932   02/13/19 1511  vancomycin variable dose per unstable renal function (pharmacist dosing)  Status:  Discontinued      Does not apply See admin instructions 02/13/19 1512 02/14/19 1028   02/13/19 1415  ceFEPIme (MAXIPIME) 2 g in sodium chloride 0.9 % 100 mL IVPB     2 g 200 mL/hr over 30 Minutes Intravenous  Once 02/13/19 1404 02/13/19 1633   02/13/19 1415  metroNIDAZOLE (FLAGYL) IVPB 500 mg     500 mg 100 mL/hr over 60 Minutes Intravenous  Once 02/13/19 1404 02/13/19 1633   02/13/19 1415  vancomycin (VANCOCIN) IVPB 1000 mg/200 mL premix  Status:  Discontinued     1,000 mg 200 mL/hr over 60 Minutes Intravenous  Once 02/13/19 1404 02/13/19 1407   02/13/19 1415  vancomycin (VANCOCIN) 1,750 mg in sodium chloride 0.9 % 500 mL IVPB  Status:  Discontinued     1,750 mg 250 mL/hr over 120 Minutes Intravenous  Once 02/13/19 1407 02/14/19 1028     .   Subjective  The patient is lying quietly in bed with his eyes opened today. Wife is at bedside. He then voluntarily closes his eyes for most of my visit.  Objective   Vitals:  Vitals:   02/17/19 0424 02/17/19 1431  BP: (!) 154/80 130/76  Pulse: 70 88  Resp: 16  18  Temp: 98.7 F (37.1 C) (!) 97.4 F (36.3 C)  SpO2: 100% 97%    Exam:  Constitutional:  The patient is awake and alert, although he is lying with his eyes closed. No acute distress. Respiratory:  . There is no increased work of breathing. . No wheezes, rales, or rhonchi . No tactile fremitus. Cardiovascular:  . Regular rate and rhythm. . No murmurs, ectopy or gallups. . No lateral PMI. No thrills. Abdomen:  . Abdomen is soft, non-tender, non-distended . No hernias, masses, or hernias are appreciated. . Normoactive bowel sounds.  Musculoskeletal:  . No cyanosis, clubbing, or edema. Skin:  . No rashes or lesions . Positve for pressure ulcer on sacrum . palpation of skin: no induration or nodules Neurologic:  . CN 2-12 intact . Sensation all 4 extremities  intact Psychiatric:  . Mood and affect are congruent.  I have personally reviewed the following:   Today's Data  . Vitals, CMP  Micro Data  . Blood cultures.  Scheduled Meds: . carbidopa-levodopa  0.5 tablet Oral TID  . heparin  5,000 Units Subcutaneous Q8H   Continuous Infusions: .  ceFAZolin (ANCEF) IV 1 g (02/17/19 1138)    Principal Problem:   ARF (acute renal failure) (HCC) Active Problems:   Essential hypertension   Dyslipidemia   Sepsis (HCC)   Protein-calorie malnutrition, severe (HCC)   Dementia due to Parkinson's disease without behavioral disturbance (HCC)   Parkinson's disease (HCC)   Pressure injury of skin   LOS: 4 days   A & P   Acute renal failure: Creatinine is down from 4.65 to 1.91. Likely of mixed etiology -pre-renal and rhabdomyolysis.. Renal ultrasound was negative for hydronephrosis. CK trending downward. Continue cautious IV fluids. Avoid nephrotoxic substances and hypotension. SPEP, immunofixation, are pending. Lotrel and lasix are being held. The patient has a foley catheter for accurate urine collection and quantification.  Mild rhabdomyolysis: Due to sepsis. Hydrate, monitor, CK is trending downward. Monitor.  Sepsis: Based upon fever, hypotension, lactic acid of 2.0, leukocytosis, and tachycardia on admission. Now resolved.  Gram negative bacteremia: 1/2 blood cultures drawn on admission has grown out klebsiella pneumonia. The patient has been started on ceftriaxone pending sensitivities. Surveillance cultures have been collected. They have had no growth left.  Severe protein calorie malnutrition: Consult nutrition. Protostat 30 mL's p.o. twice daily.  Parkinson's disease: Chronic. Speech therapy to evaluate swallowing. I spoke with neurology , they recommended starting low dose trial of sinemet and outpatient follow up.  Dementia secondary to Parkinson's disease:  MRI Brain 11/23/2018 IMPRESSION: No acute or reversible finding.  Atrophy of the cerebral hemispheres with temporal lobe predominance. Mild moderate chronic small-vessel change of the white matter. DC Trazodone due to somnolence  Thrombocytopenia (mild): Check LDH if high then consider calling oncology r/o TTP. Follow platelets. Probably due to sepsis.  I have seen and examined this patient myself. I have spent 30 minutes in his evaluation and care.  DVT Prophylaxis:    Heparin and SCDs  Family Communication: Patient wife at bedside. Code Status:  FULL CODE, patient is w Gelene MinkLaverne Gattuso Disposition: tbd  Alayha Babineaux, DO [facility] Direct contact: see [email or web address]  7PM-7AM contact night coverage as above 02/17/2019, 2:45 PM  LOS: 1 day

## 2019-02-17 NOTE — TOC Progression Note (Addendum)
Transition of Care [facility]) - Progression Note    Patient Details  Name: KEAUN SCHNABEL MRN: [id number] Date of Birth: August 31, 1940  [facility] CM/SW Contact  Jacalyn Lefevre Edson Snowball, RN Phone Number: 02/17/2019, 12:53 PM  Clinical Narrative:     Talked to patient and wife at bedside.   Wife states patient is more alert today and is requesting if PT can work with him again today to see if his mobility has improved. Called PT/OT office left message. Chatted a PT and she will send out a page. If a PT has time they will come work with patient today. Wife voiced understanding. Wife is hoping patient will improve enough that she will not need the hoyer lift at home. NCM discussed discharge timing with MD and was told a couple of days . Wife aware.   Again NCM explained to wife NCM can start the SNF process and provide her with a list of bed offers and that time she can still decline placement . Wife does not want NCM to start process. Wife aware we need to have a discharge plan in place for when patient ready for discharge. Wife voices understanding but will not consent for NCM to fax out her husband's information.   Provided Medicare.gov list of SNF within 25 mile range of patient's home. Wife understands NCM waiting her decision.     FL2 done incase wife agrees to SNF   Expected Discharge Plan: [place] Barriers to Discharge: Continued Medical Work up  Expected Discharge Plan and Services Expected Discharge Plan: [place]   Discharge Planning Services: CM Consult Post Acute Care Choice: [place] arrangements for the past 2 months: [place]: [facility] Date [place]: 02/16/19 Time Luis M. [place]: 1608 Representative spoke with at [place]: Cassie   Social Determinants of Health ([place]) Interventions    Readmission Risk Interventions No flowsheet  data found.

## 2019-02-17 NOTE — Care Management (Signed)
    Durable Medical Equipment  (From admission, onward)         Start     Ordered   02/17/19 0825  For home use only DME lightweight manual wheelchair with seat cushion  Once    Comments: Patient suffers from Acute renal failure which impairs their ability to perform daily activities like ambulating  in the home.  A cane will not resolve  issue with performing activities of daily living. A wheelchair will allow patient to safely perform daily activities. Patient is not able to propel themselves in the home using a standard weight wheelchair due to weakness. Patient can self propel in the lightweight wheelchair. Length of need one year  Accessories: elevating leg rests (ELRs), wheel locks, extensions and anti-tippers.  Seat and back cushions  Call patient's wife for delivery Travion Ke [phone]   02/17/19 0826   02/17/19 0822  For home use only DME Hospital bed  Once    Comments: Call patient's wife for delivery Rainey Kahrs [phone]  Question Answer Comment  Length of Need 12 Months   Patient has (list medical condition): Acute renal failure   The above medical condition requires: Patient requires the ability to reposition frequently   Head must be elevated greater than: 45 degrees   Bed type Semi-electric   Hoyer Lift Yes   Support Surface: Gel Overlay      02/17/19 0822   02/17/19 0821  For home use only DME Walker rolling  Once    Question:  Patient needs a walker to treat with the following condition  Answer:  Acute renal failure (ARF) ([place])   02/17/19 0071   02/17/19 0821  For home use only DME 3 n 1  Once     02/17/19 2197

## 2019-02-17 NOTE — Progress Notes (Signed)
Physical Therapy Treatment Patient Details Name: [name] MRN: [id number] DOB: 1940-12-03 Today's Date: 02/17/2019    History of Present Illness Pt presents on 02/13/19 with decreased oral intake, new diagnosis of Parkinson's dementia & with PMH significant for HTN. Pt found to have ARF, mild rhabdomylosis and sespis.    PT Comments    Pt with significant improvements in alertness today as pt able to initiate & participate in mobility tasks, with ongoing encouragement/cuing to keep eyes open. Pt requires max assist +1 with wife assisting with managing IV pole during mobility. Pt would benefit from continued PT services to focus on balance, transfers, gait, and to reduce burden of care. D/c recommendation of SNF remains appropriate to focus on deficits noted above & below and increased independence prior to pt's return home.      Follow Up Recommendations  SNF     Equipment Recommendations  (TBD in next venue)    Recommendations for Other Services       Precautions / Restrictions Precautions Precautions: Fall Restrictions Weight Bearing Restrictions: No    Mobility  Bed Mobility Overal bed mobility: Needs Assistance Bed Mobility: Supine to Sit     Supine to sit: Max assist;HOB elevated     General bed mobility comments: pt able to initate supine>sit but requires assistance to scoot buttocks to EOB & get feet on floor  Transfers Overall transfer level: Needs assistance Equipment used: Rolling walker (2 wheeled) Transfers: Sit to/from [employer] Transfers Sit to Stand: +2 physical assistance;Max assist Stand pivot transfers: Max assist;+2 physical assistance       General transfer comment: wife assisted with managing IV pole, pt requires max multimodal cuing for safe hand placement & seqeuncing, pt demonstrates shuffled Parkinsonian gait during stand pivot transfer  Ambulation/Gait Ambulation/Gait assistance: Max assist Gait Distance (Feet): 3 Feet(3 ft  forwards + 3 ft backwards) Assistive device: Rolling walker (2 wheeled)   Gait velocity: decreased   General Gait Details: shuffled gait, extremely decreased step length   Stairs             Wheelchair Mobility    Modified Rankin (Stroke Patients Only)       Balance Overall balance assessment: Needs assistance Sitting-balance support: Bilateral upper extremity supported;Feet supported Sitting balance-Leahy Scale: Poor Sitting balance - Comments: pt with posterior/L lean in sitting, requires close supervision<>min assist for static sitting balance     Standing balance-Leahy Scale: Zero Standing balance comment: pt requires max assist for standing balance with RW 2/2 L/posterior lean, inability to recognize postural deficits and unable to bring pelvis anteriorly                            Cognition Arousal/Alertness: Lethargic   Overall Cognitive Status: Impaired/Different from baseline                   Orientation Level: (oriented to self)   Memory: Decreased recall of precautions;Decreased short-term memory   Safety/Judgement: Decreased awareness of safety;Decreased awareness of deficits     General Comments: pt with ongoing confusion, reporting he's going home tonight, unsure of being in hospital or why but does recognize he's [place] in [place], lethargic but able to keep eyes open during half of the session      Exercises      General Comments General comments (skin integrity, edema, etc.): Pt with imprevements in alertness but still requires encouragement/cuing for engagement & opening eyes during  session. Pt still confused with poor awareness of deficits.      Pertinent Vitals/Pain Pain Assessment: No/denies pain    Home Living Family/patient expects to be discharged to:: Private residence                    Prior Function            PT Goals (current goals can now be found in the care plan section) Acute Rehab PT  Goals Patient Stated Goal: to go home PT Goal Formulation: With patient/family Time For Goal Achievement: 03/02/19 Potential to Achieve Goals: Fair Progress towards PT goals: Progressing toward goals    Frequency    Min 3X/week      PT Plan Current plan remains appropriate    Co-evaluation PT/OT/SLP Co-Evaluation/Treatment: Yes            AM-PAC PT "6 Clicks" Mobility   Outcome Measure  Help needed turning from your back to your side while in a flat bed without using bedrails?: A Lot Help needed moving from lying on your back to sitting on the side of a flat bed without using bedrails?: A Lot Help needed moving to and from a bed to a chair (including a wheelchair)?: A Lot Help needed standing up from a chair using your arms (e.g., wheelchair or bedside chair)?: A Lot Help needed to walk in hospital room?: A Lot Help needed climbing 3-5 steps with a railing? : Total 6 Click Score: 11    End of Session Equipment Utilized During Treatment: Gait belt Activity Tolerance: Patient tolerated treatment well Patient left: in chair;with chair alarm set;with call bell/phone within reach;with family/visitor present Nurse Communication: Mobility status;Need for lift equipment PT Visit Diagnosis: Muscle weakness (generalized) (M62.81);Difficulty in walking, not elsewhere classified (R26.2)     Time: [id number] PT Time Calculation (min) (ACUTE ONLY): 22 min  Charges:  $Therapeutic Activity: 8-22 mins                     Sandi MariscalVictoria M Janae Bonser, PT, DPT 02/17/2019, 2:03 PM

## 2019-02-18 LAB — CBC WITH DIFFERENTIAL/PLATELET
Abs Immature Granulocytes: 0.09 10*3/uL — ABNORMAL HIGH (ref 0.00–0.07)
Basophils Absolute: 0 10*3/uL (ref 0.0–0.1)
Basophils Relative: 0 %
Eosinophils Absolute: 0.1 10*3/uL (ref 0.0–0.5)
Eosinophils Relative: 2 %
HCT: 36.6 % — ABNORMAL LOW (ref 39.0–52.0)
Hemoglobin: 12.5 g/dL — ABNORMAL LOW (ref 13.0–17.0)
Immature Granulocytes: 1 %
Lymphocytes Relative: 13 %
Lymphs Abs: 0.8 10*3/uL (ref 0.7–4.0)
MCH: 29.5 pg (ref 26.0–34.0)
MCHC: 34.2 g/dL (ref 30.0–36.0)
MCV: 86.3 fL (ref 80.0–100.0)
Monocytes Absolute: 0.9 10*3/uL (ref 0.1–1.0)
Monocytes Relative: 13 %
Neutro Abs: 4.5 10*3/uL (ref 1.7–7.7)
Neutrophils Relative %: 71 %
Platelets: 149 10*3/uL — ABNORMAL LOW (ref 150–400)
RBC: 4.24 MIL/uL (ref 4.22–5.81)
RDW: 12.7 % (ref 11.5–15.5)
WBC: 6.3 10*3/uL (ref 4.0–10.5)
nRBC: 0 % (ref 0.0–0.2)

## 2019-02-18 LAB — COMPREHENSIVE METABOLIC PANEL
ALT: 7 U/L (ref 0–44)
AST: 10 U/L — ABNORMAL LOW (ref 15–41)
Albumin: 1.8 g/dL — ABNORMAL LOW (ref 3.5–5.0)
Alkaline Phosphatase: 50 U/L (ref 38–126)
Anion gap: 9 (ref 5–15)
BUN: 36 mg/dL — ABNORMAL HIGH (ref 8–23)
CO2: 25 mmol/L (ref 22–32)
Calcium: 8.4 mg/dL — ABNORMAL LOW (ref 8.9–10.3)
Chloride: 111 mmol/L (ref 98–111)
Creatinine, Ser: 1.69 mg/dL — ABNORMAL HIGH (ref 0.61–1.24)
GFR calc Af Amer: 44 mL/min — ABNORMAL LOW (ref >60)
GFR calc non Af Amer: 38 mL/min — ABNORMAL LOW (ref >60)
Glucose, Bld: 117 mg/dL — ABNORMAL HIGH (ref 70–99)
Potassium: 3.5 mmol/L (ref 3.5–5.1)
Sodium: 145 mmol/L (ref 135–145)
Total Bilirubin: 0.5 mg/dL (ref 0.3–1.2)
Total Protein: 4.7 g/dL — ABNORMAL LOW (ref 6.5–8.1)

## 2019-02-18 LAB — URINE CULTURE: Culture: <10000 — AB

## 2019-02-18 MED ORDER — CEPHALEXIN 500 MG PO CAPS: 500.0000 mg | ORAL_CAPSULE | Freq: Two times a day (BID) | ORAL | Status: DC

## 2019-02-18 MED ORDER — CEPHALEXIN 500 MG PO CAPS
500.0000 mg | ORAL_CAPSULE | Freq: Three times a day (TID) | ORAL | Status: DC
  Administered 2019-02-18 – 2019-02-24 (×18): 500 mg via ORAL
  Filled 2019-02-18 (×18): qty 1

## 2019-02-18 MED ORDER — HYDRALAZINE HCL 20 MG/ML IJ SOLN
10.0000 mg | Freq: Once | INTRAMUSCULAR | Status: AC
  Administered 2019-02-18: 10 mg via INTRAVENOUS
  Filled 2019-02-18: qty 1

## 2019-02-18 MED ORDER — CIPROFLOXACIN HCL 500 MG PO TABS: 500.0000 mg | ORAL_TABLET | Freq: Two times a day (BID) | ORAL | Status: DC

## 2019-02-18 MED ORDER — CEFAZOLIN SODIUM-DEXTROSE 2-4 GM/100ML-% IV SOLN
2.0000 g | Freq: Three times a day (TID) | INTRAVENOUS | Status: DC
  Administered 2019-02-18: 11:00:00 2 g via INTRAVENOUS
  Filled 2019-02-18 (×3): qty 100

## 2019-02-18 NOTE — Plan of Care (Signed)

## 2019-02-18 NOTE — Progress Notes (Signed)
PHARMACY NOTE:  ANTIMICROBIAL RENAL DOSAGE ADJUSTMENT  Current antimicrobial regimen includes a mismatch between antimicrobial dosage and estimated renal function.  As per policy approved by the Pharmacy & Therapeutics and Medical Executive Committees, the antimicrobial dosage will be adjusted accordingly.  Current antimicrobial dosage:  Cefazolin 1g q8h   Indication: Bacteremia   Renal Function:  Estimated Creatinine Clearance: 36.6 mL/min (A) (by C-G formula based on SCr of 1.69 mg/dL (H)).     Antimicrobial dosage has been changed to:  Cefazolin 2g q8h    Thank you for allowing pharmacy to be a part of this patient's care.  Phillis Haggis, [facility] 02/18/2019 9:26 AM

## 2019-02-18 NOTE — Progress Notes (Signed)
Occupational Therapy Treatment Patient Details Name: [name] MRN: [id number] DOB: 07-Sep-1940 Today's Date: 02/18/2019    History of present illness Pt presents on 02/13/19 with decreased oral intake, new diagnosis of Parkinson's dementia & with PMH significant for HTN. Pt found to have ARF, mild rhabdomylosis and sespis.   OT comments  Pt up in recliner upon arrival. Pt agitated/confused. Pt does not realize that he is in the hospital and keeps stating thathe needs scissors to "cut the tape of my arm and get out of here". Was able to redirect pt with multimodal cues and he participated in simple grooming with min A and donning clean gown with mod A. Pt required min A for balance/support of trunk sitting up in recliner away from back of chair with leaning back and to the L. OT will continue to follow acutely  Follow Up Recommendations  SNF    Equipment Recommendations  3 in 1 bedside commode    Recommendations for Other Services      Precautions / Restrictions Precautions Precautions: Fall Restrictions Weight Bearing Restrictions: No       Mobility Bed Mobility               General bed mobility comments: pt in recliner upon arrival  Transfers                 General transfer comment: pt declined standing with OT and NT, pt agitated and distracted about leaving    Balance Overall balance assessment: Needs assistance Sitting-balance support: Bilateral upper extremity supported;Feet supported Sitting balance-Leahy Scale: Poor Sitting balance - Comments: leaning back and to L sitting up in recliner. Min A for balance/support during grooming tasks                                   ADL either performed or assessed with clinical judgement   ADL Overall ADL's : Needs assistance/impaired     Grooming: Minimal assistance;Sitting;Wash/dry hands;Wash/dry face Grooming Details (indicate cue type and reason): min A for balance/support          Upper Body Dressing : Moderate assistance                           Vision Patient Visual Report: No change from baseline     Perception     Praxis      Cognition Arousal/Alertness: Awake/alert Behavior During Therapy: Agitated;Anxious Overall Cognitive Status: Impaired/Different from baseline Area of Impairment: Orientation;Attention;Memory;Following commands;Safety/judgement;Awareness;Problem solving                     Memory: Decreased recall of precautions;Decreased short-term memory Following Commands: Follows one step commands inconsistently Safety/Judgement: Decreased awareness of safety;Decreased awareness of deficits   Problem Solving: Slow processing;Decreased initiation;Difficulty sequencing;Requires verbal cues;Requires tactile cues General Comments: pt still confused and does not realize that he is in the hospital and keeps stating thathe needs scissors to "cut the tape of my arm and get out of here"        Exercises     Shoulder Instructions       General Comments      Pertinent Vitals/ Pain       Pain Assessment: No/denies pain Faces Pain Scale: No hurt Pain Intervention(s): Monitored during session  Home Living  Prior Functioning/Environment              Frequency           Progress Toward Goals  OT Goals(current goals can now be found in the care plan section)  Progress towards OT goals: Progressing toward goals     Plan Discharge plan remains appropriate    Co-evaluation                 AM-PAC OT "6 Clicks" Daily Activity     Outcome Measure   Help from another person eating meals?: A Little Help from another person taking care of personal grooming?: A Little Help from another person toileting, which includes using toliet, bedpan, or urinal?: Total Help from another person bathing (including washing, rinsing, drying)?: Total Help from  another person to put on and taking off regular upper body clothing?: A Lot Help from another person to put on and taking off regular lower body clothing?: Total 6 Click Score: 11    End of Session    OT Visit Diagnosis: Unsteadiness on feet (R26.81);Other abnormalities of gait and mobility (R26.89);Muscle weakness (generalized) (M62.81);Other symptoms and signs involving cognitive function   Activity Tolerance Treatment limited secondary to agitation   Patient Left with call bell/phone within reach;in chair;with chair alarm set   Nurse Communication          Time: [id number] OT Time Calculation (min): 18 min  Charges: OT General Charges $OT Visit: 1 Visit OT Treatments $Self Care/Home Management : 8-22 mins     Galen ManilaSpencer, Braedyn Riggle Jeanette 02/18/2019, 1:06 PM

## 2019-02-18 NOTE — Progress Notes (Signed)
  Speech Language Pathology Treatment: Dysphagia  Patient Details Name: [name] MRN: [id number] DOB: Feb 05, 1941 Today's Date: 02/18/2019 Time: 1140-1200 SLP Time Calculation (min) (ACUTE ONLY): 20 min  Assessment / Plan / Recommendation Clinical Impression  Patient seen with wife present for dysphagia treatment with Jello and thin liquids (water). He did not exhibit any overt s/s of aspiration or penetration with straw sips of water and bites of jello. He did exhibit prolonged mastication of Jello, but no oral holding or pocketing. Towards end of PO intake, he started to hiccup, but intensity and duration was mild. His wife mentioned that at home prior to this hospitalization, she noticed him hiccuping or clearing his throat, and typically, this was not during or immediately after meals. SLP informed MD via secure chat that patient may be having s/s of GERD.    HPI HPI: 78 y.o. male with PMHx of hypertension, hyperlipidemia, new diagnosis of parkinsons dementia presented 8/22 with decreased oral intake and fever. Dx ARF, mild rhabdomyolysis, sepsis.       SLP Plan  Continue with current plan of care       Recommendations  Diet recommendations: Dysphagia 1 (puree);Thin liquid Liquids provided via: Cup;Straw Medication Administration: Whole meds with puree Supervision: Staff to assist with self feeding;Full supervision/cueing for compensatory strategies Compensations: Minimize environmental distractions Postural Changes and/or Swallow Maneuvers: Seated upright 90 degrees                Oral Care Recommendations: Oral care BID Follow up Recommendations: Skilled Nursing facility SLP Visit Diagnosis: Dysphagia, unspecified (R13.10) Plan: Continue with current plan of care       GO                [name] 02/18/2019, 2:01 PM   Larry Baller, MA, [place] [facility] Pager: [phone]

## 2019-02-18 NOTE — Care Management (Signed)
Went to patient's room to discuss disposition plan. Patient asleep wife not at bedside. Called Loic Hobin [phone], woman answered phone who stated she is the daughter and will give her the message to call NCM back. Provided my direct number.   Magdalen Spatz RN BSN

## 2019-02-18 NOTE — Progress Notes (Signed)
PROGRESS NOTE  [name] ZOX:[id number] DOB: [birth year] DOA: 02/13/2019 PCP: Cassandria Anger, MD  Brief History   [name]  is a 78 y.o. male,   w hypertension, hyperlipidemia, w new diagnosis of parkinsons dementia apparently presents with c/o decrease oral intake since Wednesday.  Pt started to have fever yesterday am per his wife.  Wife is providing the history .  Pt is only axox1 (person), not place or time.  Pt making urine.  No complaints of dysuria. Might have had increase in urinary frequency.  Per wife, no fever, chills, no cp, no palp, no sob, no n/v, no abd pain, no diarrhea, no brbpr, no hematuria, no flank pain.  Pt brought to ED due to fever.   In the ED he was found to have acute renal failure with mild rhabdomyolysis.   [facility] were consulted to admit the patient for further evaluation and treatment. This was likely due to sepsis of unknown source. Blood cultures were obtained which have since grown out due klebsiella pneumonia. He has been started on IV ceftriaxone. Surveillance cultures have been collected.  The patient has been evaluated by PT/OT. They have recommended SNF. The wife initially wanted to take him home, but she realizes that he will be difficult to manage at home as weak as he is.  Consultants  . None  Procedures  . None  Antibiotics   Anti-infectives (From admission, onward)   Start     Dose/Rate Route Frequency Ordered Stop   02/18/19 2200  cephALEXin (KEFLEX) capsule 500 mg  Status:  Discontinued     500 mg Oral Every 12 hours 02/18/19 1335 02/18/19 1339   02/18/19 2000  ciprofloxacin (CIPRO) tablet 500 mg  Status:  Discontinued     500 mg Oral 2 times daily 02/18/19 1258 02/18/19 1334   02/18/19 1400  cephALEXin (KEFLEX) capsule 500 mg     500 mg Oral Every 8 hours 02/18/19 1339     02/18/19 1200  ceFAZolin (ANCEF) IVPB 2g/100 mL premix  Status:  Discontinued     2 g 200 mL/hr over 30 Minutes Intravenous Every 8 hours  02/18/19 0927 02/18/19 1259   02/16/19 0930  ceFAZolin (ANCEF) IVPB 1 g/50 mL premix  Status:  Discontinued     1 g 100 mL/hr over 30 Minutes Intravenous Every 8 hours 02/16/19 0921 02/18/19 0927   02/14/19 1500  ceFEPIme (MAXIPIME) 2 g in sodium chloride 0.9 % 100 mL IVPB  Status:  Discontinued     2 g 200 mL/hr over 30 Minutes Intravenous Every 24 hours 02/13/19 1512 02/14/19 1028   02/14/19 1200  cefTRIAXone (ROCEPHIN) 2 g in sodium chloride 0.9 % 100 mL IVPB  Status:  Discontinued     2 g 200 mL/hr over 30 Minutes Intravenous Every 24 hours 02/14/19 1028 02/16/19 0932   02/13/19 1511  vancomycin variable dose per unstable renal function (pharmacist dosing)  Status:  Discontinued      Does not apply See admin instructions 02/13/19 1512 02/14/19 1028   02/13/19 1415  ceFEPIme (MAXIPIME) 2 g in sodium chloride 0.9 % 100 mL IVPB     2 g 200 mL/hr over 30 Minutes Intravenous  Once 02/13/19 1404 02/13/19 1633   02/13/19 1415  metroNIDAZOLE (FLAGYL) IVPB 500 mg     500 mg 100 mL/hr over 60 Minutes Intravenous  Once 02/13/19 1404 02/13/19 1633   02/13/19 1415  vancomycin (VANCOCIN) IVPB 1000 mg/200 mL premix  Status:  Discontinued  1,000 mg 200 mL/hr over 60 Minutes Intravenous  Once 02/13/19 1404 02/13/19 1407   02/13/19 1415  vancomycin (VANCOCIN) 1,750 mg in sodium chloride 0.9 % 500 mL IVPB  Status:  Discontinued     1,750 mg 250 mL/hr over 120 Minutes Intravenous  Once 02/13/19 1407 02/14/19 1028     .   Subjective  The patient is sitting up at bedside. No new complaints.  Objective   Vitals:  Vitals:   02/17/19 2050 02/18/19 0440  BP: (!) 156/78 (!) 175/81  Pulse: 81 77  Resp: 18 20  Temp: 99.5 F (37.5 C) 98.8 F (37.1 C)  SpO2: 96% 91%    Exam:  Constitutional:  The patient is awake and alert. No acute distress. Respiratory:  . There is no increased work of breathing. . No wheezes, rales, or rhonchi . No tactile fremitus. Cardiovascular:  . Regular rate  and rhythm. . No murmurs, ectopy or gallups. . No lateral PMI. No thrills. Abdomen:  . Abdomen is soft, non-tender, non-distended . No hernias, masses, or hernias are appreciated. . Normoactive bowel sounds.  Musculoskeletal:  . No cyanosis, clubbing, or edema. Skin:  . No rashes or lesions . Positve for pressure ulcer on sacrum . palpation of skin: no induration or nodules Neurologic:  . CN 2-12 intact . Sensation all 4 extremities intact Psychiatric:  . Mood and affect are congruent.  I have personally reviewed the following:   Today's Data  . Vitals, CMP  Micro Data  . Blood cultures.  Scheduled Meds: . carbidopa-levodopa  0.5 tablet Oral TID  . cephALEXin  500 mg Oral Q8H  . heparin  5,000 Units Subcutaneous Q8H   Continuous Infusions:   Principal Problem:   ARF (acute renal failure) (HCC) Active Problems:   Essential hypertension   Dyslipidemia   Sepsis (HCC)   Protein-calorie malnutrition, severe (HCC)   Dementia due to Parkinson's disease without behavioral disturbance (HCC)   Parkinson's disease (HCC)   Pressure injury of skin   LOS: 5 days   A & P   Acute renal failure: Creatinine is down from 4.65 to 1.91. Likely of mixed etiology -pre-renal and rhabdomyolysis.. Renal ultrasound was negative for hydronephrosis. CK trending downward. Continue cautious IV fluids. Avoid nephrotoxic substances and hypotension. SPEP, immunofixation, are pending. Lotrel and lasix are being held. The patient has a foley catheter for accurate urine collection and quantification.  Mild rhabdomyolysis: Due to sepsis. Hydrate, monitor, CK is trending downward. Monitor.  Sepsis: Based upon fever, hypotension, lactic acid of 2.0, leukocytosis, and tachycardia on admission. Now resolved.  Gram negative bacteremia: 1/2 blood cultures drawn on admission has grown out klebsiella pneumonia. The patient has been started on ceftriaxone pending sensitivities. Surveillance cultures have  been collected. They have had no growth left. The patient has been converted to oral antibiotics.  Severe protein calorie malnutrition: Consult nutrition. Protostat 30 mL's p.o. twice daily.  Parkinson's disease: Chronic. Speech therapy to evaluate swallowing. I spoke with neurology , they recommended starting low dose trial of sinemet and outpatient follow up.  Dementia secondary to Parkinson's disease:  MRI Brain 11/23/2018 IMPRESSION: No acute or reversible finding. Atrophy of the cerebral hemispheres with temporal lobe predominance. Mild moderate chronic small-vessel change of the white matter. DC Trazodone due to somnolence  Thrombocytopenia (mild): Check LDH if high then consider calling oncology r/o TTP. Follow platelets. Probably due to sepsis.  I have seen and examined this patient myself. I have spent 30 minutes in his  evaluation and care.  DVT Prophylaxis:    Heparin and SCDs  Family Communication: Patient wife at bedside. Code Status:  FULL CODE, patient is w [name] Disposition: Pt recommends SNF. Wife is reticent although she understands that the patient is going to be very difficult to care for at home.  Larry Cavanah, DO [facility] Direct contact: see [email or web address]  7PM-7AM contact night coverage as above 02/18/2019, 5:13 PM  LOS: 1 day

## 2019-02-18 NOTE — Progress Notes (Signed)
Spoke to patient's daughter and proved her with an update. All questions answered to her satisfaction.

## 2019-02-19 LAB — CBC WITH DIFFERENTIAL/PLATELET
Abs Immature Granulocytes: 0.11 10*3/uL — ABNORMAL HIGH (ref 0.00–0.07)
Basophils Absolute: 0 10*3/uL (ref 0.0–0.1)
Basophils Relative: 0 %
Eosinophils Absolute: 0.1 10*3/uL (ref 0.0–0.5)
Eosinophils Relative: 2 %
HCT: 37.5 % — ABNORMAL LOW (ref 39.0–52.0)
Hemoglobin: 12.8 g/dL — ABNORMAL LOW (ref 13.0–17.0)
Immature Granulocytes: 2 %
Lymphocytes Relative: 16 %
Lymphs Abs: 1 10*3/uL (ref 0.7–4.0)
MCH: 29.6 pg (ref 26.0–34.0)
MCHC: 34.1 g/dL (ref 30.0–36.0)
MCV: 86.6 fL (ref 80.0–100.0)
Monocytes Absolute: 0.6 10*3/uL (ref 0.1–1.0)
Monocytes Relative: 9 %
Neutro Abs: 4.3 10*3/uL (ref 1.7–7.7)
Neutrophils Relative %: 71 %
Platelets: 168 10*3/uL (ref 150–400)
RBC: 4.33 MIL/uL (ref 4.22–5.81)
RDW: 12.5 % (ref 11.5–15.5)
WBC: 6.1 10*3/uL (ref 4.0–10.5)
nRBC: 0 % (ref 0.0–0.2)

## 2019-02-19 NOTE — Progress Notes (Signed)
  Speech Language Pathology Treatment: Dysphagia  Patient Details Name: [name] MRN: [id number] DOB: 28-Sep-1940 Today's Date: 02/19/2019 Time: 5997-7414 SLP Time Calculation (min) (ACUTE ONLY): 17 min  Assessment / Plan / Recommendation Clinical Impression  Pt is more alert today and sitting upright in his chair. He opens his eyes and engages in self-feeding with assistance. SLP provided trials of advanced solids with only mild left buccal pocketing that cleared well with Min cues and liquid washes. Recommend upgrading to Dys 2 diet and thin liquids, still with full supervision to monitor for safety and encourage intake.    HPI HPI: 78 y.o. male with PMHx of hypertension, hyperlipidemia, new diagnosis of parkinsons dementia presented 8/22 with decreased oral intake and fever. Dx ARF, mild rhabdomyolysis, sepsis.       SLP Plan  Continue with current plan of care       Recommendations  Diet recommendations: Dysphagia 2 (fine chop);Thin liquid Liquids provided via: Cup;Straw Medication Administration: Whole meds with puree Supervision: Staff to assist with self feeding;Full supervision/cueing for compensatory strategies Compensations: Minimize environmental distractions Postural Changes and/or Swallow Maneuvers: Seated upright 90 degrees                Oral Care Recommendations: Oral care BID Follow up Recommendations: Skilled Nursing facility SLP Visit Diagnosis: Dysphagia, unspecified (R13.10) Plan: Continue with current plan of care       GO                Venita Sheffield Jackalyn Haith 02/19/2019, 3:44 PM  Pollyann Glen, M.A. [place] Acute Environmental education officer [phone] Office [phone]

## 2019-02-19 NOTE — Progress Notes (Signed)
Physical Therapy Treatment Patient Details Name: [name] M Persons MRN: [id number] DOB: 04/15/1941 Today's Date: 02/19/2019    History of Present Illness Pt presents on 02/13/19 with decreased oral intake, new diagnosis of Parkinson's dementia & with PMH significant for HTN. Pt found to have ARF, mild rhabdomylosis and sespis.    PT Comments    Pt performed gt functional mobility and balance training this session.  Pt continues to present with posterior bias and L lateral lean.  He cannot maintain static sitting without min to moderate assistance but when given a tasks he can weight shift forward.  Pt is guarded and resistant to movement.  Util ized sara stedy and he was able to initiate transfer into standing with the only assistance needed for hip extension ( min-mod +2).  Wife present at bedside and pleased with his progress,  Pt resting in chair with chair alarm set post session and maximove sling pad under patient.      Follow Up Recommendations  SNF     Equipment Recommendations  (TBD at next venue)    Recommendations for Other Services       Precautions / Restrictions Precautions Precautions: Fall Restrictions Weight Bearing Restrictions: No    Mobility  Bed Mobility Overal bed mobility: Needs Assistance Bed Mobility: Supine to Sit     Supine to sit: Mod assist     General bed mobility comments: Pt with posterior pelvic tilt and posterior listing/bias in sitting.  Required moderate assistance to come to sitting and weight shift forward to maintain sitting.  Transfers Overall transfer level: Needs assistance Equipment used: Ambulation equipment used(sara stedy.) Transfers: Sit to/from Stand Sit to Stand: +2 physical assistance;Mod assist(intermittent min to mod with use of sara stedy and +2 for safety and assistance to achieve hip extension.)         General transfer comment: Cues for hand placement and forward weight shifting from elevate surface height.  He was  able to achieve hip clearance but hips flexed in standing required min-mod +2 to achieve hip extension to place plates on sara stedy.  Ambulation/Gait Ambulation/Gait assistance: (NT focused on standing activities in sara stedy.)               Stairs             Wheelchair Mobility    Modified Rankin (Stroke Patients Only)       Balance Overall balance assessment: Needs assistance Sitting-balance support: Bilateral upper extremity supported;Feet supported Sitting balance-Leahy Scale: Poor Sitting balance - Comments: leaning back and to L sitting edge of bed. Min A to mod A for balance/support. performed reaching to R and forward x 20 reps at various heights to improve balance and weight shifting. Postural control: Posterior lean Standing balance support: Bilateral upper extremity supported;During functional activity Standing balance-Leahy Scale: Poor Standing balance comment: Cues for forward weight shifting                            Cognition Arousal/Alertness: Awake/alert Behavior During Therapy: WFL for tasks assessed/performed;Impulsive Overall Cognitive Status: Impaired/Different from baseline Area of Impairment: Orientation;Memory;Following commands;Safety/judgement;Problem solving                 Orientation Level: Disoriented to;Place;Time;Situation   Memory: Decreased recall of precautions;Decreased short-term memory Following Commands: Follows one step commands inconsistently;Follows one step commands with increased time Safety/Judgement: Decreased awareness of safety;Decreased awareness of deficits   Problem Solving: Slow processing;Decreased initiation;Difficulty sequencing;Requires  verbal cues;Requires tactile cues General Comments: Pt remains confused, he reports he is not interested in eating anything and only needs a "shot of liquor."  Wife reports he does not drink.  He also [place] and he was put here so  doctors can "make some damn money."      Exercises General Exercises - Lower Extremity Mini-Sqauts: Both;10 reps;AROM;Standing(in sara stedy with support on cross bar with B UEs.)    General Comments        Pertinent Vitals/Pain Pain Assessment: Faces Faces Pain Scale: No hurt    Home Living                      Prior Function            PT Goals (current goals can now be found in the care plan section) Acute Rehab PT Goals Patient Stated Goal: to go home Potential to Achieve Goals: Fair Progress towards PT goals: Progressing toward goals    Frequency    Min 3X/week      PT Plan Current plan remains appropriate    Co-evaluation              AM-PAC PT "6 Clicks" Mobility   Outcome Measure  Help needed turning from your back to your side while in a flat bed without using bedrails?: Total Help needed moving from lying on your back to sitting on the side of a flat bed without using bedrails?: Total Help needed moving to and from a bed to a chair (including a wheelchair)?: Total Help needed standing up from a chair using your arms (e.g., wheelchair or bedside chair)?: Total Help needed to walk in hospital room?: Total Help needed climbing 3-5 steps with a railing? : Total 6 Click Score: 6    End of Session Equipment Utilized During Treatment: Gait belt Activity Tolerance: Patient tolerated treatment well Patient left: in chair;with chair alarm set;with call bell/phone within reach;with family/visitor present Nurse Communication: Mobility status;Need for lift equipment PT Visit Diagnosis: Muscle weakness (generalized) (M62.81);Difficulty in walking, not elsewhere classified (R26.2)     Time: 5643-3295 PT Time Calculation (min) (ACUTE ONLY): 36 min  Charges:  $Therapeutic Activity: 23-37 mins                     Governor Rooks, PTA Acute Rehabilitation Services Pager [phone] Office [phone]     Makhai Fulco Eli Hose 02/19/2019, 12:58  PM

## 2019-02-19 NOTE — TOC Progression Note (Addendum)
Transition of Care [facility]) - Progression Note    Patient Details  Name: [name] MRN: [id number] Date of Birth: 1941-06-14  [facility] CM/SW Contact  Jacinto Keil, Edson Snowball, RN Phone Number: 02/19/2019, 7:59 AM  Clinical Narrative:     Patient's wife called NCM back last evening after 5 pm . She has decided , that she her husband to go to short term rehab at a SNF prior to returning home. Her first choice is [facility]. Faxed FL2, will call Miquel Dunn.  [place] and left message for Spero Curb  Expected Discharge Plan: [place] Barriers to Discharge: Continued Medical Work up  Expected Discharge Plan and Services Expected Discharge Plan: [place]   Discharge Planning Services: CM Consult Post Acute Care Choice: [place] arrangements for the past 2 months: [place]: [facility] Date [place]: 02/16/19 Time [place]: 1608 Representative spoke with at [place]: Cassie   Social Determinants of Health ([place]) Interventions    Readmission Risk Interventions No flowsheet data found.

## 2019-02-19 NOTE — Progress Notes (Signed)
PROGRESS NOTE  [name] ZOX:[id number] DOB: [birth year] DOA: 02/13/2019 PCP: Cassandria Anger, MD  Brief History   [name]  is a 78 y.o. male,   w hypertension, hyperlipidemia, w new diagnosis of parkinsons dementia apparently presents with c/o decrease oral intake since Wednesday.  Pt started to have fever yesterday am per his wife.  Wife is providing the history .  Pt is only axox1 (person), not place or time.  Pt making urine.  No complaints of dysuria. Might have had increase in urinary frequency.  Per wife, no fever, chills, no cp, no palp, no sob, no n/v, no abd pain, no diarrhea, no brbpr, no hematuria, no flank pain.  Pt brought to ED due to fever.   In the ED he was found to have acute renal failure with mild rhabdomyolysis.   [facility] were consulted to admit the patient for further evaluation and treatment. This was likely due to sepsis of unknown source. Blood cultures were obtained which have since grown out due klebsiella pneumonia. He has been started on IV ceftriaxone. Surveillance cultures have been collected.  The patient has been evaluated by PT/OT. They have recommended SNF. The wife initially wanted to take him home, but she realizes that he will be difficult to manage at home as weak as he is.  Consultants  . None  Procedures  . None  Antibiotics   Anti-infectives (From admission, onward)   Start     Dose/Rate Route Frequency Ordered Stop   02/18/19 2200  cephALEXin (KEFLEX) capsule 500 mg  Status:  Discontinued     500 mg Oral Every 12 hours 02/18/19 1335 02/18/19 1339   02/18/19 2000  ciprofloxacin (CIPRO) tablet 500 mg  Status:  Discontinued     500 mg Oral 2 times daily 02/18/19 1258 02/18/19 1334   02/18/19 1400  cephALEXin (KEFLEX) capsule 500 mg     500 mg Oral Every 8 hours 02/18/19 1339     02/18/19 1200  ceFAZolin (ANCEF) IVPB 2g/100 mL premix  Status:  Discontinued     2 g 200 mL/hr over 30 Minutes Intravenous Every 8 hours  02/18/19 0927 02/18/19 1259   02/16/19 0930  ceFAZolin (ANCEF) IVPB 1 g/50 mL premix  Status:  Discontinued     1 g 100 mL/hr over 30 Minutes Intravenous Every 8 hours 02/16/19 0921 02/18/19 0927   02/14/19 1500  ceFEPIme (MAXIPIME) 2 g in sodium chloride 0.9 % 100 mL IVPB  Status:  Discontinued     2 g 200 mL/hr over 30 Minutes Intravenous Every 24 hours 02/13/19 1512 02/14/19 1028   02/14/19 1200  cefTRIAXone (ROCEPHIN) 2 g in sodium chloride 0.9 % 100 mL IVPB  Status:  Discontinued     2 g 200 mL/hr over 30 Minutes Intravenous Every 24 hours 02/14/19 1028 02/16/19 0932   02/13/19 1511  vancomycin variable dose per unstable renal function (pharmacist dosing)  Status:  Discontinued      Does not apply See admin instructions 02/13/19 1512 02/14/19 1028   02/13/19 1415  ceFEPIme (MAXIPIME) 2 g in sodium chloride 0.9 % 100 mL IVPB     2 g 200 mL/hr over 30 Minutes Intravenous  Once 02/13/19 1404 02/13/19 1633   02/13/19 1415  metroNIDAZOLE (FLAGYL) IVPB 500 mg     500 mg 100 mL/hr over 60 Minutes Intravenous  Once 02/13/19 1404 02/13/19 1633   02/13/19 1415  vancomycin (VANCOCIN) IVPB 1000 mg/200 mL premix  Status:  Discontinued  1,000 mg 200 mL/hr over 60 Minutes Intravenous  Once 02/13/19 1404 02/13/19 1407   02/13/19 1415  vancomycin (VANCOCIN) 1,750 mg in sodium chloride 0.9 % 500 mL IVPB  Status:  Discontinued     1,750 mg 250 mL/hr over 120 Minutes Intravenous  Once 02/13/19 1407 02/14/19 1028     .   Subjective  The patient is sitting up at bedside. He is very confused and is requiring a Comptroller.  Objective   Vitals:  Vitals:   02/19/19 0744 02/19/19 1605  BP: (!) 164/81 130/66  Pulse: 72 74  Resp:    Temp:  99.3 F (37.4 C)  SpO2:  99%    Exam:  Constitutional:  The patient is awake and alert. He is very confused. Respiratory:  . There is no increased work of breathing. . No wheezes, rales, or rhonchi . No tactile fremitus. Cardiovascular:  . Regular rate  and rhythm. . No murmurs, ectopy or gallups. . No lateral PMI. No thrills. Abdomen:  . Abdomen is soft, non-tender, non-distended . No hernias, masses, or hernias are appreciated. . Normoactive bowel sounds.  Musculoskeletal:  . No cyanosis, clubbing, or edema. Skin:  . No rashes or lesions . Positve for pressure ulcer on sacrum . palpation of skin: no induration or nodules Neurologic:  . CN 2-12 intact . Sensation all 4 extremities intact Psychiatric:  . Unable to evaluate.  I have personally reviewed the following:   Today's Data  . Vitals, CMP  Micro Data  . Blood cultures.  Scheduled Meds: . carbidopa-levodopa  0.5 tablet Oral TID  . cephALEXin  500 mg Oral Q8H  . heparin  5,000 Units Subcutaneous Q8H     Principal Problem:   ARF (acute renal failure) (HCC) Active Problems:   Essential hypertension   Dyslipidemia   Sepsis (HCC)   Protein-calorie malnutrition, severe (HCC)   Dementia due to Parkinson's disease without behavioral disturbance (HCC)   Parkinson's disease (HCC)   Pressure injury of skin   LOS: 6 days   A & P   Acute renal failure: Creatinine is down from 4.65 to 1.91. Likely of mixed etiology -pre-renal and rhabdomyolysis.. Renal ultrasound was negative for hydronephrosis. CK trending downward. Continue cautious IV fluids. Avoid nephrotoxic substances and hypotension. SPEP, immunofixation, are pending. Lotrel and lasix are being held. The patient has a foley catheter for accurate urine collection and quantification.  Delirium. Mobilize. DC sedating meds.   Mild rhabdomyolysis: Due to sepsis. Hydrate, monitor, CK is trending downward. Monitor.  Sepsis: Based upon fever, hypotension, lactic acid of 2.0, leukocytosis, and tachycardia on admission. Now resolved.  Gram negative bacteremia: 1/2 blood cultures drawn on admission has grown out klebsiella pneumonia. The patient has been started on ceftriaxone pending sensitivities. Surveillance  cultures have been collected. They have had no growth left. The patient has been converted to oral antibiotics.  Severe protein calorie malnutrition: Consult nutrition. Protostat 30 mL's p.o. twice daily.  Parkinson's disease: Chronic. Speech therapy to evaluate swallowing. I spoke with neurology , they recommended starting low dose trial of sinemet and outpatient follow up.  Dementia secondary to Parkinson's disease:  MRI Brain 11/23/2018 IMPRESSION: No acute or reversible finding. Atrophy of the cerebral hemispheres with temporal lobe predominance. Mild moderate chronic small-vessel change of the white matter. DC Trazodone due to somnolence  Thrombocytopenia (mild): Check LDH if high then consider calling oncology r/o TTP. Follow platelets. Probably due to sepsis.  I have seen and examined this patient myself. I  have spent 35 minutes in his evaluation and care.  DVT Prophylaxis:    Heparin and SCDs  Family Communication: Patient wife at bedside. Code Status:  FULL CODE, patient is w Gelene MinkLaverne Beal Disposition: Pt recommends SNF.   Marvena Tally, DO [facility] Direct contact: see [email or web address]  7PM-7AM contact night coverage as above 02/19/2019, 7:44 PM  LOS: 1 day

## 2019-02-19 NOTE — [place] FL2 (Signed)
[place] MEDICAID FL2 LEVEL OF CARE SCREENING TOOL     IDENTIFICATION  Patient Name: [name] Birthdate: 04/15/1941 Sex: male Admission Date (Current Location): 02/13/2019  [facility] [place]   Facility and Address:         Provider Number: [id number]  Attending Physician Name and Address:  Fran LowesSwayze, Ava, DO  Relative Name and Phone Number:  Gelene MinkLaverne Payette [phone]    Current Level of Care: Hospital Recommended Level of Care: Skilled Nursing Facility Prior Approval Number:    Date Approved/Denied:   PASRR Number: [id number]  Discharge Plan: SNF    Current Diagnoses: Patient Active Problem List   Diagnosis Date Noted  . Pressure injury of skin 02/14/2019  . ARF (acute renal failure) (HCC) 02/13/2019  . Sepsis (HCC) 02/13/2019  . Protein-calorie malnutrition, severe (HCC) 02/13/2019  . 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 Parkinson's disease (HCC) 02/13/2019  . Infected sebaceous cyst 12/04/2018  . Hallucinations 11/19/2018  . Shoulder pain 11/19/2018  . Vitamin B12 deficiency 11/19/2018  . 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 Memory loss 09/21/2018  . Falls frequently 09/21/2018  . Fatigue 10/27/2017  . Edema 07/29/2017  . Chronic venous insufficiency 06/30/2017  . Cellulitis and abscess of leg, except foot 06/30/2017  . Ear pain, referred, right 06/10/2016  . Urinary frequency 04/16/2016  . Cerumen impaction 08/22/2015  . Infection of right hand due to bite 04/20/2014  . Hemifacial spasm 09/15/2013  . Well adult exam 07/16/2013  . Dermatochalasis of eyelid 01/01/2012  . Other facial nerve disorders 01/01/2012  . 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 Twitching 11/04/2011  . Non-REM Parasomnia 10/14/2011  . Dog bite of left hand 08/19/2011  . Wound infection 08/19/2011  . Hypokalemia 12/10/2010  . Hyperglycemia 12/10/2010  . Dyslipidemia   . Benign prostatic hyperplasia    . LEG CRAMPS 03/07/2009  . OTITIS EXTERNA 08/30/2008  . Prostatitis 08/30/2008  . Essential hypertension 08/26/2007  . ERECTILE DYSFUNCTION 08/26/2007    Orientation RESPIRATION BLADDER Height & Weight     Self  Normal Continent Weight: 83 kg Height:  5\' 10"  (177.8 cm)  BEHAVIORAL SYMPTOMS/MOOD NEUROLOGICAL BOWEL NUTRITION STATUS      Continent Diet(Dysphagia)  AMBULATORY STATUS COMMUNICATION OF NEEDS Skin   Extensive Assist Verbally Other (Comment)(Stage 1 Pressure inury rt buttock , foam dressing)                       Personal Care Assistance Level of Assistance  Bathing, Dressing Bathing Assistance: Maximum assistance   Dressing Assistance: Maximum assistance     Functional Limitations Info             SPECIAL CARE FACTORS FREQUENCY  PT (By licensed PT), OT (By licensed OT), Speech therapy     PT Frequency: 5 times a week OT Frequency: 5 times a week     Speech Therapy Frequency: 2 times a week      Contractures Contractures Info: Not present    Additional Factors Info  Code Status, Allergies Code Status Info: full code Allergies Info: Atorvastation , Penicillins, Sulfonamide Derivatives , Sulfa antibiotics, amlodipine, telminisartan           Current Medications (02/19/2019):  This is the current hospital active medication list Current Facility-Administered Medications  Medication Dose Route Frequency Provider Last Rate Last Dose  . acetaminophen (TYLENOL) tablet 650 mg  650 mg Oral Q6H PRN Pearson GrippeKim, James,  MD       Or  . acetaminophen (TYLENOL) suppository 650 mg  650 mg Rectal Q6H PRN Jani Gravel, MD      . carbidopa-levodopa (SINEMET IR) 25-100 MG per tablet immediate release 0.5 tablet  0.5 tablet Oral TID Jani Gravel, MD   0.5 tablet at 02/18/19 2115  . cephALEXin (KEFLEX) capsule 500 mg  500 mg Oral Q8H Swayze, Ava, DO   500 mg at 02/19/19 0518  . heparin injection 5,000 Units  5,000 Units Subcutaneous Q8H Jani Gravel, MD   5,000 Units at 02/19/19  [id number]     Discharge Medications: Please see discharge summary for a list of discharge medications.  Relevant Imaging Results:  Relevant Lab Results:   Additional Information SSI [id number]  [place] Dittus, Edson Snowball, RN

## 2019-02-19 NOTE — Progress Notes (Signed)
Paged on call MD about patient's B/P of 179/78 and did not receive a response.

## 2019-02-19 NOTE — TOC Progression Note (Addendum)
Transition of Care [facility]) - Progression Note    Patient Details  Name: JRU PENSE MRN: [id number] Date of Birth: 03-05-1941  [facility] CM/SW Contact  Mckenna Gamm, Edson Snowball, RN Phone Number: 02/19/2019, 10:46 AM  Clinical Narrative:     Linus Orn at [facility] offered a bed and will start insurance authorization today . Patient will need rapid covid test Sunday 02/21/19. DR Swayze aware.  Called left message for patient's wife.   Cassie with [facility] aware and will follow at [facility].   Patient wife now at bedside. Voiced understanding to all of above.   Explained to bedside nurse [facility] patient has to be sitter free 24 hours before going to SNF, [facility] will discontinue order for sitter.   Mrs Paci aware no visiting allowed at [facility]   Expected Discharge Plan: [place] Barriers to Discharge: Continued Medical Work up  Expected Discharge Plan and Services Expected Discharge Plan: [place]   Discharge Planning Services: CM Consult Post Acute Care Choice: [place] arrangements for the past 2 months: [place]: [facility] Date [place]: 02/16/19 Time [place]: 1608 Representative spoke with at [place]: Cassie   Social Determinants of Health (English) Interventions    Readmission Risk Interventions No flowsheet data found.

## 2019-02-19 NOTE — Progress Notes (Signed)
Patient's daughter called and was updated on her father's plan of care.  All questions were answered to her satisfaction.

## 2019-02-19 NOTE — Progress Notes (Signed)
Pt refused breakfast, trying to get out of bed, pulling his lines and condom cath, reposition, reorient, reeducate, his wife was aware, paged Dr. Benny Lennert for tele sitter order for safety.

## 2019-02-20 LAB — COMPREHENSIVE METABOLIC PANEL
ALT: 6 U/L (ref 0–44)
AST: 13 U/L — ABNORMAL LOW (ref 15–41)
Albumin: 1.9 g/dL — ABNORMAL LOW (ref 3.5–5.0)
Alkaline Phosphatase: 54 U/L (ref 38–126)
Anion gap: 8 (ref 5–15)
BUN: 30 mg/dL — ABNORMAL HIGH (ref 8–23)
CO2: 25 mmol/L (ref 22–32)
Calcium: 8.1 mg/dL — ABNORMAL LOW (ref 8.9–10.3)
Chloride: 106 mmol/L (ref 98–111)
Creatinine, Ser: 1.39 mg/dL — ABNORMAL HIGH (ref 0.61–1.24)
GFR calc Af Amer: 55 mL/min — ABNORMAL LOW (ref >60)
GFR calc non Af Amer: 48 mL/min — ABNORMAL LOW (ref >60)
Glucose, Bld: 112 mg/dL — ABNORMAL HIGH (ref 70–99)
Potassium: 3.2 mmol/L — ABNORMAL LOW (ref 3.5–5.1)
Sodium: 139 mmol/L (ref 135–145)
Total Bilirubin: 0.8 mg/dL (ref 0.3–1.2)
Total Protein: 4.7 g/dL — ABNORMAL LOW (ref 6.5–8.1)

## 2019-02-20 LAB — CBC WITH DIFFERENTIAL/PLATELET
Abs Immature Granulocytes: 0.21 10*3/uL — ABNORMAL HIGH (ref 0.00–0.07)
Basophils Absolute: 0.1 10*3/uL (ref 0.0–0.1)
Basophils Relative: 1 %
Eosinophils Absolute: 0.1 10*3/uL (ref 0.0–0.5)
Eosinophils Relative: 2 %
HCT: 36.1 % — ABNORMAL LOW (ref 39.0–52.0)
Hemoglobin: 12 g/dL — ABNORMAL LOW (ref 13.0–17.0)
Immature Granulocytes: 3 %
Lymphocytes Relative: 16 %
Lymphs Abs: 1 10*3/uL (ref 0.7–4.0)
MCH: 29.3 pg (ref 26.0–34.0)
MCHC: 33.2 g/dL (ref 30.0–36.0)
MCV: 88 fL (ref 80.0–100.0)
Monocytes Absolute: 0.5 10*3/uL (ref 0.1–1.0)
Monocytes Relative: 8 %
Neutro Abs: 4.5 10*3/uL (ref 1.7–7.7)
Neutrophils Relative %: 70 %
Platelets: 214 10*3/uL (ref 150–400)
RBC: 4.1 MIL/uL — ABNORMAL LOW (ref 4.22–5.81)
RDW: 12.3 % (ref 11.5–15.5)
WBC: 6.5 10*3/uL (ref 4.0–10.5)
nRBC: 0 % (ref 0.0–0.2)

## 2019-02-20 LAB — NOVEL CORONAVIRUS, NAA (HOSP ORDER, SEND-OUT TO REF LAB; TAT 18-24 HRS): SARS-CoV-2, NAA: NOT DETECTED

## 2019-02-20 MED ORDER — POTASSIUM CHLORIDE 10 MEQ/100ML IV SOLN
10.0000 meq | INTRAVENOUS | Status: AC
  Administered 2019-02-20 (×3): 10 meq via INTRAVENOUS
  Filled 2019-02-20 (×3): qty 100

## 2019-02-20 MED ORDER — HYDRALAZINE HCL 20 MG/ML IJ SOLN: 5.0000 mg | Freq: Three times a day (TID) | INTRAMUSCULAR | Status: DC | PRN

## 2019-02-20 MED ORDER — POTASSIUM CHLORIDE 10 MEQ/100ML IV SOLN
10.0000 meq | Freq: Once | INTRAVENOUS | Status: AC
  Administered 2019-02-20: 10 meq via INTRAVENOUS
  Filled 2019-02-20: qty 100

## 2019-02-20 NOTE — Progress Notes (Signed)
PROGRESS NOTE  [name] RCV:[id number] DOB: August 16, 1940 DOA: 02/13/2019 PCP: [name], MD  Brief History   [name]  is a 78 y.o. male,   w hypertension, hyperlipidemia, w new diagnosis of parkinsons dementia apparently presents with c/o decrease oral intake since Wednesday.  Pt started to have fever yesterday am per his wife.  Wife is providing the history .  Pt is only axox1 (person), not place or time.  Pt making urine.  No complaints of dysuria. Might have had increase in urinary frequency.  Per wife, no fever, chills, no cp, no palp, no sob, no n/v, no abd pain, no diarrhea, no brbpr, no hematuria, no flank pain.  Pt brought to ED due to fever.   In the ED he was found to have acute renal failure with mild rhabdomyolysis.   [facility] were consulted to admit the patient for further evaluation and treatment. This was likely due to sepsis of unknown source. Blood cultures were obtained which have since grown out due klebsiella pneumonia. He has been started on IV ceftriaxone. Surveillance cultures have been collected.  The patient has been evaluated by PT/OT. They have recommended SNF. The wife initially wanted to take him home, but she realizes that he will be difficult to manage at home as weak as he is. He is awaiting SNF placement.  Consultants  . None  Procedures  . None  Antibiotics   Anti-infectives (From admission, onward)   Start     Dose/Rate Route Frequency Ordered Stop   02/18/19 2200  cephALEXin (KEFLEX) capsule 500 mg  Status:  Discontinued     500 mg Oral Every 12 hours 02/18/19 1335 02/18/19 1339   02/18/19 2000  ciprofloxacin (CIPRO) tablet 500 mg  Status:  Discontinued     500 mg Oral 2 times daily 02/18/19 1258 02/18/19 1334   02/18/19 1400  cephALEXin (KEFLEX) capsule 500 mg     500 mg Oral Every 8 hours 02/18/19 1339     02/18/19 1200  ceFAZolin (ANCEF) IVPB 2g/100 mL premix  Status:  Discontinued     2 g 200 mL/hr over 30 Minutes  Intravenous Every 8 hours 02/18/19 0927 02/18/19 1259   02/16/19 0930  ceFAZolin (ANCEF) IVPB 1 g/50 mL premix  Status:  Discontinued     1 g 100 mL/hr over 30 Minutes Intravenous Every 8 hours 02/16/19 0921 02/18/19 0927   02/14/19 1500  ceFEPIme (MAXIPIME) 2 g in sodium chloride 0.9 % 100 mL IVPB  Status:  Discontinued     2 g 200 mL/hr over 30 Minutes Intravenous Every 24 hours 02/13/19 1512 02/14/19 1028   02/14/19 1200  cefTRIAXone (ROCEPHIN) 2 g in sodium chloride 0.9 % 100 mL IVPB  Status:  Discontinued     2 g 200 mL/hr over 30 Minutes Intravenous Every 24 hours 02/14/19 1028 02/16/19 0932   02/13/19 1511  vancomycin variable dose per unstable renal function (pharmacist dosing)  Status:  Discontinued      Does not apply See admin instructions 02/13/19 1512 02/14/19 1028   02/13/19 1415  ceFEPIme (MAXIPIME) 2 g in sodium chloride 0.9 % 100 mL IVPB     2 g 200 mL/hr over 30 Minutes Intravenous  Once 02/13/19 1404 02/13/19 1633   02/13/19 1415  metroNIDAZOLE (FLAGYL) IVPB 500 mg     500 mg 100 mL/hr over 60 Minutes Intravenous  Once 02/13/19 1404 02/13/19 1633   02/13/19 1415  vancomycin (VANCOCIN) IVPB 1000 mg/200 mL  premix  Status:  Discontinued     1,000 mg 200 mL/hr over 60 Minutes Intravenous  Once 02/13/19 1404 02/13/19 1407   02/13/19 1415  vancomycin (VANCOCIN) 1,750 mg in sodium chloride 0.9 % 500 mL IVPB  Status:  Discontinued     1,750 mg 250 mL/hr over 120 Minutes Intravenous  Once 02/13/19 1407 02/14/19 1028     .   Subjective  The patient is sitting up in a chair at bedside today. He is much less confused than yesterday.  Objective   Vitals:  Vitals:   02/20/19 0522 02/20/19 1329  BP: (!) 164/74 (!) 112/56  Pulse: 65 71  Resp:  18  Temp: 98.3 F (36.8 C) 98.1 F (36.7 C)  SpO2: 99% 98%    Exam:  Constitutional:  The patient is awake and alert. According to his wife he is in and out of confusion today. No acute distress. Respiratory:  . There is no  increased work of breathing. . No wheezes, rales, or rhonchi . No tactile fremitus. Cardiovascular:  . Regular rate and rhythm. . No murmurs, ectopy or gallups. . No lateral PMI. No thrills. Abdomen:  . Abdomen is soft, non-tender, non-distended . No hernias, masses, or hernias are appreciated. . Normoactive bowel sounds.  Musculoskeletal:  . No cyanosis, clubbing, or edema. Skin:  . No rashes or lesions . Positve for pressure ulcer on sacrum . palpation of skin: no induration or nodules Neurologic:  . CN 2-12 intact . Sensation all 4 extremities intact Psychiatric:  . Unable to evaluate.  I have personally reviewed the following:   Today's Data  . Vitals, CMP, CBC  Micro Data  . Blood cultures.  Scheduled Meds: . carbidopa-levodopa  0.5 tablet Oral TID  . cephALEXin  500 mg Oral Q8H  . heparin  5,000 Units Subcutaneous Q8H     Principal Problem:   ARF (acute renal failure) (HCC) Active Problems:   Essential hypertension   Dyslipidemia   Sepsis (HCC)   Protein-calorie malnutrition, severe (HCC)   Dementia due to Parkinson's disease without behavioral disturbance (HCC)   Parkinson's disease (HCC)   Pressure injury of skin   LOS: 7 days   A & P   Acute renal failure: Creatinine is down from 4.65 to 1.39. Likely of mixed etiology -pre-renal and rhabdomyolysis.. Renal ultrasound was negative for hydronephrosis. CK trending downward. Continue cautious IV fluids. Avoid nephrotoxic substances and hypotension. SPEP, immunofixation, are pending. Lotrel and lasix are being held. The patient has a foley catheter for accurate urine collection and quantification.  Delirium. Mobilize. DC sedating meds.   Mild rhabdomyolysis: Due to sepsis. Hydrate, monitor, CK is trending downward. Monitor.  Sepsis: Based upon fever, hypotension, lactic acid of 2.0, leukocytosis, and tachycardia on admission. Now resolved.  Gram negative bacteremia: 1/2 blood cultures drawn on  admission has grown out klebsiella pneumonia. The patient has been started on ceftriaxone pending sensitivities. Surveillance cultures have been collected. They have had no growth left. The patient has been converted to oral antibiotics.  Severe protein calorie malnutrition: Consult nutrition. Protostat 30 mL's p.o. twice daily.  Parkinson's disease: Chronic. Speech therapy to evaluate swallowing. I spoke with neurology , they recommended starting low dose trial of sinemet and outpatient follow up.  Dementia secondary to Parkinson's disease:  MRI Brain 11/23/2018 IMPRESSION: No acute or reversible finding. Atrophy of the cerebral hemispheres with temporal lobe predominance. Mild moderate chronic small-vessel change of the white matter. DC Trazodone due to somnolence  Thrombocytopenia (  mild): Check LDH if high then consider calling oncology r/o TTP. Follow platelets. Probably due to sepsis.  I have seen and examined this patient myself. I have spent 32 minutes in his evaluation and care.  DVT Prophylaxis:    Heparin and SCDs  Family Communication: Patient wife at bedside. Code Status:  FULL CODE, patient is w Nani Gasser Disposition: Pt recommends SNF.   Antoin Dargis, DO [facility] Direct contact: see [email or web address]  7PM-7AM contact night coverage as above 02/20/2019, 5:00 PM  LOS: 1 day

## 2019-02-20 NOTE — Progress Notes (Signed)
Notified MD of pt's BP running 170/75. New orders received.

## 2019-02-21 LAB — CULTURE, BLOOD (ROUTINE X 2)
Culture: NO GROWTH
Culture: NO GROWTH
Special Requests: ADEQUATE

## 2019-02-21 LAB — COMPREHENSIVE METABOLIC PANEL
ALT: 5 U/L (ref 0–44)
AST: 12 U/L — ABNORMAL LOW (ref 15–41)
Albumin: 1.8 g/dL — ABNORMAL LOW (ref 3.5–5.0)
Alkaline Phosphatase: 50 U/L (ref 38–126)
Anion gap: 11 (ref 5–15)
BUN: 22 mg/dL (ref 8–23)
CO2: 25 mmol/L (ref 22–32)
Calcium: 8.2 mg/dL — ABNORMAL LOW (ref 8.9–10.3)
Chloride: 104 mmol/L (ref 98–111)
Creatinine, Ser: 1.29 mg/dL — ABNORMAL HIGH (ref 0.61–1.24)
GFR calc Af Amer: >60 mL/min (ref >60)
GFR calc non Af Amer: 52 mL/min — ABNORMAL LOW (ref >60)
Glucose, Bld: 109 mg/dL — ABNORMAL HIGH (ref 70–99)
Potassium: 3.5 mmol/L (ref 3.5–5.1)
Sodium: 140 mmol/L (ref 135–145)
Total Bilirubin: 0.6 mg/dL (ref 0.3–1.2)
Total Protein: 4.5 g/dL — ABNORMAL LOW (ref 6.5–8.1)

## 2019-02-21 NOTE — Progress Notes (Signed)
PROGRESS NOTE  [name] QIH:[id number] DOB: 07/26/1940 DOA: 02/13/2019 PCP: Cassandria Anger, MD  Brief History   [name]  is a 78 y.o. male,   w hypertension, hyperlipidemia, w new diagnosis of parkinsons dementia apparently presents with c/o decrease oral intake since Wednesday.  Pt started to have fever yesterday am per his wife.  Wife is providing the history .  Pt is only axox1 (person), not place or time.  Pt making urine.  No complaints of dysuria. Might have had increase in urinary frequency.  Per wife, no fever, chills, no cp, no palp, no sob, no n/v, no abd pain, no diarrhea, no brbpr, no hematuria, no flank pain.  Pt brought to ED due to fever.   In the ED he was found to have acute renal failure with mild rhabdomyolysis.   [facility] were consulted to admit the patient for further evaluation and treatment. This was likely due to sepsis of unknown source. Blood cultures were obtained which have since grown out due klebsiella pneumonia. He has been started on IV ceftriaxone. Surveillance cultures have been collected.  The patient has been evaluated by PT/OT. They have recommended SNF. The wife initially wanted to take him home, but she realizes that he will be difficult to manage at home as weak as he is. He is awaiting SNF placement.  02/21/2019: Patient seen.  Awaiting skilled nursing facility placement.  No new changes.  Renal function is back to baseline.  Complete course of antibiotics.  Consultants  . None  Procedures  . None  Antibiotics   Anti-infectives (From admission, onward)   Start     Dose/Rate Route Frequency Ordered Stop   02/18/19 2200  cephALEXin (KEFLEX) capsule 500 mg  Status:  Discontinued     500 mg Oral Every 12 hours 02/18/19 1335 02/18/19 1339   02/18/19 2000  ciprofloxacin (CIPRO) tablet 500 mg  Status:  Discontinued     500 mg Oral 2 times daily 02/18/19 1258 02/18/19 1334   02/18/19 1400  cephALEXin (KEFLEX) capsule 500 mg      500 mg Oral Every 8 hours 02/18/19 1339     02/18/19 1200  ceFAZolin (ANCEF) IVPB 2g/100 mL premix  Status:  Discontinued     2 g 200 mL/hr over 30 Minutes Intravenous Every 8 hours 02/18/19 0927 02/18/19 1259   02/16/19 0930  ceFAZolin (ANCEF) IVPB 1 g/50 mL premix  Status:  Discontinued     1 g 100 mL/hr over 30 Minutes Intravenous Every 8 hours 02/16/19 0921 02/18/19 0927   02/14/19 1500  ceFEPIme (MAXIPIME) 2 g in sodium chloride 0.9 % 100 mL IVPB  Status:  Discontinued     2 g 200 mL/hr over 30 Minutes Intravenous Every 24 hours 02/13/19 1512 02/14/19 1028   02/14/19 1200  cefTRIAXone (ROCEPHIN) 2 g in sodium chloride 0.9 % 100 mL IVPB  Status:  Discontinued     2 g 200 mL/hr over 30 Minutes Intravenous Every 24 hours 02/14/19 1028 02/16/19 0932   02/13/19 1511  vancomycin variable dose per unstable renal function (pharmacist dosing)  Status:  Discontinued      Does not apply See admin instructions 02/13/19 1512 02/14/19 1028   02/13/19 1415  ceFEPIme (MAXIPIME) 2 g in sodium chloride 0.9 % 100 mL IVPB     2 g 200 mL/hr over 30 Minutes Intravenous  Once 02/13/19 1404 02/13/19 1633   02/13/19 1415  metroNIDAZOLE (FLAGYL) IVPB 500 mg  500 mg 100 mL/hr over 60 Minutes Intravenous  Once 02/13/19 1404 02/13/19 1633   02/13/19 1415  vancomycin (VANCOCIN) IVPB 1000 mg/200 mL premix  Status:  Discontinued     1,000 mg 200 mL/hr over 60 Minutes Intravenous  Once 02/13/19 1404 02/13/19 1407   02/13/19 1415  vancomycin (VANCOCIN) 1,750 mg in sodium chloride 0.9 % 500 mL IVPB  Status:  Discontinued     1,750 mg 250 mL/hr over 120 Minutes Intravenous  Once 02/13/19 1407 02/14/19 1028     .   Subjective  The patient is sitting up in a chair at bedside today. He is much less confused than yesterday.  Objective   Vitals:  Vitals:   02/21/19 0501 02/21/19 1316  BP: (!) 160/78 (!) 146/66  Pulse: 72 63  Resp: 17 16  Temp: 98.2 F (36.8 C) 98.8 F (37.1 C)  SpO2: 99% 98%    Exam:   Constitutional:  Patient is not in any obvious distress.  Patient remains very mildly confused. Respiratory:  . Decreased air entry. Cardiovascular:  . S1-S2.   Abdomen:  . Abdomen is obese, soft, non-tender. . Organs are difficult to assess. Neurologic:  . Patient is awake and alert.   Is all extremities.    Today's Data  . Vitals, CMP, CBC  Micro Data  . Blood cultures.  Scheduled Meds: . carbidopa-levodopa  0.5 tablet Oral TID  . cephALEXin  500 mg Oral Q8H  . heparin  5,000 Units Subcutaneous Q8H     Principal Problem:   ARF (acute renal failure) (HCC) Active Problems:   Essential hypertension   Dyslipidemia   Sepsis (HCC)   Protein-calorie malnutrition, severe (HCC)   Dementia due to Parkinson's disease without behavioral disturbance (HCC)   Parkinson's disease (HCC)   Pressure injury of skin   LOS: 8 days   A & P   Acute renal failure: Creatinine is down from 4.65 to 1.39. Likely of mixed etiology -pre-renal and rhabdomyolysis.. Renal ultrasound was negative for hydronephrosis. CK trending downward. Continue cautious IV fluids. Avoid nephrotoxic substances and hypotension. SPEP, immunofixation, are pending. Lotrel and lasix are being held. The patient has a foley catheter for accurate urine collection and quantification. 02/21/2019: AKI is back to baseline.  Delirium. Mobilize. DC sedating meds.   Mild rhabdomyolysis:  Resolved.  Last CPK was on 02/15/2019 and the level was 264.    Sepsis: Based upon fever, hypotension, lactic acid of 2.0, leukocytosis, and tachycardia on admission. Now resolved. 02/21/2019: Complete course of antibiotics.  Gram negative bacteremia: 1/2 blood cultures drawn on admission has grown out klebsiella pneumonia. The patient has been started on ceftriaxone pending sensitivities. Surveillance cultures have been collected. They have had no growth left. The patient has been converted to oral antibiotics. 02/21/2019: Complete course  of antibiotics.  Severe protein calorie malnutrition: Consult nutrition. Protostat 30 mL's p.o. twice daily.  Parkinson's disease: Chronic. Speech therapy to evaluate swallowing. I spoke with neurology , they recommended starting low dose trial of sinemet and outpatient follow up.  Dementia secondary to Parkinson's disease:  MRI Brain 11/23/2018 IMPRESSION: No acute or reversible finding. Atrophy of the cerebral hemispheres with temporal lobe predominance. Mild moderate chronic small-vessel change of the white matter. DC Trazodone due to somnolence  Thrombocytopenia (mild): Check LDH if high then consider calling oncology r/o TTP. Follow platelets. Probably due to sepsis.   DVT Prophylaxis:    Heparin and SCDs  Family Communication: Patient wife at bedside. Code Status:  FULL CODE, patient is w Gelene MinkLaverne Hefferan Disposition: Pt recommends SNF.   Berton MountSylvester ogbata, MD.   [facility] Direct contact: see [email or web address]  7PM-7AM contact night coverage as above

## 2019-02-22 NOTE — Care Management Important Message (Signed)
Important Message  Patient Details  Name: [name] MRN: [id number] Date of Birth: [birth year]   Medicare Important Message Given:  Yes     Memory Argue 02/22/2019, 4:44 PM

## 2019-02-22 NOTE — Progress Notes (Signed)
Rehab Admissions Coordinator Note:  Per PT recommendation, patient was screened by Michel Santee for appropriateness for an Inpatient Acute Rehab Consult.  At this time, we are recommending Inpatient Rehab consult.  Please place a consult order if pt would like to be considered.   Michel Santee 02/22/2019, 4:59 PM  I can be reached at [phone].

## 2019-02-22 NOTE — Progress Notes (Signed)
PROGRESS NOTE  [name] DGU:[id number] DOB: 09/30/1940 DOA: 02/13/2019 PCP: Cassandria Anger, MD  Brief History   [name]  is a 78 y.o. male,   w hypertension, hyperlipidemia, w new diagnosis of parkinsons dementia apparently presents with c/o decrease oral intake since Wednesday.  Pt started to have fever yesterday am per his wife.  Wife is providing the history .  Pt is only axox1 (person), not place or time.  Pt making urine.  No complaints of dysuria. Might have had increase in urinary frequency.  Per wife, no fever, chills, no cp, no palp, no sob, no n/v, no abd pain, no diarrhea, no brbpr, no hematuria, no flank pain.  Pt brought to ED due to fever.   In the ED he was found to have acute renal failure with mild rhabdomyolysis.   [facility] were consulted to admit the patient for further evaluation and treatment. This was likely due to sepsis of unknown source. Blood cultures were obtained which have since grown out due klebsiella pneumonia. He has been started on IV ceftriaxone. Surveillance cultures have been collected.  The patient has been evaluated by PT/OT. They have recommended SNF. The wife initially wanted to take him home, but she realizes that he will be difficult to manage at home as weak as he is. He is awaiting SNF placement.  02/21/2019: Patient seen.  Awaiting skilled nursing facility placement.  No new changes.  Renal function is back to baseline.  Complete course of antibiotics. 02/22/2019: Patient seen alongside patient's wife.  AKI is back to baseline.  Patient is awaiting discharge to rehab.  Consultants  . None  Procedures  . None  Antibiotics   Anti-infectives (From admission, onward)   Start     Dose/Rate Route Frequency Ordered Stop   02/18/19 2200  cephALEXin (KEFLEX) capsule 500 mg  Status:  Discontinued     500 mg Oral Every 12 hours 02/18/19 1335 02/18/19 1339   02/18/19 2000  ciprofloxacin (CIPRO) tablet 500 mg  Status:  Discontinued      500 mg Oral 2 times daily 02/18/19 1258 02/18/19 1334   02/18/19 1400  cephALEXin (KEFLEX) capsule 500 mg     500 mg Oral Every 8 hours 02/18/19 1339     02/18/19 1200  ceFAZolin (ANCEF) IVPB 2g/100 mL premix  Status:  Discontinued     2 g 200 mL/hr over 30 Minutes Intravenous Every 8 hours 02/18/19 0927 02/18/19 1259   02/16/19 0930  ceFAZolin (ANCEF) IVPB 1 g/50 mL premix  Status:  Discontinued     1 g 100 mL/hr over 30 Minutes Intravenous Every 8 hours 02/16/19 0921 02/18/19 0927   02/14/19 1500  ceFEPIme (MAXIPIME) 2 g in sodium chloride 0.9 % 100 mL IVPB  Status:  Discontinued     2 g 200 mL/hr over 30 Minutes Intravenous Every 24 hours 02/13/19 1512 02/14/19 1028   02/14/19 1200  cefTRIAXone (ROCEPHIN) 2 g in sodium chloride 0.9 % 100 mL IVPB  Status:  Discontinued     2 g 200 mL/hr over 30 Minutes Intravenous Every 24 hours 02/14/19 1028 02/16/19 0932   02/13/19 1511  vancomycin variable dose per unstable renal function (pharmacist dosing)  Status:  Discontinued      Does not apply See admin instructions 02/13/19 1512 02/14/19 1028   02/13/19 1415  ceFEPIme (MAXIPIME) 2 g in sodium chloride 0.9 % 100 mL IVPB     2 g 200 mL/hr over 30 Minutes Intravenous  Once 02/13/19 1404 02/13/19 1633   02/13/19 1415  metroNIDAZOLE (FLAGYL) IVPB 500 mg     500 mg 100 mL/hr over 60 Minutes Intravenous  Once 02/13/19 1404 02/13/19 1633   02/13/19 1415  vancomycin (VANCOCIN) IVPB 1000 mg/200 mL premix  Status:  Discontinued     1,000 mg 200 mL/hr over 60 Minutes Intravenous  Once 02/13/19 1404 02/13/19 1407   02/13/19 1415  vancomycin (VANCOCIN) 1,750 mg in sodium chloride 0.9 % 500 mL IVPB  Status:  Discontinued     1,750 mg 250 mL/hr over 120 Minutes Intravenous  Once 02/13/19 1407 02/14/19 1028     .   Subjective  The patient is sitting up in a chair at bedside today. He is much less confused than yesterday.  Objective   Vitals:  Vitals:   02/22/19 0548 02/22/19 1500  BP: (!)  168/79 108/73  Pulse: 72 65  Resp:  18  Temp: 98.6 F (37 C) 98.4 F (36.9 C)  SpO2: 97% 98%    Exam:  Constitutional:  Patient is not in any obvious distress.  Patient remains very mildly confused. Respiratory:  . Decreased air entry. Cardiovascular:  . S1-S2.   Abdomen:  . Abdomen is obese, soft, non-tender. . Organs are difficult to assess. Neurologic:  . Patient is awake and alert.   Patient moves all extremities.      Today's Data  . Vitals, CMP, CBC  Micro Data  . Blood cultures.  Scheduled Meds: . carbidopa-levodopa  0.5 tablet Oral TID  . cephALEXin  500 mg Oral Q8H  . heparin  5,000 Units Subcutaneous Q8H     Principal Problem:   ARF (acute renal failure) (HCC) Active Problems:   Essential hypertension   Dyslipidemia   Sepsis (HCC)   Protein-calorie malnutrition, severe (HCC)   Dementia due to Parkinson's disease without behavioral disturbance (HCC)   Parkinson's disease (HCC)   Pressure injury of skin   LOS: 9 days   A & P   Acute renal failure: Creatinine is down from 4.65 to 1.39. Likely of mixed etiology -pre-renal and rhabdomyolysis.. Renal ultrasound was negative for hydronephrosis. CK trending downward. Continue cautious IV fluids. Avoid nephrotoxic substances and hypotension. SPEP, immunofixation, are pending. Lotrel and lasix are being held. The patient has a foley catheter for accurate urine collection and quantification. 02/22/2019: AKI is back to baseline.  Delirium: Resolved significantly. History of dementia reported by patient's wife.  Mild rhabdomyolysis:  Resolved.  Last CPK was on 02/15/2019 and the level was 264.    Sepsis: Based upon fever, hypotension, lactic acid of 2.0, leukocytosis, and tachycardia on admission. Now resolved. 02/22/2019: Complete course of antibiotics.  Gram negative bacteremia: 1/2 blood cultures drawn on admission has grown out klebsiella pneumonia. The patient has been started on ceftriaxone pending  sensitivities. Surveillance cultures have been collected. They have had no growth left. The patient has been converted to oral antibiotics. 02/22/2019: Complete course of antibiotics.  Severe protein calorie malnutrition: Consult nutrition. Protostat 30 mL's p.o. twice daily.  Parkinson's disease: Chronic. Speech therapy to evaluate swallowing. I spoke with neurology , they recommended starting low dose trial of sinemet and outpatient follow up.  Dementia secondary to Parkinson's disease:  No behavioral problems.   DVT Prophylaxis:    Heparin and SCDs  Family Communication: Patient wife at bedside. Code Status:  FULL CODE, patient is w [facility] Disposition: Pt recommends SNF.   Berton Mount, MD.   [facility] Direct contact: see  [email or web address]  7PM-7AM contact night coverage as above

## 2019-02-22 NOTE — Plan of Care (Signed)
  Problem: Education: Goal: Knowledge of General Education information will improve Description: Including pain rating scale, medication(s)/side effects and non-pharmacologic comfort measures Outcome: Progressing   Problem: Health Behavior/Discharge Planning: Goal: Ability to manage health-related needs will improve Outcome: Progressing   Problem: Clinical Measurements: Goal: Ability to maintain clinical measurements within normal limits will improve Outcome: Progressing   Problem: Coping: Goal: Level of anxiety will decrease Outcome: Progressing   Problem: Elimination: Goal: Will not experience complications related to urinary retention Outcome: Progressing   Problem: Pain Managment: Goal: General experience of comfort will improve Outcome: Progressing   Problem: Safety: Goal: Ability to remain free from injury will improve Outcome: Progressing   Problem: Skin Integrity: Goal: Risk for impaired skin integrity will decrease Outcome: Progressing   

## 2019-02-22 NOTE — TOC Progression Note (Signed)
Transition of Care [facility]) - Progression Note    Patient Details  Name: [name] MRN: [id number] Date of Birth: 12/31/1940  Transition [facility]) CM/SW Contact  Jacalyn Lefevre Edson Snowball, RN Phone Number: 02/22/2019, 10:02 AM  Clinical Narrative:     Checked with Linus Orn at [facility] , still awaiting insurance authorization. New covid test ordered for today .   Expected Discharge Plan: [place] Barriers to Discharge: Continued Medical Work up  Expected Discharge Plan and Services Expected Discharge Plan: [place]   Discharge Planning Services: CM Consult Post Acute Care Choice: [place] arrangements for the past 2 months: [place]: [facility] Date [place]: 02/16/19 Time [place]: 1608 Representative spoke with at [place]: Cassie   Social Determinants of Health ([place]) Interventions    Readmission Risk Interventions No flowsheet data found.

## 2019-02-22 NOTE — Progress Notes (Signed)
Physical Therapy Treatment Patient Details Name: [name] MRN: [id number] DOB: 07/17/1940 Today's Date: 02/22/2019    History of Present Illness Pt presents on 02/13/19 with decreased oral intake, new diagnosis of Parkinson's dementia & with PMH significant for HTN. Pt found to have ARF, mild rhabdomylosis and sespis.    PT Comments    Pt continues to progress with functional mobility and was able to initiate further gait training this session. Pt requires mod assist overall for boost up to standing and during gait with RW, increased assist needed during turns for RW management and safety. Pt requires frequent cues for upright posture/increased trunk extension and cues for sequencing/placement during all mobility. Based on patients change in functional status and ability to participate in therapy, recommending intensive CIR- level therapies to maximize independence with all functional mobility.    Follow Up Recommendations  CIR     Equipment Recommendations  Other (comment)(tbd)    Recommendations for Other Services Rehab consult     Precautions / Restrictions Precautions Precautions: Fall Restrictions Weight Bearing Restrictions: No    Mobility  Bed Mobility                  Transfers Overall transfer level: Needs assistance Equipment used: Rolling walker (2 wheeled) Transfers: Sit to/from Stand Sit to Stand: +2 physical assistance;Mod assist         General transfer comment: Cues for hand placement and forward weight shifting to perform sit<>stand from the recliner.  Mod assist +2 for sit<>stand with cues for increased hip/trunk extension in standing. Pt with posterior lean initiatlly in standing, able to correct with cues and facilitation.  Ambulation/Gait Ambulation/Gait assistance: Mod assist Gait Distance (Feet): 80 Feet Assistive device: Rolling walker (2 wheeled) Gait Pattern/deviations: Decreased step length - left;Decreased step length -  right;Shuffle;Festinating;Trunk flexed Gait velocity: decreased Gait velocity interpretation: <1.31 ft/sec, indicative of household ambulator General Gait Details: shuffled gait, extremely decreased step length. Pt ambulated x 60 ft + 80 ft + 65 ft this session with use of RW and min-mod assist overall, pt requiring standing rest breaks between each bout of gait, increased assist needed during turns secondary to poor RW management and increased shuffling, cues throughout for upright posture and increased step length.   Stairs             Wheelchair Mobility    Modified Rankin (Stroke Patients Only)       Balance Overall balance assessment: Needs assistance Sitting-balance support: Bilateral upper extremity supported;Feet supported Sitting balance-Leahy Scale: Fair Sitting balance - Comments: pt able to perform anterior weightshift while seated in recliner and maintain sitting balance at the edge without back support CGA   Standing balance support: Bilateral upper extremity supported;During functional activity Standing balance-Leahy Scale: Poor Standing balance comment: Cues for forward weight shifting, pt with heavy reliance on UE support using RW, increased assist needed to maintain standing balance when taking single UE off RW. Delayed balance reactions overall                            Cognition Arousal/Alertness: Awake/alert Behavior During Therapy: WFL for tasks assessed/performed Overall Cognitive Status: Impaired/Different from baseline Area of Impairment: Orientation;Memory;Following commands;Safety/judgement;Problem solving                 Orientation Level: Disoriented to;Situation Current Attention Level: Focused Memory: Decreased short-term memory Following Commands: Follows one step commands with increased time Safety/Judgement: Decreased awareness of safety;Decreased  awareness of deficits Awareness: Intellectual Problem Solving: Slow  processing;Decreased initiation;Difficulty sequencing;Requires verbal cues;Requires tactile cues General Comments: Pt oriented to person and place however does not understand or know why he is in the hospital. Pt reports he wants to get up and walk to his car, axious to get out of chair this session.      Exercises      General Comments General comments (skin integrity, edema, etc.): Pts wife observed session, educated on pt's current mobility level and therapy reccomendations      Pertinent Vitals/Pain Faces Pain Scale: No hurt    Home Living                      Prior Function            PT Goals (current goals can now be found in the care plan section) Progress towards PT goals: Progressing toward goals    Frequency    Min 3X/week      PT Plan Discharge plan needs to be updated    Co-evaluation              AM-PAC PT "6 Clicks" Mobility   Outcome Measure  Help needed turning from your back to your side while in a flat bed without using bedrails?: A Lot Help needed moving from lying on your back to sitting on the side of a flat bed without using bedrails?: A Lot Help needed moving to and from a bed to a chair (including a wheelchair)?: A Lot Help needed standing up from a chair using your arms (e.g., wheelchair or bedside chair)?: A Lot Help needed to walk in hospital room?: A Lot Help needed climbing 3-5 steps with a railing? : Total 6 Click Score: 11    End of Session Equipment Utilized During Treatment: Gait belt Activity Tolerance: Patient tolerated treatment well Patient left: in chair;with chair alarm set;with call bell/phone within reach;with family/visitor present Nurse Communication: Mobility status PT Visit Diagnosis: Muscle weakness (generalized) (M62.81);Difficulty in walking, not elsewhere classified (R26.2)     Time: 6295-2841 PT Time Calculation (min) (ACUTE ONLY): 26 min  Charges:  $Gait Training: 23-37 mins(26)                      Netta Corrigan, PT, DPT Acute Rehab Office [place] 02/22/2019, 4:32 PM

## 2019-02-23 LAB — NOVEL CORONAVIRUS, NAA (HOSP ORDER, SEND-OUT TO REF LAB; TAT 18-24 HRS): SARS-CoV-2, NAA: NOT DETECTED

## 2019-02-23 MED ORDER — ACETAMINOPHEN 325 MG PO TABS: 650.0000 mg | ORAL_TABLET | Freq: Four times a day (QID) | ORAL | 0 refills | Status: DC | PRN

## 2019-02-23 MED ORDER — SODIUM CHLORIDE 0.9 % IV BOLUS
500.0000 mL | Freq: Once | INTRAVENOUS | Status: AC
  Administered 2019-02-23: 500 mL via INTRAVENOUS

## 2019-02-23 MED ORDER — CARBIDOPA-LEVODOPA 25-100 MG PO TABS: 0.5000 | ORAL_TABLET | Freq: Three times a day (TID) | ORAL | Status: DC

## 2019-02-23 MED ORDER — CEPHALEXIN 500 MG PO CAPS: 500.0000 mg | ORAL_CAPSULE | Freq: Three times a day (TID) | ORAL | 0 refills | Status: AC

## 2019-02-23 MED ORDER — AMLODIPINE BESYLATE 5 MG PO TABS: 5.0000 mg | ORAL_TABLET | Freq: Every day | ORAL | Status: DC

## 2019-02-23 NOTE — Plan of Care (Signed)
  Problem: Education: Goal: Knowledge of General Education information will improve Description: Including pain rating scale, medication(s)/side effects and non-pharmacologic comfort measures Outcome: Progressing   Problem: Health Behavior/Discharge Planning: Goal: Ability to manage health-related needs will improve Outcome: Progressing   Problem: Clinical Measurements: Goal: Ability to maintain clinical measurements within normal limits will improve Outcome: Progressing Goal: Will remain free from infection Outcome: Progressing Goal: Diagnostic test results will improve Outcome: Progressing   Problem: Elimination: Goal: Will not experience complications related to urinary retention Outcome: Progressing   Problem: Pain Managment: Goal: General experience of comfort will improve Outcome: Progressing   Problem: Safety: Goal: Ability to remain free from injury will improve Outcome: Progressing   Problem: Skin Integrity: Goal: Risk for impaired skin integrity will decrease Outcome: Progressing   

## 2019-02-23 NOTE — Progress Notes (Signed)
Physical Therapy Treatment Patient Details Name: [name] M Nikolai MRN: [id number] DOB: 12/22/1940 Today's Date: 02/23/2019    History of Present Illness Pt presents on 02/13/19 with decreased oral intake, new diagnosis of Parkinson's dementia & with PMH significant for HTN. Pt found to have ARF, mild rhabdomylosis and sespis.    PT Comments    Pt continues to progress with functional mobility and is able to perform ambulation with less assistance compared to previous sessions. Pt ambulated up to 90 ft with RW and min assist, standing rest breaks as needed secondary to fatigue. Pt requires increased cues for safety awareness and RW management during turns, limited secondary to cognitive deficits and weakness. Continue to recommend SNF level therapies in order to maximize functional independence.    Follow Up Recommendations  SNF     Equipment Recommendations  Other (comment)(tbd next venue)    Recommendations for Other Services       Precautions / Restrictions Precautions Precautions: Fall Restrictions Weight Bearing Restrictions: No    Mobility  Bed Mobility                  Transfers Overall transfer level: Needs assistance Equipment used: Rolling walker (2 wheeled)   Sit to Stand: Min assist         General transfer comment: Cues for hand placement and forward weight shifting to perform sit<>stand from the recliner.  min assist for x 3 sit<>stands throughout session with cues for increased hip/trunk extension in standing. Pt with posterior lean initiatlly in standing, able to correct with cues and facilitation.  Ambulation/Gait Ambulation/Gait assistance: Min assist Gait Distance (Feet): 90 Feet Assistive device: Rolling walker (2 wheeled) Gait Pattern/deviations: Decreased step length - left;Decreased step length - right;Shuffle;Festinating;Trunk flexed   Gait velocity interpretation: <1.31 ft/sec, indicative of household ambulator General Gait Details: Pt  ambulated x 90 ft + 90 ft with standing rest break between bouts, pt reuqiring increased cues for RW management during turns and cues for safety. Pt maintains flexed posture throughout gait despite cues and facilitation to correct. Gait distance limited by fatigue and decreased endurance   Stairs             Wheelchair Mobility    Modified Rankin (Stroke Patients Only)       Balance Overall balance assessment: Needs assistance Sitting-balance support: Feet supported Sitting balance-Leahy Scale: Good Sitting balance - Comments: sitting balance EOB with supervision without UE support   Standing balance support: Bilateral upper extremity supported;During functional activity Standing balance-Leahy Scale: Poor Standing balance comment: Cues for forward weight shifting, pt with heavy reliance on UE support using RW, increased assist needed to maintain standing balance when taking single UE off RW. Delayed balance reactions overall             High level balance activites: Backward walking High Level Balance Comments: Pt ambulated x 5 ft backwards with RW, increased assist needed to limit posterior LOB            Cognition Arousal/Alertness: Awake/alert Behavior During Therapy: WFL for tasks assessed/performed Overall Cognitive Status: Impaired/Different from baseline Area of Impairment: Orientation;Memory;Following commands;Safety/judgement;Problem solving                 Orientation Level: Disoriented to;Situation;Place Current Attention Level: Selective Memory: Decreased short-term memory Following Commands: Follows one step commands with increased time Safety/Judgement: Decreased awareness of safety;Decreased awareness of deficits Awareness: Intellectual Problem Solving: Slow processing;Requires verbal cues;Requires tactile cues General Comments: Pt disoriented to place and  reports "I am not in a hospital, why am I here?"      Exercises General Exercises -  Lower Extremity Hip Flexion/Marching: Strengthening;Both;10 reps;Standing(with RW)    General Comments        Pertinent Vitals/Pain Pain Assessment: No/denies pain Faces Pain Scale: No hurt    Home Living                      Prior Function            PT Goals (current goals can now be found in the care plan section) Progress towards PT goals: Progressing toward goals    Frequency    Min 2X/week      PT Plan Discharge plan needs to be updated;Frequency needs to be updated    Co-evaluation              AM-PAC PT "6 Clicks" Mobility   Outcome Measure  Help needed turning from your back to your side while in a flat bed without using bedrails?: A Lot Help needed moving from lying on your back to sitting on the side of a flat bed without using bedrails?: A Lot Help needed moving to and from a bed to a chair (including a wheelchair)?: A Little Help needed standing up from a chair using your arms (e.g., wheelchair or bedside chair)?: A Little Help needed to walk in hospital room?: A Little Help needed climbing 3-5 steps with a railing? : A Lot 6 Click Score: 15    End of Session Equipment Utilized During Treatment: Gait belt Activity Tolerance: Patient tolerated treatment well Patient left: in chair;with chair alarm set;with call bell/phone within reach;with family/visitor present Nurse Communication: Mobility status PT Visit Diagnosis: Muscle weakness (generalized) (M62.81);Difficulty in walking, not elsewhere classified (R26.2)     Time: 3474-2595 PT Time Calculation (min) (ACUTE ONLY): 20 min  Charges:  $Gait Training: 8-22 mins                     Netta Corrigan, PT, DPT Acute Rehab Office [place] 02/23/2019, 3:57 PM

## 2019-02-23 NOTE — Discharge Summary (Addendum)
Physician Discharge Summary  [name] ZOX:[id number] DOB: 05/31/1941 DOA: 02/13/2019  PCP: Tresa GarterPlotnikov, Aleksei V, MD  Admit date: 02/13/2019 Discharge date: 02/24/2019  Recommendations for Outpatient Follow-up:  1. Follow up with PCP in 7-10 days after discharge from SNF. 2. Follow up with neurology in 2 weeks after discharge from SNF.  Contact information for after-discharge care    Destination    [facility] .   Service: Skilled Nursing Contact information: [address] [place] [id number] [phone]               Discharge Diagnoses: Principal diagnosis is #1 1. Acute renal failure 2. Delirium 3. Mild rhabdomyolysis 4. Sepsis 5. Gram negative bacteremia: Klebsiella pneumonia 6. Severe protein calorie malnutrition 7. Parkinson's disease 8. 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 Thrombocytopenia  Discharge Condition: Fair Disposition: SNF  Diet recommendation: Heart Healthy  Filed Weights   02/20/19 0500 02/21/19 0500 02/22/19 0946  Weight: 83 kg 83 kg 86.8 kg    History of present illness:  [name]  is a 78 y.o. male,   w hypertension, hyperlipidemia, w new diagnosis of parkinsons dementia apparently presents with c/o decrease oral intake since Wednesday.  Pt started to have fever yesterday am per his wife.  Wife is providing the history .  Pt is only axox1 (person), not place or time.  Pt making urine.  No complaints of dysuria. Might have had increase in urinary frequency.  Per wife, no fever, chills, no cp, no palp, no sob, no n/v, no abd pain, no diarrhea, no brbpr, no hematuria, no flank pain.  Pt brought to ED due to fever.   Hospital Course:  [facility] were consulted to admit the patient for further evaluation and treatment. This was likely due to sepsis of unknown source. Blood cultures were obtained which have since grown out due klebsiella pneumonia. He has been started on IV ceftriaxone.  Surveillance cultures have been collected.  The patient has been evaluated by PT/OT. They have recommended SNF. The wife initially wanted to take him home, but she realizes that he will be difficult to manage at home as weak as he is. He is awaiting SNF placement.  02/21/2019: Patient seen.  Awaiting skilled nursing facility placement.  No new changes.  Renal function is back to baseline.  Complete course of antibiotics. 02/22/2019: Patient seen alongside patient's wife.  AKI is back to baseline.  Patient is awaiting discharge to rehab 02/23/2019: Patient is sitting up at bedside. He is awake, alert, but a little confused. His wife is visiting. No new complaints. He will discharge to SNF today.  Today's assessment: S: The patient is sitting up in a chair at bedside. No new complaints. O: Vitals:  Vitals:   02/23/19 0414 02/23/19 1252  BP: (!) 173/79 (!) 77/48  Pulse: 65 67  Resp: 18 17  Temp: 98.5 F (36.9 C) 98.6 F (37 C)  SpO2: 97% 96%    Constitutional:  The patient is awake and alert. According to his wife he is in and out of confusion today. No acute distress. Respiratory:   There is no increased work of breathing.  No wheezes, rales, or rhonchi  No tactile fremitus. Cardiovascular:   Regular rate and rhythm.  No murmurs, ectopy or gallups.  No lateral PMI. No thrills. Abdomen:   Abdomen is soft, non-tender, non-distended  No hernias, masses, or hernias are appreciated.  Normoactive bowel sounds.  Musculoskeletal:   No cyanosis, clubbing,  or edema. Skin:   No rashes or lesions  Positve for pressure ulcer on sacrum  palpation of skin: no induration or nodules Neurologic:   CN 2-12 intact  Sensation all 4 extremities intact Psychiatric:   Unable to evaluate.  Discharge Instructions  Discharge Instructions    Activity as tolerated - No restrictions   Complete by: As directed    Call MD for:  difficulty breathing, headache or visual disturbances    Complete by: As directed    Call MD for:  temperature >100.4   Complete by: As directed    Diet - low sodium heart healthy   Complete by: As directed    Discharge instructions   Complete by: As directed    Follow up with PCP in 7-10 days after discharge from SNF. Follow up with neurology within 2 weeks of discharge from SNF.   Increase activity slowly   Complete by: As directed      Allergies as of 02/23/2019      Reactions   Amlodipine    Edema w/10 mg   Atorvastatin    REACTION: leg cramps   Penicillins Hives   Has patient had a PCN reaction causing immediate rash, facial/tongue/throat swelling, SOB or lightheadedness with hypotension: Yes Has patient had a PCN reaction causing severe rash involving mucus membranes or skin necrosis: No Has patient had a PCN reaction that required hospitalization No Has patient had a PCN reaction occurring within the last 10 years: No If all of the above answers are "NO", then may proceed with Cephalosporin use.   Sulfonamide Derivatives Hives   Telmisartan    ?fatigue   Sulfa Antibiotics Other (See Comments), Palpitations   Sweating, passed out      Medication List    STOP taking these medications   amLODipine-benazepril 5-20 MG capsule Commonly known as: Lotrel   furosemide 20 MG tablet Commonly known as: LASIX     TAKE these medications   acetaminophen 325 MG tablet Commonly known as: TYLENOL Take 2 tablets (650 mg total) by mouth every 6 (six) hours as needed for mild pain (or Fever >/= 101).   amLODipine 5 MG tablet Commonly known as: NORVASC Take 1 tablet (5 mg total) by mouth daily.   carbidopa-levodopa 25-100 MG tablet Commonly known as: SINEMET IR Take 0.5 tablets by mouth 3 (three) times daily.   cephALEXin 500 MG capsule Commonly known as: KEFLEX Take 1 capsule (500 mg total) by mouth every 8 (eight) hours for 4 days.   cyanocobalamin 1000 MCG/ML injection Commonly known as: (VITAMIN B-12) 1 ml sq for 5 days,  then 1 ml weekly for 4 weeks, then 1 ml every 2 weeks   potassium chloride 8 MEQ tablet Commonly known as: KLOR-CON Take 1 tablet (8 mEq total) by mouth daily.   SYRINGE-NEEDLE (DISP) 3 ML 25G X 1" 3 ML Misc Commonly known as: BD Eclipse Syringe Use sq as directed   traZODone 50 MG tablet Commonly known as: DESYREL TAKE 2 TABLETS BY MOUTH EVERY DAY AT BEDTIME What changed:   how much to take  how to take this  when to take this  additional instructions   Vitamin D 1000 units capsule Take 1 capsule (1,000 Units total) by mouth daily.            Durable Medical Equipment  (From admission, onward)         Start     Ordered   02/17/19 0825  For home use only  DME lightweight manual wheelchair with seat cushion  Once    Comments: Patient suffers from Acute renal failure which impairs their ability to perform daily activities like ambulating  in the home.  A cane will not resolve  issue with performing activities of daily living. A wheelchair will allow patient to safely perform daily activities. Patient is not able to propel themselves in the home using a standard weight wheelchair due to weakness. Patient can self propel in the lightweight wheelchair. Length of need one year  Accessories: elevating leg rests (ELRs), wheel locks, extensions and anti-tippers.  Seat and back cushions  Call patient's wife for delivery Halley Kincer [phone]   02/17/19 0826   02/17/19 0822  For home use only DME Hospital bed  Once    Comments: Call patient's wife for delivery Joanna Hall [phone]  Question Answer Comment  Length of Need 12 Months   Patient has (list medical condition): Acute renal failure   The above medical condition requires: Patient requires the ability to reposition frequently   Head must be elevated greater than: 45 degrees   Bed type Semi-electric   Hoyer Lift Yes   Support Surface: Gel Overlay      02/17/19 0822   02/17/19 0821  For home use only DME  Walker rolling  Once    Question:  Patient needs a walker to treat with the following condition  Answer:  Acute renal failure (ARF) ([place])   02/17/19 6789   02/17/19 0821  For home use only DME 3 n 1  Once     02/17/19 0822          Allergies  Allergen Reactions   Amlodipine     Edema w/10 mg   Atorvastatin     REACTION: leg cramps   Penicillins Hives    Has patient had a PCN reaction causing immediate rash, facial/tongue/throat swelling, SOB or lightheadedness with hypotension: Yes Has patient had a PCN reaction causing severe rash involving mucus membranes or skin necrosis: No Has patient had a PCN reaction that required hospitalization No Has patient had a PCN reaction occurring within the last 10 years: No If all of the above answers are "NO", then may proceed with Cephalosporin use.    Sulfonamide Derivatives Hives   Telmisartan     ?fatigue   Sulfa Antibiotics Other (See Comments) and Palpitations    Sweating, passed out    The results of significant diagnostics from this hospitalization (including imaging, microbiology, ancillary and laboratory) are listed below for reference.    Significant Diagnostic Studies: Ct Head Wo Contrast  Result Date: 02/16/2019 CLINICAL DATA:  Parkinson's disease.  Dementia. EXAM: CT HEAD WITHOUT CONTRAST TECHNIQUE: Contiguous axial images were obtained from the base of the skull through the vertex without intravenous contrast. COMPARISON:  MRI 11/23/2018 FINDINGS: Brain: Generalized atrophy. Chronic small-vessel ischemic changes of the cerebral hemispheric white matter. No sign of acute infarction, mass lesion, hemorrhage, hydrocephalus or extra-axial collection. Vascular: There is atherosclerotic calcification of the major vessels at the base of the brain. Skull: Negative Sinuses/Orbits: Clear/normal Other: None IMPRESSION: No acute finding. Atrophy and chronic small-vessel change of the hemispheric white matter. Electronically Signed   By:  Nelson Chimes M.D.   On: 02/16/2019 20:59   US Renal  Result Date: 02/13/2019 CLINICAL DATA:  Acute renal failure. EXAM: RENAL / URINARY TRACT ULTRASOUND COMPLETE COMPARISON:  None. FINDINGS: Right Kidney: Renal measurements: 12.7 x 4.7 x 3.9 cm = volume: 123  mL . Echogenicity borderline increased. No mass or hydronephrosis visualized. Left Kidney: Renal measurements: 12.6 x 6.1 x 4.7 cm = volume: 188 mL. Echogenicity borderline increased. No mass or hydronephrosis visualized. Bladder: Appears normal for degree of bladder distention. IMPRESSION: No evidence for hydronephrosis. Electronically Signed   By: Kennith Center M.D.   On: 02/13/2019 17:56   Dg Chest Port 1 View  Result Date: 02/13/2019 CLINICAL DATA:  Sepsis EXAM: PORTABLE CHEST 1 VIEW COMPARISON:  07/13/2016 FINDINGS: The heart size and mediastinal contours are within normal limits. Unchanged elevation of the left hemidiaphragm with associated atelectasis or consolidation. The visualized skeletal structures are unremarkable. IMPRESSION: Unchanged elevation of the left hemidiaphragm with associated atelectasis or consolidation. No new or acute appearing airspace opacity. Electronically Signed   By: Lauralyn Primes M.D.   On: 02/13/2019 15:28    Microbiology: Recent Results (from the past 240 hour(s))  Novel Coronavirus, NAA (hospital order; send-out to ref lab)     Status: None   Collection Time: 02/13/19  2:14 PM   Specimen: Nasopharyngeal Swab; Respiratory  Result Value Ref Range Status   SARS-CoV-2, NAA NOT DETECTED NOT DETECTED Final    Comment: (NOTE) This test was developed and its performance characteristics determined by [employer] [facility]. This test has not been FDA cleared or approved. This test has been authorized by FDA under an Emergency Use Authorization (EUA). This test is only authorized for the duration of time the declaration that circumstances exist justifying the authorization of the emergency use of in  vitro diagnostic tests for detection of SARS-CoV-2 virus and/or diagnosis of COVID-19 infection under section 564(b)(1) of the Act, 21 U.S.C. 540JWJ-1(B)(1), unless the authorization is terminated or revoked sooner. When diagnostic testing is negative, the possibility of a false negative result should be considered in the context of a patient's recent exposures and the presence of clinical signs and symptoms consistent with COVID-19. An individual without symptoms of COVID-19 and who is not shedding SARS-CoV-2 virus would expect to have a negative (not detected) result in this assay. Performed  At: [facility] [address] [place], [place] [id number] Jolene Schimke MD HY:[id number]    Coronavirus Source NASOPHARYNGEAL  Final    Comment: Performed at [facility], [address] [place], [place] [address]  Blood Culture (routine x 2)     Status: Abnormal   Collection Time: 02/13/19  2:15 PM   Specimen: BLOOD  Result Value Ref Range Status   Specimen Description BLOOD RIGHT ANTECUBITAL  Final   Special Requests   Final    BOTTLES DRAWN AEROBIC AND ANAEROBIC Blood Culture results may not be optimal due to an inadequate volume of blood received in culture bottles   Culture  Setup Time   Final    GRAM NEGATIVE RODS IN BOTH AEROBIC AND ANAEROBIC BOTTLES CRITICAL RESULT CALLED TO, READ BACK BY AND VERIFIED WITH: PHARMD M MACCIA [id number] AT 1421 BY CM    Culture (A)  Final    KLEBSIELLA PNEUMONIAE SUSCEPTIBILITIES PERFORMED ON PREVIOUS CULTURE WITHIN THE LAST 5 DAYS. Performed at [facility], [address] [place], [place] [address]    Report Status 02/16/2019 FINAL  Final  Blood Culture (routine x 2)     Status: Abnormal   Collection Time: 02/13/19  2:45 PM   Specimen: BLOOD  Result Value Ref Range Status   Specimen Description BLOOD LEFT ANTECUBITAL  Final   Special Requests   Final    BOTTLES DRAWN AEROBIC  ONLY Blood Culture results may not be optimal due  to an inadequate volume of blood received in culture bottles   Culture  Setup Time (A)  Final    GRAM VARIABLE ROD AEROBIC BOTTLE ONLY CRITICAL RESULT CALLED TO, READ BACK BY AND VERIFIED WITH: PHARMD J CARNEY [id number] AT 1013 BY CM Performed at [facility], [address] [place], [place] [address]    Culture KLEBSIELLA PNEUMONIAE (A)  Final   Report Status 02/16/2019 FINAL  Final   Organism ID, Bacteria KLEBSIELLA PNEUMONIAE  Final      Susceptibility   Klebsiella pneumoniae - MIC*    AMPICILLIN >=32 RESISTANT Resistant     CEFAZOLIN <=4 SENSITIVE Sensitive     CEFEPIME <=1 SENSITIVE Sensitive     CEFTAZIDIME <=1 SENSITIVE Sensitive     CEFTRIAXONE <=1 SENSITIVE Sensitive     CIPROFLOXACIN <=0.25 SENSITIVE Sensitive     GENTAMICIN <=1 SENSITIVE Sensitive     IMIPENEM <=0.25 SENSITIVE Sensitive     TRIMETH/SULFA <=20 SENSITIVE Sensitive     AMPICILLIN/SULBACTAM 4 SENSITIVE Sensitive     PIP/TAZO <=4 SENSITIVE Sensitive     Extended ESBL NEGATIVE Sensitive     * KLEBSIELLA PNEUMONIAE  Blood Culture ID Panel (Reflexed)     Status: Abnormal   Collection Time: 02/13/19  2:45 PM  Result Value Ref Range Status   Enterococcus species NOT DETECTED NOT DETECTED Final   Listeria monocytogenes NOT DETECTED NOT DETECTED Final   Staphylococcus species NOT DETECTED NOT DETECTED Final   Staphylococcus aureus (BCID) NOT DETECTED NOT DETECTED Final   Streptococcus species NOT DETECTED NOT DETECTED Final   Streptococcus agalactiae NOT DETECTED NOT DETECTED Final   Streptococcus pneumoniae NOT DETECTED NOT DETECTED Final   Streptococcus pyogenes NOT DETECTED NOT DETECTED Final   Acinetobacter baumannii NOT DETECTED NOT DETECTED Final   Enterobacteriaceae species DETECTED (A) NOT DETECTED Final    Comment: Enterobacteriaceae represent a large family of gram-negative bacteria, not a single organism. CRITICAL RESULT CALLED TO, READ BACK BY AND VERIFIED WITH: PHARMD J CARNEY [id number] AT 1013 BY  CM    Enterobacter cloacae complex NOT DETECTED NOT DETECTED Final   Escherichia coli NOT DETECTED NOT DETECTED Final   Klebsiella oxytoca NOT DETECTED NOT DETECTED Final   Klebsiella pneumoniae DETECTED (A) NOT DETECTED Final    Comment: CRITICAL RESULT CALLED TO, READ BACK BY AND VERIFIED WITH: PHARMD J CARNEY [id number] AT 1013 BY CM    Proteus species NOT DETECTED NOT DETECTED Final   Serratia marcescens NOT DETECTED NOT DETECTED Final   Carbapenem resistance NOT DETECTED NOT DETECTED Final   Haemophilus influenzae NOT DETECTED NOT DETECTED Final   Neisseria meningitidis NOT DETECTED NOT DETECTED Final   Pseudomonas aeruginosa NOT DETECTED NOT DETECTED Final   Candida albicans NOT DETECTED NOT DETECTED Final   Candida glabrata NOT DETECTED NOT DETECTED Final   Candida krusei NOT DETECTED NOT DETECTED Final   Candida parapsilosis NOT DETECTED NOT DETECTED Final   Candida tropicalis NOT DETECTED NOT DETECTED Final    Comment: Performed at [facility], [address], [place], [place] [address]  Culture, blood (routine x 2)     Status: None   Collection Time: 02/16/19 12:02 PM   Specimen: BLOOD RIGHT HAND  Result Value Ref Range Status   Specimen Description BLOOD RIGHT HAND  Final   Special Requests   Final    BOTTLES DRAWN AEROBIC AND ANAEROBIC Blood Culture results may not be optimal  due to an inadequate volume of blood received in culture bottles   Culture   Final    NO GROWTH 5 DAYS Performed at [facility], [address] [place], [place] [address]    Report Status 02/21/2019 FINAL  Final  Culture, blood (routine x 2)     Status: None   Collection Time: 02/16/19 12:16 PM   Specimen: BLOOD RIGHT HAND  Result Value Ref Range Status   Specimen Description BLOOD RIGHT HAND  Final   Special Requests   Final    BOTTLES DRAWN AEROBIC AND ANAEROBIC Blood Culture adequate volume   Culture   Final    NO GROWTH 5 DAYS Performed at [facility], [address] [place], [place] [address]    Report Status 02/21/2019 FINAL  Final  Culture, Urine     Status: Abnormal   Collection Time: 02/17/19 11:32 AM   Specimen: Urine, Random  Result Value Ref Range Status   Specimen Description URINE, RANDOM  Final   Special Requests NONE  Final   Culture (A)  Final    <10,000 COLONIES/mL INSIGNIFICANT GROWTH Performed at [facility], [address] [place], [place] [address]    Report Status 02/18/2019 FINAL  Final  Novel Coronavirus, NAA (hospital order; send-out to ref lab)     Status: None   Collection Time: 02/19/19  2:10 PM   Specimen: Nasopharyngeal Swab; Respiratory  Result Value Ref Range Status   SARS-CoV-2, NAA NOT DETECTED NOT DETECTED Final    Comment: (NOTE) This test was developed and its performance characteristics determined by [facility]. This test has not been FDA cleared or approved. This test has been authorized by FDA under an Emergency Use Authorization (EUA). This test is only authorized for the duration of time the declaration that circumstances exist justifying the authorization of the emergency use of in vitro diagnostic tests for detection of SARS-CoV-2 virus and/or diagnosis of COVID-19 infection under section 564(b)(1) of the Act, 21 U.S.C. 914NWG-9(F)(6360bbb-3(b)(1), unless the authorization is terminated or revoked sooner. When diagnostic testing is negative, the possibility of a false negative result should be considered in the context of a patient's recent exposures and the presence of clinical signs and symptoms consistent with COVID-19. An individual without symptoms of COVID-19 and who is not shedding SARS-CoV-2 virus would expect to have a negative (not detected) result in this assay. Performed  At: [facility] [address] [place], [place] [id number] Jolene SchimkeNagendra Sanjai MD IO:[id number]:[phone]    Coronavirus Source NASOPHARYNGEAL  Final    Comment: Performed at [facility], [address]  [place], [place] [address]  Novel Coronavirus, NAA (hospital order; send-out to ref lab)     Status: None   Collection Time: 02/22/19  8:53 AM   Specimen: Nasopharyngeal Swab; Respiratory  Result Value Ref Range Status   SARS-CoV-2, NAA NOT DETECTED NOT DETECTED Final    Comment: (NOTE) This test was developed and its performance characteristics determined by [facility]. This test has not been FDA cleared or approved. This test has been authorized by FDA under an Emergency Use Authorization (EUA). This test is only authorized for the duration of time the declaration that circumstances exist justifying the authorization of the emergency use of in vitro diagnostic tests for detection of SARS-CoV-2 virus and/or diagnosis of COVID-19 infection under section 564(b)(1) of the Act, 21 U.S.C. 027OZD-6(U)(4360bbb-3(b)(1), unless the authorization is terminated or revoked sooner. When diagnostic testing is negative,  the possibility of a false negative result should be considered in the context of a patient's recent exposures and the presence of clinical signs and symptoms consistent with COVID-19. An individual without symptoms of COVID-19 and who is not shedding SARS-CoV-2 virus would expect to have a negative (not detected) result in this assay. Performed  At: [facility] [address] [place], [place] [id number] Jolene Schimke MD WU:[id number]    Coronavirus Source NASOPHARYNGEAL  Final    Comment: Performed at [facility], [address], [place], [place] [address]     Labs: Basic Metabolic Panel: Recent Labs  Lab 02/17/19 0236 02/18/19 0312 02/20/19 0158 02/21/19 0309  NA 144 145 139 140  K 3.5 3.5 3.2* 3.5  CL 112* 111 106 104  CO2 22 25 25 25   GLUCOSE 126* 117* 112* 109*  BUN 44* 36* 30* 22  CREATININE 1.91* 1.69* 1.39* 1.29*  CALCIUM 8.4* 8.4* 8.1* 8.2*   Liver Function Tests: Recent Labs  Lab 02/17/19 0236 02/18/19 0312 02/20/19 0158 02/21/19 0309  AST  10* 10* 13* 12*  ALT 5 7 6 5   ALKPHOS 47 50 54 50  BILITOT 0.9 0.5 0.8 0.6  PROT 5.0* 4.7* 4.7* 4.5*  ALBUMIN 1.9* 1.8* 1.9* 1.8*   No results for input(s): LIPASE, AMYLASE in the last 168 hours. No results for input(s): AMMONIA in the last 168 hours. CBC: Recent Labs  Lab 02/18/19 0312 02/19/19 0545 02/20/19 0158  WBC 6.3 6.1 6.5  NEUTROABS 4.5 4.3 4.5  HGB 12.5* 12.8* 12.0*  HCT 36.6* 37.5* 36.1*  MCV 86.3 86.6 88.0  PLT 149* 168 214   Cardiac Enzymes: No results for input(s): CKTOTAL, CKMB, CKMBINDEX, TROPONINI in the last 168 hours. BNP: BNP (last 3 results) No results for input(s): BNP in the last 8760 hours.  ProBNP (last 3 results) No results for input(s): PROBNP in the last 8760 hours.  CBG: No results for input(s): GLUCAP in the last 168 hours.  Principal Problem:   ARF (acute renal failure) (HCC) Active Problems:   Essential hypertension   Dyslipidemia   Sepsis (HCC)   Protein-calorie malnutrition, severe (HCC)   Dementia due to Parkinson's disease without behavioral disturbance (HCC)   Parkinson's disease (HCC)   Pressure injury of skin   Time coordinating discharge: 38 minutes.  Signed:        Daxten Kovalenko, DO [facility]  02/24/2019 10:05 AM

## 2019-02-23 NOTE — Plan of Care (Signed)

## 2019-02-23 NOTE — Progress Notes (Signed)
PT Cancellation Note  Patient Details Name: [name] MRN: [id number] DOB: [birth year]   Cancelled Treatment:    Reason Eval/Treat Not Completed: Medical issues which prohibited therapy (pt with hypotension limiting participation)   Will follow up for PT treatment as schedule permits.    Netta Corrigan, PT, DPT 02/23/2019, 1:40 PM

## 2019-02-23 NOTE — Plan of Care (Signed)
  Problem: Health Behavior/Discharge Planning: Goal: Ability to manage health-related needs will improve Outcome: Progressing   

## 2019-02-23 NOTE — Progress Notes (Addendum)
PROGRESS NOTE  [name] ZSW:[id number] DOB: Mar 01, 1941 DOA: 02/13/2019 PCP: Cassandria Anger, MD  Brief History   [name]  is a 78 y.o. male,   w hypertension, hyperlipidemia, w new diagnosis of parkinsons dementia apparently presents with c/o decrease oral intake since Wednesday.  Pt started to have fever yesterday am per his wife.  Wife is providing the history .  Pt is only axox1 (person), not place or time.  Pt making urine.  No complaints of dysuria. Might have had increase in urinary frequency.  Per wife, no fever, chills, no cp, no palp, no sob, no n/v, no abd pain, no diarrhea, no brbpr, no hematuria, no flank pain.  Pt brought to ED due to fever.   In the ED he was found to have acute renal failure with mild rhabdomyolysis.   [facility] were consulted to admit the patient for further evaluation and treatment. This was likely due to sepsis of unknown source. Blood cultures were obtained which have since grown out due klebsiella pneumonia. He has been started on IV ceftriaxone. Surveillance cultures have been collected.  The patient has been evaluated by PT/OT. They have recommended SNF. The wife initially wanted to take him home, but she realizes that he will be difficult to manage at home as weak as he is. The patient was to be discharged to SNF today. However, he had an episode of hypotension this afternoon, that was treated successfully with IV fluid bolus. He will be discharged to SNF on 02/24/2019.  Consultants   None  Procedures   None  Antibiotics   Anti-infectives (From admission, onward)   Start     Dose/Rate Route Frequency Ordered Stop   02/23/19 0000  cephALEXin (KEFLEX) 500 MG capsule     500 mg Oral Every 8 hours 02/23/19 1301 02/27/19 2359   02/18/19 2200  cephALEXin (KEFLEX) capsule 500 mg  Status:  Discontinued     500 mg Oral Every 12 hours 02/18/19 1335 02/18/19 1339   02/18/19 2000  ciprofloxacin (CIPRO) tablet 500 mg  Status:  Discontinued      500 mg Oral 2 times daily 02/18/19 1258 02/18/19 1334   02/18/19 1400  cephALEXin (KEFLEX) capsule 500 mg     500 mg Oral Every 8 hours 02/18/19 1339     02/18/19 1200  ceFAZolin (ANCEF) IVPB 2g/100 mL premix  Status:  Discontinued     2 g 200 mL/hr over 30 Minutes Intravenous Every 8 hours 02/18/19 0927 02/18/19 1259   02/16/19 0930  ceFAZolin (ANCEF) IVPB 1 g/50 mL premix  Status:  Discontinued     1 g 100 mL/hr over 30 Minutes Intravenous Every 8 hours 02/16/19 0921 02/18/19 0927   02/14/19 1500  ceFEPIme (MAXIPIME) 2 g in sodium chloride 0.9 % 100 mL IVPB  Status:  Discontinued     2 g 200 mL/hr over 30 Minutes Intravenous Every 24 hours 02/13/19 1512 02/14/19 1028   02/14/19 1200  cefTRIAXone (ROCEPHIN) 2 g in sodium chloride 0.9 % 100 mL IVPB  Status:  Discontinued     2 g 200 mL/hr over 30 Minutes Intravenous Every 24 hours 02/14/19 1028 02/16/19 0932   02/13/19 1511  vancomycin variable dose per unstable renal function (pharmacist dosing)  Status:  Discontinued      Does not apply See admin instructions 02/13/19 1512 02/14/19 1028   02/13/19 1415  ceFEPIme (MAXIPIME) 2 g in sodium chloride 0.9 % 100 mL IVPB  2 g 200 mL/hr over 30 Minutes Intravenous  Once 02/13/19 1404 02/13/19 1633   02/13/19 1415  metroNIDAZOLE (FLAGYL) IVPB 500 mg     500 mg 100 mL/hr over 60 Minutes Intravenous  Once 02/13/19 1404 02/13/19 1633   02/13/19 1415  vancomycin (VANCOCIN) IVPB 1000 mg/200 mL premix  Status:  Discontinued     1,000 mg 200 mL/hr over 60 Minutes Intravenous  Once 02/13/19 1404 02/13/19 1407   02/13/19 1415  vancomycin (VANCOCIN) 1,750 mg in sodium chloride 0.9 % 500 mL IVPB  Status:  Discontinued     1,750 mg 250 mL/hr over 120 Minutes Intravenous  Once 02/13/19 1407 02/14/19 1028        Subjective  The patient is sitting up in a chair at bedside today. He is much less confused than yesterday.  Objective   Vitals:  Vitals:   02/23/19 1602 02/23/19 2002  BP: 119/68  126/60  Pulse: 68 70  Resp: 18 17  Temp: 99.8 F (37.7 C) 98.6 F (37 C)  SpO2: 97% 95%    Exam:  Constitutional:  The patient is awake and alert. According to his wife he is in and out of confusion today. No acute distress. Respiratory:   There is no increased work of breathing.  No wheezes, rales, or rhonchi  No tactile fremitus. Cardiovascular:   Regular rate and rhythm.  No murmurs, ectopy or gallups.  No lateral PMI. No thrills. Abdomen:   Abdomen is soft, non-tender, non-distended  No hernias, masses, or hernias are appreciated.  Normoactive bowel sounds.  Musculoskeletal:   No cyanosis, clubbing, or edema. Skin:   No rashes or lesions  Positve for pressure ulcer on sacrum  palpation of skin: no induration or nodules Neurologic:   CN 2-12 intact  Sensation all 4 extremities intact Psychiatric:   Unable to evaluate.  I have personally reviewed the following:   Today's Data   Vitals, CMP, CBC  Micro Data   Blood cultures.  Scheduled Meds:  amLODipine  5 mg Oral Daily   carbidopa-levodopa  0.5 tablet Oral TID   cephALEXin  500 mg Oral Q8H   heparin  5,000 Units Subcutaneous Q8H     Principal Problem:   ARF (acute renal failure) (HCC) Active Problems:   Essential hypertension   Dyslipidemia   Sepsis (HCC)   Protein-calorie malnutrition, severe (HCC)   Dementia due to Parkinson's disease without behavioral disturbance (HCC)   Parkinson's disease (HCC)   Pressure injury of skin   LOS: 10 days   A & P   Acute renal failure: Creatinine is down from 4.65 to 1.39. Likely of mixed etiology -pre-renal and rhabdomyolysis.. Renal ultrasound was negative for hydronephrosis. CK trending downward. Continue cautious IV fluids. Avoid nephrotoxic substances and hypotension. SPEP, immunofixation, are pending. Lotrel and lasix are being held. The patient has a foley catheter for accurate urine collection and quantification.  Delirium.  Mobilize. DC sedating meds.   Mild rhabdomyolysis: Due to sepsis. Hydrate, monitor, CK is trending downward. Monitor.  Sepsis: Based upon fever, hypotension, lactic acid of 2.0, leukocytosis, and tachycardia on admission. Now resolved.  Gram negative bacteremia: 1/2 blood cultures drawn on admission has grown out klebsiella pneumonia. The patient has been started on ceftriaxone pending sensitivities. Surveillance cultures have been collected. They have had no growth left. The patient has been converted to oral antibiotics.  Severe protein calorie malnutrition: Consult nutrition. Protostat 30 mL's p.o. twice daily.  Parkinson's disease: Chronic. Speech therapy to evaluate  swallowing. I spoke with neurology , they recommended starting low dose trial of sinemet and outpatient follow up.  Dementia secondary to Parkinson's disease:  MRI Brain 11/23/2018 IMPRESSION: No acute or reversible finding. Atrophy of the cerebral hemispheres with temporal lobe predominance. Mild moderate chronic small-vessel change of the white matter. DC Trazodone due to somnolence  Thrombocytopenia (mild): Check LDH if high then consider calling oncology r/o TTP. Follow platelets. Probably due to sepsis.  I have seen and examined this patient myself. I have spent 32 minutes in his evaluation and care.  DVT Prophylaxis:    Heparin and SCDs  Family Communication: Patient wife at bedside. Code Status:  FULL CODE, patient is w [name] Disposition: The patient was to be discharged to SNF today. However, he had an episode of hypotension this afternoon, that was treated successfully with IV fluid bolus. He will be discharged to SNF on 02/24/2019.  Kartier Bennison, DO [facility] Direct contact: see [email or web address]  7PM-7AM contact night coverage as above 02/23/2019, 8:10 PM  LOS: 1 day

## 2019-02-23 NOTE — Progress Notes (Signed)
  Speech Language Pathology Treatment: Dysphagia  Patient Details Name: [name] MRN: [id number] DOB: June 15, 1941 Today's Date: 02/23/2019 Time: 1062-6948 SLP Time Calculation (min) (ACUTE ONLY): 17 min  Assessment / Plan / Recommendation Clinical Impression  Pt attempting to get out of bed when SLP arrived. Decreased po consumed of breakfast tray but agreeable to eggs and trial of upgraded Dys 3 (mech soft texture). He exhibited slightly delayed cough with thin trial following cracker. No s/s observed with other water intake via cup or spoon. He required cues to identify min-mild anterior oral cavity residue and SLP assisted to remove with toothbrush. Mastication pattern functional and SLP educated pt to check for any potential residue with lingual sweep. Will upgrade texture to Dys 3, thin with continued intervention reiterating strategies for safety and oral clearance.    HPI HPI: 78 y.o. male with PMHx of hypertension, hyperlipidemia, new diagnosis of parkinsons dementia presented 8/22 with decreased oral intake and fever. Dx ARF, mild rhabdomyolysis, sepsis.       SLP Plan  Continue with current plan of care       Recommendations  Diet recommendations: Dysphagia 3 (mechanical soft);Thin liquid Liquids provided via: Cup;Straw Medication Administration: Whole meds with puree Supervision: Staff to assist with self feeding;Full supervision/cueing for compensatory strategies Compensations: Slow rate;Small sips/bites;Minimize environmental distractions;Lingual sweep for clearance of pocketing Postural Changes and/or Swallow Maneuvers: Seated upright 90 degrees                Oral Care Recommendations: Oral care BID Follow up Recommendations: Other (comment)(tba) SLP Visit Diagnosis: Dysphagia, unspecified (R13.10) Plan: Continue with current plan of care       GO                Houston Siren 02/23/2019, 9:46 AM   Orbie Pyo Colvin Caroli.Ed  Risk analyst [phone] Office [phone]

## 2019-02-23 NOTE — Progress Notes (Signed)
OT Cancellation Note  Patient Details Name: [name] MRN: [id number] DOB: April 14, 1941   Cancelled Treatment:    Reason Eval/Treat Not Completed: Medical issues which prohibited therapy hypotension limiting participation in therapy at this time. Will return at later time for OT treatment as time allows and pt is appropriate.   Dorinda Hill OTR/L Acute Rehabilitation Services Office: [place] 02/23/2019, 1:46 PM

## 2019-02-23 NOTE — TOC Progression Note (Addendum)
Transition of Care [facility]) - Progression Note    Patient Details  Name: HAWARD POPE MRN: [id number] Date of Birth: [birth year]  [facility] of [facility]) CM/SW Contact  Gracielynn Birkel, Edson Snowball, RN Phone Number: 02/23/2019, 12:53 PM  Clinical Narrative:    [id number] Received a call from Linus Orn at [facility], Mrs Lamere told Linus Orn patient is not being discharge until tomorrow due to his blood pressure . Wife has not done paperwork, patient cannot be discharged until paperwork is completed. Spoke with Mrs Maye she is requesting MD to call her. MD given message. Tracey at [facility] can take patient tomorrow after paperwork completed.      Linus Orn at [facility] received insurance authorization. Patient can discharge today . Discussed with Mrs Doucet via phone and Dr Benny Lennert. Once discharge summary completed will send to [facility] for discharge today.   Expected Discharge Plan: [place] Barriers to Discharge: Continued Medical Work up  Expected Discharge Plan and Services Expected Discharge Plan: [place]   Discharge Planning Services: CM Consult Post Acute Care Choice: [place] arrangements for the past 2 months: [place]: [facility] Date [place]: 02/16/19 Time [place]: 1608 Representative spoke with at [place]: Cassie   Social Determinants of Health ([place]) Interventions    Readmission Risk Interventions No flowsheet data found.

## 2019-02-23 NOTE — Progress Notes (Signed)
Inpatient Rehabilitation Admissions Coordinator  Inpatient rehab consult received. Noted that authorization was pending to admit pt to SNF until therapy change in recommendation yesterday afternoon. I spoke with Nira Conn, RN CM, and insurance has approved SNF from previous recommendations. I will not pursue inpt rehab admit at this time, for [place] healthcare Medicare has approved prior SNF request.  Danne Baxter, RN, MSN Rehab Admissions Coordinator [phone] 02/23/2019 11:49 AM

## 2019-02-24 NOTE — TOC Transition Note (Signed)
Transition of Care [facility]) - CM/SW Discharge Note   Patient Details  Name: [name] MRN: [id number] Date of Birth: 09/20/1940  Transition of Care [facility]) CM/SW Contact:  Marilu Favre, RN Phone Number: 02/24/2019, 10:45 AM   Clinical Narrative:     Patient to discharge to [facility] today. MD has updated discharge summary.   Linus Orn at [facility] ready to receive patient .   Spoke to patient's wife [name] [phone] , she is aware discharge is today and in agreement with [employer]   Bedside nurse to call report to [phone] . Bedside nurse requesting PT[facility] for noon.   Called PT[facility] spoke to [place] requested noon transport.     Barriers to Discharge: Continued Medical Work up   Patient Goals and CMS Choice Patient states their goals for this hospitalization and ongoing recovery are:: to go home CMS Medicare.gov Compare Post Acute Care list provided to:: Patient Represenative (must comment)(wife) Choice offered to / list presented to : Spouse  Discharge Placement                       Discharge Plan and Services   Discharge Planning Services: CM Consult Post Acute Care Choice: [place]: [facility] Date [place]: 02/16/19 Time [place]: 1608 Representative spoke with at [place]: Cassie  Social Determinants of Health ([place]) Interventions     Readmission Risk Interventions No flowsheet data found.

## 2019-02-24 NOTE — Progress Notes (Signed)
  Speech Language Pathology Treatment: Dysphagia  Patient Details Name: DATRELL DUNTON MRN: [id number] DOB: Nov 28, 1940 Today's Date: 02/24/2019 Time: 8768-1157 SLP Time Calculation (min) (ACUTE ONLY): 8 min  Assessment / Plan / Recommendation Clinical Impression  Patient seen for f/u diet tolerance assessment. Patient able to self feed dysphagia 3 solids and thin liquids with independent use of safe swallowing precautions and no overt indication of dysphagia. Oral phase remains moderately delayed but functional. Note that patient with plans to discharge to SNF today. Recommend SLP f/u at SNF briefly to determine ability to advance solids further. For now, dysphagia 3 continues to be appropriate.     HPI HPI: 78 y.o. male with PMHx of hypertension, hyperlipidemia, new diagnosis of parkinsons dementia presented 8/22 with decreased oral intake and fever. Dx ARF, mild rhabdomyolysis, sepsis.       SLP Plan  Continue with current plan of care       Recommendations  Diet recommendations: Dysphagia 3 (mechanical soft);Thin liquid Liquids provided via: Cup;Straw Medication Administration: Whole meds with puree Supervision: Intermittent supervision to cue for compensatory strategies Compensations: Slow rate;Small sips/bites;Minimize environmental distractions;Lingual sweep for clearance of pocketing Postural Changes and/or Swallow Maneuvers: Seated upright 90 degrees                Oral Care Recommendations: Oral care BID Follow up Recommendations: Skilled Nursing facility SLP Visit Diagnosis: Dysphagia, unspecified (R13.10) Plan: Continue with current plan of care       [place], [place] 02/24/2019, 11:07 AM

## 2019-02-24 NOTE — Progress Notes (Signed)
Pt is discharged to go to SNF via PTAR.  Attempted to talk to his wife twice to keep her updated.  Left messages to call me back.

## 2019-02-24 NOTE — Progress Notes (Signed)
Occupational Therapy Treatment Patient Details Name: MATHEW POSTIGLIONE MRN: [id number] DOB: 16-Jan-1941 Today's Date: 02/24/2019    History of present illness Pt presents on 02/13/19 with decreased oral intake, new diagnosis of Parkinson's dementia & with PMH significant for HTN. Pt found to have ARF, mild rhabdomylosis and sespis.   OT comments  Pt progressing well with OT goals. Pt MOD A for bed mobility needing assist with sequencing how to scoot to EOB from long leg sitting. Pt complete LB dressing from EOB with supervision for safety when reaching out of BOS. Pt ambulate from EOB >bathroom with RW and MIN guard assist for standing grooming at sink. Pt complete UB Bathing with supervision in standing using the RW and sink for balance. UB dressing with MOD A to sequencing donning novel hospital gown. Pt likely to dc to SNF today. Will continue to follow for acute OT needs.   Follow Up Recommendations  SNF    Equipment Recommendations  3 in 1 bedside commode    Recommendations for Other Services      Precautions / Restrictions Precautions Precautions: Fall Restrictions Weight Bearing Restrictions: No       Mobility Bed Mobility Overal bed mobility: Needs Assistance Bed Mobility: Supine to Sit     Supine to sit: Mod assist     General bed mobility comments: assist to sequence scooting to EOB  Transfers Overall transfer level: Needs assistance Equipment used: Rolling walker (2 wheeled) Transfers: Sit to/from Stand Sit to Stand: Min assist         General transfer comment: cues for body mechanics during functional mobility to stay within RW; cues to reach back for surface when sitting    Balance Overall balance assessment: Needs assistance Sitting-balance support: Feet supported Sitting balance-Leahy Scale: Good Sitting balance - Comments: able to reach down to pull on socks while sitting EOB   Standing balance support: Single extremity supported;During functional  activity Standing balance-Leahy Scale: Poor Standing balance comment: able to maintain standing balance during UB bathing but unable to reach out of BOS to wet wash cloth or wash back                           ADL either performed or assessed with clinical judgement   ADL Overall ADL's : Needs assistance/impaired     Grooming: Wash/dry face;Min guard;Standing Grooming Details (indicate cue type and reason): Min gaurd for balance and support while standing at sink Upper Body Bathing: Supervision/ safety;Standing Upper Body Bathing Details (indicate cue type and reason): slow to initiate task     Upper Body Dressing : Moderate assistance Upper Body Dressing Details (indicate cue type and reason): sequencing of donning gown Lower Body Dressing: Supervision/safety;Sit to/from stand Lower Body Dressing Details (indicate cue type and reason): from EOB- supervision when reaching out of BOS Toilet Transfer: RW;Ambulation;Min guard;Cueing for safety Toilet Transfer Details (indicate cue type and reason): cues to stay inside RW during ambulation         Functional mobility during ADLs: Rolling walker;Min guard General ADL Comments: cues for safety and sequencing; limited by cogniton     Vision       Perception     Praxis      Cognition Arousal/Alertness: Awake/alert Behavior During Therapy: WFL for tasks assessed/performed Overall Cognitive Status: Impaired/Different from baseline Area of Impairment: Memory;Safety/judgement;Problem solving                 Orientation Level: Situation;Place  Current Attention Level: Selective Memory: Decreased short-term memory Following Commands: Follows one step commands with increased time Safety/Judgement: Decreased awareness of safety;Decreased awareness of deficits Awareness: Intellectual Problem Solving: Slow processing;Requires verbal cues;Requires tactile cues;Decreased initiation;Difficulty sequencing General  Comments: asking why he can't wear jeans and why we only have gowns        Exercises     Shoulder Instructions       General Comments      Pertinent Vitals/ Pain       Pain Assessment: No/denies pain  Home Living                                          Prior Functioning/Environment              Frequency  Min 2X/week        Progress Toward Goals  OT Goals(current goals can now be found in the care plan section)  Progress towards OT goals: Progressing toward goals  Acute Rehab OT Goals Time For Goal Achievement: 03/01/19 Potential to Achieve Goals: Good  Plan Discharge plan remains appropriate    Co-evaluation                 AM-PAC OT "6 Clicks" Daily Activity     Outcome Measure   Help from another person eating meals?: A Little Help from another person taking care of personal grooming?: A Little Help from another person toileting, which includes using toliet, bedpan, or urinal?: A Lot Help from another person bathing (including washing, rinsing, drying)?: A Little Help from another person to put on and taking off regular upper body clothing?: A Little Help from another person to put on and taking off regular lower body clothing?: A Lot 6 Click Score: 16    End of Session Equipment Utilized During Treatment: Gait belt;Rolling walker  OT Visit Diagnosis: Unsteadiness on feet (R26.81);Other abnormalities of gait and mobility (R26.89);Muscle weakness (generalized) (M62.81);Other symptoms and signs involving cognitive function   Activity Tolerance Patient tolerated treatment well   Patient Left in chair;with call bell/phone within reach;with chair alarm set   Nurse Communication Mobility status;Other (comment)(in chair)        Time: [id number] OT Time Calculation (min): 21 min  Charges: OT General Charges $OT Visit: 1 Visit OT Treatments $Self Care/Home Management : 8-22 mins     Angelina PihMary K Kelce Bouton, COTA/L 02/24/2019, 9:21  AM  [phone]

## 2019-02-25 ENCOUNTER — Telehealth: Payer: Self-pay | Admitting: *Deleted

## 2019-02-25 NOTE — Telephone Encounter (Signed)
Acute renal failure. Pt was c/o of decrease oral intake, and started running fever per wife. After evaluation and treatment it states this was likely due to sepsis of unknown source. Pt was d/c 02/24/19 to SNF. Per summary will need to see PCP 7 days after leaving SNF.Marland KitchenJohny [name]

## 2019-02-25 NOTE — Telephone Encounter (Signed)
Pt was on TCM report admitted 02/11/19 for

## 2019-03-18 ENCOUNTER — Ambulatory Visit (INDEPENDENT_AMBULATORY_CARE_PROVIDER_SITE_OTHER): Payer: Medicare Other | Admitting: Internal Medicine

## 2019-03-18 ENCOUNTER — Encounter: Payer: Self-pay | Admitting: Internal Medicine

## 2019-03-18 ENCOUNTER — Other Ambulatory Visit (INDEPENDENT_AMBULATORY_CARE_PROVIDER_SITE_OTHER): Payer: Medicare Other

## 2019-03-18 ENCOUNTER — Other Ambulatory Visit: Payer: Self-pay

## 2019-03-18 DIAGNOSIS — N183 Chronic kidney disease, stage 3 unspecified: Secondary | ICD-10-CM

## 2019-03-18 DIAGNOSIS — I10 Essential (primary) hypertension: Secondary | ICD-10-CM | POA: Diagnosis not present

## 2019-03-18 DIAGNOSIS — G20 Parkinson's disease: Secondary | ICD-10-CM | POA: Diagnosis not present

## 2019-03-18 DIAGNOSIS — R600 Localized edema: Secondary | ICD-10-CM | POA: Diagnosis not present

## 2019-03-18 DIAGNOSIS — F0280 Dementia in other diseases classified elsewhere without behavioral disturbance: Secondary | ICD-10-CM | POA: Diagnosis not present

## 2019-03-18 DIAGNOSIS — E538 Deficiency of other specified B group vitamins: Secondary | ICD-10-CM | POA: Diagnosis not present

## 2019-03-18 LAB — CBC WITH DIFFERENTIAL/PLATELET
Basophils Absolute: 0.1 10*3/uL (ref 0.0–0.1)
Basophils Relative: 1.2 % (ref 0.0–3.0)
Eosinophils Absolute: 0 10*3/uL (ref 0.0–0.7)
Eosinophils Relative: 1 % (ref 0.0–5.0)
HCT: 28.8 % — ABNORMAL LOW (ref 39.0–52.0)
Hemoglobin: 9.9 g/dL — ABNORMAL LOW (ref 13.0–17.0)
Lymphocytes Relative: 32 % (ref 12.0–46.0)
Lymphs Abs: 1.4 10*3/uL (ref 0.7–4.0)
MCHC: 34.2 g/dL (ref 30.0–36.0)
MCV: 87.9 fl (ref 78.0–100.0)
Monocytes Absolute: 0.4 10*3/uL (ref 0.1–1.0)
Monocytes Relative: 9.5 % (ref 3.0–12.0)
Neutro Abs: 2.5 10*3/uL (ref 1.4–7.7)
Neutrophils Relative %: 56.3 % (ref 43.0–77.0)
Platelets: 210 10*3/uL (ref 150.0–400.0)
RBC: 3.28 Mil/uL — ABNORMAL LOW (ref 4.22–5.81)
RDW: 14.3 % (ref 11.5–15.5)
WBC: 4.5 10*3/uL (ref 4.0–10.5)

## 2019-03-18 LAB — HEPATIC FUNCTION PANEL
ALT: 9 U/L (ref 0–53)
AST: 11 U/L (ref 0–37)
Albumin: 3.3 g/dL — ABNORMAL LOW (ref 3.5–5.2)
Alkaline Phosphatase: 94 U/L (ref 39–117)
Bilirubin, Direct: 0.1 mg/dL (ref 0.0–0.3)
Total Bilirubin: 0.8 mg/dL (ref 0.2–1.2)
Total Protein: 5.9 g/dL — ABNORMAL LOW (ref 6.0–8.3)

## 2019-03-18 LAB — BASIC METABOLIC PANEL
BUN: 16 mg/dL (ref 6–23)
CO2: 28 mEq/L (ref 19–32)
Calcium: 8.9 mg/dL (ref 8.4–10.5)
Chloride: 105 mEq/L (ref 96–112)
Creatinine, Ser: 1.43 mg/dL (ref 0.40–1.50)
GFR: 57.68 mL/min — ABNORMAL LOW (ref >60.00)
Glucose, Bld: 118 mg/dL — ABNORMAL HIGH (ref 70–99)
Potassium: 4.4 mEq/L (ref 3.5–5.1)
Sodium: 140 mEq/L (ref 135–145)

## 2019-03-18 LAB — TSH: TSH: 1.44 u[IU]/mL (ref 0.35–4.50)

## 2019-03-18 MED ORDER — CYANOCOBALAMIN 1000 MCG/ML IJ SOLN
1000.0000 ug | Freq: Once | INTRAMUSCULAR | Status: AC
  Administered 2019-03-18: 15:00:00 1000 ug via INTRAMUSCULAR

## 2019-03-18 MED ORDER — FUROSEMIDE 40 MG PO TABS: 40.0000 mg | ORAL_TABLET | Freq: Every day | ORAL | 11 refills | Status: DC | PRN

## 2019-03-18 NOTE — Assessment & Plan Note (Signed)
BP Readings from Last 3 Encounters:  03/18/19 130/66  02/24/19 (!) 160/71  12/04/18 130/76

## 2019-03-18 NOTE — Progress Notes (Signed)
Subjective:  Patient ID: [name], male    DOB: Sep 14, 1940  Age: 78 y.o. MRN: [id number]  CC: No chief complaint on file.   HPI OVIE CORNELIO presents for dementia, CRF, FTT He is back home from [facility] C/o RLE edema-  ?new  Outpatient Medications Prior to Visit  Medication Sig Dispense Refill  . acetaminophen (TYLENOL) 325 MG tablet Take 2 tablets (650 mg total) by mouth every 6 (six) hours as needed for mild pain (or Fever >/= 101). 20 tablet 0  . amLODipine (NORVASC) 5 MG tablet Take 1 tablet (5 mg total) by mouth daily.    . carbidopa-levodopa (SINEMET IR) 25-100 MG tablet Take 0.5 tablets by mouth 3 (three) times daily. 45 tablet   . Cholecalciferol (VITAMIN D) 1000 UNITS capsule Take 1 capsule (1,000 Units total) by mouth daily. 100 capsule 3  . cyanocobalamin (,VITAMIN B-12,) 1000 MCG/ML injection 1 ml sq for 5 days, then 1 ml weekly for 4 weeks, then 1 ml every 2 weeks 10 mL 6  . potassium chloride (KLOR-CON) 8 MEQ tablet Take 1 tablet (8 mEq total) by mouth daily. 90 tablet 3  . SYRINGE-NEEDLE, DISP, 3 ML (BD ECLIPSE SYRINGE) 25G X 1" 3 ML MISC Use sq as directed 50 each 3  . traZODone (DESYREL) 50 MG tablet TAKE 2 TABLETS BY MOUTH EVERY DAY AT BEDTIME (Patient taking differently: Take 100 mg by mouth at bedtime. ) 180 tablet 2   No facility-administered medications prior to visit.     ROS: Review of Systems  Constitutional: Positive for fatigue. Negative for appetite change and unexpected weight change.  HENT: Negative for congestion, nosebleeds, sneezing, sore throat and trouble swallowing.   Eyes: Negative for itching and visual disturbance.  Respiratory: Negative for cough.   Cardiovascular: Positive for leg swelling. Negative for chest pain and palpitations.  Gastrointestinal: Negative for abdominal distention, blood in stool, diarrhea and nausea.  Genitourinary: Positive for frequency. Negative for hematuria.  Musculoskeletal: Positive for gait problem.  Negative for back pain, joint swelling and neck pain.  Skin: Negative for rash.  Neurological: Positive for dizziness, weakness and light-headedness. Negative for tremors and speech difficulty.  Psychiatric/Behavioral: Positive for confusion and dysphoric mood. Negative for agitation, self-injury, sleep disturbance and suicidal ideas. The patient is not nervous/anxious.    Incontinent    Objective:  BP 130/66 (BP Location: Left Arm, Patient Position: Sitting, Cuff Size: Normal)   Pulse 62   Temp 97.7 F (36.5 C) (Oral)   Ht 5\' 10"  (1.778 m)   Wt 187 lb (84.8 kg)   SpO2 94%   BMI 26.83 kg/m   BP Readings from Last 3 Encounters:  03/18/19 130/66  02/24/19 (!) 160/71  12/04/18 130/76    Wt Readings from Last 3 Encounters:  03/18/19 187 lb (84.8 kg)  02/24/19 182 lb 12.2 oz (82.9 kg)  12/04/18 195 lb (88.5 kg)    Physical Exam Constitutional:      General: He is not in acute distress.    Appearance: He is well-developed. He is not ill-appearing.     Comments: NAD  Eyes:     Conjunctiva/sclera: Conjunctivae normal.     Pupils: Pupils are equal, round, and reactive to light.  Neck:     Musculoskeletal: Normal range of motion.     Thyroid: No thyromegaly.     Vascular: No JVD.  Cardiovascular:     Rate and Rhythm: Normal rate and regular rhythm.  Heart sounds: Normal heart sounds. No murmur. No friction rub. No gallop.   Pulmonary:     Effort: Pulmonary effort is normal. No respiratory distress.     Breath sounds: Normal breath sounds. No wheezing or rales.  Chest:     Chest wall: No tenderness.  Abdominal:     General: Bowel sounds are normal. There is no distension.     Palpations: Abdomen is soft. There is no mass.     Tenderness: There is no abdominal tenderness. There is no guarding or rebound.  Musculoskeletal: Normal range of motion.        General: Swelling and tenderness present.  Lymphadenopathy:     Cervical: No cervical adenopathy.  Skin:     General: Skin is warm and dry.     Findings: No rash.  Neurological:     Mental Status: He is alert. He is disoriented.     Cranial Nerves: No cranial nerve deficit.     Motor: Weakness present. No abnormal muscle tone.     Coordination: Coordination abnormal.     Gait: Gait abnormal.     Deep Tendon Reflexes: Reflexes are normal and symmetric.  Psychiatric:        Mood and Affect: Mood normal.     LE edema R>L R 2+, L 1+  Walker Confused  Lab Results  Component Value Date   WBC 6.5 02/20/2019   HGB 12.0 (L) 02/20/2019   HCT 36.1 (L) 02/20/2019   PLT 214 02/20/2019   GLUCOSE 109 (H) 02/21/2019   CHOL 183 08/04/2018   TRIG 143.0 08/04/2018   HDL 30.40 (L) 08/04/2018   LDLDIRECT 114.0 12/30/2016   LDLCALC 124 (H) 08/04/2018   ALT 5 02/21/2019   AST 12 (L) 02/21/2019   NA 140 02/21/2019   K 3.5 02/21/2019   CL 104 02/21/2019   CREATININE 1.29 (H) 02/21/2019   BUN 22 02/21/2019   CO2 25 02/21/2019   TSH 2.24 08/04/2018   PSA 0.31 08/04/2018   INR 1.4 (H) 02/13/2019   HGBA1C 5.5 02/13/2019   MICROALBUR 2.9 (H) 12/30/2016    US Renal  Result Date: 02/13/2019 CLINICAL DATA:  Acute renal failure. EXAM: RENAL / URINARY TRACT ULTRASOUND COMPLETE COMPARISON:  None. FINDINGS: Right Kidney: Renal measurements: 12.7 x 4.7 x 3.9 cm = volume: 123 mL . Echogenicity borderline increased. No mass or hydronephrosis visualized. Left Kidney: Renal measurements: 12.6 x 6.1 x 4.7 cm = volume: 188 mL. Echogenicity borderline increased. No mass or hydronephrosis visualized. Bladder: Appears normal for degree of bladder distention. IMPRESSION: No evidence for hydronephrosis. Electronically Signed   By: Kennith Center M.D.   On: 02/13/2019 17:56   Dg Chest Port 1 View  Result Date: 02/13/2019 CLINICAL DATA:  Sepsis EXAM: PORTABLE CHEST 1 VIEW COMPARISON:  07/13/2016 FINDINGS: The heart size and mediastinal contours are within normal limits. Unchanged elevation of the left hemidiaphragm with  associated atelectasis or consolidation. The visualized skeletal structures are unremarkable. IMPRESSION: Unchanged elevation of the left hemidiaphragm with associated atelectasis or consolidation. No new or acute appearing airspace opacity. Electronically Signed   By: Lauralyn Primes M.D.   On: 02/13/2019 15:28    Assessment & Plan:   There are no diagnoses linked to this encounter.   No orders of the defined types were placed in this encounter.    Follow-up: No follow-ups on file.  Sonda Primes, MD

## 2019-03-18 NOTE — Assessment & Plan Note (Signed)
Labs

## 2019-03-18 NOTE — Assessment & Plan Note (Signed)
Worse Appt w/Dr Delice Lesch pending

## 2019-03-18 NOTE — Assessment & Plan Note (Signed)
LE edema R>L R 2+, L 1+ Re-start Furosemide Doubt DVT Monitor labs

## 2019-03-18 NOTE — Assessment & Plan Note (Signed)
B12 inj q 2 wks

## 2019-03-18 NOTE — Patient Instructions (Signed)
Re-start Furosemide 

## 2019-03-18 NOTE — Addendum Note (Signed)
Addended by: Karren Cobble on: 03/18/2019 03:27 PM   Modules accepted: Orders

## 2019-03-19 ENCOUNTER — Telehealth: Payer: Self-pay | Admitting: Internal Medicine

## 2019-03-19 NOTE — Telephone Encounter (Signed)
Verbals given, FYI 

## 2019-03-19 NOTE — Telephone Encounter (Signed)
Copied from [place] [phone]. Topic: General - Other >> Mar 19, 2019 12:27 PM Keene Breath wrote: Reason for CRM: Called to get verbal orders for OT - 1 wk 3 starting next week.  CB# [phone]

## 2019-03-19 NOTE — Telephone Encounter (Signed)
[name] with [facility] is calling in for verbal orders for PT upon release from rehab.    Frequency: 2 week 3 and 1 week 1 to work on strengthening, balance and gate training.    [id number] Okay to lvm

## 2019-03-20 ENCOUNTER — Other Ambulatory Visit: Payer: Self-pay | Admitting: Internal Medicine

## 2019-03-20 DIAGNOSIS — R5383 Other fatigue: Secondary | ICD-10-CM

## 2019-03-20 DIAGNOSIS — D649 Anemia, unspecified: Secondary | ICD-10-CM

## 2019-03-20 DIAGNOSIS — F0280 Dementia in other diseases classified elsewhere without behavioral disturbance: Secondary | ICD-10-CM

## 2019-03-22 ENCOUNTER — Other Ambulatory Visit: Payer: Self-pay

## 2019-03-22 ENCOUNTER — Ambulatory Visit (INDEPENDENT_AMBULATORY_CARE_PROVIDER_SITE_OTHER): Payer: Medicare Other | Admitting: Neurology

## 2019-03-22 ENCOUNTER — Encounter: Payer: Self-pay | Admitting: Neurology

## 2019-03-22 VITALS — BP 109/63 | HR 71 | Ht 68.0 in | Wt 185.1 lb

## 2019-03-22 DIAGNOSIS — F03B18 Unspecified dementia, moderate, with other behavioral disturbance: Secondary | ICD-10-CM

## 2019-03-22 DIAGNOSIS — G20 Parkinson's disease: Secondary | ICD-10-CM

## 2019-03-22 DIAGNOSIS — F0391 Unspecified dementia with behavioral disturbance: Secondary | ICD-10-CM | POA: Diagnosis not present

## 2019-03-22 MED ORDER — CARBIDOPA-LEVODOPA 25-100 MG PO TABS: ORAL_TABLET | ORAL | 11 refills | Status: DC

## 2019-03-22 NOTE — Patient Instructions (Addendum)
1. Increase the Carbidopa to 1 tablet three times a day with meals (breakfast, lunch, dinner)  2. Continue Trazodone 50mg : take 1-2 tabs every night  3. Call our office for any update in 2 weeks, if no side effects, we can start a medication to hopefully quiet down the hallucinations  4. Use walker at all times, elevate legs  5. Follow-up in 6 months, call for any changes

## 2019-03-22 NOTE — Progress Notes (Signed)
NEUROLOGY FOLLOW UP OFFICE NOTE  MARY HOCKEY [id number] May 13, 1941  HISTORY OF PRESENT ILLNESS: I had the pleasure of seeing Edenilson Austad in follow-up in the neurology clinic on 03/22/2019.  The patient was last seen 4 months ago for memory loss and frequent falls. He is again accompanied by his wife who helps supplement the history today. MOCA 9/30 in May 2020. Records and images were personally reviewed where available. I personally reviewed MRI brain with and without contrast done 11/2018 which did not show any acute changes. There was diffuse atrophy with some temporal lobe predominance, mild to moderate chronic microvascular disease. He was admitted for sepsis with renal failure last August and was in rehab after, back home for a week. He had one fall in rehab and has not had any falls since coming home. On his initial visit, symptoms of frequent falls, gait instability, possible bradykinesia/rigidity described by wife raised concern for a parkinsonian syndrome such as PSP. His wife's main concern was agitation and poor sleep, Trazodone was started, he is on  qhs. While he was in rehab, low dose Sinemet 25/100mg  1/2 tab every 8 hours was started. His wife reports that he refuses Trazodone sometimes because it makes him sleepy and he wants to watch TV and does not want to sleep, making him agitated. He continues to have hallucinations that someone stole his car, wanting to call the police, people were outside with a brown bag, who is the man in bed in the bathroom. Symptoms are worse in the afternoon. He has tried to wander outside. His wife manages medications and finances, she helps with dressing and bathing but feels she has noticed an improvement in his walking since home from PT and potentially initiation of Sinemet.   History on Initial Assessment 11/05/2018: This is a pleasant 78 year old right-handed man with a history of hypertension, diet-controlled hyperlipidemia, presenting for  evaluation of memory loss and frequent falls. He states his balance is off. He also reports that his memory "ain't as good as it used to be." He misplaces things frequently. 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 He would say someone stole something because he could not find them. It was not happening frequently, she would sometimes notice that he would put things where they don't belong. A year ago she started having driving concerns, she would be in the car with him and would ask where he is going, he would stop in the middle of the street. He stopped driving after instructed by Dr. Posey Rea in March 2020. His wife fixes his pillbox and checks behind him. He continues to manage finances and make financial decisions but she has been checking behind him the past 4-5 months. She states that over the past 4-5 months, confusion has been worsening that sometimes he does not realize he is at home, always talking about going home. At times he does not seem to recognize his wife. Symptoms are worse in the afternoon. He also has difficulty understanding instructions to pick his feet up or to help himself up when he falls. He has difficulty getting in and out of bed, or even sometimes moving in bed, as if he cannot turn or move his body up. He has not been sleeping well, waking up at 1am despite recently increased Trazodone  qhs. He has been having visual hallucinations, asking who is that in the bed or in the room, worse in the evening.  She has been helping him with dressing and bathing the past month.  Her main concern have been the significant falls over the past 4 months. Sometimes he can walk independently, but a lot of times she has to help him up, especially getting in and out of bed, or even from a chair. He has a walker now but forgets to pick up his feet to move. He has had 8 or 12 falls in the past 4 months. If he falls and she tries to help him,  he cannot seem to understand instructions to push himself up. He denies any neck or back pain, no leg pain, numbness/tingling. It feels like his legs want to give out on him when he stands. No bowel/bladder dysfunction. He has right shoulder pain. His wife is asking about pain medication when he is so stiff waking up in the morning. He denies any headaches, dizziness, diplopia, dysarthria/dysphagia, anosmia. Sometimes he has some shakiness in his hands like he is nervous. His wife reports personality changes as well, he used to be a jokester. His father had Parkinson's disease. No history of significant head injuries or alcohol use.   I personally reviewed head CT without contrast done 09/23/2018 which did not show any acute changes, there was mild to moderate diffuse atrophy and chronic microvascular disease.  PAST MEDICAL HISTORY: Past Medical History:  Diagnosis Date   B12 deficiency 11/19/2018   Benign prostatic hypertrophy    Edema    Hyperlipidemia    Hypertension    Insomnia    Parasomnia 10/14/2011   PSG 12/15/11>>AHI 0.4, SpO2 low 89%, PLMI 16, somnoliloquay, nocturia    Prediabetes 06/10/2012   Seasonal allergies    Sepsis ([place]) 01/2019   UTI (lower urinary tract infection)     MEDICATIONS: Current Outpatient Medications on File Prior to Visit  Medication Sig Dispense Refill   acetaminophen (TYLENOL) 325 MG tablet Take 2 tablets (650 mg total) by mouth every 6 (six) hours as needed for mild pain (or Fever >/= 101). 20 tablet 0   amLODipine (NORVASC) 5 MG tablet Take 1 tablet (5 mg total) by mouth daily.     carbidopa-levodopa (SINEMET IR) 25-100 MG tablet Take 0.5 tablets by mouth 3 (three) times daily. 45 tablet    Cholecalciferol (VITAMIN D) 1000 UNITS capsule Take 1 capsule (1,000 Units total) by mouth daily. 100 capsule 3   cyanocobalamin (,VITAMIN B-12,) 1000 MCG/ML injection 1 ml sq for 5 days, then 1 ml weekly for 4 weeks, then 1 ml every 2 weeks 10 mL 6    furosemide (LASIX) 40 MG tablet Take 1 tablet (40 mg total) by mouth daily as needed. 30 tablet 11   potassium chloride (KLOR-CON) 8 MEQ tablet Take 1 tablet (8 mEq total) by mouth daily. 90 tablet 3   SYRINGE-NEEDLE, DISP, 3 ML (BD ECLIPSE SYRINGE) 25G X 1" 3 ML MISC Use sq as directed 50 each 3   traZODone (DESYREL) 50 MG tablet TAKE 2 TABLETS BY MOUTH EVERY DAY AT BEDTIME (Patient taking differently: Take 100 mg by mouth at bedtime. ) 180 tablet 2   vitamin B-12 (CYANOCOBALAMIN) 500 MCG tablet Take 500 mcg by mouth daily.     No current facility-administered medications on file prior to visit.     ALLERGIES: Allergies  Allergen Reactions   Amlodipine     Edema w/10 mg   Atorvastatin     REACTION: leg cramps   Penicillins Hives    Has patient had a PCN  reaction causing immediate rash, facial/tongue/throat swelling, SOB or lightheadedness with hypotension: Yes Has patient had a PCN reaction causing severe rash involving mucus membranes or skin necrosis: No Has patient had a PCN reaction that required hospitalization No Has patient had a PCN reaction occurring within the last 10 years: No If all of the above answers are "NO", then may proceed with Cephalosporin use.    Sulfonamide Derivatives Hives   Telmisartan     ?fatigue   Sulfa Antibiotics Other (See Comments) and Palpitations    Sweating, passed out    FAMILY HISTORY: Family History  Problem Relation Age of Onset   Diabetes Mother    Tremor Father    Diabetes Other     SOCIAL HISTORY: Social History   Socioeconomic History   Marital status: Married    Spouse name: Not on file   Number of children: Not on file   Years of education: Not on file   Highest education level: Not on file  Occupational History   Occupation: Retired  Ecologist strain: Not on file   Food insecurity    Worry: Not on file    Inability: Not on Occupational hygienist needs    Medical: Not on  file    Non-medical: Not on file  Tobacco Use   Smoking status: Former Smoker    Packs/day: 1.00    Years: 7.00    Pack years: 7.00    Types: Cigarettes, Cigars   Smokeless tobacco: Never Used   Tobacco comment: cigars day/ 1-2       QIUIT SMOKING IN 2000  Substance and Sexual Activity   Alcohol use: Not Currently    Comment: OCCASSIONAL WINE -1 glass/month   Drug use: No   Sexual activity: Not Currently    Partners: Female  Lifestyle   Physical activity    Days per week: Not on file    Minutes per session: Not on file   Stress: Not on file  Relationships   Social connections    Talks on phone: Not on file    Gets together: Not on file    Attends religious service: Not on file    Active member of club or organization: Not on file    Attends meetings of clubs or organizations: Not on file    Relationship status: Not on file   Intimate partner violence    Fear of current or ex partner: Not on file    Emotionally abused: Not on file    Physically abused: Not on file    Forced sexual activity: Not on file  Other Topics Concern   Not on file  Social History Narrative   Rides Motorcycle   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, does landscaping   Right handed   Lives with wife    REVIEW OF SYSTEMS: Constitutional: No fevers, chills, or sweats, no generalized fatigue, change in appetite Eyes: No visual changes, double vision, eye pain Ear, nose and throat: No hearing loss, ear pain, nasal congestion, sore throat Cardiovascular: No chest pain, palpitations Respiratory:  No shortness of breath at rest or with exertion, wheezes GastrointestinaI: No nausea, vomiting, diarrhea, abdominal pain, fecal incontinence Genitourinary:  No dysuria, urinary retention or frequency Musculoskeletal:  No neck pain, back pain Integumentary: No rash, pruritus, skin lesions Neurological: as above Psychiatric: No depression, insomnia, anxiety Endocrine: No palpitations, fatigue, diaphoresis, mood  swings, change in appetite, change in weight, increased thirst Hematologic/Lymphatic:  No anemia, purpura,  petechiae. Allergic/Immunologic: no itchy/runny eyes, nasal congestion, recent allergic reactions, rashes  PHYSICAL EXAM: Vitals:   03/22/19 1418  BP: 109/63  Pulse: 71   General: No acute distress, hypomimia/masked facies, hypophonia Head:  Normocephalic/atraumatic Skin/Extremities: No rash, no edema Neurological Exam: alert and oriented to person, place, and time. No aphasia or dysarthria. Fund of knowledge is reduced.  Recent and remote memory are impaired.  Attention and concentration are reduced.    Able to name objects, difficulty with repetition. Montreal Cognitive Assessment  03/22/2019 11/04/2018  Visuospatial/ Executive (0/5) 0 1  Naming (0/3) 2 1  Attention: Read list of digits (0/2) 1 1  Attention: Read list of letters (0/1) 0 0  Attention: Serial 7 subtraction starting at 100 (0/3) 0 0  Language: Repeat phrase (0/2) 0 1  Language : Fluency (0/1) 0 0  Abstraction (0/2) 0 0  Delayed Recall (0/5) 0 0  Orientation (0/6) 6 5  Total 9 9  Adjusted Score (based on education) 10 9   Cranial nerves: Pupils equal, round, reactive to light. Extraocular movements intact with no nystagmus. Visual fields full. Facial sensation intact. No facial asymmetry. Tongue, uvula, palate midline.  Motor: +cogwheeling. Muscle strength 5/5 throughout with no pronator drift.  Sensation to light touch, temperature and vibration intact.  No extinction to double simultaneous stimulation.  Deep tendon reflexes +2 throughout, toes downgoing.  Finger to nose testing intact.  Gait narrow-based with fair arm swing. Romberg negative. Negative pull test. No tremor today. Good finger and foot taps. Patient had just taken Sinemet dose prior to the visit.   IMPRESSION: This is a pleasant 78 yo RH man with a history of hypertension, hyperlipidemia, who presented with worsening memory and frequent falls. His  MOCA score today is 10/30 (9/30 in May 2020). The frequent falls, gait instability, and bradykinesia/rigidity raises concern for a parkinsonian syndrome such as PSP, he has hypomimia, cogwheeling today, no tremor or significant bradykinesia, however he had taken Sinemet just prior to the visit. His wife feels this is helping, increase Sinemet 25/100mg  to 1 tab TID with meals. Continue Trazodone 50mg  1-2 tabs qhs for insomnia and behavioral changes associated with dementia, his wife will call our office for an update in 2 weeks, with plans to start rivastigmine for cognitive and behavioral changes. He was advised to use a walker at all time, continue 24/7 care. He does not drive. Caregiver support provided to wife. Follow-up in 6 months, they know to call for any changes.  Thank you for allowing me to participate in his care.  Please do not hesitate to call for any questions or concerns.  The duration of this appointment visit was 30 minutes of face-to-face time with the patient.  Greater than 50% of this time was spent in counseling, explanation of diagnosis, planning of further management, and coordination of care.   Patrcia DollyKaren Jameisha Stofko, M.D.   CC: Dr. Posey ReaPlotnikov

## 2019-03-30 DIAGNOSIS — F0280 Dementia in other diseases classified elsewhere without behavioral disturbance: Secondary | ICD-10-CM

## 2019-03-30 DIAGNOSIS — R7303 Prediabetes: Secondary | ICD-10-CM

## 2019-03-30 DIAGNOSIS — N400 Enlarged prostate without lower urinary tract symptoms: Secondary | ICD-10-CM

## 2019-03-30 DIAGNOSIS — G20 Parkinson's disease: Secondary | ICD-10-CM

## 2019-04-28 ENCOUNTER — Encounter: Payer: Self-pay | Admitting: Internal Medicine

## 2019-04-28 ENCOUNTER — Ambulatory Visit (INDEPENDENT_AMBULATORY_CARE_PROVIDER_SITE_OTHER): Payer: Medicare Other | Admitting: Internal Medicine

## 2019-04-28 ENCOUNTER — Other Ambulatory Visit: Payer: Self-pay

## 2019-04-28 DIAGNOSIS — F0280 Dementia in other diseases classified elsewhere without behavioral disturbance: Secondary | ICD-10-CM

## 2019-04-28 DIAGNOSIS — R600 Localized edema: Secondary | ICD-10-CM

## 2019-04-28 DIAGNOSIS — E538 Deficiency of other specified B group vitamins: Secondary | ICD-10-CM | POA: Diagnosis not present

## 2019-04-28 DIAGNOSIS — I10 Essential (primary) hypertension: Secondary | ICD-10-CM

## 2019-04-28 DIAGNOSIS — G20 Parkinson's disease: Secondary | ICD-10-CM | POA: Diagnosis not present

## 2019-04-28 DIAGNOSIS — R296 Repeated falls: Secondary | ICD-10-CM

## 2019-04-28 MED ORDER — AMLODIPINE BESYLATE 5 MG PO TABS: 2.5000 mg | ORAL_TABLET | Freq: Every day | ORAL | 3 refills | Status: DC

## 2019-04-28 NOTE — Assessment & Plan Note (Signed)
B 12

## 2019-04-28 NOTE — Progress Notes (Signed)
Subjective:  Patient ID: [name], male    DOB: 1941-01-31  Age: 78 y.o. MRN: [id number]  CC: No chief complaint on file.   HPI [place] [facility] presents for Parkinson's, dementia, HTN, edema f/u  Outpatient Medications Prior to Visit  Medication Sig Dispense Refill  . acetaminophen (TYLENOL) 325 MG tablet Take 2 tablets (650 mg total) by mouth every 6 (six) hours as needed for mild pain (or Fever >/= 101). 20 tablet 0  . amLODipine (NORVASC) 5 MG tablet Take 1 tablet (5 mg total) by mouth daily.    . carbidopa-levodopa (SINEMET IR) 25-100 MG tablet Take 1 tablet three times a day with meals 90 tablet 11  . Cholecalciferol (VITAMIN D) 1000 UNITS capsule Take 1 capsule (1,000 Units total) by mouth daily. 100 capsule 3  . cyanocobalamin (,VITAMIN B-12,) 1000 MCG/ML injection 1 ml sq for 5 days, then 1 ml weekly for 4 weeks, then 1 ml every 2 weeks 10 mL 6  . furosemide (LASIX) 40 MG tablet Take 1 tablet (40 mg total) by mouth daily as needed. 30 tablet 11  . potassium chloride (KLOR-CON) 8 MEQ tablet Take 1 tablet (8 mEq total) by mouth daily. 90 tablet 3  . SYRINGE-NEEDLE, DISP, 3 ML (BD ECLIPSE SYRINGE) 25G X 1" 3 ML MISC Use sq as directed 50 each 3  . traZODone (DESYREL) 50 MG tablet TAKE 2 TABLETS BY MOUTH EVERY DAY AT BEDTIME (Patient taking differently: Take 100 mg by mouth at bedtime. ) 180 tablet 2  . vitamin B-12 (CYANOCOBALAMIN) 500 MCG tablet Take 500 mcg by mouth daily.     No facility-administered medications prior to visit.     ROS: Review of Systems  Constitutional: Positive for fatigue. Negative for appetite change and unexpected weight change.  HENT: Negative for congestion, nosebleeds, sneezing, sore throat and trouble swallowing.   Eyes: Negative for itching and visual disturbance.  Respiratory: Negative for cough.   Cardiovascular: Negative for chest pain, palpitations and leg swelling.  Gastrointestinal: Negative for abdominal distention, blood in stool,  diarrhea and nausea.  Genitourinary: Negative for frequency and hematuria.  Musculoskeletal: Negative for back pain, gait problem, joint swelling and neck pain.  Skin: Negative for rash.  Neurological: Positive for weakness. Negative for dizziness, tremors and speech difficulty.  Psychiatric/Behavioral: Positive for confusion and decreased concentration. Negative for agitation, behavioral problems, dysphoric mood and sleep disturbance. The patient is not nervous/anxious.     Objective:  BP 124/68 (BP Location: Right Arm, Patient Position: Sitting, Cuff Size: Large)   Pulse (!) 54   Temp 97.6 F (36.4 C) (Oral)   Ht 5\' 8"  (1.727 m)   Wt 180 lb (81.6 kg)   SpO2 98%   BMI 27.37 kg/m   BP Readings from Last 3 Encounters:  04/28/19 124/68  03/22/19 109/63  03/18/19 130/66    Wt Readings from Last 3 Encounters:  04/28/19 180 lb (81.6 kg)  03/22/19 185 lb 2 oz (84 kg)  03/18/19 187 lb (84.8 kg)    Physical Exam Constitutional:      General: He is not in acute distress.    Appearance: He is well-developed.     Comments: NAD  Eyes:     Conjunctiva/sclera: Conjunctivae normal.     Pupils: Pupils are equal, round, and reactive to light.  Neck:     Musculoskeletal: Normal range of motion.     Thyroid: No thyromegaly.     Vascular: No JVD.  Cardiovascular:  Rate and Rhythm: Normal rate and regular rhythm.     Heart sounds: Normal heart sounds. No murmur. No friction rub. No gallop.   Pulmonary:     Effort: Pulmonary effort is normal. No respiratory distress.     Breath sounds: Normal breath sounds. No wheezing or rales.  Chest:     Chest wall: No tenderness.  Abdominal:     General: Bowel sounds are normal. There is no distension.     Palpations: Abdomen is soft. There is no mass.     Tenderness: There is no abdominal tenderness. There is no guarding or rebound.  Musculoskeletal: Normal range of motion.        General: No tenderness.     Right lower leg: Edema present.      Left lower leg: Edema present.  Lymphadenopathy:     Cervical: No cervical adenopathy.  Skin:    General: Skin is warm and dry.     Findings: No rash.  Neurological:     Mental Status: He is alert. Mental status is at baseline. He is disoriented.     Cranial Nerves: No cranial nerve deficit.     Motor: No abnormal muscle tone.     Coordination: Coordination abnormal.     Gait: Gait abnormal.     Deep Tendon Reflexes: Reflexes are normal and symmetric.  Psychiatric:        Mood and Affect: Mood normal.        Behavior: Behavior normal.    Mild tremor Flat affect Trace edema ataxia  Lab Results  Component Value Date   WBC 4.5 03/18/2019   HGB 9.9 (L) 03/18/2019   HCT 28.8 (L) 03/18/2019   PLT 210.0 03/18/2019   GLUCOSE 118 (H) 03/18/2019   CHOL 183 08/04/2018   TRIG 143.0 08/04/2018   HDL 30.40 (L) 08/04/2018   LDLDIRECT 114.0 12/30/2016   LDLCALC 124 (H) 08/04/2018   ALT 9 03/18/2019   AST 11 03/18/2019   NA 140 03/18/2019   K 4.4 03/18/2019   CL 105 03/18/2019   CREATININE 1.43 03/18/2019   BUN 16 03/18/2019   CO2 28 03/18/2019   TSH 1.44 03/18/2019   PSA 0.31 08/04/2018   INR 1.4 (H) 02/13/2019   HGBA1C 5.5 02/13/2019   MICROALBUR 2.9 (H) 12/30/2016    US Renal  Result Date: 02/13/2019 CLINICAL DATA:  Acute renal failure. EXAM: RENAL / URINARY TRACT ULTRASOUND COMPLETE COMPARISON:  None. FINDINGS: Right Kidney: Renal measurements: 12.7 x 4.7 x 3.9 cm = volume: 123 mL . Echogenicity borderline increased. No mass or hydronephrosis visualized. Left Kidney: Renal measurements: 12.6 x 6.1 x 4.7 cm = volume: 188 mL. Echogenicity borderline increased. No mass or hydronephrosis visualized. Bladder: Appears normal for degree of bladder distention. IMPRESSION: No evidence for hydronephrosis. Electronically Signed   By: Kennith Center M.D.   On: 02/13/2019 17:56   Dg Chest Port 1 View  Result Date: 02/13/2019 CLINICAL DATA:  Sepsis EXAM: PORTABLE CHEST 1 VIEW  COMPARISON:  07/13/2016 FINDINGS: The heart size and mediastinal contours are within normal limits. Unchanged elevation of the left hemidiaphragm with associated atelectasis or consolidation. The visualized skeletal structures are unremarkable. IMPRESSION: Unchanged elevation of the left hemidiaphragm with associated atelectasis or consolidation. No new or acute appearing airspace opacity. Electronically Signed   By: Lauralyn Primes M.D.   On: 02/13/2019 15:28    Assessment & Plan:   There are no diagnoses linked to this encounter.   No orders of the  defined types were placed in this encounter.    Follow-up: No follow-ups on file.  Walker Kehr, MD

## 2019-04-28 NOTE — Assessment & Plan Note (Signed)
Sinemet po 

## 2019-04-28 NOTE — Assessment & Plan Note (Signed)
Better Cont w/Norvasc - reduce to 1/2 tab/day due to LE swelling

## 2019-04-28 NOTE — Assessment & Plan Note (Signed)
Better on Sinemet po

## 2019-04-28 NOTE — Assessment & Plan Note (Signed)
Norvasc - reduce to 1/2 tab/day due to LE swelling

## 2019-07-03 ENCOUNTER — Emergency Department (HOSPITAL_COMMUNITY): Payer: Medicare Other

## 2019-07-03 ENCOUNTER — Observation Stay (HOSPITAL_COMMUNITY): Payer: Medicare Other

## 2019-07-03 ENCOUNTER — Inpatient Hospital Stay (HOSPITAL_COMMUNITY)
Admission: EM | Admit: 2019-07-03 | Discharge: 2019-07-06 | DRG: 853 | Disposition: A | Discharge status: Home Health | Payer: Medicare Other | Attending: Internal Medicine | Admitting: Internal Medicine

## 2019-07-03 ENCOUNTER — Encounter (HOSPITAL_COMMUNITY): Payer: Self-pay | Admitting: Emergency Medicine

## 2019-07-03 DIAGNOSIS — E860 Dehydration: Secondary | ICD-10-CM

## 2019-07-03 DIAGNOSIS — N1330 Unspecified hydronephrosis: Secondary | ICD-10-CM

## 2019-07-03 DIAGNOSIS — N400 Enlarged prostate without lower urinary tract symptoms: Secondary | ICD-10-CM | POA: Diagnosis present

## 2019-07-03 DIAGNOSIS — G20A1 Parkinson's disease without dyskinesia, without mention of fluctuations: Secondary | ICD-10-CM | POA: Diagnosis present

## 2019-07-03 DIAGNOSIS — N202 Calculus of kidney with calculus of ureter: Secondary | ICD-10-CM | POA: Diagnosis present

## 2019-07-03 DIAGNOSIS — N136 Pyonephrosis: Secondary | ICD-10-CM | POA: Diagnosis present

## 2019-07-03 DIAGNOSIS — A419 Sepsis, unspecified organism: Secondary | ICD-10-CM

## 2019-07-03 DIAGNOSIS — R296 Repeated falls: Secondary | ICD-10-CM

## 2019-07-03 DIAGNOSIS — Z79899 Other long term (current) drug therapy: Secondary | ICD-10-CM

## 2019-07-03 DIAGNOSIS — R6520 Severe sepsis without septic shock: Secondary | ICD-10-CM

## 2019-07-03 DIAGNOSIS — E538 Deficiency of other specified B group vitamins: Secondary | ICD-10-CM | POA: Diagnosis present

## 2019-07-03 DIAGNOSIS — G9341 Metabolic encephalopathy: Secondary | ICD-10-CM | POA: Diagnosis present

## 2019-07-03 DIAGNOSIS — Z880 Allergy status to penicillin: Secondary | ICD-10-CM

## 2019-07-03 DIAGNOSIS — E785 Hyperlipidemia, unspecified: Secondary | ICD-10-CM | POA: Diagnosis present

## 2019-07-03 DIAGNOSIS — Z1611 Resistance to penicillins: Secondary | ICD-10-CM | POA: Diagnosis present

## 2019-07-03 DIAGNOSIS — N390 Urinary tract infection, site not specified: Secondary | ICD-10-CM

## 2019-07-03 DIAGNOSIS — N183 Chronic kidney disease, stage 3 unspecified: Secondary | ICD-10-CM | POA: Diagnosis not present

## 2019-07-03 DIAGNOSIS — J302 Other seasonal allergic rhinitis: Secondary | ICD-10-CM | POA: Diagnosis present

## 2019-07-03 DIAGNOSIS — B961 Klebsiella pneumoniae [K. pneumoniae] as the cause of diseases classified elsewhere: Secondary | ICD-10-CM | POA: Diagnosis present

## 2019-07-03 DIAGNOSIS — I129 Hypertensive chronic kidney disease with stage 1 through stage 4 chronic kidney disease, or unspecified chronic kidney disease: Secondary | ICD-10-CM | POA: Diagnosis present

## 2019-07-03 DIAGNOSIS — N132 Hydronephrosis with renal and ureteral calculous obstruction: Secondary | ICD-10-CM | POA: Diagnosis present

## 2019-07-03 DIAGNOSIS — Z66 Do not resuscitate: Secondary | ICD-10-CM | POA: Diagnosis present

## 2019-07-03 DIAGNOSIS — Z20822 Contact with and (suspected) exposure to covid-19: Secondary | ICD-10-CM | POA: Diagnosis present

## 2019-07-03 DIAGNOSIS — I10 Essential (primary) hypertension: Secondary | ICD-10-CM | POA: Diagnosis present

## 2019-07-03 DIAGNOSIS — N179 Acute kidney failure, unspecified: Secondary | ICD-10-CM | POA: Diagnosis not present

## 2019-07-03 DIAGNOSIS — K567 Ileus, unspecified: Secondary | ICD-10-CM

## 2019-07-03 DIAGNOSIS — G4700 Insomnia, unspecified: Secondary | ICD-10-CM | POA: Diagnosis present

## 2019-07-03 DIAGNOSIS — Z87891 Personal history of nicotine dependence: Secondary | ICD-10-CM

## 2019-07-03 DIAGNOSIS — Z882 Allergy status to sulfonamides status: Secondary | ICD-10-CM

## 2019-07-03 DIAGNOSIS — A4159 Other Gram-negative sepsis: Principal | ICD-10-CM | POA: Diagnosis present

## 2019-07-03 DIAGNOSIS — G20 Parkinson's disease: Secondary | ICD-10-CM | POA: Diagnosis present

## 2019-07-03 DIAGNOSIS — K5900 Constipation, unspecified: Secondary | ICD-10-CM

## 2019-07-03 DIAGNOSIS — F0280 Dementia in other diseases classified elsewhere without behavioral disturbance: Secondary | ICD-10-CM | POA: Diagnosis present

## 2019-07-03 DIAGNOSIS — Z87440 Personal history of urinary (tract) infections: Secondary | ICD-10-CM

## 2019-07-03 DIAGNOSIS — Z888 Allergy status to other drugs, medicaments and biological substances status: Secondary | ICD-10-CM

## 2019-07-03 LAB — URINALYSIS, ROUTINE W REFLEX MICROSCOPIC
Bilirubin Urine: NEGATIVE
Glucose, UA: NEGATIVE mg/dL
Ketones, ur: NEGATIVE mg/dL
Nitrite: NEGATIVE
Protein, ur: 30 mg/dL — AB
Specific Gravity, Urine: 1.023 (ref 1.005–1.030)
pH: 5 (ref 5.0–8.0)

## 2019-07-03 LAB — CBC WITH DIFFERENTIAL/PLATELET
Abs Immature Granulocytes: 0.11 10*3/uL — ABNORMAL HIGH (ref 0.00–0.07)
Basophils Absolute: 0 10*3/uL (ref 0.0–0.1)
Basophils Relative: 0 %
Eosinophils Absolute: 0 10*3/uL (ref 0.0–0.5)
Eosinophils Relative: 0 %
HCT: 37.5 % — ABNORMAL LOW (ref 39.0–52.0)
Hemoglobin: 12.5 g/dL — ABNORMAL LOW (ref 13.0–17.0)
Immature Granulocytes: 1 %
Lymphocytes Relative: 6 %
Lymphs Abs: 0.7 10*3/uL (ref 0.7–4.0)
MCH: 30 pg (ref 26.0–34.0)
MCHC: 33.3 g/dL (ref 30.0–36.0)
MCV: 89.9 fL (ref 80.0–100.0)
Monocytes Absolute: 1 10*3/uL (ref 0.1–1.0)
Monocytes Relative: 8 %
Neutro Abs: 10.1 10*3/uL — ABNORMAL HIGH (ref 1.7–7.7)
Neutrophils Relative %: 85 %
Platelets: 120 10*3/uL — ABNORMAL LOW (ref 150–400)
RBC: 4.17 MIL/uL — ABNORMAL LOW (ref 4.22–5.81)
RDW: 12.5 % (ref 11.5–15.5)
WBC: 12 10*3/uL — ABNORMAL HIGH (ref 4.0–10.5)
nRBC: 0 % (ref 0.0–0.2)

## 2019-07-03 LAB — LACTIC ACID, PLASMA
Lactic Acid, Venous: 1.7 mmol/L (ref 0.5–1.9)
Lactic Acid, Venous: 0.9 mmol/L (ref 0.5–1.9)
Lactic Acid, Venous: 0.9 mmol/L (ref 0.5–1.9)

## 2019-07-03 LAB — APTT: aPTT: 33 seconds (ref 24–36)

## 2019-07-03 LAB — COMPREHENSIVE METABOLIC PANEL
ALT: 11 U/L (ref 0–44)
AST: 13 U/L — ABNORMAL LOW (ref 15–41)
Albumin: 3.3 g/dL — ABNORMAL LOW (ref 3.5–5.0)
Alkaline Phosphatase: 56 U/L (ref 38–126)
Anion gap: 8 (ref 5–15)
BUN: 48 mg/dL — ABNORMAL HIGH (ref 8–23)
CO2: 26 mmol/L (ref 22–32)
Calcium: 8.8 mg/dL — ABNORMAL LOW (ref 8.9–10.3)
Chloride: 105 mmol/L (ref 98–111)
Creatinine, Ser: 3.09 mg/dL — ABNORMAL HIGH (ref 0.61–1.24)
GFR calc Af Amer: 21 mL/min — ABNORMAL LOW (ref >60)
GFR calc non Af Amer: 18 mL/min — ABNORMAL LOW (ref >60)
Glucose, Bld: 154 mg/dL — ABNORMAL HIGH (ref 70–99)
Potassium: 3.9 mmol/L (ref 3.5–5.1)
Sodium: 139 mmol/L (ref 135–145)
Total Bilirubin: 1.7 mg/dL — ABNORMAL HIGH (ref 0.3–1.2)
Total Protein: 6.1 g/dL — ABNORMAL LOW (ref 6.5–8.1)

## 2019-07-03 LAB — RESPIRATORY PANEL BY RT PCR (FLU A&B, COVID)
Influenza A by PCR: NEGATIVE
Influenza B by PCR: NEGATIVE
SARS Coronavirus 2 by RT PCR: NEGATIVE

## 2019-07-03 LAB — POC SARS CORONAVIRUS 2 AG -  ED: SARS Coronavirus 2 Ag: NEGATIVE

## 2019-07-03 LAB — SODIUM, URINE, RANDOM: Sodium, Ur: 45 mmol/L

## 2019-07-03 LAB — PROCALCITONIN: Procalcitonin: 4.18 ng/mL

## 2019-07-03 LAB — PROTIME-INR
INR: 1.4 — ABNORMAL HIGH (ref 0.8–1.2)
Prothrombin Time: 16.7 seconds — ABNORMAL HIGH (ref 11.4–15.2)

## 2019-07-03 LAB — CREATININE, URINE, RANDOM: Creatinine, Urine: 222.75 mg/dL

## 2019-07-03 MED ORDER — SODIUM CHLORIDE 0.9 % IV SOLN
1.0000 g | Freq: Once | INTRAVENOUS | Status: AC
  Administered 2019-07-03: 19:00:00 1 g via INTRAVENOUS
  Filled 2019-07-03: qty 10

## 2019-07-03 MED ORDER — TRAZODONE HCL 100 MG PO TABS
100.0000 mg | ORAL_TABLET | Freq: Every day | ORAL | Status: DC
  Administered 2019-07-03 – 2019-07-05 (×3): 100 mg via ORAL
  Filled 2019-07-03 (×3): qty 1

## 2019-07-03 MED ORDER — IOHEXOL 9 MG/ML PO SOLN
ORAL | Status: AC
  Filled 2019-07-03: qty 500

## 2019-07-03 MED ORDER — SODIUM CHLORIDE 0.9 % IV SOLN
1.0000 g | INTRAVENOUS | Status: DC
  Administered 2019-07-04: 18:00:00 1 g via INTRAVENOUS
  Filled 2019-07-03: qty 1

## 2019-07-03 MED ORDER — ACETAMINOPHEN 325 MG PO TABS
650.0000 mg | ORAL_TABLET | Freq: Once | ORAL | Status: AC
  Administered 2019-07-03: 650 mg via ORAL
  Filled 2019-07-03: qty 2

## 2019-07-03 MED ORDER — IOHEXOL 9 MG/ML PO SOLN: 500.0000 mL | ORAL | Status: AC

## 2019-07-03 MED ORDER — LACTATED RINGERS IV BOLUS
1000.0000 mL | Freq: Once | INTRAVENOUS | Status: AC
  Administered 2019-07-03: 1000 mL via INTRAVENOUS

## 2019-07-03 NOTE — H&P (Addendum)
[name]:[id number] DOB: 1940-10-11 DOA: 07/03/2019     PCP: Cassandria Anger, MD   Outpatient Specialists:    NEurology    Dr. Delice Lesch    Patient arrived to ER on 07/03/19 at 1301  Patient coming from: home Lives   With family    Chief Complaint:   Chief Complaint  Patient presents with  . Weakness    HPI: RICHAR [name] is a 79 y.o. male with medical history significant of dementia, Parkinson's, HTN, HLD, falls, frequent UTI    Presented with more confusion and lethargy now requiring assistance for the past 4 days he has strong smelling urine which is typical for him having urinary tract infection.  He has been having harder time ambulating.  Patient has dementia unable to provide his own history.  EMS was called on their arrival febrile up to 101 he was given Tylenol Per family patient have not had any cough no congestion or shortness of breath no nausea no vomiting no diarrhea has not been complaining about back pain abdominal pain his blood pressure has been running a bit low so his wife did not give him any blood pressure medications lately.  Denies any known Covid contacts.  He had prior hx of Sepsis due to UTi   At baseline he does not know at times who is his family he can get agitated at times.  Family reports constipation  Infectious risk factors:  Reports fever     In  ER   PCR COVID TEST  NEGATIVE      Lab Results  Component Value Date   [place] NEGATIVE 07/03/2019   [place] NOT DETECTED 02/22/2019   [place] NOT DETECTED 02/19/2019   [place] NOT DETECTED 02/13/2019     Regarding pertinent Chronic problems:    Hyperlipidemia -  Not on statins  Parkinson Dementia - on Sinemet, trazodone   HTN on Norvasc   CKD stage III - baseline Cr 1.4 Lab Results  Component Value Date   CREATININE 3.09 (H) 07/03/2019   CREATININE 1.43 03/18/2019   CREATININE 1.29 (H) 02/21/2019        While in ER: Noted to have UTI patient was  rehydrated urine culture and blood cultures obtained tested negative for Covid Treated with Rocephin for presumed UTI resulting in acute encephalopathy.  His home medication such as trazodone was restarted and patient became agitated and tried to pull out his own IV.  Per Wife patient has history of significant dementia and sundowning Noted to have AKI no evidence of urinary retention as patient still producing urine but  bladder scan ordered  The following Work up has been ordered so far:  Orders Placed This Encounter  Procedures  . Blood Culture (routine x 2)  . Urine culture  . Respiratory Panel by RT PCR (Flu A&B, Covid) - Nasopharyngeal Swab  . DG Chest Port 1 View  . Comprehensive metabolic panel  . CBC WITH DIFFERENTIAL  . APTT  . Protime-INR  . Urinalysis, Routine w reflex microscopic  . Diet NPO time specified  . Cardiac monitoring  . Consult to hospitalist  ALL PATIENTS BEING ADMITTED/HAVING PROCEDURES NEED COVID-19 SCREENING  . Airborne and Contact precautions  . Pulse oximetry, continuous  . POC SARS Coronavirus 2 Ag-ED - Nasal Swab (BD Veritor Kit)  . ED EKG 12-Lead  . EKG 12-Lead  . EKG 12-Lead     Following Medications were ordered in ER: Medications  traZODone (DESYREL) tablet 100  mg (has no administration in time range)  cefTRIAXone (ROCEPHIN) 1 g in sodium chloride 0.9 % 100 mL IVPB (1 g Intravenous New Bag/Given 07/03/19 1855)  lactated ringers bolus 1,000 mL (0 mLs Intravenous Stopped 07/03/19 1444)  acetaminophen (TYLENOL) tablet 650 mg (650 mg Oral Given 07/03/19 1848)        Consult Orders  (From admission, onward)         Start     Ordered   07/03/19 1831  Consult to hospitalist  ALL PATIENTS BEING ADMITTED/HAVING PROCEDURES NEED COVID-19 SCREENING  Once    Comments: ALL PATIENTS BEING ADMITTED/HAVING PROCEDURES NEED COVID-19 SCREENING  Provider:  (Not yet assigned)  Question Answer Comment  Place call to: [facility] [phone]   Reason for  Consult Admit      07/03/19 1830           Significant initial  Findings: Abnormal Labs Reviewed  COMPREHENSIVE METABOLIC PANEL - Abnormal; Notable for the following components:      Result Value   Glucose, Bld 154 (*)    BUN 48 (*)    Creatinine, Ser 3.09 (*)    Calcium 8.8 (*)    Total Protein 6.1 (*)    Albumin 3.3 (*)    AST 13 (*)    Total Bilirubin 1.7 (*)    GFR calc non Af Amer 18 (*)    GFR calc Af Amer 21 (*)    All other components within normal limits  CBC WITH DIFFERENTIAL/PLATELET - Abnormal; Notable for the following components:   WBC 12.0 (*)    RBC 4.17 (*)    Hemoglobin 12.5 (*)    HCT 37.5 (*)    Platelets 120 (*)    Neutro Abs 10.1 (*)    Abs Immature Granulocytes 0.11 (*)    All other components within normal limits  PROTIME-INR - Abnormal; Notable for the following components:   Prothrombin Time 16.7 (*)    INR 1.4 (*)    All other components within normal limits  URINALYSIS, ROUTINE W REFLEX MICROSCOPIC - Abnormal; Notable for the following components:   APPearance HAZY (*)    Hgb urine dipstick SMALL (*)    Protein, ur 30 (*)    Leukocytes,Ua SMALL (*)    Bacteria, UA MANY (*)    Crystals PRESENT (*)    All other components within normal limits     Otherwise labs showing:    Recent Labs  Lab 07/03/19 1350  NA 139  K 3.9  CO2 26  GLUCOSE 154*  BUN 48*  CREATININE 3.09*  CALCIUM 8.8*    Cr      Up from baseline see below Lab Results  Component Value Date   CREATININE 3.09 (H) 07/03/2019   CREATININE 1.43 03/18/2019   CREATININE 1.29 (H) 02/21/2019    Recent Labs  Lab 07/03/19 1350  AST 13*  ALT 11  ALKPHOS 56  BILITOT 1.7*  PROT 6.1*  ALBUMIN 3.3*   Lab Results  Component Value Date   CALCIUM 8.8 (L) 07/03/2019    WBC      Component Value Date/Time   WBC 12.0 (H) 07/03/2019 1350   ANC    Component Value Date/Time   NEUTROABS 10.1 (H) 07/03/2019 1350     Plt: Lab Results  Component Value Date   PLT  120 (L) 07/03/2019     Lactic Acid, Venous 1.7-0.9    Component Value Date/Time   LATICACIDVEN 1.7 07/03/2019 1330  Procalcitonin  4.18   COVID-19 Labs  No results for input(s): DDIMER, FERRITIN, LDH, CRP in the last 72 hours.  Lab Results  Component Value Date   SARSCOV2NAA NEGATIVE 07/03/2019   SARSCOV2NAA NOT DETECTED 02/22/2019   SARSCOV2NAA NOT DETECTED 02/19/2019   SARSCOV2NAA NOT DETECTED 02/13/2019      HG/HCT   stable,      Component Value Date/Time   HGB 12.5 (L) 07/03/2019 1350   HCT 37.5 (L) 07/03/2019 1350      ECG: Ordered Personally reviewed by me showing: HR : 83 Rhythm: NSR,     no evidence of ischemic changes QTC 401   DM  labs:  HbA1C: Recent Labs    02/13/19 1412  HGBA1C 5.5     UA   evidence of UTI     Urine analysis:    Component Value Date/Time   COLORURINE YELLOW 07/03/2019 1618   APPEARANCEUR HAZY (A) 07/03/2019 1618   LABSPEC 1.023 07/03/2019 1618   PHURINE 5.0 07/03/2019 1618   GLUCOSEU NEGATIVE 07/03/2019 1618   GLUCOSEU NEGATIVE 08/04/2018 0949   HGBUR SMALL (A) 07/03/2019 1618   BILIRUBINUR NEGATIVE 07/03/2019 1618   KETONESUR NEGATIVE 07/03/2019 1618   PROTEINUR 30 (A) 07/03/2019 1618   UROBILINOGEN 1.0 08/04/2018 0949   NITRITE NEGATIVE 07/03/2019 1618   LEUKOCYTESUR SMALL (A) 07/03/2019 1618       Ordered    CXR - NON acute  KUB - showing possible ileus Mild Left hydro - no stone    ED Triage Vitals [07/03/19 1313]  Enc Vitals Group     BP 134/65     Pulse Rate 99     Resp 20     Temp 98.3 F (36.8 C)     Temp Source Oral     SpO2 95 %     Weight      Height      Head Circumference      Peak Flow      Pain Score      Pain Loc      Pain Edu?      Excl. in [place]?   ERDE(08)@       Latest  Blood pressure (!) 177/93, pulse (!) 114, temperature 98.3 F (36.8 C), temperature source Oral, resp. rate 18, SpO2 95 %.     Hospitalist was called for admission for sepsis and UTI   Review of Systems:      Pertinent positives include: , Fevers, chills, fatigue, , loss of appetite, constipation Constitutional:  No weight loss, night sweats weight loss  HEENT:  No headaches, Difficulty swallowing,Tooth/dental problems,Sore throat,  No sneezing, itching, ear ache, nasal congestion, post nasal drip,  Cardio-vascular:  No chest pain, Orthopnea, PND, anasarca, dizziness, palpitations.no Bilateral lower extremity swelling  GI:  No heartburn, indigestion, abdominal pain, nausea, vomiting, diarrhea, change in bowel habits melena, blood in stool, hematemesis Resp:  no shortness of breath at rest. No dyspnea on exertion, No excess mucus, no productive cough, No non-productive cough, No coughing up of blood.No change in color of mucus.No wheezing. Skin:  no rash or lesions. No jaundice GU:  no dysuria, change in color of urine, no urgency or frequency. No straining to urinate.  No flank pain.  Musculoskeletal:  No joint pain or no joint swelling. No decreased range of motion. No back pain.  Psych:  No change in mood or affect. No depression or anxiety. No memory loss.  Neuro: no localizing neurological complaints, no tingling, no  weakness, no double vision, no gait abnormality, no slurred speech, no confusion  All systems reviewed and apart from [place] all are negative  Past Medical History:   Past Medical History:  Diagnosis Date  . B12 deficiency 11/19/2018  . Benign prostatic hypertrophy   . Edema   . Hyperlipidemia   . Hypertension   . Insomnia   . Parasomnia 10/14/2011   PSG 12/15/11>>AHI 0.4, SpO2 low 89%, PLMI 16, somnoliloquay, nocturia   . Prediabetes 06/10/2012  . Seasonal allergies   . Sepsis ([place]) 01/2019  . UTI (lower urinary tract infection)        Past Surgical History:  Procedure Laterality Date  . FINGER SURGERY Right 04/19/2014   RIGHT INDEX & RING FINGER   FROM DOG BITE   . I & D EXTREMITY Right 04/20/2014   Procedure: IRRIGATION AND DEBRIDEMENT right index  finger and right ring finger;  Surgeon: Roseanne Kaufman, MD;  Location: [facility];  Service: Orthopedics;  Laterality: Right;  . I & D EXTREMITY Right 04/21/2014   Procedure: IRRIGATION AND DEBRIDEMENT RIGHT HAND RING FINGER, INDEX FINGER AND Flexor Tenolysis and FDS Tenotomy;  Surgeon: Roseanne Kaufman, MD;  Location: [facility];  Service: Orthopedics;  Laterality: Right;    Social History:  Ambulatory   independently       reports that he has quit smoking. His smoking use included cigarettes and cigars. He has a 7.00 pack-year smoking history. He has never used smokeless tobacco. He reports previous alcohol use. He reports that he does not use drugs.     Family History:   Family History  Problem Relation Age of Onset  . Diabetes Mother   . Tremor Father   . Diabetes Other     Allergies: Allergies  Allergen Reactions  . Amlodipine     Edema w/10 mg  . Atorvastatin     REACTION: leg cramps  . Penicillins Hives    Has patient had a PCN reaction causing immediate rash, facial/tongue/throat swelling, SOB or lightheadedness with hypotension: Yes Has patient had a PCN reaction causing severe rash involving mucus membranes or skin necrosis: No Has patient had a PCN reaction that required hospitalization No Has patient had a PCN reaction occurring within the last 10 years: No If all of the above answers are "NO", then may proceed with Cephalosporin use.   . Sulfonamide Derivatives Hives  . Telmisartan     ?fatigue  . Sulfa Antibiotics Other (See Comments) and Palpitations    Sweating, passed out     Prior to Admission medications   Medication Sig Start Date End Date Taking? Authorizing Provider  acetaminophen (TYLENOL) 325 MG tablet Take 2 tablets (650 mg total) by mouth every 6 (six) hours as needed for mild pain (or Fever >/= 101). 02/23/19   Swayze, Ava, DO  amLODipine (NORVASC) 5 MG tablet Take 0.5 tablets (2.5 mg total) by mouth daily. 04/28/19   Plotnikov, Evie Lacks, MD    carbidopa-levodopa (SINEMET IR) 25-100 MG tablet Take 1 tablet three times a day with meals 03/22/19   Cameron Sprang, MD  Cholecalciferol (VITAMIN D) 1000 UNITS capsule Take 1 capsule (1,000 Units total) by mouth daily. 09/09/12   Plotnikov, Evie Lacks, MD  cyanocobalamin (,VITAMIN B-12,) 1000 MCG/ML injection 1 ml sq for 5 days, then 1 ml weekly for 4 weeks, then 1 ml every 2 weeks 11/19/18   Plotnikov, Evie Lacks, MD  furosemide (LASIX) 40 MG tablet Take 1 tablet (40 mg total) by mouth  daily as needed. 03/18/19 03/17/20  Plotnikov, Evie Lacks, MD  potassium chloride (KLOR-CON) 8 MEQ tablet Take 1 tablet (8 mEq total) by mouth daily. 08/04/18   Plotnikov, Evie Lacks, MD  SYRINGE-NEEDLE, DISP, 3 ML (BD ECLIPSE SYRINGE) 25G X 1" 3 ML MISC Use sq as directed 11/19/18   Plotnikov, Evie Lacks, MD  traZODone (DESYREL) 50 MG tablet TAKE 2 TABLETS BY MOUTH EVERY DAY AT BEDTIME Patient taking differently: Take 100 mg by mouth at bedtime.  12/09/18   Cameron Sprang, MD  vitamin B-12 (CYANOCOBALAMIN) 500 MCG tablet Take 500 mcg by mouth daily.    [provider]   Physical Exam: Blood pressure (!) 177/93, pulse (!) 114, temperature 98.3 F (36.8 C), temperature source Oral, resp. rate 18, SpO2 95 %. 1. General:  in No Acute distress    Chronically ill -appearing 2. Psychological: Alert and   Oriented to self 3. Head/ENT:     Dry Mucous Membranes                          Head Non traumatic, neck supple                          Poor Dentition 4. SKIN: decreased Skin turgor,  Skin clean Dry and intact no rash 5. Heart: Regular rate and rhythm  Murmur, no Rub or gallop 6. Lungs:   no wheezes or crackles   7. Abdomen: Soft,  non-tender, Non distended bowel sounds present 8. Lower extremities: no clubbing, cyanosis, no  edema 9. Neurologically Grossly intact, moving all 4 extremities equally   10. MSK: Normal range of motion   All other LABS:     Recent Labs  Lab 07/03/19 1350  WBC 12.0*   NEUTROABS 10.1*  HGB 12.5*  HCT 37.5*  MCV 89.9  PLT 120*     Recent Labs  Lab 07/03/19 1350  NA 139  K 3.9  CL 105  CO2 26  GLUCOSE 154*  BUN 48*  CREATININE 3.09*  CALCIUM 8.8*     Recent Labs  Lab 07/03/19 1350  AST 13*  ALT 11  ALKPHOS 56  BILITOT 1.7*  PROT 6.1*  ALBUMIN 3.3*       Cultures:    Component Value Date/Time   SDES URINE, RANDOM 02/17/2019 1132   SPECREQUEST NONE 02/17/2019 1132   CULT (A) 02/17/2019 1132    <10,000 COLONIES/mL INSIGNIFICANT GROWTH Performed at [facility] [address] [place], [place] [address]    REPTSTATUS 02/18/2019 FINAL 02/17/2019 1132     Radiological Exams on Admission: DG Abd 1 View  Result Date: 07/03/2019 CLINICAL DATA:  Constipation EXAM: ABDOMEN - 1 VIEW COMPARISON:  None. FINDINGS: Mild diffuse gaseous distention of bowel, likely ileus. No significant increase in stool burden. No organomegaly or free air. No acute bony abnormality. IMPRESSION: Diffuse gaseous distention of bowel, likely ileus. Electronically Signed   By: Rolm Baptise M.D.   On: 07/03/2019 20:17   US Renal  Result Date: 07/03/2019 CLINICAL DATA:  79 year old male with acute renal insufficiency. EXAM: RENAL / URINARY TRACT ULTRASOUND COMPLETE COMPARISON:  Renal ultrasound dated 02/13/2019. FINDINGS: Right Kidney: Renal measurements: 12.6 x 5.1 x 5.1 cm = volume: 172 mL. Mild increased echogenicity. No hydronephrosis or shadowing stone. Left Kidney: Renal measurements: 12.1 x 6.5 x 7.0 cm = volume: 287 mL. Mildly echogenic. There is mild left hydronephrosis. No shadowing  stone. Bladder: The urinary bladder is partially distended and grossly unremarkable. Other: None. IMPRESSION: 1. Mild increased renal echogenicity may be related to chronic kidney disease. 2. Mild left hydronephrosis.  No shadowing stone. Electronically Signed   By: Anner Crete M.D.   On: 07/03/2019 20:49   DG Chest Port 1 View  Result Date: 07/03/2019 CLINICAL DATA:   Weakness.  Hypertension. EXAM: PORTABLE CHEST 1 VIEW COMPARISON:  February 13, 2019 FINDINGS: There is left base scarring. Lungs elsewhere are clear. Heart is upper normal in size with pulmonary vascularity normal. No adenopathy. No bone lesions. IMPRESSION: Scarring left base, stable. Lungs elsewhere clear. Heart upper normal in size. No adenopathy. Electronically Signed   By: Lowella Grip III M.D.   On: 07/03/2019 13:52    Chart has been reviewed   Assessment/Plan   79 y.o. male with medical history significant of dementia, Parkinson's, HTN, HLD, falls, frequent UTI    Admitted for UTI and sepsis and possible ileus with AKI  Present on Admission: . Sepsis ([place]) -   -Patient meets sepsis criteria with  fever   leukocytosis   Tachycardia   Initial lactic acid Lactic Acid, Venous    Component Value Date/Time   LATICACIDVEN 1.7 07/03/2019 1330   Source most likely:  UTI,    -We will rehydrate, treat with IV antibiotics, follow lactic acid - Await results of blood and urine culture and adjust antibiotics as needed - Obtain MRSA serologies    . Essential hypertension - hold Norvasc  . Dyslipidemia - not on statin  . ARF (acute renal failure) (HCC) -likely secondary to dehydration, Will evaluate for any evidence of bladder obstruction or hydronephrosis. Obtain bladder scan  renal ultrasound showed mild hydro on the left echogenic kidneys Will rehydrate Obtain urine electrolytes If no significant improvement despite rehydration may need nephrology consult  . Dementia due to Parkinson's disease without behavioral disturbance ([place]) -expect some degree of sundowning   . CKD (chronic kidney disease), stage III -chronic avoid nephrotoxic medications   . Constipation/ileus -obtainrd KUB showed possible ileus.  Will obtain CT abdomen to further evaluate and evaluate for any possibility of obstruction or intra-abdominal infectious process this patient unable to provide detailed  history. Until CT resulted we will keep n.p.o.  . Acute metabolic encephalopathy -   - most likely multifactorial secondary to combination of   infection    dehydration secondary to decreased by mouth intake,    - Will rehydrate   - treat underlining infection   - restart home meds that help with mood stabilization  - neurological exam appears to be nonfocal but patient unable to cooperate fully     Left hydronephrosis mild -this unlikely to be causing AKI, will evaluate on CT to see if there is any evidence of external obstruction.  No shadowing suggestive of renal stone. If CT scan showing any evidence of nephrolithiasis in the setting of UTI will need urology consult   Other plan as per orders.  DVT prophylaxis:   Lovenox     Code Status:    DNR/DNI   as per   family  I had personally discussed CODE STATUS with  Family     Family Communication:   Family not at  Bedside  plan of care was discussed on the phone with  Wife   Disposition Plan:    To home once workup is complete and patient is stable  Would benefit from PT/OT eval prior to DC  Ordered                   Swallow eval - SLP ordered                                     Consults called: none   Admission status:  ED Disposition    ED Disposition Condition Comment   Admit  The patient appears reasonably stabilized for admission considering the current resources, flow, and capabilities available in the ED at this time, and I doubt any other [facility] requiring further screening and/or treatment in the ED prior to admission is  present.       Obs      Level of care    SDU tele indefinitely please discontinue once patient no longer qualifies   Precautions: admitted as  Covid Negative  Airborne and Contact precautions   PPE: Used by the provider:   P100  eye Goggles,  Gloves    Anubis Fundora 07/03/2019, 9:10 PM    [facility]     after 2 AM please page floor coverage PA If 7AM-7PM,  please contact the day team taking care of the patient using [email or web address]

## 2019-07-03 NOTE — ED Triage Notes (Addendum)
Per EMS, patient from home, patient has no complaints, but family states patient has required more assistance than baseline x4 days. States patient appears weaker with increased difficulty ambulating. Hx UTI and presented similar. Hx dementia. Baseline per family.  Temp 101 Given 1000mg  Tylenol with EMS

## 2019-07-03 NOTE — ED Notes (Signed)
Patient transported to X-ray 

## 2019-07-03 NOTE — ED Notes (Signed)
Condom cath applied to patient. 

## 2019-07-03 NOTE — ED Provider Notes (Signed)
[facility]-EMERGENCY DEPT Provider Note   CSN: [id number] Arrival date & time: 07/03/19  1301     History Chief Complaint  Patient presents with  . Weakness    [name] is a 79 y.o. male.  Patient is a 79 year old male with a history of Parkinson's disease, hypertension, hyperlipidemia, chronic renal disease, prior history of UTI and sepsis who is presenting today via EMS for 5 days of gradual decline.  Patient's wife states that since Tuesday he has been more sleepy, not getting out of bed, generally weak which is worsening and foul-smelling urine.  Today when EMS arrived patient was also febrile which wife states she was not aware of until today.  He has not had cough, congestion or shortness of breath.  He has had no nausea vomiting or diarrhea.  He did take all of his medications yesterday but his wife states that she did check his blood pressure this week and it was running low so he did not receive any of his medications today.  Patient denies any pain anywhere and states the swelling in his legs is chronic.  He denies any unilateral extremity pain or weakness.  No known Covid contacts.  The history is provided by the patient and the spouse.  Weakness Severity:  Moderate Onset quality:  Gradual Duration:  5 days Timing:  Constant Progression:  Worsening      Past Medical History:  Diagnosis Date  . B12 deficiency 11/19/2018  . Benign prostatic hypertrophy   . Edema   . Hyperlipidemia   . Hypertension   . Insomnia   . Parasomnia 10/14/2011   PSG 12/15/11>>AHI 0.4, SpO2 low 89%, PLMI 16, somnoliloquay, nocturia   . Prediabetes 06/10/2012  . Seasonal allergies   . Sepsis (HCC) 01/2019  . UTI (lower urinary tract infection)     Patient Active Problem List   Diagnosis Date Noted  . CRF (chronic renal failure), stage 3 (moderate) 03/18/2019  . Pressure injury of skin 02/14/2019  . ARF (acute renal failure) (HCC) 02/13/2019  . Sepsis (HCC)  02/13/2019  . Protein-calorie malnutrition, severe (HCC) 02/13/2019  . 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 Parkinson's syndrome (HCC) 02/13/2019  . Infected sebaceous cyst 12/04/2018  . Hallucinations 11/19/2018  . Shoulder pain 11/19/2018  . Vitamin B12 deficiency 11/19/2018  . 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 Memory loss 09/21/2018  . Falls frequently 09/21/2018  . Fatigue 10/27/2017  . Edema 07/29/2017  . Chronic venous insufficiency 06/30/2017  . Cellulitis and abscess of leg, except foot 06/30/2017  . Ear pain, referred, right 06/10/2016  . Urinary frequency 04/16/2016  . Cerumen impaction 08/22/2015  . Infection of right hand due to bite 04/20/2014  . Hemifacial spasm 09/15/2013  . Well adult exam 07/16/2013  . Dermatochalasis of eyelid 01/01/2012  . Other facial nerve disorders 01/01/2012  . 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 Twitching 11/04/2011  . Non-REM Parasomnia 10/14/2011  . Dog bite of left hand 08/19/2011  . Wound infection 08/19/2011  . Hypokalemia 12/10/2010  . Hyperglycemia 12/10/2010  . Dyslipidemia   . Benign prostatic hyperplasia   . LEG CRAMPS 03/07/2009  . OTITIS EXTERNA 08/30/2008  . Prostatitis 08/30/2008  . Essential hypertension 08/26/2007  . ERECTILE DYSFUNCTION 08/26/2007    Past Surgical History:  Procedure Laterality Date  . FINGER SURGERY Right 04/19/2014   RIGHT INDEX & RING FINGER   FROM DOG BITE   . I & D  EXTREMITY Right 04/20/2014   Procedure: IRRIGATION AND DEBRIDEMENT right index finger and right ring finger;  Surgeon: Dominica Severin, MD;  Location: [facility] OR;  Service: Orthopedics;  Laterality: Right;  . I & D EXTREMITY Right 04/21/2014   Procedure: IRRIGATION AND DEBRIDEMENT RIGHT HAND RING FINGER, INDEX FINGER AND Flexor Tenolysis and FDS Tenotomy;  Surgeon: Dominica Severin, MD;  Location: [facility] OR;  Service: Orthopedics;  Laterality: Right;       Family History  Problem  Relation Age of Onset  . Diabetes Mother   . Tremor Father   . Diabetes Other     Social History   Tobacco Use  . Smoking status: Former Smoker    Packs/day: 1.00    Years: 7.00    Pack years: 7.00    Types: Cigarettes, Cigars  . Smokeless tobacco: Never Used  . Tobacco comment: cigars day/ 1-2       QIUIT SMOKING IN 2000  Substance Use Topics  . Alcohol use: Not Currently    Comment: OCCASSIONAL WINE -1 glass/month  . Drug use: No    Home Medications Prior to Admission medications   Medication Sig Start Date End Date Taking? Authorizing Provider  acetaminophen (TYLENOL) 325 MG tablet Take 2 tablets (650 mg total) by mouth every 6 (six) hours as needed for mild pain (or Fever >/= 101). 02/23/19   Swayze, Ava, DO  amLODipine (NORVASC) 5 MG tablet Take 0.5 tablets (2.5 mg total) by mouth daily. 04/28/19   Plotnikov, Georgina Quint, MD  carbidopa-levodopa (SINEMET IR) 25-100 MG tablet Take 1 tablet three times a day with meals 03/22/19   Van Clines, MD  Cholecalciferol (VITAMIN D) 1000 UNITS capsule Take 1 capsule (1,000 Units total) by mouth daily. 09/09/12   Plotnikov, Georgina Quint, MD  cyanocobalamin (,VITAMIN B-12,) 1000 MCG/ML injection 1 ml sq for 5 days, then 1 ml weekly for 4 weeks, then 1 ml every 2 weeks 11/19/18   Plotnikov, Georgina Quint, MD  furosemide (LASIX) 40 MG tablet Take 1 tablet (40 mg total) by mouth daily as needed. 03/18/19 03/17/20  Plotnikov, Georgina Quint, MD  potassium chloride (KLOR-CON) 8 MEQ tablet Take 1 tablet (8 mEq total) by mouth daily. 08/04/18   Plotnikov, Georgina Quint, MD  SYRINGE-NEEDLE, DISP, 3 ML (BD ECLIPSE SYRINGE) 25G X 1" 3 ML MISC Use sq as directed 11/19/18   Plotnikov, Georgina Quint, MD  traZODone (DESYREL) 50 MG tablet TAKE 2 TABLETS BY MOUTH EVERY DAY AT BEDTIME Patient taking differently: Take 100 mg by mouth at bedtime.  12/09/18   Van Clines, MD  vitamin B-12 (CYANOCOBALAMIN) 500 MCG tablet Take 500 mcg by mouth daily.    [provider]     Allergies    Amlodipine, Atorvastatin, Penicillins, Sulfonamide derivatives, Telmisartan, and Sulfa antibiotics  Review of Systems   Review of Systems  Neurological: Positive for weakness.  All other systems reviewed and are negative.   Physical Exam Updated Vital Signs BP 134/65 (BP Location: Right Arm)   Pulse 99   Temp 98.3 F (36.8 C) (Oral)   Resp 20   SpO2 95%   Physical Exam Vitals and nursing note reviewed.  Constitutional:      General: He is not in acute distress.    Appearance: Normal appearance. He is well-developed and normal weight.  HENT:     Head: Normocephalic and atraumatic.     Mouth/Throat:     Mouth: Mucous membranes are dry.  Eyes:  Conjunctiva/sclera: Conjunctivae normal.     Pupils: Pupils are equal, round, and reactive to light.  Cardiovascular:     Rate and Rhythm: Regular rhythm. Tachycardia present.     Heart sounds: No murmur.  Pulmonary:     Effort: Pulmonary effort is normal. No respiratory distress.     Breath sounds: Normal breath sounds. No wheezing or rales.  Abdominal:     General: There is no distension.     Palpations: Abdomen is soft.     Tenderness: There is no abdominal tenderness. There is no guarding or rebound.  Musculoskeletal:        General: No tenderness. Normal range of motion.     Cervical back: Normal range of motion and neck supple.     Right lower leg: Edema present.     Left lower leg: Edema present.     Comments: 1+ pitting edema bilaterally to the lower shin.  Multiple abrasions present over bilateral hands.  In different stages of healing.  Also ecchymosis on the upper extremities in various stages of healing.  Skin:    General: Skin is warm and dry.     Findings: No erythema or rash.  Neurological:     General: No focal deficit present.     Mental Status: He is alert.     Comments: Oriented to person and place.  Able to move all extremities without difficulty.  Psychiatric:        Mood and Affect:  Mood normal.        Behavior: Behavior normal.        Thought Content: Thought content normal.     ED Results / Procedures / Treatments   Labs (all labs ordered are listed, but only abnormal results are displayed) Labs Reviewed  COMPREHENSIVE METABOLIC PANEL - Abnormal; Notable for the following components:      Result Value   Glucose, Bld 154 (*)    BUN 48 (*)    Creatinine, Ser 3.09 (*)    Calcium 8.8 (*)    Total Protein 6.1 (*)    Albumin 3.3 (*)    AST 13 (*)    Total Bilirubin 1.7 (*)    GFR calc non Af Amer 18 (*)    GFR calc Af Amer 21 (*)    All other components within normal limits  CBC WITH DIFFERENTIAL/PLATELET - Abnormal; Notable for the following components:   WBC 12.0 (*)    RBC 4.17 (*)    Hemoglobin 12.5 (*)    HCT 37.5 (*)    Platelets 120 (*)    Neutro Abs 10.1 (*)    Abs Immature Granulocytes 0.11 (*)    All other components within normal limits  PROTIME-INR - Abnormal; Notable for the following components:   Prothrombin Time 16.7 (*)    INR 1.4 (*)    All other components within normal limits  URINALYSIS, ROUTINE W REFLEX MICROSCOPIC - Abnormal; Notable for the following components:   APPearance HAZY (*)    Hgb urine dipstick SMALL (*)    Protein, ur 30 (*)    Leukocytes,Ua SMALL (*)    Bacteria, UA MANY (*)    Crystals PRESENT (*)    All other components within normal limits  RESPIRATORY PANEL BY RT PCR (FLU A&B, COVID)  CULTURE, BLOOD (ROUTINE X 2)  CULTURE, BLOOD (ROUTINE X 2)  URINE CULTURE  LACTIC ACID, PLASMA  APTT  POC SARS CORONAVIRUS 2 AG -  ED    EKG  EKG Interpretation  Date/Time:  Saturday July 03 2019 13:36:43 EST Ventricular Rate:  83 PR Interval:  144 QRS Duration: 90 QT Interval:  342 QTC Calculation: 401 R Axis:   2 Text Interpretation: Normal sinus rhythm Normal ECG No significant change since last tracing Confirmed by Gwyneth Sprout ([id number]) on 07/03/2019 2:17:00 PM   Radiology DG Chest Port 1 View  Result  Date: 07/03/2019 CLINICAL DATA:  Weakness.  Hypertension. EXAM: PORTABLE CHEST 1 VIEW COMPARISON:  February 13, 2019 FINDINGS: There is left base scarring. Lungs elsewhere are clear. Heart is upper normal in size with pulmonary vascularity normal. No adenopathy. No bone lesions. IMPRESSION: Scarring left base, stable. Lungs elsewhere clear. Heart upper normal in size. No adenopathy. Electronically Signed   By: Bretta Bang III M.D.   On: 07/03/2019 13:52    Procedures Procedures (including critical care time)  Medications Ordered in ED Medications  lactated ringers bolus 1,000 mL (has no administration in time range)    ED Course  I have reviewed the triage vital signs and the nursing notes.  Pertinent labs & imaging results that were available during my care of the patient were reviewed by me and considered in my medical decision making (see chart for details).    MDM Rules/Calculators/A&P                      Elderly male presenting today with generalized weakness and foul-smelling urine that is been present since Tuesday.  Patient was found to be febrile at 101 prior to arrival and was given 1 g of Tylenol.  Here patient is awake and alert but does not give much history.  History was provided by his wife.  Patient does have a recent history in August of having sepsis with Klebsiella pneumonia and UTI.  As well as acute kidney injury.  Wife was concerned that this may be happening again as he was acting similar and did not want him to get sick like he did before.  Patient denies any chest pain, shortness of breath and oxygen saturation is 96% on room air.  He does have some edema in his lower extremities however per he and his wife this is chronic so lower suspicion for CHF at this time.  Concern for infectious etiology urinary source versus potential Covid.  He has no abdominal pain concerning for an acute intra-abdominal process and has no evidence of cellulitis.  He is awake and alert and  low suspicion for meningitis at this time.  Sepsis labs initiated and patient given IV fluids.  Currently stable at this time.  6:31 PM Patient's labs are consistent with leukocytosis of 12,000, new AKI with creatinine of 3.0 from his baseline of 1.4 and 2 UA with 21-50 white blood cells and many bacteria.  Chest x-ray without acute findings.  Based on patient's UTI and new AKI will admit for IV fluids and patient was given Rocephin.  Patient is a bit agitated at this time but is redirectable.  Patient feels warm and will give Tylenol.  Also patient takes trazodone at home and has not had his Sinemet today so we will give him some of his home medicines.  Final Clinical Impression(s) / ED Diagnoses Final diagnoses:  Dehydration  Lower urinary tract infectious disease  AKI (acute kidney injury) [facility])    Rx / DC Orders ED Discharge Orders    None       Gwyneth Sprout, MD 07/03/19 [phone]

## 2019-07-03 NOTE — ED Notes (Signed)
Patient waiting for CT scan before being transported upstairs

## 2019-07-04 ENCOUNTER — Observation Stay (HOSPITAL_COMMUNITY): Payer: Medicare Other

## 2019-07-04 ENCOUNTER — Encounter (HOSPITAL_COMMUNITY): Payer: Self-pay | Admitting: Internal Medicine

## 2019-07-04 DIAGNOSIS — Z888 Allergy status to other drugs, medicaments and biological substances status: Secondary | ICD-10-CM | POA: Diagnosis not present

## 2019-07-04 DIAGNOSIS — A419 Sepsis, unspecified organism: Secondary | ICD-10-CM | POA: Diagnosis not present

## 2019-07-04 DIAGNOSIS — Z66 Do not resuscitate: Secondary | ICD-10-CM | POA: Diagnosis present

## 2019-07-04 DIAGNOSIS — E860 Dehydration: Secondary | ICD-10-CM | POA: Diagnosis present

## 2019-07-04 DIAGNOSIS — A4159 Other Gram-negative sepsis: Secondary | ICD-10-CM | POA: Diagnosis present

## 2019-07-04 DIAGNOSIS — J302 Other seasonal allergic rhinitis: Secondary | ICD-10-CM | POA: Diagnosis present

## 2019-07-04 DIAGNOSIS — N400 Enlarged prostate without lower urinary tract symptoms: Secondary | ICD-10-CM | POA: Diagnosis present

## 2019-07-04 DIAGNOSIS — Z87891 Personal history of nicotine dependence: Secondary | ICD-10-CM | POA: Diagnosis not present

## 2019-07-04 DIAGNOSIS — B961 Klebsiella pneumoniae [K. pneumoniae] as the cause of diseases classified elsewhere: Secondary | ICD-10-CM | POA: Diagnosis present

## 2019-07-04 DIAGNOSIS — Z79899 Other long term (current) drug therapy: Secondary | ICD-10-CM | POA: Diagnosis not present

## 2019-07-04 DIAGNOSIS — N139 Obstructive and reflux uropathy, unspecified: Secondary | ICD-10-CM

## 2019-07-04 DIAGNOSIS — N136 Pyonephrosis: Secondary | ICD-10-CM | POA: Diagnosis present

## 2019-07-04 DIAGNOSIS — Z880 Allergy status to penicillin: Secondary | ICD-10-CM | POA: Diagnosis not present

## 2019-07-04 DIAGNOSIS — N132 Hydronephrosis with renal and ureteral calculous obstruction: Secondary | ICD-10-CM | POA: Diagnosis not present

## 2019-07-04 DIAGNOSIS — N200 Calculus of kidney: Secondary | ICD-10-CM

## 2019-07-04 DIAGNOSIS — F0280 Dementia in other diseases classified elsewhere without behavioral disturbance: Secondary | ICD-10-CM | POA: Diagnosis present

## 2019-07-04 DIAGNOSIS — G9341 Metabolic encephalopathy: Secondary | ICD-10-CM | POA: Diagnosis present

## 2019-07-04 DIAGNOSIS — E538 Deficiency of other specified B group vitamins: Secondary | ICD-10-CM | POA: Diagnosis present

## 2019-07-04 DIAGNOSIS — G20 Parkinson's disease: Secondary | ICD-10-CM | POA: Diagnosis present

## 2019-07-04 DIAGNOSIS — G4700 Insomnia, unspecified: Secondary | ICD-10-CM | POA: Diagnosis present

## 2019-07-04 DIAGNOSIS — Z1611 Resistance to penicillins: Secondary | ICD-10-CM | POA: Diagnosis present

## 2019-07-04 DIAGNOSIS — Z20822 Contact with and (suspected) exposure to covid-19: Secondary | ICD-10-CM | POA: Diagnosis present

## 2019-07-04 DIAGNOSIS — N183 Chronic kidney disease, stage 3 unspecified: Secondary | ICD-10-CM | POA: Diagnosis present

## 2019-07-04 DIAGNOSIS — E785 Hyperlipidemia, unspecified: Secondary | ICD-10-CM | POA: Diagnosis present

## 2019-07-04 DIAGNOSIS — I129 Hypertensive chronic kidney disease with stage 1 through stage 4 chronic kidney disease, or unspecified chronic kidney disease: Secondary | ICD-10-CM | POA: Diagnosis present

## 2019-07-04 DIAGNOSIS — N202 Calculus of kidney with calculus of ureter: Secondary | ICD-10-CM | POA: Diagnosis present

## 2019-07-04 DIAGNOSIS — R296 Repeated falls: Secondary | ICD-10-CM | POA: Diagnosis present

## 2019-07-04 DIAGNOSIS — N179 Acute kidney failure, unspecified: Secondary | ICD-10-CM | POA: Diagnosis present

## 2019-07-04 LAB — BLOOD CULTURE ID PANEL (REFLEXED)

## 2019-07-04 LAB — COMPREHENSIVE METABOLIC PANEL
ALT: 11 U/L (ref 0–44)
AST: 11 U/L — ABNORMAL LOW (ref 15–41)
Albumin: 2.7 g/dL — ABNORMAL LOW (ref 3.5–5.0)
Alkaline Phosphatase: 52 U/L (ref 38–126)
Anion gap: 7 (ref 5–15)
BUN: 44 mg/dL — ABNORMAL HIGH (ref 8–23)
CO2: 25 mmol/L (ref 22–32)
Calcium: 8.4 mg/dL — ABNORMAL LOW (ref 8.9–10.3)
Chloride: 104 mmol/L (ref 98–111)
Creatinine, Ser: 2.38 mg/dL — ABNORMAL HIGH (ref 0.61–1.24)
GFR calc Af Amer: 29 mL/min — ABNORMAL LOW (ref >60)
GFR calc non Af Amer: 25 mL/min — ABNORMAL LOW (ref >60)
Glucose, Bld: 116 mg/dL — ABNORMAL HIGH (ref 70–99)
Potassium: 3.6 mmol/L (ref 3.5–5.1)
Sodium: 136 mmol/L (ref 135–145)
Total Bilirubin: 1.3 mg/dL — ABNORMAL HIGH (ref 0.3–1.2)
Total Protein: 5.4 g/dL — ABNORMAL LOW (ref 6.5–8.1)

## 2019-07-04 LAB — CBC
HCT: 34.1 % — ABNORMAL LOW (ref 39.0–52.0)
Hemoglobin: 11.3 g/dL — ABNORMAL LOW (ref 13.0–17.0)
MCH: 29.4 pg (ref 26.0–34.0)
MCHC: 33.1 g/dL (ref 30.0–36.0)
MCV: 88.8 fL (ref 80.0–100.0)
Platelets: 120 10*3/uL — ABNORMAL LOW (ref 150–400)
RBC: 3.84 MIL/uL — ABNORMAL LOW (ref 4.22–5.81)
RDW: 12.6 % (ref 11.5–15.5)
WBC: 9.8 10*3/uL (ref 4.0–10.5)
nRBC: 0 % (ref 0.0–0.2)

## 2019-07-04 LAB — PHOSPHORUS: Phosphorus: 2.4 mg/dL — ABNORMAL LOW (ref 2.5–4.6)

## 2019-07-04 LAB — TSH: TSH: 1.545 u[IU]/mL (ref 0.350–4.500)

## 2019-07-04 LAB — MRSA PCR SCREENING: MRSA by PCR: NEGATIVE

## 2019-07-04 LAB — MAGNESIUM: Magnesium: 1.9 mg/dL (ref 1.7–2.4)

## 2019-07-04 MED ORDER — TRAZODONE HCL 100 MG PO TABS: 100.0000 mg | ORAL_TABLET | Freq: Every day | ORAL | Status: DC

## 2019-07-04 MED ORDER — SODIUM CHLORIDE 0.9 % IV SOLN
1.0000 g | INTRAVENOUS | Status: AC
  Administered 2019-07-04: 20:00:00 1 g via INTRAVENOUS
  Filled 2019-07-04: qty 1

## 2019-07-04 MED ORDER — ONDANSETRON HCL 4 MG/2ML IJ SOLN: 4.0000 mg | Freq: Four times a day (QID) | INTRAMUSCULAR | Status: DC | PRN

## 2019-07-04 MED ORDER — SODIUM CHLORIDE 0.9 % IV SOLN: INTRAVENOUS | Status: AC

## 2019-07-04 MED ORDER — SODIUM CHLORIDE 0.9 % IV SOLN
2.0000 g | INTRAVENOUS | Status: DC
  Administered 2019-07-06: 10:00:00 2 g via INTRAVENOUS
  Filled 2019-07-04 (×2): qty 20

## 2019-07-04 MED ORDER — ACETAMINOPHEN 325 MG PO TABS
650.0000 mg | ORAL_TABLET | Freq: Four times a day (QID) | ORAL | Status: DC | PRN
  Administered 2019-07-04: 06:00:00 650 mg via ORAL
  Filled 2019-07-04: qty 2

## 2019-07-04 MED ORDER — ACETAMINOPHEN 325 MG PO TABS
650.0000 mg | ORAL_TABLET | Freq: Four times a day (QID) | ORAL | Status: DC | PRN
  Administered 2019-07-04: 19:00:00 650 mg via ORAL
  Filled 2019-07-04: qty 2

## 2019-07-04 MED ORDER — DOXAZOSIN MESYLATE 2 MG PO TABS
4.0000 mg | ORAL_TABLET | Freq: Every day | ORAL | Status: DC
  Administered 2019-07-04 – 2019-07-06 (×3): 4 mg via ORAL
  Filled 2019-07-04 (×3): qty 2

## 2019-07-04 MED ORDER — ACETAMINOPHEN 650 MG RE SUPP: 650.0000 mg | Freq: Four times a day (QID) | RECTAL | Status: DC | PRN

## 2019-07-04 MED ORDER — ONDANSETRON HCL 4 MG PO TABS: 4.0000 mg | ORAL_TABLET | Freq: Four times a day (QID) | ORAL | Status: DC | PRN

## 2019-07-04 MED ORDER — CARBIDOPA-LEVODOPA 25-100 MG PO TABS
1.0000 | ORAL_TABLET | Freq: Three times a day (TID) | ORAL | Status: DC
  Administered 2019-07-04 – 2019-07-06 (×6): 1 via ORAL
  Filled 2019-07-04 (×6): qty 1

## 2019-07-04 MED ORDER — SODIUM CHLORIDE 0.9 % IV SOLN: INTRAVENOUS | Status: DC

## 2019-07-04 MED ORDER — SODIUM CHLORIDE 0.9 % IV BOLUS
1000.0000 mL | Freq: Once | INTRAVENOUS | Status: AC
  Administered 2019-07-04: 1000 mL via INTRAVENOUS

## 2019-07-04 NOTE — Progress Notes (Signed)
   Vital Signs MEWS/VS Documentation      07/03/2019 2311 07/03/2019 2316 07/04/2019 0032 07/04/2019 0454   MEWS Score:  0  0  0  2   MEWS Score Color:  Green  Green  Green  Yellow   Resp:  18  --  --  18   Pulse:  98  --  --  99   BP:  (!) 158/87  --  --  (!) 150/76   Temp:  98.5 F (36.9 C)  --  --  (!) 102 F (38.9 C)   O2 Device:  Room Air  --  --  Room Air   Level of Consciousness:  --  --  Alert  Alert     Pt alert x3 no acute change in his loc. Made X Blout NP aware of the change in his temp of 102, no orders received at the moment. I will continue document under the yellow mews flow sheet.      [name] 07/04/2019,5:37 AM

## 2019-07-04 NOTE — Progress Notes (Signed)
PROGRESS NOTE    [name]  KKX:[id number] DOB: 01-23-1941 DOA: 07/03/2019 PCP: [name], MD    Brief Narrative:  [name] is a 79 year old Caucasian male with past medical history remarkable for dementia, Parkinson's disease, HTN, HLD, frequent falls and history of previous UTIs who presented to the ED with confusion and lethargy.  Reports that he has had this strong smelling urine which is atypical symptom/sign for recurrent UTI.  He is also been having a difficult time ambulating with progressive weakness over the past 4 days, now requiring assistance with ambulation.  Upon EMS arrival, he was noted to be febrile up to 101.0 and was given Tylenol.  Per family patient have not had any cough no congestion or shortness of breath no nausea no vomiting no diarrhea has not been complaining about back pain abdominal pain his blood pressure has been running a bit low so his wife did not give him any blood pressure medications lately. Denies any known Covid contacts.  In the ED, noted temperature 102.0.  HR 114, RR 20, BP 100/54, with SPO2 94% on room air.  Sodium 139, potassium 3.9, chloride 105, CO2 26, glucose 154, BUN 48, creatinine 3.09.  WBC count 12.0, hemoglobin 12.5, platelets 120.  Influenza negative.  Covid-19/SARS-CoV-2 negative.  Urinalysis with small leukoesterase, negative nitrite, many bacteria, 21-50 WBCs.  Patient referred by ED physician for admission given sepsis and further evaluation and treatment.   Assessment & Plan:   Active Problems:   Essential hypertension   Dyslipidemia   Falls frequently   ARF (acute renal failure) (HCC)   Sepsis (HCC)   Dementia due to Parkinson's disease without behavioral disturbance (HCC)   CKD (chronic kidney disease), stage III   Constipation   Acute metabolic encephalopathy   Sepsis, present on admission Urinary tract infection Left hydronephrosis with obstructing stone Patient presenting from home with weakness,  lethargy over the past 4 days with foul-smelling urine.  History of recurrent UTIs.  On presentation he was noted to be septic with elevated heart rate, elevated respiratory rate, fever and endorgan damage with renal failure.  Lactic acid 0.9 with procalcitonin elevated 4.18.  Ultrasound renal with mild left hydronephrosis.  CT abdomen/pelvis with left-sided obstructive uropathy with 2 mm stone in the left UV junction with mild hydroutero nephrosis.  Urinalysis indicative of UTI.  Review of EMR notes recent Klebsiella pneumonia septicemia with resistance to ampicillin. Previous E. coli UTI with pan sensitivity.  --Urology following, appreciate assistance --WBC 12.0-->9.0 --Ceftriaxone 1 g IV every 24 hours --NS at 100 mL's per hour --Started on doxazosin 4 mg p.o. daily by urology --Continue n.p.o., for possible need of surgical intervention --Supportive care  Acute renal failure Creatinine 3.09 on admission.  Etiology likely secondary to prerenal azotemia from severe dehydration versus ATN from underlying sepsis/UTI.  Urine sodium 45, urine creatinine 222, Fina 0.5% consistent with prerenal. --Cr 3.09-->2.38 --Continue IV fluids with NS at 100 mL's per hour --Avoid nephrotoxins, renally dose all medications --Monitor BMP daily  Parkinson's dementia --Continue Sinemet 25-200 mg p.o. 3 times daily --Trazodone 100 mg p.o. nightly  Essential hypertension BP 126/66, well controlled --Continue to hold home amlodipine-benazepril 5-20 mg p.o. daily, also holding home furosemide 40 mg p.o. daily that he uses prn --Continue to monitor closely  Frequent falls: --PT/OT evaluation   DVT prophylaxis: SCDs, holding chemical DVT prophylaxis in anticipation of possible surgical need Code Status: DNR Family Communication: Updated spouse Disposition Plan: Remain inpatient, IV antibiotics,  if unable to pass stone, may need surgical intervention with stent placement   Consultants:   Urology - Dr.  Alvester MorinBell  Procedures:   None  Antimicrobials:   Ceftriaxone   Subjective: Patient seen and examined at bedside, resting comfortably.  Pleasantly confused.  No specific complaints at this time.  No family present at time of evaluation.  Denies headache, no chest pain, palpitations, no shortness of breath, no abdominal pain.  No acute concerns per nursing staff.  Objective: Vitals:   07/04/19 0454 07/04/19 0647 07/04/19 0919 07/04/19 1416  BP: (!) 150/76 126/66 (!) 113/57 137/73  Pulse: 99 81 71 81  Resp: 18 18 19 20   Temp: (!) 102 F (38.9 C) 98.4 F (36.9 C) 98.4 F (36.9 C) 99.6 F (37.6 C)  TempSrc: Oral Oral Oral Oral  SpO2: 100% 94% 95% 97%    Intake/Output Summary (Last 24 hours) at 07/04/2019 1516 Last data filed at 07/04/2019 0930 Gross per 24 hour  Intake 87.78 ml  Output 225 ml  Net -137.22 ml   There were no vitals filed for this visit.  Examination:  General exam: Appears calm and comfortable, pleasantly confused, chronically ill in appearance Respiratory system: Clear to auscultation. Respiratory effort normal.  Oxygenating well on room air Cardiovascular system: S1 & S2 heard, RRR. No JVD, murmurs, rubs, gallops or clicks. No pedal edema. Gastrointestinal system: Abdomen is nondistended, soft and nontender. No organomegaly or masses felt. Normal bowel sounds heard. Central nervous system: Alert, not oriented to person/place/situation/time. No focal neurological deficits. Extremities: Symmetric 5 x 5 power. Skin: No rashes, lesions or ulcers Psychiatry: Judgement and insight appear poor. Mood & affect appropriate.     Data Reviewed: I have personally reviewed following labs and imaging studies  CBC: Recent Labs  Lab 07/03/19 1350 07/04/19 0757  WBC 12.0* 9.8  NEUTROABS 10.1*  --   HGB 12.5* 11.3*  HCT 37.5* 34.1*  MCV 89.9 88.8  PLT 120* 120*   Basic Metabolic Panel: Recent Labs  Lab 07/03/19 1350 07/04/19 0757  NA 139 136  K 3.9 3.6  CL  105 104  CO2 26 25  GLUCOSE 154* 116*  BUN 48* 44*  CREATININE 3.09* 2.38*  CALCIUM 8.8* 8.4*  MG  --  1.9  PHOS  --  2.4*   GFR: CrCl cannot be calculated (Unknown ideal weight.). Liver Function Tests: Recent Labs  Lab 07/03/19 1350 07/04/19 0757  AST 13* 11*  ALT 11 11  ALKPHOS 56 52  BILITOT 1.7* 1.3*  PROT 6.1* 5.4*  ALBUMIN 3.3* 2.7*   No results for input(s): LIPASE, AMYLASE in the last 168 hours. No results for input(s): AMMONIA in the last 168 hours. Coagulation Profile: Recent Labs  Lab 07/03/19 1350  INR 1.4*   Cardiac Enzymes: No results for input(s): CKTOTAL, CKMB, CKMBINDEX, TROPONINI in the last 168 hours. BNP (last 3 results) No results for input(s): PROBNP in the last 8760 hours. HbA1C: No results for input(s): HGBA1C in the last 72 hours. CBG: No results for input(s): GLUCAP in the last 168 hours. Lipid Profile: No results for input(s): CHOL, HDL, LDLCALC, TRIG, CHOLHDL, LDLDIRECT in the last 72 hours. Thyroid Function Tests: Recent Labs    07/04/19 0757  TSH 1.545   Anemia Panel: No results for input(s): VITAMINB12, FOLATE, FERRITIN, TIBC, IRON, RETICCTPCT in the last 72 hours. Sepsis Labs: Recent Labs  Lab 07/03/19 1330 07/03/19 1943 07/03/19 2249  PROCALCITON  --  4.18  --   LATICACIDVEN 1.7  0.9 0.9    Recent Results (from the past 240 hour(s))  Blood Culture (routine x 2)     Status: None (Preliminary result)   Collection Time: 07/03/19  1:30 PM   Specimen: BLOOD  Result Value Ref Range Status   Specimen Description   Final    BLOOD RIGHT ANTECUBITAL Performed at [facility] [address] [place], [place] [address]    Special Requests   Final    BOTTLES DRAWN AEROBIC AND ANAEROBIC Blood Culture adequate volume Performed at [facility] [address], [place], [place] [address]    Culture   Final    NO GROWTH < 24 HOURS Performed at [facility] [address]  [place], [place] [address]    Report Status PENDING  Incomplete  Blood Culture (routine x 2)     Status: None (Preliminary result)   Collection Time: 07/03/19  2:22 PM   Specimen: BLOOD LEFT HAND  Result Value Ref Range Status   Specimen Description   Final    BLOOD LEFT HAND Performed at [facility] [address] [place], [place] [address]    Special Requests   Final    BOTTLES DRAWN AEROBIC AND ANAEROBIC Blood Culture results may not be optimal due to an inadequate volume of blood received in culture bottles Performed at [facility] [address], [place], [place] [address]    Culture   Final    NO GROWTH < 24 HOURS Performed at [facility] [address] [place],  [address]    Report Status PENDING  Incomplete  Respiratory Panel by RT PCR (Flu A&B, Covid) - Nasopharyngeal Swab     Status: None   Collection Time: 07/03/19  3:23 PM   Specimen: Nasopharyngeal Swab  Result Value Ref Range Status   SARS Coronavirus 2 by RT PCR NEGATIVE NEGATIVE Final    Comment: (NOTE) SARS-CoV-2 target nucleic acids are NOT DETECTED. The SARS-CoV-2 RNA is generally detectable in upper respiratoy specimens during the acute phase of infection. The lowest concentration of SARS-CoV-2 viral copies this assay can detect is 131 copies/mL. A negative result does not preclude SARS-Cov-2 infection and should not be used as the sole basis for treatment or other patient management decisions. A negative result may occur with  improper specimen collection/handling, submission of specimen other than nasopharyngeal swab, presence of viral mutation(s) within the areas targeted by this assay, and inadequate number of viral copies (<131 copies/mL). A negative result must be combined with clinical observations, patient history, and epidemiological information. The expected result is Negative. Fact Sheet for Patients:   PinkCheek.be Fact Sheet for Healthcare Providers:  GravelBags.it This test is not yet ap proved or cleared by the [place] FDA and  has been authorized for detection and/or diagnosis of SARS-CoV-2 by FDA under an Emergency Use Authorization (EUA). This EUA will remain  in effect (meaning this test can be used) for the duration of the COVID-19 declaration under Section 564(b)(1) of the Act, 21 U.S.C. section 360bbb-3(b)(1), unless the authorization is terminated or revoked sooner.    Influenza A by PCR NEGATIVE NEGATIVE Final   Influenza B by PCR NEGATIVE NEGATIVE Final    Comment: (NOTE) The Xpert Xpress SARS-CoV-2/FLU/RSV assay is intended as an aid in  the diagnosis of influenza from Nasopharyngeal swab specimens and  should not be used as a sole basis for treatment. Nasal washings and  aspirates are unacceptable  for Xpert Xpress SARS-CoV-2/FLU/RSV  testing. Fact Sheet for Patients: https://www.moore.com/ Fact Sheet for Healthcare Providers: https://www.young.biz/ This test is not yet approved or cleared by the [place] FDA and  has been authorized for detection and/or diagnosis of SARS-CoV-2 by  FDA under an Emergency Use Authorization (EUA). This EUA will remain  in effect (meaning this test can be used) for the duration of the  Covid-19 declaration under Section 564(b)(1) of the Act, 21  U.S.C. section 360bbb-3(b)(1), unless the authorization is  terminated or revoked. Performed at [facility], [address], [place], [place] [address]   Urine culture     Status: Abnormal (Preliminary result)   Collection Time: 07/03/19  4:18 PM   Specimen: In/Out Cath Urine  Result Value Ref Range Status   Specimen Description   Final    IN/OUT CATH URINE Performed at [facility], [address], [place], [place] [address]    Special  Requests   Final    NONE Performed at [facility], [address] [place], [place] [address]    Culture (A)  Final    >=100,000 COLONIES/mL GRAM NEGATIVE RODS IDENTIFICATION AND SUSCEPTIBILITIES TO FOLLOW Performed at [facility], [address], [place], [place] [address]    Report Status PENDING  Incomplete  MRSA PCR Screening     Status: None   Collection Time: 07/04/19  5:06 AM   Specimen: Nasal Mucosa; Nasopharyngeal  Result Value Ref Range Status   MRSA by PCR NEGATIVE NEGATIVE Final    Comment:        The GeneXpert MRSA Assay (FDA approved for NASAL specimens only), is one component of a comprehensive MRSA colonization surveillance program. It is not intended to diagnose MRSA infection nor to guide or monitor treatment for MRSA infections. Performed at [facility], [address] [place], [place] [address]          Radiology Studies: CT ABDOMEN PELVIS WO CONTRAST  Result Date: 07/04/2019 CLINICAL DATA:  Constipation or obstruction. EXAM: CT ABDOMEN AND PELVIS WITHOUT CONTRAST TECHNIQUE: Multidetector CT imaging of the abdomen and pelvis was performed following the standard protocol without IV contrast. COMPARISON:  None. FINDINGS: LOWER CHEST: Bibasilar atelectasis. HEPATOBILIARY: Normal hepatic contours. No intra- or extrahepatic biliary dilatation. Normal gallbladder. PANCREAS: Normal pancreas. No ductal dilatation or peripancreatic fluid collection. SPLEEN: Normal. ADRENALS/URINARY TRACT: The adrenal glands are normal. There is a stone within the distal left ureter, at the ureterovesical junction, measuring 2 mm, causing mild hydroureteronephrosis and mild perinephric stranding. The right kidney is normal. The urinary bladder is normal for degree of distention STOMACH/BOWEL: There is no hiatal hernia. Normal duodenal course and caliber. No small bowel dilatation or inflammation. No focal colonic abnormality. Normal  appendix. VASCULAR/LYMPHATIC: There is calcific atherosclerosis of the abdominal aorta. No abdominal or pelvic lymphadenopathy. REPRODUCTIVE: Normal prostate size with symmetric seminal vesicles. MUSCULOSKELETAL. Multilevel degenerative disc disease and facet arthrosis. No bony spinal canal stenosis. OTHER: None. IMPRESSION: 1. Left-sided obstructive uropathy with 2 mm stone at the left ureterovesical junction causing mild hydroureteronephrosis and perinephric stranding. 2.  Aortic atherosclerosis (ICD10-I70.0). Electronically Signed   By: Deatra Robinson M.D.   On: 07/04/2019 01:56   DG Abd 1 View  Result Date: 07/03/2019 CLINICAL DATA:  Constipation EXAM: ABDOMEN - 1 VIEW COMPARISON:  None. FINDINGS: Mild diffuse gaseous distention of bowel, likely ileus. No significant increase in stool burden. No organomegaly or free air. No acute bony abnormality.  IMPRESSION: Diffuse gaseous distention of bowel, likely ileus. Electronically Signed   By: Charlett Nose M.D.   On: 07/03/2019 20:17   US Renal  Result Date: 07/03/2019 CLINICAL DATA:  79 year old male with acute renal insufficiency. EXAM: RENAL / URINARY TRACT ULTRASOUND COMPLETE COMPARISON:  Renal ultrasound dated 02/13/2019. FINDINGS: Right Kidney: Renal measurements: 12.6 x 5.1 x 5.1 cm = volume: 172 mL. Mild increased echogenicity. No hydronephrosis or shadowing stone. Left Kidney: Renal measurements: 12.1 x 6.5 x 7.0 cm = volume: 287 mL. Mildly echogenic. There is mild left hydronephrosis. No shadowing stone. Bladder: The urinary bladder is partially distended and grossly unremarkable. Other: None. IMPRESSION: 1. Mild increased renal echogenicity may be related to chronic kidney disease. 2. Mild left hydronephrosis.  No shadowing stone. Electronically Signed   By: Elgie Collard M.D.   On: 07/03/2019 20:49   DG Chest Port 1 View  Result Date: 07/03/2019 CLINICAL DATA:  Weakness.  Hypertension. EXAM: PORTABLE CHEST 1 VIEW COMPARISON:  February 13, 2019  FINDINGS: There is left base scarring. Lungs elsewhere are clear. Heart is upper normal in size with pulmonary vascularity normal. No adenopathy. No bone lesions. IMPRESSION: Scarring left base, stable. Lungs elsewhere clear. Heart upper normal in size. No adenopathy. Electronically Signed   By: Bretta Bang III M.D.   On: 07/03/2019 13:52        Scheduled Meds: . carbidopa-levodopa  1 tablet Oral TID  . doxazosin  4 mg Oral Daily  . traZODone  100 mg Oral QHS   Continuous Infusions: . sodium chloride 100 mL/hr at 07/04/19 1144  . cefTRIAXone (ROCEPHIN)  IV       LOS: 0 days    Time spent: 38 minutes spent on chart review, discussion with nursing staff, consultants, updating family and interview/physical exam; more than 50% of that time was spent in counseling and/or coordination of care.    Alvira Philips [place], DO [facility] 07/04/2019, 3:16 PM

## 2019-07-04 NOTE — Consult Note (Addendum)
H&P Physician requesting consult: Leonie Man  Chief Complaint: Left ureteral calculus with urinary tract infection  History of Present Illness: 79 year old male with dementia, Parkinson's disease, frequent urinary tract infections presented with a 4-day history of foul-smelling urine, confusion, lethargy.  Had a self-reported temperature of 101 at home.  He arrived afebrile vital signs stable.  He was febrile overnight to 102 however this has now resolved with antibiotics and Tylenol.  He is currently afebrile with vital signs stable.  He is able to respond to commands and answer my questions.  He denies any current pain or discomfort.  Nursing has not reported a passed stone yet.  He currently has a condom catheter.  Urine is quite concentrated at this time.  He has 100 cc/h of fluids ordered but none were running this morning.  His urine was quite dark.  He is not on tamsulosin.  He is currently on ceftriaxone.  His creatinine was elevated to 3 upon admission and has improved to 2.4.  Leukocytosis has improved as well.  Urinalysis was positive for small leukocyte, negative nitrite.  He had many bacteria 0-5 RBC, 21-50 WBC.  Given his urinary tract infection, he underwent a CT scan of the abdomen and pelvis without contrast.  This revealed a 2 mm left ureterovesicular junction calculus with mild hydroureteronephrosis.  Patient currently has no complaints.  Past Medical History:  Diagnosis Date  . B12 deficiency 11/19/2018  . Benign prostatic hypertrophy   . Edema   . Hyperlipidemia   . Hypertension   . Insomnia   . Parasomnia 10/14/2011   PSG 12/15/11>>AHI 0.4, SpO2 low 89%, PLMI 16, somnoliloquay, nocturia   . Prediabetes 06/10/2012  . Seasonal allergies   . Sepsis ([place]) 01/2019  . UTI (lower urinary tract infection)    Past Surgical History:  Procedure Laterality Date  . FINGER SURGERY Right 04/19/2014   RIGHT INDEX & RING FINGER   FROM DOG BITE   . I & D EXTREMITY Right 04/20/2014    Procedure: IRRIGATION AND DEBRIDEMENT right index finger and right ring finger;  Surgeon: Roseanne Kaufman, MD;  Location: [facility];  Service: Orthopedics;  Laterality: Right;  . I & D EXTREMITY Right 04/21/2014   Procedure: IRRIGATION AND DEBRIDEMENT RIGHT HAND RING FINGER, INDEX FINGER AND Flexor Tenolysis and FDS Tenotomy;  Surgeon: Roseanne Kaufman, MD;  Location: [facility];  Service: Orthopedics;  Laterality: Right;    Home Medications:  Medications Prior to Admission  Medication Sig Dispense Refill Last Dose  . amLODipine-benazepril (LOTREL) 5-20 MG capsule Take 1 capsule by mouth daily.   07/02/2019 at Unknown time  . B Complex-C (B-COMPLEX WITH VITAMIN C) tablet Take 1 tablet by mouth daily.   07/02/2019 at Unknown time  . carbidopa-levodopa (SINEMET IR) 25-100 MG tablet Take 1 tablet three times a day with meals (Patient taking differently: Take 1 tablet by mouth 3 (three) times daily. with meals) 90 tablet 11 07/02/2019 at Unknown time  . Cholecalciferol (VITAMIN D) 1000 UNITS capsule Take 1 capsule (1,000 Units total) by mouth daily. 100 capsule 3 07/02/2019 at Unknown time  . cyanocobalamin (,VITAMIN B-12,) 1000 MCG/ML injection 1 ml sq for 5 days, then 1 ml weekly for 4 weeks, then 1 ml every 2 weeks (Patient taking differently: Inject 1,000 mcg into the muscle See admin instructions. 1 ml sq for 5 days, then 1 ml weekly for 4 weeks, then 1 ml every 2 weeks) 10 mL 6 Past Month at Unknown time  . furosemide (  LASIX) 40 MG tablet Take 1 tablet (40 mg total) by mouth daily as needed. (Patient taking differently: Take 40 mg by mouth daily as needed for fluid. ) 30 tablet 11 Past Month at Unknown time  . loratadine-pseudoephedrine (CLARITIN-D 24-HOUR) 10-240 MG 24 hr tablet Take 1 tablet by mouth daily as needed for allergies.   07/02/2019 at Unknown time  . potassium chloride (KLOR-CON) 8 MEQ tablet Take 1 tablet (8 mEq total) by mouth daily. 90 tablet 3 07/02/2019 at Unknown time  . traZODone (DESYREL) 50 MG  tablet TAKE 2 TABLETS BY MOUTH EVERY DAY AT BEDTIME (Patient taking differently: Take 100 mg by mouth at bedtime. ) 180 tablet 2 07/02/2019 at Unknown time  . acetaminophen (TYLENOL) 325 MG tablet Take 2 tablets (650 mg total) by mouth every 6 (six) hours as needed for mild pain (or Fever >/= 101). (Patient not taking: Reported on 07/03/2019) 20 tablet 0 Not Taking at Unknown time  . amLODipine (NORVASC) 5 MG tablet Take 0.5 tablets (2.5 mg total) by mouth daily. (Patient not taking: Reported on 07/03/2019) 90 tablet 3 Not Taking at Unknown time  . SYRINGE-NEEDLE, DISP, 3 ML (BD ECLIPSE SYRINGE) 25G X 1" 3 ML MISC Use sq as directed 50 each 3    Allergies:  Allergies  Allergen Reactions  . Amlodipine     Edema w/10 mg  . Atorvastatin     REACTION: leg cramps  . Penicillins Hives    Has patient had a PCN reaction causing immediate rash, facial/tongue/throat swelling, SOB or lightheadedness with hypotension: Yes Has patient had a PCN reaction causing severe rash involving mucus membranes or skin necrosis: No Has patient had a PCN reaction that required hospitalization No Has patient had a PCN reaction occurring within the last 10 years: No If all of the above answers are "NO", then may proceed with Cephalosporin use.   . Sulfonamide Derivatives Hives  . Telmisartan     ?fatigue  . Sulfa Antibiotics Other (See Comments) and Palpitations    Sweating, passed out    Family History  Problem Relation Age of Onset  . Diabetes Mother   . Tremor Father   . Diabetes Other    Social History:  reports that he has quit smoking. His smoking use included cigarettes and cigars. He has a 7.00 pack-year smoking history. He has never used smokeless tobacco. He reports previous alcohol use. He reports that he does not use drugs.  ROS: A complete review of systems was performed.  All systems are negative except for pertinent findings as noted. ROS   Physical Exam:  Vital signs in last 24 hours: Temp:   [98.3 F (36.8 C)-102 F (38.9 C)] 98.4 F (36.9 C) (01/10 0919) Pulse Rate:  [71-114] 71 (01/10 0919) Resp:  [14-20] 19 (01/10 0919) BP: (110-178)/(54-100) 113/57 (01/10 0919) SpO2:  [90 %-100 %] 95 % (01/10 0919) General:  Alert and answers appropriately, No acute distress HEENT: Normocephalic, atraumatic Neck: No JVD or lymphadenopathy Cardiovascular: Regular rate and rhythm Lungs: Regular rate and effort Abdomen: Soft, nontender, nondistended, no abdominal masses, there is no CVA tenderness Back: No CVA tenderness Extremities: No edema Neurologic: Grossly intact  Laboratory Data:  Results for orders placed or performed during the hospital encounter of 07/03/19 (from the past 24 hour(s))  Lactic acid, plasma     Status: None   Collection Time: 07/03/19  1:30 PM  Result Value Ref Range   Lactic Acid, Venous 1.7 0.5 - 1.9 mmol/L  Comprehensive metabolic panel     Status: Abnormal   Collection Time: 07/03/19  1:50 PM  Result Value Ref Range   Sodium 139 135 - 145 mmol/L   Potassium 3.9 3.5 - 5.1 mmol/L   Chloride 105 98 - 111 mmol/L   CO2 26 22 - 32 mmol/L   Glucose, Bld 154 (H) 70 - 99 mg/dL   BUN 48 (H) 8 - 23 mg/dL   Creatinine, Ser 3.09 (H) 0.61 - 1.24 mg/dL   Calcium 8.8 (L) 8.9 - 10.3 mg/dL   Total Protein 6.1 (L) 6.5 - 8.1 g/dL   Albumin 3.3 (L) 3.5 - 5.0 g/dL   AST 13 (L) 15 - 41 U/L   ALT 11 0 - 44 U/L   Alkaline Phosphatase 56 38 - 126 U/L   Total Bilirubin 1.7 (H) 0.3 - 1.2 mg/dL   GFR calc non Af Amer 18 (L) >60 mL/min   GFR calc Af Amer 21 (L) >60 mL/min   Anion gap 8 5 - 15  CBC WITH DIFFERENTIAL     Status: Abnormal   Collection Time: 07/03/19  1:50 PM  Result Value Ref Range   WBC 12.0 (H) 4.0 - 10.5 K/uL   RBC 4.17 (L) 4.22 - 5.81 MIL/uL   Hemoglobin 12.5 (L) 13.0 - 17.0 g/dL   HCT 37.5 (L) 39.0 - 52.0 %   MCV 89.9 80.0 - 100.0 fL   MCH 30.0 26.0 - 34.0 pg   MCHC 33.3 30.0 - 36.0 g/dL   RDW 12.5 11.5 - 15.5 %   Platelets 120 (L) 150 - 400 K/uL    nRBC 0.0 0.0 - 0.2 %   Neutrophils Relative % 85 %   Neutro Abs 10.1 (H) 1.7 - 7.7 K/uL   Lymphocytes Relative 6 %   Lymphs Abs 0.7 0.7 - 4.0 K/uL   Monocytes Relative 8 %   Monocytes Absolute 1.0 0.1 - 1.0 K/uL   Eosinophils Relative 0 %   Eosinophils Absolute 0.0 0.0 - 0.5 K/uL   Basophils Relative 0 %   Basophils Absolute 0.0 0.0 - 0.1 K/uL   Immature Granulocytes 1 %   Abs Immature Granulocytes 0.11 (H) 0.00 - 0.07 K/uL  APTT     Status: None   Collection Time: 07/03/19  1:50 PM  Result Value Ref Range   aPTT 33 24 - 36 seconds  Protime-INR     Status: Abnormal   Collection Time: 07/03/19  1:50 PM  Result Value Ref Range   Prothrombin Time 16.7 (H) 11.4 - 15.2 seconds   INR 1.4 (H) 0.8 - 1.2  POC SARS Coronavirus 2 Ag-ED - Nasal Swab (BD Veritor Kit)     Status: None   Collection Time: 07/03/19  2:20 PM  Result Value Ref Range   SARS Coronavirus 2 Ag NEGATIVE NEGATIVE  Respiratory Panel by RT PCR (Flu A&B, Covid) - Nasopharyngeal Swab     Status: None   Collection Time: 07/03/19  3:23 PM   Specimen: Nasopharyngeal Swab  Result Value Ref Range   SARS Coronavirus 2 by RT PCR NEGATIVE NEGATIVE   Influenza A by PCR NEGATIVE NEGATIVE   Influenza B by PCR NEGATIVE NEGATIVE  Urinalysis, Routine w reflex microscopic     Status: Abnormal   Collection Time: 07/03/19  4:18 PM  Result Value Ref Range   Color, Urine YELLOW YELLOW   APPearance HAZY (A) CLEAR   Specific Gravity, Urine 1.023 1.005 - 1.030   pH 5.0 5.0 -  8.0   Glucose, UA NEGATIVE NEGATIVE mg/dL   Hgb urine dipstick SMALL (A) NEGATIVE   Bilirubin Urine NEGATIVE NEGATIVE   Ketones, ur NEGATIVE NEGATIVE mg/dL   Protein, ur 30 (A) NEGATIVE mg/dL   Nitrite NEGATIVE NEGATIVE   Leukocytes,Ua SMALL (A) NEGATIVE   RBC / HPF 0-5 0 - 5 RBC/hpf   WBC, UA 21-50 0 - 5 WBC/hpf   Bacteria, UA MANY (A) NONE SEEN   Squamous Epithelial / LPF 0-5 0 - 5   Mucus PRESENT    Crystals PRESENT (A) NEGATIVE  Lactic acid, plasma      Status: None   Collection Time: 07/03/19  7:43 PM  Result Value Ref Range   Lactic Acid, Venous 0.9 0.5 - 1.9 mmol/L  Procalcitonin     Status: None   Collection Time: 07/03/19  7:43 PM  Result Value Ref Range   Procalcitonin 4.18 ng/mL  Creatinine, urine, random     Status: None   Collection Time: 07/03/19  7:43 PM  Result Value Ref Range   Creatinine, Urine 222.75 mg/dL  Sodium, urine, random     Status: None   Collection Time: 07/03/19  7:43 PM  Result Value Ref Range   Sodium, Ur 45 mmol/L  Lactic acid, plasma     Status: None   Collection Time: 07/03/19 10:49 PM  Result Value Ref Range   Lactic Acid, Venous 0.9 0.5 - 1.9 mmol/L  MRSA PCR Screening     Status: None   Collection Time: 07/04/19  5:06 AM   Specimen: Nasal Mucosa; Nasopharyngeal  Result Value Ref Range   MRSA by PCR NEGATIVE NEGATIVE  CBC     Status: Abnormal   Collection Time: 07/04/19  7:57 AM  Result Value Ref Range   WBC 9.8 4.0 - 10.5 K/uL   RBC 3.84 (L) 4.22 - 5.81 MIL/uL   Hemoglobin 11.3 (L) 13.0 - 17.0 g/dL   HCT 34.1 (L) 39.0 - 52.0 %   MCV 88.8 80.0 - 100.0 fL   MCH 29.4 26.0 - 34.0 pg   MCHC 33.1 30.0 - 36.0 g/dL   RDW 12.6 11.5 - 15.5 %   Platelets 120 (L) 150 - 400 K/uL   nRBC 0.0 0.0 - 0.2 %  Magnesium     Status: None   Collection Time: 07/04/19  7:57 AM  Result Value Ref Range   Magnesium 1.9 1.7 - 2.4 mg/dL  Phosphorus     Status: Abnormal   Collection Time: 07/04/19  7:57 AM  Result Value Ref Range   Phosphorus 2.4 (L) 2.5 - 4.6 mg/dL  TSH     Status: None   Collection Time: 07/04/19  7:57 AM  Result Value Ref Range   TSH 1.545 0.350 - 4.500 uIU/mL  Comprehensive metabolic panel     Status: Abnormal   Collection Time: 07/04/19  7:57 AM  Result Value Ref Range   Sodium 136 135 - 145 mmol/L   Potassium 3.6 3.5 - 5.1 mmol/L   Chloride 104 98 - 111 mmol/L   CO2 25 22 - 32 mmol/L   Glucose, Bld 116 (H) 70 - 99 mg/dL   BUN 44 (H) 8 - 23 mg/dL   Creatinine, Ser 2.38 (H) 0.61 -  1.24 mg/dL   Calcium 8.4 (L) 8.9 - 10.3 mg/dL   Total Protein 5.4 (L) 6.5 - 8.1 g/dL   Albumin 2.7 (L) 3.5 - 5.0 g/dL   AST 11 (L) 15 - 41 U/L   ALT  11 0 - 44 U/L   Alkaline Phosphatase 52 38 - 126 U/L   Total Bilirubin 1.3 (H) 0.3 - 1.2 mg/dL   GFR calc non Af Amer 25 (L) >60 mL/min   GFR calc Af Amer 29 (L) >60 mL/min   Anion gap 7 5 - 15   Recent Results (from the past 240 hour(s))  Respiratory Panel by RT PCR (Flu A&B, Covid) - Nasopharyngeal Swab     Status: None   Collection Time: 07/03/19  3:23 PM   Specimen: Nasopharyngeal Swab  Result Value Ref Range Status   SARS Coronavirus 2 by RT PCR NEGATIVE NEGATIVE Final    Comment: (NOTE) SARS-CoV-2 target nucleic acids are NOT DETECTED. The SARS-CoV-2 RNA is generally detectable in upper respiratoy specimens during the acute phase of infection. The lowest concentration of SARS-CoV-2 viral copies this assay can detect is 131 copies/mL. A negative result does not preclude SARS-Cov-2 infection and should not be used as the sole basis for treatment or other patient management decisions. A negative result may occur with  improper specimen collection/handling, submission of specimen other than nasopharyngeal swab, presence of viral mutation(s) within the areas targeted by this assay, and inadequate number of viral copies (<131 copies/mL). A negative result must be combined with clinical observations, patient history, and epidemiological information. The expected result is Negative. Fact Sheet for Patients:  PinkCheek.be Fact Sheet for Healthcare Providers:  GravelBags.it This test is not yet ap proved or cleared by the [place] FDA and  has been authorized for detection and/or diagnosis of SARS-CoV-2 by FDA under an Emergency Use Authorization (EUA). This EUA will remain  in effect (meaning this test can be used) for the duration of the COVID-19 declaration under Section  564(b)(1) of the Act, 21 U.S.C. section 360bbb-3(b)(1), unless the authorization is terminated or revoked sooner.    Influenza A by PCR NEGATIVE NEGATIVE Final   Influenza B by PCR NEGATIVE NEGATIVE Final    Comment: (NOTE) The Xpert Xpress SARS-CoV-2/FLU/RSV assay is intended as an aid in  the diagnosis of influenza from Nasopharyngeal swab specimens and  should not be used as a sole basis for treatment. Nasal washings and  aspirates are unacceptable for Xpert Xpress SARS-CoV-2/FLU/RSV  testing. Fact Sheet for Patients: PinkCheek.be Fact Sheet for Healthcare Providers: GravelBags.it This test is not yet approved or cleared by the [place] FDA and  has been authorized for detection and/or diagnosis of SARS-CoV-2 by  FDA under an Emergency Use Authorization (EUA). This EUA will remain  in effect (meaning this test can be used) for the duration of the  Covid-19 declaration under Section 564(b)(1) of the Act, 21  U.S.C. section 360bbb-3(b)(1), unless the authorization is  terminated or revoked. Performed at [facility], [place] [address] [place], [place] [address]   MRSA PCR Screening     Status: None   Collection Time: 07/04/19  5:06 AM   Specimen: Nasal Mucosa; Nasopharyngeal  Result Value Ref Range Status   MRSA by PCR NEGATIVE NEGATIVE Final    Comment:        The GeneXpert MRSA Assay (FDA approved for NASAL specimens only), is one component of a comprehensive MRSA colonization surveillance program. It is not intended to diagnose MRSA infection nor to guide or monitor treatment for MRSA infections. Performed at [facility], [place] [address], [place], [place] [address]    Creatinine: Recent Labs    07/03/19 1350 07/04/19 0757  CREATININE 3.09* 2.38*  CT scan personally reviewed and is detailed in history of present illness  Impression/Assessment:  Left ureteral  calculus Ureteral obstruction secondary to calculus Urinary tract infection  Plan:  I called the patient's wife.  I extensively discussed the options.  I discussed that typically with a urinary tract infection, fever, and stone the treatment is a ureteral stent placement.  The patient does have no complaints and his creatinine is improving and his fever curve has improved.  His leukocytosis has resolved as well.  I discussed this with the patient's wife.  Given these findings and the fact that the stone is very small in size, she would like to proceed with trial passage.  Nursing will continue to look for stone.  The patient's wife is coming up soon and if she changes her mind, she will notify me.  We will watch him closely for stone passage.  I will give him a bolus and start doxazosin today.  He is allergic to sulfa and therefore I will avoid Flomax.  Hopefully he will pass the stone in the next day or 2.  If he does not, or if he starts to clinically decline, he will need a stent.  I think observation is reasonable given that he has clinically improved on antibiotics and the stone has nearly passed and is very small in size.  Marton Redwood, III 07/04/2019, 9:57 AM

## 2019-07-04 NOTE — Progress Notes (Signed)
SLP Cancellation Note  Patient Details Name: [name] MRN: [id number] DOB: Apr 20, 1941   Cancelled treatment:        Spoke with RN.  Pt is NPO 2/2 renal stone, with possible for OR removal.  Will defer swallowing evaluation until pt is cleared for PO intake.   Kerrie Pleasure , MA, CCC-SLP Acute Rehabilitation Services Office: [phone]; Pager [phone]): [phone] 07/04/2019, 2:07 PM

## 2019-07-04 NOTE — H&P (View-Only) (Signed)
H&P Physician requesting consult: Leonie Man  Chief Complaint: Left ureteral calculus with urinary tract infection  History of Present Illness: 79 year old male with dementia, Parkinson's disease, frequent urinary tract infections presented with a 4-day history of foul-smelling urine, confusion, lethargy.  Had a self-reported temperature of 101 at home.  He arrived afebrile vital signs stable.  He was febrile overnight to 102 however this has now resolved with antibiotics and Tylenol.  He is currently afebrile with vital signs stable.  He is able to respond to commands and answer my questions.  He denies any current pain or discomfort.  Nursing has not reported a passed stone yet.  He currently has a condom catheter.  Urine is quite concentrated at this time.  He has 100 cc/h of fluids ordered but none were running this morning.  His urine was quite dark.  He is not on tamsulosin.  He is currently on ceftriaxone.  His creatinine was elevated to 3 upon admission and has improved to 2.4.  Leukocytosis has improved as well.  Urinalysis was positive for small leukocyte, negative nitrite.  He had many bacteria 0-5 RBC, 21-50 WBC.  Given his urinary tract infection, he underwent a CT scan of the abdomen and pelvis without contrast.  This revealed a 2 mm left ureterovesicular junction calculus with mild hydroureteronephrosis.  Patient currently has no complaints.  Past Medical History:  Diagnosis Date  . B12 deficiency 11/19/2018  . Benign prostatic hypertrophy   . Edema   . Hyperlipidemia   . Hypertension   . Insomnia   . Parasomnia 10/14/2011   PSG 12/15/11>>AHI 0.4, SpO2 low 89%, PLMI 16, somnoliloquay, nocturia   . Prediabetes 06/10/2012  . Seasonal allergies   . Sepsis ([place]) 01/2019  . UTI (lower urinary tract infection)    Past Surgical History:  Procedure Laterality Date  . FINGER SURGERY Right 04/19/2014   RIGHT INDEX & RING FINGER   FROM DOG BITE   . I & D EXTREMITY Right 04/20/2014    Procedure: IRRIGATION AND DEBRIDEMENT right index finger and right ring finger;  Surgeon: Roseanne Kaufman, MD;  Location: [facility];  Service: Orthopedics;  Laterality: Right;  . I & D EXTREMITY Right 04/21/2014   Procedure: IRRIGATION AND DEBRIDEMENT RIGHT HAND RING FINGER, INDEX FINGER AND Flexor Tenolysis and FDS Tenotomy;  Surgeon: Roseanne Kaufman, MD;  Location: [facility];  Service: Orthopedics;  Laterality: Right;    Home Medications:  Medications Prior to Admission  Medication Sig Dispense Refill Last Dose  . amLODipine-benazepril (LOTREL) 5-20 MG capsule Take 1 capsule by mouth daily.   07/02/2019 at Unknown time  . B Complex-C (B-COMPLEX WITH VITAMIN C) tablet Take 1 tablet by mouth daily.   07/02/2019 at Unknown time  . carbidopa-levodopa (SINEMET IR) 25-100 MG tablet Take 1 tablet three times a day with meals (Patient taking differently: Take 1 tablet by mouth 3 (three) times daily. with meals) 90 tablet 11 07/02/2019 at Unknown time  . Cholecalciferol (VITAMIN D) 1000 UNITS capsule Take 1 capsule (1,000 Units total) by mouth daily. 100 capsule 3 07/02/2019 at Unknown time  . cyanocobalamin (,VITAMIN B-12,) 1000 MCG/ML injection 1 ml sq for 5 days, then 1 ml weekly for 4 weeks, then 1 ml every 2 weeks (Patient taking differently: Inject 1,000 mcg into the muscle See admin instructions. 1 ml sq for 5 days, then 1 ml weekly for 4 weeks, then 1 ml every 2 weeks) 10 mL 6 Past Month at Unknown time  . furosemide (  LASIX) 40 MG tablet Take 1 tablet (40 mg total) by mouth daily as needed. (Patient taking differently: Take 40 mg by mouth daily as needed for fluid. ) 30 tablet 11 Past Month at Unknown time  . loratadine-pseudoephedrine (CLARITIN-D 24-HOUR) 10-240 MG 24 hr tablet Take 1 tablet by mouth daily as needed for allergies.   07/02/2019 at Unknown time  . potassium chloride (KLOR-CON) 8 MEQ tablet Take 1 tablet (8 mEq total) by mouth daily. 90 tablet 3 07/02/2019 at Unknown time  . traZODone (DESYREL) 50 MG  tablet TAKE 2 TABLETS BY MOUTH EVERY DAY AT BEDTIME (Patient taking differently: Take 100 mg by mouth at bedtime. ) 180 tablet 2 07/02/2019 at Unknown time  . acetaminophen (TYLENOL) 325 MG tablet Take 2 tablets (650 mg total) by mouth every 6 (six) hours as needed for mild pain (or Fever >/= 101). (Patient not taking: Reported on 07/03/2019) 20 tablet 0 Not Taking at Unknown time  . amLODipine (NORVASC) 5 MG tablet Take 0.5 tablets (2.5 mg total) by mouth daily. (Patient not taking: Reported on 07/03/2019) 90 tablet 3 Not Taking at Unknown time  . SYRINGE-NEEDLE, DISP, 3 ML (BD ECLIPSE SYRINGE) 25G X 1" 3 ML MISC Use sq as directed 50 each 3    Allergies:  Allergies  Allergen Reactions  . Amlodipine     Edema w/10 mg  . Atorvastatin     REACTION: leg cramps  . Penicillins Hives    Has patient had a PCN reaction causing immediate rash, facial/tongue/throat swelling, SOB or lightheadedness with hypotension: Yes Has patient had a PCN reaction causing severe rash involving mucus membranes or skin necrosis: No Has patient had a PCN reaction that required hospitalization No Has patient had a PCN reaction occurring within the last 10 years: No If all of the above answers are "NO", then may proceed with Cephalosporin use.   . Sulfonamide Derivatives Hives  . Telmisartan     ?fatigue  . Sulfa Antibiotics Other (See Comments) and Palpitations    Sweating, passed out    Family History  Problem Relation Age of Onset  . Diabetes Mother   . Tremor Father   . Diabetes Other    Social History:  reports that he has quit smoking. His smoking use included cigarettes and cigars. He has a 7.00 pack-year smoking history. He has never used smokeless tobacco. He reports previous alcohol use. He reports that he does not use drugs.  ROS: A complete review of systems was performed.  All systems are negative except for pertinent findings as noted. ROS   Physical Exam:  Vital signs in last 24 hours: Temp:   [98.3 F (36.8 C)-102 F (38.9 C)] 98.4 F (36.9 C) (01/10 0919) Pulse Rate:  [71-114] 71 (01/10 0919) Resp:  [14-20] 19 (01/10 0919) BP: (110-178)/(54-100) 113/57 (01/10 0919) SpO2:  [90 %-100 %] 95 % (01/10 0919) General:  Alert and answers appropriately, No acute distress HEENT: Normocephalic, atraumatic Neck: No JVD or lymphadenopathy Cardiovascular: Regular rate and rhythm Lungs: Regular rate and effort Abdomen: Soft, nontender, nondistended, no abdominal masses, there is no CVA tenderness Back: No CVA tenderness Extremities: No edema Neurologic: Grossly intact  Laboratory Data:  Results for orders placed or performed during the hospital encounter of 07/03/19 (from the past 24 hour(s))  Lactic acid, plasma     Status: None   Collection Time: 07/03/19  1:30 PM  Result Value Ref Range   Lactic Acid, Venous 1.7 0.5 - 1.9 mmol/L  Comprehensive metabolic panel     Status: Abnormal   Collection Time: 07/03/19  1:50 PM  Result Value Ref Range   Sodium 139 135 - 145 mmol/L   Potassium 3.9 3.5 - 5.1 mmol/L   Chloride 105 98 - 111 mmol/L   CO2 26 22 - 32 mmol/L   Glucose, Bld 154 (H) 70 - 99 mg/dL   BUN 48 (H) 8 - 23 mg/dL   Creatinine, Ser 3.09 (H) 0.61 - 1.24 mg/dL   Calcium 8.8 (L) 8.9 - 10.3 mg/dL   Total Protein 6.1 (L) 6.5 - 8.1 g/dL   Albumin 3.3 (L) 3.5 - 5.0 g/dL   AST 13 (L) 15 - 41 U/L   ALT 11 0 - 44 U/L   Alkaline Phosphatase 56 38 - 126 U/L   Total Bilirubin 1.7 (H) 0.3 - 1.2 mg/dL   GFR calc non Af Amer 18 (L) >60 mL/min   GFR calc Af Amer 21 (L) >60 mL/min   Anion gap 8 5 - 15  CBC WITH DIFFERENTIAL     Status: Abnormal   Collection Time: 07/03/19  1:50 PM  Result Value Ref Range   WBC 12.0 (H) 4.0 - 10.5 K/uL   RBC 4.17 (L) 4.22 - 5.81 MIL/uL   Hemoglobin 12.5 (L) 13.0 - 17.0 g/dL   HCT 37.5 (L) 39.0 - 52.0 %   MCV 89.9 80.0 - 100.0 fL   MCH 30.0 26.0 - 34.0 pg   MCHC 33.3 30.0 - 36.0 g/dL   RDW 12.5 11.5 - 15.5 %   Platelets 120 (L) 150 - 400 K/uL    nRBC 0.0 0.0 - 0.2 %   Neutrophils Relative % 85 %   Neutro Abs 10.1 (H) 1.7 - 7.7 K/uL   Lymphocytes Relative 6 %   Lymphs Abs 0.7 0.7 - 4.0 K/uL   Monocytes Relative 8 %   Monocytes Absolute 1.0 0.1 - 1.0 K/uL   Eosinophils Relative 0 %   Eosinophils Absolute 0.0 0.0 - 0.5 K/uL   Basophils Relative 0 %   Basophils Absolute 0.0 0.0 - 0.1 K/uL   Immature Granulocytes 1 %   Abs Immature Granulocytes 0.11 (H) 0.00 - 0.07 K/uL  APTT     Status: None   Collection Time: 07/03/19  1:50 PM  Result Value Ref Range   aPTT 33 24 - 36 seconds  Protime-INR     Status: Abnormal   Collection Time: 07/03/19  1:50 PM  Result Value Ref Range   Prothrombin Time 16.7 (H) 11.4 - 15.2 seconds   INR 1.4 (H) 0.8 - 1.2  POC SARS Coronavirus 2 Ag-ED - Nasal Swab (BD Veritor Kit)     Status: None   Collection Time: 07/03/19  2:20 PM  Result Value Ref Range   SARS Coronavirus 2 Ag NEGATIVE NEGATIVE  Respiratory Panel by RT PCR (Flu A&B, Covid) - Nasopharyngeal Swab     Status: None   Collection Time: 07/03/19  3:23 PM   Specimen: Nasopharyngeal Swab  Result Value Ref Range   SARS Coronavirus 2 by RT PCR NEGATIVE NEGATIVE   Influenza A by PCR NEGATIVE NEGATIVE   Influenza B by PCR NEGATIVE NEGATIVE  Urinalysis, Routine w reflex microscopic     Status: Abnormal   Collection Time: 07/03/19  4:18 PM  Result Value Ref Range   Color, Urine YELLOW YELLOW   APPearance HAZY (A) CLEAR   Specific Gravity, Urine 1.023 1.005 - 1.030   pH 5.0 5.0 -  8.0   Glucose, UA NEGATIVE NEGATIVE mg/dL   Hgb urine dipstick SMALL (A) NEGATIVE   Bilirubin Urine NEGATIVE NEGATIVE   Ketones, ur NEGATIVE NEGATIVE mg/dL   Protein, ur 30 (A) NEGATIVE mg/dL   Nitrite NEGATIVE NEGATIVE   Leukocytes,Ua SMALL (A) NEGATIVE   RBC / HPF 0-5 0 - 5 RBC/hpf   WBC, UA 21-50 0 - 5 WBC/hpf   Bacteria, UA MANY (A) NONE SEEN   Squamous Epithelial / LPF 0-5 0 - 5   Mucus PRESENT    Crystals PRESENT (A) NEGATIVE  Lactic acid, plasma      Status: None   Collection Time: 07/03/19  7:43 PM  Result Value Ref Range   Lactic Acid, Venous 0.9 0.5 - 1.9 mmol/L  Procalcitonin     Status: None   Collection Time: 07/03/19  7:43 PM  Result Value Ref Range   Procalcitonin 4.18 ng/mL  Creatinine, urine, random     Status: None   Collection Time: 07/03/19  7:43 PM  Result Value Ref Range   Creatinine, Urine 222.75 mg/dL  Sodium, urine, random     Status: None   Collection Time: 07/03/19  7:43 PM  Result Value Ref Range   Sodium, Ur 45 mmol/L  Lactic acid, plasma     Status: None   Collection Time: 07/03/19 10:49 PM  Result Value Ref Range   Lactic Acid, Venous 0.9 0.5 - 1.9 mmol/L  MRSA PCR Screening     Status: None   Collection Time: 07/04/19  5:06 AM   Specimen: Nasal Mucosa; Nasopharyngeal  Result Value Ref Range   MRSA by PCR NEGATIVE NEGATIVE  CBC     Status: Abnormal   Collection Time: 07/04/19  7:57 AM  Result Value Ref Range   WBC 9.8 4.0 - 10.5 K/uL   RBC 3.84 (L) 4.22 - 5.81 MIL/uL   Hemoglobin 11.3 (L) 13.0 - 17.0 g/dL   HCT 34.1 (L) 39.0 - 52.0 %   MCV 88.8 80.0 - 100.0 fL   MCH 29.4 26.0 - 34.0 pg   MCHC 33.1 30.0 - 36.0 g/dL   RDW 12.6 11.5 - 15.5 %   Platelets 120 (L) 150 - 400 K/uL   nRBC 0.0 0.0 - 0.2 %  Magnesium     Status: None   Collection Time: 07/04/19  7:57 AM  Result Value Ref Range   Magnesium 1.9 1.7 - 2.4 mg/dL  Phosphorus     Status: Abnormal   Collection Time: 07/04/19  7:57 AM  Result Value Ref Range   Phosphorus 2.4 (L) 2.5 - 4.6 mg/dL  TSH     Status: None   Collection Time: 07/04/19  7:57 AM  Result Value Ref Range   TSH 1.545 0.350 - 4.500 uIU/mL  Comprehensive metabolic panel     Status: Abnormal   Collection Time: 07/04/19  7:57 AM  Result Value Ref Range   Sodium 136 135 - 145 mmol/L   Potassium 3.6 3.5 - 5.1 mmol/L   Chloride 104 98 - 111 mmol/L   CO2 25 22 - 32 mmol/L   Glucose, Bld 116 (H) 70 - 99 mg/dL   BUN 44 (H) 8 - 23 mg/dL   Creatinine, Ser 2.38 (H) 0.61 -  1.24 mg/dL   Calcium 8.4 (L) 8.9 - 10.3 mg/dL   Total Protein 5.4 (L) 6.5 - 8.1 g/dL   Albumin 2.7 (L) 3.5 - 5.0 g/dL   AST 11 (L) 15 - 41 U/L   ALT  11 0 - 44 U/L   Alkaline Phosphatase 52 38 - 126 U/L   Total Bilirubin 1.3 (H) 0.3 - 1.2 mg/dL   GFR calc non Af Amer 25 (L) >60 mL/min   GFR calc Af Amer 29 (L) >60 mL/min   Anion gap 7 5 - 15   Recent Results (from the past 240 hour(s))  Respiratory Panel by RT PCR (Flu A&B, Covid) - Nasopharyngeal Swab     Status: None   Collection Time: 07/03/19  3:23 PM   Specimen: Nasopharyngeal Swab  Result Value Ref Range Status   SARS Coronavirus 2 by RT PCR NEGATIVE NEGATIVE Final    Comment: (NOTE) SARS-CoV-2 target nucleic acids are NOT DETECTED. The SARS-CoV-2 RNA is generally detectable in upper respiratoy specimens during the acute phase of infection. The lowest concentration of SARS-CoV-2 viral copies this assay can detect is 131 copies/mL. A negative result does not preclude SARS-Cov-2 infection and should not be used as the sole basis for treatment or other patient management decisions. A negative result may occur with  improper specimen collection/handling, submission of specimen other than nasopharyngeal swab, presence of viral mutation(s) within the areas targeted by this assay, and inadequate number of viral copies (<131 copies/mL). A negative result must be combined with clinical observations, patient history, and epidemiological information. The expected result is Negative. Fact Sheet for Patients:  PinkCheek.be Fact Sheet for Healthcare Providers:  GravelBags.it This test is not yet ap proved or cleared by the [place] FDA and  has been authorized for detection and/or diagnosis of SARS-CoV-2 by FDA under an Emergency Use Authorization (EUA). This EUA will remain  in effect (meaning this test can be used) for the duration of the COVID-19 declaration under Section  564(b)(1) of the Act, 21 U.S.C. section 360bbb-3(b)(1), unless the authorization is terminated or revoked sooner.    Influenza A by PCR NEGATIVE NEGATIVE Final   Influenza B by PCR NEGATIVE NEGATIVE Final    Comment: (NOTE) The Xpert Xpress SARS-CoV-2/FLU/RSV assay is intended as an aid in  the diagnosis of influenza from Nasopharyngeal swab specimens and  should not be used as a sole basis for treatment. Nasal washings and  aspirates are unacceptable for Xpert Xpress SARS-CoV-2/FLU/RSV  testing. Fact Sheet for Patients: PinkCheek.be Fact Sheet for Healthcare Providers: GravelBags.it This test is not yet approved or cleared by the [place] FDA and  has been authorized for detection and/or diagnosis of SARS-CoV-2 by  FDA under an Emergency Use Authorization (EUA). This EUA will remain  in effect (meaning this test can be used) for the duration of the  Covid-19 declaration under Section 564(b)(1) of the Act, 21  U.S.C. section 360bbb-3(b)(1), unless the authorization is  terminated or revoked. Performed at [facility], [place] [address], [place], [place] [address]   MRSA PCR Screening     Status: None   Collection Time: 07/04/19  5:06 AM   Specimen: Nasal Mucosa; Nasopharyngeal  Result Value Ref Range Status   MRSA by PCR NEGATIVE NEGATIVE Final    Comment:        The GeneXpert MRSA Assay (FDA approved for NASAL specimens only), is one component of a comprehensive MRSA colonization surveillance program. It is not intended to diagnose MRSA infection nor to guide or monitor treatment for MRSA infections. Performed at [facility], [place] [address] [place], [place] [address]    Creatinine: Recent Labs    07/03/19 1350 07/04/19 0757  CREATININE 3.09* 2.38*  CT scan personally reviewed and is detailed in history of present illness  Impression/Assessment:  Left ureteral  calculus Ureteral obstruction secondary to calculus Urinary tract infection  Plan:  I called the patient's wife.  I extensively discussed the options.  I discussed that typically with a urinary tract infection, fever, and stone the treatment is a ureteral stent placement.  The patient does have no complaints and his creatinine is improving and his fever curve has improved.  His leukocytosis has resolved as well.  I discussed this with the patient's wife.  Given these findings and the fact that the stone is very small in size, she would like to proceed with trial passage.  Nursing will continue to look for stone.  The patient's wife is coming up soon and if she changes her mind, she will notify me.  We will watch him closely for stone passage.  I will give him a bolus and start doxazosin today.  He is allergic to sulfa and therefore I will avoid Flomax.  Hopefully he will pass the stone in the next day or 2.  If he does not, or if he starts to clinically decline, he will need a stent.  I think observation is reasonable given that he has clinically improved on antibiotics and the stone has nearly passed and is very small in size.  Marton Redwood, III 07/04/2019, 9:57 AM

## 2019-07-04 NOTE — Progress Notes (Signed)
PHARMACY - PHYSICIAN COMMUNICATION CRITICAL VALUE ALERT - BLOOD CULTURE IDENTIFICATION (BCID)  [name] is an 79 y.o. male who presented to [facility] on 07/03/2019 with a chief complaint of weakness.  Assessment:  Klebsiella pneumoniae  (blood culture in addition to previously known urine culture)  Name of physician (or Provider) Contacted: X. Blound, FNP  Current antibiotics: Ceftriaxone 1gm IV q24h  Changes to prescribed antibiotics recommended:  Increase Ceftriaxone to 2gm IV q24h  Results for orders placed or performed during the hospital encounter of 07/03/19  Blood Culture ID Panel (Reflexed) (Collected: 07/03/2019  2:22 PM)  Result Value Ref Range   Enterococcus species NOT DETECTED NOT DETECTED   Listeria monocytogenes NOT DETECTED NOT DETECTED   Staphylococcus species NOT DETECTED NOT DETECTED   Staphylococcus aureus (BCID) NOT DETECTED NOT DETECTED   Streptococcus species NOT DETECTED NOT DETECTED   Streptococcus agalactiae NOT DETECTED NOT DETECTED   Streptococcus pneumoniae NOT DETECTED NOT DETECTED   Streptococcus pyogenes NOT DETECTED NOT DETECTED   Acinetobacter baumannii NOT DETECTED NOT DETECTED   Enterobacteriaceae species DETECTED (A) NOT DETECTED   Enterobacter cloacae complex NOT DETECTED NOT DETECTED   Escherichia coli NOT DETECTED NOT DETECTED   Klebsiella oxytoca NOT DETECTED NOT DETECTED   Klebsiella pneumoniae DETECTED (A) NOT DETECTED   Proteus species NOT DETECTED NOT DETECTED   Serratia marcescens NOT DETECTED NOT DETECTED   Carbapenem resistance NOT DETECTED NOT DETECTED   Haemophilus influenzae NOT DETECTED NOT DETECTED   Neisseria meningitidis NOT DETECTED NOT DETECTED   Pseudomonas aeruginosa NOT DETECTED NOT DETECTED   Candida albicans NOT DETECTED NOT DETECTED   Candida glabrata NOT DETECTED NOT DETECTED   Candida krusei NOT DETECTED NOT DETECTED   Candida parapsilosis NOT DETECTED NOT DETECTED   Candida tropicalis NOT DETECTED NOT  DETECTED    Maryellen Pile, PharmD 07/04/2019  7:23 PM

## 2019-07-04 NOTE — Progress Notes (Signed)
PRN Tylenol 650 mg given. Report given to Lillia Abed RN who will resume care.

## 2019-07-05 ENCOUNTER — Inpatient Hospital Stay (HOSPITAL_COMMUNITY): Payer: Medicare Other | Admitting: Certified Registered Nurse Anesthetist

## 2019-07-05 ENCOUNTER — Inpatient Hospital Stay (HOSPITAL_COMMUNITY): Payer: Medicare Other

## 2019-07-05 ENCOUNTER — Encounter (HOSPITAL_COMMUNITY)
Admission: EM | Disposition: A | Discharge status: Home Health | Payer: Self-pay | Source: Home / Self Care | Attending: Internal Medicine

## 2019-07-05 ENCOUNTER — Encounter (HOSPITAL_COMMUNITY): Payer: Self-pay | Admitting: Internal Medicine

## 2019-07-05 DIAGNOSIS — A414 Sepsis due to anaerobes: Secondary | ICD-10-CM

## 2019-07-05 DIAGNOSIS — A4150 Gram-negative sepsis, unspecified: Secondary | ICD-10-CM

## 2019-07-05 HISTORY — PX: CYSTOSCOPY/URETEROSCOPY/HOLMIUM LASER/STENT PLACEMENT: SHX6546

## 2019-07-05 LAB — BASIC METABOLIC PANEL
Anion gap: 13 (ref 5–15)
BUN: 37 mg/dL — ABNORMAL HIGH (ref 8–23)
CO2: 23 mmol/L (ref 22–32)
Calcium: 8.1 mg/dL — ABNORMAL LOW (ref 8.9–10.3)
Chloride: 104 mmol/L (ref 98–111)
Creatinine, Ser: 2.06 mg/dL — ABNORMAL HIGH (ref 0.61–1.24)
GFR calc Af Amer: 34 mL/min — ABNORMAL LOW (ref >60)
GFR calc non Af Amer: 30 mL/min — ABNORMAL LOW (ref >60)
Glucose, Bld: 103 mg/dL — ABNORMAL HIGH (ref 70–99)
Potassium: 3.5 mmol/L (ref 3.5–5.1)
Sodium: 140 mmol/L (ref 135–145)

## 2019-07-05 LAB — URINE CULTURE: Culture: >=100000 — AB

## 2019-07-05 LAB — CBC
HCT: 37.9 % — ABNORMAL LOW (ref 39.0–52.0)
Hemoglobin: 12.2 g/dL — ABNORMAL LOW (ref 13.0–17.0)
MCH: 29.3 pg (ref 26.0–34.0)
MCHC: 32.2 g/dL (ref 30.0–36.0)
MCV: 91.1 fL (ref 80.0–100.0)
Platelets: 131 10*3/uL — ABNORMAL LOW (ref 150–400)
RBC: 4.16 MIL/uL — ABNORMAL LOW (ref 4.22–5.81)
RDW: 12.5 % (ref 11.5–15.5)
WBC: 7 10*3/uL (ref 4.0–10.5)
nRBC: 0 % (ref 0.0–0.2)

## 2019-07-05 LAB — SURGICAL PCR SCREEN
MRSA, PCR: NEGATIVE
Staphylococcus aureus: NEGATIVE

## 2019-07-05 SURGERY — CYSTOSCOPY/URETEROSCOPY/HOLMIUM LASER/STENT PLACEMENT
Anesthesia: General | Laterality: Left

## 2019-07-05 MED ORDER — LIDOCAINE 2% (20 MG/ML) 5 ML SYRINGE
INTRAMUSCULAR | Status: DC | PRN
  Administered 2019-07-05: 50 mg via INTRAVENOUS

## 2019-07-05 MED ORDER — FENTANYL CITRATE (PF) 100 MCG/2ML IJ SOLN
INTRAMUSCULAR | Status: AC
  Filled 2019-07-05: qty 2

## 2019-07-05 MED ORDER — DEXAMETHASONE SODIUM PHOSPHATE 10 MG/ML IJ SOLN
INTRAMUSCULAR | Status: DC | PRN
  Administered 2019-07-05: 5 mg via INTRAVENOUS

## 2019-07-05 MED ORDER — OXYCODONE HCL 5 MG/5ML PO SOLN: 5.0000 mg | Freq: Once | ORAL | Status: DC | PRN

## 2019-07-05 MED ORDER — LACTATED RINGERS IV SOLN: INTRAVENOUS | Status: DC | PRN

## 2019-07-05 MED ORDER — IOHEXOL 300 MG/ML  SOLN
INTRAMUSCULAR | Status: DC | PRN
  Administered 2019-07-05: 20 mL via URETHRAL

## 2019-07-05 MED ORDER — ONDANSETRON HCL 4 MG/2ML IJ SOLN: 4.0000 mg | Freq: Once | INTRAMUSCULAR | Status: DC | PRN

## 2019-07-05 MED ORDER — FENTANYL CITRATE (PF) 100 MCG/2ML IJ SOLN: 25.0000 ug | INTRAMUSCULAR | Status: DC | PRN

## 2019-07-05 MED ORDER — LACTATED RINGERS IV SOLN: INTRAVENOUS | Status: DC

## 2019-07-05 MED ORDER — SODIUM CHLORIDE 0.9 % IR SOLN
Status: DC | PRN
  Administered 2019-07-05: 3000 mL via INTRAVESICAL

## 2019-07-05 MED ORDER — ONDANSETRON HCL 4 MG/2ML IJ SOLN
INTRAMUSCULAR | Status: DC | PRN
  Administered 2019-07-05: 4 mg via INTRAVENOUS

## 2019-07-05 MED ORDER — PROPOFOL 10 MG/ML IV BOLUS
INTRAVENOUS | Status: AC
  Filled 2019-07-05: qty 20

## 2019-07-05 MED ORDER — FENTANYL CITRATE (PF) 100 MCG/2ML IJ SOLN
INTRAMUSCULAR | Status: DC | PRN
  Administered 2019-07-05 (×4): 25 ug via INTRAVENOUS

## 2019-07-05 MED ORDER — PROPOFOL 10 MG/ML IV BOLUS
INTRAVENOUS | Status: DC | PRN
  Administered 2019-07-05: 150 mg via INTRAVENOUS

## 2019-07-05 MED ORDER — OXYCODONE HCL 5 MG PO TABS: 5.0000 mg | ORAL_TABLET | Freq: Once | ORAL | Status: DC | PRN

## 2019-07-05 SURGICAL SUPPLY — 21 items
BAG URO CATCHER STRL LF (MISCELLANEOUS) ×3 IMPLANT
BASKET LASER NITINOL 1.9FR (BASKET) IMPLANT
BASKET ZERO TIP NITINOL 2.4FR (BASKET) ×2 IMPLANT
CATH INTERMIT  6FR 70CM (CATHETERS) ×3 IMPLANT
CLOTH BEACON ORANGE TIMEOUT ST (SAFETY) ×3 IMPLANT
EXTRACTOR STONE 1.7FRX115CM (UROLOGICAL SUPPLIES) IMPLANT
FIBER LASER FLEXIVA 365 (UROLOGICAL SUPPLIES) IMPLANT
FIBER LASER TRAC TIP (UROLOGICAL SUPPLIES) IMPLANT
GLOVE BIO SURGEON STRL SZ7.5 (GLOVE) ×3 IMPLANT
GOWN STRL REUS W/TWL XL LVL3 (GOWN DISPOSABLE) ×5 IMPLANT
GUIDEWIRE ANG ZIPWIRE 038X150 (WIRE) IMPLANT
GUIDEWIRE STR DUAL SENSOR (WIRE) ×3 IMPLANT
KIT TURNOVER KIT A (KITS) ×2 IMPLANT
MANIFOLD NEPTUNE II (INSTRUMENTS) ×3 IMPLANT
PACK CYSTO (CUSTOM PROCEDURE TRAY) ×3 IMPLANT
SHEATH URETERAL 12FRX28CM (UROLOGICAL SUPPLIES) IMPLANT
SHEATH URETERAL 12FRX35CM (MISCELLANEOUS) IMPLANT
STENT CONTOUR 6FRX26X.038 (STENTS) ×2 IMPLANT
TUBING CONNECTING 10 (TUBING) ×2 IMPLANT
TUBING CONNECTING 10' (TUBING) ×1
TUBING UROLOGY SET (TUBING) ×3 IMPLANT

## 2019-07-05 NOTE — Evaluation (Signed)
Physical Therapy Evaluation Patient Details Name: [name] MRN: [id number] DOB: 23-Mar-1941 Today's Date: 07/05/2019   History of Present Illness  [name] is a 79 y.o. male with medical history significant of dementia, Parkinson's, HTN, HLD, falls, frequent UTI  Presented with more confusion and lethargy now requiring assistance for the past 4 days he has strong smelling urine which is typical for him having urinary tract infection.  He has been having harder time ambulating.  Patient has dementia unable to provide his own history.  EMS was called on their arrival febrile up to 101 he was given TylenolPer family patient have not had any cough no congestion or shortness of breath no nausea no vomiting no diarrhea has not been complaining about back pain abdominal pain his blood pressure has been running a bit low so his wife did not give him any blood pressure medications lately.  Denies any known Covid contacts.  Clinical Impression  Pt admitted with above diagnosis. Pt with poor ability to maintain eyes opened, unless persistent with verbal cues and R eye not opening as great as L eye; smile equal, grip strength equal, BLE strength equal. Pt tolerates steps in room with min assist, unable to ambulate further due to inability to maintain eyes opened. Pt requires constant verbal cues for sequencing and eyes opening to reduce risk for falls with mobility. Pt currently with functional limitations due to the deficits listed below (see PT Problem List). Pt will benefit from skilled PT to increase their independence and safety with mobility to allow discharge to the venue listed below.       Follow Up Recommendations Home health PT;Supervision - Intermittent;Supervision for mobility/OOB    Equipment Recommendations  None recommended by PT    Recommendations for Other Services       Precautions / Restrictions Precautions Precautions: Fall Restrictions Weight Bearing Restrictions: No       Mobility  Bed Mobility Overal bed mobility: Needs Assistance Bed Mobility: Supine to Sit     Supine to sit: Min assist     General bed mobility comments: min assist, use of bedrail, verbal cues for sequencing to assist in uprighting trunk and to bring BLE to EOB  Transfers Overall transfer level: Needs assistance Equipment used: Rolling walker (2 wheeled) Transfers: Sit to/from [employer] Sit to Stand: Min assist Stand pivot transfers: Min assist       General transfer comment: verbal cues for hand placement to assist in pushing to rise to stand, min assist to return to sitting and avoid uncontrolled lowering  Ambulation/Gait Ambulation/Gait assistance: Min assist Gait Distance (Feet): 5 Feet Assistive device: Rolling walker (2 wheeled) Gait Pattern/deviations: Decreased step length - right;Decreased step length - left;Decreased stride length Gait velocity: decreased   General Gait Details: limited to steps in room at bedside due to inability to maintain eyes opened, min assist for steadying and RW management despite education to open eyes  Stairs            Wheelchair Mobility    Modified Rankin (Stroke Patients Only)       Balance Overall balance assessment: Needs assistance Sitting-balance support: Feet supported;Bilateral upper extremity supported Sitting balance-Leahy Scale: Fair Sitting balance - Comments: initially mod assist to maintain upright and prevent loss of balance backwards, progressing to min guard assist with verbal cues   Standing balance support: During functional activity;Bilateral upper extremity supported Standing balance-Leahy Scale: Poor Standing balance comment: min assist with RW  Pertinent Vitals/Pain Pain Assessment: No/denies pain    Home Living Family/patient expects to be discharged to:: Private residence Living Arrangements: Spouse/significant other;Other  relatives(Spouse home all day, niece going to school for nursing) Available Help at Discharge: Family Type of Home: House Home Access: Stairs to enter Entrance Stairs-Rails: Right;Left;Can reach both Entrance Stairs-Number of Steps: 1-2 Home Layout: Two level;Able to live on main level with bedroom/bathroom Home Equipment: Walker - 4 wheels;Cane - single point Additional Comments: Pt with varying responses, but ultimately reports he ambulates independently with 4wheeled RW    Prior Function Level of Independence: Independent with assistive device(s);Needs assistance   Gait / Transfers Assistance Needed: Per chart review, pt requiring increasing assistance over the last couple of days but pt unable to recall what they assisted him with  ADL's / Homemaking Assistance Needed: Per chart review, pt requiring increasing assistance over the last couple of days but pt unable to recall what they assisted him with  Comments: Pt with varying responses, but ultimately reports he ambulates independently with 4wheeled RW around home, independent with bathing and dressing, spouse performing household chores and driving.     Hand Dominance   Dominant Hand: Right    Extremity/Trunk Assessment   Upper Extremity Assessment Upper Extremity Assessment: Defer to OT evaluation    Lower Extremity Assessment Lower Extremity Assessment: Generalized weakness    Cervical / Trunk Assessment Cervical / Trunk Assessment: Normal  Communication   Communication: No difficulties  Cognition Arousal/Alertness: Lethargic Behavior During Therapy: Flat affect Overall Cognitive Status: History of cognitive impairments - at baseline                                 General Comments: Pt maintains eyes closed with occasional opening with max verbal cues, varying responses to PLOF, reports year is 1991, oriented to self and location      General Comments      Exercises     Assessment/Plan    PT  Assessment Patient needs continued PT services  PT Problem List Decreased strength;Decreased range of motion;Decreased activity tolerance;Decreased balance;Decreased cognition;Decreased knowledge of use of DME;Decreased safety awareness       PT Treatment Interventions DME instruction;Gait training;Stair training;Functional mobility training;Therapeutic activities;Therapeutic exercise;Balance training;Neuromuscular re-education;Patient/family education;Manual techniques;Modalities    PT Goals (Current goals can be found in the Care Plan section)  Acute Rehab PT Goals Patient Stated Goal: return home with spouse and family to assist PT Goal Formulation: With patient Time For Goal Achievement: 07/12/19 Potential to Achieve Goals: Good    Frequency Min 3X/week   Barriers to discharge        Co-evaluation PT/OT/SLP Co-Evaluation/Treatment: Yes Reason for Co-Treatment: Necessary to address cognition/behavior during functional activity;For patient/therapist safety;To address functional/ADL transfers PT goals addressed during session: Mobility/safety with mobility;Balance;Proper use of DME;Strengthening/ROM         AM-PAC PT "6 Clicks" Mobility  Outcome Measure Help needed turning from your back to your side while in a flat bed without using bedrails?: A Little Help needed moving from lying on your back to sitting on the side of a flat bed without using bedrails?: A Little Help needed moving to and from a bed to a chair (including a wheelchair)?: A Little Help needed standing up from a chair using your arms (e.g., wheelchair or bedside chair)?: A Little Help needed to walk in hospital room?: A Little Help needed climbing 3-5 steps with a railing? :  A Little 6 Click Score: 18    End of Session Equipment Utilized During Treatment: Gait belt Activity Tolerance: Patient tolerated treatment well;Patient limited by lethargy Patient left: in chair;with call bell/phone within reach;with  chair alarm set Nurse Communication: Mobility status PT Visit Diagnosis: Unsteadiness on feet (R26.81);Other abnormalities of gait and mobility (R26.89);Muscle weakness (generalized) (M62.81)    Time: 3532-9924 PT Time Calculation (min) (ACUTE ONLY): 26 min   Charges:   PT Evaluation $PT Eval Moderate Complexity: 1 Mod           Tori Taaliyah Delpriore PT, DPT 07/05/19, 12:43 PM [phone]

## 2019-07-05 NOTE — Interval H&P Note (Signed)
History and Physical Interval Note:  07/05/2019 4:50 PM  [name]  has presented today for surgery, with the diagnosis of LEFT URETERAL STONE.  The various methods of treatment have been discussed with the patient and family. After consideration of risks, benefits and other options for treatment, the patient has consented to  Procedure(s): CYSTOSCOPY/URETEROSCOPY/HOLMIUM LASER/STENT PLACEMENT (Left) as a surgical intervention.  The patient's history has been reviewed, patient examined, no change in status, stable for surgery.  I have reviewed the patient's chart and labs.  Questions were answered to the patient's satisfaction.     Ray Church, III

## 2019-07-05 NOTE — Anesthesia Preprocedure Evaluation (Signed)
Anesthesia Evaluation  Patient identified by MRN, date of birth, ID band Patient awake    Reviewed: Allergy & Precautions, NPO status , Patient's Chart, lab work & pertinent test results  Airway Mallampati: I  TM Distance: >3 FB Neck ROM: Full    Dental  (+) Edentulous Lower, Upper Dentures   Pulmonary former smoker,    Pulmonary exam normal breath sounds clear to auscultation       Cardiovascular hypertension, Pt. on medications Normal cardiovascular exam Rhythm:Regular Rate:Normal     Neuro/Psych PSYCHIATRIC DISORDERS Dementia Parkinson's Disease  Neuromuscular disease    GI/Hepatic negative GI ROS, Neg liver ROS,   Endo/Other  negative endocrine ROS  Renal/GU Renal diseaseLeft ureteral calculus  negative genitourinary   Musculoskeletal  (+) Arthritis , Osteoarthritis,    Abdominal (+) + obese,   Peds  Hematology   Anesthesia Other Findings   Reproductive/Obstetrics                             Anesthesia Physical Anesthesia Plan  ASA: III  Anesthesia Plan: General   Post-op Pain Management:    Induction: Intravenous  PONV Risk Score and Plan: Ondansetron, Treatment may vary due to age or medical condition and Dexamethasone  Airway Management Planned: LMA  Additional Equipment:   Intra-op Plan:   Post-operative Plan: Extubation in OR  Informed Consent: I have reviewed the patients History and Physical, chart, labs and discussed the procedure including the risks, benefits and alternatives for the proposed anesthesia with the patient or authorized representative who has indicated his/her understanding and acceptance.   Patient has DNR.  Discussed DNR with patient and Suspend DNR.   Dental advisory given  Plan Discussed with: CRNA and Surgeon  Anesthesia Plan Comments:         Anesthesia Quick Evaluation

## 2019-07-05 NOTE — Anesthesia Postprocedure Evaluation (Signed)
Anesthesia Post Note  Patient: [name]  Procedure(s) Performed: CYSTOSCOPY/URETEROSCOPY/STENT PLACEMENT/REROGRADE (Left )     Patient location during evaluation: PACU Anesthesia Type: General Level of consciousness: awake and alert Pain management: pain level controlled Vital Signs Assessment: post-procedure vital signs reviewed and stable Respiratory status: spontaneous breathing, nonlabored ventilation and respiratory function stable Cardiovascular status: blood pressure returned to baseline and stable Postop Assessment: no apparent nausea or vomiting Anesthetic complications: no    Last Vitals:  Vitals:   07/05/19 1745 07/05/19 1800  BP: (!) 154/71 (!) 169/80  Pulse: 81 90  Resp: 10 19  Temp:    SpO2: 98% 99%    Last Pain:  Vitals:   07/05/19 1800  TempSrc:   PainSc: 0-No pain                 Merilee Wible A.

## 2019-07-05 NOTE — Discharge Instructions (Signed)

## 2019-07-05 NOTE — Progress Notes (Deleted)
CC/HPI: Left ureteral calculus  HPI:  07/05/2019  79-year-old male presents with a 10 day history of left flank pain. He went to the emergency department on new year's Day. CT scan was performed that revealed a proximal 7 mm ureteral calculus. There was upstream hydronephrosis. Stone was not visible on [facility]. He continues to have left-sided flank pain and microscopic hematuria. He has not passed the stone. Stone is not visible on KUB today. He denies any fever, nausea, vomiting. He continues to require pain medication. He states his primary care physician is checking his PSA and prostate.     ALLERGIES: No Known Drug Allergies    MEDICATIONS: No Medications    GU PSH: None   NON-GU PSH: Neck Spine Fusion     GU PMH: None     PMH Notes:  1898-06-24 00:00:00 - Note: Normal Routine History And Physical Adult   NON-GU PMH: Hypercholesterolemia Hypertension    FAMILY HISTORY: Prostate Cancer - Father   SOCIAL HISTORY: Marital Status: Married Preferred Language: English Does not drink caffeine.    REVIEW OF SYSTEMS:    GU Review Male:   Patient denies frequent urination, hard to postpone urination, burning/ pain with urination, get up at night to urinate, leakage of urine, stream starts and stops, trouble starting your stream, have to strain to urinate , erection problems, and penile pain.  Gastrointestinal (Upper):   Patient reports nausea and vomiting. Patient denies indigestion/ heartburn.  Gastrointestinal (Lower):   Patient reports constipation. Patient denies diarrhea.  Constitutional:   Patient denies fever, night sweats, weight loss, and fatigue.  Skin:   Patient reports itching. Patient denies skin rash/ lesion.  Eyes:   Patient denies blurred vision and double vision.  Ears/ Nose/ Throat:   Patient denies sore throat and sinus problems.  Hematologic/Lymphatic:   Patient denies swollen glands and easy bruising.  Cardiovascular:   Patient denies leg swelling and  chest pains.  Respiratory:   Patient denies cough and shortness of breath.  Endocrine:   Patient denies excessive thirst.  Musculoskeletal:   Patient denies back pain and joint pain.  Neurological:   Patient denies headaches and dizziness.  Psychologic:   Patient denies depression and anxiety.   VITAL SIGNS:      07/05/2019 09:17 AM  Weight 160 lb / 72.57 kg  Height 71 in / 180.34 cm  BP 145/75 mmHg  Heart Rate 67 /min  Temperature 97.5 F / 36.3 C  BMI 22.3 kg/m   MULTI-SYSTEM PHYSICAL EXAMINATION:    Constitutional: Well-nourished. No physical deformities. Normally developed. Good grooming.  Respiratory: No labored breathing, no use of accessory muscles.   Cardiovascular: Normal temperature, adequate perfusion of extremities  Skin: No paleness, no jaundice  Neurologic / Psychiatric: Oriented to time, oriented to place, oriented to person. No depression, no anxiety, no agitation.  Gastrointestinal: No mass, no tenderness, no rigidity, non obese abdomen.  Eyes: Normal conjunctivae. Normal eyelids.  Musculoskeletal: Normal gait and station of head and neck.     PAST DATA REVIEWED:  Source Of History:  Patient  Records Review:   Previous Doctor Records, Previous Patient Records  X-Ray Review: C.T. Abdomen/Pelvis: Reviewed Films. Reviewed Report. Discussed With Patient.     PROCEDURES:         KUB - 74018  A single view of the abdomen is obtained.  Stone is not visible on KUB      Patient confirmed No Neulasta OnPro Device.             Urinalysis w/Scope Dipstick Dipstick Cont'd Micro  Color: Yellow Bilirubin: Neg mg/dL WBC/hpf: NS (Not Seen)  Appearance: Clear Ketones: Neg mg/dL RBC/hpf: 3 - 10/hpf  Specific Gravity: 1.020 Blood: 3+ ery/uL Bacteria: NS (Not Seen)  pH: <=5.0 Protein: Neg mg/dL Cystals: NS (Not Seen)  Glucose: Neg mg/dL Urobilinogen: 0.2 mg/dL Casts: NS (Not Seen)    Nitrites: Neg Trichomonas: Not Present    Leukocyte Esterase: Neg leu/uL Mucous: Not  Present      Epithelial Cells: NS (Not Seen)      Yeast: NS (Not Seen)      Sperm: Not Present    ASSESSMENT:      ICD-10 Details  1 GU:   Ureteral calculus - N20.1 Undiagnosed New Problem  2   Ureteral obstruction secondary to calculous - N13.2 Undiagnosed New Problem  3   Renal colic - N23 Undiagnosed New Problem   PLAN:           Orders X-Rays: KUB          Schedule         Document Letter(s):  Created for Patient: Clinical Summary         Notes:   He would like to proceed with ureteroscopy with laser lithotripsy and ureteral stent placement. Risks and benefits discussed including but not limited to bleeding, infection, injury to surrounding structures including potential for ureteral avulsion, need for additional procedures.   CC: Cory Nafziger     Signed by  Bell, III, M.D. on 07/05/19 at 9:49 AM (EST 

## 2019-07-05 NOTE — Transfer of Care (Signed)
Immediate Anesthesia Transfer of Care Note  Patient: [name]  Procedure(s) Performed: Procedure(s): CYSTOSCOPY/URETEROSCOPY/STENT PLACEMENT/REROGRADE (Left)  Patient Location: PACU  Anesthesia Type:General  Level of Consciousness:  sedated, patient cooperative and responds to stimulation  Airway & Oxygen Therapy:Patient Spontanous Breathing and Patient connected to face mask oxgen  Post-op Assessment:  Report given to PACU RN and Post -op Vital signs reviewed and stable  Post vital signs:  Reviewed and stable  Last Vitals:  Vitals:   07/05/19 1433 07/05/19 1737  BP: (!) 170/88 (!) 144/67  Pulse: 83 73  Resp: 18 13  Temp: 36.9 C 37 C  SpO2: 95% 100%    Complications: No apparent anesthesia complications

## 2019-07-05 NOTE — Progress Notes (Signed)
Patient underwent a renal ultrasound.  This revealed persistent hydronephrosis.  In all likelihood he has not passed the stone.  Creatinine continues to be elevated and his blood culture is positive.  Therefore, plan for intervention today.  It is a distal stone so plan for possible ureteral stone extraction with stent placement.  Keep patient NPO.

## 2019-07-05 NOTE — Progress Notes (Signed)
PROGRESS NOTE    [name]  TDD:[id number] DOB: [birth year] DOA: 07/03/2019 PCP: Cassandria Anger, MD    Brief Narrative:  [name] is a 79 year old Caucasian male with past medical history remarkable for dementia, Parkinson's disease, HTN, HLD, frequent falls and history of previous UTIs who presented to the ED with confusion and lethargy.  Reports that he has had this strong smelling urine which is atypical symptom/sign for recurrent UTI.  He is also been having a difficult time ambulating with progressive weakness over the past 4 days, now requiring assistance with ambulation.  Upon EMS arrival, he was noted to be febrile up to 101.0 and was given Tylenol.  Per family patient have not had any cough no congestion or shortness of breath no nausea no vomiting no diarrhea has not been complaining about back pain abdominal pain his blood pressure has been running a bit low so his wife did not give him any blood pressure medications lately. Denies any known Covid contacts.  In the ED, noted temperature 102.0.  HR 114, RR 20, BP 100/54, with SPO2 94% on room air.  Sodium 139, potassium 3.9, chloride 105, CO2 26, glucose 154, BUN 48, creatinine 3.09.  WBC count 12.0, hemoglobin 12.5, platelets 120.  Influenza negative.  Covid-19/SARS-CoV-2 negative.  Urinalysis with small leukoesterase, negative nitrite, many bacteria, 21-50 WBCs.  Patient referred by ED physician for admission given sepsis and further evaluation and treatment.   Assessment & Plan:   Active Problems:   Essential hypertension   Dyslipidemia   Falls frequently   ARF (acute renal failure) (HCC)   Sepsis ([place])   Dementia due to Parkinson's disease without behavioral disturbance ([place])   CKD (chronic kidney disease), stage III   Constipation   Acute metabolic encephalopathy   Sepsis, present on admission Klebsiella pneumonia septicemia/UTI Left hydronephrosis with obstructing stone Patient presenting from home with  weakness, lethargy over the past 4 days with foul-smelling urine.  History of recurrent UTIs.  On presentation he was noted to be septic with elevated heart rate, elevated respiratory rate, fever and endorgan damage with renal failure.  Lactic acid 0.9 with procalcitonin elevated 4.18.  Ultrasound renal with mild left hydronephrosis.  CT abdomen/pelvis with left-sided obstructive uropathy with 2 mm stone in the left UV junction with mild hydroutero nephrosis.  Blood culture 1/2+ GNR's with BC ID remarkable for Klebsiella pneumonia.  Repeat renal ultrasound shows persistent mild left hydronephrosis. --WBC 12.0-->9.0-->7.0 --Ceftriaxone 2 g IV every 24 hours --NS at 100 mL's per hour --Started on doxazosin 4 mg p.o. daily by urology --N.p.o. --Cystoscopy with likely stone extraction/stent placement by urology today  Acute renal failure Creatinine 3.09 on admission.  Etiology likely secondary to prerenal azotemia from severe dehydration versus ATN from underlying septicemia/UTI.  Urine sodium 45, urine creatinine 222, FeNa 0.5% consistent with prerenal. --Cr 3.09-->2.38-->2.06 --Continue IV fluids with NS at 100 mL's per hour --Holding home ACE inhibitor and loop diuretic --Avoid nephrotoxins, renally dose all medications --Monitor BMP daily  Parkinson's dementia --Continue Sinemet 25-200 mg p.o. 3 times daily --Trazodone 100 mg p.o. nightly  Essential hypertension BP 126/66, well controlled --Continue to hold home amlodipine-benazepril 5-20 mg p.o. daily, also holding home furosemide 40 mg p.o. daily that he uses prn --Continue to monitor closely  Frequent falls: --PT recommends home health PT   DVT prophylaxis: SCDs, holding chemical DVT prophylaxis, pending cystoscopy today Code Status: DNR Family Communication: Updated patient's spouse, [name] via telephone and at bedside today  Disposition Plan: Remain inpatient, IV antibiotics, awaiting finalized cultures, cystoscopy today, likely  home with home health when medically ready.   Consultants:   Urology - Dr. Alvester Morin  Procedures:   None  Antimicrobials:   Ceftriaxone   Subjective: Patient seen and examined bedside, resting comfortably remains pleasantly confused.  No family present this morning.  Denies headache, no chest pain, palpitations, no shortness of breath, no abdominal pain.  No acute concerns per nursing staff.  Discussed with urology at bedside, Dr. Alvester Morin.  We will repeat renal ultrasound this morning, given only mild improvement in his renal function and now with septicemia, if continued hydronephrosis, likely will proceed with cystoscopy this afternoon.  Objective: Vitals:   07/05/19 0220 07/05/19 0647 07/05/19 1021 07/05/19 1242  BP: (!) 161/77 (!) 143/74 (!) 145/70 140/72  Pulse: 87 78  86  Resp: 20 20  16   Temp: 98.5 F (36.9 C) 98.4 F (36.9 C)  98.2 F (36.8 C)  TempSrc: Oral Oral  Oral  SpO2: 97% 97%  96%    Intake/Output Summary (Last 24 hours) at 07/05/2019 1426 Last data filed at 07/05/2019 09/02/2019 Gross per 24 hour  Intake 1523.56 ml  Output 1000 ml  Net 523.56 ml   There were no vitals filed for this visit.  Examination:  General exam: Appears calm and comfortable, pleasantly confused, chronically ill in appearance Respiratory system: Clear to auscultation. Respiratory effort normal.  Oxygenating well on room air Cardiovascular system: S1 & S2 heard, RRR. No JVD, murmurs, rubs, gallops or clicks. No pedal edema. Gastrointestinal system: Abdomen is nondistended, soft and nontender. No organomegaly or masses felt. Normal bowel sounds heard. GU: Condom catheter noted Central nervous system: Alert, not oriented to person/place/situation/time. No focal neurological deficits. Extremities: Symmetric 5 x 5 power. Skin: No rashes, lesions or ulcers Psychiatry: Judgement and insight appear poor. Mood & affect appropriate.     Data Reviewed: I have personally reviewed following labs and  imaging studies  CBC: Recent Labs  Lab 07/03/19 1350 07/04/19 0757 07/05/19 0540  WBC 12.0* 9.8 7.0  NEUTROABS 10.1*  --   --   HGB 12.5* 11.3* 12.2*  HCT 37.5* 34.1* 37.9*  MCV 89.9 88.8 91.1  PLT 120* 120* 131*   Basic Metabolic Panel: Recent Labs  Lab 07/03/19 1350 07/04/19 0757 07/05/19 0540  NA 139 136 140  K 3.9 3.6 3.5  CL 105 104 104  CO2 26 25 23   GLUCOSE 154* 116* 103*  BUN 48* 44* 37*  CREATININE 3.09* 2.38* 2.06*  CALCIUM 8.8* 8.4* 8.1*  MG  --  1.9  --   PHOS  --  2.4*  --    GFR: CrCl cannot be calculated (Unknown ideal weight.). Liver Function Tests: Recent Labs  Lab 07/03/19 1350 07/04/19 0757  AST 13* 11*  ALT 11 11  ALKPHOS 56 52  BILITOT 1.7* 1.3*  PROT 6.1* 5.4*  ALBUMIN 3.3* 2.7*   No results for input(s): LIPASE, AMYLASE in the last 168 hours. No results for input(s): AMMONIA in the last 168 hours. Coagulation Profile: Recent Labs  Lab 07/03/19 1350  INR 1.4*   Cardiac Enzymes: No results for input(s): CKTOTAL, CKMB, CKMBINDEX, TROPONINI in the last 168 hours. BNP (last 3 results) No results for input(s): PROBNP in the last 8760 hours. HbA1C: No results for input(s): HGBA1C in the last 72 hours. CBG: No results for input(s): GLUCAP in the last 168 hours. Lipid Profile: No results for input(s): CHOL, HDL, LDLCALC, TRIG, CHOLHDL,  LDLDIRECT in the last 72 hours. Thyroid Function Tests: Recent Labs    07/04/19 0757  TSH 1.545   Anemia Panel: No results for input(s): VITAMINB12, FOLATE, FERRITIN, TIBC, IRON, RETICCTPCT in the last 72 hours. Sepsis Labs: Recent Labs  Lab 07/03/19 1330 07/03/19 1943 07/03/19 2249  PROCALCITON  --  4.18  --   LATICACIDVEN 1.7 0.9 0.9    Recent Results (from the past 240 hour(s))  Blood Culture (routine x 2)     Status: None (Preliminary result)   Collection Time: 07/03/19  1:30 PM   Specimen: BLOOD  Result Value Ref Range Status   Specimen Description   Final    BLOOD RIGHT  ANTECUBITAL Performed at [facility], [address] [place], [place] [address]    Special Requests   Final    BOTTLES DRAWN AEROBIC AND ANAEROBIC Blood Culture adequate volume Performed at [facility], [address], [place], [place] [address]    Culture   Final    NO GROWTH 2 DAYS Performed at [facility], [address], [place], [place] [address]    Report Status PENDING  Incomplete  Blood Culture (routine x 2)     Status: Abnormal (Preliminary result)   Collection Time: 07/03/19  2:22 PM   Specimen: BLOOD LEFT HAND  Result Value Ref Range Status   Specimen Description   Final    BLOOD LEFT HAND Performed at [facility], [address], [place], [place] [address]    Special Requests   Final    BOTTLES DRAWN AEROBIC AND ANAEROBIC Blood Culture results may not be optimal due to an inadequate volume of blood received in culture bottles Performed at [facility], [address], [place], [place] [address]    Culture  Setup Time   Final    GRAM NEGATIVE RODS IN BOTH AEROBIC AND ANAEROBIC BOTTLES CRITICAL RESULT CALLED TO, READ BACK BY AND VERIFIED WITH: L. POINDEXTER, PHARMD (WL) AT 1919 ON 07/04/19 BY C. JESSUP, MT.    Culture (A)  Final    KLEBSIELLA PNEUMONIAE SUSCEPTIBILITIES TO FOLLOW Performed at [facility], [address], [place], [place] [address]    Report Status PENDING  Incomplete  Blood Culture ID Panel (Reflexed)     Status: Abnormal   Collection Time: 07/03/19  2:22 PM  Result Value Ref Range Status   Enterococcus species NOT DETECTED NOT DETECTED Final   Listeria monocytogenes NOT DETECTED NOT DETECTED Final   Staphylococcus species NOT DETECTED NOT DETECTED Final   Staphylococcus aureus (BCID) NOT DETECTED NOT DETECTED Final   Streptococcus species NOT DETECTED NOT DETECTED Final   Streptococcus agalactiae NOT DETECTED NOT DETECTED Final   Streptococcus pneumoniae  NOT DETECTED NOT DETECTED Final   Streptococcus pyogenes NOT DETECTED NOT DETECTED Final   Acinetobacter baumannii NOT DETECTED NOT DETECTED Final   Enterobacteriaceae species DETECTED (A) NOT DETECTED Final    Comment: Enterobacteriaceae represent a large family of gram-negative bacteria, not a single organism. CRITICAL RESULT CALLED TO, READ BACK BY AND VERIFIED WITH: L. POINDEXTER, PHARMD (WL) AT 1919 ON 07/04/19 BY C. JESSUP, MT.    Enterobacter cloacae complex NOT DETECTED NOT DETECTED Final   Escherichia coli NOT DETECTED NOT DETECTED Final   Klebsiella oxytoca NOT DETECTED NOT DETECTED Final   Klebsiella pneumoniae DETECTED (A) NOT DETECTED Final    Comment: CRITICAL RESULT CALLED TO, READ BACK BY AND VERIFIED WITH: L. POINDEXTER, PHARMD (WL)  AT 1919 ON 07/04/19 BY C. JESSUP, MT.    Proteus species NOT DETECTED NOT DETECTED Final   Serratia marcescens NOT DETECTED NOT DETECTED Final   Carbapenem resistance NOT DETECTED NOT DETECTED Final   Haemophilus influenzae NOT DETECTED NOT DETECTED Final   Neisseria meningitidis NOT DETECTED NOT DETECTED Final   Pseudomonas aeruginosa NOT DETECTED NOT DETECTED Final   Candida albicans NOT DETECTED NOT DETECTED Final   Candida glabrata NOT DETECTED NOT DETECTED Final   Candida krusei NOT DETECTED NOT DETECTED Final   Candida parapsilosis NOT DETECTED NOT DETECTED Final   Candida tropicalis NOT DETECTED NOT DETECTED Final    Comment: Performed at [facility], [address] [place], [place] [address]  Respiratory Panel by RT PCR (Flu A&B, Covid) - Nasopharyngeal Swab     Status: None   Collection Time: 07/03/19  3:23 PM   Specimen: Nasopharyngeal Swab  Result Value Ref Range Status   SARS Coronavirus 2 by RT PCR NEGATIVE NEGATIVE Final    Comment: (NOTE) SARS-CoV-2 target nucleic acids are NOT DETECTED. The SARS-CoV-2 RNA is generally detectable in upper respiratoy specimens during the acute phase of infection. The  lowest concentration of SARS-CoV-2 viral copies this assay can detect is 131 copies/mL. A negative result does not preclude SARS-Cov-2 infection and should not be used as the sole basis for treatment or other patient management decisions. A negative result may occur with  improper specimen collection/handling, submission of specimen other than nasopharyngeal swab, presence of viral mutation(s) within the areas targeted by this assay, and inadequate number of viral copies (<131 copies/mL). A negative result must be combined with clinical observations, patient history, and epidemiological information. The expected result is Negative. Fact Sheet for Patients:  https://www.moore.com/ Fact Sheet for Healthcare Providers:  https://www.young.biz/ This test is not yet ap proved or cleared by the [place] FDA and  has been authorized for detection and/or diagnosis of SARS-CoV-2 by FDA under an Emergency Use Authorization (EUA). This EUA will remain  in effect (meaning this test can be used) for the duration of the COVID-19 declaration under Section 564(b)(1) of the Act, 21 U.S.C. section 360bbb-3(b)(1), unless the authorization is terminated or revoked sooner.    Influenza A by PCR NEGATIVE NEGATIVE Final   Influenza B by PCR NEGATIVE NEGATIVE Final    Comment: (NOTE) The Xpert Xpress SARS-CoV-2/FLU/RSV assay is intended as an aid in  the diagnosis of influenza from Nasopharyngeal swab specimens and  should not be used as a sole basis for treatment. Nasal washings and  aspirates are unacceptable for Xpert Xpress SARS-CoV-2/FLU/RSV  testing. Fact Sheet for Patients: https://www.moore.com/ Fact Sheet for Healthcare Providers: https://www.young.biz/ This test is not yet approved or cleared by the [place] FDA and  has been authorized for detection and/or diagnosis of SARS-CoV-2 by  FDA under an Emergency  Use Authorization (EUA). This EUA will remain  in effect (meaning this test can be used) for the duration of the  Covid-19 declaration under Section 564(b)(1) of the Act, 21  U.S.C. section 360bbb-3(b)(1), unless the authorization is  terminated or revoked. Performed at [facility], [address], [place], [place] [address]   Urine culture     Status: Abnormal   Collection Time: 07/03/19  4:18 PM   Specimen: In/Out Cath Urine  Result Value Ref Range Status   Specimen Description   Final    IN/OUT CATH URINE Performed at [facility], [address] [place],  [place] [address]    Special Requests   Final    NONE Performed at [facility], [address] [place], [place] [address]    Culture >=100,000 COLONIES/mL KLEBSIELLA PNEUMONIAE (A)  Final   Report Status 07/05/2019 FINAL  Final   Organism ID, Bacteria KLEBSIELLA PNEUMONIAE (A)  Final      Susceptibility   Klebsiella pneumoniae - MIC*    AMPICILLIN >=32 RESISTANT Resistant     CEFAZOLIN <=4 SENSITIVE Sensitive     CEFTRIAXONE <=0.25 SENSITIVE Sensitive     CIPROFLOXACIN <=0.25 SENSITIVE Sensitive     GENTAMICIN <=1 SENSITIVE Sensitive     IMIPENEM <=0.25 SENSITIVE Sensitive     NITROFURANTOIN 64 INTERMEDIATE Intermediate     TRIMETH/SULFA <=20 SENSITIVE Sensitive     AMPICILLIN/SULBACTAM 4 SENSITIVE Sensitive     PIP/TAZO <=4 SENSITIVE Sensitive     * >=100,000 COLONIES/mL KLEBSIELLA PNEUMONIAE  MRSA PCR Screening     Status: None   Collection Time: 07/04/19  5:06 AM   Specimen: Nasal Mucosa; Nasopharyngeal  Result Value Ref Range Status   MRSA by PCR NEGATIVE NEGATIVE Final    Comment:        The GeneXpert MRSA Assay (FDA approved for NASAL specimens only), is one component of a comprehensive MRSA colonization surveillance program. It is not intended to diagnose MRSA infection nor to guide or monitor treatment for MRSA infections. Performed at [facility], [address] [place], [place] [address]   Surgical pcr screen     Status: None   Collection Time: 07/05/19 11:43 AM   Specimen: Nasal Mucosa; Nasal Swab  Result Value Ref Range Status   MRSA, PCR NEGATIVE NEGATIVE Final   Staphylococcus aureus NEGATIVE NEGATIVE Final    Comment: (NOTE) The Xpert SA Assay (FDA approved for NASAL specimens in patients 80 years of age and older), is one component of a comprehensive surveillance program. It is not intended to diagnose infection nor to guide or monitor treatment. Performed at [facility], [address] [place], [place] [address]          Radiology Studies: CT ABDOMEN PELVIS WO CONTRAST  Result Date: 07/04/2019 CLINICAL DATA:  Constipation or obstruction. EXAM: CT ABDOMEN AND PELVIS WITHOUT CONTRAST TECHNIQUE: Multidetector CT imaging of the abdomen and pelvis was performed following the standard protocol without IV contrast. COMPARISON:  None. FINDINGS: LOWER CHEST: Bibasilar atelectasis. HEPATOBILIARY: Normal hepatic contours. No intra- or extrahepatic biliary dilatation. Normal gallbladder. PANCREAS: Normal pancreas. No ductal dilatation or peripancreatic fluid collection. SPLEEN: Normal. ADRENALS/URINARY TRACT: The adrenal glands are normal. There is a stone within the distal left ureter, at the ureterovesical junction, measuring 2 mm, causing mild hydroureteronephrosis and mild perinephric stranding. The right kidney is normal. The urinary bladder is normal for degree of distention STOMACH/BOWEL: There is no hiatal hernia. Normal duodenal course and caliber. No small bowel dilatation or inflammation. No focal colonic abnormality. Normal appendix. VASCULAR/LYMPHATIC: There is calcific atherosclerosis of the abdominal aorta. No abdominal or pelvic lymphadenopathy. REPRODUCTIVE: Normal prostate size with symmetric seminal vesicles. MUSCULOSKELETAL. Multilevel degenerative disc disease and facet  arthrosis. No bony spinal canal stenosis. OTHER: None. IMPRESSION: 1. Left-sided obstructive uropathy with 2 mm stone at the left ureterovesical junction causing mild hydroureteronephrosis and perinephric stranding. 2.  Aortic atherosclerosis (ICD10-I70.0). Electronically Signed   By: Deatra RobinsonKevin  Herman M.D.   On: 07/04/2019 01:56   DG Abd 1 View  Result Date: 07/03/2019 CLINICAL DATA:  Constipation EXAM: ABDOMEN -  1 VIEW COMPARISON:  None. FINDINGS: Mild diffuse gaseous distention of bowel, likely ileus. No significant increase in stool burden. No organomegaly or free air. No acute bony abnormality. IMPRESSION: Diffuse gaseous distention of bowel, likely ileus. Electronically Signed   By: Charlett NoseKevin  Dover M.D.   On: 07/03/2019 20:17   US RENAL  Result Date: 07/05/2019 CLINICAL DATA:  Hydronephrosis EXAM: RENAL / URINARY TRACT ULTRASOUND COMPLETE COMPARISON:  07/03/2019 FINDINGS: Right Kidney: Renal measurements: 12.2 x 5.2 x 4.6 cm = volume: 150.9 mL . Echogenicity within normal limits. No mass or hydronephrosis visualized. Left Kidney: Renal measurements: 11.3 x 6.4 x 6.4 cm = volume: 244.9 mL. Echogenicity within normal limits. Mild hydronephrosis is again identified. No mass visualized. Bladder: Appears normal for degree of bladder distention. Other: None. IMPRESSION: Mild left hydronephrosis similar to prior study. Electronically Signed   By: Guadlupe SpanishPraneil  Patel M.D.   On: 07/05/2019 10:34   US Renal  Result Date: 07/03/2019 CLINICAL DATA:  79 year old male with acute renal insufficiency. EXAM: RENAL / URINARY TRACT ULTRASOUND COMPLETE COMPARISON:  Renal ultrasound dated 02/13/2019. FINDINGS: Right Kidney: Renal measurements: 12.6 x 5.1 x 5.1 cm = volume: 172 mL. Mild increased echogenicity. No hydronephrosis or shadowing stone. Left Kidney: Renal measurements: 12.1 x 6.5 x 7.0 cm = volume: 287 mL. Mildly echogenic. There is mild left hydronephrosis. No shadowing stone. Bladder: The urinary bladder is partially  distended and grossly unremarkable. Other: None. IMPRESSION: 1. Mild increased renal echogenicity may be related to chronic kidney disease. 2. Mild left hydronephrosis.  No shadowing stone. Electronically Signed   By: Elgie CollardArash  Radparvar M.D.   On: 07/03/2019 20:49        Scheduled Meds: . carbidopa-levodopa  1 tablet Oral TID  . doxazosin  4 mg Oral Daily  . traZODone  100 mg Oral QHS   Continuous Infusions: . sodium chloride 100 mL/hr at 07/04/19 2320  . cefTRIAXone (ROCEPHIN)  IV       LOS: 1 day    Time spent: 36 minutes spent on chart review, discussion with nursing staff, consultants, updating family and interview/physical exam; more than 50% of that time was spent in counseling and/or coordination of care.    Alvira PhilipsEric J [place], DO [facility] 07/05/2019, 2:26 PM

## 2019-07-05 NOTE — Op Note (Signed)
Operative Note  Preoperative diagnosis:  1.  Left ureteral calculus  Postoperative diagnosis: 1.  Left ureteral calculus  Procedure(s): 1.  Cystoscopy with left retrograde pyelogram, left ureteroscopy with stone extraction, left ureteral stent placement  Surgeon: Modena Slater, MD  Assistants: None  Anesthesia: General  Complications: None immediate  EBL: Minimal  Specimens: 1.  None  Drains/Catheters: 1.  6 x 24 double-J ureteral stent  Intraoperative findings: 1.  Normal urethra 2.  Normal bladder mucosa 3.  2 mm distal left ureteral calculus atraumatically basket extracted from the distal ureter 4.  Retrograde pyelogram revealed hydroureteronephrosis  Indication: 79 year old male with a distal left ureteral calculus with urinary tract infection and bacteremia.  He has been on antibiotics for a couple of days now.  He failed to pass the stone and therefore presents for the previously mentioned operation.  Description of procedure:  The patient was identified and consent was obtained.  The patient was taken to the operating room and placed in the supine position.  The patient was placed under general anesthesia.  Perioperative antibiotics were administered.  The patient was placed in dorsal lithotomy.  Patient was prepped and draped in a standard sterile fashion and a timeout was performed.  A 21 French rigid cystoscope was advanced into the urethra and into the bladder.  Complete cystoscopy was performed with the findings noted above.  I advanced a sensor wire into the left ureter and up to the kidney under fluoroscopic guidance.  A semirigid ureteroscope was advanced alongside the wire and into the distal ureter where the stone was encountered and was basket extracted.  I readvanced the stone into the distal ureter and shot a retrograde pyelogram with the findings noted above.  This was the only stone on CT and therefore given that he had a recently infected system, I decided not  to manipulate the system with the scope any further.  I backloaded the wire onto a rigid cystoscope and advanced that into the bladder followed by routine placement of a 6 x 24 double-J ureteral stent.  Fluoroscopy confirmed proximal placement and direct visualization confirmed a good coil within the bladder.  I drained the bladder and withdrew the scope.  Patient tolerated the procedure well was stable postoperative.  Plan: Follow-up in 1 week for stent removal

## 2019-07-05 NOTE — Anesthesia Procedure Notes (Signed)
Procedure Name: LMA Insertion Date/Time: 07/05/2019 5:04 PM Performed by: Paris Lore, CRNA Pre-anesthesia Checklist: Patient identified, Emergency Drugs available, Suction available, Patient being monitored and Timeout performed Patient Re-evaluated:Patient Re-evaluated prior to induction Oxygen Delivery Method: Circle system utilized Preoxygenation: Pre-oxygenation with 100% oxygen Induction Type: IV induction Ventilation: Mask ventilation without difficulty LMA: LMA inserted LMA Size: 4.0 Number of attempts: 1 Placement Confirmation: positive ETCO2 and breath sounds checked- equal and bilateral Tube secured with: Tape

## 2019-07-05 NOTE — Evaluation (Signed)
SLP Cancellation Note  Patient Details Name: [name] MRN: [id number] DOB: [birth year]   Cancelled treatment:       Reason Eval/Treat Not Completed: Other (comment)(pt npo for urology procedure today, will continue efforts)   Chales Abrahams 07/05/2019, 11:41 AM   Rolena Infante, MS [facility] SLP Acute Rehab Services Office [phone]

## 2019-07-05 NOTE — Evaluation (Signed)
Occupational Therapy Evaluation Patient Details Name: [name] MRN: [id number] DOB: 1941/06/17 Today's Date: 07/05/2019    History of Present Illness [name] is a 79 y.o. male with medical history significant of dementia, Parkinson's, HTN, HLD, falls, frequent UTI  Presented with more confusion and lethargy now requiring assistance for the past 4 days he has strong smelling urine which is typical for him having urinary tract infection.  He has been having harder time ambulating.  Patient has dementia unable to provide his own history.  EMS was called on their arrival febrile up to 101 he was given TylenolPer family patient have not had any cough no congestion or shortness of breath no nausea no vomiting no diarrhea has not been complaining about back pain abdominal pain his blood pressure has been running a bit low so his wife did not give him any blood pressure medications lately.  Denies any known Covid contacts.   Clinical Impression   Pt admitted with the above/ HTN. Pt currently with functional limitations due to the deficits listed below (see OT Problem List).  Pt will benefit from skilled OT to increase their safety and independence with ADL and functional mobility for ADL to facilitate discharge to venue listed below.      Follow Up Recommendations  Supervision/Assistance - 24 hour    Equipment Recommendations  3 in 1 bedside commode    Recommendations for Other Services       Precautions / Restrictions Precautions Precautions: Fall Restrictions Weight Bearing Restrictions: No      Mobility Bed Mobility Overal bed mobility: Needs Assistance Bed Mobility: Supine to Sit     Supine to sit: Min assist     General bed mobility comments: min assist, use of bedrail, verbal cues for sequencing to assist in uprighting trunk and to bring BLE to EOB  Transfers Overall transfer level: Needs assistance Equipment used: Rolling walker (2 wheeled) Transfers: Sit to/from  [employer] Sit to Stand: Min assist Stand pivot transfers: Min assist       General transfer comment: verbal cues for hand placement to assist in pushing to rise to stand, min assist to return to sitting and avoid uncontrolled lowering    Balance Overall balance assessment: Needs assistance Sitting-balance support: Feet supported;Bilateral upper extremity supported Sitting balance-Leahy Scale: Fair Sitting balance - Comments: initially mod assist to maintain upright and prevent loss of balance backwards, progressing to min guard assist with verbal cues   Standing balance support: During functional activity;Bilateral upper extremity supported Standing balance-Leahy Scale: Poor Standing balance comment: min assist with RW                           ADL either performed or assessed with clinical judgement   ADL Overall ADL's : Needs assistance/impaired Eating/Feeding: NPO   Grooming: Wash/dry face;Minimal assistance;Sitting   Upper Body Bathing: Minimal assistance;Standing   Lower Body Bathing: Moderate assistance;Cueing for safety;Sit to/from stand;Cueing for sequencing   Upper Body Dressing : Minimal assistance;Sitting   Lower Body Dressing: Moderate assistance;Sit to/from stand;Cueing for safety;Cueing for sequencing   Toilet Transfer: Minimal assistance;Stand-pivot;Cueing for safety;Cueing for sequencing   Toileting- Clothing Manipulation and Hygiene: Minimal assistance;Sit to/from stand         General ADL Comments: pts family will A as needed per pt     Vision Patient Visual Report: No change from baseline Additional Comments: L eye did not open as wide as R.  Pt able to track with OT, state number of fingers OT was holding up, but eyes did seem very fatigued            Pertinent Vitals/Pain Pain Assessment: No/denies pain     Hand Dominance Right   Extremity/Trunk Assessment Upper Extremity Assessment Upper Extremity Assessment:  Generalized weakness   Lower Extremity Assessment Lower Extremity Assessment: Generalized weakness   Cervical / Trunk Assessment Cervical / Trunk Assessment: Normal   Communication Communication Communication: No difficulties   Cognition Arousal/Alertness: Lethargic Behavior During Therapy: Flat affect Overall Cognitive Status: History of cognitive impairments - at baseline                                 General Comments: Pt maintains eyes closed with occasional opening with max verbal cues, varying responses to PLOF, reports year is 1991, oriented to self and location   General Comments               Home Living Family/patient expects to be discharged to:: Private residence Living Arrangements: Spouse/significant other;Other relatives(Spouse home all day, niece going to school for nursing) Available Help at Discharge: Family Type of Home: House Home Access: Stairs to enter [employer] of Steps: 1-2 Entrance Stairs-Rails: Right;Left;Can reach both Home Layout: Two level;Able to live on main level with bedroom/bathroom     Bathroom Shower/Tub: Tub/shower unit         Home Equipment: Walker - 4 wheels;Cane - single point   Additional Comments: Pt with varying responses, but ultimately reports he ambulates independently with 4wheeled RW      Prior Functioning/Environment Level of Independence: Independent with assistive device(s);Needs assistance  Gait / Transfers Assistance Needed: Per chart review, pt requiring increasing assistance over the last couple of days but pt unable to recall what they assisted him with ADL's / Homemaking Assistance Needed: Per chart review, pt requiring increasing assistance over the last couple of days but pt unable to recall what they assisted him with   Comments: Pt with varying responses, but ultimately reports he ambulates independently with 4wheeled RW around home, independent with bathing and dressing, spouse  performing household chores and driving.        OT Problem List: Decreased strength;Decreased activity tolerance;Impaired balance (sitting and/or standing)      OT Treatment/Interventions:      OT Goals(Current goals can be found in the care plan section) Acute Rehab OT Goals Patient Stated Goal: return home with spouse and family to assist OT Goal Formulation: With patient Time For Goal Achievement: 07/12/19 Potential to Achieve Goals: Good ADL Goals Pt Will Perform Lower Body Bathing: with supervision;sit to/from stand Pt Will Perform Lower Body Dressing: with supervision;sit to/from stand Pt Will Transfer to Toilet: with supervision;bedside commode Pt Will Perform Toileting - Clothing Manipulation and hygiene: with supervision;sit to/from stand  OT Frequency:         Co-evaluation   Reason for Co-Treatment: Necessary to address cognition/behavior during functional activity;For patient/therapist safety;To address functional/ADL transfers PT goals addressed during session: Mobility/safety with mobility;Balance;Proper use of DME;Strengthening/ROM        AM-PAC OT "6 Clicks" Daily Activity     Outcome Measure Help from another person eating meals?: A Little Help from another person taking care of personal grooming?: A Little Help from another person toileting, which includes using toliet, bedpan, or urinal?: A Little Help from another person bathing (including washing, rinsing, drying)?: A  Little Help from another person to put on and taking off regular upper body clothing?: A Little Help from another person to put on and taking off regular lower body clothing?: A Lot 6 Click Score: 17   End of Session Equipment Utilized During Treatment: Rolling walker;Gait belt Nurse Communication: Mobility status  Activity Tolerance: Patient limited by fatigue Patient left: in chair;with call bell/phone within reach;with chair alarm set  OT Visit Diagnosis: Unsteadiness on feet  (R26.81);Other abnormalities of gait and mobility (R26.89);Repeated falls (R29.6);Muscle weakness (generalized) (M62.81)                Time: 0992-7800 OT Time Calculation (min): 26 min Charges:  OT General Charges $OT Visit: 1 Visit OT Evaluation $OT Eval Moderate Complexity: 1 Mod  Lise Auer, OT Acute Rehabilitation Services [phone] Office- [phone]     Braelynn Lupton, Karin Golden D 07/05/2019, 2:54 PM

## 2019-07-06 ENCOUNTER — Other Ambulatory Visit: Payer: Self-pay

## 2019-07-06 DIAGNOSIS — N132 Hydronephrosis with renal and ureteral calculous obstruction: Secondary | ICD-10-CM | POA: Diagnosis present

## 2019-07-06 LAB — BASIC METABOLIC PANEL
Anion gap: 7 (ref 5–15)
BUN: 33 mg/dL — ABNORMAL HIGH (ref 8–23)
CO2: 24 mmol/L (ref 22–32)
Calcium: 8.4 mg/dL — ABNORMAL LOW (ref 8.9–10.3)
Chloride: 110 mmol/L (ref 98–111)
Creatinine, Ser: 1.68 mg/dL — ABNORMAL HIGH (ref 0.61–1.24)
GFR calc Af Amer: 44 mL/min — ABNORMAL LOW (ref >60)
GFR calc non Af Amer: 38 mL/min — ABNORMAL LOW (ref >60)
Glucose, Bld: 138 mg/dL — ABNORMAL HIGH (ref 70–99)
Potassium: 4.3 mmol/L (ref 3.5–5.1)
Sodium: 141 mmol/L (ref 135–145)

## 2019-07-06 LAB — CBC
HCT: 36.4 % — ABNORMAL LOW (ref 39.0–52.0)
Hemoglobin: 11.8 g/dL — ABNORMAL LOW (ref 13.0–17.0)
MCH: 29.1 pg (ref 26.0–34.0)
MCHC: 32.4 g/dL (ref 30.0–36.0)
MCV: 89.9 fL (ref 80.0–100.0)
Platelets: 149 10*3/uL — ABNORMAL LOW (ref 150–400)
RBC: 4.05 MIL/uL — ABNORMAL LOW (ref 4.22–5.81)
RDW: 12.6 % (ref 11.5–15.5)
WBC: 4.7 10*3/uL (ref 4.0–10.5)
nRBC: 0 % (ref 0.0–0.2)

## 2019-07-06 LAB — CULTURE, BLOOD (ROUTINE X 2)

## 2019-07-06 LAB — MAGNESIUM: Magnesium: 2 mg/dL (ref 1.7–2.4)

## 2019-07-06 MED ORDER — CIPROFLOXACIN HCL 500 MG PO TABS: 500.0000 mg | ORAL_TABLET | Freq: Two times a day (BID) | ORAL | 0 refills | Status: AC

## 2019-07-06 MED ORDER — LABETALOL HCL 5 MG/ML IV SOLN: 10.0000 mg | INTRAVENOUS | Status: DC | PRN

## 2019-07-06 MED ORDER — DOXAZOSIN MESYLATE 4 MG PO TABS: 4.0000 mg | ORAL_TABLET | Freq: Every day | ORAL | 0 refills | Status: DC

## 2019-07-06 NOTE — Progress Notes (Signed)
Urology Inpatient Progress Report  Dehydration [E86.0] Lower urinary tract infectious disease [N39.0] AKI (acute kidney injury) ([place]) [N17.9] Constipation [K59.00] Sepsis ([place]) [A41.9]  Procedure(s): CYSTOSCOPY/URETEROSCOPY/STENT PLACEMENT/REROGRADE  1 Day Post-Op   Intv/Subj: No acute events overnight. Patient is without complaint.  More alert today.  Planning to be discharged today.  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 0.9 %  sodium chloride infusion   Intravenous Continuous [place] Eric J, DO   Stopped at 07/06/19 1037  . acetaminophen (TYLENOL) tablet 650 mg  650 mg Oral Q6H PRN Toy Baker, MD   650 mg at 07/04/19 1850   Or  . acetaminophen (TYLENOL) suppository 650 mg  650 mg Rectal Q6H PRN Doutova, Anastassia, MD      . carbidopa-levodopa (SINEMET IR) 25-100 MG per tablet immediate release 1 tablet  1 tablet Oral TID Toy Baker, MD   1 tablet at 07/06/19 1003  . cefTRIAXone (ROCEPHIN) 2 g in sodium chloride 0.9 % 100 mL IVPB  2 g Intravenous Q24H Nilda Simmer, RPH   Stopped at 07/06/19 1037  . doxazosin (CARDURA) tablet 4 mg  4 mg Oral Daily Marton Redwood III, MD   4 mg at 07/06/19 1003  . labetalol (NORMODYNE) injection 10 mg  10 mg Intravenous Q2H PRN Opyd, Ilene Qua, MD      . ondansetron (ZOFRAN) tablet 4 mg  4 mg Oral Q6H PRN Doutova, Anastassia, MD       Or  . ondansetron (ZOFRAN) injection 4 mg  4 mg Intravenous Q6H PRN Doutova, Anastassia, MD      . traZODone (DESYREL) tablet 100 mg  100 mg Oral  QHS Doutova, Anastassia, MD   100 mg at 07/05/19 2025     Objective: Vital: Vitals:   07/05/19 2015 07/06/19 0510 07/06/19 0533 07/06/19 0643  BP: (!) 156/81 (!) 162/110 (!) 168/84 116/61  Pulse: 80 77 81 (!) 55  Resp: 20 18    Temp: 97.6 F (36.4 C) (!) 97.3 F (36.3 C) (!) 97.3 F (36.3 C) (!) 97.5 F (36.4 C)  TempSrc: Oral Oral Oral Oral  SpO2: 98% 100%    Weight:      Height:       I/Os: I/O last 3 completed shifts: In: 3748.3 [P.O.:800; I.V.:2948.3] Out: 751 [Urine:750; Blood:1]  Physical Exam:  General: Patient is in no apparent distress Lungs: Normal respiratory effort, chest expands symmetrically. GI: The abdomen is soft and nontender without mass. Foley: Condom catheter draining clear urine Ext: lower extremities symmetric  Lab Results: Recent Labs    07/04/19 0757 07/05/19 0540 07/06/19 0427  WBC 9.8 7.0 4.7  HGB 11.3* 12.2* 11.8*  HCT 34.1* 37.9* 36.4*   Recent Labs    07/04/19 0757 07/05/19 0540 07/06/19 0427  NA 136 140 141  K 3.6 3.5 4.3  CL 104 104 110  CO2 25 23 24   GLUCOSE 116* 103* 138*  BUN 44* 37* 33*  CREATININE 2.38* 2.06* 1.68*  CALCIUM 8.4* 8.1* 8.4*   Recent Labs    07/03/19 1350  INR 1.4*   No  results for input(s): LABURIN in the last 72 hours. Results for orders placed or performed during the hospital encounter of 07/03/19  Blood Culture (routine x 2)     Status: None (Preliminary result)   Collection Time: 07/03/19  1:30 PM   Specimen: BLOOD  Result Value Ref Range Status   Specimen Description   Final    BLOOD RIGHT ANTECUBITAL Performed at [facility], [address] [place], [place] [address]    Special Requests   Final    BOTTLES DRAWN AEROBIC AND ANAEROBIC Blood Culture adequate volume Performed at [facility], [address] [place], [place] [address]    Culture  Setup Time   Final    GRAM NEGATIVE RODS AEROBIC BOTTLE ONLY CRITICAL VALUE NOTED.  VALUE IS  CONSISTENT WITH PREVIOUSLY REPORTED AND CALLED VALUE. Performed at [facility], [address], [place], [place] [address]    Culture GRAM NEGATIVE RODS  Final   Report Status PENDING  Incomplete  Blood Culture (routine x 2)     Status: Abnormal   Collection Time: 07/03/19  2:22 PM   Specimen: BLOOD LEFT HAND  Result Value Ref Range Status   Specimen Description   Final    BLOOD LEFT HAND Performed at [facility], [address], [place], [place] [address]    Special Requests   Final    BOTTLES DRAWN AEROBIC AND ANAEROBIC Blood Culture results may not be optimal due to an inadequate volume of blood received in culture bottles Performed at [facility], [address], [place], [place] [address]    Culture  Setup Time   Final    GRAM NEGATIVE RODS IN BOTH AEROBIC AND ANAEROBIC BOTTLES CRITICAL RESULT CALLED TO, READ BACK BY AND VERIFIED WITH: L. POINDEXTER, PHARMD (WL) AT 1919 ON 07/04/19 BY C. JESSUP, MT. Performed at [facility], [address] [place], [place] [address]    Culture KLEBSIELLA PNEUMONIAE (A)  Final   Report Status 07/06/2019 FINAL  Final   Organism ID, Bacteria KLEBSIELLA PNEUMONIAE  Final      Susceptibility   Klebsiella pneumoniae - MIC*    AMPICILLIN >=32 RESISTANT Resistant     CEFAZOLIN <=4 SENSITIVE Sensitive     CEFEPIME <=0.12 SENSITIVE Sensitive     CEFTAZIDIME <=1 SENSITIVE Sensitive     CEFTRIAXONE <=0.25 SENSITIVE Sensitive     CIPROFLOXACIN <=0.25 SENSITIVE Sensitive     GENTAMICIN <=1 SENSITIVE Sensitive     IMIPENEM <=0.25 SENSITIVE Sensitive     TRIMETH/SULFA <=20 SENSITIVE Sensitive     AMPICILLIN/SULBACTAM 4 SENSITIVE Sensitive     PIP/TAZO <=4 SENSITIVE Sensitive     * KLEBSIELLA PNEUMONIAE  Blood Culture ID Panel (Reflexed)     Status: Abnormal   Collection Time: 07/03/19  2:22 PM  Result Value Ref Range Status   Enterococcus species NOT DETECTED NOT DETECTED Final   Listeria  monocytogenes NOT DETECTED NOT DETECTED Final   Staphylococcus species NOT DETECTED NOT DETECTED Final   Staphylococcus aureus (BCID) NOT DETECTED NOT DETECTED Final   Streptococcus species NOT DETECTED NOT DETECTED Final   Streptococcus agalactiae NOT DETECTED NOT DETECTED Final   Streptococcus pneumoniae NOT DETECTED NOT DETECTED Final   Streptococcus pyogenes NOT DETECTED NOT DETECTED Final   Acinetobacter baumannii NOT DETECTED NOT DETECTED Final   Enterobacteriaceae species DETECTED (A) NOT DETECTED Final    Comment: Enterobacteriaceae represent a large family of gram-negative bacteria, not a  single organism. CRITICAL RESULT CALLED TO, READ BACK BY AND VERIFIED WITH: L. POINDEXTER, PHARMD (WL) AT 1919 ON 07/04/19 BY C. JESSUP, MT.    Enterobacter cloacae complex NOT DETECTED NOT DETECTED Final   Escherichia coli NOT DETECTED NOT DETECTED Final   Klebsiella oxytoca NOT DETECTED NOT DETECTED Final   Klebsiella pneumoniae DETECTED (A) NOT DETECTED Final    Comment: CRITICAL RESULT CALLED TO, READ BACK BY AND VERIFIED WITH: L. POINDEXTER, PHARMD (WL) AT 1919 ON 07/04/19 BY C. JESSUP, MT.    Proteus species NOT DETECTED NOT DETECTED Final   Serratia marcescens NOT DETECTED NOT DETECTED Final   Carbapenem resistance NOT DETECTED NOT DETECTED Final   Haemophilus influenzae NOT DETECTED NOT DETECTED Final   Neisseria meningitidis NOT DETECTED NOT DETECTED Final   Pseudomonas aeruginosa NOT DETECTED NOT DETECTED Final   Candida albicans NOT DETECTED NOT DETECTED Final   Candida glabrata NOT DETECTED NOT DETECTED Final   Candida krusei NOT DETECTED NOT DETECTED Final   Candida parapsilosis NOT DETECTED NOT DETECTED Final   Candida tropicalis NOT DETECTED NOT DETECTED Final    Comment: Performed at [facility], [address], [place], [place] [address]  Respiratory Panel by RT PCR (Flu A&B, Covid) - Nasopharyngeal Swab     Status: None   Collection Time: 07/03/19  3:23 PM    Specimen: Nasopharyngeal Swab  Result Value Ref Range Status   SARS Coronavirus 2 by RT PCR NEGATIVE NEGATIVE Final    Comment: (NOTE) SARS-CoV-2 target nucleic acids are NOT DETECTED. The SARS-CoV-2 RNA is generally detectable in upper respiratoy specimens during the acute phase of infection. The lowest concentration of SARS-CoV-2 viral copies this assay can detect is 131 copies/mL. A negative result does not preclude SARS-Cov-2 infection and should not be used as the sole basis for treatment or other patient management decisions. A negative result may occur with  improper specimen collection/handling, submission of specimen other than nasopharyngeal swab, presence of viral mutation(s) within the areas targeted by this assay, and inadequate number of viral copies (<131 copies/mL). A negative result must be combined with clinical observations, patient history, and epidemiological information. The expected result is Negative. Fact Sheet for Patients:  https://www.moore.com/ Fact Sheet for Healthcare Providers:  https://www.young.biz/ This test is not yet ap proved or cleared by the [place] FDA and  has been authorized for detection and/or diagnosis of SARS-CoV-2 by FDA under an Emergency Use Authorization (EUA). This EUA will remain  in effect (meaning this test can be used) for the duration of the COVID-19 declaration under Section 564(b)(1) of the Act, 21 U.S.C. section 360bbb-3(b)(1), unless the authorization is terminated or revoked sooner.    Influenza A by PCR NEGATIVE NEGATIVE Final   Influenza B by PCR NEGATIVE NEGATIVE Final    Comment: (NOTE) The Xpert Xpress SARS-CoV-2/FLU/RSV assay is intended as an aid in  the diagnosis of influenza from Nasopharyngeal swab specimens and  should not be used as a sole basis for treatment. Nasal washings and  aspirates are unacceptable for Xpert Xpress SARS-CoV-2/FLU/RSV  testing. Fact Sheet  for Patients: https://www.moore.com/ Fact Sheet for Healthcare Providers: https://www.young.biz/ This test is not yet approved or cleared by the [place] FDA and  has been authorized for detection and/or diagnosis of SARS-CoV-2 by  FDA under an Emergency Use Authorization (EUA). This EUA will remain  in effect (meaning this test can be used) for the duration of the  Covid-19 declaration under Section 564(b)(1) of the Act,  21  U.S.C. section 360bbb-3(b)(1), unless the authorization is  terminated or revoked. Performed at [facility], [address] [place], [place] [address]   Urine culture     Status: Abnormal   Collection Time: 07/03/19  4:18 PM   Specimen: In/Out Cath Urine  Result Value Ref Range Status   Specimen Description   Final    IN/OUT CATH URINE Performed at [facility], [address] [place], [place] [address]    Special Requests   Final    NONE Performed at [facility], [address] [place], [place] [address]    Culture >=100,000 COLONIES/mL KLEBSIELLA PNEUMONIAE (A)  Final   Report Status 07/05/2019 FINAL  Final   Organism ID, Bacteria KLEBSIELLA PNEUMONIAE (A)  Final      Susceptibility   Klebsiella pneumoniae - MIC*    AMPICILLIN >=32 RESISTANT Resistant     CEFAZOLIN <=4 SENSITIVE Sensitive     CEFTRIAXONE <=0.25 SENSITIVE Sensitive     CIPROFLOXACIN <=0.25 SENSITIVE Sensitive     GENTAMICIN <=1 SENSITIVE Sensitive     IMIPENEM <=0.25 SENSITIVE Sensitive     NITROFURANTOIN 64 INTERMEDIATE Intermediate     TRIMETH/SULFA <=20 SENSITIVE Sensitive     AMPICILLIN/SULBACTAM 4 SENSITIVE Sensitive     PIP/TAZO <=4 SENSITIVE Sensitive     * >=100,000 COLONIES/mL KLEBSIELLA PNEUMONIAE  MRSA PCR Screening     Status: None   Collection Time: 07/04/19  5:06 AM   Specimen: Nasal Mucosa; Nasopharyngeal  Result Value Ref Range Status   MRSA by PCR NEGATIVE  NEGATIVE Final    Comment:        The GeneXpert MRSA Assay (FDA approved for NASAL specimens only), is one component of a comprehensive MRSA colonization surveillance program. It is not intended to diagnose MRSA infection nor to guide or monitor treatment for MRSA infections. Performed at [facility], [address] [place], [place] [address]   Surgical pcr screen     Status: None   Collection Time: 07/05/19 11:43 AM   Specimen: Nasal Mucosa; Nasal Swab  Result Value Ref Range Status   MRSA, PCR NEGATIVE NEGATIVE Final   Staphylococcus aureus NEGATIVE NEGATIVE Final    Comment: (NOTE) The Xpert SA Assay (FDA approved for NASAL specimens in patients 80 years of age and older), is one component of a comprehensive surveillance program. It is not intended to diagnose infection nor to guide or monitor treatment. Performed at [facility], [address] [place], [place] [address]     Studies/Results: US RENAL  Result Date: 07/05/2019 CLINICAL DATA:  Hydronephrosis EXAM: RENAL / URINARY TRACT ULTRASOUND COMPLETE COMPARISON:  07/03/2019 FINDINGS: Right Kidney: Renal measurements: 12.2 x 5.2 x 4.6 cm = volume: 150.9 mL . Echogenicity within normal limits. No mass or hydronephrosis visualized. Left Kidney: Renal measurements: 11.3 x 6.4 x 6.4 cm = volume: 244.9 mL. Echogenicity within normal limits. Mild hydronephrosis is again identified. No mass visualized. Bladder: Appears normal for degree of bladder distention. Other: None. IMPRESSION: Mild left hydronephrosis similar to prior study. Electronically Signed   By: Guadlupe SpanishPraneil  Patel M.D.   On: 07/05/2019 10:34   DG C-Arm 1-60 Min-No Report  Result Date: 07/05/2019 Fluoroscopy was utilized by the requesting physician.  No radiographic interpretation.    Assessment: Left renal calculus, urinary tract infection Procedure(s): CYSTOSCOPY/URETEROSCOPY/STENT PLACEMENT/REROGRADE, 1 Day Post-Op  doing  well.  Plan: Follow-up in 1 week for stent removal   Modena SlaterEugene Pinky Ravan,  MD Urology 07/06/2019, 12:29 PM

## 2019-07-06 NOTE — Discharge Summary (Addendum)
Physician Discharge Summary  [name] QMV:[id number] DOB: 17-Jun-1941 DOA: 07/03/2019  PCP: [name], MD  Admit date: 07/03/2019 Discharge date: 07/06/2019  Admitted From: Home Disposition: Home  Recommendations for Outpatient Follow-up:  1. Follow up with PCP in 1-2 weeks 2. Follow-up with urology, Dr. Alvester Morin in 1 week for stent removal 3. Continue ciprofloxacin 500 mg p.o. twice daily to complete a 14-day course for complicated UTI, Klebsiella pneumonia septicemia 4. Started on Cardura by urology 5. Discontinued home amlodipine, ACE inhibitor, furosemide secondary to borderline blood pressures and acute renal failure, recommend follow-up blood pressure and creatinine level prior to restart 6. Please obtain BMP to ensure renal function has improved back to his normal baseline   Home Health: Yes PT/OT Equipment/Devices: 3 in 1 bedside commode  Discharge Condition: Stable CODE STATUS: DNR  Diet recommendation: Heart healthy, low-salt diet  History of present illness:  [name] is a 79 year old Caucasian male with past medical history remarkable for dementia, Parkinson's disease, HTN, HLD, frequent falls and history of previous UTIs who presented to the ED with confusion and lethargy.  Reports that he has had this strong smelling urine which is atypical symptom/sign for recurrent UTI.  He is also been having a difficult time ambulating with progressive weakness over the past 4 days, now requiring assistance with ambulation.  Upon EMS arrival, he was noted to be febrile up to 101.0 and was given Tylenol.  Per family patient have not had any cough no congestion or shortness of breath no nausea no vomiting no diarrhea has not been complaining about back pain abdominal pain his blood pressure has been running a bit low so his wife did not give him any blood pressure medications lately. Denies any known Covid contacts.  In the ED, noted temperature 102.0.  HR 114, RR 20, BP  100/54, with SPO2 94% on room air.  Sodium 139, potassium 3.9, chloride 105, CO2 26, glucose 154, BUN 48, creatinine 3.09.  WBC count 12.0, hemoglobin 12.5, platelets 120.  Influenza negative.  Covid-19/SARS-CoV-2 negative.  Urinalysis with small leukoesterase, negative nitrite, many bacteria, 21-50 WBCs.  Patient referred by ED physician for admission given sepsis and further evaluation and treatment.  Hospital course:  Sepsis, present on admission Klebsiella pneumonia septicemia/UTI Left hydronephrosis with obstructing stone Patient presenting from home with weakness, lethargy over the past 4 days with foul-smelling urine.  History of recurrent UTIs.  On presentation he was noted to be septic with elevated heart rate, elevated respiratory rate, fever and endorgan damage with renal failure.  Lactic acid 0.9 with procalcitonin elevated 4.18.  Ultrasound renal with mild left hydronephrosis.  CT abdomen/pelvis with left-sided obstructive uropathy with 2 mm stone in the left UV junction with mild hydroutero nephrosis.  Blood culture 1/2+ GNR's with BCID remarkable for Klebsiella pneumonia.  Repeat renal ultrasound shows persistent mild left hydronephrosis.  Patient was started on IV ceftriaxone with resolution of leukocytosis.  Seen by urology underwent cystoscopy with stone extraction and stent placement on 07/06/2019.  Will discharge home with ciprofloxacin 500 mg p.o. twice daily to complete a 14-day course.  Will need follow-up with urology 1 week for stent removal.  Continue doxazosin 4 mg p.o. daily.  Acute renal failure Creatinine 3.09 on admission.  Etiology likely secondary to prerenal azotemia from severe dehydration versus ATN from underlying septicemia/UTI.  Urine sodium 45, urine creatinine 222, FeNa 0.5% consistent with prerenal.  Patient was started on IV fluid hydration.  Creatinine improved from 3.09  to 1.68 at time of discharge.  We will continue to hold home ACE inhibitor and loop diuretic.   Recommend repeat BMP in 1 week to ensure creatinine back to baseline; prior to restarting home ACE inhibitor and diuretic.  Parkinson's dementia Continue Sinemet 25-200 mg p.o. 3 times daily, Trazodone 100 mg p.o. nightly  Essential hypertension BP 116/61, this morning, well controlled. Continue to hold home amlodipine-benazepril 5-20 mg p.o. daily, also holding home furosemide 40 mg p.o. daily that he uses prn until follows up with PCP for repeat blood pressure check and BMP to evaluate creatinine level.  Frequent falls: Evaluated by PT/OT while inpatient.  Recommend home health on discharge.  Discharge Diagnoses:  Principal Problem:   Hydronephrosis with renal and ureteral calculus obstruction Active Problems:   Essential hypertension   Dyslipidemia   Falls frequently   ARF (acute renal failure) (HCC)   Sepsis (HCC)   Dementia due to Parkinson's disease without behavioral disturbance (HCC)   CKD (chronic kidney disease), stage III   Constipation   Acute metabolic encephalopathy    Discharge Instructions  Discharge Instructions    Call MD for:  difficulty breathing, headache or visual disturbances   Complete by: As directed    Call MD for:  extreme fatigue   Complete by: As directed    Call MD for:  persistant dizziness or light-headedness   Complete by: As directed    Call MD for:  persistant nausea and vomiting   Complete by: As directed    Call MD for:  severe uncontrolled pain   Complete by: As directed    Call MD for:  temperature >100.4   Complete by: As directed    Diet - low sodium heart healthy   Complete by: As directed    Increase activity slowly   Complete by: As directed      Allergies as of 07/06/2019      Reactions   Amlodipine    Edema w/10 mg   Atorvastatin    REACTION: leg cramps   Penicillins Hives   Has patient had a PCN reaction causing immediate rash, facial/tongue/throat swelling, SOB or lightheadedness with hypotension: Yes Has  patient had a PCN reaction causing severe rash involving mucus membranes or skin necrosis: No Has patient had a PCN reaction that required hospitalization No Has patient had a PCN reaction occurring within the last 10 years: No If all of the above answers are "NO", then may proceed with Cephalosporin use.   Sulfonamide Derivatives Hives   Telmisartan    ?fatigue   Sulfa Antibiotics Other (See Comments), Palpitations   Sweating, passed out      Medication List    STOP taking these medications   acetaminophen 325 MG tablet Commonly known as: TYLENOL   amLODipine 5 MG tablet Commonly known as: NORVASC   amLODipine-benazepril 5-20 MG capsule Commonly known as: LOTREL   furosemide 40 MG tablet Commonly known as: LASIX   potassium chloride 8 MEQ tablet Commonly known as: KLOR-CON     TAKE these medications   B-complex with vitamin C tablet Take 1 tablet by mouth daily.   carbidopa-levodopa 25-100 MG tablet Commonly known as: SINEMET IR Take 1 tablet three times a day with meals What changed:   how much to take  how to take this  when to take this  additional instructions   ciprofloxacin 500 MG tablet Commonly known as: CIPRO Take 1 tablet (500 mg total) by mouth 2 (two) times daily for  11 days.   cyanocobalamin 1000 MCG/ML injection Commonly known as: (VITAMIN B-12) 1 ml sq for 5 days, then 1 ml weekly for 4 weeks, then 1 ml every 2 weeks What changed:   how much to take  how to take this  when to take this   doxazosin 4 MG tablet Commonly known as: CARDURA Take 1 tablet (4 mg total) by mouth daily. Start taking on: July 07, 2019   loratadine-pseudoephedrine 10-240 MG 24 hr tablet Commonly known as: CLARITIN-D 24-hour Take 1 tablet by mouth daily as needed for allergies.   SYRINGE-NEEDLE (DISP) 3 ML 25G X 1" 3 ML Misc Commonly known as: BD Eclipse Syringe Use sq as directed   traZODone 50 MG tablet Commonly known as: DESYREL TAKE 2 TABLETS BY  MOUTH EVERY DAY AT BEDTIME What changed:   how much to take  how to take this  when to take this  additional instructions   Vitamin D 1000 units capsule Take 1 capsule (1,000 Units total) by mouth daily.      Follow-up Information    Plotnikov, Georgina Quint, MD. Schedule an appointment as soon as possible for a visit in 1 week(s).   Specialty: Internal Medicine Contact information: [address] [place] [address] [phone]        Ray Church III, MD. Schedule an appointment as soon as possible for a visit in 1 week(s).   Specialty: Urology Contact information: [address] [place] [address] [phone]          Allergies  Allergen Reactions  . Amlodipine     Edema w/10 mg  . Atorvastatin     REACTION: leg cramps  . Penicillins Hives    Has patient had a PCN reaction causing immediate rash, facial/tongue/throat swelling, SOB or lightheadedness with hypotension: Yes Has patient had a PCN reaction causing severe rash involving mucus membranes or skin necrosis: No Has patient had a PCN reaction that required hospitalization No Has patient had a PCN reaction occurring within the last 10 years: No If all of the above answers are "NO", then may proceed with Cephalosporin use.   . Sulfonamide Derivatives Hives  . Telmisartan     ?fatigue  . Sulfa Antibiotics Other (See Comments) and Palpitations    Sweating, passed out    Consultations:  Urology - Dr. Alvester Morin   Procedures/Studies: CT ABDOMEN PELVIS WO CONTRAST  Result Date: 07/04/2019 CLINICAL DATA:  Constipation or obstruction. EXAM: CT ABDOMEN AND PELVIS WITHOUT CONTRAST TECHNIQUE: Multidetector CT imaging of the abdomen and pelvis was performed following the standard protocol without IV contrast. COMPARISON:  None. FINDINGS: LOWER CHEST: Bibasilar atelectasis. HEPATOBILIARY: Normal hepatic contours. No intra- or extrahepatic biliary dilatation. Normal gallbladder. PANCREAS: Normal  pancreas. No ductal dilatation or peripancreatic fluid collection. SPLEEN: Normal. ADRENALS/URINARY TRACT: The adrenal glands are normal. There is a stone within the distal left ureter, at the ureterovesical junction, measuring 2 mm, causing mild hydroureteronephrosis and mild perinephric stranding. The right kidney is normal. The urinary bladder is normal for degree of distention STOMACH/BOWEL: There is no hiatal hernia. Normal duodenal course and caliber. No small bowel dilatation or inflammation. No focal colonic abnormality. Normal appendix. VASCULAR/LYMPHATIC: There is calcific atherosclerosis of the abdominal aorta. No abdominal or pelvic lymphadenopathy. REPRODUCTIVE: Normal prostate size with symmetric seminal vesicles. MUSCULOSKELETAL. Multilevel degenerative disc disease and facet arthrosis. No bony spinal canal stenosis. OTHER: None. IMPRESSION: 1. Left-sided obstructive uropathy with 2 mm stone at the left ureterovesical junction  causing mild hydroureteronephrosis and perinephric stranding. 2.  Aortic atherosclerosis (ICD10-I70.0). Electronically Signed   By: Deatra Robinson M.D.   On: 07/04/2019 01:56   DG Abd 1 View  Result Date: 07/03/2019 CLINICAL DATA:  Constipation EXAM: ABDOMEN - 1 VIEW COMPARISON:  None. FINDINGS: Mild diffuse gaseous distention of bowel, likely ileus. No significant increase in stool burden. No organomegaly or free air. No acute bony abnormality. IMPRESSION: Diffuse gaseous distention of bowel, likely ileus. Electronically Signed   By: Charlett Nose M.D.   On: 07/03/2019 20:17   US RENAL  Result Date: 07/05/2019 CLINICAL DATA:  Hydronephrosis EXAM: RENAL / URINARY TRACT ULTRASOUND COMPLETE COMPARISON:  07/03/2019 FINDINGS: Right Kidney: Renal measurements: 12.2 x 5.2 x 4.6 cm = volume: 150.9 mL . Echogenicity within normal limits. No mass or hydronephrosis visualized. Left Kidney: Renal measurements: 11.3 x 6.4 x 6.4 cm = volume: 244.9 mL. Echogenicity within normal limits.  Mild hydronephrosis is again identified. No mass visualized. Bladder: Appears normal for degree of bladder distention. Other: None. IMPRESSION: Mild left hydronephrosis similar to prior study. Electronically Signed   By: Guadlupe Spanish M.D.   On: 07/05/2019 10:34   US Renal  Result Date: 07/03/2019 CLINICAL DATA:  79 year old male with acute renal insufficiency. EXAM: RENAL / URINARY TRACT ULTRASOUND COMPLETE COMPARISON:  Renal ultrasound dated 02/13/2019. FINDINGS: Right Kidney: Renal measurements: 12.6 x 5.1 x 5.1 cm = volume: 172 mL. Mild increased echogenicity. No hydronephrosis or shadowing stone. Left Kidney: Renal measurements: 12.1 x 6.5 x 7.0 cm = volume: 287 mL. Mildly echogenic. There is mild left hydronephrosis. No shadowing stone. Bladder: The urinary bladder is partially distended and grossly unremarkable. Other: None. IMPRESSION: 1. Mild increased renal echogenicity may be related to chronic kidney disease. 2. Mild left hydronephrosis.  No shadowing stone. Electronically Signed   By: Elgie Collard M.D.   On: 07/03/2019 20:49   DG Chest Port 1 View  Result Date: 07/03/2019 CLINICAL DATA:  Weakness.  Hypertension. EXAM: PORTABLE CHEST 1 VIEW COMPARISON:  February 13, 2019 FINDINGS: There is left base scarring. Lungs elsewhere are clear. Heart is upper normal in size with pulmonary vascularity normal. No adenopathy. No bone lesions. IMPRESSION: Scarring left base, stable. Lungs elsewhere clear. Heart upper normal in size. No adenopathy. Electronically Signed   By: Bretta Bang III M.D.   On: 07/03/2019 13:52   DG C-Arm 1-60 Min-No Report  Result Date: 07/05/2019 Fluoroscopy was utilized by the requesting physician.  No radiographic interpretation.      Subjective: Patient seen and examined at bedside, resting comfortably.  He states he is thirsty and requesting water.  Underwent cystoscopy yesterday with extraction of urethral stone.  Creatinine improved.  No other complaints or  concerns at this time.  Denies headache, no chest pain, palpitations, no shortness of breath, no abdominal pain.  Ready for discharge home, updated patient spouse via telephone.  No acute concerns per nursing staff.  Discharge Exam: Vitals:   07/06/19 0533 07/06/19 0643  BP: (!) 168/84 116/61  Pulse: 81 (!) 55  Resp:    Temp: (!) 97.3 F (36.3 C) (!) 97.5 F (36.4 C)  SpO2:     Vitals:   07/05/19 2015 07/06/19 0510 07/06/19 0533 07/06/19 0643  BP: (!) 156/81 (!) 162/110 (!) 168/84 116/61  Pulse: 80 77 81 (!) 55  Resp: 20 18    Temp: 97.6 F (36.4 C) (!) 97.3 F (36.3 C) (!) 97.3 F (36.3 C) (!) 97.5 F (36.4 C)  TempSrc: Oral Oral Oral Oral  SpO2: 98% 100%    Weight:      Height:        General: Pt is alert, awake, not in acute distress, pleasantly confused Cardiovascular: RRR, S1/S2 +, no rubs, no gallops Respiratory: CTA bilaterally, no wheezing, no rhonchi Abdominal: Soft, NT, ND, bowel sounds + Extremities: no edema, no cyanosis    The results of significant diagnostics from this hospitalization (including imaging, microbiology, ancillary and laboratory) are listed below for reference.     Microbiology: Recent Results (from the past 240 hour(s))  Blood Culture (routine x 2)     Status: None (Preliminary result)   Collection Time: 07/03/19  1:30 PM   Specimen: BLOOD  Result Value Ref Range Status   Specimen Description   Final    BLOOD RIGHT ANTECUBITAL Performed at [facility], [address] [place], [place] [address]    Special Requests   Final    BOTTLES DRAWN AEROBIC AND ANAEROBIC Blood Culture adequate volume Performed at [facility], [address] [place], [place] [address]    Culture  Setup Time   Final    GRAM NEGATIVE RODS AEROBIC BOTTLE ONLY CRITICAL VALUE NOTED.  VALUE IS CONSISTENT WITH PREVIOUSLY REPORTED AND CALLED VALUE. Performed at [facility], [address], [place], [place] [address]     Culture GRAM NEGATIVE RODS  Final   Report Status PENDING  Incomplete  Blood Culture (routine x 2)     Status: Abnormal   Collection Time: 07/03/19  2:22 PM   Specimen: BLOOD LEFT HAND  Result Value Ref Range Status   Specimen Description   Final    BLOOD LEFT HAND Performed at [facility], [address] [place], [place] [address]    Special Requests   Final    BOTTLES DRAWN AEROBIC AND ANAEROBIC Blood Culture results may not be optimal due to an inadequate volume of blood received in culture bottles Performed at [facility], [address], [place], [place] [address]    Culture  Setup Time   Final    GRAM NEGATIVE RODS IN BOTH AEROBIC AND ANAEROBIC BOTTLES CRITICAL RESULT CALLED TO, READ BACK BY AND VERIFIED WITH: L. POINDEXTER, PHARMD (WL) AT 1919 ON 07/04/19 BY C. JESSUP, MT. Performed at [facility], [address] [place], [place] [address]    Culture KLEBSIELLA PNEUMONIAE (A)  Final   Report Status 07/06/2019 FINAL  Final   Organism ID, Bacteria KLEBSIELLA PNEUMONIAE  Final      Susceptibility   Klebsiella pneumoniae - MIC*    AMPICILLIN >=32 RESISTANT Resistant     CEFAZOLIN <=4 SENSITIVE Sensitive     CEFEPIME <=0.12 SENSITIVE Sensitive     CEFTAZIDIME <=1 SENSITIVE Sensitive     CEFTRIAXONE <=0.25 SENSITIVE Sensitive     CIPROFLOXACIN <=0.25 SENSITIVE Sensitive     GENTAMICIN <=1 SENSITIVE Sensitive     IMIPENEM <=0.25 SENSITIVE Sensitive     TRIMETH/SULFA <=20 SENSITIVE Sensitive     AMPICILLIN/SULBACTAM 4 SENSITIVE Sensitive     PIP/TAZO <=4 SENSITIVE Sensitive     * KLEBSIELLA PNEUMONIAE  Blood Culture ID Panel (Reflexed)     Status: Abnormal   Collection Time: 07/03/19  2:22 PM  Result Value Ref Range Status   Enterococcus species NOT DETECTED NOT DETECTED Final   Listeria monocytogenes NOT DETECTED NOT DETECTED Final   Staphylococcus species NOT DETECTED NOT DETECTED Final  Staphylococcus aureus (BCID) NOT  DETECTED NOT DETECTED Final   Streptococcus species NOT DETECTED NOT DETECTED Final   Streptococcus agalactiae NOT DETECTED NOT DETECTED Final   Streptococcus pneumoniae NOT DETECTED NOT DETECTED Final   Streptococcus pyogenes NOT DETECTED NOT DETECTED Final   Acinetobacter baumannii NOT DETECTED NOT DETECTED Final   Enterobacteriaceae species DETECTED (A) NOT DETECTED Final    Comment: Enterobacteriaceae represent a large family of gram-negative bacteria, not a single organism. CRITICAL RESULT CALLED TO, READ BACK BY AND VERIFIED WITH: L. POINDEXTER, PHARMD (WL) AT 1919 ON 07/04/19 BY C. JESSUP, MT.    Enterobacter cloacae complex NOT DETECTED NOT DETECTED Final   Escherichia coli NOT DETECTED NOT DETECTED Final   Klebsiella oxytoca NOT DETECTED NOT DETECTED Final   Klebsiella pneumoniae DETECTED (A) NOT DETECTED Final    Comment: CRITICAL RESULT CALLED TO, READ BACK BY AND VERIFIED WITH: L. POINDEXTER, PHARMD (WL) AT 1919 ON 07/04/19 BY C. JESSUP, MT.    Proteus species NOT DETECTED NOT DETECTED Final   Serratia marcescens NOT DETECTED NOT DETECTED Final   Carbapenem resistance NOT DETECTED NOT DETECTED Final   Haemophilus influenzae NOT DETECTED NOT DETECTED Final   Neisseria meningitidis NOT DETECTED NOT DETECTED Final   Pseudomonas aeruginosa NOT DETECTED NOT DETECTED Final   Candida albicans NOT DETECTED NOT DETECTED Final   Candida glabrata NOT DETECTED NOT DETECTED Final   Candida krusei NOT DETECTED NOT DETECTED Final   Candida parapsilosis NOT DETECTED NOT DETECTED Final   Candida tropicalis NOT DETECTED NOT DETECTED Final    Comment: Performed at [facility], [address] [place], [place] [address]  Respiratory Panel by RT PCR (Flu A&B, Covid) - Nasopharyngeal Swab     Status: None   Collection Time: 07/03/19  3:23 PM   Specimen: Nasopharyngeal Swab  Result Value Ref Range Status   SARS Coronavirus 2 by RT PCR NEGATIVE NEGATIVE Final    Comment:  (NOTE) SARS-CoV-2 target nucleic acids are NOT DETECTED. The SARS-CoV-2 RNA is generally detectable in upper respiratoy specimens during the acute phase of infection. The lowest concentration of SARS-CoV-2 viral copies this assay can detect is 131 copies/mL. A negative result does not preclude SARS-Cov-2 infection and should not be used as the sole basis for treatment or other patient management decisions. A negative result may occur with  improper specimen collection/handling, submission of specimen other than nasopharyngeal swab, presence of viral mutation(s) within the areas targeted by this assay, and inadequate number of viral copies (<131 copies/mL). A negative result must be combined with clinical observations, patient history, and epidemiological information. The expected result is Negative. Fact Sheet for Patients:  https://www.moore.com/https://www.fda.gov/media/142436/download Fact Sheet for Healthcare Providers:  https://www.young.biz/https://www.fda.gov/media/142435/download This test is not yet ap proved or cleared by the [place] FDA and  has been authorized for detection and/or diagnosis of SARS-CoV-2 by FDA under an Emergency Use Authorization (EUA). This EUA will remain  in effect (meaning this test can be used) for the duration of the COVID-19 declaration under Section 564(b)(1) of the Act, 21 U.S.C. section 360bbb-3(b)(1), unless the authorization is terminated or revoked sooner.    Influenza A by PCR NEGATIVE NEGATIVE Final   Influenza B by PCR NEGATIVE NEGATIVE Final    Comment: (NOTE) The Xpert Xpress SARS-CoV-2/FLU/RSV assay is intended as an aid in  the diagnosis of influenza from Nasopharyngeal swab specimens and  should not be used as a sole basis for treatment. Nasal washings and  aspirates are unacceptable for  Xpert Xpress SARS-CoV-2/FLU/RSV  testing. Fact Sheet for Patients: PinkCheek.be Fact Sheet for Healthcare  Providers: GravelBags.it This test is not yet approved or cleared by the [place] FDA and  has been authorized for detection and/or diagnosis of SARS-CoV-2 by  FDA under an Emergency Use Authorization (EUA). This EUA will remain  in effect (meaning this test can be used) for the duration of the  Covid-19 declaration under Section 564(b)(1) of the Act, 21  U.S.C. section 360bbb-3(b)(1), unless the authorization is  terminated or revoked. Performed at [facility], [place] [address] [place], [place] [address]   Urine culture     Status: Abnormal   Collection Time: 07/03/19  4:18 PM   Specimen: In/Out Cath Urine  Result Value Ref Range Status   Specimen Description   Final    IN/OUT CATH URINE Performed at [place] [address] [place], [place] [address]    Special Requests   Final    NONE Performed at [facility], [place] [address], [place], [place] [address]    Culture >=100,000 COLONIES/mL KLEBSIELLA PNEUMONIAE (A)  Final   Report Status 07/05/2019 FINAL  Final   Organism ID, Bacteria KLEBSIELLA PNEUMONIAE (A)  Final      Susceptibility   Klebsiella pneumoniae - MIC*    AMPICILLIN >=32 RESISTANT Resistant     CEFAZOLIN <=4 SENSITIVE Sensitive     CEFTRIAXONE <=0.25 SENSITIVE Sensitive     CIPROFLOXACIN <=0.25 SENSITIVE Sensitive     GENTAMICIN <=1 SENSITIVE Sensitive     IMIPENEM <=0.25 SENSITIVE Sensitive     NITROFURANTOIN 64 INTERMEDIATE Intermediate     TRIMETH/SULFA <=20 SENSITIVE Sensitive     AMPICILLIN/SULBACTAM 4 SENSITIVE Sensitive     PIP/TAZO <=4 SENSITIVE Sensitive     * >=100,000 COLONIES/mL KLEBSIELLA PNEUMONIAE  MRSA PCR Screening     Status: None   Collection Time: 07/04/19  5:06 AM   Specimen: Nasal Mucosa; Nasopharyngeal  Result Value Ref Range Status   MRSA by PCR NEGATIVE NEGATIVE Final    Comment:        The GeneXpert MRSA Assay (FDA approved for NASAL  specimens only), is one component of a comprehensive MRSA colonization surveillance program. It is not intended to diagnose MRSA infection nor to guide or monitor treatment for MRSA infections. Performed at [facility], [place] [address] [place], [place] [address]   Surgical pcr screen     Status: None   Collection Time: 07/05/19 11:43 AM   Specimen: Nasal Mucosa; Nasal Swab  Result Value Ref Range Status   MRSA, PCR NEGATIVE NEGATIVE Final   Staphylococcus aureus NEGATIVE NEGATIVE Final    Comment: (NOTE) The Xpert SA Assay (FDA approved for NASAL specimens in patients 33 years of age and older), is one component of a comprehensive surveillance program. It is not intended to diagnose infection nor to guide or monitor treatment. Performed at [facility], [place] [address] [place], [place] [address]      Labs: BNP (last 3 results) No results for input(s): BNP in the last 8760 hours. Basic Metabolic Panel: Recent Labs  Lab 07/03/19 1350 07/04/19 0757 07/05/19 0540 07/06/19 0427  NA 139 136 140 141  K 3.9 3.6 3.5 4.3  CL 105 104 104 110  CO2 26 25 23 24   GLUCOSE 154* 116* 103* 138*  BUN 48* 44* 37* 33*  CREATININE 3.09* 2.38* 2.06* 1.68*  CALCIUM 8.8* 8.4* 8.1* 8.4*  MG  --  1.9  --  2.0  PHOS  --  2.4*  --   --    Liver Function Tests: Recent Labs  Lab 07/03/19 1350 07/04/19 0757  AST 13* 11*  ALT 11 11  ALKPHOS 56 52  BILITOT 1.7* 1.3*  PROT 6.1* 5.4*  ALBUMIN 3.3* 2.7*   No results for input(s): LIPASE, AMYLASE in the last 168 hours. No results for input(s): AMMONIA in the last 168 hours. CBC: Recent Labs  Lab 07/03/19 1350 07/04/19 0757 07/05/19 0540 07/06/19 0427  WBC 12.0* 9.8 7.0 4.7  NEUTROABS 10.1*  --   --   --   HGB 12.5* 11.3* 12.2* 11.8*  HCT 37.5* 34.1* 37.9* 36.4*  MCV 89.9 88.8 91.1 89.9  PLT 120* 120* 131* 149*   Cardiac Enzymes: No results for input(s): CKTOTAL, CKMB, CKMBINDEX, TROPONINI in  the last 168 hours. BNP: Invalid input(s): POCBNP CBG: No results for input(s): GLUCAP in the last 168 hours. D-Dimer No results for input(s): DDIMER in the last 72 hours. Hgb A1c No results for input(s): HGBA1C in the last 72 hours. Lipid Profile No results for input(s): CHOL, HDL, LDLCALC, TRIG, CHOLHDL, LDLDIRECT in the last 72 hours. Thyroid function studies Recent Labs    07/04/19 0757  TSH 1.545   Anemia work up No results for input(s): VITAMINB12, FOLATE, FERRITIN, TIBC, IRON, RETICCTPCT in the last 72 hours. Urinalysis    Component Value Date/Time   COLORURINE YELLOW 07/03/2019 1618   APPEARANCEUR HAZY (A) 07/03/2019 1618   LABSPEC 1.023 07/03/2019 1618   PHURINE 5.0 07/03/2019 1618   GLUCOSEU NEGATIVE 07/03/2019 1618   GLUCOSEU NEGATIVE 08/04/2018 0949   HGBUR SMALL (A) 07/03/2019 1618   BILIRUBINUR NEGATIVE 07/03/2019 1618   KETONESUR NEGATIVE 07/03/2019 1618   PROTEINUR 30 (A) 07/03/2019 1618   UROBILINOGEN 1.0 08/04/2018 0949   NITRITE NEGATIVE 07/03/2019 1618   LEUKOCYTESUR SMALL (A) 07/03/2019 1618   Sepsis Labs Invalid input(s): PROCALCITONIN,  WBC,  LACTICIDVEN Microbiology Recent Results (from the past 240 hour(s))  Blood Culture (routine x 2)     Status: None (Preliminary result)   Collection Time: 07/03/19  1:30 PM   Specimen: BLOOD  Result Value Ref Range Status   Specimen Description   Final    BLOOD RIGHT ANTECUBITAL Performed at [facility], [address] [place], [place] [address]    Special Requests   Final    BOTTLES DRAWN AEROBIC AND ANAEROBIC Blood Culture adequate volume Performed at [facility], [address], [place], [place] [address]    Culture  Setup Time   Final    GRAM NEGATIVE RODS AEROBIC BOTTLE ONLY CRITICAL VALUE NOTED.  VALUE IS CONSISTENT WITH PREVIOUSLY REPORTED AND CALLED VALUE. Performed at [facility], [address], [place], [place] [address]    Culture GRAM  NEGATIVE RODS  Final   Report Status PENDING  Incomplete  Blood Culture (routine x 2)     Status: Abnormal   Collection Time: 07/03/19  2:22 PM   Specimen: BLOOD LEFT HAND  Result Value Ref Range Status   Specimen Description   Final    BLOOD LEFT HAND Performed at [facility], [address] [place], [place] [address]    Special Requests   Final    BOTTLES DRAWN AEROBIC AND ANAEROBIC Blood Culture results may not be optimal due to an inadequate volume of blood received in culture bottles Performed at [facility], [address] [place], [place] [address]    Culture  Setup Time   Final    GRAM NEGATIVE RODS IN BOTH AEROBIC AND ANAEROBIC BOTTLES CRITICAL RESULT CALLED TO, READ BACK BY AND VERIFIED WITH: L. POINDEXTER, PHARMD (WL) AT 1919 ON 07/04/19 BY C. JESSUP, MT. Performed at [facility], [address] [place], [place] [address]    Culture KLEBSIELLA PNEUMONIAE (A)  Final   Report Status 07/06/2019 FINAL  Final   Organism ID, Bacteria KLEBSIELLA PNEUMONIAE  Final      Susceptibility   Klebsiella pneumoniae - MIC*    AMPICILLIN >=32 RESISTANT Resistant     CEFAZOLIN <=4 SENSITIVE Sensitive     CEFEPIME <=0.12 SENSITIVE Sensitive     CEFTAZIDIME <=1 SENSITIVE Sensitive     CEFTRIAXONE <=0.25 SENSITIVE Sensitive     CIPROFLOXACIN <=0.25 SENSITIVE Sensitive     GENTAMICIN <=1 SENSITIVE Sensitive     IMIPENEM <=0.25 SENSITIVE Sensitive     TRIMETH/SULFA <=20 SENSITIVE Sensitive     AMPICILLIN/SULBACTAM 4 SENSITIVE Sensitive     PIP/TAZO <=4 SENSITIVE Sensitive     * KLEBSIELLA PNEUMONIAE  Blood Culture ID Panel (Reflexed)     Status: Abnormal   Collection Time: 07/03/19  2:22 PM  Result Value Ref Range Status   Enterococcus species NOT DETECTED NOT DETECTED Final   Listeria monocytogenes NOT DETECTED NOT DETECTED Final   Staphylococcus species NOT DETECTED NOT DETECTED Final   Staphylococcus aureus (BCID) NOT DETECTED NOT  DETECTED Final   Streptococcus species NOT DETECTED NOT DETECTED Final   Streptococcus agalactiae NOT DETECTED NOT DETECTED Final   Streptococcus pneumoniae NOT DETECTED NOT DETECTED Final   Streptococcus pyogenes NOT DETECTED NOT DETECTED Final   Acinetobacter baumannii NOT DETECTED NOT DETECTED Final   Enterobacteriaceae species DETECTED (A) NOT DETECTED Final    Comment: Enterobacteriaceae represent a large family of gram-negative bacteria, not a single organism. CRITICAL RESULT CALLED TO, READ BACK BY AND VERIFIED WITH: L. POINDEXTER, PHARMD (WL) AT 1919 ON 07/04/19 BY C. JESSUP, MT.    Enterobacter cloacae complex NOT DETECTED NOT DETECTED Final   Escherichia coli NOT DETECTED NOT DETECTED Final   Klebsiella oxytoca NOT DETECTED NOT DETECTED Final   Klebsiella pneumoniae DETECTED (A) NOT DETECTED Final    Comment: CRITICAL RESULT CALLED TO, READ BACK BY AND VERIFIED WITH: L. POINDEXTER, PHARMD (WL) AT 1919 ON 07/04/19 BY C. JESSUP, MT.    Proteus species NOT DETECTED NOT DETECTED Final   Serratia marcescens NOT DETECTED NOT DETECTED Final   Carbapenem resistance NOT DETECTED NOT DETECTED Final   Haemophilus influenzae NOT DETECTED NOT DETECTED Final   Neisseria meningitidis NOT DETECTED NOT DETECTED Final   Pseudomonas aeruginosa NOT DETECTED NOT DETECTED Final   Candida albicans NOT DETECTED NOT DETECTED Final   Candida glabrata NOT DETECTED NOT DETECTED Final   Candida krusei NOT DETECTED NOT DETECTED Final   Candida parapsilosis NOT DETECTED NOT DETECTED Final   Candida tropicalis NOT DETECTED NOT DETECTED Final    Comment: Performed at [facility], [address] [place], [place] [address]  Respiratory Panel by RT PCR (Flu A&B, Covid) - Nasopharyngeal Swab     Status: None   Collection Time: 07/03/19  3:23 PM   Specimen: Nasopharyngeal Swab  Result Value Ref Range Status   SARS Coronavirus 2 by RT PCR NEGATIVE NEGATIVE Final    Comment: (NOTE) SARS-CoV-2 target  nucleic acids are NOT DETECTED. The SARS-CoV-2 RNA is generally detectable in upper respiratoy  specimens during the acute phase of infection. The lowest concentration of SARS-CoV-2 viral copies this assay can detect is 131 copies/mL. A negative result does not preclude SARS-Cov-2 infection and should not be used as the sole basis for treatment or other patient management decisions. A negative result may occur with  improper specimen collection/handling, submission of specimen other than nasopharyngeal swab, presence of viral mutation(s) within the areas targeted by this assay, and inadequate number of viral copies (<131 copies/mL). A negative result must be combined with clinical observations, patient history, and epidemiological information. The expected result is Negative. Fact Sheet for Patients:  https://www.moore.com/ Fact Sheet for Healthcare Providers:  https://www.young.biz/ This test is not yet ap proved or cleared by the [place] FDA and  has been authorized for detection and/or diagnosis of SARS-CoV-2 by FDA under an Emergency Use Authorization (EUA). This EUA will remain  in effect (meaning this test can be used) for the duration of the COVID-19 declaration under Section 564(b)(1) of the Act, 21 U.S.C. section 360bbb-3(b)(1), unless the authorization is terminated or revoked sooner.    Influenza A by PCR NEGATIVE NEGATIVE Final   Influenza B by PCR NEGATIVE NEGATIVE Final    Comment: (NOTE) The Xpert Xpress SARS-CoV-2/FLU/RSV assay is intended as an aid in  the diagnosis of influenza from Nasopharyngeal swab specimens and  should not be used as a sole basis for treatment. Nasal washings and  aspirates are unacceptable for Xpert Xpress SARS-CoV-2/FLU/RSV  testing. Fact Sheet for Patients: https://www.moore.com/ Fact Sheet for Healthcare Providers: https://www.young.biz/ This test is not  yet approved or cleared by the [place] FDA and  has been authorized for detection and/or diagnosis of SARS-CoV-2 by  FDA under an Emergency Use Authorization (EUA). This EUA will remain  in effect (meaning this test can be used) for the duration of the  Covid-19 declaration under Section 564(b)(1) of the Act, 21  U.S.C. section 360bbb-3(b)(1), unless the authorization is  terminated or revoked. Performed at [facility], [address], [place], [place] [address]   Urine culture     Status: Abnormal   Collection Time: 07/03/19  4:18 PM   Specimen: In/Out Cath Urine  Result Value Ref Range Status   Specimen Description   Final    IN/OUT CATH URINE Performed at [facility], [address], [place], [place] [address]    Special Requests   Final    NONE Performed at [facility], [address], [place], [place] [address]    Culture >=100,000 COLONIES/mL KLEBSIELLA PNEUMONIAE (A)  Final   Report Status 07/05/2019 FINAL  Final   Organism ID, Bacteria KLEBSIELLA PNEUMONIAE (A)  Final      Susceptibility   Klebsiella pneumoniae - MIC*    AMPICILLIN >=32 RESISTANT Resistant     CEFAZOLIN <=4 SENSITIVE Sensitive     CEFTRIAXONE <=0.25 SENSITIVE Sensitive     CIPROFLOXACIN <=0.25 SENSITIVE Sensitive     GENTAMICIN <=1 SENSITIVE Sensitive     IMIPENEM <=0.25 SENSITIVE Sensitive     NITROFURANTOIN 64 INTERMEDIATE Intermediate     TRIMETH/SULFA <=20 SENSITIVE Sensitive     AMPICILLIN/SULBACTAM 4 SENSITIVE Sensitive     PIP/TAZO <=4 SENSITIVE Sensitive     * >=100,000 COLONIES/mL KLEBSIELLA PNEUMONIAE  MRSA PCR Screening     Status: None   Collection Time: 07/04/19  5:06 AM   Specimen: Nasal Mucosa; Nasopharyngeal  Result Value Ref Range Status   MRSA by PCR NEGATIVE NEGATIVE Final  Comment:        The GeneXpert MRSA Assay (FDA approved for NASAL specimens only), is one component of a comprehensive MRSA  colonization surveillance program. It is not intended to diagnose MRSA infection nor to guide or monitor treatment for MRSA infections. Performed at [facility], [address] [place], [place] [address]   Surgical pcr screen     Status: None   Collection Time: 07/05/19 11:43 AM   Specimen: Nasal Mucosa; Nasal Swab  Result Value Ref Range Status   MRSA, PCR NEGATIVE NEGATIVE Final   Staphylococcus aureus NEGATIVE NEGATIVE Final    Comment: (NOTE) The Xpert SA Assay (FDA approved for NASAL specimens in patients 42 years of age and older), is one component of a comprehensive surveillance program. It is not intended to diagnose infection nor to guide or monitor treatment. Performed at [facility], [address] [place], [place] [address]      Time coordinating discharge: Over 30 minutes  SIGNED:   Alvira Philips [place], DO  [facility] 07/06/2019, 10:06 AM

## 2019-07-06 NOTE — Progress Notes (Signed)
Discussed with patient wife discharge instructions, she verbalized agreement and understanding.  Patient to leave by PTAR with all belongings.

## 2019-07-06 NOTE — TOC Transition Note (Signed)
Transition of Care [facility]) - CM/SW Discharge Note   Patient Details  Name: [name] MRN: [id number] Date of Birth: 12/11/1940  [facility] CM/SW Contact:  Lanier Clam, RN Phone Number: 07/06/2019, 1:12 PM   Clinical Narrative:   Dc home w/[facility], PTAR called. No further CM needs.          Patient Goals and CMS Choice        Discharge Placement                       Discharge Plan and Services                                     Social Determinants of Health (SDOH) Interventions     Readmission Risk Interventions No flowsheet data found.

## 2019-07-07 ENCOUNTER — Ambulatory Visit: Payer: Medicare Other | Admitting: Internal Medicine

## 2019-07-07 ENCOUNTER — Telehealth: Payer: Self-pay | Admitting: *Deleted

## 2019-07-07 LAB — CULTURE, BLOOD (ROUTINE X 2): Special Requests: ADEQUATE

## 2019-07-07 NOTE — Telephone Encounter (Signed)
Transition Care Management Follow-up Telephone Call   Date discharged? 07/22/19   How have you been since you were released from the hospital? Spoke w/wife she states today he said he is feeling alright. She states he had a hard time last night. He was confuse, and couldn't explain how he felt.    Do you understand why you were in the hospital? YES   Do you understand the discharge instructions? YES   Where were you discharged to? HOME   Items Reviewed:  Medications reviewed: YES, he is no longer taking amlodipine, lasix and potassium. Which has been updated on med list  Allergies reviewed: YES  Dietary changes reviewed: YES  Referrals reviewed: YES, she states [facility] has reached out to them, and will be coming out this afternoon. Still waiting on appt w/Dr. Alvester Morin   Functional Questionnaire:   Activities of Daily Living (ADLs):   She state he are independent in the following: bathing and hygiene, feeding, continence, grooming, toileting and dressing States they require assistance with the following: ambulation has a cane to help w/balance    Any transportation issues/concerns?: NO   Any patient concerns? YES  Confirmed importance and date/time of follow-up visits scheduled YES, appt 07/22/19  Provider Appointment booked with Dr. Posey Rea  Confirmed with patient if condition begins to worsen call PCP or go to the ER.  Patient was given the office number and encouraged to call back with question or concerns.  : YES

## 2019-07-22 ENCOUNTER — Encounter: Payer: Self-pay | Admitting: Internal Medicine

## 2019-07-22 ENCOUNTER — Other Ambulatory Visit: Payer: Self-pay

## 2019-07-22 ENCOUNTER — Ambulatory Visit (INDEPENDENT_AMBULATORY_CARE_PROVIDER_SITE_OTHER): Payer: Medicare Other | Admitting: Internal Medicine

## 2019-07-22 DIAGNOSIS — D649 Anemia, unspecified: Secondary | ICD-10-CM

## 2019-07-22 DIAGNOSIS — E538 Deficiency of other specified B group vitamins: Secondary | ICD-10-CM

## 2019-07-22 DIAGNOSIS — R5383 Other fatigue: Secondary | ICD-10-CM | POA: Diagnosis not present

## 2019-07-22 DIAGNOSIS — G20 Parkinson's disease: Secondary | ICD-10-CM

## 2019-07-22 DIAGNOSIS — I10 Essential (primary) hypertension: Secondary | ICD-10-CM | POA: Diagnosis not present

## 2019-07-22 DIAGNOSIS — A419 Sepsis, unspecified organism: Secondary | ICD-10-CM | POA: Diagnosis not present

## 2019-07-22 DIAGNOSIS — N183 Chronic kidney disease, stage 3 unspecified: Secondary | ICD-10-CM | POA: Diagnosis not present

## 2019-07-22 DIAGNOSIS — F0280 Dementia in other diseases classified elsewhere without behavioral disturbance: Secondary | ICD-10-CM | POA: Diagnosis not present

## 2019-07-22 DIAGNOSIS — N179 Acute kidney failure, unspecified: Secondary | ICD-10-CM

## 2019-07-22 DIAGNOSIS — R6520 Severe sepsis without septic shock: Secondary | ICD-10-CM

## 2019-07-22 DIAGNOSIS — R600 Localized edema: Secondary | ICD-10-CM

## 2019-07-22 LAB — CBC WITH DIFFERENTIAL/PLATELET
Basophils Absolute: 0.1 10*3/uL (ref 0.0–0.1)
Basophils Relative: 1.5 % (ref 0.0–3.0)
Eosinophils Absolute: 0 10*3/uL (ref 0.0–0.7)
Eosinophils Relative: 0.8 % (ref 0.0–5.0)
HCT: 34.6 % — ABNORMAL LOW (ref 39.0–52.0)
Hemoglobin: 11.8 g/dL — ABNORMAL LOW (ref 13.0–17.0)
Lymphocytes Relative: 38.5 % (ref 12.0–46.0)
Lymphs Abs: 1.7 10*3/uL (ref 0.7–4.0)
MCHC: 34.2 g/dL (ref 30.0–36.0)
MCV: 86 fl (ref 78.0–100.0)
Monocytes Absolute: 0.3 10*3/uL (ref 0.1–1.0)
Monocytes Relative: 7.9 % (ref 3.0–12.0)
Neutro Abs: 2.2 10*3/uL (ref 1.4–7.7)
Neutrophils Relative %: 51.3 % (ref 43.0–77.0)
Platelets: 257 10*3/uL (ref 150.0–400.0)
RBC: 4.02 Mil/uL — ABNORMAL LOW (ref 4.22–5.81)
RDW: 13.6 % (ref 11.5–15.5)
WBC: 4.4 10*3/uL (ref 4.0–10.5)

## 2019-07-22 LAB — HEPATIC FUNCTION PANEL
ALT: 2 U/L (ref 0–53)
AST: 11 U/L (ref 0–37)
Albumin: 3.3 g/dL — ABNORMAL LOW (ref 3.5–5.2)
Alkaline Phosphatase: 73 U/L (ref 39–117)
Bilirubin, Direct: 0.1 mg/dL (ref 0.0–0.3)
Total Bilirubin: 0.8 mg/dL (ref 0.2–1.2)
Total Protein: 6 g/dL (ref 6.0–8.3)

## 2019-07-22 LAB — BASIC METABOLIC PANEL
BUN: 15 mg/dL (ref 6–23)
CO2: 32 mEq/L (ref 19–32)
Calcium: 8.7 mg/dL (ref 8.4–10.5)
Chloride: 102 mEq/L (ref 96–112)
Creatinine, Ser: 1.51 mg/dL — ABNORMAL HIGH (ref 0.40–1.50)
GFR: 54.12 mL/min — ABNORMAL LOW (ref >60.00)
Glucose, Bld: 100 mg/dL — ABNORMAL HIGH (ref 70–99)
Potassium: 4.2 mEq/L (ref 3.5–5.1)
Sodium: 139 mEq/L (ref 135–145)

## 2019-07-22 MED ORDER — TORSEMIDE 100 MG PO TABS: 50.0000 mg | ORAL_TABLET | Freq: Every day | ORAL | 3 refills | Status: DC | PRN

## 2019-07-22 NOTE — Assessment & Plan Note (Signed)
Worse Stop FUROSEMIDE. Start TORSEMIDE for swelling. Labs NAS diet Elevate legs

## 2019-07-22 NOTE — Assessment & Plan Note (Addendum)
Hydrate well Monitor labs Stop FUROSEMIDE. Start TORSEMIDE for swelling.

## 2019-07-22 NOTE — Patient Instructions (Signed)
Stop FUROSEMIDE. Start TORSEMIDE for swelling.

## 2019-07-22 NOTE — Assessment & Plan Note (Signed)
On B12 

## 2019-07-22 NOTE — Progress Notes (Signed)
Subjective:  Patient ID: [name], male    DOB: 01-26-1941  Age: 79 y.o. MRN: [id number]  CC: No chief complaint on file.   HPI [name] presents for a post-hosp f/u for URI, ARF, sepsis  Doing better   Per hx: " Admit date: 07/03/2019 Discharge date: 07/06/2019  Admitted From: Home Disposition: Home  Recommendations for Outpatient Follow-up:  1. Follow up with PCP in 1-2 weeks 2. Follow-up with urology, Dr. Alvester Morin in 1 week for stent removal 3. Continue ciprofloxacin 500 mg p.o. twice daily to complete a 14-day course for complicated UTI, Klebsiella pneumonia septicemia 4. Started on Cardura by urology 5. Discontinued home amlodipine, ACE inhibitor, furosemide secondary to borderline blood pressures and acute renal failure, recommend follow-up blood pressure and creatinine level prior to restart 6. Please obtain BMP to ensure renal function has improved back to his normal baseline   Home Health: Yes PT/OT Equipment/Devices: 3 in 1 bedside commode  Discharge Condition: Stable CODE STATUS: DNR  Diet recommendation: Heart healthy, low-salt diet  History of present illness:  Larry Geister Parkeris an 79 year old Caucasian male with past medical history remarkable for dementia, Parkinson's disease, HTN, HLD, frequent falls and history of previous UTIs who presented to the ED with confusion and lethargy. Reports that he has had this strong smelling urine which is atypical symptom/sign for recurrent UTI. He is also been having a difficult time ambulating with progressive weakness over the past 4 days, now requiring assistance with ambulation. Upon EMS arrival, he was noted to be febrile up to 101.0 and was given Tylenol.  Per family patient have not had any cough no congestion or shortness of breath no nausea no vomiting no diarrhea has not been complaining about back pain abdominal pain his blood pressure has been running a bit low so his wife did not give him any blood  pressure medications lately. Denies any known Covid contacts.  In the ED, noted temperature 102.0. HR 114, RR 20, BP 100/54, with SPO2 94% on room air. Sodium 139, potassium 3.9, chloride 105, CO2 26, glucose 154, BUN 48, creatinine 3.09. WBC count 12.0, hemoglobin 12.5, platelets 120. Influenza negative. Covid-19/SARS-CoV-2 negative. Urinalysis with small leukoesterase, negative nitrite, many bacteria, 21-50 WBCs. Patient referred by ED physician for admission given sepsis and further evaluation and treatment.  Hospital course:  Sepsis, present on admission Klebsiella pneumonia septicemia/UTI Left hydronephrosis with obstructing stone Patient presenting from home with weakness, lethargy over the past 4 days with foul-smelling urine. History of recurrent UTIs. On presentation he was noted to be septic with elevated heart rate, elevated respiratory rate, fever and endorgan damage with renal failure. Lactic acid 0.9 with procalcitonin elevated 4.18. Ultrasound renal with mild left hydronephrosis. CT abdomen/pelvis with left-sided obstructive uropathy with 2 mm stone in the left UV junction with mild hydroutero nephrosis. Blood culture 1/2+ GNR's with BCID remarkable for Klebsiella pneumonia. Repeat renal ultrasound shows persistent mild left hydronephrosis.  Patient was started on IV ceftriaxone with resolution of leukocytosis.  Seen by urology underwent cystoscopy with stone extraction and stent placement on 07/06/2019.  Will discharge home with ciprofloxacin 500 mg p.o. twice daily to complete a 14-day course.  Will need follow-up with urology 1 week for stent removal.  Continue doxazosin 4 mg p.o. daily.  Acute renal failure Creatinine 3.09 on admission. Etiology likely secondary to prerenal azotemia from severe dehydration versus ATN from underlying septicemia/UTI. Urine sodium 45, urine creatinine 222, FeNa0.5% consistent with prerenal.  Patient was  started on IV fluid hydration.   Creatinine improved from 3.09 to 1.68 at time of discharge.  We will continue to hold home ACE inhibitor and loop diuretic.  Recommend repeat BMP in 1 week to ensure creatinine back to baseline; prior to restarting home ACE inhibitor and diuretic.  Parkinson's dementia Continue Sinemet 25-200 mg p.o. 3 times daily, Trazodone 100 mg p.o. nightly  Essential hypertension BP 116/61, this morning, well controlled. Continue to hold home amlodipine-benazepril 5-20 mg p.o. daily, also holding home furosemide 40 mg p.o. daily that he uses prn until follows up with PCP for repeat blood pressure check and BMP to evaluate creatinine level.  Frequent falls: Evaluated by PT/OT while inpatient.  Recommend home health on discharge.  Discharge Diagnoses:  Principal Problem:   Hydronephrosis with renal and ureteral calculus obstruction Active Problems:   Essential hypertension   Dyslipidemia   Falls frequently   ARF (acute renal failure) (HCC)   Sepsis ([place])   Dementia due to Parkinson's disease without behavioral disturbance (HCC)   CKD (chronic kidney disease), stage III   Constipation   Acute metabolic encephalopathy"    Outpatient Medications Prior to Visit  Medication Sig Dispense Refill  . B Complex-C (B-COMPLEX WITH VITAMIN C) tablet Take 1 tablet by mouth daily.    . carbidopa-levodopa (SINEMET IR) 25-100 MG tablet Take 1 tablet three times a day with meals (Patient taking differently: Take 1 tablet by mouth 3 (three) times daily. with meals) 90 tablet 11  . Cholecalciferol (VITAMIN D) 1000 UNITS capsule Take 1 capsule (1,000 Units total) by mouth daily. 100 capsule 3  . cyanocobalamin (,VITAMIN B-12,) 1000 MCG/ML injection 1 ml sq for 5 days, then 1 ml weekly for 4 weeks, then 1 ml every 2 weeks (Patient taking differently: Inject 1,000 mcg into the muscle See admin instructions. 1 ml sq for 5 days, then 1 ml weekly for 4 weeks, then 1 ml every 2 weeks) 10 mL 6  . doxazosin (CARDURA) 4  MG tablet Take 1 tablet (4 mg total) by mouth daily. 30 tablet 0  . furosemide (LASIX) 40 MG tablet Take 40 mg by mouth daily.    [name] loratadine-pseudoephedrine (CLARITIN-D 24-HOUR) 10-240 MG 24 hr tablet Take 1 tablet by mouth daily as needed for allergies.    . SYRINGE-NEEDLE, DISP, 3 ML (BD ECLIPSE SYRINGE) 25G X 1" 3 ML MISC Use sq as directed 50 each 3  . traZODone (DESYREL) 50 MG tablet TAKE 2 TABLETS BY MOUTH EVERY DAY AT BEDTIME (Patient taking differently: Take 100 mg by mouth at bedtime. ) 180 tablet 2   No facility-administered medications prior to visit.    ROS: Review of Systems  Constitutional: Positive for fatigue. Negative for appetite change and unexpected weight change.  HENT: Negative for congestion, nosebleeds, sneezing, sore throat and trouble swallowing.   Eyes: Negative for itching and visual disturbance.  Respiratory: Negative for cough.   Cardiovascular: Negative for chest pain, palpitations and leg swelling.  Gastrointestinal: Negative for abdominal distention, blood in stool, diarrhea and nausea.  Genitourinary: Negative for frequency and hematuria.  Musculoskeletal: Positive for gait problem. Negative for back pain, joint swelling and neck pain.  Skin: Negative for rash.  Neurological: Positive for dizziness, weakness and light-headedness. Negative for tremors and speech difficulty.  Psychiatric/Behavioral: Positive for behavioral problems, confusion and decreased concentration. Negative for agitation, dysphoric mood, sleep disturbance and suicidal ideas. The patient is not nervous/anxious.     Objective:  BP (!) 160/84 (BP Location:  Right Arm, Patient Position: Sitting, Cuff Size: Large)   Pulse (!) 50   Temp 97.9 F (36.6 C) (Oral)   Ht 5\' 8"  (1.727 m)   Wt 189 lb (85.7 kg)   SpO2 99%   BMI 28.74 kg/m   BP Readings from Last 3 Encounters:  07/22/19 (!) 160/84  07/06/19 132/67  04/28/19 124/68    Wt Readings from Last 3 Encounters:  07/22/19 189 lb  (85.7 kg)  07/05/19 180 lb (81.6 kg)  04/28/19 180 lb (81.6 kg)    Physical Exam Constitutional:      General: He is not in acute distress.    Appearance: Normal appearance. He is well-developed.     Comments: NAD  Eyes:     Conjunctiva/sclera: Conjunctivae normal.     Pupils: Pupils are equal, round, and reactive to light.  Neck:     Thyroid: No thyromegaly.     Vascular: No JVD.  Cardiovascular:     Rate and Rhythm: Normal rate and regular rhythm.     Heart sounds: Normal heart sounds. No murmur. No friction rub. No gallop.   Pulmonary:     Effort: Pulmonary effort is normal. No respiratory distress.     Breath sounds: Normal breath sounds. No wheezing or rales.  Chest:     Chest wall: No tenderness.  Abdominal:     General: Bowel sounds are normal. There is no distension.     Palpations: Abdomen is soft. There is no mass.     Tenderness: There is no abdominal tenderness. There is no guarding or rebound.  Musculoskeletal:        General: No tenderness. Normal range of motion.     Cervical back: Normal range of motion.     Right lower leg: Edema present.     Left lower leg: Edema present.  Lymphadenopathy:     Cervical: No cervical adenopathy.  Skin:    General: Skin is warm and dry.     Findings: No rash.  Neurological:     Mental Status: He is alert. He is disoriented.     Cranial Nerves: No cranial nerve deficit.     Motor: No abnormal muscle tone.     Coordination: Coordination abnormal.     Gait: Gait normal.     Deep Tendon Reflexes: Reflexes are normal and symmetric.  Psychiatric:        Behavior: Behavior normal.        Thought Content: Thought content normal.        Judgment: Judgment normal.   2+edema B A complex case. Hosp stay reviewed >60 min  Lab Results  Component Value Date   WBC 4.7 07/06/2019   HGB 11.8 (L) 07/06/2019   HCT 36.4 (L) 07/06/2019   PLT 149 (L) 07/06/2019   GLUCOSE 138 (H) 07/06/2019   CHOL 183 08/04/2018   TRIG 143.0  08/04/2018   HDL 30.40 (L) 08/04/2018   LDLDIRECT 114.0 12/30/2016   LDLCALC 124 (H) 08/04/2018   ALT 11 07/04/2019   AST 11 (L) 07/04/2019   NA 141 07/06/2019   K 4.3 07/06/2019   CL 110 07/06/2019   CREATININE 1.68 (H) 07/06/2019   BUN 33 (H) 07/06/2019   CO2 24 07/06/2019   TSH 1.545 07/04/2019   PSA 0.31 08/04/2018   INR 1.4 (H) 07/03/2019   HGBA1C 5.5 02/13/2019   MICROALBUR 2.9 (H) 12/30/2016    CT ABDOMEN PELVIS WO CONTRAST  Result Date: 07/04/2019 CLINICAL DATA:  Constipation or  obstruction. EXAM: CT ABDOMEN AND PELVIS WITHOUT CONTRAST TECHNIQUE: Multidetector CT imaging of the abdomen and pelvis was performed following the standard protocol without IV contrast. COMPARISON:  None. FINDINGS: LOWER CHEST: Bibasilar atelectasis. HEPATOBILIARY: Normal hepatic contours. No intra- or extrahepatic biliary dilatation. Normal gallbladder. PANCREAS: Normal pancreas. No ductal dilatation or peripancreatic fluid collection. SPLEEN: Normal. ADRENALS/URINARY TRACT: The adrenal glands are normal. There is a stone within the distal left ureter, at the ureterovesical junction, measuring 2 mm, causing mild hydroureteronephrosis and mild perinephric stranding. The right kidney is normal. The urinary bladder is normal for degree of distention STOMACH/BOWEL: There is no hiatal hernia. Normal duodenal course and caliber. No small bowel dilatation or inflammation. No focal colonic abnormality. Normal appendix. VASCULAR/LYMPHATIC: There is calcific atherosclerosis of the abdominal aorta. No abdominal or pelvic lymphadenopathy. REPRODUCTIVE: Normal prostate size with symmetric seminal vesicles. MUSCULOSKELETAL. Multilevel degenerative disc disease and facet arthrosis. No bony spinal canal stenosis. OTHER: None. IMPRESSION: 1. Left-sided obstructive uropathy with 2 mm stone at the left ureterovesical junction causing mild hydroureteronephrosis and perinephric stranding. 2.  Aortic atherosclerosis (ICD10-I70.0).  Electronically Signed   By: Deatra Robinson M.D.   On: 07/04/2019 01:56   DG Abd 1 View  Result Date: 07/03/2019 CLINICAL DATA:  Constipation EXAM: ABDOMEN - 1 VIEW COMPARISON:  None. FINDINGS: Mild diffuse gaseous distention of bowel, likely ileus. No significant increase in stool burden. No organomegaly or free air. No acute bony abnormality. IMPRESSION: Diffuse gaseous distention of bowel, likely ileus. Electronically Signed   By: Charlett Nose M.D.   On: 07/03/2019 20:17   US Renal  Result Date: 07/03/2019 CLINICAL DATA:  79 year old male with acute renal insufficiency. EXAM: RENAL / URINARY TRACT ULTRASOUND COMPLETE COMPARISON:  Renal ultrasound dated 02/13/2019. FINDINGS: Right Kidney: Renal measurements: 12.6 x 5.1 x 5.1 cm = volume: 172 mL. Mild increased echogenicity. No hydronephrosis or shadowing stone. Left Kidney: Renal measurements: 12.1 x 6.5 x 7.0 cm = volume: 287 mL. Mildly echogenic. There is mild left hydronephrosis. No shadowing stone. Bladder: The urinary bladder is partially distended and grossly unremarkable. Other: None. IMPRESSION: 1. Mild increased renal echogenicity may be related to chronic kidney disease. 2. Mild left hydronephrosis.  No shadowing stone. Electronically Signed   By: Elgie Collard M.D.   On: 07/03/2019 20:49   DG Chest Port 1 View  Result Date: 07/03/2019 CLINICAL DATA:  Weakness.  Hypertension. EXAM: PORTABLE CHEST 1 VIEW COMPARISON:  February 13, 2019 FINDINGS: There is left base scarring. Lungs elsewhere are clear. Heart is upper normal in size with pulmonary vascularity normal. No adenopathy. No bone lesions. IMPRESSION: Scarring left base, stable. Lungs elsewhere clear. Heart upper normal in size. No adenopathy. Electronically Signed   By: Bretta Bang III M.D.   On: 07/03/2019 13:52    Assessment & Plan:   There are no diagnoses linked to this encounter.   No orders of the defined types were placed in this encounter.    Follow-up: No  follow-ups on file.  Sonda Primes, MD

## 2019-07-22 NOTE — Assessment & Plan Note (Signed)
NAS diet Labs

## 2019-07-22 NOTE — Assessment & Plan Note (Signed)
Afebrile 

## 2019-07-23 LAB — IRON,TIBC AND FERRITIN PANEL
%SAT: 28 % (calc) (ref 20–48)
Ferritin: 198 ng/mL (ref 24–380)
Iron: 64 ug/dL (ref 50–180)
TIBC: 227 mcg/dL (calc) — ABNORMAL LOW (ref 250–425)

## 2019-07-29 ENCOUNTER — Ambulatory Visit: Payer: Medicare Other | Admitting: Internal Medicine

## 2019-08-10 ENCOUNTER — Telehealth: Payer: Self-pay | Admitting: Internal Medicine

## 2019-08-10 NOTE — Telephone Encounter (Signed)
   Spouse calling, states patient needs clarification on medications Does patient need to continue taking doxazosin (CARDURA) 4 MG tablet ? Patient under the impression he needs to be taking Amlodipine   Please call

## 2019-08-13 ENCOUNTER — Telehealth: Payer: Self-pay | Admitting: Neurology

## 2019-08-13 NOTE — Telephone Encounter (Signed)
LMTCB  I do not see amlodipine in chart anymore only doxazosin

## 2019-08-13 NOTE — Telephone Encounter (Signed)
[name] called regarding this patient and needing a verbal order to add a medical social worker to help provide resources for the spouse to take care of her husband. Please Call. Thank you

## 2019-08-13 NOTE — Telephone Encounter (Signed)
Left voice mail on secure line for verbal order for medical social worker

## 2019-08-16 ENCOUNTER — Telehealth: Payer: Self-pay | Admitting: Internal Medicine

## 2019-08-16 NOTE — Telephone Encounter (Signed)
Patient's daughter is calling in reference to these medications doxazosin (CARDURA) 4 MG tablet(Expired) and the potassium med. She has some question on when the patient should take this medication. Please advise.

## 2019-08-17 ENCOUNTER — Other Ambulatory Visit: Payer: Self-pay

## 2019-08-17 MED ORDER — DOXAZOSIN MESYLATE 4 MG PO TABS: 4.0000 mg | ORAL_TABLET | Freq: Every day | ORAL | 3 refills | Status: DC

## 2019-08-17 NOTE — Telephone Encounter (Signed)
Went through medication list with pt, wife, and daughter and the stated they understood what pt is supposed to be taking.

## 2019-08-17 NOTE — Telephone Encounter (Signed)
Went through medication list with pt, wife, and daughter and the stated they understood what pt is supposed to be taking. 

## 2019-08-22 ENCOUNTER — Other Ambulatory Visit: Payer: Self-pay | Admitting: Internal Medicine

## 2019-08-26 ENCOUNTER — Ambulatory Visit: Payer: Medicare Other | Attending: Internal Medicine

## 2019-09-02 ENCOUNTER — Other Ambulatory Visit: Payer: Self-pay | Admitting: Neurology

## 2019-09-24 ENCOUNTER — Other Ambulatory Visit: Payer: Self-pay | Admitting: Internal Medicine

## 2019-10-19 ENCOUNTER — Other Ambulatory Visit: Payer: Self-pay

## 2019-10-19 ENCOUNTER — Encounter: Payer: Self-pay | Admitting: Neurology

## 2019-10-19 ENCOUNTER — Ambulatory Visit: Payer: Medicare Other | Admitting: Neurology

## 2019-10-19 VITALS — BP 188/93 | HR 63 | Ht 68.0 in | Wt 181.6 lb

## 2019-10-19 DIAGNOSIS — G20 Parkinson's disease: Secondary | ICD-10-CM | POA: Diagnosis not present

## 2019-10-19 DIAGNOSIS — F0391 Unspecified dementia with behavioral disturbance: Secondary | ICD-10-CM

## 2019-10-19 DIAGNOSIS — F03B18 Unspecified dementia, moderate, with other behavioral disturbance: Secondary | ICD-10-CM

## 2019-10-19 MED ORDER — TRAZODONE HCL 50 MG PO TABS: ORAL_TABLET | ORAL | 2 refills | Status: DC

## 2019-10-19 MED ORDER — CARBIDOPA-LEVODOPA 25-100 MG PO TABS: ORAL_TABLET | ORAL | 11 refills | Status: DC

## 2019-10-19 MED ORDER — RIVASTIGMINE TARTRATE 1.5 MG PO CAPS: 1.5000 mg | ORAL_CAPSULE | Freq: Two times a day (BID) | ORAL | 11 refills | Status: DC

## 2019-10-19 NOTE — Progress Notes (Signed)
NEUROLOGY FOLLOW UP OFFICE NOTE  [name] [id number] 09/09/1940  HISTORY OF PRESENT ILLNESS: I had the pleasure of seeing [name] in follow-up in the neurology clinic on 10/19/2019.  The patient was last seen 7 months ago for memory loss and frequent falls. He is again accompanied by his wife who helps supplement the history today.  Since his last visit, he was admitted to [facility] for confusion and lethargy, treated for sepsis, Klebsiella pneumonia septicemia/UTI, and left hydronephrosis with obstructing stone. He has been back home with his wife who is is primary caregiver. She helps him with dressing and bathing, although he states he does it all by himself. He denies any falls, however his wife recalls a fall last month. She states he refuses to use his walker or cane, and it is hard for her to help him, he is not steady. He has constipation, no incontinence. Sleep is pretty good. His wife reports he is sometimes a little more confused. She continues to report visual hallucinations, seeing different cars or people in the room, asking how he got home, sometimes he does not recognize his wife. He is on Trazodone 50mg  1 tabs qhs and Sinemet 25/100mg  1 tab TID with meals, no side effects. His wife manages medications, finances, meal planning. He does not drive.    History on Initial Assessment 11/05/2018: This is a pleasant 79 year old right-handed man with a history of hypertension, diet-controlled hyperlipidemia, presenting for evaluation of memory loss and frequent falls. He states his balance is off. He also reports that his memory "ain't as good as it used to be." He misplaces things frequently. 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 He would say someone stole something because he could not find them. It was not happening frequently, she would sometimes notice that he would put things where they don't belong. A year ago she started  having driving concerns, she would be in the car with him and would ask where he is going, he would stop in the middle of the street. He stopped driving after instructed by Dr. 80 in March 2020. His wife fixes his pillbox and checks behind him. He continues to manage finances and make financial decisions but she has been checking behind him the past 4-5 months. She states that over the past 4-5 months, confusion has been worsening that sometimes he does not realize he is at home, always talking about going home. At times he does not seem to recognize his wife. Symptoms are worse in the afternoon. He also has difficulty understanding instructions to pick his feet up or to help himself up when he falls. He has difficulty getting in and out of bed, or even sometimes moving in bed, as if he cannot turn or move his body up. He has not been sleeping well, waking up at 1am despite recently increased Trazodone 50mg  qhs. He has been having visual hallucinations, asking who is that in the bed or in the room, worse in the evening. She has been helping him with dressing and bathing the past month.  Her main concern have been the significant falls over the past 4 months. Sometimes he can walk independently, but a lot of times she has to help him up, especially getting in and out of bed, or even from a chair. He has a walker now but forgets to pick up his feet to move. He has had 8 or 12  falls in the past 4 months. If he falls and she tries to help him, he cannot seem to understand instructions to push himself up. He denies any neck or back pain, no leg pain, numbness/tingling. It feels like his legs want to give out on him when he stands. No bowel/bladder dysfunction. He has right shoulder pain. His wife is asking about pain medication when he is so stiff waking up in the morning. He denies any headaches, dizziness, diplopia, dysarthria/dysphagia, anosmia. Sometimes he has some shakiness in his hands like he is nervous.  His wife reports personality changes as well, he used to be a jokester. His father had Parkinson's disease. No history of significant head injuries or alcohol use.   I personally reviewed head CT without contrast done 09/23/2018 which did not show any acute changes, there was mild to moderate diffuse atrophy and chronic microvascular disease.  PAST MEDICAL HISTORY: Past Medical History:  Diagnosis Date  . B12 deficiency 11/19/2018  . Benign prostatic hypertrophy   . Edema   . Hyperlipidemia   . Hypertension   . Insomnia   . Parasomnia 10/14/2011   PSG 12/15/11>>AHI 0.4, SpO2 low 89%, PLMI 16, somnoliloquay, nocturia   . Prediabetes 06/10/2012  . Seasonal allergies   . Sepsis (HCC) 01/2019  . UTI (lower urinary tract infection)     MEDICATIONS: Current Outpatient Medications on File Prior to Visit  Medication Sig Dispense Refill  . B Complex-C (B-COMPLEX WITH VITAMIN C) tablet Take 1 tablet by mouth daily.    . carbidopa-levodopa (SINEMET IR) 25-100 MG tablet Take 1 tablet three times a day with meals (Patient taking differently: Take 1 tablet by mouth 3 (three) times daily. with meals) 90 tablet 11  . Cholecalciferol (VITAMIN D) 1000 UNITS capsule Take 1 capsule (1,000 Units total) by mouth daily. 100 capsule 3  . cyanocobalamin (,VITAMIN B-12,) 1000 MCG/ML injection 1 ml sq for 5 days, then 1 ml weekly for 4 weeks, then 1 ml every 2 weeks (Patient taking differently: Inject 1,000 mcg into the muscle See admin instructions. 1 ml sq for 5 days, then 1 ml weekly for 4 weeks, then 1 ml every 2 weeks) 10 mL 6  . doxazosin (CARDURA) 4 MG tablet Take 1 tablet (4 mg total) by mouth daily. 90 tablet 3  . loratadine-pseudoephedrine (CLARITIN-D 24-HOUR) 10-240 MG 24 hr tablet Take 1 tablet by mouth daily as needed for allergies.    . SYRINGE-NEEDLE, DISP, 3 ML (BD ECLIPSE SYRINGE) 25G X 1" 3 ML MISC Use sq as directed 50 each 3  . traZODone (DESYREL) 50 MG tablet TAKE 2 TABLETS BY MOUTH AT BEDTIME  180 tablet 2  . torsemide (DEMADEX) 100 MG tablet Take 0.5-1 tablets (50-100 mg total) by mouth daily as needed. (Patient not taking: Reported on 10/19/2019) 90 tablet 3   No current facility-administered medications on file prior to visit.    ALLERGIES: Allergies  Allergen Reactions  . Amlodipine     Edema w/10 mg  . Atorvastatin     REACTION: leg cramps  . Penicillins Hives    Has patient had a PCN reaction causing immediate rash, facial/tongue/throat swelling, SOB or lightheadedness with hypotension: Yes Has patient had a PCN reaction causing severe rash involving mucus membranes or skin necrosis: No Has patient had a PCN reaction that required hospitalization No Has patient had a PCN reaction occurring within the last 10 years: No If all of the above answers are "NO", then may proceed with Cephalosporin  use.   . Sulfonamide Derivatives Hives  . Telmisartan     ?fatigue  . Sulfa Antibiotics Other (See Comments) and Palpitations    Sweating, passed out    FAMILY HISTORY: Family History  Problem Relation Age of Onset  . Diabetes Mother   . Tremor Father   . Diabetes Other     SOCIAL HISTORY: Social History   Socioeconomic History  . Marital status: Married    Spouse name: Not on file  . Number of children: Not on file  . Years of education: Not on file  . Highest education level: Not on file  Occupational History  . Occupation: Retired  Tobacco Use  . Smoking status: Former Smoker    Packs/day: 1.00    Years: 7.00    Pack years: 7.00    Types: Cigarettes, Cigars  . Smokeless tobacco: Never Used  . Tobacco comment: cigars day/ 1-2       QIUIT SMOKING IN 2000  Substance and Sexual Activity  . Alcohol use: Not Currently    Comment: OCCASSIONAL WINE -1 glass/month  . Drug use: No  . Sexual activity: Not Currently    Partners: Female  Other Topics Concern  . Not on file  Social History Narrative   Rides Motorcycle   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, does landscaping    Right handed   Lives with wife   Social Determinants of Health   Financial Resource Strain:   . Difficulty of Paying Living Expenses:   Food Insecurity:   . Worried About Programme researcher, broadcasting/film/video in the Last Year:   . Barista in the Last Year:   Transportation Needs:   . Freight forwarder (Medical):   [name] Lack of Transportation (Non-Medical):   Physical Activity:   . Days of Exercise per Week:   . Minutes of Exercise per Session:   Stress:   . Feeling of Stress :   Social Connections:   . Frequency of Communication with Friends and Family:   . Frequency of Social Gatherings with Friends and Family:   . Attends Religious Services:   . Active Member of Clubs or Organizations:   . Attends Banker Meetings:   [name] Marital Status:   Intimate Partner Violence:   . Fear of Current or Ex-Partner:   . Emotionally Abused:   [name] Physically Abused:   . Sexually Abused:     REVIEW OF SYSTEMS: Constitutional: No fevers, chills, or sweats, no generalized fatigue, change in appetite Eyes: No visual changes, double vision, eye pain Ear, nose and throat: No hearing loss, ear pain, nasal congestion, sore throat Cardiovascular: No chest pain, palpitations Respiratory:  No shortness of breath at rest or with exertion, wheezes GastrointestinaI: No nausea, vomiting, diarrhea, abdominal pain, fecal incontinence Genitourinary:  No dysuria, urinary retention or frequency Musculoskeletal:  No neck pain, back pain Integumentary: No rash, pruritus, skin lesions Neurological: as above Psychiatric: No depression, insomnia, anxiety Endocrine: No palpitations, fatigue, diaphoresis, mood swings, change in appetite, change in weight, increased thirst Hematologic/Lymphatic:  No anemia, purpura, petechiae. Allergic/Immunologic: no itchy/runny eyes, nasal congestion, recent allergic reactions, rashes  PHYSICAL EXAM: Vitals:   10/19/19 1526  BP: (!) 188/93  Pulse: 63  SpO2: 98%    General: No acute distress, +hypomimia Head:  Normocephalic/atraumatic Skin/Extremities: No rash, no edema Neurological Exam: alert and oriented to person, place, day/month, says year is 2041. No aphasia or dysarthria. Fund of knowledge is reduced.  Recent and remote  memory are impaired.  Attention and concentration are reduced. Able to name objects, difficulty with repetition. MMSE 21/30. MMSE - Mini Mental State Exam 10/19/2019  Orientation to time 3  Orientation to Place 5  Registration 3  Attention/ Calculation 3  Recall 1  Language- name 2 objects 2  Language- repeat 0  Language- follow 3 step command 2  Language- read & follow direction 1  Write a sentence 1  Copy design 0  Total score 21   Cranial nerves: Pupils equal, round, reactive to light.  Extraocular movements intact with no nystagmus. Visual fields full. No facial asymmetry. Motor: +cogwheeling bilaterally. Muscle strength 5/5 throughout with no pronator drift.  Finger to nose testing intact.  Gait slow and cautious. Positive pull test. There is a mild left hand resting tremor noted. Decreased finger and foot taps.   IMPRESSION: This is a pleasant 79 yo RH man with a history of hypertension, hyperlipidemia, who presented with worsening memory and frequent falls. MMSE today 21/30. He had presented with frequent falls, gait instability, and bradykinesia/rigidity raising concern for a parkinsonian syndrome such as PSP or Lewy body dementia (wife continues to report daily hallucinations). We discussed adding on Rivastigmine 1.5mg  BID to hopefully help with behavioral changes/hallucinations associated with dementia. Continue Sinemet 25/100mg  TID and Trazodone 100mg  qhs. Caregiver support provided today, his wife is needing more help at home, referral to social worker sent to help navigate with resources. He was advised to use his walker at all times. Follow-up in 6 months, they know to call for any changes.   Thank you for  allowing me to participate in his care.  Please do not hesitate to call for any questions or concerns.   Ellouise Newer, M.D.   CC: Dr. Alain Marion

## 2019-10-19 NOTE — Patient Instructions (Signed)
1. Start Rivastigmine 1.5mg  twice a day  2. Continue Carbidopa 25/100mg  three times a day with meals  3. Continue Trazodone 50mg : take 2 tabs at bedtime  4. Referral will be sent for a social worker to provide more resources for help at home  5. Use your walker at all times  6. Follow-up in 6 months, call for any changes

## 2019-10-20 ENCOUNTER — Ambulatory Visit (INDEPENDENT_AMBULATORY_CARE_PROVIDER_SITE_OTHER): Payer: Medicare Other | Admitting: Internal Medicine

## 2019-10-20 ENCOUNTER — Other Ambulatory Visit: Payer: Self-pay

## 2019-10-20 ENCOUNTER — Encounter: Payer: Self-pay | Admitting: Internal Medicine

## 2019-10-20 VITALS — BP 160/78 | HR 62 | Temp 97.6°F | Ht 68.0 in | Wt 180.0 lb

## 2019-10-20 DIAGNOSIS — D649 Anemia, unspecified: Secondary | ICD-10-CM

## 2019-10-20 DIAGNOSIS — N183 Chronic kidney disease, stage 3 unspecified: Secondary | ICD-10-CM

## 2019-10-20 DIAGNOSIS — E538 Deficiency of other specified B group vitamins: Secondary | ICD-10-CM

## 2019-10-20 DIAGNOSIS — G20 Parkinson's disease: Secondary | ICD-10-CM | POA: Diagnosis not present

## 2019-10-20 DIAGNOSIS — F0280 Dementia in other diseases classified elsewhere without behavioral disturbance: Secondary | ICD-10-CM | POA: Diagnosis not present

## 2019-10-20 DIAGNOSIS — I10 Essential (primary) hypertension: Secondary | ICD-10-CM

## 2019-10-20 LAB — BASIC METABOLIC PANEL
BUN: 22 mg/dL (ref 6–23)
CO2: 32 mEq/L (ref 19–32)
Calcium: 9.1 mg/dL (ref 8.4–10.5)
Chloride: 102 mEq/L (ref 96–112)
Creatinine, Ser: 1.74 mg/dL — ABNORMAL HIGH (ref 0.40–1.50)
GFR: 45.92 mL/min — ABNORMAL LOW (ref >60.00)
Glucose, Bld: 125 mg/dL — ABNORMAL HIGH (ref 70–99)
Potassium: 4.3 mEq/L (ref 3.5–5.1)
Sodium: 138 mEq/L (ref 135–145)

## 2019-10-20 NOTE — Assessment & Plan Note (Signed)
-   Sinemet 

## 2019-10-20 NOTE — Assessment & Plan Note (Signed)
On B12 

## 2019-10-20 NOTE — Assessment & Plan Note (Signed)
BMET 

## 2019-10-20 NOTE — Progress Notes (Signed)
Subjective:  Patient ID: [name], [name]    DOB: [birth year]  Age: 79 y.o. MRN: [id number]  CC: No chief complaint on file.   HPI [name] presents for dementia, B12 def, Parkinson's  Outpatient Medications Prior to Visit  Medication Sig Dispense Refill  . B Complex-C (B-COMPLEX WITH VITAMIN C) tablet Take 1 tablet by mouth daily.    . carbidopa-levodopa (SINEMET IR) 25-100 MG tablet Take 1 tablet three times a day with meals 90 tablet 11  . Cholecalciferol (VITAMIN D) 1000 UNITS capsule Take 1 capsule (1,000 Units total) by mouth daily. 100 capsule 3  . cyanocobalamin (,VITAMIN B-12,) 1000 MCG/ML injection 1 ml sq for 5 days, then 1 ml weekly for 4 weeks, then 1 ml every 2 weeks (Patient taking differently: Inject 1,000 mcg into the muscle See admin instructions. 1 ml sq for 5 days, then 1 ml weekly for 4 weeks, then 1 ml every 2 weeks) 10 mL 6  . doxazosin (CARDURA) 4 MG tablet Take 1 tablet (4 mg total) by mouth daily. 90 tablet 3  . loratadine-pseudoephedrine (CLARITIN-D 24-HOUR) 10-240 MG 24 hr tablet Take 1 tablet by mouth daily as needed for allergies.    . rivastigmine (EXELON) 1.5 MG capsule Take 1 capsule (1.5 mg total) by mouth 2 (two) times daily. 60 capsule 11  . SYRINGE-NEEDLE, DISP, 3 ML (BD ECLIPSE SYRINGE) 25G X 1" 3 ML MISC Use sq as directed 50 each 3  . torsemide (DEMADEX) 100 MG tablet Take 0.5-1 tablets (50-100 mg total) by mouth daily as needed. 90 tablet 3  . traZODone (DESYREL) 50 MG tablet TAKE 2 TABLETS BY MOUTH AT BEDTIME 180 tablet 2   No facility-administered medications prior to visit.    ROS: Review of Systems  Constitutional: Positive for unexpected weight change. Negative for appetite change and fatigue.  HENT: Negative for congestion, nosebleeds, sneezing, sore throat and trouble swallowing.   Eyes: Negative for itching and visual disturbance.  Respiratory: Negative for cough.   Cardiovascular: Negative for chest pain, palpitations and leg  swelling.  Gastrointestinal: Negative for abdominal distention, blood in stool, diarrhea and nausea.  Genitourinary: Negative for frequency and hematuria.  Musculoskeletal: Negative for back pain, gait problem, joint swelling and neck pain.  Skin: Negative for rash.  Neurological: Negative for dizziness, tremors, speech difficulty and weakness.  Psychiatric/Behavioral: Positive for behavioral problems, confusion and decreased concentration. Negative for agitation, dysphoric mood and sleep disturbance. The patient is not nervous/anxious.     Objective:  BP (!) 160/78 (BP Location: Right Arm, Patient Position: Sitting, Cuff Size: Normal)   Pulse 62   Temp 97.6 F (36.4 C) (Oral)   Ht 5\' 8"  (1.727 m)   Wt 180 lb (81.6 kg)   SpO2 96%   BMI 27.37 kg/m   BP Readings from Last 3 Encounters:  10/20/19 (!) 160/78  10/19/19 (!) 188/93  07/22/19 (!) 160/84    Wt Readings from Last 3 Encounters:  10/20/19 180 lb (81.6 kg)  10/19/19 181 lb 9.6 oz (82.4 kg)  07/22/19 189 lb (85.7 kg)    Physical Exam Constitutional:      General: He is not in acute distress.    Appearance: He is well-developed.     Comments: NAD  Eyes:     Conjunctiva/sclera: Conjunctivae normal.     Pupils: Pupils are equal, round, and reactive to light.  Neck:     Thyroid: No thyromegaly.     Vascular: No JVD.  Cardiovascular:     Rate and Rhythm: Normal rate and regular rhythm.     Heart sounds: Normal heart sounds. No murmur. No friction rub. No gallop.   Pulmonary:     Effort: Pulmonary effort is normal. No respiratory distress.     Breath sounds: Normal breath sounds. No wheezing or rales.  Chest:     Chest wall: No tenderness.  Abdominal:     General: Bowel sounds are normal. There is no distension.     Palpations: Abdomen is soft. There is no mass.     Tenderness: There is no abdominal tenderness. There is no guarding or rebound.  Musculoskeletal:        General: No tenderness. Normal range of  motion.     Cervical back: Normal range of motion.  Lymphadenopathy:     Cervical: No cervical adenopathy.  Skin:    General: Skin is warm and dry.     Findings: No rash.  Neurological:     Mental Status: He is alert. He is disoriented.     Cranial Nerves: No cranial nerve deficit.     Motor: No abnormal muscle tone.     Coordination: Coordination abnormal.     Gait: Gait normal.     Deep Tendon Reflexes: Reflexes are normal and symmetric.  Psychiatric:        Behavior: Behavior normal.     Lab Results  Component Value Date   WBC 4.4 07/22/2019   HGB 11.8 (L) 07/22/2019   HCT 34.6 (L) 07/22/2019   PLT 257.0 07/22/2019   GLUCOSE 100 (H) 07/22/2019   CHOL 183 08/04/2018   TRIG 143.0 08/04/2018   HDL 30.40 (L) 08/04/2018   LDLDIRECT 114.0 12/30/2016   LDLCALC 124 (H) 08/04/2018   ALT 2 07/22/2019   AST 11 07/22/2019   NA 139 07/22/2019   K 4.2 07/22/2019   CL 102 07/22/2019   CREATININE 1.51 (H) 07/22/2019   BUN 15 07/22/2019   CO2 32 07/22/2019   TSH 1.545 07/04/2019   PSA 0.31 08/04/2018   INR 1.4 (H) 07/03/2019   HGBA1C 5.5 02/13/2019   MICROALBUR 2.9 (H) 12/30/2016    CT ABDOMEN PELVIS WO CONTRAST  Result Date: 07/04/2019 CLINICAL DATA:  Constipation or obstruction. EXAM: CT ABDOMEN AND PELVIS WITHOUT CONTRAST TECHNIQUE: Multidetector CT imaging of the abdomen and pelvis was performed following the standard protocol without IV contrast. COMPARISON:  None. FINDINGS: LOWER CHEST: Bibasilar atelectasis. HEPATOBILIARY: Normal hepatic contours. No intra- or extrahepatic biliary dilatation. Normal gallbladder. PANCREAS: Normal pancreas. No ductal dilatation or peripancreatic fluid collection. SPLEEN: Normal. ADRENALS/URINARY TRACT: The adrenal glands are normal. There is a stone within the distal left ureter, at the ureterovesical junction, measuring 2 mm, causing mild hydroureteronephrosis and mild perinephric stranding. The right kidney is normal. The urinary bladder is  normal for degree of distention STOMACH/BOWEL: There is no hiatal hernia. Normal duodenal course and caliber. No small bowel dilatation or inflammation. No focal colonic abnormality. Normal appendix. VASCULAR/LYMPHATIC: There is calcific atherosclerosis of the abdominal aorta. No abdominal or pelvic lymphadenopathy. REPRODUCTIVE: Normal prostate size with symmetric seminal vesicles. MUSCULOSKELETAL. Multilevel degenerative disc disease and facet arthrosis. No bony spinal canal stenosis. OTHER: None. IMPRESSION: 1. Left-sided obstructive uropathy with 2 mm stone at the left ureterovesical junction causing mild hydroureteronephrosis and perinephric stranding. 2.  Aortic atherosclerosis (ICD10-I70.0). Electronically Signed   By: Deatra Robinson M.D.   On: 07/04/2019 01:56   DG Abd 1 View  Result Date: 07/03/2019 CLINICAL DATA:  Constipation EXAM: ABDOMEN - 1 VIEW COMPARISON:  None. FINDINGS: Mild diffuse gaseous distention of bowel, likely ileus. No significant increase in stool burden. No organomegaly or free air. No acute bony abnormality. IMPRESSION: Diffuse gaseous distention of bowel, likely ileus. Electronically Signed   By: Charlett Nose M.D.   On: 07/03/2019 20:17   US Renal  Result Date: 07/03/2019 CLINICAL DATA:  79 year old [name] with acute renal insufficiency. EXAM: RENAL / URINARY TRACT ULTRASOUND COMPLETE COMPARISON:  Renal ultrasound dated 02/13/2019. FINDINGS: Right Kidney: Renal measurements: 12.6 x 5.1 x 5.1 cm = volume: 172 mL. Mild increased echogenicity. No hydronephrosis or shadowing stone. Left Kidney: Renal measurements: 12.1 x 6.5 x 7.0 cm = volume: 287 mL. Mildly echogenic. There is mild left hydronephrosis. No shadowing stone. Bladder: The urinary bladder is partially distended and grossly unremarkable. Other: None. IMPRESSION: 1. Mild increased renal echogenicity may be related to chronic kidney disease. 2. Mild left hydronephrosis.  No shadowing stone. Electronically Signed   By: Elgie Collard M.D.   On: 07/03/2019 20:49   DG Chest Port 1 View  Result Date: 07/03/2019 CLINICAL DATA:  Weakness.  Hypertension. EXAM: PORTABLE CHEST 1 VIEW COMPARISON:  February 13, 2019 FINDINGS: There is left base scarring. Lungs elsewhere are clear. Heart is upper normal in size with pulmonary vascularity normal. No adenopathy. No bone lesions. IMPRESSION: Scarring left base, stable. Lungs elsewhere clear. Heart upper normal in size. No adenopathy. Electronically Signed   By: Bretta Bang III M.D.   On: 07/03/2019 13:52    Assessment & Plan:    Sonda Primes, MD

## 2019-10-20 NOTE — Assessment & Plan Note (Signed)
cont w/BP meds as before

## 2019-10-20 NOTE — Addendum Note (Signed)
Addended by: Merrilyn Puma on: 10/20/2019 01:55 PM   Modules accepted: Orders

## 2019-10-20 NOTE — Assessment & Plan Note (Signed)
-   [place] 

## 2019-10-21 LAB — CBC WITH DIFFERENTIAL/PLATELET
Basophils Absolute: 0 10*3/uL (ref 0.0–0.1)
Basophils Relative: 0.9 % (ref 0.0–3.0)
Eosinophils Absolute: 0 10*3/uL (ref 0.0–0.7)
Eosinophils Relative: 0.6 % (ref 0.0–5.0)
HCT: 38.2 % — ABNORMAL LOW (ref 39.0–52.0)
Hemoglobin: 13 g/dL (ref 13.0–17.0)
Lymphocytes Relative: 34.6 % (ref 12.0–46.0)
Lymphs Abs: 1.4 10*3/uL (ref 0.7–4.0)
MCHC: 34 g/dL (ref 30.0–36.0)
MCV: 89.2 fl (ref 78.0–100.0)
Monocytes Absolute: 0.3 10*3/uL (ref 0.1–1.0)
Monocytes Relative: 8.5 % (ref 3.0–12.0)
Neutro Abs: 2.3 10*3/uL (ref 1.4–7.7)
Neutrophils Relative %: 55.4 % (ref 43.0–77.0)
Platelets: 168 10*3/uL (ref 150.0–400.0)
RBC: 4.29 Mil/uL (ref 4.22–5.81)
RDW: 12.5 % (ref 11.5–15.5)
WBC: 4.1 10*3/uL (ref 4.0–10.5)

## 2019-10-21 LAB — IRON,TIBC AND FERRITIN PANEL
%SAT: 32 % (calc) (ref 20–48)
Ferritin: 120 ng/mL (ref 24–380)
Iron: 68 ug/dL (ref 50–180)
TIBC: 213 mcg/dL (calc) — ABNORMAL LOW (ref 250–425)

## 2019-11-17 ENCOUNTER — Ambulatory Visit (INDEPENDENT_AMBULATORY_CARE_PROVIDER_SITE_OTHER): Payer: Medicare Other | Admitting: Internal Medicine

## 2019-11-17 ENCOUNTER — Encounter: Payer: Self-pay | Admitting: Internal Medicine

## 2019-11-17 ENCOUNTER — Other Ambulatory Visit: Payer: Self-pay

## 2019-11-17 DIAGNOSIS — E538 Deficiency of other specified B group vitamins: Secondary | ICD-10-CM | POA: Diagnosis not present

## 2019-11-17 DIAGNOSIS — R296 Repeated falls: Secondary | ICD-10-CM

## 2019-11-17 DIAGNOSIS — G20 Parkinson's disease: Secondary | ICD-10-CM

## 2019-11-17 DIAGNOSIS — B029 Zoster without complications: Secondary | ICD-10-CM

## 2019-11-17 DIAGNOSIS — F0280 Dementia in other diseases classified elsewhere without behavioral disturbance: Secondary | ICD-10-CM

## 2019-11-17 DIAGNOSIS — G20A1 Parkinson's disease without dyskinesia, without mention of fluctuations: Secondary | ICD-10-CM

## 2019-11-17 MED ORDER — VALACYCLOVIR HCL 1 G PO TABS
1000.0000 mg | ORAL_TABLET | Freq: Three times a day (TID) | ORAL | 0 refills | Status: DC
Start: 2019-11-17 — End: 2020-05-31

## 2019-11-17 NOTE — Assessment & Plan Note (Signed)
On B12 

## 2019-11-17 NOTE — Patient Instructions (Signed)
Shingles  Shingles is an infection. It gives you a painful skin rash and blisters that have fluid in them. Shingles is caused by the same germ (virus) that causes chickenpox. Shingles only happens in people who:  Have had chickenpox.  Have been given a shot of medicine (vaccine) to protect against chickenpox. Shingles is rare in this group. The first symptoms of shingles may be itching, tingling, or pain in an area on your skin. A rash will show on your skin a few days or weeks later. The rash is likely to be on one side of your body. The rash usually has a shape like a belt or a band. Over time, the rash turns into fluid-filled blisters. The blisters will break open, change into scabs, and dry up. Medicines may:  Help with pain and itching.  Help you get better sooner.  Help to prevent long-term problems. Follow these instructions at home: Medicines  Take over-the-counter and prescription medicines only as told by your doctor.  Put on an anti-itch cream or numbing cream where you have a rash, blisters, or scabs. Do this as told by your doctor. Helping with itching and discomfort   Put cold, wet cloths (cold compresses) on the area of the rash or blisters as told by your doctor.  Cool baths can help you feel better. Try adding baking soda or dry oatmeal to the water to lessen itching. Do not bathe in hot water. Blister and rash care  Keep your rash covered with a loose bandage (dressing).  Wear loose clothing that does not rub on your rash.  Keep your rash and blisters clean. To do this, wash the area with mild soap and cool water as told by your doctor.  Check your rash every day for signs of infection. Check for: ? More redness, swelling, or pain. ? Fluid or blood. ? Warmth. ? Pus or a bad smell.  Do not scratch your rash. Do not pick at your blisters. To help you to not scratch: ? Keep your fingernails clean and cut short. ? Wear gloves or mittens when you sleep, if  scratching is a problem. General instructions  Rest as told by your doctor.  Keep all follow-up visits as told by your doctor. This is important.  Wash your hands often with soap and water. If soap and water are not available, use hand sanitizer. Doing this lowers your chance of getting a skin infection caused by germs (bacteria).  Your infection can cause chickenpox in people who have never had chickenpox or never got a shot of chickenpox vaccine. If you have blisters that did not change into scabs yet, try not to touch other people or be around other people, especially: ? Babies. ? Pregnant women. ? Children who have areas of red, itchy, or rough skin (eczema). ? Very old people who have transplants. ? People who have a long-term (chronic) sickness, like cancer or AIDS. Contact a doctor if:  Your pain does not get better with medicine.  Your pain does not get better after the rash heals.  You have any signs of infection in the rash area. These signs include: ? More redness, swelling, or pain around the rash. ? Fluid or blood coming from the rash. ? The rash area feeling warm to the touch. ? Pus or a bad smell coming from the rash. Get help right away if:  The rash is on your face or nose.  You have pain in your face or pain by   your eye.  You lose feeling on one side of your face.  You have trouble seeing.  You have ear pain, or you have ringing in your ear.  You have a loss of taste.  Your condition gets worse. Summary  Shingles gives you a painful skin rash and blisters that have fluid in them.  Shingles is an infection. It is caused by the same germ (virus) that causes chickenpox.  Keep your rash covered with a loose bandage (dressing). Wear loose clothing that does not rub on your rash.  If you have blisters that did not change into scabs yet, try not to touch other people or be around people. This information is not intended to replace advice given to you by  your health care provider. Make sure you discuss any questions you have with your health care provider. Document Revised: 10/02/2018 Document Reviewed: 02/12/2017 Elsevier Patient Education  2020 [employer]  

## 2019-11-17 NOTE — Progress Notes (Signed)
Subjective:  Patient ID: [name], male    DOB: 08-17-1940  Age: 79 y.o. MRN: [id number]  CC: Shoulder Pain (Right radiates down his right side into his right leg x 1-2 weeks)   HPI [name] presents for R shoulder pain, R leg pain for some time - poor historian. Comes w/dtr F/u dementia, B12 def   Outpatient Medications Prior to Visit  Medication Sig Dispense Refill  . B Complex-C (B-COMPLEX WITH VITAMIN C) tablet Take 1 tablet by mouth daily.    . carbidopa-levodopa (SINEMET IR) 25-100 MG tablet Take 1 tablet three times a day with meals 90 tablet 11  . Cholecalciferol (VITAMIN D) 1000 UNITS capsule Take 1 capsule (1,000 Units total) by mouth daily. 100 capsule 3  . cyanocobalamin (,VITAMIN B-12,) 1000 MCG/ML injection 1 ml sq for 5 days, then 1 ml weekly for 4 weeks, then 1 ml every 2 weeks (Patient taking differently: Inject 1,000 mcg into the muscle See admin instructions. 1 ml sq for 5 days, then 1 ml weekly for 4 weeks, then 1 ml every 2 weeks) 10 mL 6  . doxazosin (CARDURA) 4 MG tablet Take 1 tablet (4 mg total) by mouth daily. 90 tablet 3  . loratadine-pseudoephedrine (CLARITIN-D 24-HOUR) 10-240 MG 24 hr tablet Take 1 tablet by mouth daily as needed for allergies.    . rivastigmine (EXELON) 1.5 MG capsule Take 1 capsule (1.5 mg total) by mouth 2 (two) times daily. 60 capsule 11  . SYRINGE-NEEDLE, DISP, 3 ML (BD ECLIPSE SYRINGE) 25G X 1" 3 ML MISC Use sq as directed 50 each 3  . torsemide (DEMADEX) 100 MG tablet Take 0.5-1 tablets (50-100 mg total) by mouth daily as needed. 90 tablet 3  . traZODone (DESYREL) 50 MG tablet TAKE 2 TABLETS BY MOUTH AT BEDTIME 180 tablet 2   No facility-administered medications prior to visit.    ROS: Review of Systems  Constitutional: Positive for fatigue. Negative for appetite change and unexpected weight change.  HENT: Negative for congestion, nosebleeds, sneezing, sore throat and trouble swallowing.   Eyes: Negative for itching and  visual disturbance.  Respiratory: Negative for cough.   Cardiovascular: Negative for chest pain, palpitations and leg swelling.  Gastrointestinal: Negative for abdominal distention, blood in stool, diarrhea and nausea.  Genitourinary: Negative for frequency and hematuria.  Musculoskeletal: Positive for arthralgias and [name] pain. Negative for gait problem, joint swelling and neck pain.  Skin: Positive for rash.  Neurological: Negative for dizziness, tremors, speech difficulty and weakness.  Psychiatric/Behavioral: Positive for confusion and decreased concentration. Negative for agitation, dysphoric mood and sleep disturbance. The patient is not nervous/anxious.     Objective:  BP 140/80 (BP Location: Left Arm, Patient Position: Sitting, Cuff Size: Normal)   Pulse 67   Temp 98.2 F (36.8 C) (Oral)   Ht 5\' 8"  (1.727 m)   Wt 176 lb (79.8 kg)   SpO2 96%   BMI 26.76 kg/m   BP Readings from Last 3 Encounters:  11/17/19 140/80  10/20/19 (!) 160/78  10/19/19 (!) 188/93    Wt Readings from Last 3 Encounters:  11/17/19 176 lb (79.8 kg)  10/20/19 180 lb (81.6 kg)  10/19/19 181 lb 9.6 oz (82.4 kg)    Physical Exam Constitutional:      General: He is not in acute distress.    Appearance: He is well-developed.     Comments: NAD  Eyes:     Conjunctiva/sclera: Conjunctivae normal.     Pupils:  Pupils are equal, round, and reactive to light.  Neck:     Thyroid: No thyromegaly.     Vascular: No JVD.  Cardiovascular:     Rate and Rhythm: Normal rate and regular rhythm.     Heart sounds: Normal heart sounds. No murmur. No friction rub. No gallop.   Pulmonary:     Effort: Pulmonary effort is normal. No respiratory distress.     Breath sounds: Normal breath sounds. No wheezing or rales.  Chest:     Chest wall: No tenderness.  Abdominal:     General: Bowel sounds are normal. There is no distension.     Palpations: Abdomen is soft. There is no mass.     Tenderness: There is no abdominal  tenderness. There is no guarding or rebound.  Musculoskeletal:        General: Tenderness present. Normal range of motion.     Cervical [name]: Normal range of motion.  Lymphadenopathy:     Cervical: No cervical adenopathy.  Skin:    General: Skin is warm and dry.     Findings: Rash present.  Neurological:     Mental Status: He is alert and oriented to person, place, and time.     Cranial Nerves: No cranial nerve deficit.     Motor: Weakness present. No abnormal muscle tone.     Coordination: Coordination abnormal.     Gait: Gait abnormal.     Deep Tendon Reflexes: Reflexes are normal and symmetric.  Psychiatric:        Behavior: Behavior normal.        Thought Content: Thought content normal.        Judgment: Judgment normal.     R inner elbow blisters R shoulder blade papule   Lab Results  Component Value Date   WBC 4.1 10/20/2019   HGB 13.0 10/20/2019   HCT 38.2 (L) 10/20/2019   PLT 168.0 10/20/2019   GLUCOSE 125 (H) 10/20/2019   CHOL 183 08/04/2018   TRIG 143.0 08/04/2018   HDL 30.40 (L) 08/04/2018   LDLDIRECT 114.0 12/30/2016   LDLCALC 124 (H) 08/04/2018   ALT 2 07/22/2019   AST 11 07/22/2019   NA 138 10/20/2019   K 4.3 10/20/2019   CL 102 10/20/2019   CREATININE 1.74 (H) 10/20/2019   BUN 22 10/20/2019   CO2 32 10/20/2019   TSH 1.545 07/04/2019   PSA 0.31 08/04/2018   INR 1.4 (H) 07/03/2019   HGBA1C 5.5 02/13/2019   MICROALBUR 2.9 (H) 12/30/2016    CT ABDOMEN PELVIS WO CONTRAST  Result Date: 07/04/2019 CLINICAL DATA:  Constipation or obstruction. EXAM: CT ABDOMEN AND PELVIS WITHOUT CONTRAST TECHNIQUE: Multidetector CT imaging of the abdomen and pelvis was performed following the standard protocol without IV contrast. COMPARISON:  None. FINDINGS: LOWER CHEST: Bibasilar atelectasis. HEPATOBILIARY: Normal hepatic contours. No intra- or extrahepatic biliary dilatation. Normal gallbladder. PANCREAS: Normal pancreas. No ductal dilatation or peripancreatic fluid  collection. SPLEEN: Normal. ADRENALS/URINARY TRACT: The adrenal glands are normal. There is a stone within the distal left ureter, at the ureterovesical junction, measuring 2 mm, causing mild hydroureteronephrosis and mild perinephric stranding. The right kidney is normal. The urinary bladder is normal for degree of distention STOMACH/BOWEL: There is no hiatal hernia. Normal duodenal course and caliber. No small bowel dilatation or inflammation. No focal colonic abnormality. Normal appendix. VASCULAR/LYMPHATIC: There is calcific atherosclerosis of the abdominal aorta. No abdominal or pelvic lymphadenopathy. REPRODUCTIVE: Normal prostate size with symmetric seminal vesicles. MUSCULOSKELETAL. Multilevel degenerative disc  disease and facet arthrosis. No bony spinal canal stenosis. OTHER: None. IMPRESSION: 1. Left-sided obstructive uropathy with 2 mm stone at the left ureterovesical junction causing mild hydroureteronephrosis and perinephric stranding. 2.  Aortic atherosclerosis (ICD10-I70.0). Electronically Signed   By: Ulyses Jarred M.D.   On: 07/04/2019 01:56   DG Abd 1 View  Result Date: 07/03/2019 CLINICAL DATA:  Constipation EXAM: ABDOMEN - 1 VIEW COMPARISON:  None. FINDINGS: Mild diffuse gaseous distention of bowel, likely ileus. No significant increase in stool burden. No organomegaly or free air. No acute bony abnormality. IMPRESSION: Diffuse gaseous distention of bowel, likely ileus. Electronically Signed   By: Rolm Baptise M.D.   On: 07/03/2019 20:17   US Renal  Result Date: 07/03/2019 CLINICAL DATA:  79 year old male with acute renal insufficiency. EXAM: RENAL / URINARY TRACT ULTRASOUND COMPLETE COMPARISON:  Renal ultrasound dated 02/13/2019. FINDINGS: Right Kidney: Renal measurements: 12.6 x 5.1 x 5.1 cm = volume: 172 mL. Mild increased echogenicity. No hydronephrosis or shadowing stone. Left Kidney: Renal measurements: 12.1 x 6.5 x 7.0 cm = volume: 287 mL. Mildly echogenic. There is mild left  hydronephrosis. No shadowing stone. Bladder: The urinary bladder is partially distended and grossly unremarkable. Other: None. IMPRESSION: 1. Mild increased renal echogenicity may be related to chronic kidney disease. 2. Mild left hydronephrosis.  No shadowing stone. Electronically Signed   By: Anner Crete M.D.   On: 07/03/2019 20:49   DG Chest Port 1 View  Result Date: 07/03/2019 CLINICAL DATA:  Weakness.  Hypertension. EXAM: PORTABLE CHEST 1 VIEW COMPARISON:  February 13, 2019 FINDINGS: There is left base scarring. Lungs elsewhere are clear. Heart is upper normal in size with pulmonary vascularity normal. No adenopathy. No bone lesions. IMPRESSION: Scarring left base, stable. Lungs elsewhere clear. Heart upper normal in size. No adenopathy. Electronically Signed   By: Lowella Grip III M.D.   On: 07/03/2019 13:52    Assessment & Plan:   There are no diagnoses linked to this encounter.   No orders of the defined types were placed in this encounter.    Follow-up: No follow-ups on file.  Walker Kehr, MD

## 2019-11-17 NOTE — Assessment & Plan Note (Addendum)
Pain in R upper back, R arm Pain in R LS spine Zoster s/p ?post-COVID19 2nd vaccinedose Valtrex Tylenol

## 2019-11-17 NOTE — Assessment & Plan Note (Signed)
No recent fall 

## 2019-11-17 NOTE — Assessment & Plan Note (Signed)
No recent changes in confusion

## 2019-12-14 ENCOUNTER — Ambulatory Visit (INDEPENDENT_AMBULATORY_CARE_PROVIDER_SITE_OTHER): Payer: Medicare Other

## 2019-12-14 ENCOUNTER — Other Ambulatory Visit: Payer: Self-pay

## 2019-12-14 ENCOUNTER — Ambulatory Visit: Payer: Medicare Other | Admitting: Family

## 2019-12-14 ENCOUNTER — Encounter: Payer: Self-pay | Admitting: Family

## 2019-12-14 VITALS — BP 142/82 | HR 63 | Temp 98.5°F | Wt 178.4 lb

## 2019-12-14 DIAGNOSIS — M25511 Pain in right shoulder: Secondary | ICD-10-CM

## 2019-12-14 MED ORDER — PREDNISONE 20 MG PO TABS
20.0000 mg | ORAL_TABLET | Freq: Every day | ORAL | 0 refills | Status: DC
Start: 2019-12-14 — End: 2019-12-21

## 2019-12-14 NOTE — Progress Notes (Signed)
[name] is a 79 y.o. male with the following history as recorded in [employer]:  Patient Active Problem List   Diagnosis Date Noted  . Shingles 11/17/2019  . Hydronephrosis with renal and ureteral calculus obstruction 07/06/2019  . Constipation 07/03/2019  . Acute metabolic encephalopathy 07/03/2019  . CRF (chronic renal failure), stage 3 (moderate) 03/18/2019  . Pressure injury of skin 02/14/2019  . ARF (acute renal failure) (HCC) 02/13/2019  . Sepsis (HCC) 02/13/2019  . Protein-calorie malnutrition, severe (HCC) 02/13/2019  . 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 Parkinson's syndrome (HCC) 02/13/2019  . Infected sebaceous cyst 12/04/2018  . Hallucinations 11/19/2018  . Shoulder pain 11/19/2018  . Vitamin B12 deficiency 11/19/2018  . 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 Memory loss 09/21/2018  . Falls frequently 09/21/2018  . Fatigue 10/27/2017  . Edema 07/29/2017  . Chronic venous insufficiency 06/30/2017  . Cellulitis and abscess of leg, except foot 06/30/2017  . Ear pain, referred, right 06/10/2016  . Urinary frequency 04/16/2016  . Cerumen impaction 08/22/2015  . Infection of right hand due to bite 04/20/2014  . Hemifacial spasm 09/15/2013  . Well adult exam 07/16/2013  . Dermatochalasis of eyelid 01/01/2012  . Other facial nerve disorders 01/01/2012  . 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 Twitching 11/04/2011  . Non-REM Parasomnia 10/14/2011  . Dog bite of left hand 08/19/2011  . Wound infection 08/19/2011  . Hypokalemia 12/10/2010  . Hyperglycemia 12/10/2010  . Dyslipidemia   . Benign prostatic hyperplasia   . LEG CRAMPS 03/07/2009  . OTITIS EXTERNA 08/30/2008  . Prostatitis 08/30/2008  . Essential hypertension 08/26/2007  . ERECTILE DYSFUNCTION 08/26/2007    Current Outpatient Medications  Medication Sig Dispense Refill  . B Complex-C (B-COMPLEX WITH VITAMIN C) tablet Take 1 tablet by mouth daily.     . carbidopa-levodopa (SINEMET IR) 25-100 MG tablet Take 1 tablet three times a day with meals 90 tablet 11  . Cholecalciferol (VITAMIN D) 1000 UNITS capsule Take 1 capsule (1,000 Units total) by mouth daily. 100 capsule 3  . cyanocobalamin (,VITAMIN B-12,) 1000 MCG/ML injection 1 ml sq for 5 days, then 1 ml weekly for 4 weeks, then 1 ml every 2 weeks (Patient taking differently: Inject 1,000 mcg into the muscle See admin instructions. 1 ml sq for 5 days, then 1 ml weekly for 4 weeks, then 1 ml every 2 weeks) 10 mL 6  . doxazosin (CARDURA) 4 MG tablet Take 1 tablet (4 mg total) by mouth daily. 90 tablet 3  . loratadine-pseudoephedrine (CLARITIN-D 24-HOUR) 10-240 MG 24 hr tablet Take 1 tablet by mouth daily as needed for allergies.    . rivastigmine (EXELON) 1.5 MG capsule Take 1 capsule (1.5 mg total) by mouth 2 (two) times daily. 60 capsule 11  . SYRINGE-NEEDLE, DISP, 3 ML (BD ECLIPSE SYRINGE) 25G X 1" 3 ML MISC Use sq as directed 50 each 3  . torsemide (DEMADEX) 100 MG tablet Take 0.5-1 tablets (50-100 mg total) by mouth daily as needed. 90 tablet 3  . traZODone (DESYREL) 50 MG tablet TAKE 2 TABLETS BY MOUTH AT BEDTIME 180 tablet 2  . valACYclovir (VALTREX) 1000 MG tablet Take 1 tablet (1,000 mg total) by mouth 3 (three) times daily. 21 tablet 0  . predniSONE (DELTASONE) 20 MG tablet Take 1 tablet (20 mg total) by mouth daily with breakfast. 5 tablet 0   No current facility-administered medications for this visit.    Allergies: Amlodipine, Atorvastatin, Penicillins,  Sulfonamide derivatives, Telmisartan, and Sulfa antibiotics  Past Medical History:  Diagnosis Date  . B12 deficiency 11/19/2018  . Benign prostatic hypertrophy   . Edema   . Hyperlipidemia   . Hypertension   . Insomnia   . Parasomnia 10/14/2011   PSG 12/15/11>>AHI 0.4, SpO2 low 89%, PLMI 16, somnoliloquay, nocturia   . Prediabetes 06/10/2012  . Seasonal allergies   . Sepsis (HCC) 01/2019  . UTI (lower urinary tract  infection)     Past Surgical History:  Procedure Laterality Date  . CYSTOSCOPY/URETEROSCOPY/HOLMIUM LASER/STENT PLACEMENT Left 07/05/2019   Procedure: CYSTOSCOPY/URETEROSCOPY/STENT PLACEMENT/REROGRADE;  Surgeon: Crista Elliot, MD;  Location: [facility];  Service: Urology;  Laterality: Left;  . FINGER SURGERY Right 04/19/2014   RIGHT INDEX & RING FINGER   FROM DOG BITE   . I & D EXTREMITY Right 04/20/2014   Procedure: IRRIGATION AND DEBRIDEMENT right index finger and right ring finger;  Surgeon: Dominica Severin, MD;  Location: [facility];  Service: Orthopedics;  Laterality: Right;  . I & D EXTREMITY Right 04/21/2014   Procedure: IRRIGATION AND DEBRIDEMENT RIGHT HAND RING FINGER, INDEX FINGER AND Flexor Tenolysis and FDS Tenotomy;  Surgeon: Dominica Severin, MD;  Location: [facility];  Service: Orthopedics;  Laterality: Right;    Family History  Problem Relation Age of Onset  . Diabetes Mother   . Tremor Father   . Diabetes Other     Social History   Tobacco Use  . Smoking status: Former Smoker    Packs/day: 1.00    Years: 7.00    Pack years: 7.00    Types: Cigarettes, Cigars  . Smokeless tobacco: Never Used  . Tobacco comment: cigars day/ 1-2       QIUIT SMOKING IN 2000  Substance Use Topics  . Alcohol use: Not Currently    Comment: OCCASSIONAL WINE -1 glass/month    Subjective:  Right shoulder pain x 1 week; accompanied by sister in law; no known injury or trauma; notes that is just feels like the area "throbs" but no numbness or tingling radiating into fingers; does have CKD so is not a candidate for NSAIDs; able to move the shoulder completely;   Objective:  Vitals:   12/14/19 1210  BP: (!) 142/82  Pulse: 63  Temp: 98.5 F (36.9 C)  TempSrc: Oral  SpO2: 96%  Weight: 178 lb 6.4 oz (80.9 kg)    General: Well developed, well nourished, in no acute distress  Skin : Warm and dry.  Head: Normocephalic and atraumatic  Lungs: Respirations unlabored;  Musculoskeletal: No deformities;  no active joint inflammation  Extremities: No edema, cyanosis, clubbing  Vessels: Symmetric bilaterally  Neurologic: Alert and oriented; speech intact; face symmetrical; moves all extremities well; CNII-XII intact without focal deficit   Assessment:  1. Acute pain of right shoulder     Plan:  Update X-ray of right shoulder and right humerus; Rx for Prednisone 20 mg qd x 5 days; may need to refer to sports medicine; follow-up to be determined.  This visit occurred during the SARS-CoV-2 public health emergency.  Safety protocols were in place, including screening questions prior to the visit, additional usage of staff PPE, and extensive cleaning of exam room while observing appropriate contact time as indicated for disinfecting solutions.     No follow-ups on file.  Orders Placed This Encounter  Procedures  . DG Humerus Right    Standing Status:   Future    Number of Occurrences:   1  Standing Expiration Date:   12/13/2020    Order Specific Question:   Reason for Exam (SYMPTOM  OR DIAGNOSIS REQUIRED)    Answer:   right shoulder/ upper arm pain    Order Specific Question:   Preferred imaging location?    Answer:   [facility]    Order Specific Question:   Radiology Contrast Protocol - do NOT remove file path    Answer:   [email or web address]  . DG Shoulder Right    Standing Status:   Future    Number of Occurrences:   1    Standing Expiration Date:   12/13/2020    Order Specific Question:   Reason for Exam (SYMPTOM  OR DIAGNOSIS REQUIRED)    Answer:   right shoulder pain    Order Specific Question:   Preferred imaging location?    Answer:   [facility]    Order Specific Question:   Radiology Contrast Protocol - do NOT remove file path    Answer:   [email or web address]    Requested Prescriptions   Signed Prescriptions Disp Refills  . predniSONE (DELTASONE) 20 MG tablet 5 tablet 0     Sig: Take 1 tablet (20 mg total) by mouth daily with breakfast.

## 2019-12-21 ENCOUNTER — Other Ambulatory Visit: Payer: Self-pay

## 2019-12-21 ENCOUNTER — Ambulatory Visit: Payer: Self-pay

## 2019-12-21 ENCOUNTER — Encounter: Payer: Self-pay | Admitting: Family Medicine

## 2019-12-21 ENCOUNTER — Ambulatory Visit (INDEPENDENT_AMBULATORY_CARE_PROVIDER_SITE_OTHER): Payer: Medicare Other | Admitting: Family Medicine

## 2019-12-21 VITALS — BP 130/70 | HR 63 | Ht 68.0 in | Wt 182.2 lb

## 2019-12-21 DIAGNOSIS — M25511 Pain in right shoulder: Secondary | ICD-10-CM | POA: Diagnosis not present

## 2019-12-21 NOTE — Progress Notes (Signed)
Subjective:    CC: R shoulder pain  I, [name], LAT, ATC, am serving as scribe for [name].  HPI: Pt is an 79 y/o male presenting w/ c/o R shoulder pain.  He locates his pain to his R superior shoulder. Pt is suffereing from Parkinson's and has had a recent fall about 3 weeks ago which may have contributed to his R shoulder pain.  Pain is a little worse with overhead motion and increased shoulder activity.  He has some pain when he waters the flowers in his garden for example.  No pain radiating below the level of the elbow.  Radiating pain: yes into the R scapula R shoulder mechanical symptoms: No Aggravating factors: any type of resisted motion; R sidelying Treatments tried: prednisone; Extra strength Tylenol  Diagnostic imaging: R shoulder and R humerus XR- 12/14/19  Pertinent review of Systems: No fevers or chills  Relevant historical information: Parkinson's disease   Objective:    Vitals:   12/21/19 1328  BP: 130/70  Pulse: 63  SpO2: 96%   General: Well Developed, well nourished, and in no acute distress.   MSK: Right shoulder normal-appearing Nontender. Range of motion abduction 140 degrees.  Normal external and internal rotation. Strength intact abduction external and internal rotation. Mildly positive Hawkins and Neer's test and empty can test. Negative Yergason's and speeds test.  Lab and Radiology Results DG Shoulder Right  Result Date: 12/15/2019 CLINICAL DATA:  79 year old male with right shoulder pain. No known injury. EXAM: RIGHT HUMERUS - 2+ VIEW; RIGHT SHOULDER - 2+ VIEW COMPARISON:  Right shoulder radiograph dated 11/19/2018. FINDINGS: There is no acute fracture or dislocation. The bones are osteopenic. Mild arthritic changes of the right shoulder. The soft tissues are unremarkable. IMPRESSION: No acute fracture or dislocation. Electronically Signed   By: Elgie Collard M.D.   On: 12/15/2019 21:51   DG Humerus Right  Result Date:  12/15/2019 CLINICAL DATA:  79 year old male with right shoulder pain. No known injury. EXAM: RIGHT HUMERUS - 2+ VIEW; RIGHT SHOULDER - 2+ VIEW COMPARISON:  Right shoulder radiograph dated 11/19/2018. FINDINGS: There is no acute fracture or dislocation. The bones are osteopenic. Mild arthritic changes of the right shoulder. The soft tissues are unremarkable. IMPRESSION: No acute fracture or dislocation. Electronically Signed   By: Elgie Collard M.D.   On: 12/15/2019 21:51   I, [name], personally (independently) visualized and performed the interpretation of the images attached in this note.  Diagnostic Limited MSK Ultrasound of: Right shoulder Biceps tendon intact normal-appearing in bicipital groove. Subscapularis tendon is intact. Supraspinatus tendon is intact with increased subacromial bursa thickness present. Infraspinatus tendon is intact. AC joint degenerative with effusion. Impression: Subacromial bursitis  Procedure: Real-time Ultrasound Guided Injection of right shoulder subacromial bursa Device: Philips Affiniti 50G Images permanently stored and available for review in the ultrasound unit. Verbal informed consent obtained.  Discussed risks and benefits of procedure. Warned about infection bleeding damage to structures skin hypopigmentation and fat atrophy among others. Patient expresses understanding and agreement Time-out conducted.   Noted no overlying erythema, induration, or other signs of local infection.   Skin prepped in a sterile fashion.   Local anesthesia: Topical Ethyl chloride.   With sterile technique and under real time ultrasound guidance:  40 mg of Kenalog and 2 mL of Marcaine injected easily.   Completed without difficulty   Pain immediately resolved suggesting accurate placement of the medication.   Advised to call if fevers/chills, erythema, induration,  drainage, or persistent bleeding.   Images permanently stored and available for review in the ultrasound  unit.  Impression: Technically successful ultrasound guided injection.         Impression and Recommendations:    Assessment and Plan: 79 y.o. male with right shoulder pain ongoing for several months worsening after fall 3 weeks ago.  Fortunately no fracture or full-thickness rotator cuff tear on x-ray or ultrasound.  Patient has already tried Tylenol home exercise program and Voltaren gel.  At this point reasonable next step is subacromial injection.  Recheck back if needed if not improved.[name]  PDMP not reviewed this encounter. Orders Placed This Encounter  Procedures   [place] LIMITED JOINT SPACE STRUCTURES UP RIGHT(NO LINKED CHARGES)    Order Specific Question:   Reason for Exam (SYMPTOM  OR DIAGNOSIS REQUIRED)    Answer:   R shoulder pain    Order Specific Question:   Preferred imaging location?    Answer:   [facility]   No orders of the defined types were placed in this encounter.   Discussed warning signs or symptoms. Please see discharge instructions. Patient expresses understanding.   The above documentation has been reviewed and is accurate and complete [name], M.D.

## 2019-12-21 NOTE — Patient Instructions (Signed)
Thank you for coming in today. Plan for injection today.  OK to continue voltaren gel and tylenol.  Recheck as needed.  Live you life normally.  The shot will take 1-3 days to start working.    Shoulder Impingement Syndrome  Shoulder impingement syndrome is a condition that causes pain when connective tissues (tendons) surrounding the shoulder joint become pinched. These tendons are part of the group of muscles and tissues that help to stabilize the shoulder (rotator cuff). Beneath the rotator cuff is a fluid-filled sac (bursa) that allows the muscles and tendons to glide smoothly. The bursa may become swollen or irritated (bursitis). Bursitis, swelling in the rotator cuff tendons, or both conditions can decrease how much space is under a bone in the shoulder joint (acromion), resulting in impingement. What are the causes? Shoulder impingement syndrome may be caused by bursitis or swelling of the rotator cuff tendons, which may result from:  Repetitive overhead arm movements.  Falling onto the shoulder.  Weakness in the shoulder muscles. What increases the risk? You may be more likely to develop this condition if you:  Play sports that involve throwing, such as baseball.  Participate in sports such as tennis, volleyball, and swimming.  Work as a Education administrator, Music therapist, or Pharmacologist. Some people are also more likely to develop impingement syndrome because of the shape of their acromion bone. What are the signs or symptoms? The main symptom of this condition is pain on the front or side of the shoulder. The pain may:  Get worse when lifting or raising the arm.  Get worse at night.  Wake you up from sleeping.  Feel sharp when the shoulder is moved and then fade to an ache. Other symptoms may include:  Tenderness.  Stiffness.  Inability to raise the arm above shoulder level or behind the body.  Weakness. How is this diagnosed? This condition may be diagnosed based  on:  Your symptoms and medical history.  A physical exam.  Imaging tests, such as: ? X-rays. ? MRI. ? Ultrasound. How is this treated? This condition may be treated by:  Resting your shoulder and avoiding all activities that cause pain or put stress on the shoulder.  Icing your shoulder.  NSAIDs to help reduce pain and swelling.  One or more injections of medicines to numb the area and reduce inflammation.  Physical therapy.  Surgery. This may be needed if nonsurgical treatments have not helped. Surgery may involve repairing the rotator cuff, reshaping the acromion, or removing the bursa. Follow these instructions at home: Managing pain, stiffness, and swelling   If directed, put ice on the injured area. ? Put ice in a plastic bag. ? Place a towel between your skin and the bag. ? Leave the ice on for 20 minutes, 2-3 times a day. Activity  Rest and return to your normal activities as told by your health care provider. Ask your health care provider what activities are safe for you.  Do exercises as told by your health care provider. General instructions  Do not use any products that contain nicotine or tobacco, such as cigarettes, e-cigarettes, and chewing tobacco. These can delay healing. If you need help quitting, ask your health care provider.  Ask your health care provider when it is safe for you to drive.  Take over-the-counter and prescription medicines only as told by your health care provider.  Keep all follow-up visits as told by your health care provider. This is important. How is this prevented?  Give your body time to rest between periods of activity.  Be safe and responsible while being active. This will help you avoid falls.  Maintain physical fitness, including strength and flexibility. Contact a health care provider if:  Your symptoms have not improved after 1-2 months of treatment and rest.  You cannot lift your arm away from your  body. Summary  Shoulder impingement syndrome is a condition that causes pain when connective tissues (tendons) surrounding the shoulder joint become pinched.  The main symptom of this condition is pain on the front or side of the shoulder.  This condition is usually treated with rest, ice, and pain medicines as needed. This information is not intended to replace advice given to you by your health care provider. Make sure you discuss any questions you have with your health care provider. Document Revised: 10/02/2018 Document Reviewed: 12/03/2017 Elsevier Patient Education  2020 [employer].

## 2020-01-04 ENCOUNTER — Telehealth: Payer: Self-pay | Admitting: Internal Medicine

## 2020-01-04 NOTE — Telephone Encounter (Addendum)
    Daughter Nonda Lou calling with questions about the process of getting a home healthcare assistance. She feels the patient would benefit from a home health aide.

## 2020-01-06 NOTE — Telephone Encounter (Signed)
[facility], states she will call me back

## 2020-01-06 NOTE — Telephone Encounter (Signed)
Left pt a detailed message informing him that I could not speak to his daughter in regards to wanting to get him home care and if he want information about the he could give [place] a call

## 2020-01-06 NOTE — Telephone Encounter (Signed)
Insurance and [facility] would not cover HHA normally - they cover RN visits and PT at home short term. Are they planning to private pay for HHA? Thx

## 2020-01-17 ENCOUNTER — Encounter: Payer: Self-pay | Admitting: Internal Medicine

## 2020-01-17 ENCOUNTER — Ambulatory Visit: Payer: Medicare Other | Admitting: Internal Medicine

## 2020-01-17 ENCOUNTER — Other Ambulatory Visit: Payer: Self-pay

## 2020-01-17 ENCOUNTER — Ambulatory Visit: Payer: Medicare Other

## 2020-01-17 DIAGNOSIS — I10 Essential (primary) hypertension: Secondary | ICD-10-CM

## 2020-01-17 DIAGNOSIS — R413 Other amnesia: Secondary | ICD-10-CM | POA: Diagnosis not present

## 2020-01-17 DIAGNOSIS — N183 Chronic kidney disease, stage 3 unspecified: Secondary | ICD-10-CM

## 2020-01-17 DIAGNOSIS — E538 Deficiency of other specified B group vitamins: Secondary | ICD-10-CM

## 2020-01-17 DIAGNOSIS — G20 Parkinson's disease: Secondary | ICD-10-CM | POA: Diagnosis not present

## 2020-01-17 DIAGNOSIS — F0280 Dementia in other diseases classified elsewhere without behavioral disturbance: Secondary | ICD-10-CM

## 2020-01-17 LAB — COMPLETE METABOLIC PANEL WITH GFR
AG Ratio: 1.7 (calc) (ref 1.0–2.5)
ALT: 7 U/L — ABNORMAL LOW (ref 9–46)
AST: 11 U/L (ref 10–35)
Albumin: 3.9 g/dL (ref 3.6–5.1)
Alkaline phosphatase (APISO): 78 U/L (ref 35–144)
BUN/Creatinine Ratio: 14 (calc) (ref 6–22)
BUN: 21 mg/dL (ref 7–25)
CO2: 28 mmol/L (ref 20–32)
Calcium: 8.9 mg/dL (ref 8.6–10.3)
Chloride: 102 mmol/L (ref 98–110)
Creat: 1.55 mg/dL — ABNORMAL HIGH (ref 0.70–1.11)
GFR, Est African American: 48 mL/min/{1.73_m2} — ABNORMAL LOW (ref >=60)
GFR, Est Non African American: 42 mL/min/{1.73_m2} — ABNORMAL LOW (ref >=60)
Globulin: 2.3 g/dL (calc) (ref 1.9–3.7)
Glucose, Bld: 97 mg/dL (ref 65–99)
Potassium: 3.8 mmol/L (ref 3.5–5.3)
Sodium: 140 mmol/L (ref 135–146)
Total Bilirubin: 1.1 mg/dL (ref 0.2–1.2)
Total Protein: 6.2 g/dL (ref 6.1–8.1)

## 2020-01-17 LAB — VITAMIN B12: Vitamin B-12: 1753 pg/mL — ABNORMAL HIGH (ref 200–1100)

## 2020-01-17 NOTE — Addendum Note (Signed)
Addended by: Merrilyn Puma on: 01/17/2020 03:35 PM   Modules accepted: Orders

## 2020-01-17 NOTE — Assessment & Plan Note (Signed)
BP Readings from Last 3 Encounters:  01/17/20 (!) 158/76  12/21/19 130/70  12/14/19 (!) 142/82

## 2020-01-17 NOTE — Assessment & Plan Note (Signed)
On B12 inj 

## 2020-01-17 NOTE — Progress Notes (Signed)
Subjective:  Patient ID: [name], male    DOB: Jul 26, 1940  Age: 79 y.o. MRN: [id number]  CC: No chief complaint on file.   HPI BOWIE DELIA presents for dementia, gait disorder, Parkinson's C/o falling - worse C/o repetitive questions   Outpatient Medications Prior to Visit  Medication Sig Dispense Refill  . B Complex-C (B-COMPLEX WITH VITAMIN C) tablet Take 1 tablet by mouth daily.    . carbidopa-levodopa (SINEMET IR) 25-100 MG tablet Take 1 tablet three times a day with meals 90 tablet 11  . Cholecalciferol (VITAMIN D) 1000 UNITS capsule Take 1 capsule (1,000 Units total) by mouth daily. 100 capsule 3  . cyanocobalamin (,VITAMIN B-12,) 1000 MCG/ML injection 1 ml sq for 5 days, then 1 ml weekly for 4 weeks, then 1 ml every 2 weeks (Patient taking differently: Inject 1,000 mcg into the muscle See admin instructions. 1 ml sq for 5 days, then 1 ml weekly for 4 weeks, then 1 ml every 2 weeks) 10 mL 6  . doxazosin (CARDURA) 4 MG tablet Take 1 tablet (4 mg total) by mouth daily. 90 tablet 3  . loratadine-pseudoephedrine (CLARITIN-D 24-HOUR) 10-240 MG 24 hr tablet Take 1 tablet by mouth daily as needed for allergies.    . rivastigmine (EXELON) 1.5 MG capsule Take 1 capsule (1.5 mg total) by mouth 2 (two) times daily. 60 capsule 11  . SYRINGE-NEEDLE, DISP, 3 ML (BD ECLIPSE SYRINGE) 25G X 1" 3 ML MISC Use sq as directed 50 each 3  . torsemide (DEMADEX) 100 MG tablet Take 0.5-1 tablets (50-100 mg total) by mouth daily as needed. 90 tablet 3  . traZODone (DESYREL) 50 MG tablet TAKE 2 TABLETS BY MOUTH AT BEDTIME 180 tablet 2  . valACYclovir (VALTREX) 1000 MG tablet Take 1 tablet (1,000 mg total) by mouth 3 (three) times daily. 21 tablet 0   No facility-administered medications prior to visit.    ROS: Review of Systems  Constitutional: Positive for fatigue. Negative for appetite change and unexpected weight change.  HENT: Negative for congestion, nosebleeds, sneezing, sore throat and  trouble swallowing.   Eyes: Negative for itching and visual disturbance.  Respiratory: Negative for cough.   Cardiovascular: Negative for chest pain, palpitations and leg swelling.  Gastrointestinal: Negative for abdominal distention, blood in stool, diarrhea and nausea.  Genitourinary: Negative for frequency and hematuria.  Musculoskeletal: Positive for arthralgias and gait problem. Negative for back pain, joint swelling and neck pain.  Skin: Negative for rash.  Neurological: Positive for dizziness and weakness. Negative for tremors and speech difficulty.  Hematological: Does not bruise/bleed easily.  Psychiatric/Behavioral: Positive for behavioral problems, confusion, decreased concentration, hallucinations and sleep disturbance. Negative for agitation, dysphoric mood and suicidal ideas. The patient is not nervous/anxious.     Objective:  BP (!) 158/76 (BP Location: Right Arm, Patient Position: Sitting, Cuff Size: Normal)   Pulse 64   Temp 97.8 F (36.6 C) (Oral)   Ht 5\' 8"  (1.727 m)   Wt 174 lb (78.9 kg)   SpO2 99%   BMI 26.46 kg/m   BP Readings from Last 3 Encounters:  01/17/20 (!) 158/76  12/21/19 130/70  12/14/19 (!) 142/82    Wt Readings from Last 3 Encounters:  01/17/20 174 lb (78.9 kg)  12/21/19 182 lb 3.2 oz (82.6 kg)  12/14/19 178 lb 6.4 oz (80.9 kg)    Physical Exam Constitutional:      General: He is not in acute distress.    Appearance: He  is well-developed.     Comments: NAD  Eyes:     Conjunctiva/sclera: Conjunctivae normal.     Pupils: Pupils are equal, round, and reactive to light.  Neck:     Thyroid: No thyromegaly.     Vascular: No JVD.  Cardiovascular:     Rate and Rhythm: Normal rate and regular rhythm.     Heart sounds: Normal heart sounds. No murmur heard.  No friction rub. No gallop.   Pulmonary:     Effort: Pulmonary effort is normal. No respiratory distress.     Breath sounds: Normal breath sounds. No wheezing or rales.  Chest:      Chest wall: No tenderness.  Abdominal:     General: Bowel sounds are normal. There is no distension.     Palpations: Abdomen is soft. There is no mass.     Tenderness: There is no abdominal tenderness. There is no guarding or rebound.  Musculoskeletal:        General: No tenderness. Normal range of motion.     Cervical back: Normal range of motion.  Lymphadenopathy:     Cervical: No cervical adenopathy.  Skin:    General: Skin is warm and dry.     Findings: No rash.  Neurological:     Mental Status: He is alert. He is disoriented.     Cranial Nerves: No cranial nerve deficit.     Motor: Weakness present. No abnormal muscle tone.     Coordination: Coordination abnormal.     Gait: Gait abnormal.     Deep Tendon Reflexes: Reflexes are normal and symmetric.  Psychiatric:        Behavior: Behavior normal.        Thought Content: Thought content normal.        Judgment: Judgment normal.     Lab Results  Component Value Date   WBC 4.1 10/20/2019   HGB 13.0 10/20/2019   HCT 38.2 (L) 10/20/2019   PLT 168.0 10/20/2019   GLUCOSE 125 (H) 10/20/2019   CHOL 183 08/04/2018   TRIG 143.0 08/04/2018   HDL 30.40 (L) 08/04/2018   LDLDIRECT 114.0 12/30/2016   LDLCALC 124 (H) 08/04/2018   ALT 2 07/22/2019   AST 11 07/22/2019   NA 138 10/20/2019   K 4.3 10/20/2019   CL 102 10/20/2019   CREATININE 1.74 (H) 10/20/2019   BUN 22 10/20/2019   CO2 32 10/20/2019   TSH 1.545 07/04/2019   PSA 0.31 08/04/2018   INR 1.4 (H) 07/03/2019   HGBA1C 5.5 02/13/2019   MICROALBUR 2.9 (H) 12/30/2016    CT ABDOMEN PELVIS WO CONTRAST  Result Date: 07/04/2019 CLINICAL DATA:  Constipation or obstruction. EXAM: CT ABDOMEN AND PELVIS WITHOUT CONTRAST TECHNIQUE: Multidetector CT imaging of the abdomen and pelvis was performed following the standard protocol without IV contrast. COMPARISON:  None. FINDINGS: LOWER CHEST: Bibasilar atelectasis. HEPATOBILIARY: Normal hepatic contours. No intra- or extrahepatic  biliary dilatation. Normal gallbladder. PANCREAS: Normal pancreas. No ductal dilatation or peripancreatic fluid collection. SPLEEN: Normal. ADRENALS/URINARY TRACT: The adrenal glands are normal. There is a stone within the distal left ureter, at the ureterovesical junction, measuring 2 mm, causing mild hydroureteronephrosis and mild perinephric stranding. The right kidney is normal. The urinary bladder is normal for degree of distention STOMACH/BOWEL: There is no hiatal hernia. Normal duodenal course and caliber. No small bowel dilatation or inflammation. No focal colonic abnormality. Normal appendix. VASCULAR/LYMPHATIC: There is calcific atherosclerosis of the abdominal aorta. No abdominal or pelvic lymphadenopathy. REPRODUCTIVE: Normal  prostate size with symmetric seminal vesicles. MUSCULOSKELETAL. Multilevel degenerative disc disease and facet arthrosis. No bony spinal canal stenosis. OTHER: None. IMPRESSION: 1. Left-sided obstructive uropathy with 2 mm stone at the left ureterovesical junction causing mild hydroureteronephrosis and perinephric stranding. 2.  Aortic atherosclerosis (ICD10-I70.0). Electronically Signed   By: Deatra Robinson M.D.   On: 07/04/2019 01:56   DG Abd 1 View  Result Date: 07/03/2019 CLINICAL DATA:  Constipation EXAM: ABDOMEN - 1 VIEW COMPARISON:  None. FINDINGS: Mild diffuse gaseous distention of bowel, likely ileus. No significant increase in stool burden. No organomegaly or free air. No acute bony abnormality. IMPRESSION: Diffuse gaseous distention of bowel, likely ileus. Electronically Signed   By: Charlett Nose M.D.   On: 07/03/2019 20:17   US Renal  Result Date: 07/03/2019 CLINICAL DATA:  79 year old male with acute renal insufficiency. EXAM: RENAL / URINARY TRACT ULTRASOUND COMPLETE COMPARISON:  Renal ultrasound dated 02/13/2019. FINDINGS: Right Kidney: Renal measurements: 12.6 x 5.1 x 5.1 cm = volume: 172 mL. Mild increased echogenicity. No hydronephrosis or shadowing stone.  Left Kidney: Renal measurements: 12.1 x 6.5 x 7.0 cm = volume: 287 mL. Mildly echogenic. There is mild left hydronephrosis. No shadowing stone. Bladder: The urinary bladder is partially distended and grossly unremarkable. Other: None. IMPRESSION: 1. Mild increased renal echogenicity may be related to chronic kidney disease. 2. Mild left hydronephrosis.  No shadowing stone. Electronically Signed   By: Elgie Collard M.D.   On: 07/03/2019 20:49   DG Chest Port 1 View  Result Date: 07/03/2019 CLINICAL DATA:  Weakness.  Hypertension. EXAM: PORTABLE CHEST 1 VIEW COMPARISON:  February 13, 2019 FINDINGS: There is left base scarring. Lungs elsewhere are clear. Heart is upper normal in size with pulmonary vascularity normal. No adenopathy. No bone lesions. IMPRESSION: Scarring left base, stable. Lungs elsewhere clear. Heart upper normal in size. No adenopathy. Electronically Signed   By: Bretta Bang III M.D.   On: 07/03/2019 13:52    Assessment & Plan:    Sonda Primes, MD

## 2020-01-17 NOTE — Assessment & Plan Note (Signed)
Worse Exelon On Sinemet Home PT/OT

## 2020-01-17 NOTE — Assessment & Plan Note (Signed)
BMET 

## 2020-01-17 NOTE — Assessment & Plan Note (Signed)
Worse On Sinemet Home PT/OT

## 2020-01-31 ENCOUNTER — Telehealth: Payer: Self-pay | Admitting: Internal Medicine

## 2020-01-31 NOTE — Telephone Encounter (Signed)
New message:   Lanora Manis is calling from [facility] and states they are needing updated insurance information for th pt. She states they received a referral but the current insurance provided is being flagged. Please advise.

## 2020-02-01 NOTE — Telephone Encounter (Signed)
I talked with [facility] regarding insurance

## 2020-02-14 ENCOUNTER — Telehealth: Payer: Self-pay | Admitting: Internal Medicine

## 2020-02-14 NOTE — Telephone Encounter (Signed)
   April from [facility] requesting verbal orders for medication and hypertension education 1x EOW for 6 weeks Phone [phone]

## 2020-02-15 ENCOUNTER — Telehealth: Payer: Self-pay | Admitting: Internal Medicine

## 2020-02-15 NOTE — Telephone Encounter (Signed)
   [name] from [facility] requesting verbal orders for home PT 1X week fir 9 weeks   Please call [phone]

## 2020-02-16 NOTE — Telephone Encounter (Signed)
Ok Thx 

## 2020-02-17 NOTE — Telephone Encounter (Signed)
Ok Thx 

## 2020-02-17 NOTE — Telephone Encounter (Signed)
Verbal order given per Dr Posey Rea for requested "verbal orders for medication and hypertension education 1x EOW for 6 weeks".  April confirms receipt.

## 2020-02-18 NOTE — Telephone Encounter (Signed)
[name] called back and she has been informed of below.

## 2020-04-10 ENCOUNTER — Telehealth: Payer: Self-pay | Admitting: Internal Medicine

## 2020-04-10 NOTE — Telephone Encounter (Signed)
[name] a physical therapist at [facility] calling for orders for physical therapy for 1 time a week for 1 week, 2 times a week for 5 weeks, and 1 time a week for 3 weeks. [name] # [phone] Okay to LVM

## 2020-04-10 NOTE — Telephone Encounter (Signed)
Called [name] back there was no answer LMOM w/verbal for PT.Marland KitchenRaechel Chute

## 2020-04-11 NOTE — Telephone Encounter (Signed)
Ok PT Thx

## 2020-04-13 ENCOUNTER — Encounter: Payer: Self-pay | Admitting: Internal Medicine

## 2020-04-13 ENCOUNTER — Ambulatory Visit: Payer: Medicare Other | Admitting: Internal Medicine

## 2020-04-13 ENCOUNTER — Other Ambulatory Visit: Payer: Self-pay

## 2020-04-13 VITALS — BP 106/60 | HR 60 | Temp 97.9°F | Ht 68.0 in | Wt 170.8 lb

## 2020-04-13 DIAGNOSIS — E538 Deficiency of other specified B group vitamins: Secondary | ICD-10-CM | POA: Diagnosis not present

## 2020-04-13 DIAGNOSIS — N183 Chronic kidney disease, stage 3 unspecified: Secondary | ICD-10-CM

## 2020-04-13 DIAGNOSIS — I10 Essential (primary) hypertension: Secondary | ICD-10-CM

## 2020-04-13 DIAGNOSIS — Z23 Encounter for immunization: Secondary | ICD-10-CM

## 2020-04-13 DIAGNOSIS — G20 Parkinson's disease: Secondary | ICD-10-CM

## 2020-04-13 DIAGNOSIS — F0280 Dementia in other diseases classified elsewhere without behavioral disturbance: Secondary | ICD-10-CM | POA: Diagnosis not present

## 2020-04-13 DIAGNOSIS — R413 Other amnesia: Secondary | ICD-10-CM

## 2020-04-13 DIAGNOSIS — G20A1 Parkinson's disease without dyskinesia, without mention of fluctuations: Secondary | ICD-10-CM

## 2020-04-13 LAB — CBC WITH DIFFERENTIAL/PLATELET
Basophils Absolute: 0 10*3/uL (ref 0.0–0.1)
Basophils Relative: 1.1 % (ref 0.0–3.0)
Eosinophils Absolute: 0 10*3/uL (ref 0.0–0.7)
Eosinophils Relative: 1.2 % (ref 0.0–5.0)
HCT: 35 % — ABNORMAL LOW (ref 39.0–52.0)
Hemoglobin: 12.2 g/dL — ABNORMAL LOW (ref 13.0–17.0)
Lymphocytes Relative: 46.2 % — ABNORMAL HIGH (ref 12.0–46.0)
Lymphs Abs: 1.7 10*3/uL (ref 0.7–4.0)
MCHC: 34.8 g/dL (ref 30.0–36.0)
MCV: 87.1 fl (ref 78.0–100.0)
Monocytes Absolute: 0.4 10*3/uL (ref 0.1–1.0)
Monocytes Relative: 9.8 % (ref 3.0–12.0)
Neutro Abs: 1.6 10*3/uL (ref 1.4–7.7)
Neutrophils Relative %: 41.7 % — ABNORMAL LOW (ref 43.0–77.0)
Platelets: 178 10*3/uL (ref 150.0–400.0)
RBC: 4.02 Mil/uL — ABNORMAL LOW (ref 4.22–5.81)
RDW: 12.6 % (ref 11.5–15.5)
WBC: 3.7 10*3/uL — ABNORMAL LOW (ref 4.0–10.5)

## 2020-04-13 MED ORDER — TORSEMIDE 100 MG PO TABS: 50.0000 mg | ORAL_TABLET | Freq: Every day | ORAL | 3 refills | Status: DC | PRN

## 2020-04-13 MED ORDER — DOXAZOSIN MESYLATE 4 MG PO TABS: 2.0000 mg | ORAL_TABLET | Freq: Every day | ORAL | 3 refills | Status: DC

## 2020-04-13 NOTE — Assessment & Plan Note (Signed)
try Lion's Mane Mushroom extract or capsules for memory 

## 2020-04-13 NOTE — Assessment & Plan Note (Signed)
On B12 

## 2020-04-13 NOTE — Assessment & Plan Note (Signed)
Torsemide prn 

## 2020-04-13 NOTE — Assessment & Plan Note (Addendum)
BP Readings from Last 3 Encounters:  04/13/20 106/60  01/17/20 (!) 158/76  12/21/19 130/70   Reduce Cardura to 1/2 tab a day

## 2020-04-13 NOTE — Patient Instructions (Addendum)
You can try Lion's Mane Mushroom extract or capsules 

## 2020-04-13 NOTE — Progress Notes (Signed)
Subjective:  Patient ID: [name], male    DOB: 1941/01/24  Age: 79 y.o. MRN: [id number]  CC: Follow-up   HPI BRAYLN DUQUE presents for dementia - worse, poor balance, fatigue   Outpatient Medications Prior to Visit  Medication Sig Dispense Refill  . B Complex-C (B-COMPLEX WITH VITAMIN C) tablet Take 1 tablet by mouth daily.    . carbidopa-levodopa (SINEMET IR) 25-100 MG tablet Take 1 tablet three times a day with meals 90 tablet 11  . Cholecalciferol (VITAMIN D) 1000 UNITS capsule Take 1 capsule (1,000 Units total) by mouth daily. 100 capsule 3  . cyanocobalamin (,VITAMIN B-12,) 1000 MCG/ML injection 1 ml sq for 5 days, then 1 ml weekly for 4 weeks, then 1 ml every 2 weeks (Patient taking differently: Inject 1,000 mcg into the muscle See admin instructions. 1 ml sq for 5 days, then 1 ml weekly for 4 weeks, then 1 ml every 2 weeks) 10 mL 6  . doxazosin (CARDURA) 4 MG tablet Take 1 tablet (4 mg total) by mouth daily. 90 tablet 3  . loratadine-pseudoephedrine (CLARITIN-D 24-HOUR) 10-240 MG 24 hr tablet Take 1 tablet by mouth daily as needed for allergies.    . rivastigmine (EXELON) 1.5 MG capsule Take 1 capsule (1.5 mg total) by mouth 2 (two) times daily. 60 capsule 11  . SYRINGE-NEEDLE, DISP, 3 ML (BD ECLIPSE SYRINGE) 25G X 1" 3 ML MISC Use sq as directed 50 each 3  . torsemide (DEMADEX) 100 MG tablet Take 0.5-1 tablets (50-100 mg total) by mouth daily as needed. 90 tablet 3  . traZODone (DESYREL) 50 MG tablet TAKE 2 TABLETS BY MOUTH AT BEDTIME 180 tablet 2  . valACYclovir (VALTREX) 1000 MG tablet Take 1 tablet (1,000 mg total) by mouth 3 (three) times daily. 21 tablet 0   No facility-administered medications prior to visit.    ROS: Review of Systems  Constitutional: Negative for appetite change, fatigue and unexpected weight change.  HENT: Negative for congestion, nosebleeds, sneezing, sore throat and trouble swallowing.   Eyes: Negative for itching and visual disturbance.    Respiratory: Negative for cough.   Cardiovascular: Negative for chest pain, palpitations and leg swelling.  Gastrointestinal: Negative for abdominal distention, blood in stool, diarrhea and nausea.  Genitourinary: Negative for frequency and hematuria.  Musculoskeletal: Positive for gait problem. Negative for back pain, joint swelling and neck pain.  Skin: Negative for rash.  Neurological: Negative for dizziness, tremors, speech difficulty and weakness.  Psychiatric/Behavioral: Positive for confusion. Negative for agitation, dysphoric mood and sleep disturbance. The patient is not nervous/anxious.     Objective:  BP 106/60 (BP Location: Right Arm, Patient Position: Sitting, Cuff Size: Large)   Pulse 60   Temp 97.9 F (36.6 C) (Oral)   Ht 5\' 8"  (1.727 m)   Wt 170 lb 12.8 oz (77.5 kg)   SpO2 95%   BMI 25.97 kg/m   BP Readings from Last 3 Encounters:  04/13/20 106/60  01/17/20 (!) 158/76  12/21/19 130/70    Wt Readings from Last 3 Encounters:  04/13/20 170 lb 12.8 oz (77.5 kg)  01/17/20 174 lb (78.9 kg)  12/21/19 182 lb 3.2 oz (82.6 kg)    Physical Exam Constitutional:      General: He is not in acute distress.    Appearance: He is well-developed.     Comments: NAD  Eyes:     Conjunctiva/sclera: Conjunctivae normal.     Pupils: Pupils are equal, round, and reactive to  light.  Neck:     Thyroid: No thyromegaly.     Vascular: No JVD.  Cardiovascular:     Rate and Rhythm: Normal rate and regular rhythm.     Heart sounds: Normal heart sounds. No murmur heard.  No friction rub. No gallop.   Pulmonary:     Effort: Pulmonary effort is normal. No respiratory distress.     Breath sounds: Normal breath sounds. No wheezing or rales.  Chest:     Chest wall: No tenderness.  Abdominal:     General: Bowel sounds are normal. There is no distension.     Palpations: Abdomen is soft. There is no mass.     Tenderness: There is no abdominal tenderness. There is no guarding or  rebound.  Musculoskeletal:        General: No tenderness. Normal range of motion.     Cervical back: Normal range of motion.  Lymphadenopathy:     Cervical: No cervical adenopathy.  Skin:    General: Skin is warm and dry.     Findings: No rash.  Neurological:     Mental Status: He is alert and oriented to person, place, and time.     Cranial Nerves: No cranial nerve deficit.     Motor: No abnormal muscle tone.     Coordination: Coordination normal.     Gait: Gait abnormal.     Deep Tendon Reflexes: Reflexes are normal and symmetric.  Psychiatric:        Behavior: Behavior normal.        Thought Content: Thought content normal.        Judgment: Judgment normal.   walker  Lab Results  Component Value Date   WBC 4.1 10/20/2019   HGB 13.0 10/20/2019   HCT 38.2 (L) 10/20/2019   PLT 168.0 10/20/2019   GLUCOSE 97 01/17/2020   CHOL 183 08/04/2018   TRIG 143.0 08/04/2018   HDL 30.40 (L) 08/04/2018   LDLDIRECT 114.0 12/30/2016   LDLCALC 124 (H) 08/04/2018   ALT 7 (L) 01/17/2020   AST 11 01/17/2020   NA 140 01/17/2020   K 3.8 01/17/2020   CL 102 01/17/2020   CREATININE 1.55 (H) 01/17/2020   BUN 21 01/17/2020   CO2 28 01/17/2020   TSH 1.545 07/04/2019   PSA 0.31 08/04/2018   INR 1.4 (H) 07/03/2019   HGBA1C 5.5 02/13/2019   MICROALBUR 2.9 (H) 12/30/2016    CT ABDOMEN PELVIS WO CONTRAST  Result Date: 07/04/2019 CLINICAL DATA:  Constipation or obstruction. EXAM: CT ABDOMEN AND PELVIS WITHOUT CONTRAST TECHNIQUE: Multidetector CT imaging of the abdomen and pelvis was performed following the standard protocol without IV contrast. COMPARISON:  None. FINDINGS: LOWER CHEST: Bibasilar atelectasis. HEPATOBILIARY: Normal hepatic contours. No intra- or extrahepatic biliary dilatation. Normal gallbladder. PANCREAS: Normal pancreas. No ductal dilatation or peripancreatic fluid collection. SPLEEN: Normal. ADRENALS/URINARY TRACT: The adrenal glands are normal. There is a stone within the  distal left ureter, at the ureterovesical junction, measuring 2 mm, causing mild hydroureteronephrosis and mild perinephric stranding. The right kidney is normal. The urinary bladder is normal for degree of distention STOMACH/BOWEL: There is no hiatal hernia. Normal duodenal course and caliber. No small bowel dilatation or inflammation. No focal colonic abnormality. Normal appendix. VASCULAR/LYMPHATIC: There is calcific atherosclerosis of the abdominal aorta. No abdominal or pelvic lymphadenopathy. REPRODUCTIVE: Normal prostate size with symmetric seminal vesicles. MUSCULOSKELETAL. Multilevel degenerative disc disease and facet arthrosis. No bony spinal canal stenosis. OTHER: None. IMPRESSION: 1. Left-sided obstructive uropathy with  2 mm stone at the left ureterovesical junction causing mild hydroureteronephrosis and perinephric stranding. 2.  Aortic atherosclerosis (ICD10-I70.0). Electronically Signed   By: Deatra Robinson M.D.   On: 07/04/2019 01:56   DG Abd 1 View  Result Date: 07/03/2019 CLINICAL DATA:  Constipation EXAM: ABDOMEN - 1 VIEW COMPARISON:  None. FINDINGS: Mild diffuse gaseous distention of bowel, likely ileus. No significant increase in stool burden. No organomegaly or free air. No acute bony abnormality. IMPRESSION: Diffuse gaseous distention of bowel, likely ileus. Electronically Signed   By: Charlett Nose M.D.   On: 07/03/2019 20:17   US Renal  Result Date: 07/03/2019 CLINICAL DATA:  79 year old male with acute renal insufficiency. EXAM: RENAL / URINARY TRACT ULTRASOUND COMPLETE COMPARISON:  Renal ultrasound dated 02/13/2019. FINDINGS: Right Kidney: Renal measurements: 12.6 x 5.1 x 5.1 cm = volume: 172 mL. Mild increased echogenicity. No hydronephrosis or shadowing stone. Left Kidney: Renal measurements: 12.1 x 6.5 x 7.0 cm = volume: 287 mL. Mildly echogenic. There is mild left hydronephrosis. No shadowing stone. Bladder: The urinary bladder is partially distended and grossly unremarkable.  Other: None. IMPRESSION: 1. Mild increased renal echogenicity may be related to chronic kidney disease. 2. Mild left hydronephrosis.  No shadowing stone. Electronically Signed   By: Elgie Collard M.D.   On: 07/03/2019 20:49   DG Chest Port 1 View  Result Date: 07/03/2019 CLINICAL DATA:  Weakness.  Hypertension. EXAM: PORTABLE CHEST 1 VIEW COMPARISON:  February 13, 2019 FINDINGS: There is left base scarring. Lungs elsewhere are clear. Heart is upper normal in size with pulmonary vascularity normal. No adenopathy. No bone lesions. IMPRESSION: Scarring left base, stable. Lungs elsewhere clear. Heart upper normal in size. No adenopathy. Electronically Signed   By: Bretta Bang III M.D.   On: 07/03/2019 13:52    Assessment & Plan:   There are no diagnoses linked to this encounter.   No orders of the defined types were placed in this encounter.    Follow-up: No follow-ups on file.  Sonda Primes, MD

## 2020-04-13 NOTE — Assessment & Plan Note (Signed)
Sinemet po

## 2020-04-13 NOTE — Assessment & Plan Note (Signed)
try Lion's Mane Mushroom extract or capsules for memory Cont Exelon

## 2020-04-14 LAB — COMPREHENSIVE METABOLIC PANEL
ALT: 2 U/L (ref 0–53)
AST: 8 U/L (ref 0–37)
Albumin: 3.7 g/dL (ref 3.5–5.2)
Alkaline Phosphatase: 62 U/L (ref 39–117)
BUN: 27 mg/dL — ABNORMAL HIGH (ref 6–23)
CO2: 33 mEq/L — ABNORMAL HIGH (ref 19–32)
Calcium: 9 mg/dL (ref 8.4–10.5)
Chloride: 102 mEq/L (ref 96–112)
Creatinine, Ser: 2.22 mg/dL — ABNORMAL HIGH (ref 0.40–1.50)
GFR: 29 mL/min — ABNORMAL LOW (ref >60.00)
Glucose, Bld: 95 mg/dL (ref 70–99)
Potassium: 3.9 mEq/L (ref 3.5–5.1)
Sodium: 141 mEq/L (ref 135–145)
Total Bilirubin: 1.3 mg/dL — ABNORMAL HIGH (ref 0.2–1.2)
Total Protein: 5.7 g/dL — ABNORMAL LOW (ref 6.0–8.3)

## 2020-05-03 ENCOUNTER — Observation Stay (HOSPITAL_COMMUNITY): Payer: Medicare Other

## 2020-05-03 ENCOUNTER — Emergency Department (HOSPITAL_COMMUNITY): Payer: Medicare Other

## 2020-05-03 ENCOUNTER — Encounter (HOSPITAL_COMMUNITY): Payer: Self-pay

## 2020-05-03 ENCOUNTER — Inpatient Hospital Stay (HOSPITAL_COMMUNITY)
Admission: EM | Admit: 2020-05-03 | Discharge: 2020-05-09 | DRG: 177 | Disposition: A | Discharge status: Skilled Nursing Facility | Payer: Medicare Other | Attending: Internal Medicine | Admitting: Internal Medicine

## 2020-05-03 DIAGNOSIS — Z87891 Personal history of nicotine dependence: Secondary | ICD-10-CM

## 2020-05-03 DIAGNOSIS — E876 Hypokalemia: Secondary | ICD-10-CM | POA: Diagnosis not present

## 2020-05-03 DIAGNOSIS — Z888 Allergy status to other drugs, medicaments and biological substances status: Secondary | ICD-10-CM

## 2020-05-03 DIAGNOSIS — D708 Other neutropenia: Secondary | ICD-10-CM

## 2020-05-03 DIAGNOSIS — J302 Other seasonal allergic rhinitis: Secondary | ICD-10-CM | POA: Diagnosis present

## 2020-05-03 DIAGNOSIS — D696 Thrombocytopenia, unspecified: Secondary | ICD-10-CM

## 2020-05-03 DIAGNOSIS — I951 Orthostatic hypotension: Secondary | ICD-10-CM | POA: Diagnosis present

## 2020-05-03 DIAGNOSIS — Z87440 Personal history of urinary (tract) infections: Secondary | ICD-10-CM

## 2020-05-03 DIAGNOSIS — R296 Repeated falls: Secondary | ICD-10-CM | POA: Diagnosis not present

## 2020-05-03 DIAGNOSIS — N400 Enlarged prostate without lower urinary tract symptoms: Secondary | ICD-10-CM | POA: Diagnosis present

## 2020-05-03 DIAGNOSIS — Z66 Do not resuscitate: Secondary | ICD-10-CM | POA: Diagnosis present

## 2020-05-03 DIAGNOSIS — E785 Hyperlipidemia, unspecified: Secondary | ICD-10-CM | POA: Diagnosis present

## 2020-05-03 DIAGNOSIS — F0280 Dementia in other diseases classified elsewhere without behavioral disturbance: Secondary | ICD-10-CM | POA: Diagnosis present

## 2020-05-03 DIAGNOSIS — D631 Anemia in chronic kidney disease: Secondary | ICD-10-CM | POA: Diagnosis present

## 2020-05-03 DIAGNOSIS — I129 Hypertensive chronic kidney disease with stage 1 through stage 4 chronic kidney disease, or unspecified chronic kidney disease: Secondary | ICD-10-CM | POA: Diagnosis present

## 2020-05-03 DIAGNOSIS — E538 Deficiency of other specified B group vitamins: Secondary | ICD-10-CM | POA: Diagnosis present

## 2020-05-03 DIAGNOSIS — E860 Dehydration: Secondary | ICD-10-CM | POA: Diagnosis present

## 2020-05-03 DIAGNOSIS — Z882 Allergy status to sulfonamides status: Secondary | ICD-10-CM

## 2020-05-03 DIAGNOSIS — J449 Chronic obstructive pulmonary disease, unspecified: Secondary | ICD-10-CM | POA: Diagnosis present

## 2020-05-03 DIAGNOSIS — Z9181 History of falling: Secondary | ICD-10-CM

## 2020-05-03 DIAGNOSIS — Z880 Allergy status to penicillin: Secondary | ICD-10-CM

## 2020-05-03 DIAGNOSIS — R55 Syncope and collapse: Secondary | ICD-10-CM

## 2020-05-03 DIAGNOSIS — D61818 Other pancytopenia: Secondary | ICD-10-CM | POA: Diagnosis present

## 2020-05-03 DIAGNOSIS — Z8673 Personal history of transient ischemic attack (TIA), and cerebral infarction without residual deficits: Secondary | ICD-10-CM

## 2020-05-03 DIAGNOSIS — N1831 Chronic kidney disease, stage 3a: Secondary | ICD-10-CM | POA: Diagnosis present

## 2020-05-03 DIAGNOSIS — G9341 Metabolic encephalopathy: Secondary | ICD-10-CM | POA: Diagnosis present

## 2020-05-03 DIAGNOSIS — R4182 Altered mental status, unspecified: Secondary | ICD-10-CM | POA: Diagnosis present

## 2020-05-03 DIAGNOSIS — U071 COVID-19: Secondary | ICD-10-CM | POA: Diagnosis not present

## 2020-05-03 DIAGNOSIS — G4750 Parasomnia, unspecified: Secondary | ICD-10-CM | POA: Diagnosis present

## 2020-05-03 DIAGNOSIS — G20 Parkinson's disease: Secondary | ICD-10-CM | POA: Diagnosis present

## 2020-05-03 DIAGNOSIS — Z79899 Other long term (current) drug therapy: Secondary | ICD-10-CM

## 2020-05-03 DIAGNOSIS — Z23 Encounter for immunization: Secondary | ICD-10-CM

## 2020-05-03 DIAGNOSIS — D709 Neutropenia, unspecified: Secondary | ICD-10-CM

## 2020-05-03 LAB — CBC WITH DIFFERENTIAL/PLATELET
Abs Immature Granulocytes: 0.01 10*3/uL (ref 0.00–0.07)
Basophils Absolute: 0 10*3/uL (ref 0.0–0.1)
Basophils Relative: 1 %
Eosinophils Absolute: 0 10*3/uL (ref 0.0–0.5)
Eosinophils Relative: 1 %
HCT: 31.3 % — ABNORMAL LOW (ref 39.0–52.0)
Hemoglobin: 10.8 g/dL — ABNORMAL LOW (ref 13.0–17.0)
Immature Granulocytes: 1 %
Lymphocytes Relative: 27 %
Lymphs Abs: 0.5 10*3/uL — ABNORMAL LOW (ref 0.7–4.0)
MCH: 30.6 pg (ref 26.0–34.0)
MCHC: 34.5 g/dL (ref 30.0–36.0)
MCV: 88.7 fL (ref 80.0–100.0)
Monocytes Absolute: 0.3 10*3/uL (ref 0.1–1.0)
Monocytes Relative: 16 %
Neutro Abs: 1.1 10*3/uL — ABNORMAL LOW (ref 1.7–7.7)
Neutrophils Relative %: 54 %
Platelets: 105 10*3/uL — ABNORMAL LOW (ref 150–400)
RBC: 3.53 MIL/uL — ABNORMAL LOW (ref 4.22–5.81)
RDW: 11.8 % (ref 11.5–15.5)
WBC: 1.9 10*3/uL — ABNORMAL LOW (ref 4.0–10.5)
nRBC: 0 % (ref 0.0–0.2)

## 2020-05-03 LAB — BASIC METABOLIC PANEL
Anion gap: 7 (ref 5–15)
BUN: 20 mg/dL (ref 8–23)
CO2: 24 mmol/L (ref 22–32)
Calcium: 6.8 mg/dL — ABNORMAL LOW (ref 8.9–10.3)
Chloride: 110 mmol/L (ref 98–111)
Creatinine, Ser: 1.48 mg/dL — ABNORMAL HIGH (ref 0.61–1.24)
GFR, Estimated: 48 mL/min — ABNORMAL LOW (ref >60)
Glucose, Bld: 108 mg/dL — ABNORMAL HIGH (ref 70–99)
Potassium: 3.3 mmol/L — ABNORMAL LOW (ref 3.5–5.1)
Sodium: 141 mmol/L (ref 135–145)

## 2020-05-03 LAB — URINALYSIS, ROUTINE W REFLEX MICROSCOPIC
Bacteria, UA: NONE SEEN
Bilirubin Urine: NEGATIVE
Glucose, UA: NEGATIVE mg/dL
Ketones, ur: NEGATIVE mg/dL
Leukocytes,Ua: NEGATIVE
Nitrite: NEGATIVE
Protein, ur: NEGATIVE mg/dL
Specific Gravity, Urine: 1.005 (ref 1.005–1.030)
pH: 7 (ref 5.0–8.0)

## 2020-05-03 LAB — COMPREHENSIVE METABOLIC PANEL
ALT: <5 U/L (ref 0–44)
AST: 13 U/L — ABNORMAL LOW (ref 15–41)
Albumin: 3.5 g/dL (ref 3.5–5.0)
Alkaline Phosphatase: 66 U/L (ref 38–126)
Anion gap: 7 (ref 5–15)
BUN: 21 mg/dL (ref 8–23)
CO2: 27 mmol/L (ref 22–32)
Calcium: 8.2 mg/dL — ABNORMAL LOW (ref 8.9–10.3)
Chloride: 105 mmol/L (ref 98–111)
Creatinine, Ser: 1.71 mg/dL — ABNORMAL HIGH (ref 0.61–1.24)
GFR, Estimated: 40 mL/min — ABNORMAL LOW (ref >60)
Glucose, Bld: 112 mg/dL — ABNORMAL HIGH (ref 70–99)
Potassium: 3.7 mmol/L (ref 3.5–5.1)
Sodium: 139 mmol/L (ref 135–145)
Total Bilirubin: 1 mg/dL (ref 0.3–1.2)
Total Protein: 6.1 g/dL — ABNORMAL LOW (ref 6.5–8.1)

## 2020-05-03 LAB — HEPATIC FUNCTION PANEL
ALT: <5 U/L (ref 0–44)
AST: 12 U/L — ABNORMAL LOW (ref 15–41)
Albumin: 2.7 g/dL — ABNORMAL LOW (ref 3.5–5.0)
Alkaline Phosphatase: 52 U/L (ref 38–126)
Bilirubin, Direct: 0.2 mg/dL (ref 0.0–0.2)
Indirect Bilirubin: 0.7 mg/dL (ref 0.3–0.9)
Total Bilirubin: 0.9 mg/dL (ref 0.3–1.2)
Total Protein: 4.8 g/dL — ABNORMAL LOW (ref 6.5–8.1)

## 2020-05-03 LAB — FIBRINOGEN: Fibrinogen: 273 mg/dL (ref 210–475)

## 2020-05-03 LAB — LACTIC ACID, PLASMA: Lactic Acid, Venous: 1.1 mmol/L (ref 0.5–1.9)

## 2020-05-03 LAB — TRIGLYCERIDES: Triglycerides: 62 mg/dL (ref <150)

## 2020-05-03 LAB — D-DIMER, QUANTITATIVE: D-Dimer, Quant: 0.96 ug/mL-FEU — ABNORMAL HIGH (ref 0.00–0.50)

## 2020-05-03 LAB — PROCALCITONIN: Procalcitonin: <0.1 ng/mL

## 2020-05-03 LAB — LACTATE DEHYDROGENASE: LDH: 149 U/L (ref 98–192)

## 2020-05-03 LAB — RESPIRATORY PANEL BY RT PCR (FLU A&B, COVID)
Influenza A by PCR: NEGATIVE
Influenza B by PCR: NEGATIVE
SARS Coronavirus 2 by RT PCR: POSITIVE — AB

## 2020-05-03 LAB — FERRITIN: Ferritin: 305 ng/mL (ref 24–336)

## 2020-05-03 LAB — C-REACTIVE PROTEIN: CRP: 0.6 mg/dL (ref <1.0)

## 2020-05-03 MED ORDER — SODIUM CHLORIDE 0.9 % IV SOLN: INTRAVENOUS | Status: DC

## 2020-05-03 MED ORDER — DOXAZOSIN MESYLATE 2 MG PO TABS
2.0000 mg | ORAL_TABLET | Freq: Every day | ORAL | Status: DC
  Administered 2020-05-04 – 2020-05-05 (×2): 2 mg via ORAL
  Filled 2020-05-03 (×2): qty 1

## 2020-05-03 MED ORDER — LORAZEPAM 2 MG/ML IJ SOLN
0.5000 mg | Freq: Once | INTRAMUSCULAR | Status: AC
  Administered 2020-05-03: 0.5 mg via INTRAVENOUS
  Filled 2020-05-03: qty 1

## 2020-05-03 MED ORDER — SODIUM CHLORIDE 0.9 % IV BOLUS
1000.0000 mL | Freq: Once | INTRAVENOUS | Status: AC
  Administered 2020-05-03: 1000 mL via INTRAVENOUS

## 2020-05-03 NOTE — ED Triage Notes (Addendum)
Pt was found unresponsive on the couch after breakfast this morning. Upon EMS arrival pt AOx4. Family stated that this happened last tim ept had a UTI. Pt currently aox4

## 2020-05-03 NOTE — ED Notes (Signed)
Report called to RN Hannah.

## 2020-05-03 NOTE — ED Provider Notes (Signed)
[facility]-EMERGENCY DEPT Provider Note  [id number] Arrival date & time: 05/03/20  1234     History Chief Complaint  Patient presents with  . Altered Mental Status    [name] is a 79 y.o. male.  Patient arrives after syncopal episode while sitting on a couch.  Lasted maybe several seconds.  Had a similar episode several days ago as well.  No seizure-like activity.  Does have a history of Parkinson's but has been more lethargic and now has had 2 syncopal episodes unprovoked sounds like according to family member.  Has had urinary tract infections in the past as well.  The history is provided by the patient and a relative.  Loss of Consciousness Episode history:  Multiple Most recent episode:  Today Progression:  Unchanged Chronicity:  New Context: sitting down   Witnessed: yes   Relieved by:  Nothing Worsened by:  Nothing Associated symptoms: no chest pain, no fever, no palpitations, no seizures, no shortness of breath and no vomiting   Risk factors: no seizures   Risk factors comment:  Parkinson's disease      Past Medical History:  Diagnosis Date  . B12 deficiency 11/19/2018  . Benign prostatic hypertrophy   . Edema   . Hyperlipidemia   . Hypertension   . Insomnia   . Parasomnia 10/14/2011   PSG 12/15/11>>AHI 0.4, SpO2 low 89%, PLMI 16, somnoliloquay, nocturia   . Prediabetes 06/10/2012  . Seasonal allergies   . Sepsis (HCC) 01/2019  . UTI (lower urinary tract infection)     Patient Active Problem List   Diagnosis Date Noted  . Shingles 11/17/2019  . Hydronephrosis with renal and ureteral calculus obstruction 07/06/2019  . Constipation 07/03/2019  . Acute metabolic encephalopathy 07/03/2019  . CRF (chronic renal failure), stage 3 (moderate) 03/18/2019  . Pressure injury of skin 02/14/2019  . ARF (acute renal failure) (HCC) 02/13/2019  . Sepsis (HCC) 02/13/2019  . Protein-calorie malnutrition, severe (HCC) 02/13/2019  .  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 Parkinson's syndrome (HCC) 02/13/2019  . Infected sebaceous cyst 12/04/2018  . Hallucinations 11/19/2018  . Shoulder pain 11/19/2018  . Vitamin B12 deficiency 11/19/2018  . 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 Memory loss 09/21/2018  . Falls frequently 09/21/2018  . Fatigue 10/27/2017  . Edema 07/29/2017  . Chronic venous insufficiency 06/30/2017  . Cellulitis and abscess of leg, except foot 06/30/2017  . Ear pain, referred, right 06/10/2016  . Urinary frequency 04/16/2016  . Cerumen impaction 08/22/2015  . Infection of right hand due to bite 04/20/2014  . Hemifacial spasm 09/15/2013  . Well adult exam 07/16/2013  . Dermatochalasis of eyelid 01/01/2012  . Other facial nerve disorders 01/01/2012  . 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 Twitching 11/04/2011  . Non-REM Parasomnia 10/14/2011  . Dog bite of left hand 08/19/2011  . Wound infection 08/19/2011  . Hypokalemia 12/10/2010  . Hyperglycemia 12/10/2010  . Dyslipidemia   . Benign prostatic hyperplasia   . LEG CRAMPS 03/07/2009  . OTITIS EXTERNA 08/30/2008  . Prostatitis 08/30/2008  . Essential hypertension 08/26/2007  . ERECTILE DYSFUNCTION 08/26/2007    Past Surgical History:  Procedure Laterality Date  . CYSTOSCOPY/URETEROSCOPY/HOLMIUM LASER/STENT PLACEMENT Left 07/05/2019   Procedure: CYSTOSCOPY/URETEROSCOPY/STENT PLACEMENT/REROGRADE;  Surgeon: Crista Elliot, MD;  Location: [facility];  Service: Urology;  Laterality: Left;  . FINGER SURGERY Right 04/19/2014   RIGHT INDEX & RING FINGER   FROM DOG BITE   .  I & D EXTREMITY Right 04/20/2014   Procedure: IRRIGATION AND DEBRIDEMENT right index finger and right ring finger;  Surgeon: Dominica Severin, MD;  Location: [facility] OR;  Service: Orthopedics;  Laterality: Right;  . I & D EXTREMITY Right 04/21/2014   Procedure: IRRIGATION AND DEBRIDEMENT RIGHT HAND RING FINGER, INDEX FINGER AND Flexor  Tenolysis and FDS Tenotomy;  Surgeon: Dominica Severin, MD;  Location: [facility] OR;  Service: Orthopedics;  Laterality: Right;       Family History  Problem Relation Age of Onset  . Diabetes Mother   . Tremor Father   . Diabetes Other     Social History   Tobacco Use  . Smoking status: Former Smoker    Packs/day: 1.00    Years: 7.00    Pack years: 7.00    Types: Cigarettes, Cigars  . Smokeless tobacco: Never Used  . Tobacco comment: cigars day/ 1-2       QIUIT SMOKING IN 2000  Vaping Use  . Vaping Use: Never used  Substance Use Topics  . Alcohol use: Not Currently    Comment: OCCASSIONAL WINE -1 glass/month  . Drug use: No    Home Medications Prior to Admission medications   Medication Sig Start Date End Date Taking? Authorizing Provider  B Complex-C (B-COMPLEX WITH VITAMIN C) tablet Take 1 tablet by mouth daily.    [provider]  carbidopa-levodopa (SINEMET IR) 25-100 MG tablet Take 1 tablet three times a day with meals 10/19/19   Van Clines, MD  Cholecalciferol (VITAMIN D) 1000 UNITS capsule Take 1 capsule (1,000 Units total) by mouth daily. 09/09/12   Plotnikov, Georgina Quint, MD  cyanocobalamin (,VITAMIN B-12,) 1000 MCG/ML injection 1 ml sq for 5 days, then 1 ml weekly for 4 weeks, then 1 ml every 2 weeks Patient taking differently: Inject 1,000 mcg into the muscle See admin instructions. 1 ml sq for 5 days, then 1 ml weekly for 4 weeks, then 1 ml every 2 weeks 11/19/18   Plotnikov, Georgina Quint, MD  doxazosin (CARDURA) 4 MG tablet Take 0.5 tablets (2 mg total) by mouth daily. 04/13/20   Plotnikov, Georgina Quint, MD  loratadine-pseudoephedrine (CLARITIN-D 24-HOUR) 10-240 MG 24 hr tablet Take 1 tablet by mouth daily as needed for allergies.    [provider]  rivastigmine (EXELON) 1.5 MG capsule Take 1 capsule (1.5 mg total) by mouth 2 (two) times daily. 10/19/19   Van Clines, MD  SYRINGE-NEEDLE, DISP, 3 ML (BD ECLIPSE SYRINGE) 25G X 1" 3 ML MISC Use sq as directed  11/19/18   Plotnikov, Georgina Quint, MD  torsemide (DEMADEX) 100 MG tablet Take 0.5-1 tablets (50-100 mg total) by mouth daily as needed. 04/13/20   Plotnikov, Georgina Quint, MD  traZODone (DESYREL) 50 MG tablet TAKE 2 TABLETS BY MOUTH AT BEDTIME 10/19/19   Van Clines, MD  valACYclovir (VALTREX) 1000 MG tablet Take 1 tablet (1,000 mg total) by mouth 3 (three) times daily. 11/17/19   Plotnikov, Georgina Quint, MD    Allergies    Amlodipine, Atorvastatin, Penicillins, Sulfonamide derivatives, Telmisartan, and Sulfa antibiotics  Review of Systems   Review of Systems  Constitutional: Negative for chills and fever.  HENT: Negative for ear pain and sore throat.   Eyes: Negative for pain and visual disturbance.  Respiratory: Negative for cough and shortness of breath.   Cardiovascular: Positive for syncope. Negative for chest pain and palpitations.  Gastrointestinal: Negative for abdominal pain and vomiting.  Genitourinary: Negative for dysuria and hematuria.  Musculoskeletal: Negative for arthralgias and back pain.  Skin: Negative for color change and rash.  Neurological: Positive for syncope. Negative for seizures.  All other systems reviewed and are negative.   Physical Exam Updated Vital Signs  ED Triage Vitals  Enc Vitals Group     BP 05/03/20 1248 (!) 180/77     Pulse Rate 05/03/20 1248 (!) 58     Resp 05/03/20 1248 17     Temp 05/03/20 1248 (!) 97.4 F (36.3 C)     Temp Source 05/03/20 1248 Oral     SpO2 05/03/20 1248 95 %     Weight --      Height --      Head Circumference --      Peak Flow --      Pain Score 05/03/20 1252 0     Pain Loc --      Pain Edu? --      Excl. in GC? --     Physical Exam Vitals and nursing note reviewed.  Constitutional:      General: He is not in acute distress.    Appearance: He is well-developed. He is not ill-appearing.  HENT:     Head: Normocephalic and atraumatic.     Nose: Nose normal.     Mouth/Throat:     Mouth: Mucous membranes are  moist.  Eyes:     Extraocular Movements: Extraocular movements intact.     Conjunctiva/sclera: Conjunctivae normal.     Pupils: Pupils are equal, round, and reactive to light.  Cardiovascular:     Rate and Rhythm: Normal rate and regular rhythm.     Pulses: Normal pulses.     Heart sounds: Normal heart sounds. No murmur heard.   Pulmonary:     Effort: Pulmonary effort is normal. No respiratory distress.     Breath sounds: Normal breath sounds.  Abdominal:     Palpations: Abdomen is soft.     Tenderness: There is no abdominal tenderness.  Musculoskeletal:        General: Normal range of motion.     Cervical back: Normal range of motion and neck supple.  Skin:    General: Skin is warm and dry.     Capillary Refill: Capillary refill takes less than 2 seconds.  Neurological:     General: No focal deficit present.     Mental Status: He is alert and oriented to person, place, and time.     Cranial Nerves: No cranial nerve deficit.     Sensory: No sensory deficit.     Motor: No weakness.     Coordination: Coordination normal.     Comments: 5+/5 strength throughout, normal sensation, no drift     ED Results / Procedures / Treatments   Labs (all labs ordered are listed, but only abnormal results are displayed) Labs Reviewed  RESPIRATORY PANEL BY RT PCR (FLU A&B, COVID) - Abnormal; Notable for the following components:      Result Value   SARS Coronavirus 2 by RT PCR POSITIVE (*)    All other components within normal limits  CBC WITH DIFFERENTIAL/PLATELET - Abnormal; Notable for the following components:   WBC 1.9 (*)    RBC 3.53 (*)    Hemoglobin 10.8 (*)    HCT 31.3 (*)    Platelets 105 (*)    Neutro Abs 1.1 (*)    Lymphs Abs 0.5 (*)    All other components within normal limits  BASIC METABOLIC PANEL -  Abnormal; Notable for the following components:   Potassium 3.3 (*)    Glucose, Bld 108 (*)    Creatinine, Ser 1.48 (*)    Calcium 6.8 (*)    GFR, Estimated 48 (*)     All other components within normal limits  URINALYSIS, ROUTINE W REFLEX MICROSCOPIC - Abnormal; Notable for the following components:   Color, Urine STRAW (*)    Hgb urine dipstick SMALL (*)    All other components within normal limits  HEPATIC FUNCTION PANEL - Abnormal; Notable for the following components:   Total Protein 4.8 (*)    Albumin 2.7 (*)    AST 12 (*)    All other components within normal limits  CULTURE, BLOOD (SINGLE)  LACTIC ACID, PLASMA  LACTIC ACID, PLASMA  COMPREHENSIVE METABOLIC PANEL  D-DIMER, QUANTITATIVE (NOT AT [facility])  PROCALCITONIN  LACTATE DEHYDROGENASE  FERRITIN  TRIGLYCERIDES  FIBRINOGEN  C-REACTIVE PROTEIN    EKG EKG Interpretation  Date/Time:  Wednesday May 03 2020 13:17:03 EST Ventricular Rate:  63 PR Interval:  154 QRS Duration: 82 QT Interval:  418 QTC Calculation: 427 R Axis:   19 Text Interpretation: Normal sinus rhythm Normal ECG Confirmed by Virgina Norfolk [phone]) on 05/03/2020 1:31:09 PM   Radiology DG Chest 2 View  Result Date: 05/03/2020 CLINICAL DATA:  79 year old male with a history of syncope EXAM: CHEST - 2 VIEW COMPARISON:  07/03/2019 FINDINGS: Cardiomediastinal silhouette likely unchanged in size and contour given the positioning. No pneumothorax. No pleural effusion. Coarsened interstitial markings bilateral lungs without confluent airspace disease. No acute displaced fracture. Degenerative changes of the visualized spine. IMPRESSION: Negative for acute cardiopulmonary disease Electronically Signed   By: Gilmer Mor D.O.   On: 05/03/2020 13:50    Procedures Procedures (including critical care time)  Medications Ordered in ED Medications  sodium chloride 0.9 % bolus 1,000 mL (0 mLs Intravenous Stopped 05/03/20 1446)    ED Course  I have reviewed the triage vital signs and the nursing notes.  Pertinent labs & imaging results that were available during my care of the patient were reviewed by me and considered in  my medical decision making (see chart for details).    MDM Rules/Calculators/A&P                          [name] is an 79 year old male with history of Parkinson's and some mild dementia who presents to the ED after syncopal event.  Overall unremarkable vitals.  Has had 2 episodes of what sounds like unprovoked syncope events.  Today he was found unresponsive on the couch.  Did not hit his head.  No seizure-like activities.  Daughter states that another episode a couple days ago that his wife witnessed.  Overall will get work-up with basic labs, CT head, urine studies but anticipate admission given likely concern for cardiac arrhythmia and need for prolonged telemetry and echocardiogram.  Does not appear to have had a stroke given fairly unremarkable exam.  We will get a head CT as discussed earlier.  We will also do Covid screening.  Could be medication related as well.  Patient with positive Covid test and also has leukopenia and some mild neutropenia but overall some suppression of all cell lines.  This could be in the setting of Covid.  Does not have fever.  Patient has negative urinalysis.  EKG does not show any heart block.  Chest x-ray without any signs of inflammation or infection.  Overall Covid could be the cause of syncopal episodes but also concerned about possible arrhythmic process and will admit for further observation.  Talked on the phone with hospitalist and will obtain head CT, inflammatory markers for Covid to complete work-up.  He appears neurologically intact.  To be admitted for further care.  This chart was dictated using voice recognition software.  Despite best efforts to proofread,  errors can occur which can change the documentation meaning.    Final Clinical Impression(s) / ED Diagnoses Final diagnoses:  COVID-19  Syncope and collapse    Rx / DC Orders ED Discharge Orders    None       Virgina NorfolkCuratolo, Keniya Schlotterbeck, DO 05/03/20 1602

## 2020-05-03 NOTE — H&P (Signed)
History and Physical    Larry BackWoody M [name] ZOX:[id number] DOB: 07/01/1940 DOA: 05/03/2020  PCP: Tresa GarterPlotnikov, Aleksei V, MD Patient coming from:   I have personally briefly reviewed patient's old medical [facility]  Chief Complaint: lOCx2  HPI: [name] is a 79 y.o. male with medical history significant of Parkinson's, Dementia, lives at home with wife, history of frequent falls, history of BPH, is brought by EMS to the ED due to found unresponsive x2 at home.  First episode on Saturday she had transient loss of consciousness while sitting on the couch, back to baseline quickly, but has been having some lethargy for the last 2 days, today had another brief episode of loss of consciousness unresponsive on the couch after breakfast, on arrival to the ED patient is alert and well oriented.  No nausea no vomiting, no pain, no fever, no dysuria.  ED course; Blood pressure (!) 169/91, pulse 63, temperature (!) 97.4 F (36.3 C), temperature source Oral, resp. rate 11, SpO2 99 %. Basic labs showed neutropenia, neutrophil count 1100 cells per microliter, thrombocytopenia, platelets 105, anemia hemoglobin 10.8, hypokalemia potassium 3.3, LFT unremarkable, creatinine 1.48 appears at baseline, clear urine, UA did not show any bacteria.  EKG, personally reviewed, sinus rhythm heart rate 63, no acute ST-T changes, QTC unremarkable.  Chest x-ray, personally reviewed no acute infiltrate CT head ordered, not done yet  Respiratory PCR screening positive for SARS Covid 2, per family patient is vaccinated in April, has not received booster shots yet.. There is no hypoxia. I requested EDP to add on blood culture and covid labs.   Review of Systems: As per HPI otherwise all other systems reviewed and are negative.   Past Medical History:  Diagnosis Date  . B12 deficiency 11/19/2018  . Benign prostatic hypertrophy   . Edema   . Hyperlipidemia   . Hypertension   . Insomnia   . Parasomnia  10/14/2011   PSG 12/15/11>>AHI 0.4, SpO2 low 89%, PLMI 16, somnoliloquay, nocturia   . Prediabetes 06/10/2012  . Seasonal allergies   . Sepsis (HCC) 01/2019  . UTI (lower urinary tract infection)     Past Surgical History:  Procedure Laterality Date  . CYSTOSCOPY/URETEROSCOPY/HOLMIUM LASER/STENT PLACEMENT Left 07/05/2019   Procedure: CYSTOSCOPY/URETEROSCOPY/STENT PLACEMENT/REROGRADE;  Surgeon: Crista ElliotBell, Eugene D III, MD;  Location: [facility];  Service: Urology;  Laterality: Left;  . FINGER SURGERY Right 04/19/2014   RIGHT INDEX & RING FINGER   FROM DOG BITE   . I & D EXTREMITY Right 04/20/2014   Procedure: IRRIGATION AND DEBRIDEMENT right index finger and right ring finger;  Surgeon: Dominica SeverinWilliam Gramig, MD;  Location: [facility] OR;  Service: Orthopedics;  Laterality: Right;  . I & D EXTREMITY Right 04/21/2014   Procedure: IRRIGATION AND DEBRIDEMENT RIGHT HAND RING FINGER, INDEX FINGER AND Flexor Tenolysis and FDS Tenotomy;  Surgeon: Dominica SeverinWilliam Gramig, MD;  Location: [facility] OR;  Service: Orthopedics;  Laterality: Right;    Social History  reports that he has quit smoking. His smoking use included cigarettes and cigars. He has a 7.00 pack-year smoking history. He has never used smokeless tobacco. He reports previous alcohol use. He reports that he does not use drugs.  Allergies  Allergen Reactions  . Amlodipine     Edema w/10 mg  . Atorvastatin     REACTION: leg cramps  . Penicillins Hives    Has patient had a PCN reaction causing immediate rash, facial/tongue/throat swelling, SOB or lightheadedness with hypotension: Yes Has patient had  a PCN reaction causing severe rash involving mucus membranes or skin necrosis: No Has patient had a PCN reaction that required hospitalization No Has patient had a PCN reaction occurring within the last 10 years: No If all of the above answers are "NO", then may proceed with Cephalosporin use.   . Sulfonamide Derivatives Hives  . Telmisartan     ?fatigue  . Sulfa  Antibiotics Other (See Comments) and Palpitations    Sweating, passed out    Family History  Problem Relation Age of Onset  . Diabetes Mother   . Tremor Father   . Diabetes Other     Prior to Admission medications   Medication Sig Start Date End Date Taking? Authorizing Provider  B Complex-C (B-COMPLEX WITH VITAMIN C) tablet Take 1 tablet by mouth daily.    [provider]  carbidopa-levodopa (SINEMET IR) 25-100 MG tablet Take 1 tablet three times a day with meals 10/19/19   Van Clines, MD  Cholecalciferol (VITAMIN D) 1000 UNITS capsule Take 1 capsule (1,000 Units total) by mouth daily. 09/09/12   Plotnikov, Georgina Quint, MD  cyanocobalamin (,VITAMIN B-12,) 1000 MCG/ML injection 1 ml sq for 5 days, then 1 ml weekly for 4 weeks, then 1 ml every 2 weeks Patient taking differently: Inject 1,000 mcg into the muscle See admin instructions. 1 ml sq for 5 days, then 1 ml weekly for 4 weeks, then 1 ml every 2 weeks 11/19/18   Plotnikov, Georgina Quint, MD  doxazosin (CARDURA) 4 MG tablet Take 0.5 tablets (2 mg total) by mouth daily. 04/13/20   Plotnikov, Georgina Quint, MD  loratadine-pseudoephedrine (CLARITIN-D 24-HOUR) 10-240 MG 24 hr tablet Take 1 tablet by mouth daily as needed for allergies.    [provider]  rivastigmine (EXELON) 1.5 MG capsule Take 1 capsule (1.5 mg total) by mouth 2 (two) times daily. 10/19/19   Van Clines, MD  SYRINGE-NEEDLE, DISP, 3 ML (BD ECLIPSE SYRINGE) 25G X 1" 3 ML MISC Use sq as directed 11/19/18   Plotnikov, Georgina Quint, MD  torsemide (DEMADEX) 100 MG tablet Take 0.5-1 tablets (50-100 mg total) by mouth daily as needed. 04/13/20   Plotnikov, Georgina Quint, MD  traZODone (DESYREL) 50 MG tablet TAKE 2 TABLETS BY MOUTH AT BEDTIME 10/19/19   Van Clines, MD  valACYclovir (VALTREX) 1000 MG tablet Take 1 tablet (1,000 mg total) by mouth 3 (three) times daily. 11/17/19   Plotnikov, Georgina Quint, MD    Physical Exam: Vitals:   05/03/20 1415 05/03/20 1530 05/03/20  1545 05/03/20 1723  BP:    (!) 153/83  Pulse: 65 66 63 76  Resp: 13 13 11 18   Temp:      TempSrc:      SpO2: 99% 98% 99% 99%    Constitutional: NAD, calm, comfortable, not oriented to time Eyes: PERRL, lids and conjunctivae normal ENMT: Mucous membranes are moist.  Respiratory: clear to auscultation bilaterally, no wheezing, no crackles. Normal respiratory effort. No accessory muscle use.  Cardiovascular: Regular rate and rhythm,  No extremity edema. 2+ pedal pulses. No carotid bruits.  Abdomen: no tenderness, not distended, Bowel sounds positive.  Musculoskeletal: no clubbing / cyanosis. No joint deformity upper and lower extremities. Good ROM, no contractures. Normal muscle tone.  Skin: no rashes, lesions, ulcers. No induration Neurologic: CN 2-12 grossly intact. Sensation intact, Strength 5/5 in all 4.  Psychiatric: Calm, cooperative and pleasant, not oriented to time   Labs on Admission: I have personally reviewed following labs and  imaging studies  CBC: Recent Labs  Lab 05/03/20 1308  WBC 1.9*  NEUTROABS 1.1*  HGB 10.8*  HCT 31.3*  MCV 88.7  PLT 105*    Basic Metabolic Panel: Recent Labs  Lab 05/03/20 1308 05/03/20 1632  NA 141 139  K 3.3* 3.7  CL 110 105  CO2 24 27  GLUCOSE 108* 112*  BUN 20 21  CREATININE 1.48* 1.71*  CALCIUM 6.8* 8.2*    GFR: CrCl cannot be calculated (Unknown ideal weight.).  Liver Function Tests: Recent Labs  Lab 05/03/20 1308 05/03/20 1632  AST 12* 13*  ALT <5 <5  ALKPHOS 52 66  BILITOT 0.9 1.0  PROT 4.8* 6.1*  ALBUMIN 2.7* 3.5    Urine analysis:    Component Value Date/Time   COLORURINE STRAW (A) 05/03/2020 1440   APPEARANCEUR CLEAR 05/03/2020 1440   LABSPEC 1.005 05/03/2020 1440   PHURINE 7.0 05/03/2020 1440   GLUCOSEU NEGATIVE 05/03/2020 1440   GLUCOSEU NEGATIVE 08/04/2018 0949   HGBUR SMALL (A) 05/03/2020 1440   BILIRUBINUR NEGATIVE 05/03/2020 1440   KETONESUR NEGATIVE 05/03/2020 1440   PROTEINUR NEGATIVE  05/03/2020 1440   UROBILINOGEN 1.0 08/04/2018 0949   NITRITE NEGATIVE 05/03/2020 1440   LEUKOCYTESUR NEGATIVE 05/03/2020 1440    Radiological Exams on Admission: DG Chest 2 View  Result Date: 05/03/2020 CLINICAL DATA:  79 year old male with a history of syncope EXAM: CHEST - 2 VIEW COMPARISON:  07/03/2019 FINDINGS: Cardiomediastinal silhouette likely unchanged in size and contour given the positioning. No pneumothorax. No pleural effusion. Coarsened interstitial markings bilateral lungs without confluent airspace disease. No acute displaced fracture. Degenerative changes of the visualized spine. IMPRESSION: Negative for acute cardiopulmonary disease Electronically Signed   By: Gilmer Mor D.O.   On: 05/03/2020 13:50    EKG: Independently reviewed. sinus rhythm heart rate 63, no acute ST-T changes, QTC unremarkable.  Assessment/Plan Active Problems:   Hypokalemia   Falls frequently   Dementia due to Parkinson's disease without behavioral disturbance (HCC)   Syncope   Neutropenia (HCC)   Thrombocytopenia (HCC)    Syncope x2, with reported lethargy -Currently fully alert, interactive and pleasant -CT head ordered, not done yet, he does not have any focal symptom -Keep on telemetry -Get EEG, echocardiogram ,orthostatic vital signs (hold Demadex), he does appear slightly dehydrated on exam, he received 1 L normal saline in the ED, will continue normal saline at 75 cc/h.  Neutropenia, thrombocytopenia -Possibly due to Covid infection -UA unremarkable, chest x-ray no acute infiltrate, will add on blood culture -Repeat CBC in the morning  Normocytic anemia -Likely anemia of chronic disease hemoglobin close to baseline  Hypokalemia, replace K, check mag  CKD 3 a -Creatinine appears at baseline, UA unremarkable -Renal dosing meds -Hold Demadex for now  BPH -On Cardura, Cardura may cause syncope, if no other cause of syncope identified, may consider change Cardura to  Flomax   Parkinson's disease/dementia/history of frequent falls -Currently appears at baseline -Continue home medication -will get PT eval  COVID-19 infection -Does not appear have symptom, chest x-ray no acute findings -Patient would qualify for monoclonal antibody infusion, however family declined currently, family will discuss further tomorrow regarding monoclonal antibody infusion -Follow-up on Covid labs  DVT prophylaxis: lovenox    Code Status:   DNR ( per previous documentation) Family Communication:    Patient is from: home   Anticipated DC to: Pending PT eval   Anticipated DC date: TBD    Consults called:  none Admission status:  observation  Voice Recognition Reubin Milan dictation system was used to create this note, attempts have been made to correct errors. Please contact the author with questions and/or clarifications.  Albertine Grates MD PhD FACP [facility]  How to contact the [facility] Attending or Consulting provider 7A - 7P or covering provider during after hours 7P -7A, for this patient?   1. Check the care team in [facility] and look for a) attending/consulting TR[facility] provider listed and b) the [facility] team listed 2. Log into [email or web address] and use [place]'s universal password to access. If you do not have the password, please contact the hospital operator. 3. Locate the [facility] provider you are looking for under [facility] and page to a number that you can be directly reached. 4. If you still have difficulty reaching the provider, please page the [facility] (Director on Call) for the Hospitalists listed on amion for assistance.  05/03/2020, 7:20 PM

## 2020-05-04 ENCOUNTER — Observation Stay (HOSPITAL_COMMUNITY): Payer: Medicare Other

## 2020-05-04 ENCOUNTER — Inpatient Hospital Stay (HOSPITAL_COMMUNITY)
Admission: EM | Admit: 2020-05-04 | Discharge: 2020-05-04 | Disposition: A | Discharge status: Home / Self Care | Payer: Medicare Other | Source: Home / Self Care | Attending: Internal Medicine | Admitting: Internal Medicine

## 2020-05-04 ENCOUNTER — Other Ambulatory Visit: Payer: Self-pay

## 2020-05-04 ENCOUNTER — Inpatient Hospital Stay (HOSPITAL_COMMUNITY): Payer: Medicare Other

## 2020-05-04 DIAGNOSIS — R4182 Altered mental status, unspecified: Secondary | ICD-10-CM | POA: Diagnosis present

## 2020-05-04 DIAGNOSIS — I951 Orthostatic hypotension: Secondary | ICD-10-CM | POA: Diagnosis present

## 2020-05-04 DIAGNOSIS — Z882 Allergy status to sulfonamides status: Secondary | ICD-10-CM | POA: Diagnosis not present

## 2020-05-04 DIAGNOSIS — E538 Deficiency of other specified B group vitamins: Secondary | ICD-10-CM | POA: Diagnosis present

## 2020-05-04 DIAGNOSIS — I639 Cerebral infarction, unspecified: Secondary | ICD-10-CM | POA: Diagnosis not present

## 2020-05-04 DIAGNOSIS — J449 Chronic obstructive pulmonary disease, unspecified: Secondary | ICD-10-CM | POA: Diagnosis present

## 2020-05-04 DIAGNOSIS — Z8673 Personal history of transient ischemic attack (TIA), and cerebral infarction without residual deficits: Secondary | ICD-10-CM | POA: Diagnosis not present

## 2020-05-04 DIAGNOSIS — R55 Syncope and collapse: Secondary | ICD-10-CM

## 2020-05-04 DIAGNOSIS — G9340 Encephalopathy, unspecified: Secondary | ICD-10-CM | POA: Diagnosis not present

## 2020-05-04 DIAGNOSIS — E876 Hypokalemia: Secondary | ICD-10-CM | POA: Diagnosis present

## 2020-05-04 DIAGNOSIS — U071 COVID-19: Secondary | ICD-10-CM | POA: Diagnosis present

## 2020-05-04 DIAGNOSIS — D631 Anemia in chronic kidney disease: Secondary | ICD-10-CM | POA: Diagnosis present

## 2020-05-04 DIAGNOSIS — Z888 Allergy status to other drugs, medicaments and biological substances status: Secondary | ICD-10-CM | POA: Diagnosis not present

## 2020-05-04 DIAGNOSIS — E785 Hyperlipidemia, unspecified: Secondary | ICD-10-CM | POA: Diagnosis present

## 2020-05-04 DIAGNOSIS — Z9181 History of falling: Secondary | ICD-10-CM | POA: Diagnosis not present

## 2020-05-04 DIAGNOSIS — N400 Enlarged prostate without lower urinary tract symptoms: Secondary | ICD-10-CM | POA: Diagnosis present

## 2020-05-04 DIAGNOSIS — I129 Hypertensive chronic kidney disease with stage 1 through stage 4 chronic kidney disease, or unspecified chronic kidney disease: Secondary | ICD-10-CM | POA: Diagnosis present

## 2020-05-04 DIAGNOSIS — D61818 Other pancytopenia: Secondary | ICD-10-CM | POA: Diagnosis present

## 2020-05-04 DIAGNOSIS — R296 Repeated falls: Secondary | ICD-10-CM | POA: Diagnosis not present

## 2020-05-04 DIAGNOSIS — E860 Dehydration: Secondary | ICD-10-CM | POA: Diagnosis present

## 2020-05-04 DIAGNOSIS — Z87440 Personal history of urinary (tract) infections: Secondary | ICD-10-CM | POA: Diagnosis not present

## 2020-05-04 DIAGNOSIS — Z87891 Personal history of nicotine dependence: Secondary | ICD-10-CM | POA: Diagnosis not present

## 2020-05-04 DIAGNOSIS — G9341 Metabolic encephalopathy: Secondary | ICD-10-CM | POA: Diagnosis present

## 2020-05-04 DIAGNOSIS — Z66 Do not resuscitate: Secondary | ICD-10-CM | POA: Diagnosis present

## 2020-05-04 DIAGNOSIS — Z23 Encounter for immunization: Secondary | ICD-10-CM | POA: Diagnosis present

## 2020-05-04 DIAGNOSIS — D708 Other neutropenia: Secondary | ICD-10-CM | POA: Diagnosis not present

## 2020-05-04 DIAGNOSIS — G20 Parkinson's disease: Secondary | ICD-10-CM | POA: Diagnosis present

## 2020-05-04 DIAGNOSIS — Z880 Allergy status to penicillin: Secondary | ICD-10-CM | POA: Diagnosis not present

## 2020-05-04 DIAGNOSIS — F0280 Dementia in other diseases classified elsewhere without behavioral disturbance: Secondary | ICD-10-CM | POA: Diagnosis present

## 2020-05-04 DIAGNOSIS — N1831 Chronic kidney disease, stage 3a: Secondary | ICD-10-CM | POA: Diagnosis present

## 2020-05-04 LAB — CBC WITH DIFFERENTIAL/PLATELET
Abs Immature Granulocytes: 0 10*3/uL (ref 0.00–0.07)
Basophils Absolute: 0 10*3/uL (ref 0.0–0.1)
Basophils Relative: 0 %
Eosinophils Absolute: 0 10*3/uL (ref 0.0–0.5)
Eosinophils Relative: 0 %
HCT: 32.6 % — ABNORMAL LOW (ref 39.0–52.0)
Hemoglobin: 11.4 g/dL — ABNORMAL LOW (ref 13.0–17.0)
Immature Granulocytes: 0 %
Lymphocytes Relative: 36 %
Lymphs Abs: 1 10*3/uL (ref 0.7–4.0)
MCH: 30.6 pg (ref 26.0–34.0)
MCHC: 35 g/dL (ref 30.0–36.0)
MCV: 87.6 fL (ref 80.0–100.0)
Monocytes Absolute: 0.3 10*3/uL (ref 0.1–1.0)
Monocytes Relative: 12 %
Neutro Abs: 1.4 10*3/uL — ABNORMAL LOW (ref 1.7–7.7)
Neutrophils Relative %: 52 %
Platelets: 115 10*3/uL — ABNORMAL LOW (ref 150–400)
RBC: 3.72 MIL/uL — ABNORMAL LOW (ref 4.22–5.81)
RDW: 11.5 % (ref 11.5–15.5)
WBC: 2.7 10*3/uL — ABNORMAL LOW (ref 4.0–10.5)
nRBC: 0 % (ref 0.0–0.2)

## 2020-05-04 LAB — BASIC METABOLIC PANEL
Anion gap: 9 (ref 5–15)
BUN: 18 mg/dL (ref 8–23)
CO2: 24 mmol/L (ref 22–32)
Calcium: 8.1 mg/dL — ABNORMAL LOW (ref 8.9–10.3)
Chloride: 102 mmol/L (ref 98–111)
Creatinine, Ser: 1.51 mg/dL — ABNORMAL HIGH (ref 0.61–1.24)
GFR, Estimated: 46 mL/min — ABNORMAL LOW (ref >60)
Glucose, Bld: 102 mg/dL — ABNORMAL HIGH (ref 70–99)
Potassium: 3 mmol/L — ABNORMAL LOW (ref 3.5–5.1)
Sodium: 135 mmol/L (ref 135–145)

## 2020-05-04 LAB — ECHOCARDIOGRAM LIMITED
Area-P 1/2: 2.22 cm2
Height: 68 in
S' Lateral: 2.6 cm
Weight: 2733.7 oz

## 2020-05-04 LAB — MAGNESIUM: Magnesium: 1.7 mg/dL (ref 1.7–2.4)

## 2020-05-04 MED ORDER — TRAZODONE HCL 100 MG PO TABS
100.0000 mg | ORAL_TABLET | Freq: Every day | ORAL | Status: DC
  Administered 2020-05-04 – 2020-05-08 (×5): 100 mg via ORAL
  Filled 2020-05-04 (×5): qty 1

## 2020-05-04 MED ORDER — ASPIRIN 325 MG PO TABS
325.0000 mg | ORAL_TABLET | Freq: Every day | ORAL | Status: DC
  Administered 2020-05-04 – 2020-05-08 (×5): 325 mg via ORAL
  Filled 2020-05-04 (×4): qty 1

## 2020-05-04 MED ORDER — SODIUM CHLORIDE 0.9 % IV SOLN
Freq: Once | INTRAVENOUS | Status: AC
  Filled 2020-05-04: qty 20

## 2020-05-04 MED ORDER — RIVASTIGMINE TARTRATE 1.5 MG PO CAPS
1.5000 mg | ORAL_CAPSULE | Freq: Two times a day (BID) | ORAL | Status: DC
  Administered 2020-05-04 – 2020-05-09 (×11): 1.5 mg via ORAL
  Filled 2020-05-04 (×13): qty 1

## 2020-05-04 MED ORDER — POTASSIUM CHLORIDE IN NACL 20-0.9 MEQ/L-% IV SOLN
INTRAVENOUS | Status: DC
  Filled 2020-05-04 (×5): qty 1000

## 2020-05-04 MED ORDER — CARBIDOPA-LEVODOPA 25-100 MG PO TABS
1.0000 | ORAL_TABLET | Freq: Three times a day (TID) | ORAL | Status: DC
  Administered 2020-05-04 – 2020-05-09 (×17): 1 via ORAL
  Filled 2020-05-04 (×17): qty 1

## 2020-05-04 MED ORDER — MAGNESIUM SULFATE 2 GM/50ML IV SOLN
2.0000 g | Freq: Once | INTRAVENOUS | Status: AC
  Administered 2020-05-04: 2 g via INTRAVENOUS
  Filled 2020-05-04: qty 50

## 2020-05-04 MED ORDER — ALBUTEROL SULFATE HFA 108 (90 BASE) MCG/ACT IN AERS
2.0000 | INHALATION_SPRAY | Freq: Once | RESPIRATORY_TRACT | Status: DC | PRN
  Filled 2020-05-04: qty 6.7

## 2020-05-04 MED ORDER — SODIUM CHLORIDE 0.9 % IV SOLN: INTRAVENOUS | Status: DC | PRN

## 2020-05-04 MED ORDER — VITAMIN D 25 MCG (1000 UNIT) PO TABS
1000.0000 [IU] | ORAL_TABLET | Freq: Every day | ORAL | Status: DC
  Administered 2020-05-04 – 2020-05-09 (×6): 1000 [IU] via ORAL
  Filled 2020-05-04 (×6): qty 1

## 2020-05-04 MED ORDER — EPINEPHRINE 0.3 MG/0.3ML IJ SOAJ
0.3000 mg | Freq: Once | INTRAMUSCULAR | Status: DC | PRN
  Filled 2020-05-04: qty 0.6

## 2020-05-04 MED ORDER — FAMOTIDINE IN NACL 20-0.9 MG/50ML-% IV SOLN
20.0000 mg | Freq: Once | INTRAVENOUS | Status: DC | PRN
  Filled 2020-05-04: qty 50

## 2020-05-04 MED ORDER — B COMPLEX-C PO TABS
1.0000 | ORAL_TABLET | Freq: Every day | ORAL | Status: DC
  Administered 2020-05-04 – 2020-05-09 (×6): 1 via ORAL
  Filled 2020-05-04 (×6): qty 1

## 2020-05-04 MED ORDER — METHYLPREDNISOLONE SODIUM SUCC 125 MG IJ SOLR: 125.0000 mg | Freq: Once | INTRAMUSCULAR | Status: DC | PRN

## 2020-05-04 MED ORDER — ENOXAPARIN SODIUM 40 MG/0.4ML ~~LOC~~ SOLN
40.0000 mg | SUBCUTANEOUS | Status: DC
  Administered 2020-05-04 – 2020-05-09 (×6): 40 mg via SUBCUTANEOUS
  Filled 2020-05-04 (×6): qty 0.4

## 2020-05-04 MED ORDER — DIPHENHYDRAMINE HCL 50 MG/ML IJ SOLN: 50.0000 mg | Freq: Once | INTRAMUSCULAR | Status: DC | PRN

## 2020-05-04 MED ORDER — POTASSIUM CHLORIDE CRYS ER 20 MEQ PO TBCR
40.0000 meq | EXTENDED_RELEASE_TABLET | Freq: Once | ORAL | Status: AC
  Administered 2020-05-04: 40 meq via ORAL
  Filled 2020-05-04: qty 2

## 2020-05-04 NOTE — Progress Notes (Signed)
Called floor about this pt RN is busy at this time. MRI on hold until RN calls back.

## 2020-05-04 NOTE — Progress Notes (Signed)
Provider notified of change of MEWS to yellow d/t BP (patient confused and agitated). No new orders, continue current POC. Will reassess VS in 2 hours

## 2020-05-04 NOTE — Progress Notes (Signed)
EEG Completed; Results Pending  

## 2020-05-04 NOTE — Progress Notes (Addendum)
PROGRESS NOTE  WITT PLITT  FAO:[id number] DOB: 1940-10-08 DOA: 05/03/2020 PCP: [name], MD  Outpatient Specialists: Neurology, Dr. Karel Jarvis Brief Narrative: [name] is an 79 y.o. male with a history of Parkinson's disease, dementia, increasing falls at home, covid-19 vaccinations, and BPH who presented to the ED from home after 2 episodes of loss of consciousness. He has had altered mental status since arrival, so history is limited. In the ED he was hypertensive to 169/91, afebrile without respiratory distress or hypoxia. Pancytopenia (ANC 1.1k, plt 105k, hgb 10.8g/dl) and hypokalemia were noted on labs. No bacteriuria on UA or infiltrate on CXR, though screening SARS-CoV-2 PCR was positive. Subsequent inflammatory markers have been negative. He was admitted for syncope evaluation with work up pending including CT head, echo, and EEG. An age-indeterminate infarct of the posterior left cerebellum was noted on CT for which MRI is ordered.   Assessment & Plan: Active Problems:   Hypokalemia   Falls frequently   Dementia due to Parkinson's disease without behavioral disturbance (HCC)   Syncope   Neutropenia (HCC)   Thrombocytopenia (HCC)   COVID-19 virus infection  Left posterior cerebellum infarct: Age-indeterminate.  - MRI brain - Carotid U/S - Permissive HTN - Echocardiogram already ordered.  - Give aspirin. Will hold on new start statin with allergy to atorvastatin. Lipid panel, HbA1c in AM.  - Continue telemetry, if no dysrhythmias noted, with high index of suspicion for cardioembolic source, would likely recommend holter monitoring at discharge. No AFib on my personal review this PM.  - PT, OT ordered.   Altered mental status on chronic dementia: Unclear chronicity and etiology. Could be attributable to delirium, CVA, seizure, or Parkinson's-related.  - Delirium precautions. Would ideally have family present, though this is not possible with covid-related  visitation restrictions.  - Check B12, folic acid in AM - EEG pending - Plan to discuss formally with neurology once data available.   Syncope:  - Continue telemetry. Personally reviewed strips in EMR revealing.  - EEG pending - Echo pending. - ?if related to autonomic dysfunction related to PD. - Continue IV hydration with minimal po intake today. Monitor orthostatic VS's  Covid-19 infection: Incidental on admission screening PCR in vaccinated patient [employer] 3/25, 4/19) with no infiltrate on CXR, no inflammatory marker elevation, and no infectious or pulmonary symptoms. - I discussed mAb Tx at great length with the patient's wife who consents to give this to the patient tonight. - This is an additional indication for prophylactic anticoagulation despite anemia/thrombocytopenia.  Pancytopenia: Likely related to covid and anemia of chronic disease. - Continue monitoring. Showing improvement. - blood culture pending.  Stage IIIa CKD: UA with no proteinuria, but with hyaline casts.  - Hold torsemide for now - Avoid nephrotoxins.   Parkinson's disease: Followed by Dr. Karel Jarvis.  - Continue sinemet, rivastigmine  Hypokalemia:  - Supplement today, give magnesium due to refractory nature, recheck both in AM.  BPH:  - Continue cardura for now.   DVT prophylaxis: Lovenox Code Status: DNR Family Communication: None at bedside, spoke with wife for >27 minutes this evening by phone. Disposition Plan:  Status is: Inpatient  Remains inpatient appropriate because:Altered mental status, Ongoing diagnostic testing needed not appropriate for outpatient work up and Inpatient level of care appropriate due to severity of illness  Dispo: The patient is from: Home              Anticipated d/c is to: TBD  Anticipated d/c date is: 1 day              Patient currently is not medically stable to d/c.  Consultants:   None  Procedures:   Echo  EEG  Antimicrobials:  None    Subjective: Pt confused, denies any pain or dyspnea. Can't recall where he is or why he'd be in the hospital.   Objective: Vitals:   05/04/20 0021 05/04/20 0445 05/04/20 0831 05/04/20 1337  BP: (!) 188/97 (!) 166/88 (!) 189/100 (!) 180/84  Pulse: 68 71 83 70  Resp: 17 17 18 18   Temp: 98.2 F (36.8 C) 97.8 F (36.6 C) 97.8 F (36.6 C) 98.4 F (36.9 C)  TempSrc: Oral Oral Oral   SpO2: 99% 99% 98% 99%  Weight:      Height:        Intake/Output Summary (Last 24 hours) at 05/04/2020 1523 Last data filed at 05/04/2020 0957 Gross per 24 hour  Intake 980.51 ml  Output 2750 ml  Net -1769.49 ml   Filed Weights   05/04/20 0000  Weight: 77.5 kg    Gen: 79 y.o. male in no distress Pulm: Non-labored breathing. Clear to auscultation bilaterally.  CV: Regular rate and rhythm. No murmur, rub, or gallop. No JVD, no pedal edema. GI: Abdomen soft, non-tender, non-distended, with normoactive bowel sounds. No organomegaly or masses felt. Ext: Warm, no deformities Skin: No rashes, lesions or ulcers Neuro: Alert and not oriented. Limited cooperation with exam, though no focal sensory or motor deficits are noted. No asterixis. No dysarthria.  Psych: Judgement and insight appear impaired, decreased cognitive ability. Mood & affect appropriate.   Data Reviewed: I have personally reviewed following labs and imaging studies  CBC: Recent Labs  Lab 05/03/20 1308 05/04/20 0354  WBC 1.9* 2.7*  NEUTROABS 1.1* 1.4*  HGB 10.8* 11.4*  HCT 31.3* 32.6*  MCV 88.7 87.6  PLT 105* 115*   Basic Metabolic Panel: Recent Labs  Lab 05/03/20 1308 05/03/20 1632 05/04/20 0354  NA 141 139 135  K 3.3* 3.7 3.0*  CL 110 105 102  CO2 24 27 24   GLUCOSE 108* 112* 102*  BUN 20 21 18   CREATININE 1.48* 1.71* 1.51*  CALCIUM 6.8* 8.2* 8.1*  MG  --   --  1.7   GFR: Estimated Creatinine Clearance: 37.7 mL/min (A) (by C-G formula based on SCr of 1.51 mg/dL (H)). Liver Function Tests: Recent Labs  Lab  05/03/20 1308 05/03/20 1632  AST 12* 13*  ALT <5 <5  ALKPHOS 52 66  BILITOT 0.9 1.0  PROT 4.8* 6.1*  ALBUMIN 2.7* 3.5   No results for input(s): LIPASE, AMYLASE in the last 168 hours. No results for input(s): AMMONIA in the last 168 hours. Coagulation Profile: No results for input(s): INR, PROTIME in the last 168 hours. Cardiac Enzymes: No results for input(s): CKTOTAL, CKMB, CKMBINDEX, TROPONINI in the last 168 hours. BNP (last 3 results) No results for input(s): PROBNP in the last 8760 hours. HbA1C: No results for input(s): HGBA1C in the last 72 hours. CBG: No results for input(s): GLUCAP in the last 168 hours. Lipid Profile: Recent Labs    05/03/20 1632  TRIG 62   Thyroid Function Tests: No results for input(s): TSH, T4TOTAL, FREET4, T3FREE, THYROIDAB in the last 72 hours. Anemia Panel: Recent Labs    05/03/20 1632  FERRITIN 305   Urine analysis:    Component Value Date/Time   COLORURINE STRAW (A) 05/03/2020 1440   APPEARANCEUR CLEAR 05/03/2020  1440   LABSPEC 1.005 05/03/2020 1440   PHURINE 7.0 05/03/2020 1440   GLUCOSEU NEGATIVE 05/03/2020 1440   GLUCOSEU NEGATIVE 08/04/2018 0949   HGBUR SMALL (A) 05/03/2020 1440   BILIRUBINUR NEGATIVE 05/03/2020 1440   KETONESUR NEGATIVE 05/03/2020 1440   PROTEINUR NEGATIVE 05/03/2020 1440   UROBILINOGEN 1.0 08/04/2018 0949   NITRITE NEGATIVE 05/03/2020 1440   LEUKOCYTESUR NEGATIVE 05/03/2020 1440   Recent Results (from the past 240 hour(s))  Respiratory Panel by RT PCR (Flu A&B, Covid) - Nasopharyngeal Swab     Status: Abnormal   Collection Time: 05/03/20  1:13 PM   Specimen: Nasopharyngeal Swab  Result Value Ref Range Status   SARS Coronavirus 2 by RT PCR POSITIVE (A) NEGATIVE Final    Comment: RESULT CALLED TO, READ BACK BY AND VERIFIED WITH: FRANKLIN,C. RN @1510  05/03/20 BILLINGSLEY,L    Influenza A by PCR NEGATIVE NEGATIVE Final   Influenza B by PCR NEGATIVE NEGATIVE Final    Comment: Performed at [facility], [address] [place], [place]  Culture, blood (single)     Status: None (Preliminary result)   Collection Time: 05/03/20  4:32 PM   Specimen: BLOOD RIGHT FOREARM  Result Value Ref Range Status   Specimen Description   Final    BLOOD RIGHT FOREARM Performed at [facility], [address] [place], [place]    Special Requests   Final    BOTTLES DRAWN AEROBIC AND ANAEROBIC Blood Culture adequate volume Performed at [facility], [address], [place], [place]    Culture   Final    NO GROWTH < 12 HOURS Performed at [facility], [address], [place], [place]    Report Status PENDING  Incomplete      Radiology Studies: DG Chest 2 View  Result Date: 05/03/2020 CLINICAL DATA:  79 year old male with a history of syncope EXAM: CHEST - 2 VIEW COMPARISON:  07/03/2019 FINDINGS: Cardiomediastinal silhouette likely unchanged in size and contour given the positioning. No pneumothorax. No pleural effusion. Coarsened interstitial markings bilateral lungs without confluent airspace disease. No acute displaced fracture. Degenerative changes of the visualized spine. IMPRESSION: Negative for acute cardiopulmonary disease Electronically Signed   By: 08/31/2019 D.O.   On: 05/03/2020 13:50   CT Head Wo Contrast  Result Date: 05/03/2020 CLINICAL DATA:  Mental status change, unknown cause. EXAM: CT HEAD WITHOUT CONTRAST TECHNIQUE: Contiguous axial images were obtained from the base of the skull through the vertex without intravenous contrast. COMPARISON:  Head CT 02/16/2019.  Brain MRI 11/23/2018. FINDINGS: Brain: Moderate cerebral atrophy. Moderate ill-defined hypoattenuation within the cerebral white matter is nonspecific, but compatible with chronic small vessel ischemic disease. A small infarct is questioned within the posterior left cerebellar hemisphere, new from the prior head CT of  02/16/2019 but otherwise age indeterminate (series 2, image 9) (series 5, image 56). There is no acute intracranial hemorrhage. No demarcated cortical infarct. No extra-axial fluid collection. No evidence of intracranial mass. No midline shift. Vascular: No hyperdense vessel.  Atherosclerotic calcifications. Skull: Normal. Negative for fracture or focal lesion. Sinuses/Orbits: Visualized orbits show no acute finding. Mild ethmoid and maxillary sinus mucosal thickening. IMPRESSION: A small infarct is questioned within the posterior left cerebellar hemisphere, new from the prior head CT of 02/16/2019 but otherwise age indeterminate. Moderate cerebral atrophy and chronic small vessel ischemic disease. Mild ethmoid and maxillary sinus mucosal thickening. Electronically Signed   By: 02/18/2019  Golden DO   On: 05/03/2020 19:26    Scheduled Meds: . B-complex with vitamin C  1 tablet Oral Daily  . carbidopa-levodopa  1 tablet Oral TID WC  . cholecalciferol  1,000 Units Oral Daily  . doxazosin  2 mg Oral Daily  . enoxaparin (LOVENOX) injection  40 mg Subcutaneous Q24H  . potassium chloride  40 mEq Oral Once  . rivastigmine  1.5 mg Oral BID  . traZODone  100 mg Oral QHS   Continuous Infusions: . sodium chloride 75 mL/hr at 05/04/20 1147  . magnesium sulfate bolus IVPB       LOS: 0 days   Time spent: 35 minutes.  Tyrone Nine, MD [facility] [email or web address] 05/04/2020, 3:23 PM

## 2020-05-04 NOTE — Progress Notes (Signed)
  Echocardiogram 2D Echocardiogram has been performed.  Pieter Partridge 05/04/2020, 2:04 PM

## 2020-05-05 ENCOUNTER — Encounter (HOSPITAL_COMMUNITY): Payer: Self-pay | Admitting: Family Medicine

## 2020-05-05 ENCOUNTER — Inpatient Hospital Stay (HOSPITAL_COMMUNITY): Payer: Medicare Other

## 2020-05-05 DIAGNOSIS — I639 Cerebral infarction, unspecified: Secondary | ICD-10-CM

## 2020-05-05 DIAGNOSIS — R55 Syncope and collapse: Secondary | ICD-10-CM

## 2020-05-05 DIAGNOSIS — R4182 Altered mental status, unspecified: Secondary | ICD-10-CM

## 2020-05-05 LAB — CBC WITH DIFFERENTIAL/PLATELET
Abs Immature Granulocytes: 0 10*3/uL (ref 0.00–0.07)
Basophils Absolute: 0 10*3/uL (ref 0.0–0.1)
Basophils Relative: 0 %
Eosinophils Absolute: 0 10*3/uL (ref 0.0–0.5)
Eosinophils Relative: 0 %
HCT: 31.4 % — ABNORMAL LOW (ref 39.0–52.0)
Hemoglobin: 11.2 g/dL — ABNORMAL LOW (ref 13.0–17.0)
Immature Granulocytes: 0 %
Lymphocytes Relative: 24 %
Lymphs Abs: 0.6 10*3/uL — ABNORMAL LOW (ref 0.7–4.0)
MCH: 30.5 pg (ref 26.0–34.0)
MCHC: 35.7 g/dL (ref 30.0–36.0)
MCV: 85.6 fL (ref 80.0–100.0)
Monocytes Absolute: 0.3 10*3/uL (ref 0.1–1.0)
Monocytes Relative: 13 %
Neutro Abs: 1.5 10*3/uL — ABNORMAL LOW (ref 1.7–7.7)
Neutrophils Relative %: 63 %
Platelets: 101 10*3/uL — ABNORMAL LOW (ref 150–400)
RBC: 3.67 MIL/uL — ABNORMAL LOW (ref 4.22–5.81)
RDW: 11.6 % (ref 11.5–15.5)
WBC: 2.4 10*3/uL — ABNORMAL LOW (ref 4.0–10.5)
nRBC: 0 % (ref 0.0–0.2)

## 2020-05-05 LAB — LIPID PANEL
Cholesterol: 139 mg/dL (ref 0–200)
HDL: 39 mg/dL — ABNORMAL LOW (ref >40)
LDL Cholesterol: 90 mg/dL (ref 0–99)
Total CHOL/HDL Ratio: 3.6 RATIO
Triglycerides: 52 mg/dL (ref <150)
VLDL: 10 mg/dL (ref 0–40)

## 2020-05-05 LAB — VITAMIN B12: Vitamin B-12: 2792 pg/mL — ABNORMAL HIGH (ref 180–914)

## 2020-05-05 LAB — BASIC METABOLIC PANEL
Anion gap: 6 (ref 5–15)
BUN: 18 mg/dL (ref 8–23)
CO2: 26 mmol/L (ref 22–32)
Calcium: 8.3 mg/dL — ABNORMAL LOW (ref 8.9–10.3)
Chloride: 105 mmol/L (ref 98–111)
Creatinine, Ser: 1.56 mg/dL — ABNORMAL HIGH (ref 0.61–1.24)
GFR, Estimated: 45 mL/min — ABNORMAL LOW (ref >60)
Glucose, Bld: 106 mg/dL — ABNORMAL HIGH (ref 70–99)
Potassium: 3.3 mmol/L — ABNORMAL LOW (ref 3.5–5.1)
Sodium: 137 mmol/L (ref 135–145)

## 2020-05-05 LAB — HEMOGLOBIN A1C
Hgb A1c MFr Bld: 5.4 % (ref 4.8–5.6)
Mean Plasma Glucose: 108.28 mg/dL

## 2020-05-05 LAB — MAGNESIUM: Magnesium: 2 mg/dL (ref 1.7–2.4)

## 2020-05-05 LAB — FOLATE: Folate: 5.8 ng/mL — ABNORMAL LOW (ref >5.9)

## 2020-05-05 MED ORDER — HALOPERIDOL LACTATE 5 MG/ML IJ SOLN
2.0000 mg | Freq: Once | INTRAMUSCULAR | Status: AC | PRN
  Administered 2020-05-06: 2 mg via INTRAMUSCULAR
  Filled 2020-05-05: qty 1

## 2020-05-05 NOTE — Evaluation (Signed)
Occupational Therapy Evaluation Patient Details Name: [name] MRN: [id number] DOB: [birth year] Today's Date: 05/05/2020    History of Present Illness 79 y.o. male with a history of Parkinson's disease, dementia, increasing falls at home, HTN, covid-19 vaccinations, and BPH who presented to the ED from home after 2 episodes of loss of consciousness. He has had altered mental status since arrival, so history is limited.age-indeterminate infarct of the posterior left cerebellum was noted on CT   Clinical Impression   [name] is an 79 year old man who presents with confusion, lethargy, decreased balance, generalized weakness and decreased activity tolerance. No focal neurological deficits seen. Patient max assist to transfer to side of bed, mod assist x 2 to stand and take steps with RW. Patient exhibiting a posterior lean in sitting and standing and is a very high fall risk. Patient requiring significant assistance for ADLs and verbal cues for sequencing. Patient will benefit from skilled OT services while in hospital to improve deficits and learn compensatory strategies as needed in order to return PLOF. Patient will need short term rehab at discharge prior to returning home.     Follow Up Recommendations  SNF    Equipment Recommendations  None recommended by OT    Recommendations for Other Services       Precautions / Restrictions Precautions Precautions: Fall Restrictions Weight Bearing Restrictions: No      Mobility Bed Mobility Overal bed mobility: Needs Assistance Bed Mobility: Supine to Sit     Supine to sit: Max assist;+2 for safety/equipment     General bed mobility comments: Max assist for supine to sit    Transfers Overall transfer level: Needs assistance Equipment used: Rolling walker (2 wheeled) Transfers: Sit to/from [employer] Sit to Stand: Mod assist;+2 physical assistance;+2 safety/equipment Stand pivot transfers: Mod  assist;+2 physical assistance;+2 safety/equipment       General transfer comment: Mod assist to reduce posterior lean. Needs tactile cues to take steps and assistance for walker management.    Balance Overall balance assessment: Needs assistance Sitting-balance support: No upper extremity supported;Feet supported Sitting balance-Leahy Scale: Poor   Postural control: Posterior lean Standing balance support: During functional activity;Bilateral upper extremity supported Standing balance-Leahy Scale: Poor                             ADL either performed or assessed with clinical judgement   ADL Overall ADL's : Needs assistance/impaired Eating/Feeding: Set up;Sitting   Grooming: Set up;Bed level;Cueing for sequencing;Wash/dry face   Upper Body Bathing: Moderate assistance;Sitting;Cueing for sequencing;Set up   Lower Body Bathing: Maximal assistance;Cueing for sequencing;+2 for physical assistance;Sit to/from stand;+2 for safety/equipment   Upper Body Dressing : Moderate assistance;Sitting;Cueing for sequencing   Lower Body Dressing: +2 for safety/equipment;+2 for physical assistance;Sit to/from stand;Maximal assistance;Set up   Toilet Transfer: +2 for safety/equipment;+2 for physical assistance;Moderate assistance;Stand-pivot;BSC;RW   Toileting- Clothing Manipulation and Hygiene: Total assistance;Sit to/from stand;+2 for physical assistance;+2 for safety/equipment       Functional mobility during ADLs: +2 for physical assistance;+2 for safety/equipment;Moderate assistance;Rolling walker       Vision   Vision Assessment?: No apparent visual deficits     Perception     Praxis      Pertinent Vitals/Pain Pain Assessment: No/denies pain     Hand Dominance Right   Extremity/Trunk Assessment Upper Extremity Assessment Upper Extremity Assessment: RUE deficits/detail;LUE deficits/detail RUE Deficits / Details: WFL ROM and strength LUE  Deficits / Details: WFL  ROM and strength, tremor noted with resistance           Communication     Cognition Arousal/Alertness: Lethargic Behavior During Therapy: WFL for tasks assessed/performed Overall Cognitive Status: No family/caregiver present to determine baseline cognitive functioning                                 General Comments: Alert to self but not situation, place or time. Needs increased time to answer questions and needs verbal cues to maintain attention and alertness.   General Comments       Exercises     Shoulder Instructions      Home Living Family/patient expects to be discharged to:: Skilled nursing facility Living Arrangements: Spouse/significant other                               Additional Comments: Patient unable to answer PLOF questions due to dementia.      Prior Functioning/Environment Level of Independence: Needs assistance  Gait / Transfers Assistance Needed: Per chart has had recent falls at home. ADL's / Homemaking Assistance Needed: Needs assistance for spouse - unknown how much assistance.            OT Problem List: Decreased activity tolerance;Impaired balance (sitting and/or standing);Decreased safety awareness;Decreased cognition;Decreased knowledge of use of DME or AE      OT Treatment/Interventions: Self-care/ADL training;Therapeutic exercise;DME and/or AE instruction;Therapeutic activities;Patient/family education;Balance training    OT Goals(Current goals can be found in the care plan section) Acute Rehab OT Goals OT Goal Formulation: Patient unable to participate in goal setting Time For Goal Achievement: 05/19/20 Potential to Achieve Goals: Fair  OT Frequency: Min 2X/week   Barriers to D/C:            Co-evaluation              AM-PAC OT "6 Clicks" Daily Activity     Outcome Measure Help from another person eating meals?: A Little Help from another person taking care of personal grooming?: A  Little Help from another person toileting, which includes using toliet, bedpan, or urinal?: Total Help from another person bathing (including washing, rinsing, drying)?: A Lot Help from another person to put on and taking off regular upper body clothing?: A Lot Help from another person to put on and taking off regular lower body clothing?: Total 6 Click Score: 12   End of Session Equipment Utilized During Treatment: Gait belt;Rolling walker Nurse Communication: Mobility status  Activity Tolerance: Patient tolerated treatment well Patient left: in chair;with call bell/phone within reach;with chair alarm set  OT Visit Diagnosis: Unsteadiness on feet (R26.81)                Time: 8315-1761 OT Time Calculation (min): 29 min Charges:  OT General Charges $OT Visit: 1 Visit OT Evaluation $OT Eval Moderate Complexity: 1 Mod OT Treatments $Self Care/Home Management : 8-22 mins  Novak Stgermaine, OTR/L Acute Care Rehab Services  Office [phone] Pager: [phone]   Kelli Churn 05/05/2020, 12:51 PM

## 2020-05-05 NOTE — Procedures (Signed)
Patient Name: [name]  MRN: [id number]  Epilepsy Attending: Charlsie Quest  Referring Physician/Provider: Dr Albertine Grates Date: 05/04/2020 Duration: 23.26 minutes  Patient history: 79 year old male with episodes of syncope x2.  EEG to evaluate for seizures.  Level of alertness: Awake, drowsy, sleep, comatose, lethargic  AEDs during EEG study: None  Technical aspects: This EEG study was done with scalp electrodes positioned according to the 10-20 International system of electrode placement. Electrical activity was acquired at a sampling rate of 500Hz  and reviewed with a high frequency filter of 70Hz  and a low frequency filter of 1Hz . EEG data were recorded continuously and digitally stored.   Description: The posterior dominant rhythm consists of 8 Hz activity of moderate voltage (25-35 uV) seen predominantly in posterior head regions, symmetric and reactive to eye opening and eye closing. EEG showed continuous generalized 5 to 6 Hz theta as well as intermittent 2 to 3 Hz delta slowing. Hyperventilation and photic stimulation were not performed.     ABNORMALITY -Continuous slow, generalized  IMPRESSION: This study is suggestive of mild diffuse encephalopathy, nonspecific etiology. No seizures or epileptiform discharges were seen throughout the recording.  [name] 

## 2020-05-05 NOTE — Progress Notes (Signed)
Carotid artery duplex has been completed. Preliminary results can be found in CV Proc through chart review.   05/05/20 9:17 AM Olen Cordial RVT

## 2020-05-05 NOTE — Progress Notes (Signed)
Pt spoke with his wife with the assistance from this RN, wife stated she will call back in the morning to get updates from the MD

## 2020-05-05 NOTE — Consult Note (Signed)
Cardiology Consultation:   Patient ID: [name]; [id number]; Dec 15, 1940   Admit date: 05/03/2020 Date of Consult: 05/05/2020  Primary Care Provider: [name], MD Primary Cardiologist: New to [facility]  Patient Profile:   [name] is a 78 y.o. male with a hx of Parkinson's disease, dementia, increasing falls at home, and BPH  who is being seen today for the evaluation of syncope at the request of Dr. Jarvis Newcomer.   History of Present Illness:   [name] is an 79yo M with a hx as stated above who presented to [facility] 05/03/20 with two episodes of LOC at home. HPI obtained from chart review as he has been somewhat altered since arrival. He had the first episode on Saturday with a transient loss of consciousness while sitting on the couch however was back to baseline fairly quickly but with subsequent lethargy.  The second episode occurred on day of presentation at which time he was unresponsive on the couch after breakfast.  His wife called EMS for transport to the ED for further evaluation. On arrival, BP was elevated at 169/91 with lab work showing neutropenia and thrombocytopenia and mild anemia.  Creatinine was elevated at 1.48 which appears to be at baseline.  UA with no acute infection.  EKG was unremarkable with CXR with no infiltrate.  Plan was for EEG and echocardiogram along with orthostatic vital signs.  His Demadex was on hold.  He was given a 1 L NS.  Unfortunately, COVID-19 swab came back incidentally positive. CT showed indeterminate infarct of the posterior left cerebellum at which time a subsequent MRI was ordered along with carotid ultrasound.  Echocardiogram performed 05/04/2020 which showed an LVEF of 55 to 60% with mild LVH, G1 DD and no valvular disease.  Carotid Doppler study showed R ICA and LICA consistent with a 1 to 39% stenosis.   At the time of my interview [name] had no recollection of his syncopal event.  He reports that he does not understand why he is  in the hospital.  He is pleasantly confused.  He is breathing comfortably and in no acute distress.  He was just placed on telemetry.  We are unable to review the telemetry from [facility].  All of his telemetry during my encounter room was normal sinus rhythm.  There was report per hospital medicine of questionable ventricular tachycardia versus artifact overnight between 05/04/2020 and 05/05/2020.  Unfortunately this was not captured or safe.  He was transferred from [facility] to [facility] and that telemetry cannot be reviewed.  EKG and telemetry on arrival to [facility] are normal.   Past Medical History:  Diagnosis Date  . B12 deficiency 11/19/2018  . Benign prostatic hypertrophy   . Edema   . Hyperlipidemia   . Hypertension   . Insomnia   . Parasomnia 10/14/2011   PSG 12/15/11>>AHI 0.4, SpO2 low 89%, PLMI 16, somnoliloquay, nocturia   . Prediabetes 06/10/2012  . Seasonal allergies   . Sepsis (HCC) 01/2019  . UTI (lower urinary tract infection)     Past Surgical History:  Procedure Laterality Date  . CYSTOSCOPY/URETEROSCOPY/HOLMIUM LASER/STENT PLACEMENT Left 07/05/2019   Procedure: CYSTOSCOPY/URETEROSCOPY/STENT PLACEMENT/REROGRADE;  Surgeon: Crista Elliot, MD;  Location: [facility] ORS;  Service: Urology;  Laterality: Left;  . FINGER SURGERY Right 04/19/2014   RIGHT INDEX & RING FINGER   FROM DOG BITE   . I & D EXTREMITY Right 04/20/2014   Procedure: IRRIGATION AND  DEBRIDEMENT right index finger and right ring finger;  Surgeon: Dominica Severin, MD;  Location: [facility] OR;  Service: Orthopedics;  Laterality: Right;  . I & D EXTREMITY Right 04/21/2014   Procedure: IRRIGATION AND DEBRIDEMENT RIGHT HAND RING FINGER, INDEX FINGER AND Flexor Tenolysis and FDS Tenotomy;  Surgeon: Dominica Severin, MD;  Location: [facility] OR;  Service: Orthopedics;  Laterality: Right;     Prior to Admission medications   Medication Sig Start Date End Date Taking? Authorizing Provider    carbidopa-levodopa (SINEMET IR) 25-100 MG tablet Take 1 tablet three times a day with meals Patient taking differently: Take 1 tablet by mouth 3 (three) times daily.  10/19/19  Yes Van Clines, MD  Cholecalciferol (VITAMIN D) 1000 UNITS capsule Take 1 capsule (1,000 Units total) by mouth daily. 09/09/12  Yes Plotnikov, Georgina Quint, MD  cyanocobalamin (,VITAMIN B-12,) 1000 MCG/ML injection 1 ml sq for 5 days, then 1 ml weekly for 4 weeks, then 1 ml every 2 weeks Patient taking differently: Inject 1,000 mcg into the muscle every 14 (fourteen) days.  11/19/18  Yes Plotnikov, Georgina Quint, MD  doxazosin (CARDURA) 4 MG tablet Take 0.5 tablets (2 mg total) by mouth daily. 04/13/20  Yes Plotnikov, Georgina Quint, MD  loratadine-pseudoephedrine (CLARITIN-D 24-HOUR) 10-240 MG 24 hr tablet Take 1 tablet by mouth daily as needed for allergies.   Yes [provider]  rivastigmine (EXELON) 1.5 MG capsule Take 1 capsule (1.5 mg total) by mouth 2 (two) times daily. 10/19/19  Yes Van Clines, MD  torsemide (DEMADEX) 100 MG tablet Take 0.5-1 tablets (50-100 mg total) by mouth daily as needed. Patient taking differently: Take 50-100 mg by mouth daily as needed (swelling in ankles).  04/13/20  Yes Plotnikov, Georgina Quint, MD  traZODone (DESYREL) 50 MG tablet TAKE 2 TABLETS BY MOUTH AT BEDTIME Patient taking differently: Take 100 mg by mouth at bedtime.  10/19/19  Yes Van Clines, MD  vitamin B-12 (CYANOCOBALAMIN) 1000 MCG tablet Take 1,000 mcg by mouth daily.   Yes [provider]  SYRINGE-NEEDLE, DISP, 3 ML (BD ECLIPSE SYRINGE) 25G X 1" 3 ML MISC Use sq as directed 11/19/18   Plotnikov, Georgina Quint, MD  valACYclovir (VALTREX) 1000 MG tablet Take 1 tablet (1,000 mg total) by mouth 3 (three) times daily. Patient not taking: Reported on 05/04/2020 11/17/19   Plotnikov, Georgina Quint, MD    Inpatient Medications: Scheduled Meds: . aspirin  325 mg Oral Daily  . B-complex with vitamin C  1 tablet Oral Daily  .  carbidopa-levodopa  1 tablet Oral TID WC  . cholecalciferol  1,000 Units Oral Daily  . doxazosin  2 mg Oral Daily  . enoxaparin (LOVENOX) injection  40 mg Subcutaneous Q24H  . rivastigmine  1.5 mg Oral BID  . traZODone  100 mg Oral QHS   Continuous Infusions: . sodium chloride    . 0.9 % NaCl with KCl 20 mEq / L 75 mL/hr at 05/05/20 1004  . famotidine (PEPCID) IV     PRN Meds: sodium chloride, albuterol, diphenhydrAMINE, EPINEPHrine, famotidine (PEPCID) IV, methylPREDNISolone (SOLU-MEDROL) injection  Allergies:    Allergies  Allergen Reactions  . Amlodipine     Edema w/10 mg  . Atorvastatin     REACTION: leg cramps  . Lorazepam     DO NOT GIVE BENZO TO [facility] WITH CONCERNS FOR LEWY BODY DEMENTIA, CAUSES PARADOXICAL WORSENING OF THEIR AGITATION!  . Penicillins Hives    Has patient had a PCN reaction  causing immediate rash, facial/tongue/throat swelling, SOB or lightheadedness with hypotension: Yes Has patient had a PCN reaction causing severe rash involving mucus membranes or skin necrosis: No Has patient had a PCN reaction that required hospitalization No Has patient had a PCN reaction occurring within the last 10 years: No If all of the above answers are "NO", then may proceed with Cephalosporin use.   . Sulfonamide Derivatives Hives  . Telmisartan     ?fatigue  . Sulfa Antibiotics Other (See Comments) and Palpitations    Sweating, passed out    Social History:   Social History   Socioeconomic History  . Marital status: Married    Spouse name: Not on file  . Number of children: Not on file  . Years of education: Not on file  . Highest education level: Not on file  Occupational History  . Occupation: Retired  Tobacco Use  . Smoking status: Former Smoker    Packs/day: 1.00    Years: 7.00    Pack years: 7.00    Types: Cigarettes, Cigars  . Smokeless tobacco: Never Used  . Tobacco comment: cigars day/ 1-2       QIUIT SMOKING IN 2000  Vaping Use  . Vaping  Use: Never used  Substance and Sexual Activity  . Alcohol use: Not Currently    Comment: OCCASSIONAL WINE -1 glass/month  . Drug use: No  . Sexual activity: Not Currently    Partners: Female  Other Topics Concern  . Not on file  Social History Narrative   Rides Motorcycle   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, does landscaping   Right handed   Lives with wife   Social Determinants of Health   Financial Resource Strain:   . Difficulty of Paying Living Expenses: Not on file  Food Insecurity:   . Worried About Programme researcher, broadcasting/film/videounning Out of Food in the Last Year: Not on file  . Ran Out of Food in the Last Year: Not on file  Transportation Needs:   . Lack of Transportation (Medical): Not on file  . Lack of Transportation (Non-Medical): Not on file  Physical Activity:   . Days of Exercise per Week: Not on file  . Minutes of Exercise per Session: Not on file  Stress:   . Feeling of Stress : Not on file  Social Connections:   . Frequency of Communication with Friends and Family: Not on file  . Frequency of Social Gatherings with Friends and Family: Not on file  . Attends Religious Services: Not on file  . Active Member of Clubs or Organizations: Not on file  . Attends BankerClub or Organization Meetings: Not on file  . Marital Status: Not on file  Intimate Partner Violence:   . Fear of Current or Ex-Partner: Not on file  . Emotionally Abused: Not on file  . Physically Abused: Not on file  . Sexually Abused: Not on file    Family History:   Family History  Problem Relation Age of Onset  . Diabetes Mother   . Tremor Father   . Diabetes Other    Family Status:  Family Status  Relation Name Status  . Mother  Deceased  . Father  Deceased  . Other  (Not Specified)    ROS:  Please see the history of present illness.  All other ROS reviewed and negative.     Physical Exam/Data:   Vitals:   05/04/20 0831 05/04/20 1337 05/04/20 2119 05/05/20 0518  BP: (!) 189/100 (!) 180/84 (!) 175/103 [name](!)  184/85    Pulse: 83 70 97 77  Resp: 18 18 17 16   Temp: 97.8 F (36.6 C) 98.4 F (36.9 C) 97.9 F (36.6 C) 98.8 F (37.1 C)  TempSrc: Oral  Oral Oral  SpO2: 98% 99% 95% 95%  Weight:      Height:        Intake/Output Summary (Last 24 hours) at 05/05/2020 1232 Last data filed at 05/05/2020 1000 Gross per 24 hour  Intake 1391.51 ml  Output 1300 ml  Net 91.51 ml   Filed Weights   05/04/20 0000  Weight: 77.5 kg   Body mass index is 25.98 kg/m.   General: Well developed, well nourished, NAD Skin: Warm, dry, intact  Head: Normocephalic, atraumatic, sclera non-icteric, no xanthomas, clear, moist mucus membranes. Neck: Negative for carotid bruits. No JVD Lungs:Clear to ausculation bilaterally. No wheezes, rales, or rhonchi. Breathing is unlabored. Cardiovascular: RRR with S1 S2. No murmurs, rubs, gallops, or LV heave appreciated. Abdomen: Soft, non-tender, non-distended with normoactive bowel sounds. No hepatomegaly, No rebound/guarding. No obvious abdominal masses. MSK: Strength and tone appear normal for age. 5/5 in all extremities Extremities: No edema. No clubbing or cyanosis. DP/PT pulses 2+ bilaterally Neuro: Alert and oriented. No focal deficits. No facial asymmetry. MAE spontaneously. Psych: Responds to questions appropriately with normal affect.     EKG:  The EKG was personally reviewed and demonstrates: Normal sinus rhythm, heart rate 63, no acute ischemic change or evidence of prior infarction Telemetry:  Telemetry was personally reviewed and demonstrates: Normal sinus rhythm with heart rate in the 70s  Relevant CV Studies:  Echocardiogram 05/04/2020:  1. Left ventricular ejection fraction, by estimation, is 55 to 60%. The  left ventricle has normal function. There is mild left ventricular  hypertrophy. Left ventricular diastolic parameters are consistent with  Grade I diastolic dysfunction (impaired  relaxation).  2. Right ventricular systolic function is normal. The  right ventricular  size is normal. Tricuspid regurgitation signal is inadequate for assessing  PA pressure.  3. Left atrial size was mildly dilated.  4. The mitral valve is normal in structure. No evidence of mitral valve  regurgitation. No evidence of mitral stenosis.  5. The aortic valve was not well visualized. Aortic valve regurgitation  is trivial. No aortic stenosis is present.  6. Technically difficult study with poor acoustic windows.   Carotid artery Doppler ultrasound 05/05/2020:  Summary:  Right Carotid: Velocities in the right ICA are consistent with a 1-39%  stenosis.   Left Carotid: Velocities in the left ICA are consistent with a 1-39%  stenosis.   Laboratory Data:  Chemistry Recent Labs  Lab 05/03/20 1632 05/04/20 0354 05/05/20 0401  NA 139 135 137  K 3.7 3.0* 3.3*  CL 105 102 105  CO2 27 24 26   GLUCOSE 112* 102* 106*  BUN 21 18 18   CREATININE 1.71* 1.51* 1.56*  CALCIUM 8.2* 8.1* 8.3*  GFRNONAA 40* 46* 45*  ANIONGAP 7 9 6     Total Protein  Date Value Ref Range Status  05/03/2020 6.1 (L) 6.5 - 8.1 g/dL Final   Albumin  Date Value Ref Range Status  05/03/2020 3.5 3.5 - 5.0 g/dL Final   AST  Date Value Ref Range Status  05/03/2020 13 (L) 15 - 41 U/L Final   ALT  Date Value Ref Range Status  05/03/2020 <5 0 - 44 U/L Final   Alkaline Phosphatase  Date Value Ref Range Status  05/03/2020 66 38 - 126 U/L Final  Total Bilirubin  Date Value Ref Range Status  05/03/2020 1.0 0.3 - 1.2 mg/dL Final   Hematology Recent Labs  Lab 05/03/20 1308 05/04/20 0354 05/05/20 0401  WBC 1.9* 2.7* 2.4*  RBC 3.53* 3.72* 3.67*  HGB 10.8* 11.4* 11.2*  HCT 31.3* 32.6* 31.4*  MCV 88.7 87.6 85.6  MCH 30.6 30.6 30.5  MCHC 34.5 35.0 35.7  RDW 11.8 11.5 11.6  PLT 105* 115* 101*   Cardiac EnzymesNo results for input(s): TROPONINI in the last 168 hours. No results for input(s): TROPIPOC in the last 168 hours.  BNPNo results for input(s): BNP, PROBNP in the  last 168 hours.  DDimer  Recent Labs  Lab 05/03/20 1632  DDIMER 0.96*   TSH:  Lab Results  Component Value Date   TSH 1.545 07/04/2019   Lipids: Lab Results  Component Value Date   CHOL 139 05/05/2020   HDL 39 (L) 05/05/2020   LDLCALC 90 05/05/2020   LDLDIRECT 114.0 12/30/2016   TRIG 52 05/05/2020   CHOLHDL 3.6 05/05/2020   HgbA1c: Lab Results  Component Value Date   HGBA1C 5.4 05/05/2020    Radiology/Studies:  EEG  Result Date: 05/05/2020 Larry Quest, MD     05/05/2020  9:30 AM Patient Name: [name] MRN: [id number] Epilepsy Attending: Charlsie [name] Referring Physician/Provider: Dr Albertine Grates Date: 05/04/2020 Duration: 23.26 minutes Patient history: 79 year old male with episodes of syncope x2.  EEG to evaluate for seizures. Level of alertness: Awake, drowsy, sleep, comatose, lethargic AEDs during EEG study: None Technical aspects: This EEG study was done with scalp electrodes positioned according to the 10-20 International system of electrode placement. Electrical activity was acquired at a sampling rate of 500Hz  and reviewed with a high frequency filter of 70Hz  and a low frequency filter of 1Hz . EEG data were recorded continuously and digitally stored. Description: The posterior dominant rhythm consists of 8 Hz activity of moderate voltage (25-35 uV) seen predominantly in posterior head regions, symmetric and reactive to eye opening and eye closing. EEG showed continuous generalized 5 to 6 Hz theta as well as intermittent 2 to 3 Hz delta slowing. Hyperventilation and photic stimulation were not performed.   ABNORMALITY -Continuous slow, generalized IMPRESSION: This study is suggestive of mild diffuse encephalopathy, nonspecific etiology. No seizures or epileptiform discharges were seen throughout the recording.   DG Chest 2 View  Result Date: 05/03/2020 CLINICAL DATA:  79 year old male with a history of syncope EXAM: CHEST - 2 VIEW COMPARISON:   07/03/2019 FINDINGS: Cardiomediastinal silhouette likely unchanged in size and contour given the positioning. No pneumothorax. No pleural effusion. Coarsened interstitial markings bilateral lungs without confluent airspace disease. No acute displaced fracture. Degenerative changes of the visualized spine. IMPRESSION: Negative for acute cardiopulmonary disease Electronically Signed   By: 13/03/2020 D.O.   On: 05/03/2020 13:50   CT Head Wo Contrast  Result Date: 05/03/2020 CLINICAL DATA:  Mental status change, unknown cause. EXAM: CT HEAD WITHOUT CONTRAST TECHNIQUE: Contiguous axial images were obtained from the base of the skull through the vertex without intravenous contrast. COMPARISON:  Head CT 02/16/2019.  Brain MRI 11/23/2018. FINDINGS: Brain: Moderate cerebral atrophy. Moderate ill-defined hypoattenuation within the cerebral white matter is nonspecific, but compatible with chronic small vessel ischemic disease. A small infarct is questioned within the posterior left cerebellar hemisphere, new from the prior head CT of 02/16/2019 but otherwise age indeterminate (series 2, image 9) (series 5, image 56). There is no acute intracranial hemorrhage. No demarcated  cortical infarct. No extra-axial fluid collection. No evidence of intracranial mass. No midline shift. Vascular: No hyperdense vessel.  Atherosclerotic calcifications. Skull: Normal. Negative for fracture or focal lesion. Sinuses/Orbits: Visualized orbits show no acute finding. Mild ethmoid and maxillary sinus mucosal thickening. IMPRESSION: A small infarct is questioned within the posterior left cerebellar hemisphere, new from the prior head CT of 02/16/2019 but otherwise age indeterminate. Moderate cerebral atrophy and chronic small vessel ischemic disease. Mild ethmoid and maxillary sinus mucosal thickening. Electronically Signed   By: Jackey Loge DO   On: 05/03/2020 19:26   VAS US CAROTID  Result Date: 05/05/2020 Carotid Arterial Duplex  Study Indications:       CVA. Risk Factors:      Hypertension. Limitations        Today's exam was limited due to the patient's respiratory                    variation. Comparison Study:  No prior studies. Performing Technologist: Chanda Busing RVT  Examination Guidelines: A complete evaluation includes B-mode imaging, spectral Doppler, color Doppler, and power Doppler as needed of all accessible portions of each vessel. Bilateral testing is considered an integral part of a complete examination. Limited examinations for reoccurring indications may be performed as noted.  Right Carotid Findings: +----------+--------+--------+--------+-----------------------+--------+           PSV cm/sEDV cm/sStenosisPlaque Description     Comments +----------+--------+--------+--------+-----------------------+--------+ CCA Prox  111     14              smooth and heterogenous         +----------+--------+--------+--------+-----------------------+--------+ CCA Distal78      14              smooth and heterogenous         +----------+--------+--------+--------+-----------------------+--------+ ICA Prox  97      16              smooth and heterogenous         +----------+--------+--------+--------+-----------------------+--------+ ICA Distal51      14                                     tortuous +----------+--------+--------+--------+-----------------------+--------+ ECA       224     26                                              +----------+--------+--------+--------+-----------------------+--------+ +----------+--------+-------+--------+-------------------+           PSV cm/sEDV cmsDescribeArm Pressure (mmHG) +----------+--------+-------+--------+-------------------+ [id number]                                        +----------+--------+-------+--------+-------------------+ +---------+--------+--+--------+--+---------+ VertebralPSV cm/s59EDV cm/s12Antegrade  +---------+--------+--+--------+--+---------+  Left Carotid Findings: +----------+--------+--------+--------+-----------------------+--------+           PSV cm/sEDV cm/sStenosisPlaque Description     Comments +----------+--------+--------+--------+-----------------------+--------+ CCA Prox  107     14              smooth and heterogenous         +----------+--------+--------+--------+-----------------------+--------+ CCA Distal111     14              smooth and heterogenous         +----------+--------+--------+--------+-----------------------+--------+  ICA Prox  133     21              smooth and heterogenous         +----------+--------+--------+--------+-----------------------+--------+ ICA Distal88      24                                     tortuous +----------+--------+--------+--------+-----------------------+--------+ ECA       107     9                                               +----------+--------+--------+--------+-----------------------+--------+ +----------+--------+--------+--------+-------------------+           PSV cm/sEDV cm/sDescribeArm Pressure (mmHG) +----------+--------+--------+--------+-------------------+ Subclavian200                                         +----------+--------+--------+--------+-------------------+ +---------+--------+--+--------+--+---------+ VertebralPSV cm/s57EDV cm/s12Antegrade +---------+--------+--+--------+--+---------+   Summary: Right Carotid: Velocities in the right ICA are consistent with a 1-39% stenosis. Left Carotid: Velocities in the left ICA are consistent with a 1-39% stenosis. Vertebrals: Bilateral vertebral arteries demonstrate antegrade flow. *See table(s) above for measurements and observations.     Preliminary    ECHOCARDIOGRAM LIMITED  Result Date: 05/04/2020    ECHOCARDIOGRAM LIMITED REPORT   Patient Name:   [name] Date of Exam: 05/04/2020 Medical Rec #:  [id number]       Height:       68.0 in Accession #:    [id number]     Weight:       170.9 lb Date of Birth:  04/04/1941       BSA:          1.911 m Patient Age:    79 years       BP:           180/84 mmHg Patient Gender: M              HR:           70 bpm. Exam Location:  Inpatient Procedure: Limited Echo, Cardiac Doppler and Color Doppler Indications:    Syncope  History:        Patient has no prior history of Echocardiogram examinations.                 COPD, Signs/Symptoms:Syncope; Risk Factors:Hypertension,                 Dyslipidemia and Former Smoker. Parkinsons Dementia, COVID+.  Sonographer:    Lavenia Atlas Referring Phys: Albertine Grates  Sonographer Comments: Technically challenging study due to limited acoustic windows. Image acquisition challenging due to COPD. IMPRESSIONS  1. Left ventricular ejection fraction, by estimation, is 55 to 60%. The left ventricle has normal function. There is mild left ventricular hypertrophy. Left ventricular diastolic parameters are consistent with Grade I diastolic dysfunction (impaired relaxation).  2. Right ventricular systolic function is normal. The right ventricular size is normal. Tricuspid regurgitation signal is inadequate for assessing PA pressure.  3. Left atrial size was mildly dilated.  4. The mitral valve is normal in structure. No evidence of mitral valve regurgitation. No evidence of mitral stenosis.  5. The aortic valve was not well visualized.  Aortic valve regurgitation is trivial. No aortic stenosis is present.  6. Technically difficult study with poor acoustic windows. FINDINGS  Left Ventricle: Left ventricular ejection fraction, by estimation, is 55 to 60%. The left ventricle has normal function. The left ventricular internal cavity size was normal in size. There is mild left ventricular hypertrophy. Left ventricular diastolic  parameters are consistent with Grade I diastolic dysfunction (impaired relaxation). Right Ventricle: The right ventricular size is normal. No  increase in right ventricular wall thickness. Right ventricular systolic function is normal. Tricuspid regurgitation signal is inadequate for assessing PA pressure. Left Atrium: Left atrial size was mildly dilated. Right Atrium: Right atrial size was normal in size. Mitral Valve: The mitral valve is normal in structure. No evidence of mitral valve stenosis. Tricuspid Valve: The tricuspid valve is normal in structure. Tricuspid valve regurgitation is not demonstrated. Aortic Valve: The aortic valve was not well visualized. Aortic valve regurgitation is trivial. No aortic stenosis is present. Pulmonic Valve: The pulmonic valve was not well visualized. Aorta: The aortic root is normal in size and structure. Venous: The inferior vena cava was not well visualized. IAS/Shunts: No atrial level shunt detected by color flow Doppler. LEFT VENTRICLE PLAX 2D LVIDd:         3.70 cm Diastology LVIDs:         2.60 cm LV e' medial:    4.79 cm/s LV PW:         1.40 cm LV E/e' medial:  9.3 LV IVS:        1.20 cm LV e' lateral:   5.77 cm/s                        LV E/e' lateral: 7.7  RIGHT VENTRICLE RV S prime:     7.07 cm/s MITRAL VALVE MV Area (PHT): 2.22 cm MV Decel Time: 342 msec MV E velocity: 44.60 cm/s MV A velocity: 62.60 cm/s MV E/A ratio:  0.71 Marca Ancona MD Electronically signed by Marca Ancona MD Signature Date/Time: 05/04/2020/3:45:20 PM    Final    Assessment and Plan:   1.  Syncope: -Reportedly had a syncopal event overnight from 05/04/2020 to 05/05/2020.  This was at [facility].  He was sent over without any capturing of what happened on telemetry. Unfortunately, Mr. Revard has dementia and Parkinson's disease.  He is unable to tell us about the event that happened overnight.  -I did discuss with hospital medicine about the event in question overnight.  Apparently there was concerns for ventricular tachycardia versus artifact.  Given that his EKG and echocardiogram are normal, ventricular  tachycardia is unlikely.  I suspect this was either a vasovagal event or neurogenic orthostatic hypotension event in the setting of Parkinson's disease.  He is also had a recent cerebellar stroke.  Neurology has plans to work him up for this with MRI.  Cerebellar strokes cause dizziness.  Further work-up and guidance per neuro. -I would recommend to continue to monitor on telemetry.  But no other recommendations will be made.  He is resting quite comfortably in the room without any significant shortness of breath or chest pain.  [facility] will sign off.   Medication Recommendations: Conservative management as you are doing.  Maintain on telemetry. Other recommendations (labs, testing, etc): None Follow up as an outpatient: None needed.  Please notify us if he has another event and we can review his telemetry.  Signed, Lenna Gilford. Flora Lipps, MD [facility]  [facility]  05/05/2020 5:24 PM   For questions or updates, please contact [facility] Please consult [email or web address] for contact info under Cardiology/STEMI.

## 2020-05-05 NOTE — Progress Notes (Signed)
PROGRESS NOTE  MONTRAIL MEHRER  ZOX:[id number] DOB: [birth year] DOA: 05/03/2020 PCP: Larry Garter, MD  Outpatient Specialists: Neurology, [name] Brief Narrative: [name] is an 79 y.o. male with a history of Parkinson's disease, dementia, increasing falls at home, covid-19 vaccinations, and BPH who presented to the ED from home after 2 episodes of loss of consciousness. He has had altered mental status since arrival, so history is limited. In the ED he was hypertensive to 169/91, afebrile without respiratory distress or hypoxia. Pancytopenia (ANC 1.1k, plt 105k, hgb 10.8g/dl) and hypokalemia were noted on labs. No bacteriuria on UA or infiltrate on CXR, though screening SARS-CoV-2 PCR was positive. Subsequent inflammatory markers have been negative. He was admitted for syncope evaluation with work up pending including CT head, echo, and EEG. An age-indeterminate infarct of the posterior left cerebellum was noted on CT for which MRI is ordered.   Assessment & Plan: Active Problems:   Hypokalemia   Falls frequently   Dementia due to Parkinson's disease without behavioral disturbance (HCC)   Syncope   Neutropenia (HCC)   Thrombocytopenia (HCC)   COVID-19 virus infection   AMS (altered mental status)  Left posterior cerebellum infarct: Age-indeterminate.  - MRI brain, adding MRA per neurology request. I spoke with Larry Reamer, NP. Neurology requests transfer for stroke team availability at [facility]. - Carotid U/S with 1-39% stenosis bilaterally - LDL 90, HDL 39; hx leg cramps with atorvastatin. In light of infarct, could trial rosuvastatin.  - Permissive HTN for now - Echocardiogram without cardioembolic source.  - Give aspirin pending formal neurology evaluation. - Continue telemetry  - PT, OT ordered. SNF recommended. TOC consulted.   Altered mental status on chronic dementia: Unclear chronicity and etiology. Could be attributable to delirium, CVA, seizure, or  Parkinson's-related.  - Delirium precautions.   - EEG with nonspecific slowing, no epileptiform discharges. - Appreciate neurology assessment.  Syncope: Had recurrent episode while admitted and on telemetry on 11/12. This demonstrated no seizure-like activity or postictal period.  - Continue telemetry. Would appreciate cardiology input regarding abnormalities during syncopal period on 11/12 AM. No significant valvular or structural heart disease on limited echo. - ?if related to autonomic dysfunction related to PD. - Continue IV hydration with minimal po intake today. Monitor orthostatic VS's  Covid-19 infection: Incidental on admission screening PCR in vaccinated patient [employer] 3/25, 4/19) with no infiltrate on CXR, no inflammatory marker elevation, and no infectious or pulmonary symptoms. Received bamlanivimab/etesevimab 11/11.  Folic acid deficiency:  - Start supplementation.   Pancytopenia: Likely related to covid and anemia of chronic disease. - Continue monitoring.  - Blood culture pending.  Stage IIIa CKD: UA with no proteinuria, but with hyaline casts.  - Hold torsemide for now - Avoid nephrotoxins.   Parkinson's disease: Followed by [name].  - Continue sinemet, rivastigmine  Hypokalemia:  - Continue supplementation in IVF.  BPH:  - With severe hypotension-related to syncope, possible autonomic dysregulation with PD, will stop cardura for now. Consider alternative agents and/or nocturnal dosing.  DVT prophylaxis: Lovenox Code Status: DNR Family Communication: Speaking with wife by phone daily. Disposition Plan:  Status is: Inpatient  Remains inpatient appropriate because:Altered mental status, Ongoing diagnostic testing needed not appropriate for outpatient work up and Inpatient level of care appropriate due to severity of illness  Dispo: The patient is from: Home              Anticipated d/c is to: SNF  Anticipated d/c date is: 1 day               Patient currently is not medically stable to d/c.  Consultants:   Neurology  Cardiology  Procedures:  Echocardiogram 11/11:  1. Left ventricular ejection fraction, by estimation, is 55 to 60%. The  left ventricle has normal function. There is mild left ventricular  hypertrophy. Left ventricular diastolic parameters are consistent with  Grade I diastolic dysfunction (impaired  relaxation).  2. Right ventricular systolic function is normal. The right ventricular  size is normal. Tricuspid regurgitation signal is inadequate for assessing  PA pressure.  3. Left atrial size was mildly dilated.  4. The mitral valve is normal in structure. No evidence of mitral valve  regurgitation. No evidence of mitral stenosis.  5. The aortic valve was not well visualized. Aortic valve regurgitation  is trivial. No aortic stenosis is present.  6. Technically difficult study with poor acoustic windows.   EEG 11/12:  - Continuous slow, generalized - This study is suggestive of mild diffuse encephalopathy, nonspecific etiology. No seizures or epileptiform discharges were seen throughout the recording.  Antimicrobials:  None   Subjective: Had an episode of unresponsiveness accompanied by hypotension without loss of pulse while sitting in bedside chair getting orthostatic vitals taken. He has loss of postural tone globally and amnesia for the event. He was not urinating/defecating, straining, standing, and has had no vomiting/diarrhea. Staff moved pt to be, put in trendelenburg and called code blue which was promptly cancelled. Pt spontaneously become responsive on my arrival 2 minutes later and was at his mental baseline without lethargy/postictal appearance. BP has returned to normotensive promptly. He denies pain or dyspnea, numbness or weakness.   Objective: Vitals:   05/04/20 1337 05/04/20 2119 05/05/20 0518 05/05/20 1245  BP: (!) 180/84 (!) 175/103 (!) 184/85 140/64  Pulse: 70 97 77 (!) 56    Resp: Temp: 98.4 F (36.9 C) 97.9 F (36.6 C) 98.8 F (37.1 C)   TempSrc:  Oral Oral   SpO2: 99% 95% 95% 98%  Weight:      Height:        Intake/Output Summary (Last 24 hours) at 05/05/2020 1349 Last data filed at 05/05/2020 1000 Gross per 24 hour  Intake 1391.51 ml  Output 1300 ml  Net 91.51 ml   Filed Weights   05/04/20 0000  Weight: 77.5 kg   Gen: 79 y.o. male in no distress Pulm: Nonlabored breathing room air. Clear. CV: Regular rate and rhythm, between 7-80 bpm. No murmur, rub, or gallop. No JVD, no dependent edema. GI: Abdomen soft, non-tender, non-distended, with normoactive bowel sounds.  Ext: Warm, no deformities Skin: No rashes, lesions or ulcers on visualized skin. Neuro: Alert and disoriented, fully responsive with no changes in neurological exam from yesterday. Psych: Judgement and insight appear impaired.    Telemetry: Reviewed personally this after noon showing abnormalities at 11:42am which coincide with syncopal episode. Artifact may be contributing, though there appear to be erratic wide complexes.  Data Reviewed: I have personally reviewed following labs and imaging studies  CBC: Recent Labs  Lab 05/03/20 1308 05/04/20 0354 05/05/20 0401  WBC 1.9* 2.7* 2.4*  NEUTROABS 1.1* 1.4* 1.5*  HGB 10.8* 11.4* 11.2*  HCT 31.3* 32.6* 31.4*  MCV 88.7 87.6 85.6  PLT 105* 115* 101*   Basic Metabolic Panel: Recent Labs  Lab 05/03/20 1308 05/03/20 1632 05/04/20 0354 05/05/20 0401  NA 141 139 135 137  K 3.3* 3.7 3.0* 3.3*  CL 110 105 102 105  CO2 24 27 24 26   GLUCOSE 108* 112* 102* 106*  BUN 20 21 18 18   CREATININE 1.48* 1.71* 1.51* 1.56*  CALCIUM 6.8* 8.2* 8.1* 8.3*  MG  --   --  1.7 2.0   GFR: Estimated Creatinine Clearance: 36.5 mL/min (A) (by C-G formula based on SCr of 1.56 mg/dL (H)). Liver Function Tests: Recent Labs  Lab 05/03/20 1308 05/03/20 1632  AST 12* 13*  ALT <5 <5  ALKPHOS 52 66  BILITOT 0.9 1.0  PROT 4.8* 6.1*   ALBUMIN 2.7* 3.5   No results for input(s): LIPASE, AMYLASE in the last 168 hours. No results for input(s): AMMONIA in the last 168 hours. Coagulation Profile: No results for input(s): INR, PROTIME in the last 168 hours. Cardiac Enzymes: No results for input(s): CKTOTAL, CKMB, CKMBINDEX, TROPONINI in the last 168 hours. BNP (last 3 results) No results for input(s): PROBNP in the last 8760 hours. HbA1C: Recent Labs    05/05/20 0401  HGBA1C 5.4   CBG: No results for input(s): GLUCAP in the last 168 hours. Lipid Profile: Recent Labs    05/03/20 1632 05/05/20 0401  CHOL  --  139  HDL  --  39*  LDLCALC  --  90  TRIG 62 52  CHOLHDL  --  3.6   Thyroid Function Tests: No results for input(s): TSH, T4TOTAL, FREET4, T3FREE, THYROIDAB in the last 72 hours. Anemia Panel: Recent Labs    05/03/20 1632 05/05/20 0401  VITAMINB12  --  2,792*  FOLATE  --  5.8*  FERRITIN 305  --    Urine analysis:    Component Value Date/Time   COLORURINE STRAW (A) 05/03/2020 1440   APPEARANCEUR CLEAR 05/03/2020 1440   LABSPEC 1.005 05/03/2020 1440   PHURINE 7.0 05/03/2020 1440   GLUCOSEU NEGATIVE 05/03/2020 1440   GLUCOSEU NEGATIVE 08/04/2018 0949   HGBUR SMALL (A) 05/03/2020 1440   BILIRUBINUR NEGATIVE 05/03/2020 1440   KETONESUR NEGATIVE 05/03/2020 1440   PROTEINUR NEGATIVE 05/03/2020 1440   UROBILINOGEN 1.0 08/04/2018 0949   NITRITE NEGATIVE 05/03/2020 1440   LEUKOCYTESUR NEGATIVE 05/03/2020 1440   Recent Results (from the past 240 hour(s))  Respiratory Panel by RT PCR (Flu A&B, Covid) - Nasopharyngeal Swab     Status: Abnormal   Collection Time: 05/03/20  1:13 PM   Specimen: Nasopharyngeal Swab  Result Value Ref Range Status   SARS Coronavirus 2 by RT PCR POSITIVE (A) NEGATIVE Final    Comment: RESULT CALLED TO, READ BACK BY AND VERIFIED WITH: FRANKLIN,C. RN @1510  05/03/20 BILLINGSLEY,L    Influenza A by PCR NEGATIVE NEGATIVE Final   Influenza B by PCR NEGATIVE NEGATIVE Final     Comment: Performed at [name], [address] [place], [place]  Culture, blood (single)     Status: None (Preliminary result)   Collection Time: 05/03/20  4:32 PM   Specimen: BLOOD RIGHT FOREARM  Result Value Ref Range Status   Specimen Description   Final    BLOOD RIGHT FOREARM Performed at [facility] [address], [place], [place]    Special Requests   Final    BOTTLES DRAWN AEROBIC AND ANAEROBIC Blood Culture adequate volume Performed at [facility], [address], [place], [place]    Culture   Final    NO GROWTH 2 DAYS Performed at [facility], [address],  [place], [place] [address]    Report Status PENDING  Incomplete      Radiology Studies: EEG  Result Date: 05/05/2020 Charlsie Quest, MD     05/05/2020  9:30 AM Patient Name: [name] MRN: [id number] Epilepsy Attending: Charlsie Quest Referring Physician/Provider: Dr Albertine Grates Date: 05/04/2020 Duration: 23.26 minutes Patient history: 79 year old male with episodes of syncope x2.  EEG to evaluate for seizures. Level of alertness: Awake, drowsy, sleep, comatose, lethargic AEDs during EEG study: None Technical aspects: This EEG study was done with scalp electrodes positioned according to the 10-20 International system of electrode placement. Electrical activity was acquired at a sampling rate of 500Hz  and reviewed with a high frequency filter of 70Hz  and a low frequency filter of 1Hz . EEG data were recorded continuously and digitally stored. Description: The posterior dominant rhythm consists of 8 Hz activity of moderate voltage (25-35 uV) seen predominantly in posterior head regions, symmetric and reactive to eye opening and eye closing. EEG showed continuous generalized 5 to 6 Hz theta as well as intermittent 2 to 3 Hz delta slowing. Hyperventilation and photic stimulation were not performed.   ABNORMALITY -Continuous  slow, generalized IMPRESSION: This study is suggestive of mild diffuse encephalopathy, nonspecific etiology. No seizures or epileptiform discharges were seen throughout the recording.   CT Head Wo Contrast  Result Date: 05/03/2020 CLINICAL DATA:  Mental status change, unknown cause. EXAM: CT HEAD WITHOUT CONTRAST TECHNIQUE: Contiguous axial images were obtained from the base of the skull through the vertex without intravenous contrast. COMPARISON:  Head CT 02/16/2019.  Brain MRI 11/23/2018. FINDINGS: Brain: Moderate cerebral atrophy. Moderate ill-defined hypoattenuation within the cerebral white matter is nonspecific, but compatible with chronic small vessel ischemic disease. A small infarct is questioned within the posterior left cerebellar hemisphere, new from the prior head CT of 02/16/2019 but otherwise age indeterminate (series 2, image 9) (series 5, image 56). There is no acute intracranial hemorrhage. No demarcated cortical infarct. No extra-axial fluid collection. No evidence of intracranial mass. No midline shift. Vascular: No hyperdense vessel.  Atherosclerotic calcifications. Skull: Normal. Negative for fracture or focal lesion. Sinuses/Orbits: Visualized orbits show no acute finding. Mild ethmoid and maxillary sinus mucosal thickening. IMPRESSION: A small infarct is questioned within the posterior left cerebellar hemisphere, new from the prior head CT of 02/16/2019 but otherwise age indeterminate. Moderate cerebral atrophy and chronic small vessel ischemic disease. Mild ethmoid and maxillary sinus mucosal thickening. Electronically Signed   By: 01/23/2019 DO   On: 05/03/2020 19:26   VAS 02/18/2019 CAROTID  Result Date: 05/05/2020 Carotid Arterial Duplex Study Indications:       CVA. Risk Factors:      Hypertension. Limitations        Today's exam was limited due to the patient's respiratory                    variation. Comparison Study:  No prior studies. Performing Technologist:  13/03/2020 RVT  Examination Guidelines: A complete evaluation includes B-mode imaging, spectral Doppler, color Doppler, and power Doppler as needed of all accessible portions of each vessel. Bilateral testing is considered an integral part of a complete examination. Limited examinations for reoccurring indications may be performed as noted.  Right Carotid Findings: +----------+--------+--------+--------+-----------------------+--------+           PSV cm/sEDV cm/sStenosisPlaque Description     Comments +----------+--------+--------+--------+-----------------------+--------+ CCA Prox  111     14  smooth and heterogenous         +----------+--------+--------+--------+-----------------------+--------+ CCA Distal78      14              smooth and heterogenous         +----------+--------+--------+--------+-----------------------+--------+ ICA Prox  97      16              smooth and heterogenous         +----------+--------+--------+--------+-----------------------+--------+ ICA Distal51      14                                     tortuous +----------+--------+--------+--------+-----------------------+--------+ ECA       224     26                                              +----------+--------+--------+--------+-----------------------+--------+ +----------+--------+-------+--------+-------------------+           PSV cm/sEDV cmsDescribeArm Pressure (mmHG) +----------+--------+-------+--------+-------------------+ [id number]                                        +----------+--------+-------+--------+-------------------+ +---------+--------+--+--------+--+---------+ VertebralPSV cm/s59EDV cm/s12Antegrade +---------+--------+--+--------+--+---------+  Left Carotid Findings: +----------+--------+--------+--------+-----------------------+--------+           PSV cm/sEDV cm/sStenosisPlaque Description     Comments  +----------+--------+--------+--------+-----------------------+--------+ CCA Prox  107     14              smooth and heterogenous         +----------+--------+--------+--------+-----------------------+--------+ CCA Distal111     14              smooth and heterogenous         +----------+--------+--------+--------+-----------------------+--------+ ICA Prox  133     21              smooth and heterogenous         +----------+--------+--------+--------+-----------------------+--------+ ICA Distal88      24                                     tortuous +----------+--------+--------+--------+-----------------------+--------+ ECA       107     9                                               +----------+--------+--------+--------+-----------------------+--------+ +----------+--------+--------+--------+-------------------+           PSV cm/sEDV cm/sDescribeArm Pressure (mmHG) +----------+--------+--------+--------+-------------------+ Subclavian200                                         +----------+--------+--------+--------+-------------------+ +---------+--------+--+--------+--+---------+ VertebralPSV cm/s57EDV cm/s12Antegrade +---------+--------+--+--------+--+---------+   Summary: Right Carotid: Velocities in the right ICA are consistent with a 1-39% stenosis. Left Carotid: Velocities in the left ICA are consistent with a 1-39% stenosis. Vertebrals: Bilateral vertebral arteries demonstrate antegrade flow. *See table(s) above for measurements and observations.     Preliminary    ECHOCARDIOGRAM LIMITED  Result Date:  05/04/2020    ECHOCARDIOGRAM LIMITED REPORT   Patient Name:   [name] Date of Exam: 05/04/2020 Medical Rec #:  [id number]      Height:       68.0 in Accession #:    [id number]     Weight:       170.9 lb Date of Birth:  03/31/1941       BSA:          1.911 m Patient Age:    79 years       BP:           180/84 mmHg Patient Gender: M               HR:           70 bpm. Exam Location:  Inpatient Procedure: Limited Echo, Cardiac Doppler and Color Doppler Indications:    Syncope  History:        Patient has no prior history of Echocardiogram examinations.                 COPD, Signs/Symptoms:Syncope; Risk Factors:Hypertension,                 Dyslipidemia and Former Smoker. Parkinsons Dementia, COVID+.  Sonographer:    Lavenia AtlasBrooke Strickland Referring Phys: Albertine GratesXU, FANG  Sonographer Comments: Technically challenging study due to limited acoustic windows. Image acquisition challenging due to COPD. IMPRESSIONS  1. Left ventricular ejection fraction, by estimation, is 55 to 60%. The left ventricle has normal function. There is mild left ventricular hypertrophy. Left ventricular diastolic parameters are consistent with Grade I diastolic dysfunction (impaired relaxation).  2. Right ventricular systolic function is normal. The right ventricular size is normal. Tricuspid regurgitation signal is inadequate for assessing PA pressure.  3. Left atrial size was mildly dilated.  4. The mitral valve is normal in structure. No evidence of mitral valve regurgitation. No evidence of mitral stenosis.  5. The aortic valve was not well visualized. Aortic valve regurgitation is trivial. No aortic stenosis is present.  6. Technically difficult study with poor acoustic windows. FINDINGS  Left Ventricle: Left ventricular ejection fraction, by estimation, is 55 to 60%. The left ventricle has normal function. The left ventricular internal cavity size was normal in size. There is mild left ventricular hypertrophy. Left ventricular diastolic  parameters are consistent with Grade I diastolic dysfunction (impaired relaxation). Right Ventricle: The right ventricular size is normal. No increase in right ventricular wall thickness. Right ventricular systolic function is normal. Tricuspid regurgitation signal is inadequate for assessing PA pressure. Left Atrium: Left atrial size was mildly dilated. Right  Atrium: Right atrial size was normal in size. Mitral Valve: The mitral valve is normal in structure. No evidence of mitral valve stenosis. Tricuspid Valve: The tricuspid valve is normal in structure. Tricuspid valve regurgitation is not demonstrated. Aortic Valve: The aortic valve was not well visualized. Aortic valve regurgitation is trivial. No aortic stenosis is present. Pulmonic Valve: The pulmonic valve was not well visualized. Aorta: The aortic root is normal in size and structure. Venous: The inferior vena cava was not well visualized. IAS/Shunts: No atrial level shunt detected by color flow Doppler. LEFT VENTRICLE PLAX 2D LVIDd:         3.70 cm Diastology LVIDs:         2.60 cm LV e' medial:    4.79 cm/s LV PW:         1.40 cm LV E/e' medial:  9.3 LV IVS:  1.20 cm LV e' lateral:   5.77 cm/s                        LV E/e' lateral: 7.7  RIGHT VENTRICLE RV S prime:     7.07 cm/s MITRAL VALVE MV Area (PHT): 2.22 cm MV Decel Time: 342 msec MV E velocity: 44.60 cm/s MV A velocity: 62.60 cm/s MV E/A ratio:  0.71 Marca Ancona MD Electronically signed by Marca Ancona MD Signature Date/Time: 05/04/2020/3:45:20 PM    Final     Scheduled Meds: . aspirin  325 mg Oral Daily  . B-complex with vitamin C  1 tablet Oral Daily  . carbidopa-levodopa  1 tablet Oral TID WC  . cholecalciferol  1,000 Units Oral Daily  . doxazosin  2 mg Oral Daily  . enoxaparin (LOVENOX) injection  40 mg Subcutaneous Q24H  . rivastigmine  1.5 mg Oral BID  . traZODone  100 mg Oral QHS   Continuous Infusions: . sodium chloride    . 0.9 % NaCl with KCl 20 mEq / L 75 mL/hr at 05/05/20 1004  . famotidine (PEPCID) IV       LOS: 1 day   Time spent: 35 minutes.  Tyrone Nine, MD [facility] [email or web address] 05/05/2020, 1:49 PM

## 2020-05-05 NOTE — Progress Notes (Addendum)
Patient had just finished his PT session and was placed in the recliner. Orthostatic test done while pt. was in sitting position. BP 76/43 Pulse -68 O2 Saturation was 98%. Patient is hard to aroused, MD was called and came to bedside. Pt. was returned back to bed and placed in Trendelenburg position. Vital sign reassessment  shows BP 142/75 Pulse 68. Pt. Is responsive and alert to self. Patient is on closely monitored.   @ 1430 patient is ordered for transfer to Neurology Unit - [facility]. Report called to [employer]. Admitting RN was asked to make sure MRI will be done ASAP and to update wife once patient is [facility]. Patient awaiting for transport.

## 2020-05-05 NOTE — Progress Notes (Signed)
PT Cancellation Note  Patient Details Name: [name] MRN: [id number] DOB: December 14, 1940   Cancelled Treatment:    Reason Eval/Treat Not Completed: Medical issues which prohibited therapy. Rapid response called at this time. PT deferred.    [facility] 05/05/2020, 11:49 AM

## 2020-05-05 NOTE — Progress Notes (Signed)
Pt transferred from [facility] to the unit for neurology consult. Pt transported by [facility]  via stretcher. Pt is admitted to negative pressure room due to positive COVID diagnosis. Pt is alert and oriented to person and place. Pt is not oriented to time or situation. Pt VS are WNL. Pt O2 sats 99% RA with no signs or symptoms of respiratory distress noted at this time. HOB elevated above 30 degrees. Pt has no complaints of pain or discomfort. Pt bed alarm set and bed in lowest position. Call button is within reach of patient and he in encouraged to call for assistance. Staff will continue to monitor.

## 2020-05-06 ENCOUNTER — Encounter (HOSPITAL_COMMUNITY): Payer: Self-pay | Admitting: Family Medicine

## 2020-05-06 ENCOUNTER — Inpatient Hospital Stay (HOSPITAL_COMMUNITY): Payer: Medicare Other

## 2020-05-06 DIAGNOSIS — G9340 Encephalopathy, unspecified: Secondary | ICD-10-CM

## 2020-05-06 LAB — BASIC METABOLIC PANEL
Anion gap: 8 (ref 5–15)
BUN: 18 mg/dL (ref 8–23)
CO2: 22 mmol/L (ref 22–32)
Calcium: 8 mg/dL — ABNORMAL LOW (ref 8.9–10.3)
Chloride: 107 mmol/L (ref 98–111)
Creatinine, Ser: 1.57 mg/dL — ABNORMAL HIGH (ref 0.61–1.24)
GFR, Estimated: 44 mL/min — ABNORMAL LOW (ref >60)
Glucose, Bld: 93 mg/dL (ref 70–99)
Potassium: 3.5 mmol/L (ref 3.5–5.1)
Sodium: 137 mmol/L (ref 135–145)

## 2020-05-06 LAB — CBC
HCT: 29.9 % — ABNORMAL LOW (ref 39.0–52.0)
Hemoglobin: 10.6 g/dL — ABNORMAL LOW (ref 13.0–17.0)
MCH: 30.2 pg (ref 26.0–34.0)
MCHC: 35.5 g/dL (ref 30.0–36.0)
MCV: 85.2 fL (ref 80.0–100.0)
Platelets: 86 10*3/uL — ABNORMAL LOW (ref 150–400)
RBC: 3.51 MIL/uL — ABNORMAL LOW (ref 4.22–5.81)
RDW: 11.6 % (ref 11.5–15.5)
WBC: 2.6 10*3/uL — ABNORMAL LOW (ref 4.0–10.5)
nRBC: 0 % (ref 0.0–0.2)

## 2020-05-06 MED ORDER — ROSUVASTATIN CALCIUM 5 MG PO TABS
10.0000 mg | ORAL_TABLET | Freq: Every day | ORAL | Status: DC
  Administered 2020-05-06 – 2020-05-09 (×4): 10 mg via ORAL
  Filled 2020-05-06 (×4): qty 2

## 2020-05-06 MED ORDER — VALACYCLOVIR HCL 500 MG PO TABS
1000.0000 mg | ORAL_TABLET | Freq: Two times a day (BID) | ORAL | Status: DC
  Administered 2020-05-06 – 2020-05-09 (×7): 1000 mg via ORAL
  Filled 2020-05-06 (×7): qty 2

## 2020-05-06 MED ORDER — HALOPERIDOL LACTATE 5 MG/ML IJ SOLN
2.0000 mg | Freq: Once | INTRAMUSCULAR | Status: AC | PRN
  Administered 2020-05-06: 2 mg via INTRAVENOUS
  Filled 2020-05-06: qty 1

## 2020-05-06 MED ORDER — LORAZEPAM 2 MG/ML IJ SOLN
1.0000 mg | Freq: Once | INTRAMUSCULAR | Status: AC | PRN
  Administered 2020-05-06: 1 mg via INTRAVENOUS
  Filled 2020-05-06: qty 1

## 2020-05-06 MED ORDER — POTASSIUM CHLORIDE 20 MEQ PO PACK
20.0000 meq | PACK | Freq: Once | ORAL | Status: AC
  Administered 2020-05-06: 20 meq via ORAL
  Filled 2020-05-06: qty 1

## 2020-05-06 NOTE — Progress Notes (Signed)
PROGRESS NOTE                                                                                                                                                                                                             Patient Demographics:    [name], is a 79 y.o. male, DOB - 27-Sep-1940, ZOX:[id number]  Outpatient Primary MD for the patient is Plotnikov, Georgina Quint, MD    LOS - 2  Admit date - 05/03/2020    Chief Complaint  Patient presents with   Altered Mental Status       Brief Narrative (HPI from H&P) - [name] is an 79 y.o. male with a history of Parkinson's disease, dementia, increasing falls at home, covid-19 vaccinations, and BPH who presented to the ED from home after 2 episodes of loss of consciousness. He has had altered mental status since arrival, so history is limited. In the ED he was hypertensive to 169/91, afebrile without respiratory distress or hypoxia. Pancytopenia (ANC 1.1k, plt 105k, hgb 10.8g/dl) and hypokalemia were noted on labs. No bacteriuria on UA or infiltrate on CXR, though screening SARS-CoV-2 PCR was positive. Subsequent inflammatory markers have been negative. He was admitted for syncope evaluation with work up pending including CT head, echo, and EEG. An age-indeterminate infarct of the posterior left cerebellum was noted on CT for which MRI is ordered.    Subjective:    [name] today has, No headache, No chest pain, No abdominal pain - No Nausea, No new weakness tingling or numbness, no shortness of breath.   Assessment  & Plan :   Left posterior cerebellum infarct: Age-indeterminate - age unclear, MRI MRI brain pending, neuro team consulted and at about to see the patient, echo and carotid stable, LDL around 90 for which trial of Crestor will be done VS allergy with atorvastatin, continue aspirin.  Continue telemetry monitor.  PT OT and further work-up per neurology. Case  discussed with Dr. Thomasena Edis on 05/06/2020.    Altered mental status on chronic dementia, history of Parkinson's: Likely metabolic encephalopathy, ? CVA, seizure, or Parkinson's-related.  EEG stable, currently mentation has improved now following commands, continue supportive care.  Avoid benzodiazepines and narcotics.  Haldol as needed if needed for delirium.   Syncope: Had recurrent episode while admitted and on telemetry on 11/12. This demonstrated no  seizure-like activity or postictal period.  Echo and EEG stable, cardiology following.  Monitor orthostatics, PT OT.  Also on trazodone, Cardura and Exelon patch, question if any of these medications could be related.   Covid-19 infection: Incidental on admission screening PCR in vaccinated patient [employer] 3/25, 4/19) with no infiltrate on CXR, no inflammatory marker elevation, and no infectious or pulmonary symptoms. Received bamlanivimab/etesevimab 11/11.  Folic acid deficiency:  Started supplementation.   Pancytopenia: Likely related to covid and anemia of chronic disease. Monitor.   Stage IIIa CKD: UA with no proteinuria, but with hyaline casts,  Hold torsemide for now,  Avoid nephrotoxins.   Parkinson's disease: Followed by Dr. Karel Jarvis. - Continue sinemet, rivastigmine.  Hypokalemia: - Replaced and stable.  BPH:   With severe hypotension-related to syncope, possible autonomic dysregulation with PD, will stop cardura for now.  Monitor.      Condition -   Guarded  Family Communication  :  Wife [phone] on 05/06/20  Code Status :  Full  Consults  :  Neuro  Procedures  :     MRI-A -   CT - A small infarct is questioned within the posterior left cerebellar hemisphere, new from the prior head CT of 02/16/2019 but otherwise age indeterminate. Moderate cerebral atrophy and chronic small vessel ischemic disease. Mild ethmoid and maxillary sinus mucosal thickening.  TTE - 1. Left ventricular ejection fraction, by  estimation, is 55 to 60%. The left ventricle has normal function. There is mild left ventricular hypertrophy. Left ventricular diastolic parameters are consistent with Grade I diastolic dysfunction (impaired  relaxation).  2. Right ventricular systolic function is normal. The right ventricular size is normal. Tricuspid regurgitation signal is inadequate for assessing PA pressure.  3. Left atrial size was mildly dilated.  4. The mitral valve is normal in structure. No evidence of mitral valve regurgitation. No evidence of mitral stenosis.  5. The aortic valve was not well visualized. Aortic valve regurgitation is trivial. No aortic stenosis is present.  6. Technically difficult study with poor acoustic windows.   Carotid US - 1-39% stenosis bilaterally.  EEG -  Continuousslow, generalized. This study is suggestive of mild diffuse encephalopathy, nonspecific etiology.No seizures or epileptiform discharges were seen throughout the recording.   PUD Prophylaxis :   Disposition Plan  :    Status is: Inpatient  Remains inpatient appropriate because:IV treatments appropriate due to intensity of illness or inability to take PO   Dispo: The patient is from: Home              Anticipated d/c is to: SNF              Anticipated d/c date is: 3 days              Patient currently is medically stable to d/c.   DVT Prophylaxis  :  Lovenox   Lab Results  Component Value Date   PLT 86 (L) 05/06/2020    Diet :  Diet Order            Diet regular Room service appropriate? Yes; Fluid consistency: Thin  Diet effective now                  Inpatient Medications  Scheduled Meds:  aspirin  325 mg Oral Daily   B-complex with vitamin C  1 tablet Oral Daily   carbidopa-levodopa  1 tablet Oral TID WC   cholecalciferol  1,000 Units Oral Daily   enoxaparin (LOVENOX) injection  40 mg Subcutaneous Q24H   rivastigmine  1.5 mg Oral BID   traZODone  100 mg Oral QHS   Continuous  Infusions:  0.9 % NaCl with KCl 20 mEq / L 75 mL/hr at 05/06/20 0501   PRN Meds:.haloperidol lactate  Antibiotics  :    Anti-infectives (From admission, onward)   None       Time Spent in minutes  30   Susa Raring M.D on 05/06/2020 at 9:17 AM  To page go to [email or web address] - password [facility]  [facility] -  Office  [phone]  See all Orders from today for further details    Objective:   Vitals:   05/05/20 1920 05/05/20 2320 05/06/20 0435 05/06/20 0816  BP: (!) 196/97 (!) 160/74 (!) 200/106 (!) 164/91  Pulse:  62 71 74  Resp:  Temp: 98.6 F (37 C) 97.9 F (36.6 C) 98.8 F (37.1 C) 98.3 F (36.8 C)  TempSrc: Oral Oral Oral Oral  SpO2:  94% 99% 99%  Weight:      Height:        Wt Readings from Last 3 Encounters:  05/04/20 77.5 kg  04/13/20 77.5 kg  01/17/20 78.9 kg     Intake/Output Summary (Last 24 hours) at 05/06/2020 0917 Last data filed at 05/06/2020 0501 Gross per 24 hour  Intake 1755.1 ml  Output 1100 ml  Net 655.1 ml     Physical Exam  Awake, slow to respond but answering all questions and following commands, no apparent focal deficits.   [place].AT,PERRAL Supple Neck,No JVD, No cervical lymphadenopathy appriciated.  Symmetrical Chest wall movement, Good air movement bilaterally, CTAB RRR,No Gallops,Rubs or new Murmurs, No Parasternal Heave +ve B.Sounds, Abd Soft, No tenderness, No organomegaly appriciated, No rebound - guarding or rigidity. No Cyanosis, Clubbing or edema, No new Rash or bruise      Data Review:    CBC Recent Labs  Lab 05/03/20 1308 05/04/20 0354 05/05/20 0401 05/06/20 0624  WBC 1.9* 2.7* 2.4* 2.6*  HGB 10.8* 11.4* 11.2* 10.6*  HCT 31.3* 32.6* 31.4* 29.9*  PLT 105* 115* 101* 86*  MCV 88.7 87.6 85.6 85.2  MCH 30.6 30.6 30.5 30.2  MCHC 34.5 35.0 35.7 35.5  RDW 11.8 11.5 11.6 11.6  LYMPHSABS 0.5* 1.0 0.6*  --   MONOABS 0.3 0.3 0.3  --   EOSABS 0.0 0.0 0.0  --   BASOSABS 0.0 0.0 0.0  --      Recent Labs  Lab 05/03/20 1308 05/03/20 1632 05/04/20 0354 05/05/20 0401 05/06/20 0624  NA 141 139 135 137 137  K 3.3* 3.7 3.0* 3.3* 3.5  CL 110 105 102 105 107  CO2 GLUCOSE 108* 112* 102* 106* 93  BUN CREATININE 1.48* 1.71* 1.51* 1.56* 1.57*  CALCIUM 6.8* 8.2* 8.1* 8.3* 8.0*  AST 12* 13*  --   --   --   ALT <5 <5  --   --   --   ALKPHOS 52 66  --   --   --   BILITOT 0.9 1.0  --   --   --   ALBUMIN 2.7* 3.5  --   --   --   MG  --   --  1.7 2.0  --   CRP  --  0.6  --   --   --   DDIMER  --  0.96*  --   --   --  PROCALCITON  --  <0.10  --   --   --   LATICACIDVEN  --  1.1  --   --   --   HGBA1C  --   --   --  5.4  --     ------------------------------------------------------------------------------------------------------------------ Recent Labs    05/03/20 1632 05/05/20 0401  CHOL  --  139  HDL  --  39*  LDLCALC  --  90  TRIG 62 52  CHOLHDL  --  3.6    Lab Results  Component Value Date   HGBA1C 5.4 05/05/2020   ------------------------------------------------------------------------------------------------------------------ No results for input(s): TSH, T4TOTAL, T3FREE, THYROIDAB in the last 72 hours.  Invalid input(s): FREET3  Cardiac Enzymes No results for input(s): CKMB, TROPONINI, MYOGLOBIN in the last 168 hours.  Invalid input(s): CK ------------------------------------------------------------------------------------------------------------------ No results found for: BNP  Micro Results Recent Results (from the past 240 hour(s))  Respiratory Panel by RT PCR (Flu A&B, Covid) - Nasopharyngeal Swab     Status: Abnormal   Collection Time: 05/03/20  1:13 PM   Specimen: Nasopharyngeal Swab  Result Value Ref Range Status   SARS Coronavirus 2 by RT PCR POSITIVE (A) NEGATIVE Final    Comment: RESULT CALLED TO, READ BACK BY AND VERIFIED WITH: FRANKLIN,C. RN @1510  05/03/20 BILLINGSLEY,L    Influenza A by PCR NEGATIVE  NEGATIVE Final   Influenza B by PCR NEGATIVE NEGATIVE Final    Comment: Performed at [facility], [address], [place], [place] [address]  Culture, blood (single)     Status: None (Preliminary result)   Collection Time: 05/03/20  4:32 PM   Specimen: BLOOD RIGHT FOREARM  Result Value Ref Range Status   Specimen Description   Final    BLOOD RIGHT FOREARM Performed at [facility], [address] [place], [place] [address]    Special Requests   Final    BOTTLES DRAWN AEROBIC AND ANAEROBIC Blood Culture adequate volume Performed at [facility], [address] [place], [place] [address]    Culture   Final    NO GROWTH 2 DAYS Performed at [facility], [address] [place], [place] [address]    Report Status PENDING  Incomplete    Radiology Reports EEG  Result Date: 05/05/2020 Charlsie Quest, MD     05/05/2020  9:30 AM Patient Name: DAMIAN BUCKLES MRN: [id number] Epilepsy Attending: Charlsie Quest Referring Physician/Provider: Dr Albertine Grates Date: 05/04/2020 Duration: 23.26 minutes Patient history: 79 year old male with episodes of syncope x2.  EEG to evaluate for seizures. Level of alertness: Awake, drowsy, sleep, comatose, lethargic AEDs during EEG study: None Technical aspects: This EEG study was done with scalp electrodes positioned according to the 10-20 International system of electrode placement. Electrical activity was acquired at a sampling rate of 500Hz  and reviewed with a high frequency filter of 70Hz  and a low frequency filter of 1Hz . EEG data were recorded continuously and digitally stored. Description: The posterior dominant rhythm consists of 8 Hz activity of moderate voltage (25-35 uV) seen predominantly in posterior head regions, symmetric and reactive to eye opening and eye closing. EEG showed continuous generalized 5 to 6 Hz theta as well as intermittent 2 to 3 Hz delta slowing. Hyperventilation and photic  stimulation were not performed.   ABNORMALITY -Continuous slow, generalized IMPRESSION: This study is suggestive of mild diffuse encephalopathy, nonspecific etiology. No seizures or epileptiform discharges were seen throughout the recording. Priyanka Annabelle Harman   DG  Chest 2 View  Result Date: 05/03/2020 CLINICAL DATA:  80 year old male with a history of syncope EXAM: CHEST - 2 VIEW COMPARISON:  07/03/2019 FINDINGS: Cardiomediastinal silhouette likely unchanged in size and contour given the positioning. No pneumothorax. No pleural effusion. Coarsened interstitial markings bilateral lungs without confluent airspace disease. No acute displaced fracture. Degenerative changes of the visualized spine. IMPRESSION: Negative for acute cardiopulmonary disease Electronically Signed   By: Gilmer Mor D.O.   On: 05/03/2020 13:50   CT Head Wo Contrast  Result Date: 05/03/2020 CLINICAL DATA:  Mental status change, unknown cause. EXAM: CT HEAD WITHOUT CONTRAST TECHNIQUE: Contiguous axial images were obtained from the base of the skull through the vertex without intravenous contrast. COMPARISON:  Head CT 02/16/2019.  Brain MRI 11/23/2018. FINDINGS: Brain: Moderate cerebral atrophy. Moderate ill-defined hypoattenuation within the cerebral white matter is nonspecific, but compatible with chronic small vessel ischemic disease. A small infarct is questioned within the posterior left cerebellar hemisphere, new from the prior head CT of 02/16/2019 but otherwise age indeterminate (series 2, image 9) (series 5, image 56). There is no acute intracranial hemorrhage. No demarcated cortical infarct. No extra-axial fluid collection. No evidence of intracranial mass. No midline shift. Vascular: No hyperdense vessel.  Atherosclerotic calcifications. Skull: Normal. Negative for fracture or focal lesion. Sinuses/Orbits: Visualized orbits show no acute finding. Mild ethmoid and maxillary sinus mucosal thickening. IMPRESSION: A small infarct  is questioned within the posterior left cerebellar hemisphere, new from the prior head CT of 02/16/2019 but otherwise age indeterminate. Moderate cerebral atrophy and chronic small vessel ischemic disease. Mild ethmoid and maxillary sinus mucosal thickening. Electronically Signed   By: Jackey Loge DO   On: 05/03/2020 19:26   VAS US CAROTID  Result Date: 05/05/2020 Carotid Arterial Duplex Study Indications:       CVA. Risk Factors:      Hypertension. Limitations        Today's exam was limited due to the patient's respiratory                    variation. Comparison Study:  No prior studies. Performing Technologist: Chanda Busing RVT  Examination Guidelines: A complete evaluation includes B-mode imaging, spectral Doppler, color Doppler, and power Doppler as needed of all accessible portions of each vessel. Bilateral testing is considered an integral part of a complete examination. Limited examinations for reoccurring indications may be performed as noted.  Right Carotid Findings: +----------+--------+--------+--------+-----------------------+--------+             PSV cm/s EDV cm/s Stenosis Plaque Description      Comments  +----------+--------+--------+--------+-----------------------+--------+  CCA Prox   111      14                smooth and heterogenous           +----------+--------+--------+--------+-----------------------+--------+  CCA Distal 78       14                smooth and heterogenous           +----------+--------+--------+--------+-----------------------+--------+  ICA Prox   97       16                smooth and heterogenous           +----------+--------+--------+--------+-----------------------+--------+  ICA Distal 51       14  tortuous  +----------+--------+--------+--------+-----------------------+--------+  ECA        224      26                                                   +----------+--------+--------+--------+-----------------------+--------+ +----------+--------+-------+--------+-------------------+             PSV cm/s EDV cms Describe Arm Pressure (mmHG)  +----------+--------+-------+--------+-------------------+  Subclavian 133                                            +----------+--------+-------+--------+-------------------+ +---------+--------+--+--------+--+---------+  Vertebral PSV cm/s 59 EDV cm/s 12 Antegrade  +---------+--------+--+--------+--+---------+  Left Carotid Findings: +----------+--------+--------+--------+-----------------------+--------+             PSV cm/s EDV cm/s Stenosis Plaque Description      Comments  +----------+--------+--------+--------+-----------------------+--------+  CCA Prox   107      14                smooth and heterogenous           +----------+--------+--------+--------+-----------------------+--------+  CCA Distal 111      14                smooth and heterogenous           +----------+--------+--------+--------+-----------------------+--------+  ICA Prox   133      21                smooth and heterogenous           +----------+--------+--------+--------+-----------------------+--------+  ICA Distal 88       24                                        tortuous  +----------+--------+--------+--------+-----------------------+--------+  ECA        107      9                                                   +----------+--------+--------+--------+-----------------------+--------+ +----------+--------+--------+--------+-------------------+             PSV cm/s EDV cm/s Describe Arm Pressure (mmHG)  +----------+--------+--------+--------+-------------------+  Subclavian 200                                             +----------+--------+--------+--------+-------------------+ +---------+--------+--+--------+--+---------+  Vertebral PSV cm/s 57 EDV cm/s 12 Antegrade  +---------+--------+--+--------+--+---------+   Summary: Right Carotid:  Velocities in the right ICA are consistent with a 1-39% stenosis. Left Carotid: Velocities in the left ICA are consistent with a 1-39% stenosis. Vertebrals: Bilateral vertebral arteries demonstrate antegrade flow. *See table(s) above for measurements and observations.     Preliminary    ECHOCARDIOGRAM LIMITED  Result Date: 05/04/2020    ECHOCARDIOGRAM LIMITED REPORT   Patient Name:   DAYSHAWN IRIZARRY Date of Exam: 05/04/2020 Medical Rec #:  [id number]      Height:  68.0 in Accession #:    [id number]     Weight:       170.9 lb Date of Birth:  09/01/1940       BSA:          1.911 m Patient Age:    79 years       BP:           180/84 mmHg Patient Gender: M              HR:           70 bpm. Exam Location:  Inpatient Procedure: Limited Echo, Cardiac Doppler and Color Doppler Indications:    Syncope  History:        Patient has no prior history of Echocardiogram examinations.                 COPD, Signs/Symptoms:Syncope; Risk Factors:Hypertension,                 Dyslipidemia and Former Smoker. Parkinsons Dementia, COVID+.  Sonographer:    Lavenia AtlasBrooke Strickland Referring Phys: Albertine GratesXU, FANG  Sonographer Comments: Technically challenging study due to limited acoustic windows. Image acquisition challenging due to COPD. IMPRESSIONS  1. Left ventricular ejection fraction, by estimation, is 55 to 60%. The left ventricle has normal function. There is mild left ventricular hypertrophy. Left ventricular diastolic parameters are consistent with Grade I diastolic dysfunction (impaired relaxation).  2. Right ventricular systolic function is normal. The right ventricular size is normal. Tricuspid regurgitation signal is inadequate for assessing PA pressure.  3. Left atrial size was mildly dilated.  4. The mitral valve is normal in structure. No evidence of mitral valve regurgitation. No evidence of mitral stenosis.  5. The aortic valve was not well visualized. Aortic valve regurgitation is trivial. No aortic stenosis is present.  6.  Technically difficult study with poor acoustic windows. FINDINGS  Left Ventricle: Left ventricular ejection fraction, by estimation, is 55 to 60%. The left ventricle has normal function. The left ventricular internal cavity size was normal in size. There is mild left ventricular hypertrophy. Left ventricular diastolic  parameters are consistent with Grade I diastolic dysfunction (impaired relaxation). Right Ventricle: The right ventricular size is normal. No increase in right ventricular wall thickness. Right ventricular systolic function is normal. Tricuspid regurgitation signal is inadequate for assessing PA pressure. Left Atrium: Left atrial size was mildly dilated. Right Atrium: Right atrial size was normal in size. Mitral Valve: The mitral valve is normal in structure. No evidence of mitral valve stenosis. Tricuspid Valve: The tricuspid valve is normal in structure. Tricuspid valve regurgitation is not demonstrated. Aortic Valve: The aortic valve was not well visualized. Aortic valve regurgitation is trivial. No aortic stenosis is present. Pulmonic Valve: The pulmonic valve was not well visualized. Aorta: The aortic root is normal in size and structure. Venous: The inferior vena cava was not well visualized. IAS/Shunts: No atrial level shunt detected by color flow Doppler. LEFT VENTRICLE PLAX 2D LVIDd:         3.70 cm Diastology LVIDs:         2.60 cm LV e' medial:    4.79 cm/s LV PW:         1.40 cm LV E/e' medial:  9.3 LV IVS:        1.20 cm LV e' lateral:   5.77 cm/s                        LV  E/e' lateral: 7.7  RIGHT VENTRICLE RV S prime:     7.07 cm/s MITRAL VALVE MV Area (PHT): 2.22 cm MV Decel Time: 342 msec MV E velocity: 44.60 cm/s MV A velocity: 62.60 cm/s MV E/A ratio:  0.71 Marca Ancona MD Electronically signed by Marca Ancona MD Signature Date/Time: 05/04/2020/3:45:20 PM    Final

## 2020-05-06 NOTE — Consult Note (Signed)
NEURO HOSPITALIST CONSULT NOTE   Requesting physician: Dr. Thedore Mins  Reason for Consult: Syncopal episodes  History obtained from: Chart, wife  HPI:                                                                                                                                         [name] is an 79 y.o. male with a past medical history of Parkinson's Disease, demetia with a history of falls who presented to [facility] after two episodes of non-responsiveness. The first episode was Saturday (06Nov2021), he was seated and regained consciousness after about two minutes. On Wednesday (10Nov2021), he was again seated and lost consciousness for an unknown amount of time (wife reported 7-8, 10-12 and over 25 minutes). In both instances, [name] continued breathing normally, but was just unable to be roused. His wife says he hasn't been himself for a week or so, where he had variable changes in his cognitive ability, more unstable.  SARS-CoV-2 PCR tested positive upon arrival.  [name] wife said that [name] was being released from [facility] approximately two years ago when he passed out, and "they" reported that his blood pressure dropped. I have found no record of this in his chart.  On my visit, [name] was in bed and could be occasionally roused by loud verbal stimuli. He had received  of haldol and  of ativan for MRI sedation and was slowly recovering. His nurse reported a slow increase in his responsiveness over the course of the day.   Pertinent Medications Sinemet 25-100 TID Rivastigmine 1.5mg  BID Trazodone  at bedtime   Pertinent Imaging/Diagnostics CT head 10Nov2021:A small infarct is questioned within the posterior left cerebellar hemisphere, new from the prior head CT of 02/16/2019 but otherwise age indeterminate. Moderate cerebral atrophy and chronic small vessel ischemic disease. EEG 11Nov2021: Unremarkable, EF 55-60% EEG 12Nov2021:  This study is suggestive of mild diffuse encephalopathy, nonspecific etiology. No seizures or epileptiform discharges were seen throughout the recording. MR Brain/MRA 13Nov2021: Unremarkable   Past Medical History:  Diagnosis Date  . B12 deficiency 11/19/2018  . Benign prostatic hypertrophy   . Edema   . Hyperlipidemia   . Hypertension   . Insomnia   . Parasomnia 10/14/2011   PSG 12/15/11>>AHI 0.4, SpO2 low 89%, PLMI 16, somnoliloquay, nocturia   . Prediabetes 06/10/2012  . Seasonal allergies   . Sepsis (HCC) 01/2019    Past Surgical History:  Procedure Laterality Date  . CYSTOSCOPY/URETEROSCOPY/HOLMIUM LASER/STENT PLACEMENT Left 07/05/2019   Procedure: CYSTOSCOPY/URETEROSCOPY/STENT PLACEMENT/REROGRADE;  Surgeon: Crista Elliot, MD;  Location: [facility];  Service: Urology;  Laterality: Left;  . FINGER SURGERY Right 04/19/2014   RIGHT INDEX & RING FINGER   FROM DOG BITE   . I & D EXTREMITY  Right 04/20/2014   Procedure: IRRIGATION AND DEBRIDEMENT right index finger and right ring finger;  Surgeon: Dominica Severin, MD;  Location: [facility] OR;  Service: Orthopedics;  Laterality: Right;  . I & D EXTREMITY Right 04/21/2014   Procedure: IRRIGATION AND DEBRIDEMENT RIGHT HAND RING FINGER, INDEX FINGER AND Flexor Tenolysis and FDS Tenotomy;  Surgeon: Dominica Severin, MD;  Location: [facility] OR;  Service: Orthopedics;  Laterality: Right;    Family History  Problem Relation Age of Onset  . Diabetes Mother   . Tremor Father   . Diabetes Other     Social History:  reports that he has quit smoking. His smoking use included cigarettes and cigars. He has a 7.00 pack-year smoking history. He has never used smokeless tobacco. He reports previous alcohol use. He reports that he does not use drugs.  Allergies  Allergen Reactions  . Amlodipine     Edema w/10 mg  . Atorvastatin     REACTION: leg cramps  . Lorazepam     DO NOT GIVE BENZO TO [facility] WITH CONCERNS FOR LEWY BODY DEMENTIA, CAUSES  PARADOXICAL WORSENING OF THEIR AGITATION!  . Penicillins Hives    Has patient had a PCN reaction causing immediate rash, facial/tongue/throat swelling, SOB or lightheadedness with hypotension: Yes Has patient had a PCN reaction causing severe rash involving mucus membranes or skin necrosis: No Has patient had a PCN reaction that required hospitalization No Has patient had a PCN reaction occurring within the last 10 years: No If all of the above answers are "NO", then may proceed with Cephalosporin use.   . Sulfonamide Derivatives Hives  . Telmisartan     ?fatigue  . Sulfa Antibiotics Other (See Comments) and Palpitations    Sweating, passed out    MEDICATIONS:                                                                                                                   Current Meds  Medication Sig  . carbidopa-levodopa (SINEMET IR) 25-100 MG tablet Take 1 tablet three times a day with meals (Patient taking differently: Take 1 tablet by mouth 3 (three) times daily. )  . Cholecalciferol (VITAMIN D) 1000 UNITS capsule Take 1 capsule (1,000 Units total) by mouth daily.  . cyanocobalamin (,VITAMIN B-12,) 1000 MCG/ML injection 1 ml sq for 5 days, then 1 ml weekly for 4 weeks, then 1 ml every 2 weeks (Patient taking differently: Inject 1,000 mcg into the muscle every 14 (fourteen) days. )  . doxazosin (CARDURA) 4 MG tablet Take 0.5 tablets (2 mg total) by mouth daily.  [name] loratadine-pseudoephedrine (CLARITIN-D 24-HOUR) 10-240 MG 24 hr tablet Take 1 tablet by mouth daily as needed for allergies.  . rivastigmine (EXELON) 1.5 MG capsule Take 1 capsule (1.5 mg total) by mouth 2 (two) times daily.  [name] torsemide (DEMADEX) 100 MG tablet Take 0.5-1 tablets (50-100 mg total) by mouth daily as needed. (Patient taking differently: Take 50-100 mg by mouth daily as needed (swelling in ankles). )  .  traZODone (DESYREL) 50 MG tablet TAKE 2 TABLETS BY MOUTH AT BEDTIME (Patient taking differently: Take 100 mg by  mouth at bedtime. )  . vitamin B-12 (CYANOCOBALAMIN) 1000 MCG tablet Take 1,000 mcg by mouth daily.     Review Of Systems:                                                                                                           History obtained from unobtainable from patient due to mental status   Blood pressure 129/75, pulse 63, temperature 97.9 F (36.6 C), temperature source Oral, resp. rate 16, height 5\' 8"  (1.727 m), weight 77.5 kg, SpO2 100 %.   Physical Examination:                                                                                                      General: WDWN male. Appears calm and comfortable in bed HEENT:  Normocephalic, no lesions, without obvious abnormality.  Normal external eye and conjunctiva.  Normal external ears, nose, dry mucus membranes. Cardiovascular: regular rate and rhythm, pulses palpable throughout   Pulmonary: Breathing comfortably on room air Abdomen: Soft, non-tender Musculoskeletal: Tone and bulk normal throughout; no atrophy noted Skin: warm and dry, no hyperpigmentation, vitiligo, or suspicious lesions  Neurological Examination:                                                                                               Mental Status: [name] is difficult to rouse to loud verbal stimuli. He is oriented to self, believes he is at a "doctor's office" and it is 2013. Speech fluent without evidence of aphasia. He does not follow most commands. Cranial Nerves: II: Equal, round, slowly reactive to light. III,IV, VI: Ptosis not present V,VII: Face is symmetric VIII: Hearing grossly intact to loud verbal stimuli IX,X: Unable to assess XI: Unable to assess XII: Unable to assess Motor: Squeezes my fingers and wiggles toes bilaterally. Cogwheel rigidity present in BUE. Sensory: Responds to noxious stimuli Deep Tendon Reflexes: 1+ and symmetric throughout Plantars: Mute Cerebellar: Unable to assess. Proprioception: Unable to  assess Gait: Unable to assess   Lab Results: Basic Metabolic Panel: Recent Labs  Lab 05/03/20 1308 05/03/20 1308 05/03/20 1632 05/03/20 1632 05/04/20  0354 05/05/20 0401 05/06/20 0624  NA 141  --  139  --  135 137 137  K 3.3*  --  3.7  --  3.0* 3.3* 3.5  CL 110  --  105  --  102 105 107  CO2 24  --  27  --  24 26 22   GLUCOSE 108*  --  112*  --  102* 106* 93  BUN 20  --  21  --  18 18 18   CREATININE 1.48*  --  1.71*  --  1.51* 1.56* 1.57*  CALCIUM 6.8*   < > 8.2*   < > 8.1* 8.3* 8.0*  MG  --   --   --   --  1.7 2.0  --    < > = values in this interval not displayed.    Liver Function Tests: Recent Labs  Lab 05/03/20 1308 05/03/20 1632  AST 12* 13*  ALT <5 <5  ALKPHOS 52 66  BILITOT 0.9 1.0  PROT 4.8* 6.1*  ALBUMIN 2.7* 3.5   No results for input(s): LIPASE, AMYLASE in the last 168 hours. No results for input(s): AMMONIA in the last 168 hours.  CBC: Recent Labs  Lab 05/03/20 1308 05/04/20 0354 05/05/20 0401 05/06/20 0624  WBC 1.9* 2.7* 2.4* 2.6*  NEUTROABS 1.1* 1.4* 1.5*  --   HGB 10.8* 11.4* 11.2* 10.6*  HCT 31.3* 32.6* 31.4* 29.9*  MCV 88.7 87.6 85.6 85.2  PLT 105* 115* 101* 86*    Cardiac Enzymes: No results for input(s): CKTOTAL, CKMB, CKMBINDEX, TROPONINI in the last 168 hours.  Lipid Panel: Recent Labs  Lab 05/03/20 1632 05/05/20 0401  CHOL  --  139  TRIG 62 52  HDL  --  39*  CHOLHDL  --  3.6  VLDL  --  10  LDLCALC  --  90    CBG: No results for input(s): GLUCAP in the last 168 hours.  Microbiology: Results for orders placed or performed during the hospital encounter of 05/03/20  Respiratory Panel by RT PCR (Flu A&B, Covid) - Nasopharyngeal Swab     Status: Abnormal   Collection Time: 05/03/20  1:13 PM   Specimen: Nasopharyngeal Swab  Result Value Ref Range Status   SARS Coronavirus 2 by RT PCR POSITIVE (A) NEGATIVE Final    Comment: RESULT CALLED TO, READ BACK BY AND VERIFIED WITH: FRANKLIN,C. RN @1510  05/03/20 BILLINGSLEY,L     Influenza A by PCR NEGATIVE NEGATIVE Final   Influenza B by PCR NEGATIVE NEGATIVE Final    Comment: Performed at [facility], [address], [place], [place]  Culture, blood (single)     Status: None (Preliminary result)   Collection Time: 05/03/20  4:32 PM   Specimen: BLOOD RIGHT FOREARM  Result Value Ref Range Status   Specimen Description   Final    BLOOD RIGHT FOREARM Performed at [facility], [address], [place], [place]    Special Requests   Final    BOTTLES DRAWN AEROBIC AND ANAEROBIC Blood Culture adequate volume Performed at [facility], [address] [place], [place]    Culture   Final    NO GROWTH 3 DAYS Performed at [facility], [address] [place], [facility] [address]    Report Status PENDING  Incomplete    Coagulation Studies: No results for input(s): LABPROT, INR in the last 72 hours.  Imaging: EEG  Result Date: 05/05/2020 [place]  Val Eagle, MD     05/05/2020  9:30 AM Patient Name: [name] MRN: [id number] Epilepsy Attending: Charlsie Quest Referring Physician/Provider: Dr Albertine Grates Date: 05/04/2020 Duration: 23.26 minutes Patient history: 79 year old male with episodes of syncope x2.  EEG to evaluate for seizures. Level of alertness: Awake, drowsy, sleep, comatose, lethargic AEDs during EEG study: None Technical aspects: This EEG study was done with scalp electrodes positioned according to the 10-20 International system of electrode placement. Electrical activity was acquired at a sampling rate of 500Hz  and reviewed with a high frequency filter of 70Hz  and a low frequency filter of 1Hz . EEG data were recorded continuously and digitally stored. Description: The posterior dominant rhythm consists of 8 Hz activity of moderate voltage (25-35 uV) seen predominantly in posterior head regions, symmetric and reactive to eye opening and eye closing. EEG showed continuous  generalized 5 to 6 Hz theta as well as intermittent 2 to 3 Hz delta slowing. Hyperventilation and photic stimulation were not performed.   ABNORMALITY -Continuous slow, generalized IMPRESSION: This study is suggestive of mild diffuse encephalopathy, nonspecific etiology. No seizures or epileptiform discharges were seen throughout the recording. Priyanka Annabelle Harman   MR ANGIO HEAD WO CONTRAST  Result Date: 05/06/2020 CLINICAL DATA:  Posterior cerebellar infarction, age indeterminate. EXAM: MRI HEAD WITHOUT CONTRAST MRA HEAD WITHOUT CONTRAST MRA NECK WITHOUT CONTRAST TECHNIQUE: Multiplanar, multiecho pulse sequences of the brain and surrounding structures were obtained without intravenous contrast. Angiographic images of the Circle of Willis were obtained using MRA technique without intravenous contrast. Angiographic images of the neck were obtained using MRA technique without intravenous contrast. Carotid stenosis measurements (when applicable) are obtained utilizing NASCET criteria, using the distal internal carotid diameter as the denominator. COMPARISON:  Head CT 05/03/2020 FINDINGS: MRI HEAD FINDINGS Brain: Diffusion imaging does not show any acute or subacute infarction. There are old small vessel infarctions within the left posterior cerebellum. Mild chronic small-vessel change affects the pons. Cerebral hemispheres show generalized atrophy, temporal lobe predominant. There are moderate chronic small-vessel ischemic changes of the deep white matter. No large vessel territory cerebral hemispheric infarction. No mass lesion, hemorrhage, hydrocephalus or extra-axial collection. Vascular: Major vessels at the base of the brain show flow. Skull and upper cervical spine: Negative Sinuses/Orbits: Mild mucosal inflammatory changes of the sinuses. Orbits negative. Other: None MRA HEAD FINDINGS Both internal carotid arteries widely patent through the skull base and siphon regions. No siphon stenosis. The anterior and  middle cerebral vessels are patent without proximal stenosis, aneurysm or vascular malformation. No large or medium vessel occlusion. Fenestrated left M1 segment. Both vertebral arteries are patent to the basilar. No basilar stenosis. Posterior circulation branch vessels appear normal. MRA NECK FINDINGS Limited resolution due to noncontrast technique. Both common carotid arteries show antegrade flow to the bifurcation regions. On the right, there is no apparent stenosis. On the left, there is too much artifact to evaluate the bifurcation region. Antegrade flow is present in both vertebral arteries. IMPRESSION: No acute intracranial finding. 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 Moderate chronic small-vessel ischemic changes of the cerebral hemispheric deep white matter. Generalized atrophy, temporal lobe predominant. 1. MRA head: No large or medium vessel occlusion or correctable proximal stenosis. 2. MRA neck: Allowing for noncontrast technique, there is no visible carotid bifurcation stenosis on the right. Left carotid bifurcation cannot be seen because of artifact. Antegrade flow is present in both vertebral arteries. Electronically Signed   By: Paulina Fusi M.D.   On: 05/06/2020 13:56  MR ANGIO NECK WO CONTRAST  Result Date: 05/06/2020 CLINICAL DATA:  Posterior cerebellar infarction, age indeterminate. EXAM: MRI HEAD WITHOUT CONTRAST MRA HEAD WITHOUT CONTRAST MRA NECK WITHOUT CONTRAST TECHNIQUE: Multiplanar, multiecho pulse sequences of the brain and surrounding structures were obtained without intravenous contrast. Angiographic images of the Circle of Willis were obtained using MRA technique without intravenous contrast. Angiographic images of the neck were obtained using MRA technique without intravenous contrast. Carotid stenosis measurements (when applicable) are obtained utilizing NASCET criteria, using the distal internal carotid diameter as the denominator. COMPARISON:  Head CT  05/03/2020 FINDINGS: MRI HEAD FINDINGS Brain: Diffusion imaging does not show any acute or subacute infarction. There are old small vessel infarctions within the left posterior cerebellum. Mild chronic small-vessel change affects the pons. Cerebral hemispheres show generalized atrophy, temporal lobe predominant. There are moderate chronic small-vessel ischemic changes of the deep white matter. No large vessel territory cerebral hemispheric infarction. No mass lesion, hemorrhage, hydrocephalus or extra-axial collection. Vascular: Major vessels at the base of the brain show flow. Skull and upper cervical spine: Negative Sinuses/Orbits: Mild mucosal inflammatory changes of the sinuses. Orbits negative. Other: None MRA HEAD FINDINGS Both internal carotid arteries widely patent through the skull base and siphon regions. No siphon stenosis. The anterior and middle cerebral vessels are patent without proximal stenosis, aneurysm or vascular malformation. No large or medium vessel occlusion. Fenestrated left M1 segment. Both vertebral arteries are patent to the basilar. No basilar stenosis. Posterior circulation branch vessels appear normal. MRA NECK FINDINGS Limited resolution due to noncontrast technique. Both common carotid arteries show antegrade flow to the bifurcation regions. On the right, there is no apparent stenosis. On the left, there is too much artifact to evaluate the bifurcation region. Antegrade flow is present in both vertebral arteries. IMPRESSION: No acute intracranial finding. 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 Moderate chronic small-vessel ischemic changes of the cerebral hemispheric deep white matter. Generalized atrophy, temporal lobe predominant. 1. MRA head: No large or medium vessel occlusion or correctable proximal stenosis. 2. MRA neck: Allowing for noncontrast technique, there is no visible carotid bifurcation stenosis on the right. Left carotid bifurcation cannot be seen  because of artifact. Antegrade flow is present in both vertebral arteries. Electronically Signed   By: Paulina Fusi M.D.   On: 05/06/2020 13:56   MR BRAIN WO CONTRAST  Result Date: 05/06/2020 CLINICAL DATA:  Posterior cerebellar infarction, age indeterminate. EXAM: MRI HEAD WITHOUT CONTRAST MRA HEAD WITHOUT CONTRAST MRA NECK WITHOUT CONTRAST TECHNIQUE: Multiplanar, multiecho pulse sequences of the brain and surrounding structures were obtained without intravenous contrast. Angiographic images of the Circle of Willis were obtained using MRA technique without intravenous contrast. Angiographic images of the neck were obtained using MRA technique without intravenous contrast. Carotid stenosis measurements (when applicable) are obtained utilizing NASCET criteria, using the distal internal carotid diameter as the denominator. COMPARISON:  Head CT 05/03/2020 FINDINGS: MRI HEAD FINDINGS Brain: Diffusion imaging does not show any acute or subacute infarction. There are old small vessel infarctions within the left posterior cerebellum. Mild chronic small-vessel change affects the pons. Cerebral hemispheres show generalized atrophy, temporal lobe predominant. There are moderate chronic small-vessel ischemic changes of the deep white matter. No large vessel territory cerebral hemispheric infarction. No mass lesion, hemorrhage, hydrocephalus or extra-axial collection. Vascular: Major vessels at the base of the brain show flow. Skull and upper cervical spine: Negative Sinuses/Orbits: Mild mucosal inflammatory changes of the sinuses. Orbits negative. Other: None MRA HEAD FINDINGS Both internal carotid  arteries widely patent through the skull base and siphon regions. No siphon stenosis. The anterior and middle cerebral vessels are patent without proximal stenosis, aneurysm or vascular malformation. No large or medium vessel occlusion. Fenestrated left M1 segment. Both vertebral arteries are patent to the basilar. No basilar  stenosis. Posterior circulation branch vessels appear normal. MRA NECK FINDINGS Limited resolution due to noncontrast technique. Both common carotid arteries show antegrade flow to the bifurcation regions. On the right, there is no apparent stenosis. On the left, there is too much artifact to evaluate the bifurcation region. Antegrade flow is present in both vertebral arteries. IMPRESSION: No acute intracranial finding. 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 Moderate chronic small-vessel ischemic changes of the cerebral hemispheric deep white matter. Generalized atrophy, temporal lobe predominant. 1. MRA head: No large or medium vessel occlusion or correctable proximal stenosis. 2. MRA neck: Allowing for noncontrast technique, there is no visible carotid bifurcation stenosis on the right. Left carotid bifurcation cannot be seen because of artifact. Antegrade flow is present in both vertebral arteries. Electronically Signed   By: Paulina Fusi M.D.   On: 05/06/2020 13:56   VAS US CAROTID  Result Date: 05/06/2020 Carotid Arterial Duplex Study Indications:       CVA. Risk Factors:      Hypertension. Limitations        Today's exam was limited due to the patient's respiratory                    variation. Comparison Study:  No prior studies. Performing Technologist: Chanda Busing RVT  Examination Guidelines: A complete evaluation includes B-mode imaging, spectral Doppler, color Doppler, and power Doppler as needed of all accessible portions of each vessel. Bilateral testing is considered an integral part of a complete examination. Limited examinations for reoccurring indications may be performed as noted.  Right Carotid Findings: +----------+--------+--------+--------+-----------------------+--------+           PSV cm/sEDV cm/sStenosisPlaque Description     Comments +----------+--------+--------+--------+-----------------------+--------+ CCA Prox  111     14              smooth and  heterogenous         +----------+--------+--------+--------+-----------------------+--------+ CCA Distal78      14              smooth and heterogenous         +----------+--------+--------+--------+-----------------------+--------+ ICA Prox  97      16              smooth and heterogenous         +----------+--------+--------+--------+-----------------------+--------+ ICA Distal51      14                                     tortuous +----------+--------+--------+--------+-----------------------+--------+ ECA       224     26                                              +----------+--------+--------+--------+-----------------------+--------+ +----------+--------+-------+--------+-------------------+           PSV cm/sEDV cmsDescribeArm Pressure (mmHG) +----------+--------+-------+--------+-------------------+ [id number]                                        +----------+--------+-------+--------+-------------------+ +---------+--------+--+--------+--+---------+  VertebralPSV cm/s59EDV cm/s12Antegrade +---------+--------+--+--------+--+---------+  Left Carotid Findings: +----------+--------+--------+--------+-----------------------+--------+           PSV cm/sEDV cm/sStenosisPlaque Description     Comments +----------+--------+--------+--------+-----------------------+--------+ CCA Prox  107     14              smooth and heterogenous         +----------+--------+--------+--------+-----------------------+--------+ CCA Distal111     14              smooth and heterogenous         +----------+--------+--------+--------+-----------------------+--------+ ICA Prox  133     21              smooth and heterogenous         +----------+--------+--------+--------+-----------------------+--------+ ICA Distal88      24                                     tortuous +----------+--------+--------+--------+-----------------------+--------+ ECA        107     9                                               +----------+--------+--------+--------+-----------------------+--------+ +----------+--------+--------+--------+-------------------+           PSV cm/sEDV cm/sDescribeArm Pressure (mmHG) +----------+--------+--------+--------+-------------------+ Subclavian200                                         +----------+--------+--------+--------+-------------------+ +---------+--------+--+--------+--+---------+ VertebralPSV cm/s57EDV cm/s12Antegrade +---------+--------+--+--------+--+---------+   Summary: Right Carotid: Velocities in the right ICA are consistent with a 1-39% stenosis. Left Carotid: Velocities in the left ICA are consistent with a 1-39% stenosis. Vertebrals: Bilateral vertebral arteries demonstrate antegrade flow. *See table(s) above for measurements and observations.  Electronically signed by Fabienne Bruns MD on 05/06/2020 at 11:49:43 AM.    Final    Assessment and Plan:  [name] is an 79 year old gentleman with a history of Parkinson's Disease and dementia who presents to [facility] following two witnessed episodes of syncope (16XWR and 10Nov2021). MRI/MRA, CT and EEG are unrevealing. There is one remote episode of syncope due to a drop in blood pressure but the circumstances surrounding it are not well characterized. Based on the description of the present events, it is unlikely that orthostatic hypotension is driving these episodes. It is plausible that an autonomic component of non-motor manifestations of Parkinson's Disease is the cause.   Considering the brevity of his inpatient course, I am reluctant to change or add to his Parkinson's medications and instead defer to Dr. Karel Jarvis, his outpatient neurologist, for management. I discussed with [name] and she is in agreement.  There are reports in the chart of AMS in the setting of chronic dementia. As noted earlier, there are no structural abnormalities  that could be the cause. He is not metabolically deranged although it is possible that his COVID or his anemia could play a role (due to reduced cerebral oxygen delivery). On my visit, Mr. Heinlen was still recovering from the haldol provided before his MRI and It wouldn't be reasonable to provide an assessment on this AMS until the haldol has cleared (half life 14-26 hours). Will continue to follow.  Thank you for consulting  the [facility] team.   Bruna Potter PA-C [facility]  05/06/2020, 4:53 PM

## 2020-05-06 NOTE — Progress Notes (Signed)
Pt returned to unit via hospital bed from MRI, he is sleepy but does open his eyes and answer short questions. Made patient aware his daughter called to check on him. Pt refuses lunch at this time stating "I'm not hungry". VSS, will continue to monitor

## 2020-05-06 NOTE — Progress Notes (Signed)
PT Cancellation Note  Patient Details Name: [name] MRN: [id number] DOB: 28-Mar-1941   Cancelled Treatment:    Reason Eval/Treat Not Completed: Patient at procedure or test/unavailable. Pt in MRI. PT to re-attempt as time allows.   Ilda Foil 05/06/2020, 11:47 AM   Aida Raider, PT  Office # [phone] Pager [phone]

## 2020-05-06 NOTE — Progress Notes (Signed)
Pts spouse called and was updated on patient's status. She can be reached at [phone] "[name]"

## 2020-05-06 NOTE — Progress Notes (Signed)
Premedicated with haldol IM as ordered for agitation prior to MRI. Transported to MRI via hospital bed with transporter. All resp and droplet precautions followed during transport.

## 2020-05-06 NOTE — Progress Notes (Signed)
Received call from MRI stating patient was not holding still on the MRI table, too restless. Notified MD, received orders for additional prn meds to be given. Meds to be given in MRI by SWAT nurse Juliette Alcide.

## 2020-05-06 NOTE — Progress Notes (Signed)
Set up patient with breakfast tray. All scheduled meds given at that time. Pt able to feed himself sufficiently and adequately after set up.

## 2020-05-06 NOTE — Progress Notes (Signed)
Received call from daughter, Evaan Tidwell. Password created by [place] and her mom [name]. Daughter can be reached at [phone] for anything at all. They would like to be kept up to date on progress.

## 2020-05-07 DIAGNOSIS — G20 Parkinson's disease: Secondary | ICD-10-CM

## 2020-05-07 LAB — CBC WITH DIFFERENTIAL/PLATELET
Abs Immature Granulocytes: 0.01 10*3/uL (ref 0.00–0.07)
Basophils Absolute: 0 10*3/uL (ref 0.0–0.1)
Basophils Relative: 0 %
Eosinophils Absolute: 0 10*3/uL (ref 0.0–0.5)
Eosinophils Relative: 0 %
HCT: 29.7 % — ABNORMAL LOW (ref 39.0–52.0)
Hemoglobin: 10.4 g/dL — ABNORMAL LOW (ref 13.0–17.0)
Immature Granulocytes: 0 %
Lymphocytes Relative: 22 %
Lymphs Abs: 1 10*3/uL (ref 0.7–4.0)
MCH: 30.2 pg (ref 26.0–34.0)
MCHC: 35 g/dL (ref 30.0–36.0)
MCV: 86.3 fL (ref 80.0–100.0)
Monocytes Absolute: 0.4 10*3/uL (ref 0.1–1.0)
Monocytes Relative: 9 %
Neutro Abs: 3.2 10*3/uL (ref 1.7–7.7)
Neutrophils Relative %: 69 %
Platelets: 96 10*3/uL — ABNORMAL LOW (ref 150–400)
RBC: 3.44 MIL/uL — ABNORMAL LOW (ref 4.22–5.81)
RDW: 11.5 % (ref 11.5–15.5)
WBC: 4.6 10*3/uL (ref 4.0–10.5)
nRBC: 0 % (ref 0.0–0.2)

## 2020-05-07 LAB — COMPREHENSIVE METABOLIC PANEL
ALT: <5 U/L (ref 0–44)
AST: 14 U/L — ABNORMAL LOW (ref 15–41)
Albumin: 2.6 g/dL — ABNORMAL LOW (ref 3.5–5.0)
Alkaline Phosphatase: 51 U/L (ref 38–126)
Anion gap: 5 (ref 5–15)
BUN: 17 mg/dL (ref 8–23)
CO2: 25 mmol/L (ref 22–32)
Calcium: 8.1 mg/dL — ABNORMAL LOW (ref 8.9–10.3)
Chloride: 109 mmol/L (ref 98–111)
Creatinine, Ser: 1.7 mg/dL — ABNORMAL HIGH (ref 0.61–1.24)
GFR, Estimated: 40 mL/min — ABNORMAL LOW (ref >60)
Glucose, Bld: 101 mg/dL — ABNORMAL HIGH (ref 70–99)
Potassium: 3.9 mmol/L (ref 3.5–5.1)
Sodium: 139 mmol/L (ref 135–145)
Total Bilirubin: 1.2 mg/dL (ref 0.3–1.2)
Total Protein: 4.7 g/dL — ABNORMAL LOW (ref 6.5–8.1)

## 2020-05-07 LAB — D-DIMER, QUANTITATIVE: D-Dimer, Quant: 0.6 ug/mL-FEU — ABNORMAL HIGH (ref 0.00–0.50)

## 2020-05-07 LAB — C-REACTIVE PROTEIN: CRP: 8 mg/dL — ABNORMAL HIGH (ref <1.0)

## 2020-05-07 LAB — MAGNESIUM: Magnesium: 1.5 mg/dL — ABNORMAL LOW (ref 1.7–2.4)

## 2020-05-07 LAB — BRAIN NATRIURETIC PEPTIDE: B Natriuretic Peptide: 113.4 pg/mL — ABNORMAL HIGH (ref 0.0–100.0)

## 2020-05-07 MED ORDER — CARVEDILOL 6.25 MG PO TABS
6.2500 mg | ORAL_TABLET | Freq: Two times a day (BID) | ORAL | Status: DC
  Administered 2020-05-07 – 2020-05-09 (×5): 6.25 mg via ORAL
  Filled 2020-05-07 (×5): qty 1

## 2020-05-07 MED ORDER — MAGNESIUM SULFATE 2 GM/50ML IV SOLN
2.0000 g | Freq: Once | INTRAVENOUS | Status: AC
  Administered 2020-05-07: 2 g via INTRAVENOUS
  Filled 2020-05-07: qty 50

## 2020-05-07 NOTE — Progress Notes (Signed)
Pt up in chair since approx 0930 today when working with P.T. Tolerating sitting up well. Pt able to feed himself for breakfast and lunch after set up. He seems more awake and alert today. Watching a little television. More interactive.

## 2020-05-07 NOTE — Progress Notes (Addendum)
Neurology brief progress note  Today Mr. Memmott patient sitting up in chair awake and alert and interactive which is an improvement compared to yesterday's exam requiring loud verbal to engage and follows commands consistently.  He is an 79 year old gentleman with a history of Parkinson's Disease and dementia who presents to [facility] following two witnessed episodes of syncope ([id number] and 10Nov2021). MRI/MRA, CT and EEG are unrevealing. There is one remote episode of syncope due to a drop in blood pressure but the circumstances surrounding it are not well characterized. Based on the description of the present events, it is unlikely that orthostatic hypotension is driving these episodes. It is plausible that an autonomic component of non-motor manifestations of Parkinson's Disease is the cause. If concern for syncope recommend cardiac workup.  Please call for questions.  Marisue Humble, MD Page: [phone]

## 2020-05-07 NOTE — Progress Notes (Addendum)
PROGRESS NOTE                                                                                                                                                                                                             Patient Demographics:    [name], is a 79 y.o. male, DOB - 05/16/1941, ZOX:[id number]  Outpatient Primary MD for the patient is Plotnikov, Georgina QuintAleksei V, MD    LOS - 3  Admit date - 05/03/2020    Chief Complaint  Patient presents with  . Altered Mental Status       Brief Narrative (HPI from H&P) - [name] is an 79 y.o. male with a history of Parkinson's disease, dementia, increasing falls at home, covid-19 vaccinations, and BPH who presented to the ED from home after 2 episodes of loss of consciousness. He has had altered mental status since arrival, so history is limited. In the ED he was hypertensive to 169/91, afebrile without respiratory distress or hypoxia. Pancytopenia (ANC 1.1k, plt 105k, hgb 10.8g/dl) and hypokalemia were noted on labs. No bacteriuria on UA or infiltrate on CXR, though screening SARS-CoV-2 PCR was positive. Subsequent inflammatory markers have been negative. He was admitted for syncope evaluation with work up pending including CT head, echo, and EEG. An age-indeterminate infarct of the posterior left cerebellum was noted on CT for which MRI is ordered.    Subjective:    Patient in bed, appears comfortable, denies any headache, no fever, no chest pain or pressure, no shortness of breath , no abdominal pain. No focal weakness.    Assessment  & Plan :   Left posterior cerebellum infarct: Age-indeterminate on CT head -  this is a old stroke, MRI MRI does not show any acute changes, so far stable echo, EEG, carotid ultrasound, TEE, stable A1c, LDL around 90 for which trial of Crestor will be done VS allergy with atorvastatin, continue aspirin.  Continue telemetry monitor.  PT OT and  further work-up per neurology. Case discussed with Dr. Thomasena Edisollins on 05/06/2020.  Altered mental status on chronic dementia, history of Parkinson's: Likely metabolic encephalopathy, No CVA, EEG stable, ? if Parkinson's-related. Currently mentation has improved now following commands, continue supportive care.  Avoid benzodiazepines and narcotics.  Haldol as needed if needed for delirium. PT to work with him.  Syncope: Had recurrent episode  while admitted and on telemetry on 11/12. This demonstrated no seizure-like activity or postictal period.  Echo and EEG stable, cardiology following.  Could have autonomic instability due to underlying Parkinson's, monitor orthostatics, PT OT.  Also on trazodone, Cardura and Exelon patch, question if any of these medications could be related.  Covid-19 infection: Incidental on admission screening PCR in vaccinated patient [employer] 3/25, 4/19) with no infiltrate on CXR, no inflammatory marker elevation, and no infectious or pulmonary symptoms. Received bamlanivimab/etesevimab 11/11.  Folic acid deficiency:  Started supplementation.   Pancytopenia: Likely related to covid and anemia of chronic disease. Monitor.   Stage IIIa CKD: UA with no proteinuria, but with hyaline casts,  Hold torsemide for now,  Avoid nephrotoxins.  Baseline creatinine appears to be between 1.5-1.8.  Parkinson's disease: Followed by Dr. Karel Jarvis. - Continue sinemet, rivastigmine.  Hypmagnesemia: - Replaced.  BPH: Patient if he had orthostatic hypotension when he passed out, orthostatics are still pending, for now continue to hold Cardura.  Hypertension.  Placed on Coreg will monitor.      Condition -   Guarded  Family Communication  :  Wife [phone] on 05/06/20, 05/07/20  Code Status :  Full  Consults  :  Neuro  Procedures  :     MRI -   No acute intracranial finding. 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 Moderate chronic small-vessel ischemic  changes of the cerebral hemispheric deep white matter. Generalized atrophy, temporal lobe predominant. 1  MRA -   MRA head & Neck : 1. No large or medium vessel occlusion or correctable proximal stenosis. 2. MRA neck: Allowing for noncontrast technique, there is no visible carotid bifurcation stenosis on the right. Left carotid bifurcation cannot be seen because of artifact. Antegrade flow is present in both vertebral arteries.    CT - A small infarct is questioned within the posterior left cerebellar hemisphere, new from the prior head CT of 02/16/2019 but otherwise age indeterminate. Moderate cerebral atrophy and chronic small vessel ischemic disease. Mild ethmoid and maxillary sinus mucosal thickening.  TTE - 1. Left ventricular ejection fraction, by estimation, is 55 to 60%. The left ventricle has normal function. There is mild left ventricular hypertrophy. Left ventricular diastolic parameters are consistent with Grade I diastolic dysfunction (impaired  relaxation).  2. Right ventricular systolic function is normal. The right ventricular size is normal. Tricuspid regurgitation signal is inadequate for assessing PA pressure.  3. Left atrial size was mildly dilated.  4. The mitral valve is normal in structure. No evidence of mitral valve regurgitation. No evidence of mitral stenosis.  5. The aortic valve was not well visualized. Aortic valve regurgitation is trivial. No aortic stenosis is present.  6. Technically difficult study with poor acoustic windows.   Carotid US - 1-39% stenosis bilaterally.  EEG -  Continuousslow, generalized. This study is suggestive of mild diffuse encephalopathy, nonspecific etiology.No seizures or epileptiform discharges were seen throughout the recording.   PUD Prophylaxis :   Disposition Plan  :    Status is: Inpatient  Remains inpatient appropriate because:IV treatments appropriate due to intensity of illness or inability to take PO   Dispo:  The patient is from: Home              Anticipated d/c is to: SNF              Anticipated d/c date is: 3 days              Patient currently  is medically stable to d/c.   DVT Prophylaxis  :  Lovenox   Lab Results  Component Value Date   PLT 96 (L) 05/07/2020    Diet :  Diet Order            Diet regular Room service appropriate? Yes; Fluid consistency: Thin  Diet effective now                  Inpatient Medications  Scheduled Meds: . aspirin  325 mg Oral Daily  . B-complex with vitamin C  1 tablet Oral Daily  . carbidopa-levodopa  1 tablet Oral TID WC  . carvedilol  6.25 mg Oral BID WC  . cholecalciferol  1,000 Units Oral Daily  . enoxaparin (LOVENOX) injection  40 mg Subcutaneous Q24H  . rivastigmine  1.5 mg Oral BID  . rosuvastatin  10 mg Oral Daily  . traZODone  100 mg Oral QHS  . valACYclovir  1,000 mg Oral BID   Continuous Infusions: . magnesium sulfate bolus IVPB 2 g (05/07/20 0810)   PRN Meds:.  Antibiotics  :    Anti-infectives (From admission, onward)   Start     Dose/Rate Route Frequency Ordered Stop   05/06/20 1000  valACYclovir (VALTREX) tablet 1,000 mg        1,000 mg Oral 2 times daily 05/06/20 0934         Time Spent in minutes  30   Susa Raring M.D on 05/07/2020 at 8:43 AM  To page go to [email or web address] - password [facility]  [facility] -  Office  [phone]  See all Orders from today for further details    Objective:   Vitals:   05/06/20 2143 05/06/20 2345 05/07/20 0345 05/07/20 0815  BP:    (!) 185/98  Pulse:    75  Resp: 19   14  Temp: 98 F (36.7 C) 97.8 F (36.6 C) 98.3 F (36.8 C)   TempSrc: Oral Oral Oral   SpO2: 97%   97%  Weight:      Height:        Wt Readings from Last 3 Encounters:  05/04/20 77.5 kg  04/13/20 77.5 kg  01/17/20 78.9 kg     Intake/Output Summary (Last 24 hours) at 05/07/2020 0843 Last data filed at 05/06/2020 1700 Gross per 24 hour  Intake 520 ml  Output 500 ml  Net 20 ml      Physical Exam  Awake Alert, No new F.N deficits, Normal affect [place].AT,PERRAL Supple Neck,No JVD, No cervical lymphadenopathy appriciated.  Symmetrical Chest wall movement, Good air movement bilaterally, CTAB RRR,No Gallops, Rubs or new Murmurs, No Parasternal Heave +ve B.Sounds, Abd Soft, No tenderness, No organomegaly appriciated, No rebound - guarding or rigidity. No Cyanosis, Clubbing or edema, No new Rash or bruise     Data Review:    CBC Recent Labs  Lab 05/03/20 1308 05/04/20 0354 05/05/20 0401 05/06/20 0624 05/07/20 0106  WBC 1.9* 2.7* 2.4* 2.6* 4.6  HGB 10.8* 11.4* 11.2* 10.6* 10.4*  HCT 31.3* 32.6* 31.4* 29.9* 29.7*  PLT 105* 115* 101* 86* 96*  MCV 88.7 87.6 85.6 85.2 86.3  MCH 30.6 30.6 30.5 30.2 30.2  MCHC 34.5 35.0 35.7 35.5 35.0  RDW 11.8 11.5 11.6 11.6 11.5  LYMPHSABS 0.5* 1.0 0.6*  --  1.0  MONOABS 0.3 0.3 0.3  --  0.4  EOSABS 0.0 0.0 0.0  --  0.0  BASOSABS 0.0 0.0 0.0  --  0.0    Recent  Labs  Lab 05/03/20 1308 05/03/20 1308 05/03/20 1632 05/04/20 0354 05/05/20 0401 05/06/20 0624 05/07/20 0106  NA 141   < > 139 135 137 137 139  K 3.3*   < > 3.7 3.0* 3.3* 3.5 3.9  CL 110   < > 105 102 105 107 109  CO2 24   < > 27 24 26 22 25   GLUCOSE 108*   < > 112* 102* 106* 93 101*  BUN 20   < > 21 18 18 18 17   CREATININE 1.48*   < > 1.71* 1.51* 1.56* 1.57* 1.70*  CALCIUM 6.8*   < > 8.2* 8.1* 8.3* 8.0* 8.1*  AST 12*  --  13*  --   --   --  14*  ALT <5  --  <5  --   --   --  <5  ALKPHOS 52  --  66  --   --   --  51  BILITOT 0.9  --  1.0  --   --   --  1.2  ALBUMIN 2.7*  --  3.5  --   --   --  2.6*  MG  --   --   --  1.7 2.0  --  1.5*  CRP  --   --  0.6  --   --   --  8.0*  DDIMER  --   --  0.96*  --   --   --  0.60*  PROCALCITON  --   --  <0.10  --   --   --   --   LATICACIDVEN  --   --  1.1  --   --   --   --   HGBA1C  --   --   --   --  5.4  --   --   BNP  --   --   --   --   --   --  113.4*   < > = values in this interval not displayed.     ------------------------------------------------------------------------------------------------------------------ Recent Labs    05/05/20 0401  CHOL 139  HDL 39*  LDLCALC 90  TRIG 52  CHOLHDL 3.6    Lab Results  Component Value Date   HGBA1C 5.4 05/05/2020   ------------------------------------------------------------------------------------------------------------------ No results for input(s): TSH, T4TOTAL, T3FREE, THYROIDAB in the last 72 hours.  Invalid input(s): FREET3  Cardiac Enzymes No results for input(s): CKMB, TROPONINI, MYOGLOBIN in the last 168 hours.  Invalid input(s): CK ------------------------------------------------------------------------------------------------------------------    Component Value Date/Time   BNP 113.4 (H) 05/07/2020 0106    Micro Results Recent Results (from the past 240 hour(s))  Respiratory Panel by RT PCR (Flu A&B, Covid) - Nasopharyngeal Swab     Status: Abnormal   Collection Time: 05/03/20  1:13 PM   Specimen: Nasopharyngeal Swab  Result Value Ref Range Status   SARS Coronavirus 2 by RT PCR POSITIVE (A) NEGATIVE Final    Comment: RESULT CALLED TO, READ [name] BY AND VERIFIED WITH: FRANKLIN,C. RN @1510  05/03/20 BILLINGSLEY,L    Influenza A by PCR NEGATIVE NEGATIVE Final   Influenza B by PCR NEGATIVE NEGATIVE Final    Comment: Performed at [facility], [address], [place], [place] [address]  Culture, blood (single)     Status: None (Preliminary result)   Collection Time: 05/03/20  4:32 PM   Specimen: BLOOD RIGHT FOREARM  Result Value Ref Range Status   Specimen Description   Final    BLOOD  RIGHT FOREARM Performed at [facility], [address] [place], [place] [address]    Special Requests   Final    BOTTLES DRAWN AEROBIC AND ANAEROBIC Blood Culture adequate volume Performed at [facility], [address], [place], [place] [address]    Culture   Final     NO GROWTH 3 DAYS Performed at [facility], [address] [place], [place] [address]    Report Status PENDING  Incomplete    Radiology Reports EEG  Result Date: 05/05/2020 Charlsie Quest, MD     05/05/2020  9:30 AM Patient Name: RANDALE CARVALHO MRN: [id number] Epilepsy Attending: Charlsie Quest Referring Physician/Provider: Dr Albertine Grates Date: 05/04/2020 Duration: 23.26 minutes Patient history: 79 year old male with episodes of syncope x2.  EEG to evaluate for seizures. Level of alertness: Awake, drowsy, sleep, comatose, lethargic AEDs during EEG study: None Technical aspects: This EEG study was done with scalp electrodes positioned according to the 10-20 International system of electrode placement. Electrical activity was acquired at a sampling rate of 500Hz  and reviewed with a high frequency filter of 70Hz  and a low frequency filter of 1Hz . EEG data were recorded continuously and digitally stored. Description: The posterior dominant rhythm consists of 8 Hz activity of moderate voltage (25-35 uV) seen predominantly in posterior head regions, symmetric and reactive to eye opening and eye closing. EEG showed continuous generalized 5 to 6 Hz theta as well as intermittent 2 to 3 Hz delta slowing. Hyperventilation and photic stimulation were not performed.   ABNORMALITY -Continuous slow, generalized IMPRESSION: This study is suggestive of mild diffuse encephalopathy, nonspecific etiology. No seizures or epileptiform discharges were seen throughout the recording. Charlsie Quest   DG Chest 2 View  Result Date: 05/03/2020 CLINICAL DATA:  79 year old male with a history of syncope EXAM: CHEST - 2 VIEW COMPARISON:  07/03/2019 FINDINGS: Cardiomediastinal silhouette likely unchanged in size and contour given the positioning. No pneumothorax. No pleural effusion. Coarsened interstitial markings bilateral lungs without confluent airspace disease. No acute displaced fracture. Degenerative changes of  the visualized spine. IMPRESSION: Negative for acute cardiopulmonary disease Electronically Signed   By: Gilmer Mor D.O.   On: 05/03/2020 13:50   CT Head Wo Contrast  Result Date: 05/03/2020 CLINICAL DATA:  Mental status change, unknown cause. EXAM: CT HEAD WITHOUT CONTRAST TECHNIQUE: Contiguous axial images were obtained from the base of the skull through the vertex without intravenous contrast. COMPARISON:  Head CT 02/16/2019.  Brain MRI 11/23/2018. FINDINGS: Brain: Moderate cerebral atrophy. Moderate ill-defined hypoattenuation within the cerebral white matter is nonspecific, but compatible with chronic small vessel ischemic disease. A small infarct is questioned within the posterior left cerebellar hemisphere, new from the prior head CT of 02/16/2019 but otherwise age indeterminate (series 2, image 9) (series 5, image 56). There is no acute intracranial hemorrhage. No demarcated cortical infarct. No extra-axial fluid collection. No evidence of intracranial mass. No midline shift. Vascular: No hyperdense vessel.  Atherosclerotic calcifications. Skull: Normal. Negative for fracture or focal lesion. Sinuses/Orbits: Visualized orbits show no acute finding. Mild ethmoid and maxillary sinus mucosal thickening. IMPRESSION: A small infarct is questioned within the posterior left cerebellar hemisphere, new from the prior head CT of 02/16/2019 but otherwise age indeterminate. Moderate cerebral atrophy and chronic small vessel ischemic disease. Mild ethmoid and maxillary sinus mucosal thickening. Electronically Signed   By: Jackey Loge DO   On: 05/03/2020 19:26   MR ANGIO HEAD WO CONTRAST  Result Date:  05/06/2020 CLINICAL DATA:  Posterior cerebellar infarction, age indeterminate. EXAM: MRI HEAD WITHOUT CONTRAST MRA HEAD WITHOUT CONTRAST MRA NECK WITHOUT CONTRAST TECHNIQUE: Multiplanar, multiecho pulse sequences of the brain and surrounding structures were obtained without intravenous contrast. Angiographic  images of the Circle of Willis were obtained using MRA technique without intravenous contrast. Angiographic images of the neck were obtained using MRA technique without intravenous contrast. Carotid stenosis measurements (when applicable) are obtained utilizing NASCET criteria, using the distal internal carotid diameter as the denominator. COMPARISON:  Head CT 05/03/2020 FINDINGS: MRI HEAD FINDINGS Brain: Diffusion imaging does not show any acute or subacute infarction. There are old small vessel infarctions within the left posterior cerebellum. Mild chronic small-vessel change affects the pons. Cerebral hemispheres show generalized atrophy, temporal lobe predominant. There are moderate chronic small-vessel ischemic changes of the deep white matter. No large vessel territory cerebral hemispheric infarction. No mass lesion, hemorrhage, hydrocephalus or extra-axial collection. Vascular: Major vessels at the base of the brain show flow. Skull and upper cervical spine: Negative Sinuses/Orbits: Mild mucosal inflammatory changes of the sinuses. Orbits negative. Other: None MRA HEAD FINDINGS Both internal carotid arteries widely patent through the skull base and siphon regions. No siphon stenosis. The anterior and middle cerebral vessels are patent without proximal stenosis, aneurysm or vascular malformation. No large or medium vessel occlusion. Fenestrated left M1 segment. Both vertebral arteries are patent to the basilar. No basilar stenosis. Posterior circulation branch vessels appear normal. MRA NECK FINDINGS Limited resolution due to noncontrast technique. Both common carotid arteries show antegrade flow to the bifurcation regions. On the right, there is no apparent stenosis. On the left, there is too much artifact to evaluate the bifurcation region. Antegrade flow is present in both vertebral arteries. IMPRESSION: No acute intracranial finding. 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 Moderate chronic  small-vessel ischemic changes of the cerebral hemispheric deep white matter. Generalized atrophy, temporal lobe predominant. 1. MRA head: No large or medium vessel occlusion or correctable proximal stenosis. 2. MRA neck: Allowing for noncontrast technique, there is no visible carotid bifurcation stenosis on the right. Left carotid bifurcation cannot be seen because of artifact. Antegrade flow is present in both vertebral arteries. Electronically Signed   By: Paulina Fusi M.D.   On: 05/06/2020 13:56   MR ANGIO NECK WO CONTRAST  Result Date: 05/06/2020 CLINICAL DATA:  Posterior cerebellar infarction, age indeterminate. EXAM: MRI HEAD WITHOUT CONTRAST MRA HEAD WITHOUT CONTRAST MRA NECK WITHOUT CONTRAST TECHNIQUE: Multiplanar, multiecho pulse sequences of the brain and surrounding structures were obtained without intravenous contrast. Angiographic images of the Circle of Willis were obtained using MRA technique without intravenous contrast. Angiographic images of the neck were obtained using MRA technique without intravenous contrast. Carotid stenosis measurements (when applicable) are obtained utilizing NASCET criteria, using the distal internal carotid diameter as the denominator. COMPARISON:  Head CT 05/03/2020 FINDINGS: MRI HEAD FINDINGS Brain: Diffusion imaging does not show any acute or subacute infarction. There are old small vessel infarctions within the left posterior cerebellum. Mild chronic small-vessel change affects the pons. Cerebral hemispheres show generalized atrophy, temporal lobe predominant. There are moderate chronic small-vessel ischemic changes of the deep white matter. No large vessel territory cerebral hemispheric infarction. No mass lesion, hemorrhage, hydrocephalus or extra-axial collection. Vascular: Major vessels at the base of the brain show flow. Skull and upper cervical spine: Negative Sinuses/Orbits: Mild mucosal inflammatory changes of the sinuses. Orbits negative. Other: None MRA  HEAD FINDINGS Both internal carotid arteries widely patent through the skull  base and siphon regions. No siphon stenosis. The anterior and middle cerebral vessels are patent without proximal stenosis, aneurysm or vascular malformation. No large or medium vessel occlusion. Fenestrated left M1 segment. Both vertebral arteries are patent to the basilar. No basilar stenosis. Posterior circulation branch vessels appear normal. MRA NECK FINDINGS Limited resolution due to noncontrast technique. Both common carotid arteries show antegrade flow to the bifurcation regions. On the right, there is no apparent stenosis. On the left, there is too much artifact to evaluate the bifurcation region. Antegrade flow is present in both vertebral arteries. IMPRESSION: No acute intracranial finding. 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 Moderate chronic small-vessel ischemic changes of the cerebral hemispheric deep white matter. Generalized atrophy, temporal lobe predominant. 1. MRA head: No large or medium vessel occlusion or correctable proximal stenosis. 2. MRA neck: Allowing for noncontrast technique, there is no visible carotid bifurcation stenosis on the right. Left carotid bifurcation cannot be seen because of artifact. Antegrade flow is present in both vertebral arteries. Electronically Signed   By: Paulina Fusi M.D.   On: 05/06/2020 13:56   MR BRAIN WO CONTRAST  Result Date: 05/06/2020 CLINICAL DATA:  Posterior cerebellar infarction, age indeterminate. EXAM: MRI HEAD WITHOUT CONTRAST MRA HEAD WITHOUT CONTRAST MRA NECK WITHOUT CONTRAST TECHNIQUE: Multiplanar, multiecho pulse sequences of the brain and surrounding structures were obtained without intravenous contrast. Angiographic images of the Circle of Willis were obtained using MRA technique without intravenous contrast. Angiographic images of the neck were obtained using MRA technique without intravenous contrast. Carotid stenosis measurements (when  applicable) are obtained utilizing NASCET criteria, using the distal internal carotid diameter as the denominator. COMPARISON:  Head CT 05/03/2020 FINDINGS: MRI HEAD FINDINGS Brain: Diffusion imaging does not show any acute or subacute infarction. There are old small vessel infarctions within the left posterior cerebellum. Mild chronic small-vessel change affects the pons. Cerebral hemispheres show generalized atrophy, temporal lobe predominant. There are moderate chronic small-vessel ischemic changes of the deep white matter. No large vessel territory cerebral hemispheric infarction. No mass lesion, hemorrhage, hydrocephalus or extra-axial collection. Vascular: Major vessels at the base of the brain show flow. Skull and upper cervical spine: Negative Sinuses/Orbits: Mild mucosal inflammatory changes of the sinuses. Orbits negative. Other: None MRA HEAD FINDINGS Both internal carotid arteries widely patent through the skull base and siphon regions. No siphon stenosis. The anterior and middle cerebral vessels are patent without proximal stenosis, aneurysm or vascular malformation. No large or medium vessel occlusion. Fenestrated left M1 segment. Both vertebral arteries are patent to the basilar. No basilar stenosis. Posterior circulation branch vessels appear normal. MRA NECK FINDINGS Limited resolution due to noncontrast technique. Both common carotid arteries show antegrade flow to the bifurcation regions. On the right, there is no apparent stenosis. On the left, there is too much artifact to evaluate the bifurcation region. Antegrade flow is present in both vertebral arteries. IMPRESSION: No acute intracranial finding. 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 Moderate chronic small-vessel ischemic changes of the cerebral hemispheric deep white matter. Generalized atrophy, temporal lobe predominant. 1. MRA head: No large or medium vessel occlusion or correctable proximal stenosis. 2. MRA neck:  Allowing for noncontrast technique, there is no visible carotid bifurcation stenosis on the right. Left carotid bifurcation cannot be seen because of artifact. Antegrade flow is present in both vertebral arteries. Electronically Signed   By: Paulina Fusi M.D.   On: 05/06/2020 13:56   VAS US CAROTID  Result Date: 05/06/2020 Carotid Arterial Duplex  Study Indications:       CVA. Risk Factors:      Hypertension. Limitations        Today's exam was limited due to the patient's respiratory                    variation. Comparison Study:  No prior studies. Performing Technologist: Chanda Busing RVT  Examination Guidelines: A complete evaluation includes B-mode imaging, spectral Doppler, color Doppler, and power Doppler as needed of all accessible portions of each vessel. Bilateral testing is considered an integral part of a complete examination. Limited examinations for reoccurring indications may be performed as noted.  Right Carotid Findings: +----------+--------+--------+--------+-----------------------+--------+           PSV cm/sEDV cm/sStenosisPlaque Description     Comments +----------+--------+--------+--------+-----------------------+--------+ CCA Prox  111     14              smooth and heterogenous         +----------+--------+--------+--------+-----------------------+--------+ CCA Distal78      14              smooth and heterogenous         +----------+--------+--------+--------+-----------------------+--------+ ICA Prox  97      16              smooth and heterogenous         +----------+--------+--------+--------+-----------------------+--------+ ICA Distal51      14                                     tortuous +----------+--------+--------+--------+-----------------------+--------+ ECA       224     26                                              +----------+--------+--------+--------+-----------------------+--------+  +----------+--------+-------+--------+-------------------+           PSV cm/sEDV cmsDescribeArm Pressure (mmHG) +----------+--------+-------+--------+-------------------+ [id number]                                        +----------+--------+-------+--------+-------------------+ +---------+--------+--+--------+--+---------+ VertebralPSV cm/s59EDV cm/s12Antegrade +---------+--------+--+--------+--+---------+  Left Carotid Findings: +----------+--------+--------+--------+-----------------------+--------+           PSV cm/sEDV cm/sStenosisPlaque Description     Comments +----------+--------+--------+--------+-----------------------+--------+ CCA Prox  107     14              smooth and heterogenous         +----------+--------+--------+--------+-----------------------+--------+ CCA Distal111     14              smooth and heterogenous         +----------+--------+--------+--------+-----------------------+--------+ ICA Prox  133     21              smooth and heterogenous         +----------+--------+--------+--------+-----------------------+--------+ ICA Distal88      24                                     tortuous +----------+--------+--------+--------+-----------------------+--------+ ECA       107     9                                               +----------+--------+--------+--------+-----------------------+--------+ +----------+--------+--------+--------+-------------------+  PSV cm/sEDV cm/sDescribeArm Pressure (mmHG) +----------+--------+--------+--------+-------------------+ Subclavian200                                         +----------+--------+--------+--------+-------------------+ +---------+--------+--+--------+--+---------+ VertebralPSV cm/s57EDV cm/s12Antegrade +---------+--------+--+--------+--+---------+   Summary: Right Carotid: Velocities in the right ICA are consistent with a 1-39% stenosis. Left Carotid:  Velocities in the left ICA are consistent with a 1-39% stenosis. Vertebrals: Bilateral vertebral arteries demonstrate antegrade flow. *See table(s) above for measurements and observations.  Electronically signed by Fabienne Bruns MD on 05/06/2020 at 11:49:43 AM.    Final    ECHOCARDIOGRAM LIMITED  Result Date: 05/04/2020    ECHOCARDIOGRAM LIMITED REPORT   Patient Name:   ANURAG SCARFO Date of Exam: 05/04/2020 Medical Rec #:  [id number]      Height:       68.0 in Accession #:    [id number]     Weight:       170.9 lb Date of Birth:  Dec 18, 1940       BSA:          1.911 m Patient Age:    79 years       BP:           180/84 mmHg Patient Gender: M              HR:           70 bpm. Exam Location:  Inpatient Procedure: Limited Echo, Cardiac Doppler and Color Doppler Indications:    Syncope  History:        Patient has no prior history of Echocardiogram examinations.                 COPD, Signs/Symptoms:Syncope; Risk Factors:Hypertension,                 Dyslipidemia and Former Smoker. Parkinsons Dementia, COVID+.  Sonographer:    Lavenia Atlas Referring Phys: Albertine Grates  Sonographer Comments: Technically challenging study due to limited acoustic windows. Image acquisition challenging due to COPD. IMPRESSIONS  1. Left ventricular ejection fraction, by estimation, is 55 to 60%. The left ventricle has normal function. There is mild left ventricular hypertrophy. Left ventricular diastolic parameters are consistent with Grade I diastolic dysfunction (impaired relaxation).  2. Right ventricular systolic function is normal. The right ventricular size is normal. Tricuspid regurgitation signal is inadequate for assessing PA pressure.  3. Left atrial size was mildly dilated.  4. The mitral valve is normal in structure. No evidence of mitral valve regurgitation. No evidence of mitral stenosis.  5. The aortic valve was not well visualized. Aortic valve regurgitation is trivial. No aortic stenosis is present.  6. Technically  difficult study with poor acoustic windows. FINDINGS  Left Ventricle: Left ventricular ejection fraction, by estimation, is 55 to 60%. The left ventricle has normal function. The left ventricular internal cavity size was normal in size. There is mild left ventricular hypertrophy. Left ventricular diastolic  parameters are consistent with Grade I diastolic dysfunction (impaired relaxation). Right Ventricle: The right ventricular size is normal. No increase in right ventricular wall thickness. Right ventricular systolic function is normal. Tricuspid regurgitation signal is inadequate for assessing PA pressure. Left Atrium: Left atrial size was mildly dilated. Right Atrium: Right atrial size was normal in size. Mitral Valve: The mitral valve is normal in structure. No evidence of mitral valve stenosis. Tricuspid Valve: The tricuspid valve is normal in  structure. Tricuspid valve regurgitation is not demonstrated. Aortic Valve: The aortic valve was not well visualized. Aortic valve regurgitation is trivial. No aortic stenosis is present. Pulmonic Valve: The pulmonic valve was not well visualized. Aorta: The aortic root is normal in size and structure. Venous: The inferior vena cava was not well visualized. IAS/Shunts: No atrial level shunt detected by color flow Doppler. LEFT VENTRICLE PLAX 2D LVIDd:         3.70 cm Diastology LVIDs:         2.60 cm LV e' medial:    4.79 cm/s LV PW:         1.40 cm LV E/e' medial:  9.3 LV IVS:        1.20 cm LV e' lateral:   5.77 cm/s                        LV E/e' lateral: 7.7  RIGHT VENTRICLE RV S prime:     7.07 cm/s MITRAL VALVE MV Area (PHT): 2.22 cm MV Decel Time: 342 msec MV E velocity: 44.60 cm/s MV A velocity: 62.60 cm/s MV E/A ratio:  0.71 Marca Ancona MD Electronically signed by Marca Ancona MD Signature Date/Time: 05/04/2020/3:45:20 PM    Final

## 2020-05-07 NOTE — Evaluation (Signed)
Physical Therapy Evaluation Patient Details Name: [name] MRN: [id number] DOB: 1940/09/15 Today's Date: 05/07/2020   History of Present Illness  79 y.o. male with a history of Parkinson's disease, dementia, increasing falls at home, HTN, covid-19 vaccinations, and BPH who presented to the ED from home after 2 episodes of loss of consciousness. He has had altered mental status since arrival, so history is limited.age-indeterminate infarct of the posterior left cerebellum was noted on CT. Covid +    Clinical Impression  Pt admitted with above diagnosis. PTA pt lived at home with his wife, ambulatory in the house and required assist for ADLs. On eval, he required +2 max assist bed mobility, +2 mod assist sit to stand, and +2 max assist SPT. Heavy posterior lean noted in sitting and standing. Pt will benefit from skilled PT to increase their independence and safety with mobility to allow discharge to the venue listed below.    Orthostatic Vitals                                    BP        HR Supine                   147/73       76 Sitting EOB           133/66       78 Standing                 114/62      85 Sitting in recliner  137/79      76     Follow Up Recommendations SNF;Supervision/Assistance - 24 hour    Equipment Recommendations  Other (comment) (defer to next venue)    Recommendations for Other Services       Precautions / Restrictions Precautions Precautions: Fall      Mobility  Bed Mobility Overal bed mobility: Needs Assistance Bed Mobility: Supine to Sit     Supine to sit: +2 for physical assistance;Max assist     General bed mobility comments: cues for sequencing, use of bed pad to scoot to EOB    Transfers Overall transfer level: Needs assistance Equipment used: Rolling walker (2 wheeled);None   Sit to Stand: Mod assist;+2 physical assistance;+2 safety/equipment Stand pivot transfers: Max assist;+2 physical assistance;+2 safety/equipment        General transfer comment: sit to stand with RW. Static stand with RW for standing BP. +2 mod/max to maintain stance. Heavy posterior lean. RW removed to perform SPT bed to recliner +2 HH/max assist.  Ambulation/Gait             General Gait Details: unable  Stairs            Wheelchair Mobility    Modified Rankin (Stroke Patients Only) Modified Rankin (Stroke Patients Only) Pre-Morbid Rankin Score: Moderate disability Modified Rankin: Severe disability     Balance Overall balance assessment: Needs assistance Sitting-balance support: Bilateral upper extremity supported;Feet supported Sitting balance-Leahy Scale: Poor   Postural control: Posterior lean Standing balance support: During functional activity;Bilateral upper extremity supported Standing balance-Leahy Scale: Poor                               Pertinent Vitals/Pain Pain Assessment: No/denies pain    Home Living Family/patient expects to be discharged to:: Skilled nursing facility  Living Arrangements: Spouse/significant other               Additional Comments: Patient unable to answer PLOF questions due to dementia.    Prior Function Level of Independence: Needs assistance   Gait / Transfers Assistance Needed: Per chart has had recent falls at home. Per RN, wife reports pt ambulatory in the house.  ADL's / Homemaking Assistance Needed: Needs assistance for spouse - unknown how much assistance.        Hand Dominance   Dominant Hand: Right    Extremity/Trunk Assessment   Upper Extremity Assessment Upper Extremity Assessment: Defer to OT evaluation    Lower Extremity Assessment Lower Extremity Assessment: Generalized weakness    Cervical / Trunk Assessment Cervical / Trunk Assessment: Kyphotic  Communication      Cognition Arousal/Alertness: Awake/alert Behavior During Therapy: Anxious;Impulsive Overall Cognitive Status: No family/caregiver present to determine  baseline cognitive functioning                                 General Comments: Oriented to self only. Follows one-step commands inconsistently and with increased time. Poor safety awareness. Verbal and interacting with therapist. Eyes closed throughout session.      General Comments General comments (skin integrity, edema, etc.): SpO2 96% on RA    Exercises     Assessment/Plan    PT Assessment Patient needs continued PT services  PT Problem List Decreased strength;Decreased mobility;Decreased safety awareness;Decreased coordination;Decreased activity tolerance;Decreased balance;Cardiopulmonary status limiting activity       PT Treatment Interventions Therapeutic activities;Therapeutic exercise;Gait training;Patient/family education;Balance training;Functional mobility training    PT Goals (Current goals can be found in the Care Plan section)  Acute Rehab PT Goals Patient Stated Goal: not stated PT Goal Formulation: Patient unable to participate in goal setting Time For Goal Achievement: 05/21/20 Potential to Achieve Goals: Fair    Frequency Min 2X/week   Barriers to discharge        Co-evaluation               AM-PAC PT "6 Clicks" Mobility  Outcome Measure Help needed turning from your back to your side while in a flat bed without using bedrails?: A Lot Help needed moving from lying on your back to sitting on the side of a flat bed without using bedrails?: A Lot Help needed moving to and from a bed to a chair (including a wheelchair)?: A Lot Help needed standing up from a chair using your arms (e.g., wheelchair or bedside chair)?: A Lot Help needed to walk in hospital room?: Total Help needed climbing 3-5 steps with a railing? : Total 6 Click Score: 10    End of Session Equipment Utilized During Treatment: Gait belt Activity Tolerance: Patient tolerated treatment well Patient left: in chair;with chair alarm set;with call bell/phone within  reach Nurse Communication: Mobility status PT Visit Diagnosis: Other abnormalities of gait and mobility (R26.89);Difficulty in walking, not elsewhere classified (R26.2);Muscle weakness (generalized) (M62.81);History of falling (Z91.81)    Time: 8366-2947 PT Time Calculation (min) (ACUTE ONLY): 31 min   Charges:   PT Evaluation $PT Eval Moderate Complexity: 1 Mod PT Treatments $Therapeutic Activity: 8-22 mins        Aida Raider, PT  Office # [phone] Pager [phone]   Ilda Foil 05/07/2020, 9:49 AM

## 2020-05-07 NOTE — Progress Notes (Signed)
Evening scheduled meds given without difficulty. Dinner tray set up and assisted patient with eating. While in the room, his wife [name] called and spoke with patient for a few minutes. He was answering appropriately. Spouse was updated on patients progress today

## 2020-05-07 NOTE — NC FL2 (Signed)
[place] MEDICAID FL2 LEVEL OF CARE SCREENING TOOL     IDENTIFICATION  Patient Name: [name] Birthdate: [birth year] Sex: male Admission Date (Current Location): 05/03/2020  [facility] and [place] Number:  Producer, television/film/video and Address:  [facility] [place], [address] [place], [place] [address]      Provider Number: [id number]  Attending Physician Name and Address:  Leroy Sea, MD  Relative Name and Phone Number:       Current Level of Care: Hospital Recommended Level of Care: Skilled Nursing Facility Prior Approval Number:    Date Approved/Denied:   PASRR Number: [id number]  Discharge Plan: SNF    Current Diagnoses: Patient Active Problem List   Diagnosis Date Noted  . AMS (altered mental status) 05/04/2020  . Syncope 05/03/2020  . Neutropenia (HCC) 05/03/2020  . Thrombocytopenia (HCC) 05/03/2020  . COVID-19 virus infection 05/03/2020  . Shingles 11/17/2019  . Hydronephrosis with renal and ureteral calculus obstruction 07/06/2019  . Constipation 07/03/2019  . Acute metabolic encephalopathy 07/03/2019  . CRF (chronic renal failure), stage 3 (moderate) 03/18/2019  . Pressure injury of skin 02/14/2019  . ARF (acute renal failure) (HCC) 02/13/2019  . Sepsis (HCC) 02/13/2019  . Protein-calorie malnutrition, severe (HCC) 02/13/2019  . 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 Parkinson's syndrome (HCC) 02/13/2019  . Infected sebaceous cyst 12/04/2018  . Hallucinations 11/19/2018  . Shoulder pain 11/19/2018  . Vitamin B12 deficiency 11/19/2018  . 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 Memory loss 09/21/2018  . Falls frequently 09/21/2018  . Fatigue 10/27/2017  . Edema 07/29/2017  . Chronic venous insufficiency 06/30/2017  . Cellulitis and abscess of leg, except foot 06/30/2017  . Ear pain, referred, right 06/10/2016  . Urinary frequency 04/16/2016  . Cerumen impaction  08/22/2015  . Infection of right hand due to bite 04/20/2014  . Hemifacial spasm 09/15/2013  . Well adult exam 07/16/2013  . Dermatochalasis of eyelid 01/01/2012  . Other facial nerve disorders 01/01/2012  . 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 Twitching 11/04/2011  . Non-REM Parasomnia 10/14/2011  . Dog bite of left hand 08/19/2011  . Wound infection 08/19/2011  . Hypokalemia 12/10/2010  . Hyperglycemia 12/10/2010  . Dyslipidemia   . Benign prostatic hyperplasia   . LEG CRAMPS 03/07/2009  . OTITIS EXTERNA 08/30/2008  . Prostatitis 08/30/2008  . Essential hypertension 08/26/2007  . ERECTILE DYSFUNCTION 08/26/2007    Orientation RESPIRATION BLADDER Height & Weight     Self, Time, Situation, Place  Normal Incontinent Weight: 170 lb 13.7 oz (77.5 kg) Height:  5\' 8"  (172.7 cm)  BEHAVIORAL SYMPTOMS/MOOD NEUROLOGICAL BOWEL NUTRITION STATUS      Incontinent Diet (See discharge summay)  AMBULATORY STATUS COMMUNICATION OF NEEDS Skin   Extensive Assist Verbally Normal                       Personal Care Assistance Level of Assistance  Bathing, Feeding, Dressing Bathing Assistance: Maximum assistance Feeding assistance: Maximum assistance Dressing Assistance: Maximum assistance     Functional Limitations Info  Sight, Hearing, Speech Sight Info: Adequate Hearing Info: Adequate Speech Info: Adequate    SPECIAL CARE FACTORS FREQUENCY  PT (By licensed PT), OT (By licensed OT)     PT Frequency: 5x a week OT Frequency: 5x a week            Contractures Contractures Info: Not present    Additional Factors Info  Code  Status, Allergies Code Status Info: DNR Allergies Info: Amlodipine Atorvastatin Lorazepam Penicillins Sulfonamide Derivatives Telmisartan Sulfa Antibiotics           Current Medications (05/07/2020):  This is the current hospital active medication list Current Facility-Administered Medications  Medication Dose Route Frequency Provider Last Rate Last Admin   . aspirin tablet 325 mg  325 mg Oral Daily Tyrone Nine, MD   325 mg at 05/07/20 7893  . B-complex with vitamin C tablet 1 tablet  1 tablet Oral Daily Tyrone Nine, MD   1 tablet at 05/07/20 [phone]  . carbidopa-levodopa (SINEMET IR) 25-100 MG per tablet immediate release 1 tablet  1 tablet Oral TID WC Tyrone Nine, MD   1 tablet at 05/07/20 0811  . carvedilol (COREG) tablet 6.25 mg  6.25 mg Oral BID WC Leroy Sea, MD   6.25 mg at 05/07/20 0945  . cholecalciferol (VITAMIN D3) tablet 1,000 Units  1,000 Units Oral Daily Tyrone Nine, MD   1,000 Units at 05/07/20 [phone]  . enoxaparin (LOVENOX) injection 40 mg  40 mg Subcutaneous Q24H Tyrone Nine, MD   40 mg at 05/07/20 2585  . rivastigmine (EXELON) capsule 1.5 mg  1.5 mg Oral BID Tyrone Nine, MD   1.5 mg at 05/07/20 2778  . rosuvastatin (CRESTOR) tablet 10 mg  10 mg Oral Daily Leroy Sea, MD   10 mg at 05/07/20 0938  . traZODone (DESYREL) tablet 100 mg  100 mg Oral QHS Tyrone Nine, MD   100 mg at 05/06/20 2145  . valACYclovir (VALTREX) tablet 1,000 mg  1,000 mg Oral BID Leroy Sea, MD   1,000 mg at 05/07/20 2423     Discharge Medications: Please see discharge summary for a list of discharge medications.  Relevant Imaging Results:  Relevant Lab Results:   Additional Information [facility] [address] [place]

## 2020-05-07 NOTE — TOC Initial Note (Signed)
Transition of Care [facility]) - Initial/Assessment Note    Patient Details  Name: [name] MRN: [id number] Date of Birth: [birth year]  Transition of Care [facility]) CM/SW Contact:    Jimmy Picket, LCSWA Phone Number: 05/07/2020, 9:35 AM  Clinical Narrative:                 CSW spoke to pts wife [name] via phone. [name] stated pt was living at home with her. [name] stated pt used a walker for mobility and she assisted pt with ADL's. [name] stated pt was receiving HHPT.  CSW reviewed pt/ot reccs with [name]. [facility] is in agreeance with SNF. [name] stated she is only interested in pt going to [facility] because he has been there in the past. [name] declined csw faxing out to other facilities.   TOC will follow.  Expected Discharge Plan: Skilled Nursing Facility Barriers to Discharge: Continued Medical Work up   Patient Goals and CMS Choice Patient states their goals for this hospitalization and ongoing recovery are:: Pt was unable to verbalize goals CMS Medicare.gov Compare Post Acute Care list provided to:: Patient Choice offered to / list presented to : Spouse  Expected Discharge Plan and Services Expected Discharge Plan: Skilled Nursing Facility       Living arrangements for the past 2 months: Single Family Home                                      Prior Living Arrangements/Services Living arrangements for the past 2 months: Single Family Home Lives with:: Spouse Patient language and need for interpreter reviewed:: Yes Do you feel safe going back to the place where you live?: Yes      Need for Family Participation in Patient Care: Yes (Comment) Care giver support system in place?: Yes (comment) Current home services: DME, Home PT Criminal Activity/Legal Involvement Pertinent to Current Situation/Hospitalization: No - Comment as needed  Activities of Daily Living Home Assistive Devices/Equipment: Other (Comment) (pt unable to verbalize) ADL Screening  (condition at time of admission) Patient's cognitive ability adequate to safely complete daily activities?: No Is the patient deaf or have difficulty hearing?: No (per previous hx) Does the patient have difficulty seeing, even when wearing glasses/contacts?: No (per previous hx) Does the patient have difficulty concentrating, remembering, or making decisions?: Yes Patient able to express need for assistance with ADLs?: Yes Does the patient have difficulty dressing or bathing?: Yes Independently performs ADLs?: No (per previous hx) Communication: Independent Dressing (OT): Needs assistance Is this a change from baseline?: Pre-admission baseline Grooming: Needs assistance Is this a change from baseline?: Pre-admission baseline Feeding: Needs assistance Is this a change from baseline?: Pre-admission baseline Bathing: Needs assistance Is this a change from baseline?: Pre-admission baseline Toileting: Needs assistance Is this a change from baseline?: Pre-admission baseline In/Out Bed: Needs assistance Is this a change from baseline?: Pre-admission baseline Walks in Home: Needs assistance Is this a change from baseline?: Pre-admission baseline Does the patient have difficulty walking or climbing stairs?: No (per previous hx) Weakness of Legs: Both Weakness of Arms/Hands: Both  Permission Sought/Granted Permission sought to share information with : Family Supports, Oceanographer granted to share information with : Yes, Verbal Permission Granted  Share Information with NAME: Andrus Sharp  Permission granted to share info w AGENCY: SNF  Permission granted to share info w Relationship: Wife  Permission granted to share info w  Contact Information: [phone]  Emotional Assessment Appearance:: Appears stated age Attitude/Demeanor/Rapport: Unable to Assess Affect (typically observed): Unable to Assess Orientation: : Oriented to Self, Oriented to Place,  Oriented to  Time, Oriented to Situation Alcohol / Substance Use: Not Applicable Psych Involvement: No (comment)  Admission diagnosis:  Syncope and collapse [R55] Syncope [R55] COVID-19 [U07.1] AMS (altered mental status) [R41.82] Patient Active Problem List   Diagnosis Date Noted  . AMS (altered mental status) 05/04/2020  . Syncope 05/03/2020  . Neutropenia (HCC) 05/03/2020  . Thrombocytopenia (HCC) 05/03/2020  . COVID-19 virus infection 05/03/2020  . Shingles 11/17/2019  . Hydronephrosis with renal and ureteral calculus obstruction 07/06/2019  . Constipation 07/03/2019  . Acute metabolic encephalopathy 07/03/2019  . CRF (chronic renal failure), stage 3 (moderate) 03/18/2019  . Pressure injury of skin 02/14/2019  . ARF (acute renal failure) (HCC) 02/13/2019  . Sepsis (HCC) 02/13/2019  . Protein-calorie malnutrition, severe (HCC) 02/13/2019  . 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 Parkinson's syndrome (HCC) 02/13/2019  . Infected sebaceous cyst 12/04/2018  . Hallucinations 11/19/2018  . Shoulder pain 11/19/2018  . Vitamin B12 deficiency 11/19/2018  . 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 Memory loss 09/21/2018  . Falls frequently 09/21/2018  . Fatigue 10/27/2017  . Edema 07/29/2017  . Chronic venous insufficiency 06/30/2017  . Cellulitis and abscess of leg, except foot 06/30/2017  . Ear pain, referred, right 06/10/2016  . Urinary frequency 04/16/2016  . Cerumen impaction 08/22/2015  . Infection of right hand due to bite 04/20/2014  . Hemifacial spasm 09/15/2013  . Well adult exam 07/16/2013  . Dermatochalasis of eyelid 01/01/2012  . Other facial nerve disorders 01/01/2012  . 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 Twitching 11/04/2011  . Non-REM Parasomnia 10/14/2011  . Dog bite of left hand 08/19/2011  . Wound infection 08/19/2011  . Hypokalemia 12/10/2010  . Hyperglycemia 12/10/2010  . Dyslipidemia   . Benign  prostatic hyperplasia   . LEG CRAMPS 03/07/2009  . OTITIS EXTERNA 08/30/2008  . Prostatitis 08/30/2008  . Essential hypertension 08/26/2007  . ERECTILE DYSFUNCTION 08/26/2007   PCP:  [name], MD Pharmacy:   [facility] [address], [place] - [address] [place] [address] Phone: [phone] Fax: [phone]  [facility] - [place], [place] - [address] AT [facility] to [facility] [address] [place] [address] Phone: [phone] Fax: [phone]     Social Determinants of Health (SDOH) Interventions    Readmission Risk Interventions No flowsheet data found.  Jimmy Picket, Theresia Majors, [place] Clinical Social Worker [phone]

## 2020-05-07 NOTE — Progress Notes (Signed)
Got patient from chair to bed with assist x 2 and rolling walker. Pt needed constant cueing to move his feet forward. Pt had pulled his external catheter off and was incontinent of bowel. Incontinent care provided and pt put back to bed. New external catheter placed.

## 2020-05-08 DIAGNOSIS — F0280 Dementia in other diseases classified elsewhere without behavioral disturbance: Secondary | ICD-10-CM

## 2020-05-08 LAB — COMPREHENSIVE METABOLIC PANEL
ALT: <5 U/L (ref 0–44)
AST: 16 U/L (ref 15–41)
Albumin: 2.8 g/dL — ABNORMAL LOW (ref 3.5–5.0)
Alkaline Phosphatase: 51 U/L (ref 38–126)
Anion gap: 8 (ref 5–15)
BUN: 18 mg/dL (ref 8–23)
CO2: 24 mmol/L (ref 22–32)
Calcium: 8.6 mg/dL — ABNORMAL LOW (ref 8.9–10.3)
Chloride: 105 mmol/L (ref 98–111)
Creatinine, Ser: 1.49 mg/dL — ABNORMAL HIGH (ref 0.61–1.24)
GFR, Estimated: 47 mL/min — ABNORMAL LOW (ref >60)
Glucose, Bld: 104 mg/dL — ABNORMAL HIGH (ref 70–99)
Potassium: 3.8 mmol/L (ref 3.5–5.1)
Sodium: 137 mmol/L (ref 135–145)
Total Bilirubin: 1.2 mg/dL (ref 0.3–1.2)
Total Protein: 5.4 g/dL — ABNORMAL LOW (ref 6.5–8.1)

## 2020-05-08 LAB — BRAIN NATRIURETIC PEPTIDE: B Natriuretic Peptide: 116.8 pg/mL — ABNORMAL HIGH (ref 0.0–100.0)

## 2020-05-08 LAB — CBC WITH DIFFERENTIAL/PLATELET
Abs Immature Granulocytes: 0 10*3/uL (ref 0.00–0.07)
Basophils Absolute: 0 10*3/uL (ref 0.0–0.1)
Basophils Relative: 0 %
Eosinophils Absolute: 0.1 10*3/uL (ref 0.0–0.5)
Eosinophils Relative: 2 %
HCT: 31.9 % — ABNORMAL LOW (ref 39.0–52.0)
Hemoglobin: 11.2 g/dL — ABNORMAL LOW (ref 13.0–17.0)
Immature Granulocytes: 0 %
Lymphocytes Relative: 48 %
Lymphs Abs: 1.3 10*3/uL (ref 0.7–4.0)
MCH: 29.5 pg (ref 26.0–34.0)
MCHC: 35.1 g/dL (ref 30.0–36.0)
MCV: 83.9 fL (ref 80.0–100.0)
Monocytes Absolute: 0.3 10*3/uL (ref 0.1–1.0)
Monocytes Relative: 10 %
Neutro Abs: 1.1 10*3/uL — ABNORMAL LOW (ref 1.7–7.7)
Neutrophils Relative %: 40 %
Platelets: 105 10*3/uL — ABNORMAL LOW (ref 150–400)
RBC: 3.8 MIL/uL — ABNORMAL LOW (ref 4.22–5.81)
RDW: 11.3 % — ABNORMAL LOW (ref 11.5–15.5)
WBC: 2.7 10*3/uL — ABNORMAL LOW (ref 4.0–10.5)
nRBC: 0 % (ref 0.0–0.2)

## 2020-05-08 LAB — CULTURE, BLOOD (SINGLE)
Culture: NO GROWTH
Special Requests: ADEQUATE

## 2020-05-08 LAB — D-DIMER, QUANTITATIVE: D-Dimer, Quant: 0.76 ug/mL-FEU — ABNORMAL HIGH (ref 0.00–0.50)

## 2020-05-08 LAB — MAGNESIUM: Magnesium: 1.5 mg/dL — ABNORMAL LOW (ref 1.7–2.4)

## 2020-05-08 LAB — C-REACTIVE PROTEIN: CRP: 9.6 mg/dL — ABNORMAL HIGH (ref <1.0)

## 2020-05-08 MED ORDER — HYDRALAZINE HCL 20 MG/ML IJ SOLN
10.0000 mg | Freq: Four times a day (QID) | INTRAMUSCULAR | Status: DC | PRN
  Administered 2020-05-08: 10 mg via INTRAVENOUS
  Filled 2020-05-08: qty 1

## 2020-05-08 MED ORDER — LACTATED RINGERS IV SOLN: INTRAVENOUS | Status: AC

## 2020-05-08 MED ORDER — HYDRALAZINE HCL 50 MG PO TABS
50.0000 mg | ORAL_TABLET | Freq: Three times a day (TID) | ORAL | Status: DC
  Administered 2020-05-08 – 2020-05-09 (×3): 50 mg via ORAL
  Filled 2020-05-08 (×3): qty 1

## 2020-05-08 MED ORDER — ASPIRIN 81 MG PO CHEW
81.0000 mg | CHEWABLE_TABLET | Freq: Every day | ORAL | Status: DC
  Administered 2020-05-09: 81 mg via ORAL
  Filled 2020-05-08: qty 1

## 2020-05-08 MED ORDER — HALOPERIDOL LACTATE 5 MG/ML IJ SOLN: 2.0000 mg | Freq: Four times a day (QID) | INTRAMUSCULAR | Status: DC | PRN

## 2020-05-08 MED ORDER — MAGNESIUM SULFATE 4 GM/100ML IV SOLN
4.0000 g | Freq: Once | INTRAVENOUS | Status: AC
  Administered 2020-05-08: 4 g via INTRAVENOUS
  Filled 2020-05-08: qty 100

## 2020-05-08 NOTE — Progress Notes (Signed)
PROGRESS NOTE                                                                                                                                                                                                             Patient Demographics:    [name], is a 79 y.o. male, DOB - 1941-04-03, ONG:[id number]  Outpatient Primary MD for the patient is Plotnikov, Georgina Quint, MD    LOS - 4  Admit date - 05/03/2020    Chief Complaint  Patient presents with  . Altered Mental Status       Brief Narrative (HPI from H&P) - [name] is an 79 y.o. male with a history of Parkinson's disease, dementia, increasing falls at home, covid-19 vaccinations, and BPH who presented to the ED from home after 2 episodes of loss of consciousness. He has had altered mental status since arrival, so history is limited. In the ED he was hypertensive to 169/91, afebrile without respiratory distress or hypoxia. Pancytopenia (ANC 1.1k, plt 105k, hgb 10.8g/dl) and hypokalemia were noted on labs. No bacteriuria on UA or infiltrate on CXR, though screening SARS-CoV-2 PCR was positive. Subsequent inflammatory markers have been negative. He was admitted for syncope evaluation with work up pending including CT head, echo, and EEG. An age-indeterminate infarct of the posterior left cerebellum was noted on CT for which MRI is ordered.    Subjective:    Patient in bed, appears comfortable, denies any headache, no fever, no chest pain or pressure, no shortness of breath , no abdominal pain. No focal weakness.   Assessment  & Plan :   Left posterior cerebellum infarct: Age-indeterminate on CT head -  this is a old stroke, MRI MRI does not show any acute changes, so far stable echo, EEG, carotid ultrasound, TEE, stable A1c, LDL around 90 for which trial of Crestor will be done VS allergy with atorvastatin, continue aspirin.  Continue telemetry monitor.  PT OT and  further work-up per neurology. Case discussed with Dr. Thomasena Edis on 05/06/2020.  He will require SNF for generalized weakness and deconditioning.  Altered mental status on chronic dementia, history of Parkinson's: Likely metabolic encephalopathy, No CVA, EEG stable, ? if Parkinson's-related. Currently mentation has improved now following commands, continue supportive care.  Avoid benzodiazepines and narcotics.  Haldol as needed if needed for delirium. PT  to work with him.  Will need SNF.  Syncope: Had recurrent episode while admitted and on telemetry on 11/12. This demonstrated no seizure-like activity or postictal period.  Echo and EEG stable, cardiology following.  He was orthostatic and likely has developed autonomic instability due to Parkinson's, continue to hold Cardura, TED stockings provided and has been hydrated.  Will monitor.  Remains quite weak and require SNF.  Covid-19 infection: Incidental on admission screening PCR in vaccinated patient [employer] 3/25, 4/19) with no infiltrate on CXR, no inflammatory marker elevation, and no infectious or pulmonary symptoms. Received bamlanivimab/etesevimab 11/11.  Folic acid deficiency:  Started supplementation.   Pancytopenia: Likely related to covid and anemia of chronic disease. Monitor.   Stage IIIa CKD: UA with no proteinuria, but with hyaline casts,  Hold torsemide for now,  Avoid nephrotoxins.  Baseline creatinine appears to be between 1.5-1.8.  Parkinson's disease: Followed by Dr. Karel Jarvis. - Continue sinemet, rivastigmine.  Hypmagnesemia: - Replaced.  BPH: Patient if he had orthostatic hypotension when he passed out, orthostatics are still pending, for now continue to hold Cardura.  Hypertension.  On Coreg along with hydralazine, added IV hydralazine also added if needed, monitor.      Condition -   Guarded  Family Communication  :  Wife [phone] on 05/06/20, 05/07/20  Code Status :  Full  Consults  :   Neuro  Procedures  :     MRI -   No acute intracranial finding. 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 Moderate chronic small-vessel ischemic changes of the cerebral hemispheric deep white matter. Generalized atrophy, temporal lobe predominant. 1  MRA -   MRA head & Neck : 1. No large or medium vessel occlusion or correctable proximal stenosis. 2. MRA neck: Allowing for noncontrast technique, there is no visible carotid bifurcation stenosis on the right. Left carotid bifurcation cannot be seen because of artifact. Antegrade flow is present in both vertebral arteries.    CT - A small infarct is questioned within the posterior left cerebellar hemisphere, new from the prior head CT of 02/16/2019 but otherwise age indeterminate. Moderate cerebral atrophy and chronic small vessel ischemic disease. Mild ethmoid and maxillary sinus mucosal thickening.  TTE - 1. Left ventricular ejection fraction, by estimation, is 55 to 60%. The left ventricle has normal function. There is mild left ventricular hypertrophy. Left ventricular diastolic parameters are consistent with Grade I diastolic dysfunction (impaired  relaxation).  2. Right ventricular systolic function is normal. The right ventricular size is normal. Tricuspid regurgitation signal is inadequate for assessing PA pressure.  3. Left atrial size was mildly dilated.  4. The mitral valve is normal in structure. No evidence of mitral valve regurgitation. No evidence of mitral stenosis.  5. The aortic valve was not well visualized. Aortic valve regurgitation is trivial. No aortic stenosis is present.  6. Technically difficult study with poor acoustic windows.   Carotid US - 1-39% stenosis bilaterally.  EEG -  Continuousslow, generalized. This study is suggestive of mild diffuse encephalopathy, nonspecific etiology.No seizures or epileptiform discharges were seen throughout the recording.   PUD Prophylaxis :    Disposition Plan  :    Status is: Inpatient  Remains inpatient appropriate because:IV treatments appropriate due to intensity of illness or inability to take PO   Dispo: The patient is from: Home              Anticipated d/c is to: SNF  Anticipated d/c date is: 3 days              Patient currently is medically stable to d/c.   DVT Prophylaxis  :  Lovenox   Lab Results  Component Value Date   PLT 105 (L) 05/08/2020    Diet :  Diet Order            Diet regular Room service appropriate? Yes; Fluid consistency: Thin  Diet effective now                  Inpatient Medications  Scheduled Meds: . aspirin  325 mg Oral Daily  . B-complex with vitamin C  1 tablet Oral Daily  . carbidopa-levodopa  1 tablet Oral TID WC  . carvedilol  6.25 mg Oral BID WC  . cholecalciferol  1,000 Units Oral Daily  . enoxaparin (LOVENOX) injection  40 mg Subcutaneous Q24H  . hydrALAZINE  50 mg Oral Q8H  . rivastigmine  1.5 mg Oral BID  . rosuvastatin  10 mg Oral Daily  . traZODone  100 mg Oral QHS  . valACYclovir  1,000 mg Oral BID   Continuous Infusions: . lactated ringers 125 mL/hr at 05/08/20 0903  . magnesium sulfate bolus IVPB 4 g (05/08/20 0905)   PRN Meds:.  Antibiotics  :    Anti-infectives (From admission, onward)   Start     Dose/Rate Route Frequency Ordered Stop   05/06/20 1000  valACYclovir (VALTREX) tablet 1,000 mg        1,000 mg Oral 2 times daily 05/06/20 0934         Time Spent in minutes  30   Susa RaringPrashant Chanae Gemma M.D on 05/08/2020 at 10:24 AM  To page go to [email or web address] - password [email or web address]  [facility] -  Office  [phone]  See all Orders from today for further details    Objective:   Vitals:   05/07/20 2015 05/08/20 0030 05/08/20 0500 05/08/20 0815  BP:  (!) 159/93 (!) 191/89 (!) 194/93  Pulse:  77  71  Resp:  17  12  Temp: 98.3 F (36.8 C) 98.2 F (36.8 C) 98.6 F (37 C) 98.2 F (36.8 C)  TempSrc: Oral Oral Oral Oral  SpO2:   99%  97%  Weight:      Height:        Wt Readings from Last 3 Encounters:  05/04/20 77.5 kg  04/13/20 77.5 kg  01/17/20 78.9 kg     Intake/Output Summary (Last 24 hours) at 05/08/2020 1024 Last data filed at 05/08/2020 0500 Gross per 24 hour  Intake 840 ml  Output 2300 ml  Net -1460 ml     Physical Exam  Awake slightly sleepy early in the morning, No new F.N deficits,   [place].AT,PERRAL Supple Neck,No JVD, No cervical lymphadenopathy appriciated.  Symmetrical Chest wall movement, Good air movement bilaterally, CTAB RRR,No Gallops, Rubs or new Murmurs, No Parasternal Heave +ve B.Sounds, Abd Soft, No tenderness, No organomegaly appriciated, No rebound - guarding or rigidity. No Cyanosis, Clubbing or edema, No new Rash or bruise    Data Review:    CBC Recent Labs  Lab 05/03/20 1308 05/03/20 1308 05/04/20 0354 05/05/20 0401 05/06/20 0624 05/07/20 0106 05/08/20 0127  WBC 1.9*   < > 2.7* 2.4* 2.6* 4.6 2.7*  HGB 10.8*   < > 11.4* 11.2* 10.6* 10.4* 11.2*  HCT 31.3*   < > 32.6* 31.4* 29.9* 29.7* 31.9*  PLT 105*   < > 115* 101*  86* 96* 105*  MCV 88.7   < > 87.6 85.6 85.2 86.3 83.9  MCH 30.6   < > 30.6 30.5 30.2 30.2 29.5  MCHC 34.5   < > 35.0 35.7 35.5 35.0 35.1  RDW 11.8   < > 11.5 11.6 11.6 11.5 11.3*  LYMPHSABS 0.5*  --  1.0 0.6*  --  1.0 1.3  MONOABS 0.3  --  0.3 0.3  --  0.4 0.3  EOSABS 0.0  --  0.0 0.0  --  0.0 0.1  BASOSABS 0.0  --  0.0 0.0  --  0.0 0.0   < > = values in this interval not displayed.    Recent Labs  Lab 05/03/20 1308 05/03/20 1308 05/03/20 1632 05/03/20 1632 05/04/20 0354 05/05/20 0401 05/06/20 0624 05/07/20 0106 05/08/20 0127  NA 141   < > 139   < > 135 137 137 139 137  K 3.3*   < > 3.7   < > 3.0* 3.3* 3.5 3.9 3.8  CL 110   < > 105   < > 102 105 107 109 105  CO2 24   < > 27   < > GLUCOSE 108*   < > 112*   < > 102* 106* 93 101* 104*  BUN 20   < > 21   < > CREATININE 1.48*   < > 1.71*   < > 1.51*  1.56* 1.57* 1.70* 1.49*  CALCIUM 6.8*   < > 8.2*   < > 8.1* 8.3* 8.0* 8.1* 8.6*  AST 12*  --  13*  --   --   --   --  14* 16  ALT <5  --  <5  --   --   --   --  <5 <5  ALKPHOS 52  --  66  --   --   --   --  51 51  BILITOT 0.9  --  1.0  --   --   --   --  1.2 1.2  ALBUMIN 2.7*  --  3.5  --   --   --   --  2.6* 2.8*  MG  --   --   --   --  1.7 2.0  --  1.5* 1.5*  CRP  --   --  0.6  --   --   --   --  8.0* 9.6*  DDIMER  --   --  0.96*  --   --   --   --  0.60* 0.76*  PROCALCITON  --   --  <0.10  --   --   --   --   --   --   LATICACIDVEN  --   --  1.1  --   --   --   --   --   --   HGBA1C  --   --   --   --   --  5.4  --   --   --   BNP  --   --   --   --   --   --   --  113.4* 116.8*   < > = values in this interval not displayed.    ------------------------------------------------------------------------------------------------------------------ No results for input(s): CHOL, HDL, LDLCALC, TRIG, CHOLHDL, LDLDIRECT in the last 72 hours.  Lab Results  Component Value Date   HGBA1C 5.4 05/05/2020   ------------------------------------------------------------------------------------------------------------------ No  results for input(s): TSH, T4TOTAL, T3FREE, THYROIDAB in the last 72 hours.  Invalid input(s): FREET3  Cardiac Enzymes No results for input(s): CKMB, TROPONINI, MYOGLOBIN in the last 168 hours.  Invalid input(s): CK ------------------------------------------------------------------------------------------------------------------    Component Value Date/Time   BNP 116.8 (H) 05/08/2020 0127    Micro Results Recent Results (from the past 240 hour(s))  Respiratory Panel by RT PCR (Flu A&B, Covid) - Nasopharyngeal Swab     Status: Abnormal   Collection Time: 05/03/20  1:13 PM   Specimen: Nasopharyngeal Swab  Result Value Ref Range Status   SARS Coronavirus 2 by RT PCR POSITIVE (A) NEGATIVE Final    Comment: RESULT CALLED TO, READ BACK BY AND VERIFIED WITH: FRANKLIN,C.  RN @1510  05/03/20 BILLINGSLEY,L    Influenza A by PCR NEGATIVE NEGATIVE Final   Influenza B by PCR NEGATIVE NEGATIVE Final    Comment: Performed at [facility], [address], [place], [place] [address]  Culture, blood (single)     Status: None (Preliminary result)   Collection Time: 05/03/20  4:32 PM   Specimen: BLOOD RIGHT FOREARM  Result Value Ref Range Status   Specimen Description   Final    BLOOD RIGHT FOREARM Performed at [facility], [address], [place], [place] [address]    Special Requests   Final    BOTTLES DRAWN AEROBIC AND ANAEROBIC Blood Culture adequate volume Performed at [facility], [address] [place], [place] [address]    Culture   Final    NO GROWTH 4 DAYS Performed at [facility], [address], [place], [place] [address]    Report Status PENDING  Incomplete    Radiology Reports EEG  Result Date: 05/05/2020 Charlsie Quest, MD     05/05/2020  9:30 AM Patient Name: EWEL LONA MRN: [id number] Epilepsy Attending: Charlsie Quest Referring Physician/Provider: Dr Albertine Grates Date: 05/04/2020 Duration: 23.26 minutes Patient history: 79 year old male with episodes of syncope x2.  EEG to evaluate for seizures. Level of alertness: Awake, drowsy, sleep, comatose, lethargic AEDs during EEG study: None Technical aspects: This EEG study was done with scalp electrodes positioned according to the 10-20 International system of electrode placement. Electrical activity was acquired at a sampling rate of 500Hz  and reviewed with a high frequency filter of 70Hz  and a low frequency filter of 1Hz . EEG data were recorded continuously and digitally stored. Description: The posterior dominant rhythm consists of 8 Hz activity of moderate voltage (25-35 uV) seen predominantly in posterior head regions, symmetric and reactive to eye opening and eye closing. EEG showed continuous generalized 5 to 6 Hz theta as well as  intermittent 2 to 3 Hz delta slowing. Hyperventilation and photic stimulation were not performed.   ABNORMALITY -Continuous slow, generalized IMPRESSION: This study is suggestive of mild diffuse encephalopathy, nonspecific etiology. No seizures or epileptiform discharges were seen throughout the recording. Charlsie Quest   DG Chest 2 View  Result Date: 05/03/2020 CLINICAL DATA:  79 year old male with a history of syncope EXAM: CHEST - 2 VIEW COMPARISON:  07/03/2019 FINDINGS: Cardiomediastinal silhouette likely unchanged in size and contour given the positioning. No pneumothorax. No pleural effusion. Coarsened interstitial markings bilateral lungs without confluent airspace disease. No acute displaced fracture. Degenerative changes of the visualized spine. IMPRESSION: Negative for acute cardiopulmonary disease Electronically Signed   By: Gilmer Mor D.O.   On: 05/03/2020 13:50   CT Head Wo Contrast  Result Date: 05/03/2020 CLINICAL DATA:  Mental status change, unknown cause. EXAM: CT HEAD WITHOUT CONTRAST TECHNIQUE: Contiguous axial images were obtained from the base of the skull through the vertex without intravenous contrast. COMPARISON:  Head CT 02/16/2019.  Brain MRI 11/23/2018. FINDINGS: Brain: Moderate cerebral atrophy. Moderate ill-defined hypoattenuation within the cerebral white matter is nonspecific, but compatible with chronic small vessel ischemic disease. A small infarct is questioned within the posterior left cerebellar hemisphere, new from the prior head CT of 02/16/2019 but otherwise age indeterminate (series 2, image 9) (series 5, image 56). There is no acute intracranial hemorrhage. No demarcated cortical infarct. No extra-axial fluid collection. No evidence of intracranial mass. No midline shift. Vascular: No hyperdense vessel.  Atherosclerotic calcifications. Skull: Normal. Negative for fracture or focal lesion. Sinuses/Orbits: Visualized orbits show no acute finding. Mild ethmoid  and maxillary sinus mucosal thickening. IMPRESSION: A small infarct is questioned within the posterior left cerebellar hemisphere, new from the prior head CT of 02/16/2019 but otherwise age indeterminate. Moderate cerebral atrophy and chronic small vessel ischemic disease. Mild ethmoid and maxillary sinus mucosal thickening. Electronically Signed   By: Jackey Loge DO   On: 05/03/2020 19:26   MR ANGIO HEAD WO CONTRAST  Result Date: 05/06/2020 CLINICAL DATA:  Posterior cerebellar infarction, age indeterminate. EXAM: MRI HEAD WITHOUT CONTRAST MRA HEAD WITHOUT CONTRAST MRA NECK WITHOUT CONTRAST TECHNIQUE: Multiplanar, multiecho pulse sequences of the brain and surrounding structures were obtained without intravenous contrast. Angiographic images of the Circle of Willis were obtained using MRA technique without intravenous contrast. Angiographic images of the neck were obtained using MRA technique without intravenous contrast. Carotid stenosis measurements (when applicable) are obtained utilizing NASCET criteria, using the distal internal carotid diameter as the denominator. COMPARISON:  Head CT 05/03/2020 FINDINGS: MRI HEAD FINDINGS Brain: Diffusion imaging does not show any acute or subacute infarction. There are old small vessel infarctions within the left posterior cerebellum. Mild chronic small-vessel change affects the pons. Cerebral hemispheres show generalized atrophy, temporal lobe predominant. There are moderate chronic small-vessel ischemic changes of the deep white matter. No large vessel territory cerebral hemispheric infarction. No mass lesion, hemorrhage, hydrocephalus or extra-axial collection. Vascular: Major vessels at the base of the brain show flow. Skull and upper cervical spine: Negative Sinuses/Orbits: Mild mucosal inflammatory changes of the sinuses. Orbits negative. Other: None MRA HEAD FINDINGS Both internal carotid arteries widely patent through the skull base and siphon regions. No  siphon stenosis. The anterior and middle cerebral vessels are patent without proximal stenosis, aneurysm or vascular malformation. No large or medium vessel occlusion. Fenestrated left M1 segment. Both vertebral arteries are patent to the basilar. No basilar stenosis. Posterior circulation branch vessels appear normal. MRA NECK FINDINGS Limited resolution due to noncontrast technique. Both common carotid arteries show antegrade flow to the bifurcation regions. On the right, there is no apparent stenosis. On the left, there is too much artifact to evaluate the bifurcation region. Antegrade flow is present in both vertebral arteries. IMPRESSION: No acute intracranial finding. 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 Moderate chronic small-vessel ischemic changes of the cerebral hemispheric deep white matter. Generalized atrophy, temporal lobe predominant. 1. MRA head: No large or medium vessel occlusion or correctable proximal stenosis. 2. MRA neck: Allowing for noncontrast technique, there is no visible carotid bifurcation stenosis on the right. Left carotid bifurcation cannot be seen because of artifact. Antegrade flow is present in both vertebral arteries. Electronically Signed   By: Paulina Fusi M.D.   On: 05/06/2020 13:56   MR ANGIO  NECK WO CONTRAST  Result Date: 05/06/2020 CLINICAL DATA:  Posterior cerebellar infarction, age indeterminate. EXAM: MRI HEAD WITHOUT CONTRAST MRA HEAD WITHOUT CONTRAST MRA NECK WITHOUT CONTRAST TECHNIQUE: Multiplanar, multiecho pulse sequences of the brain and surrounding structures were obtained without intravenous contrast. Angiographic images of the Circle of Willis were obtained using MRA technique without intravenous contrast. Angiographic images of the neck were obtained using MRA technique without intravenous contrast. Carotid stenosis measurements (when applicable) are obtained utilizing NASCET criteria, using the distal internal carotid diameter as the  denominator. COMPARISON:  Head CT 05/03/2020 FINDINGS: MRI HEAD FINDINGS Brain: Diffusion imaging does not show any acute or subacute infarction. There are old small vessel infarctions within the left posterior cerebellum. Mild chronic small-vessel change affects the pons. Cerebral hemispheres show generalized atrophy, temporal lobe predominant. There are moderate chronic small-vessel ischemic changes of the deep white matter. No large vessel territory cerebral hemispheric infarction. No mass lesion, hemorrhage, hydrocephalus or extra-axial collection. Vascular: Major vessels at the base of the brain show flow. Skull and upper cervical spine: Negative Sinuses/Orbits: Mild mucosal inflammatory changes of the sinuses. Orbits negative. Other: None MRA HEAD FINDINGS Both internal carotid arteries widely patent through the skull base and siphon regions. No siphon stenosis. The anterior and middle cerebral vessels are patent without proximal stenosis, aneurysm or vascular malformation. No large or medium vessel occlusion. Fenestrated left M1 segment. Both vertebral arteries are patent to the basilar. No basilar stenosis. Posterior circulation branch vessels appear normal. MRA NECK FINDINGS Limited resolution due to noncontrast technique. Both common carotid arteries show antegrade flow to the bifurcation regions. On the right, there is no apparent stenosis. On the left, there is too much artifact to evaluate the bifurcation region. Antegrade flow is present in both vertebral arteries. IMPRESSION: No acute intracranial finding. 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 Moderate chronic small-vessel ischemic changes of the cerebral hemispheric deep white matter. Generalized atrophy, temporal lobe predominant. 1. MRA head: No large or medium vessel occlusion or correctable proximal stenosis. 2. MRA neck: Allowing for noncontrast technique, there is no visible carotid bifurcation stenosis on the right. Left  carotid bifurcation cannot be seen because of artifact. Antegrade flow is present in both vertebral arteries. Electronically Signed   By: Paulina Fusi M.D.   On: 05/06/2020 13:56   MR BRAIN WO CONTRAST  Result Date: 05/06/2020 CLINICAL DATA:  Posterior cerebellar infarction, age indeterminate. EXAM: MRI HEAD WITHOUT CONTRAST MRA HEAD WITHOUT CONTRAST MRA NECK WITHOUT CONTRAST TECHNIQUE: Multiplanar, multiecho pulse sequences of the brain and surrounding structures were obtained without intravenous contrast. Angiographic images of the Circle of Willis were obtained using MRA technique without intravenous contrast. Angiographic images of the neck were obtained using MRA technique without intravenous contrast. Carotid stenosis measurements (when applicable) are obtained utilizing NASCET criteria, using the distal internal carotid diameter as the denominator. COMPARISON:  Head CT 05/03/2020 FINDINGS: MRI HEAD FINDINGS Brain: Diffusion imaging does not show any acute or subacute infarction. There are old small vessel infarctions within the left posterior cerebellum. Mild chronic small-vessel change affects the pons. Cerebral hemispheres show generalized atrophy, temporal lobe predominant. There are moderate chronic small-vessel ischemic changes of the deep white matter. No large vessel territory cerebral hemispheric infarction. No mass lesion, hemorrhage, hydrocephalus or extra-axial collection. Vascular: Major vessels at the base of the brain show flow. Skull and upper cervical spine: Negative Sinuses/Orbits: Mild mucosal inflammatory changes of the sinuses. Orbits negative. Other: None MRA HEAD FINDINGS Both internal carotid arteries widely  patent through the skull base and siphon regions. No siphon stenosis. The anterior and middle cerebral vessels are patent without proximal stenosis, aneurysm or vascular malformation. No large or medium vessel occlusion. Fenestrated left M1 segment. Both vertebral arteries are  patent to the basilar. No basilar stenosis. Posterior circulation branch vessels appear normal. MRA NECK FINDINGS Limited resolution due to noncontrast technique. Both common carotid arteries show antegrade flow to the bifurcation regions. On the right, there is no apparent stenosis. On the left, there is too much artifact to evaluate the bifurcation region. Antegrade flow is present in both vertebral arteries. IMPRESSION: No acute intracranial finding. 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 Moderate chronic small-vessel ischemic changes of the cerebral hemispheric deep white matter. Generalized atrophy, temporal lobe predominant. 1. MRA head: No large or medium vessel occlusion or correctable proximal stenosis. 2. MRA neck: Allowing for noncontrast technique, there is no visible carotid bifurcation stenosis on the right. Left carotid bifurcation cannot be seen because of artifact. Antegrade flow is present in both vertebral arteries. Electronically Signed   By: Paulina Fusi M.D.   On: 05/06/2020 13:56   VAS US CAROTID  Result Date: 05/06/2020 Carotid Arterial Duplex Study Indications:       CVA. Risk Factors:      Hypertension. Limitations        Today's exam was limited due to the patient's respiratory                    variation. Comparison Study:  No prior studies. Performing Technologist: Chanda Busing RVT  Examination Guidelines: A complete evaluation includes B-mode imaging, spectral Doppler, color Doppler, and power Doppler as needed of all accessible portions of each vessel. Bilateral testing is considered an integral part of a complete examination. Limited examinations for reoccurring indications may be performed as noted.  Right Carotid Findings: +----------+--------+--------+--------+-----------------------+--------+           PSV cm/sEDV cm/sStenosisPlaque Description     Comments +----------+--------+--------+--------+-----------------------+--------+ CCA Prox  111      14              smooth and heterogenous         +----------+--------+--------+--------+-----------------------+--------+ CCA Distal78      14              smooth and heterogenous         +----------+--------+--------+--------+-----------------------+--------+ ICA Prox  97      16              smooth and heterogenous         +----------+--------+--------+--------+-----------------------+--------+ ICA Distal51      14                                     tortuous +----------+--------+--------+--------+-----------------------+--------+ ECA       224     26                                              +----------+--------+--------+--------+-----------------------+--------+ +----------+--------+-------+--------+-------------------+           PSV cm/sEDV cmsDescribeArm Pressure (mmHG) +----------+--------+-------+--------+-------------------+ [id number]                                        +----------+--------+-------+--------+-------------------+ +---------+--------+--+--------+--+---------+  VertebralPSV cm/s59EDV cm/s12Antegrade +---------+--------+--+--------+--+---------+  Left Carotid Findings: +----------+--------+--------+--------+-----------------------+--------+           PSV cm/sEDV cm/sStenosisPlaque Description     Comments +----------+--------+--------+--------+-----------------------+--------+ CCA Prox  107     14              smooth and heterogenous         +----------+--------+--------+--------+-----------------------+--------+ CCA Distal111     14              smooth and heterogenous         +----------+--------+--------+--------+-----------------------+--------+ ICA Prox  133     21              smooth and heterogenous         +----------+--------+--------+--------+-----------------------+--------+ ICA Distal88      24                                     tortuous  +----------+--------+--------+--------+-----------------------+--------+ ECA       107     9                                               +----------+--------+--------+--------+-----------------------+--------+ +----------+--------+--------+--------+-------------------+           PSV cm/sEDV cm/sDescribeArm Pressure (mmHG) +----------+--------+--------+--------+-------------------+ Subclavian200                                         +----------+--------+--------+--------+-------------------+ +---------+--------+--+--------+--+---------+ VertebralPSV cm/s57EDV cm/s12Antegrade +---------+--------+--+--------+--+---------+   Summary: Right Carotid: Velocities in the right ICA are consistent with a 1-39% stenosis. Left Carotid: Velocities in the left ICA are consistent with a 1-39% stenosis. Vertebrals: Bilateral vertebral arteries demonstrate antegrade flow. *See table(s) above for measurements and observations.  Electronically signed by Fabienne Bruns MD on 05/06/2020 at 11:49:43 AM.    Final    ECHOCARDIOGRAM LIMITED  Result Date: 05/04/2020    ECHOCARDIOGRAM LIMITED REPORT   Patient Name:   KAYVION ARNESON Date of Exam: 05/04/2020 Medical Rec #:  [id number]      Height:       68.0 in Accession #:    [id number]     Weight:       170.9 lb Date of Birth:  20-Sep-1940       BSA:          1.911 m Patient Age:    79 years       BP:           180/84 mmHg Patient Gender: M              HR:           70 bpm. Exam Location:  Inpatient Procedure: Limited Echo, Cardiac Doppler and Color Doppler Indications:    Syncope  History:        Patient has no prior history of Echocardiogram examinations.                 COPD, Signs/Symptoms:Syncope; Risk Factors:Hypertension,                 Dyslipidemia and Former Smoker. Parkinsons Dementia, COVID+.  Sonographer:    Lavenia Atlas Referring Phys: Albertine Grates  Sonographer Comments: Technically challenging study due to limited  acoustic windows. Image  acquisition challenging due to COPD. IMPRESSIONS  1. Left ventricular ejection fraction, by estimation, is 55 to 60%. The left ventricle has normal function. There is mild left ventricular hypertrophy. Left ventricular diastolic parameters are consistent with Grade I diastolic dysfunction (impaired relaxation).  2. Right ventricular systolic function is normal. The right ventricular size is normal. Tricuspid regurgitation signal is inadequate for assessing PA pressure.  3. Left atrial size was mildly dilated.  4. The mitral valve is normal in structure. No evidence of mitral valve regurgitation. No evidence of mitral stenosis.  5. The aortic valve was not well visualized. Aortic valve regurgitation is trivial. No aortic stenosis is present.  6. Technically difficult study with poor acoustic windows. FINDINGS  Left Ventricle: Left ventricular ejection fraction, by estimation, is 55 to 60%. The left ventricle has normal function. The left ventricular internal cavity size was normal in size. There is mild left ventricular hypertrophy. Left ventricular diastolic  parameters are consistent with Grade I diastolic dysfunction (impaired relaxation). Right Ventricle: The right ventricular size is normal. No increase in right ventricular wall thickness. Right ventricular systolic function is normal. Tricuspid regurgitation signal is inadequate for assessing PA pressure. Left Atrium: Left atrial size was mildly dilated. Right Atrium: Right atrial size was normal in size. Mitral Valve: The mitral valve is normal in structure. No evidence of mitral valve stenosis. Tricuspid Valve: The tricuspid valve is normal in structure. Tricuspid valve regurgitation is not demonstrated. Aortic Valve: The aortic valve was not well visualized. Aortic valve regurgitation is trivial. No aortic stenosis is present. Pulmonic Valve: The pulmonic valve was not well visualized. Aorta: The aortic root is normal in size and structure. Venous: The  inferior vena cava was not well visualized. IAS/Shunts: No atrial level shunt detected by color flow Doppler. LEFT VENTRICLE PLAX 2D LVIDd:         3.70 cm Diastology LVIDs:         2.60 cm LV e' medial:    4.79 cm/s LV PW:         1.40 cm LV E/e' medial:  9.3 LV IVS:        1.20 cm LV e' lateral:   5.77 cm/s                        LV E/e' lateral: 7.7  RIGHT VENTRICLE RV S prime:     7.07 cm/s MITRAL VALVE MV Area (PHT): 2.22 cm MV Decel Time: 342 msec MV E velocity: 44.60 cm/s MV A velocity: 62.60 cm/s MV E/A ratio:  0.71 Marca Ancona MD Electronically signed by Marca Ancona MD Signature Date/Time: 05/04/2020/3:45:20 PM    Final

## 2020-05-08 NOTE — Progress Notes (Signed)
Neurology Progress Note  S: Syncope, mental status change worsened after Haldol 11/12. Neuro consulted on admission for age indeterminate stroke (appears chronic on imaging).  O: Current vital signs: BP (!) 194/93 (BP Location: Right Arm)   Pulse 71   Temp 98.2 F (36.8 C) (Oral)   Resp 12   Ht 5\' 8"  (1.727 m)   Wt 77.5 kg   SpO2 97%   BMI 25.98 kg/m  Vital signs in last 24 hours: Temp:  [98.2 F (36.8 C)-98.8 F (37.1 C)] 98.2 F (36.8 C) (11/15 0815) Pulse Rate:  [63-77] 71 (11/15 0815) Resp:  [12-18] 12 (11/15 0815) BP: (116-194)/(64-99) 194/93 (11/15 0815) SpO2:  [97 %-100 %] 97 % (11/15 0815)  GENERAL: Awake, alert in NAD.  HEENT: normocephalic, atraumatic. Opens eyes on command but will not keep open for complete exam.  CV: RRR ABDOMEN: Soft, nontender, nondistended  Ext: warm, well perfused, intact peripheral pulses  NEURO:   Mental Status: AA&O to name, and city. States he is in a doctor's office. Does not know day, date or month.  Language: speech is intact, fluent.  Cranial Nerves: Will not allow pupil exam or comply with EOMI or visual field exam. no facial asymmetry, facial sensation intact, hearing intact, tongue/uvula/soft palate midline, normal sternocleidomastoid and trapezius muscle strength. No evidence of tongue atrophy or fasciculations Motor: Strength is 5/5 throughout.  Tone: is normal and bulk is normal Sensation- Intact to light touch bilaterally Coordination: Cannot follow instructions for FTN or heel to shin.  Gait- Deferred  Medications  Current Facility-Administered Medications:  .  aspirin tablet 325 mg, 325 mg, Oral, Daily, 01-31-1974, MD, 325 mg at 05/08/20 0859 .  B-complex with vitamin C tablet 1 tablet, 1 tablet, Oral, Daily, 05/10/20, MD, 1 tablet at 05/08/20 0900 .  carbidopa-levodopa (SINEMET IR) 25-100 MG per tablet immediate release 1 tablet, 1 tablet, Oral, TID WC, 05/10/20, MD, 1 tablet at 05/08/20 0900 .   carvedilol (COREG) tablet 6.25 mg, 6.25 mg, Oral, BID WC, 05/10/20, MD, 6.25 mg at 05/08/20 0859 .  cholecalciferol (VITAMIN D3) tablet 1,000 Units, 1,000 Units, Oral, Daily, 05/10/20, MD, 1,000 Units at 05/08/20 0900 .  enoxaparin (LOVENOX) injection 40 mg, 40 mg, Subcutaneous, Q24H, 05/10/20 B, MD, 40 mg at 05/08/20 0900 .  haloperidol lactate (HALDOL) injection 2 mg, 2 mg, Intravenous, Q6H PRN, 05/10/20, MD .  hydrALAZINE (APRESOLINE) injection 10 mg, 10 mg, Intravenous, Q6H PRN, Leroy Sea, MD, 10 mg at 05/08/20 0914 .  hydrALAZINE (APRESOLINE) tablet 50 mg, 50 mg, Oral, Q8H, 05/10/20, MD, 50 mg at 05/08/20 0914 .  lactated ringers infusion, , Intravenous, Continuous, 05/10/20, MD, Last Rate: 125 mL/hr at 05/08/20 0903, New Bag at 05/08/20 0903 .  magnesium sulfate IVPB 4 g 100 mL, 4 g, Intravenous, Once, 05/10/20, MD, Last Rate: 50 mL/hr at 05/08/20 0905, 4 g at 05/08/20 0905 .  rivastigmine (EXELON) capsule 1.5 mg, 1.5 mg, Oral, BID, 05/10/20 B, MD, 1.5 mg at 05/08/20 0915 .  rosuvastatin (CRESTOR) tablet 10 mg, 10 mg, Oral, Daily, 05/10/20, MD, 10 mg at 05/08/20 0859 .  traZODone (DESYREL) tablet 100 mg, 100 mg, Oral, QHS, 05/10/20, MD, 100 mg at 05/07/20 2232 .  valACYclovir (VALTREX) tablet 1,000 mg, 1,000 mg, Oral, BID, 2233 K, MD, 1,000 mg at 05/08/20 0900  Labs CBC     Component  Value Date/Time   WBC 2.7 (L) 05/08/2020 0127   RBC 3.80 (L) 05/08/2020 0127   HGB 11.2 (L) 05/08/2020 0127   HCT 31.9 (L) 05/08/2020 0127   PLT 105 (L) 05/08/2020 0127   MCV 83.9 05/08/2020 0127   MCH 29.5 05/08/2020 0127   MCHC 35.1 05/08/2020 0127   RDW 11.3 (L) 05/08/2020 0127   LYMPHSABS 1.3 05/08/2020 0127   MONOABS 0.3 05/08/2020 0127   EOSABS 0.1 05/08/2020 0127   BASOSABS 0.0 05/08/2020 0127    CMP     Component Value Date/Time   NA 137 05/08/2020 0127   K 3.8 05/08/2020 0127   CL 105 05/08/2020  0127   CO2 24 05/08/2020 0127   GLUCOSE 104 (H) 05/08/2020 0127   BUN 18 05/08/2020 0127   CREATININE 1.49 (H) 05/08/2020 0127   CREATININE 1.55 (H) 01/17/2020 1535   CALCIUM 8.6 (L) 05/08/2020 0127   PROT 5.4 (L) 05/08/2020 0127   ALBUMIN 2.8 (L) 05/08/2020 0127   AST 16 05/08/2020 0127   ALT <5 05/08/2020 0127   ALKPHOS 51 05/08/2020 0127   BILITOT 1.2 05/08/2020 0127   GFRNONAA 47 (L) 05/08/2020 0127   GFRNONAA 42 (L) 01/17/2020 1535   GFRAA 48 (L) 01/17/2020 1535    glycosylated hemoglobin  Lipid Panel     Component Value Date/Time   CHOL 139 05/05/2020 0401   TRIG 52 05/05/2020 0401   HDL 39 (L) 05/05/2020 0401   CHOLHDL 3.6 05/05/2020 0401   VLDL 10 05/05/2020 0401   LDLCALC 90 05/05/2020 0401   LDLDIRECT 114.0 12/30/2016 1031     Imaging I have reviewed images in epic and the results pertinent to this consultation are: 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 MRI/MRA, EEG unrevealing.  Labs: LDL 90 for which rosuvastatin is being trialed due to hx of leg cramps with atoravastatin.    Assessment: 79 yo male who presented to the ED 4 days ago after 2 unresponsive episodes. Neuro was consulted for finding of age indeterminate infarct on CT head. Pt was transferred to [facility]. After transfer pt's work up continued with an unrevealing MRI/MRA with no acute infarct. EEG unremarkable. Pt had a dose of Ativan and Haldol on the 12th for MRI sedation and this led to AMS. He has improved as meds have worn off.  1. 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  2. Syncope-likely autonomic component of PD. Work up neg for acute issues.  3. PD-stable. Continue home meds. Follow up with outpt neurologist.   4. Dementia with AMS-mental status seems back to baseline following haldol administration. Is awake alert and following most commands this morning.    Recommendations: Continue secondary stroke prevention with ASA and statin.  F/up with outpt neurologist for PD and dementia. Neuro will sign off. Call with questions.    Jimmye Norman, MSN, APN-BC/Nurse Practitioner Pager: [phone]  Electronically signed: Dr. Caryl Pina

## 2020-05-08 NOTE — TOC Progression Note (Signed)
Transition of Care [facility]) - Progression Note    Patient Details  Name: HAMLET LASECKI MRN: [id number] Date of Birth: 06-11-1941  [facility]SW Contact  Baldemar Lenis, [place] Phone Number: 05/08/2020, 2:42 PM  Clinical Narrative:   CSW checked with [facility] and they are not taking COVID patients at this time. CSW spoke with patient's wife, discussed other SNF options that are taking COVID patients. Wife initially discussed how she then wanted him to come home, but after discussing his current needs and how she is recovering from a knee replacement, she is agreeable to looking into [place]. CSW sent referral to [facility], and they are able to accept the patient. [facility] will contact the wife to answer her questions about the patient's quarantine due to COVID at SNF. CSW to fax insurance authorization request. [facility] will not have a bed available until tomorrow. CSW to follow.    Expected Discharge Plan: Skilled Nursing Facility Barriers to Discharge: English as a second language teacher, Continued Medical Work up  Expected Discharge Plan and Services Expected Discharge Plan: Skilled Nursing Facility       Living arrangements for the past 2 months: Single Family Home                                       Social Determinants of Health (SDOH) Interventions    Readmission Risk Interventions No flowsheet data found.

## 2020-05-09 LAB — D-DIMER, QUANTITATIVE: D-Dimer, Quant: 0.99 ug/mL-FEU — ABNORMAL HIGH (ref 0.00–0.50)

## 2020-05-09 LAB — CBC WITH DIFFERENTIAL/PLATELET
Abs Immature Granulocytes: 0.01 10*3/uL (ref 0.00–0.07)
Basophils Absolute: 0 10*3/uL (ref 0.0–0.1)
Basophils Relative: 0 %
Eosinophils Absolute: 0 10*3/uL (ref 0.0–0.5)
Eosinophils Relative: 1 %
HCT: 32.5 % — ABNORMAL LOW (ref 39.0–52.0)
Hemoglobin: 11.6 g/dL — ABNORMAL LOW (ref 13.0–17.0)
Immature Granulocytes: 0 %
Lymphocytes Relative: 35 %
Lymphs Abs: 1.2 10*3/uL (ref 0.7–4.0)
MCH: 29.5 pg (ref 26.0–34.0)
MCHC: 35.7 g/dL (ref 30.0–36.0)
MCV: 82.7 fL (ref 80.0–100.0)
Monocytes Absolute: 0.4 10*3/uL (ref 0.1–1.0)
Monocytes Relative: 10 %
Neutro Abs: 1.8 10*3/uL (ref 1.7–7.7)
Neutrophils Relative %: 54 %
Platelets: 134 10*3/uL — ABNORMAL LOW (ref 150–400)
RBC: 3.93 MIL/uL — ABNORMAL LOW (ref 4.22–5.81)
RDW: 11.2 % — ABNORMAL LOW (ref 11.5–15.5)
WBC: 3.5 10*3/uL — ABNORMAL LOW (ref 4.0–10.5)
nRBC: 0 % (ref 0.0–0.2)

## 2020-05-09 LAB — COMPREHENSIVE METABOLIC PANEL
ALT: 6 U/L (ref 0–44)
AST: 15 U/L (ref 15–41)
Albumin: 2.8 g/dL — ABNORMAL LOW (ref 3.5–5.0)
Alkaline Phosphatase: 54 U/L (ref 38–126)
Anion gap: 9 (ref 5–15)
BUN: 19 mg/dL (ref 8–23)
CO2: 24 mmol/L (ref 22–32)
Calcium: 8.6 mg/dL — ABNORMAL LOW (ref 8.9–10.3)
Chloride: 104 mmol/L (ref 98–111)
Creatinine, Ser: 1.47 mg/dL — ABNORMAL HIGH (ref 0.61–1.24)
GFR, Estimated: 48 mL/min — ABNORMAL LOW (ref >60)
Glucose, Bld: 103 mg/dL — ABNORMAL HIGH (ref 70–99)
Potassium: 3.4 mmol/L — ABNORMAL LOW (ref 3.5–5.1)
Sodium: 137 mmol/L (ref 135–145)
Total Bilirubin: 1.1 mg/dL (ref 0.3–1.2)
Total Protein: 5.3 g/dL — ABNORMAL LOW (ref 6.5–8.1)

## 2020-05-09 LAB — C-REACTIVE PROTEIN: CRP: 4.3 mg/dL — ABNORMAL HIGH (ref <1.0)

## 2020-05-09 LAB — MAGNESIUM: Magnesium: 2.2 mg/dL (ref 1.7–2.4)

## 2020-05-09 LAB — BRAIN NATRIURETIC PEPTIDE: B Natriuretic Peptide: 126.2 pg/mL — ABNORMAL HIGH (ref 0.0–100.0)

## 2020-05-09 MED ORDER — ROSUVASTATIN CALCIUM 10 MG PO TABS
10.0000 mg | ORAL_TABLET | Freq: Every day | ORAL | Status: DC
Start: 2020-05-09 — End: 2020-08-15

## 2020-05-09 MED ORDER — ASPIRIN 81 MG PO CHEW
81.0000 mg | CHEWABLE_TABLET | Freq: Every day | ORAL | Status: DC
Start: 2020-05-09 — End: 2020-09-20

## 2020-05-09 MED ORDER — CARVEDILOL 6.25 MG PO TABS
6.2500 mg | ORAL_TABLET | Freq: Two times a day (BID) | ORAL | Status: DC
Start: 2020-05-09 — End: 2020-06-12

## 2020-05-09 MED ORDER — HYDRALAZINE HCL 100 MG PO TABS
100.0000 mg | ORAL_TABLET | Freq: Three times a day (TID) | ORAL | Status: DC
Start: 2020-05-09 — End: 2020-05-31

## 2020-05-09 MED ORDER — HYDRALAZINE HCL 50 MG PO TABS
100.0000 mg | ORAL_TABLET | Freq: Three times a day (TID) | ORAL | Status: DC
  Administered 2020-05-09: 100 mg via ORAL
  Filled 2020-05-09: qty 2

## 2020-05-09 NOTE — Discharge Instructions (Signed)
Follow with Primary MD Plotnikov, Georgina Quint, MD in 7 days   Get CBC, CMP, Mag, checked next visit within 1 week by Primary MD or SNF MD    Activity: As tolerated with Full fall precautions use walker/cane & assistance as needed  Disposition Home    Diet: Heart Healthy with feeding assistance and aspiration precautions.   Special Instructions: If you have smoked or chewed Tobacco  in the last 2 yrs please stop smoking, stop any regular Alcohol  and or any Recreational drug use.  On your next visit with your primary care physician please Get Medicines reviewed and adjusted.  Please request your Prim.MD to go over all Hospital Tests and Procedure/Radiological results at the follow up, please get all Hospital records sent to your Prim MD by signing hospital release before you go home.  If you experience worsening of your admission symptoms, develop shortness of breath, life threatening emergency, suicidal or homicidal thoughts you must seek medical attention immediately by calling 911 or calling your MD immediately  if symptoms less severe.  You Must read complete instructions/literature along with all the possible adverse reactions/side effects for all the Medicines you take and that have been prescribed to you. Take any new Medicines after you have completely understood and accpet all the possible adverse reactions/side effects.

## 2020-05-09 NOTE — TOC Transition Note (Signed)
Transition of Care [facility]) - CM/SW Discharge Note   Patient Details  Name: [name] MRN: [id number] Date of Birth: 15-Jul-1940  [facility]) CM/SW Contact:  Baldemar Lenis, LCSW Phone Number: 05/09/2020, 10:26 AM   Clinical Narrative:   Nurse to call report to [phone], Room 807P.    Final next level of care: Skilled Nursing Facility Barriers to Discharge: Barriers Resolved   Patient Goals and CMS Choice Patient states their goals for this hospitalization and ongoing recovery are:: Pt was unable to verbalize goals CMS Medicare.gov Compare Post Acute Care list provided to:: Patient Choice offered to / list presented to : Spouse  Discharge Placement              Patient [facility] bed at: [facility] Patient to be transferred to facility by: [facility] Name of family member notified: [name] Patient and family notified of of transfer: 05/09/20  Discharge Plan and Services                                     Social Determinants of Health (SDOH) Interventions     Readmission Risk Interventions No flowsheet data found.

## 2020-05-09 NOTE — Discharge Summary (Signed)
[name]:[id number] DOB: [birth year] DOA: 05/03/2020  PCP: [name], MD  Admit date: 05/03/2020  Discharge date: 05/09/2020  Admitted From: Home  Disposition:  SNF   Recommendations for Outpatient Follow-up:   Follow up with PCP in 1-2 weeks  PCP Please obtain BMP/CBC, 2 view CXR in 1week,  (see Discharge instructions)   PCP Please follow up on the following pending results:    Home Health: None   Equipment/Devices: None  Consultations: Neuro Discharge Condition: Stable    CODE STATUS: Full    Diet Recommendation: Heart Healthy   Diet Order            Diet - low sodium heart healthy           Diet regular Room service appropriate? Yes; Fluid consistency: Thin  Diet effective now                  Chief Complaint  Patient presents with   Altered Mental Status     Brief history of present illness from the day of admission and additional interim summary    [name] an79 y.o.malewith a history of Parkinson's disease, dementia, increasing falls at home, covid-19 vaccinations, and BPH who presented to the ED from home after 2 episodes of loss of consciousness. He has had altered mental status since arrival, so history is limited. In the ED he was hypertensive to 169/91, afebrile without respiratory distress or hypoxia. Pancytopenia (ANC 1.1k, plt 105k, hgb 10.8g/dl) and hypokalemia were noted on labs. No bacteriuria on UA or infiltrate on CXR, though screening SARS-CoV-2 PCR was positive. Subsequent inflammatory markers have been negative. He was admitted for syncope evaluation with work up pending including CT head, echo, and EEG. An age-indeterminate infarct of the posterior left cerebellum was noted on CT for which MRI is ordered.                                                                   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    Full stroke pathway was followed, so was seen and cleared by neurology.  Altered mental status on chronic dementia, history of Parkinson's: Likely metabolic encephalopathy, No CVA, EEG stable, ? if Parkinson's-related. Currently mentation has improved now close to baseline.  Avoid benzodiazepines and narcotics.  Syncope:Due to autonomic instability causing orthostatic hypotension in a patient with Parkinson's.  Hold Cardura, has been hydrated, continue TED stockings, continue to monitor fall precautions.  PT OT can to  continue at SNF.     Covid-19 infection: Incidental on admission screening PCR in vaccinated patient [employer] 3/25, 4/19) with no infiltrate on CXR, no inflammatory marker elevation, and no infectious or pulmonary symptoms.Received bamlanivimab/etesevimab 11/11. Stable.  Folic acid deficiency:  was supplimented.  Pancytopenia: Likely related to covid and anemia of chronic disease. Monitor.   Stage IIIa CKD: UA with no proteinuria, but with hyaline casts,  uses PRN torsemide,  Avoid nephrotoxins.  Baseline creatinine appears to be between 1.5-1.8.  Parkinson's disease: Followed by Dr. Karel Jarvis. - Continue sinemet, rivastigmine.  Hypmagnesemia: -Replaced.  BPH: Patient if he had orthostatic hypotension when he passed out, orthostatics are still pending, for now continue to hold Cardura.  Hypertension.  On Coreg along with hydralazine, dose adjusted for better control.   Discharge diagnosis     Active Problems:   Hypokalemia   Falls frequently   Dementia due to Parkinson's disease without behavioral disturbance (HCC)    Syncope   Neutropenia (HCC)   Thrombocytopenia (HCC)   COVID-19 virus infection   AMS (altered mental status)    Discharge instructions    Discharge Instructions    Diet - low sodium heart healthy   Complete by: As directed    Discharge instructions   Complete by: As directed    Follow with Primary MD Plotnikov, Georgina Quint, MD in 7 days   Get CBC, CMP, Mag, checked next visit within 1 week by Primary MD or SNF MD    Activity: As tolerated with Full fall precautions use walker/cane & assistance as needed  Disposition Home    Diet: Heart Healthy with feeding assistance and aspiration precautions.   Special Instructions: If you have smoked or chewed Tobacco  in the last 2 yrs please stop smoking, stop any regular Alcohol  and or any Recreational drug use.  On your next visit with your primary care physician please Get Medicines reviewed and adjusted.  Please request your Prim.MD to go over all Hospital Tests and Procedure/Radiological results at the follow up, please get all Hospital records sent to your Prim MD by signing hospital release before you go home.  If you experience worsening of your admission symptoms, develop shortness of breath, life threatening emergency, suicidal or homicidal thoughts you must seek medical attention immediately by calling 911 or calling your MD immediately  if symptoms less severe.  You Must read complete instructions/literature along with all the possible adverse reactions/side effects for all the Medicines you take and that have been prescribed to you. Take any new Medicines after you have completely understood and accpet all the possible adverse reactions/side effects.   Increase activity slowly   Complete by: As directed       Discharge Medications   Allergies as of 05/09/2020      Reactions   Amlodipine Other (See Comments)   Edema w/10 mg   Lorazepam Other (See Comments)   DO NOT GIVE BENZO TO PARKINSON PT WITH CONCERNS FOR LEWY BODY  DEMENTIA, CAUSES PARADOXICAL WORSENING OF THEIR AGITATION!   Penicillins Hives   Has patient had a PCN reaction causing immediate rash, facial/tongue/throat swelling, SOB or lightheadedness with hypotension: Yes Has patient had a PCN reaction causing severe rash involving mucus membranes or skin necrosis: No Has patient had a PCN reaction that required hospitalization No Has patient had a PCN reaction occurring within the last 10 years: No If all of the above answers are "NO", then may proceed with Cephalosporin use.   Sulfonamide Derivatives  Hives   Atorvastatin    REACTION: leg cramps   Sulfa Antibiotics Other (See Comments), Palpitations   Sweating, passed out   Telmisartan Other (See Comments)   ?fatigue      Medication List    STOP taking these medications   doxazosin 4 MG tablet Commonly known as: CARDURA     TAKE these medications   aspirin 81 MG chewable tablet Chew 1 tablet (81 mg total) by mouth daily.   carbidopa-levodopa 25-100 MG tablet Commonly known as: SINEMET IR Take 1 tablet three times a day with meals What changed:   how much to take  how to take this  when to take this  additional instructions   carvedilol 6.25 MG tablet Commonly known as: COREG Take 1 tablet (6.25 mg total) by mouth 2 (two) times daily with a meal.   vitamin B-12 1000 MCG tablet Commonly known as: CYANOCOBALAMIN Take 1,000 mcg by mouth daily. What changed: Another medication with the same name was changed. Make sure you understand how and when to take each.   cyanocobalamin 1000 MCG/ML injection Commonly known as: (VITAMIN B-12) 1 ml sq for 5 days, then 1 ml weekly for 4 weeks, then 1 ml every 2 weeks What changed:   how much to take  how to take this  when to take this  additional instructions   hydrALAZINE 100 MG tablet Commonly known as: APRESOLINE Take 1 tablet (100 mg total) by mouth every 8 (eight) hours.   loratadine-pseudoephedrine 10-240 MG 24 hr  tablet Commonly known as: CLARITIN-D 24-hour Take 1 tablet by mouth daily as needed for allergies.   rivastigmine 1.5 MG capsule Commonly known as: EXELON Take 1 capsule (1.5 mg total) by mouth 2 (two) times daily.   rosuvastatin 10 MG tablet Commonly known as: CRESTOR Take 1 tablet (10 mg total) by mouth daily.   SYRINGE-NEEDLE (DISP) 3 ML 25G X 1" 3 ML Misc Commonly known as: BD Eclipse Syringe Use sq as directed   torsemide 100 MG tablet Commonly known as: DEMADEX Take 0.5-1 tablets (50-100 mg total) by mouth daily as needed. What changed: reasons to take this   traZODone 50 MG tablet Commonly known as: DESYREL TAKE 2 TABLETS BY MOUTH AT BEDTIME What changed:   how much to take  how to take this  when to take this  additional instructions   valACYclovir 1000 MG tablet Commonly known as: VALTREX Take 1 tablet (1,000 mg total) by mouth 3 (three) times daily.   Vitamin D 1000 units capsule Take 1 capsule (1,000 Units total) by mouth daily.        Follow-up Information    Plotnikov, Georgina Quint, MD. Schedule an appointment as soon as possible for a visit in 1 week(s).   Specialty: Internal Medicine Contact information: [address] [place] [address] [phone]               Major procedures and Radiology Reports - PLEASE review detailed and final reports thoroughly  -        EEG  Result Date: 05/05/2020 Charlsie Quest, MD     05/05/2020  9:30 AM Patient Name: AMRON GUERRETTE MRN: [id number] Epilepsy Attending: Charlsie Quest Referring Physician/Provider: Dr Albertine Grates Date: 05/04/2020 Duration: 23.26 minutes Patient history: 79 year old male with episodes of syncope x2.  EEG to evaluate for seizures. Level of alertness: Awake, drowsy, sleep, comatose, lethargic AEDs during EEG study: None Technical aspects: This EEG study was done with  scalp electrodes positioned according to the 10-20 International system of electrode placement. Electrical  activity was acquired at a sampling rate of  and reviewed with a high frequency filter of  and a low frequency filter of . EEG data were recorded continuously and digitally stored. Description: The posterior dominant rhythm consists of 8 Hz activity of moderate voltage (25-35 uV) seen predominantly in posterior head regions, symmetric and reactive to eye opening and eye closing. EEG showed continuous generalized 5 to 6 Hz theta as well as intermittent 2 to 3 Hz delta slowing. Hyperventilation and photic stimulation were not performed.   ABNORMALITY -Continuous slow, generalized IMPRESSION: This study is suggestive of mild diffuse encephalopathy, nonspecific etiology. No seizures or epileptiform discharges were seen throughout the recording. Charlsie Quest   DG Chest 2 View  Result Date: 05/03/2020 CLINICAL DATA:  79 year old male with a history of syncope EXAM: CHEST - 2 VIEW COMPARISON:  07/03/2019 FINDINGS: Cardiomediastinal silhouette likely unchanged in size and contour given the positioning. No pneumothorax. No pleural effusion. Coarsened interstitial markings bilateral lungs without confluent airspace disease. No acute displaced fracture. Degenerative changes of the visualized spine. IMPRESSION: Negative for acute cardiopulmonary disease Electronically Signed   By: Gilmer Mor D.O.   On: 05/03/2020 13:50   CT Head Wo Contrast  Result Date: 05/03/2020 CLINICAL DATA:  Mental status change, unknown cause. EXAM: CT HEAD WITHOUT CONTRAST TECHNIQUE: Contiguous axial images were obtained from the base of the skull through the vertex without intravenous contrast. COMPARISON:  Head CT 02/16/2019.  Brain MRI 11/23/2018. FINDINGS: Brain: Moderate cerebral atrophy. Moderate ill-defined hypoattenuation within the cerebral white matter is nonspecific, but compatible with chronic small vessel ischemic disease. A small infarct is questioned within the posterior left cerebellar hemisphere, new from  the prior head CT of 02/16/2019 but otherwise age indeterminate (series 2, image 9) (series 5, image 56). There is no acute intracranial hemorrhage. No demarcated cortical infarct. No extra-axial fluid collection. No evidence of intracranial mass. No midline shift. Vascular: No hyperdense vessel.  Atherosclerotic calcifications. Skull: Normal. Negative for fracture or focal lesion. Sinuses/Orbits: Visualized orbits show no acute finding. Mild ethmoid and maxillary sinus mucosal thickening. IMPRESSION: A small infarct is questioned within the posterior left cerebellar hemisphere, new from the prior head CT of 02/16/2019 but otherwise age indeterminate. Moderate cerebral atrophy and chronic small vessel ischemic disease. Mild ethmoid and maxillary sinus mucosal thickening. Electronically Signed   By: Jackey Loge DO   On: 05/03/2020 19:26   MR ANGIO HEAD WO CONTRAST  Result Date: 05/06/2020 CLINICAL DATA:  Posterior cerebellar infarction, age indeterminate. EXAM: MRI HEAD WITHOUT CONTRAST MRA HEAD WITHOUT CONTRAST MRA NECK WITHOUT CONTRAST TECHNIQUE: Multiplanar, multiecho pulse sequences of the brain and surrounding structures were obtained without intravenous contrast. Angiographic images of the Circle of Willis were obtained using MRA technique without intravenous contrast. Angiographic images of the neck were obtained using MRA technique without intravenous contrast. Carotid stenosis measurements (when applicable) are obtained utilizing NASCET criteria, using the distal internal carotid diameter as the denominator. COMPARISON:  Head CT 05/03/2020 FINDINGS: MRI HEAD FINDINGS Brain: Diffusion imaging does not show any acute or subacute infarction. There are old small vessel infarctions within the left posterior cerebellum. Mild chronic small-vessel change affects the pons. Cerebral hemispheres show generalized atrophy, temporal lobe predominant. There are moderate chronic small-vessel ischemic changes of the  deep white matter. No large vessel territory cerebral hemispheric infarction. No mass lesion, hemorrhage, hydrocephalus or extra-axial collection. Vascular: Major vessels at  the base of the brain show flow. Skull and upper cervical spine: Negative Sinuses/Orbits: Mild mucosal inflammatory changes of the sinuses. Orbits negative. Other: None MRA HEAD FINDINGS Both internal carotid arteries widely patent through the skull base and siphon regions. No siphon stenosis. The anterior and middle cerebral vessels are patent without proximal stenosis, aneurysm or vascular malformation. No large or medium vessel occlusion. Fenestrated left M1 segment. Both vertebral arteries are patent to the basilar. No basilar stenosis. Posterior circulation branch vessels appear normal. MRA NECK FINDINGS Limited resolution due to noncontrast technique. Both common carotid arteries show antegrade flow to the bifurcation regions. On the right, there is no apparent stenosis. On the left, there is too much artifact to evaluate the bifurcation region. Antegrade flow is present in both vertebral arteries. IMPRESSION: No acute intracranial finding. 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 Moderate chronic small-vessel ischemic changes of the cerebral hemispheric deep white matter. Generalized atrophy, temporal lobe predominant. 1. MRA head: No large or medium vessel occlusion or correctable proximal stenosis. 2. MRA neck: Allowing for noncontrast technique, there is no visible carotid bifurcation stenosis on the right. Left carotid bifurcation cannot be seen because of artifact. Antegrade flow is present in both vertebral arteries. Electronically Signed   By: Paulina Fusi M.D.   On: 05/06/2020 13:56   MR ANGIO NECK WO CONTRAST  Result Date: 05/06/2020 CLINICAL DATA:  Posterior cerebellar infarction, age indeterminate. EXAM: MRI HEAD WITHOUT CONTRAST MRA HEAD WITHOUT CONTRAST MRA NECK WITHOUT CONTRAST TECHNIQUE: Multiplanar,  multiecho pulse sequences of the brain and surrounding structures were obtained without intravenous contrast. Angiographic images of the Circle of Willis were obtained using MRA technique without intravenous contrast. Angiographic images of the neck were obtained using MRA technique without intravenous contrast. Carotid stenosis measurements (when applicable) are obtained utilizing NASCET criteria, using the distal internal carotid diameter as the denominator. COMPARISON:  Head CT 05/03/2020 FINDINGS: MRI HEAD FINDINGS Brain: Diffusion imaging does not show any acute or subacute infarction. There are old small vessel infarctions within the left posterior cerebellum. Mild chronic small-vessel change affects the pons. Cerebral hemispheres show generalized atrophy, temporal lobe predominant. There are moderate chronic small-vessel ischemic changes of the deep white matter. No large vessel territory cerebral hemispheric infarction. No mass lesion, hemorrhage, hydrocephalus or extra-axial collection. Vascular: Major vessels at the base of the brain show flow. Skull and upper cervical spine: Negative Sinuses/Orbits: Mild mucosal inflammatory changes of the sinuses. Orbits negative. Other: None MRA HEAD FINDINGS Both internal carotid arteries widely patent through the skull base and siphon regions. No siphon stenosis. The anterior and middle cerebral vessels are patent without proximal stenosis, aneurysm or vascular malformation. No large or medium vessel occlusion. Fenestrated left M1 segment. Both vertebral arteries are patent to the basilar. No basilar stenosis. Posterior circulation branch vessels appear normal. MRA NECK FINDINGS Limited resolution due to noncontrast technique. Both common carotid arteries show antegrade flow to the bifurcation regions. On the right, there is no apparent stenosis. On the left, there is too much artifact to evaluate the bifurcation region. Antegrade flow is present in both vertebral  arteries. IMPRESSION: No acute intracranial finding. 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 Moderate chronic small-vessel ischemic changes of the cerebral hemispheric deep white matter. Generalized atrophy, temporal lobe predominant. 1. MRA head: No large or medium vessel occlusion or correctable proximal stenosis. 2. MRA neck: Allowing for noncontrast technique, there is no visible carotid bifurcation stenosis on the right. Left carotid bifurcation cannot be  seen because of artifact. Antegrade flow is present in both vertebral arteries. Electronically Signed   By: Paulina Fusi M.D.   On: 05/06/2020 13:56   MR BRAIN WO CONTRAST  Result Date: 05/06/2020 CLINICAL DATA:  Posterior cerebellar infarction, age indeterminate. EXAM: MRI HEAD WITHOUT CONTRAST MRA HEAD WITHOUT CONTRAST MRA NECK WITHOUT CONTRAST TECHNIQUE: Multiplanar, multiecho pulse sequences of the brain and surrounding structures were obtained without intravenous contrast. Angiographic images of the Circle of Willis were obtained using MRA technique without intravenous contrast. Angiographic images of the neck were obtained using MRA technique without intravenous contrast. Carotid stenosis measurements (when applicable) are obtained utilizing NASCET criteria, using the distal internal carotid diameter as the denominator. COMPARISON:  Head CT 05/03/2020 FINDINGS: MRI HEAD FINDINGS Brain: Diffusion imaging does not show any acute or subacute infarction. There are old small vessel infarctions within the left posterior cerebellum. Mild chronic small-vessel change affects the pons. Cerebral hemispheres show generalized atrophy, temporal lobe predominant. There are moderate chronic small-vessel ischemic changes of the deep white matter. No large vessel territory cerebral hemispheric infarction. No mass lesion, hemorrhage, hydrocephalus or extra-axial collection. Vascular: Major vessels at the base of the brain show flow. Skull and upper  cervical spine: Negative Sinuses/Orbits: Mild mucosal inflammatory changes of the sinuses. Orbits negative. Other: None MRA HEAD FINDINGS Both internal carotid arteries widely patent through the skull base and siphon regions. No siphon stenosis. The anterior and middle cerebral vessels are patent without proximal stenosis, aneurysm or vascular malformation. No large or medium vessel occlusion. Fenestrated left M1 segment. Both vertebral arteries are patent to the basilar. No basilar stenosis. Posterior circulation branch vessels appear normal. MRA NECK FINDINGS Limited resolution due to noncontrast technique. Both common carotid arteries show antegrade flow to the bifurcation regions. On the right, there is no apparent stenosis. On the left, there is too much artifact to evaluate the bifurcation region. Antegrade flow is present in both vertebral arteries. IMPRESSION: No acute intracranial finding. 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 Moderate chronic small-vessel ischemic changes of the cerebral hemispheric deep white matter. Generalized atrophy, temporal lobe predominant. 1. MRA head: No large or medium vessel occlusion or correctable proximal stenosis. 2. MRA neck: Allowing for noncontrast technique, there is no visible carotid bifurcation stenosis on the right. Left carotid bifurcation cannot be seen because of artifact. Antegrade flow is present in both vertebral arteries. Electronically Signed   By: Paulina Fusi M.D.   On: 05/06/2020 13:56   VAS US CAROTID  Result Date: 05/06/2020 Carotid Arterial Duplex Study Indications:       CVA. Risk Factors:      Hypertension. Limitations        Today's exam was limited due to the patient's respiratory                    variation. Comparison Study:  No prior studies. Performing Technologist: Chanda Busing RVT  Examination Guidelines: A complete evaluation includes B-mode imaging, spectral Doppler, color Doppler, and power Doppler as needed of  all accessible portions of each vessel. Bilateral testing is considered an integral part of a complete examination. Limited examinations for reoccurring indications may be performed as noted.  Right Carotid Findings: +----------+--------+--------+--------+-----------------------+--------+             PSV cm/s EDV cm/s Stenosis Plaque Description      Comments  +----------+--------+--------+--------+-----------------------+--------+  CCA Prox   111      14  smooth and heterogenous           +----------+--------+--------+--------+-----------------------+--------+  CCA Distal 78       14                smooth and heterogenous           +----------+--------+--------+--------+-----------------------+--------+  ICA Prox   97       16                smooth and heterogenous           +----------+--------+--------+--------+-----------------------+--------+  ICA Distal 51       14                                        tortuous  +----------+--------+--------+--------+-----------------------+--------+  ECA        224      26                                                  +----------+--------+--------+--------+-----------------------+--------+ +----------+--------+-------+--------+-------------------+             PSV cm/s EDV cms Describe Arm Pressure (mmHG)  +----------+--------+-------+--------+-------------------+  Subclavian 133                                            +----------+--------+-------+--------+-------------------+ +---------+--------+--+--------+--+---------+  Vertebral PSV cm/s 59 EDV cm/s 12 Antegrade  +---------+--------+--+--------+--+---------+  Left Carotid Findings: +----------+--------+--------+--------+-----------------------+--------+             PSV cm/s EDV cm/s Stenosis Plaque Description      Comments  +----------+--------+--------+--------+-----------------------+--------+  CCA Prox   107      14                smooth and heterogenous            +----------+--------+--------+--------+-----------------------+--------+  CCA Distal 111      14                smooth and heterogenous           +----------+--------+--------+--------+-----------------------+--------+  ICA Prox   133      21                smooth and heterogenous           +----------+--------+--------+--------+-----------------------+--------+  ICA Distal 88       24                                        tortuous  +----------+--------+--------+--------+-----------------------+--------+  ECA        107      9                                                   +----------+--------+--------+--------+-----------------------+--------+ +----------+--------+--------+--------+-------------------+             PSV cm/s EDV cm/s Describe Arm Pressure (mmHG)  +----------+--------+--------+--------+-------------------+  Subclavian 200                                             +----------+--------+--------+--------+-------------------+ +---------+--------+--+--------+--+---------+  Vertebral PSV cm/s 57 EDV cm/s 12 Antegrade  +---------+--------+--+--------+--+---------+   Summary: Right Carotid: Velocities in the right ICA are consistent with a 1-39% stenosis. Left Carotid: Velocities in the left ICA are consistent with a 1-39% stenosis. Vertebrals: Bilateral vertebral arteries demonstrate antegrade flow. *See table(s) above for measurements and observations.  Electronically signed by Fabienne Bruns MD on 05/06/2020 at 11:49:43 AM.    Final    ECHOCARDIOGRAM LIMITED  Result Date: 05/04/2020    ECHOCARDIOGRAM LIMITED REPORT   Patient Name:   NEGAN GRUDZIEN Date of Exam: 05/04/2020 Medical Rec #:  [id number]      Height:       68.0 in Accession #:    [id number]     Weight:       170.9 lb Date of Birth:  [birth year]       BSA:          1.911 m Patient Age:    79 years       BP:           180/84 mmHg Patient Gender: M              HR:           70 bpm. Exam Location:  Inpatient Procedure: Limited Echo, Cardiac  Doppler and Color Doppler Indications:    Syncope  History:        Patient has no prior history of Echocardiogram examinations.                 COPD, Signs/Symptoms:Syncope; Risk Factors:Hypertension,                 Dyslipidemia and Former Smoker. Parkinsons Dementia, COVID+.  Sonographer:    Lavenia Atlas Referring Phys: Albertine Grates  Sonographer Comments: Technically challenging study due to limited acoustic windows. Image acquisition challenging due to COPD. IMPRESSIONS  1. Left ventricular ejection fraction, by estimation, is 55 to 60%. The left ventricle has normal function. There is mild left ventricular hypertrophy. Left ventricular diastolic parameters are consistent with Grade I diastolic dysfunction (impaired relaxation).  2. Right ventricular systolic function is normal. The right ventricular size is normal. Tricuspid regurgitation signal is inadequate for assessing PA pressure.  3. Left atrial size was mildly dilated.  4. The mitral valve is normal in structure. No evidence of mitral valve regurgitation. No evidence of mitral stenosis.  5. The aortic valve was not well visualized. Aortic valve regurgitation is trivial. No aortic stenosis is present.  6. Technically difficult study with poor acoustic windows. FINDINGS  Left Ventricle: Left ventricular ejection fraction, by estimation, is 55 to 60%. The left ventricle has normal function. The left ventricular internal cavity size was normal in size. There is mild left ventricular hypertrophy. Left ventricular diastolic  parameters are consistent with Grade I diastolic dysfunction (impaired relaxation). Right Ventricle: The right ventricular size is normal. No increase in right ventricular wall thickness. Right ventricular systolic function is normal. Tricuspid regurgitation signal is inadequate for assessing PA pressure. Left Atrium: Left atrial size was mildly dilated. Right Atrium: Right atrial size was normal in size. Mitral Valve: The mitral valve is  normal in structure. No evidence of mitral valve stenosis. Tricuspid Valve: The tricuspid valve is normal in structure. Tricuspid valve regurgitation is not demonstrated. Aortic Valve: The aortic valve was not well visualized. Aortic valve regurgitation is trivial. No aortic stenosis is present. Pulmonic Valve: The pulmonic valve was not well visualized. Aorta: The aortic root is normal in size  and structure. Venous: The inferior vena cava was not well visualized. IAS/Shunts: No atrial level shunt detected by color flow Doppler. LEFT VENTRICLE PLAX 2D LVIDd:         3.70 cm Diastology LVIDs:         2.60 cm LV e' medial:    4.79 cm/s LV PW:         1.40 cm LV E/e' medial:  9.3 LV IVS:        1.20 cm LV e' lateral:   5.77 cm/s                        LV E/e' lateral: 7.7  RIGHT VENTRICLE RV S prime:     7.07 cm/s MITRAL VALVE MV Area (PHT): 2.22 cm MV Decel Time: 342 msec MV E velocity: 44.60 cm/s MV A velocity: 62.60 cm/s MV E/A ratio:  0.71 Marca Ancona MD Electronically signed by Marca Ancona MD Signature Date/Time: 05/04/2020/3:45:20 PM    Final     Micro Results     Recent Results (from the past 240 hour(s))  Respiratory Panel by RT PCR (Flu A&B, Covid) - Nasopharyngeal Swab     Status: Abnormal   Collection Time: 05/03/20  1:13 PM   Specimen: Nasopharyngeal Swab  Result Value Ref Range Status   SARS Coronavirus 2 by RT PCR POSITIVE (A) NEGATIVE Final    Comment: RESULT CALLED TO, READ BACK BY AND VERIFIED WITH: FRANKLIN,C. RN @1510  05/03/20 BILLINGSLEY,L    Influenza A by PCR NEGATIVE NEGATIVE Final   Influenza B by PCR NEGATIVE NEGATIVE Final    Comment: Performed at [facility], [address], [place], [place] [address]  Culture, blood (single)     Status: None   Collection Time: 05/03/20  4:32 PM   Specimen: BLOOD RIGHT FOREARM  Result Value Ref Range Status   Specimen Description   Final    BLOOD RIGHT FOREARM Performed at [facility], [address] [place], [place] [address]    Special Requests   Final    BOTTLES DRAWN AEROBIC AND ANAEROBIC Blood Culture adequate volume Performed at [facility], [address] [place], [place] [address]    Culture   Final    NO GROWTH 5 DAYS Performed at [facility], [address], [place], [place] [address]    Report Status 05/08/2020 FINAL  Final    Today   Subjective    Khamani Fairley today has no headache,no chest abdominal pain,no new weakness tingling or numbness, feels much better     Objective   Blood pressure (!) 177/76, pulse 66, temperature 98 F (36.7 C), temperature source Oral, resp. rate 14, height 5\' 8"  (1.727 m), weight 77.5 kg, SpO2 99 %.   Intake/Output Summary (Last 24 hours) at 05/09/2020 0856 Last data filed at 05/09/2020 0310 Gross per 24 hour  Intake 590 ml  Output 1100 ml  Net -510 ml    Exam  Awake Alert, No new F.N deficits,   [place].AT,eyes are crusted Supple Neck,No JVD, No cervical lymphadenopathy appriciated.  Symmetrical Chest wall movement, Good air movement bilaterally, CTAB RRR,No Gallops,Rubs or new Murmurs, No Parasternal Heave +ve B.Sounds, Abd Soft, Non tender, No organomegaly appriciated, No rebound -guarding or rigidity. No Cyanosis, Clubbing or edema, No new Rash or bruise   Data Review   CBC w Diff:  Lab Results  Component Value Date   WBC 3.5 (L)  05/09/2020   HGB 11.6 (L) 05/09/2020   HCT 32.5 (L) 05/09/2020   PLT 134 (L) 05/09/2020   LYMPHOPCT 35 05/09/2020   MONOPCT 10 05/09/2020   EOSPCT 1 05/09/2020   BASOPCT 0 05/09/2020    CMP:  Lab Results  Component Value Date   NA 137 05/09/2020   K 3.4 (L) 05/09/2020   CL 104 05/09/2020   CO2 24 05/09/2020   BUN 19 05/09/2020   CREATININE 1.47 (H) 05/09/2020   CREATININE 1.55 (H) 01/17/2020   PROT 5.3 (L) 05/09/2020   ALBUMIN 2.8 (L) 05/09/2020   BILITOT 1.1 05/09/2020   ALKPHOS 54 05/09/2020   AST 15 05/09/2020   ALT 6 05/09/2020    .   Total Time in preparing paper work, data evaluation and todays exam - 35 minutes  Susa RaringPrashant Chad Tiznado M.D on 05/09/2020 at 8:56 AM  [facility]   Office  [phone]

## 2020-05-09 NOTE — Plan of Care (Signed)
Max assist adls 

## 2020-05-10 ENCOUNTER — Emergency Department (HOSPITAL_COMMUNITY): Payer: Medicare Other

## 2020-05-10 ENCOUNTER — Inpatient Hospital Stay (HOSPITAL_COMMUNITY)
Admission: EM | Admit: 2020-05-10 | Discharge: 2020-05-31 | DRG: 056 | Disposition: A | Discharge status: Skilled Nursing Facility | Payer: Medicare Other | Attending: Internal Medicine | Admitting: Internal Medicine

## 2020-05-10 DIAGNOSIS — R1310 Dysphagia, unspecified: Secondary | ICD-10-CM | POA: Diagnosis not present

## 2020-05-10 DIAGNOSIS — E876 Hypokalemia: Secondary | ICD-10-CM | POA: Diagnosis not present

## 2020-05-10 DIAGNOSIS — N183 Chronic kidney disease, stage 3 unspecified: Secondary | ICD-10-CM | POA: Diagnosis not present

## 2020-05-10 DIAGNOSIS — R627 Adult failure to thrive: Secondary | ICD-10-CM | POA: Diagnosis present

## 2020-05-10 DIAGNOSIS — T428X6A Underdosing of antiparkinsonism drugs and other central muscle-tone depressants, initial encounter: Secondary | ICD-10-CM | POA: Diagnosis present

## 2020-05-10 DIAGNOSIS — G9340 Encephalopathy, unspecified: Secondary | ICD-10-CM | POA: Diagnosis present

## 2020-05-10 DIAGNOSIS — Z79899 Other long term (current) drug therapy: Secondary | ICD-10-CM

## 2020-05-10 DIAGNOSIS — Z833 Family history of diabetes mellitus: Secondary | ICD-10-CM

## 2020-05-10 DIAGNOSIS — Z87891 Personal history of nicotine dependence: Secondary | ICD-10-CM

## 2020-05-10 DIAGNOSIS — Z7982 Long term (current) use of aspirin: Secondary | ICD-10-CM

## 2020-05-10 DIAGNOSIS — U071 COVID-19: Secondary | ICD-10-CM | POA: Diagnosis present

## 2020-05-10 DIAGNOSIS — I872 Venous insufficiency (chronic) (peripheral): Secondary | ICD-10-CM | POA: Diagnosis present

## 2020-05-10 DIAGNOSIS — E872 Acidosis: Secondary | ICD-10-CM

## 2020-05-10 DIAGNOSIS — Z8673 Personal history of transient ischemic attack (TIA), and cerebral infarction without residual deficits: Secondary | ICD-10-CM

## 2020-05-10 DIAGNOSIS — G20 Parkinson's disease: Principal | ICD-10-CM | POA: Diagnosis present

## 2020-05-10 DIAGNOSIS — I129 Hypertensive chronic kidney disease with stage 1 through stage 4 chronic kidney disease, or unspecified chronic kidney disease: Secondary | ICD-10-CM | POA: Diagnosis present

## 2020-05-10 DIAGNOSIS — Z66 Do not resuscitate: Secondary | ICD-10-CM | POA: Diagnosis present

## 2020-05-10 DIAGNOSIS — Z7189 Other specified counseling: Secondary | ICD-10-CM | POA: Diagnosis not present

## 2020-05-10 DIAGNOSIS — N1831 Chronic kidney disease, stage 3a: Secondary | ICD-10-CM | POA: Diagnosis present

## 2020-05-10 DIAGNOSIS — Z91130 Patient's unintentional underdosing of medication regimen due to age-related debility: Secondary | ICD-10-CM | POA: Diagnosis not present

## 2020-05-10 DIAGNOSIS — F0280 Dementia in other diseases classified elsewhere without behavioral disturbance: Secondary | ICD-10-CM | POA: Diagnosis present

## 2020-05-10 DIAGNOSIS — R600 Localized edema: Secondary | ICD-10-CM | POA: Diagnosis present

## 2020-05-10 DIAGNOSIS — R54 Age-related physical debility: Secondary | ICD-10-CM | POA: Diagnosis present

## 2020-05-10 DIAGNOSIS — R1312 Dysphagia, oropharyngeal phase: Secondary | ICD-10-CM | POA: Diagnosis present

## 2020-05-10 DIAGNOSIS — Z888 Allergy status to other drugs, medicaments and biological substances status: Secondary | ICD-10-CM | POA: Diagnosis not present

## 2020-05-10 DIAGNOSIS — J690 Pneumonitis due to inhalation of food and vomit: Secondary | ICD-10-CM | POA: Diagnosis not present

## 2020-05-10 DIAGNOSIS — Z515 Encounter for palliative care: Secondary | ICD-10-CM | POA: Diagnosis not present

## 2020-05-10 DIAGNOSIS — G9341 Metabolic encephalopathy: Secondary | ICD-10-CM | POA: Diagnosis present

## 2020-05-10 DIAGNOSIS — Z6828 Body mass index (BMI) 28.0-28.9, adult: Secondary | ICD-10-CM

## 2020-05-10 DIAGNOSIS — I10 Essential (primary) hypertension: Secondary | ICD-10-CM | POA: Diagnosis not present

## 2020-05-10 DIAGNOSIS — N179 Acute kidney failure, unspecified: Secondary | ICD-10-CM | POA: Diagnosis present

## 2020-05-10 DIAGNOSIS — Z431 Encounter for attention to gastrostomy: Secondary | ICD-10-CM

## 2020-05-10 DIAGNOSIS — Z880 Allergy status to penicillin: Secondary | ICD-10-CM | POA: Diagnosis not present

## 2020-05-10 DIAGNOSIS — R0602 Shortness of breath: Secondary | ICD-10-CM

## 2020-05-10 DIAGNOSIS — Z789 Other specified health status: Secondary | ICD-10-CM | POA: Diagnosis not present

## 2020-05-10 DIAGNOSIS — N400 Enlarged prostate without lower urinary tract symptoms: Secondary | ICD-10-CM | POA: Diagnosis present

## 2020-05-10 DIAGNOSIS — R609 Edema, unspecified: Secondary | ICD-10-CM | POA: Diagnosis not present

## 2020-05-10 DIAGNOSIS — E8809 Other disorders of plasma-protein metabolism, not elsewhere classified: Secondary | ICD-10-CM | POA: Diagnosis not present

## 2020-05-10 DIAGNOSIS — R4182 Altered mental status, unspecified: Secondary | ICD-10-CM | POA: Diagnosis not present

## 2020-05-10 DIAGNOSIS — J9602 Acute respiratory failure with hypercapnia: Secondary | ICD-10-CM

## 2020-05-10 LAB — URINALYSIS, ROUTINE W REFLEX MICROSCOPIC
Bilirubin Urine: NEGATIVE
Glucose, UA: NEGATIVE mg/dL
Hgb urine dipstick: NEGATIVE
Ketones, ur: 5 mg/dL — AB
Leukocytes,Ua: NEGATIVE
Nitrite: NEGATIVE
Protein, ur: NEGATIVE mg/dL
Specific Gravity, Urine: 1.021 (ref 1.005–1.030)
pH: 5 (ref 5.0–8.0)

## 2020-05-10 LAB — COMPREHENSIVE METABOLIC PANEL
ALT: <5 U/L (ref 0–44)
AST: 18 U/L (ref 15–41)
Albumin: 2.8 g/dL — ABNORMAL LOW (ref 3.5–5.0)
Alkaline Phosphatase: 46 U/L (ref 38–126)
Anion gap: 9 (ref 5–15)
BUN: 36 mg/dL — ABNORMAL HIGH (ref 8–23)
CO2: 21 mmol/L — ABNORMAL LOW (ref 22–32)
Calcium: 8.5 mg/dL — ABNORMAL LOW (ref 8.9–10.3)
Chloride: 106 mmol/L (ref 98–111)
Creatinine, Ser: 2.53 mg/dL — ABNORMAL HIGH (ref 0.61–1.24)
GFR, Estimated: 25 mL/min — ABNORMAL LOW (ref >60)
Glucose, Bld: 117 mg/dL — ABNORMAL HIGH (ref 70–99)
Potassium: 3.5 mmol/L (ref 3.5–5.1)
Sodium: 136 mmol/L (ref 135–145)
Total Bilirubin: 1.1 mg/dL (ref 0.3–1.2)
Total Protein: 5.7 g/dL — ABNORMAL LOW (ref 6.5–8.1)

## 2020-05-10 LAB — CBC WITH DIFFERENTIAL/PLATELET
Abs Immature Granulocytes: 0.01 10*3/uL (ref 0.00–0.07)
Basophils Absolute: 0 10*3/uL (ref 0.0–0.1)
Basophils Relative: 0 %
Eosinophils Absolute: 0 10*3/uL (ref 0.0–0.5)
Eosinophils Relative: 0 %
HCT: 31.2 % — ABNORMAL LOW (ref 39.0–52.0)
Hemoglobin: 10.8 g/dL — ABNORMAL LOW (ref 13.0–17.0)
Immature Granulocytes: 0 %
Lymphocytes Relative: 20 %
Lymphs Abs: 1.4 10*3/uL (ref 0.7–4.0)
MCH: 30 pg (ref 26.0–34.0)
MCHC: 34.6 g/dL (ref 30.0–36.0)
MCV: 86.7 fL (ref 80.0–100.0)
Monocytes Absolute: 0.6 10*3/uL (ref 0.1–1.0)
Monocytes Relative: 9 %
Neutro Abs: 4.9 10*3/uL (ref 1.7–7.7)
Neutrophils Relative %: 71 %
Platelets: 174 10*3/uL (ref 150–400)
RBC: 3.6 MIL/uL — ABNORMAL LOW (ref 4.22–5.81)
RDW: 11.7 % (ref 11.5–15.5)
WBC: 6.9 10*3/uL (ref 4.0–10.5)
nRBC: 0 % (ref 0.0–0.2)

## 2020-05-10 LAB — CK: Total CK: 261 U/L (ref 49–397)

## 2020-05-10 MED ORDER — HYDRALAZINE HCL 50 MG PO TABS: 100.0000 mg | ORAL_TABLET | Freq: Three times a day (TID) | ORAL | Status: DC

## 2020-05-10 MED ORDER — ENOXAPARIN SODIUM 30 MG/0.3ML ~~LOC~~ SOLN
30.0000 mg | Freq: Every day | SUBCUTANEOUS | Status: DC
  Administered 2020-05-11 – 2020-05-12 (×2): 30 mg via SUBCUTANEOUS
  Filled 2020-05-10 (×2): qty 0.3

## 2020-05-10 MED ORDER — SODIUM CHLORIDE 0.9% FLUSH
3.0000 mL | Freq: Two times a day (BID) | INTRAVENOUS | Status: DC
  Administered 2020-05-11 – 2020-05-31 (×32): 3 mL via INTRAVENOUS

## 2020-05-10 MED ORDER — RIVASTIGMINE TARTRATE 1.5 MG PO CAPS
1.5000 mg | ORAL_CAPSULE | Freq: Two times a day (BID) | ORAL | Status: DC
  Filled 2020-05-10: qty 1

## 2020-05-10 MED ORDER — SODIUM CHLORIDE 0.9 % IV BOLUS
1000.0000 mL | Freq: Once | INTRAVENOUS | Status: AC
  Administered 2020-05-10: 1000 mL via INTRAVENOUS

## 2020-05-10 MED ORDER — VITAMIN B-12 1000 MCG PO TABS: 1000.0000 ug | ORAL_TABLET | Freq: Every day | ORAL | Status: DC

## 2020-05-10 MED ORDER — CARVEDILOL 6.25 MG PO TABS: 6.2500 mg | ORAL_TABLET | Freq: Two times a day (BID) | ORAL | Status: DC

## 2020-05-10 MED ORDER — SODIUM CHLORIDE 0.9 % IV SOLN: INTRAVENOUS | Status: DC

## 2020-05-10 MED ORDER — ASPIRIN 81 MG PO CHEW: 81.0000 mg | CHEWABLE_TABLET | Freq: Every day | ORAL | Status: DC

## 2020-05-10 MED ORDER — CARBIDOPA-LEVODOPA 25-100 MG PO TABS
1.0000 | ORAL_TABLET | Freq: Three times a day (TID) | ORAL | Status: DC
  Filled 2020-05-10 (×6): qty 1

## 2020-05-10 MED ORDER — VITAMIN D 25 MCG (1000 UNIT) PO TABS
1000.0000 [IU] | ORAL_TABLET | Freq: Every day | ORAL | Status: DC
  Administered 2020-05-13 – 2020-05-14 (×2): 1000 [IU] via ORAL
  Filled 2020-05-10 (×2): qty 1

## 2020-05-10 NOTE — ED Triage Notes (Addendum)
Patient was seen and discharged from [facility] from 11/10-11/16 with altered mental status. Patient was sent to rehab to receive physical therapy and regain strength to go home. At baseline, pt is able to ambulate with walker at home, however, since the previous admission, patient's baseline has been only responsive to pain. EMS states that rehab facility notes no changes from the time he was sent to their facility to today. However, family states that this is not his normal. VSS. Pt is responsive to name in triage but will not open eyes on command. CBG 86 per EMS. Hx parkinsons and dementia. Pt covid + on 11/10

## 2020-05-10 NOTE — H&P (Signed)
History and Physical   [name] WUJ:[id number] DOB: 1940-08-03 DOA: 05/10/2020  PCP: [name], MD   Patient coming from: Nursing home  Chief Complaint: Altered mental status  HPI: [name] is a 79 y.o. male with medical history significant of BPH, venous insufficiency, CKD, Parkinson's dementia, hypertension, falls, neutropenia, thrombocytopenia, recent admission (from 11/10-11/16 due to altered mental status, findings of old CVA, syncope, incidental Covid infection) who presents from his rehab facility with altered mental status.  Patient unable to participate in HPI due to altered mental status, history obtained via chart review and with assistance of family.  His wife states that she spoke with him by phone on Monday and he seemed to be his normal self.  However, when she went to see him at the facility on Wednesday he was no longer opening his eyes or speaking to her.  According to chart review, he was still alert on Tuesday when he was discharged.  Per chart review, facility.  Stated he had been nonresponsive since admission, but this is difficult to confirm.  He has had minimal p.o. intake or medications.  ED Course: Vital signs significant for hypertension to the 170s systolic, otherwise within normal limits.  Labs showed creatinine 2.53 from baseline of 1.5.  Hemoglobin 10.8 baseline of 1.6.  Calcium 8.5 with albumin 2.8.  UA within normal limits, CK within normal limits, RVP with known Covid.  CT head showed no acute abnormality.  Review of Systems: Unable to perform full HPI due to patient's altered mental status  Past Medical History:  Diagnosis Date  . B12 deficiency 11/19/2018  . Benign prostatic hypertrophy   . Edema   . Hyperlipidemia   . Hypertension   . Insomnia   . Parasomnia 10/14/2011   PSG 12/15/11>>AHI 0.4, SpO2 low 89%, PLMI 16, somnoliloquay, nocturia   . Prediabetes 06/10/2012  . Seasonal allergies   . Sepsis (HCC) 01/2019    Past  Surgical History:  Procedure Laterality Date  . CYSTOSCOPY/URETEROSCOPY/HOLMIUM LASER/STENT PLACEMENT Left 07/05/2019   Procedure: CYSTOSCOPY/URETEROSCOPY/STENT PLACEMENT/REROGRADE;  Surgeon: Crista Elliot, MD;  Location: [facility];  Service: Urology;  Laterality: Left;  . FINGER SURGERY Right 04/19/2014   RIGHT INDEX & RING FINGER   FROM DOG BITE   . I & D EXTREMITY Right 04/20/2014   Procedure: IRRIGATION AND DEBRIDEMENT right index finger and right ring finger;  Surgeon: Dominica Severin, MD;  Location: [facility] OR;  Service: Orthopedics;  Laterality: Right;  . I & D EXTREMITY Right 04/21/2014   Procedure: IRRIGATION AND DEBRIDEMENT RIGHT HAND RING FINGER, INDEX FINGER AND Flexor Tenolysis and FDS Tenotomy;  Surgeon: Dominica Severin, MD;  Location: [facility] OR;  Service: Orthopedics;  Laterality: Right;    Social History  reports that he has quit smoking. His smoking use included cigarettes and cigars. He has a 7.00 pack-year smoking history. He has never used smokeless tobacco. He reports previous alcohol use. He reports that he does not use drugs.  Allergies  Allergen Reactions  . Amlodipine Other (See Comments)    Edema w/10 mg  . Lorazepam Other (See Comments)    DO NOT GIVE BENZO TO [facility]ARKINSON PT WITH CONCERNS FOR LEWY BODY DEMENTIA, CAUSES PARADOXICAL WORSENING OF THEIR AGITATION!  . Penicillins Hives    Has patient had a PCN reaction causing immediate rash, facial/tongue/throat swelling, SOB or lightheadedness with hypotension: Yes Has patient had a PCN reaction causing severe rash involving mucus membranes or skin necrosis: No Has patient had  a PCN reaction that required hospitalization No Has patient had a PCN reaction occurring within the last 10 years: No If all of the above answers are "NO", then may proceed with Cephalosporin use.   . Sulfonamide Derivatives Hives  . Atorvastatin     REACTION: leg cramps  . Sulfa Antibiotics Other (See Comments) and Palpitations    Sweating, passed  out  . Telmisartan Other (See Comments)    ?fatigue    Family History  Problem Relation Age of Onset  . Diabetes Mother   . Tremor Father   . Diabetes Other   Reviewed on admission  Prior to Admission medications   Medication Sig Start Date End Date Taking? Authorizing Provider  aspirin 81 MG chewable tablet Chew 1 tablet (81 mg total) by mouth daily. 05/09/20   Leroy Sea, MD  carbidopa-levodopa (SINEMET IR) 25-100 MG tablet Take 1 tablet three times a day with meals Patient taking differently: Take 1 tablet by mouth 3 (three) times daily.  10/19/19   Van Clines, MD  carvedilol (COREG) 6.25 MG tablet Take 1 tablet (6.25 mg total) by mouth 2 (two) times daily with a meal. 05/09/20   Leroy Sea, MD  Cholecalciferol (VITAMIN D) 1000 UNITS capsule Take 1 capsule (1,000 Units total) by mouth daily. 09/09/12   Plotnikov, Georgina Quint, MD  cyanocobalamin (,VITAMIN B-12,) 1000 MCG/ML injection 1 ml sq for 5 days, then 1 ml weekly for 4 weeks, then 1 ml every 2 weeks Patient taking differently: Inject 1,000 mcg into the muscle every 14 (fourteen) days.  11/19/18   Plotnikov, Georgina Quint, MD  hydrALAZINE (APRESOLINE) 100 MG tablet Take 1 tablet (100 mg total) by mouth every 8 (eight) hours. 05/09/20   Leroy Sea, MD  loratadine-pseudoephedrine (CLARITIN-D 24-HOUR) 10-240 MG 24 hr tablet Take 1 tablet by mouth daily as needed for allergies.    [provider]  rivastigmine (EXELON) 1.5 MG capsule Take 1 capsule (1.5 mg total) by mouth 2 (two) times daily. 10/19/19   Van Clines, MD  rosuvastatin (CRESTOR) 10 MG tablet Take 1 tablet (10 mg total) by mouth daily. 05/09/20   Leroy Sea, MD  SYRINGE-NEEDLE, DISP, 3 ML (BD ECLIPSE SYRINGE) 25G X 1" 3 ML MISC Use sq as directed 11/19/18   Plotnikov, Georgina Quint, MD  torsemide (DEMADEX) 100 MG tablet Take 0.5-1 tablets (50-100 mg total) by mouth daily as needed. Patient taking differently: Take 50-100 mg by mouth daily as  needed (swelling in ankles).  04/13/20   Plotnikov, Georgina Quint, MD  traZODone (DESYREL) 50 MG tablet TAKE 2 TABLETS BY MOUTH AT BEDTIME Patient taking differently: Take 100 mg by mouth at bedtime.  10/19/19   Van Clines, MD  valACYclovir (VALTREX) 1000 MG tablet Take 1 tablet (1,000 mg total) by mouth 3 (three) times daily. Patient not taking: Reported on 05/04/2020 11/17/19   Plotnikov, Georgina Quint, MD  vitamin B-12 (CYANOCOBALAMIN) 1000 MCG tablet Take 1,000 mcg by mouth daily.    [provider]    Physical Exam: Vitals:   05/10/20 2230 05/10/20 2245 05/10/20 2300 05/11/20 0043  BP: (!) 144/60 (!) 133/57 128/71 (!) 173/79  Pulse: 77 74 74 92  Resp: 16 16 17 14   Temp:    98.8 F (37.1 C)  TempSrc:    Axillary  SpO2: 96% 94% 95% 97%  Weight:    71.1 kg   Physical Exam Constitutional:      General: He is not  in acute distress.    Appearance: Normal appearance.     Comments: Chronically ill appearing elderly male, response to voice with grunt or "yea", does not open eyes  HENT:     Head: Normocephalic and atraumatic.     Mouth/Throat:     Mouth: Mucous membranes are moist.     Pharynx: Oropharynx is clear.  Eyes:     Extraocular Movements: Extraocular movements intact.     Pupils: Pupils are equal, round, and reactive to light.  Cardiovascular:     Rate and Rhythm: Normal rate and regular rhythm.     Pulses: Normal pulses.     Heart sounds: Normal heart sounds.  Pulmonary:     Effort: Pulmonary effort is normal. No respiratory distress.     Breath sounds: Normal breath sounds.  Abdominal:     General: Bowel sounds are normal. There is no distension.     Palpations: Abdomen is soft.     Tenderness: There is no abdominal tenderness.  Musculoskeletal:        General: No swelling or deformity.  Skin:    General: Skin is warm and dry.  Neurological:     General: No focal deficit present.     Comments: Cogwheel rigidity, no significant increased muscle tone at rest     Labs on Admission: I have personally reviewed following labs and imaging studies  CBC: Recent Labs  Lab 05/05/20 0401 05/05/20 0401 05/06/20 0624 05/07/20 0106 05/08/20 0127 05/09/20 0447 05/10/20 1715  WBC 2.4*   < > 2.6* 4.6 2.7* 3.5* 6.9  NEUTROABS 1.5*  --   --  3.2 1.1* 1.8 4.9  HGB 11.2*   < > 10.6* 10.4* 11.2* 11.6* 10.8*  HCT 31.4*   < > 29.9* 29.7* 31.9* 32.5* 31.2*  MCV 85.6   < > 85.2 86.3 83.9 82.7 86.7  PLT 101*   < > 86* 96* 105* 134* 174   < > = values in this interval not displayed.    Basic Metabolic Panel: Recent Labs  Lab 05/04/20 0354 05/04/20 0354 05/05/20 0401 05/05/20 0401 05/06/20 0624 05/07/20 0106 05/08/20 0127 05/09/20 0447 05/10/20 1715  NA 135   < > 137   < > 137 139 137 137 136  K 3.0*   < > 3.3*   < > 3.5 3.9 3.8 3.4* 3.5  CL 102   < > 105   < > 107 109 105 104 106  CO2 24   < > 26   < > 21*  GLUCOSE 102*   < > 106*   < > 93 101* 104* 103* 117*  BUN 18   < > 18   < > 36*  CREATININE 1.51*   < > 1.56*   < > 1.57* 1.70* 1.49* 1.47* 2.53*  CALCIUM 8.1*   < > 8.3*   < > 8.0* 8.1* 8.6* 8.6* 8.5*  MG 1.7  --  2.0  --   --  1.5* 1.5* 2.2  --    < > = values in this interval not displayed.    GFR: Estimated Creatinine Clearance: 22.5 mL/min (A) (by C-G formula based on SCr of 2.53 mg/dL (H)).  Liver Function Tests: Recent Labs  Lab 05/07/20 0106 05/08/20 0127 05/09/20 0447 05/10/20 1715  AST 14* ALT <5 <5 6 <5  ALKPHOS 51 51 54 46  BILITOT 1.2 1.2 1.1 1.1  PROT 4.7* 5.4*  5.3* 5.7*  ALBUMIN 2.6* 2.8* 2.8* 2.8*    Urine analysis:    Component Value Date/Time   COLORURINE AMBER (A) 05/10/2020 2227   APPEARANCEUR HAZY (A) 05/10/2020 2227   LABSPEC 1.021 05/10/2020 2227   PHURINE 5.0 05/10/2020 2227   GLUCOSEU NEGATIVE 05/10/2020 2227   GLUCOSEU NEGATIVE 08/04/2018 0949   HGBUR NEGATIVE 05/10/2020 2227   BILIRUBINUR NEGATIVE 05/10/2020 2227   KETONESUR 5 (A) 05/10/2020 2227   PROTEINUR  NEGATIVE 05/10/2020 2227   UROBILINOGEN 1.0 08/04/2018 0949   NITRITE NEGATIVE 05/10/2020 2227   LEUKOCYTESUR NEGATIVE 05/10/2020 2227    Radiological Exams on Admission: CT Head Wo Contrast  Result Date: 05/10/2020 CLINICAL DATA:  Mental status changes EXAM: CT HEAD WITHOUT CONTRAST TECHNIQUE: Contiguous axial images were obtained from the base of the skull through the vertex without intravenous contrast. COMPARISON:  05/03/2020 FINDINGS: Brain: There is atrophy and chronic small vessel disease changes. No acute intracranial abnormality. Specifically, no hemorrhage, hydrocephalus, mass lesion, acute infarction, or significant intracranial injury. Vascular: No hyperdense vessel or unexpected calcification. Skull: No acute calvarial abnormality. Sinuses/Orbits: Visualized paranasal sinuses and mastoids clear. Orbital soft tissues unremarkable. Other: None IMPRESSION: Atrophy, chronic microvascular disease. No acute intracranial abnormality. Electronically Signed   By: Charlett NoseKevin  Dover M.D.   On: 05/10/2020 19:26    EKG: Independently reviewed. sinus rhythm with borderline QTC of 478  Assessment/Plan Principal Problem:   Acute encephalopathy Active Problems:   Essential hypertension   Chronic venous insufficiency   Dementia due to Parkinson's disease without behavioral disturbance (HCC)   Parkinson's syndrome (HCC)   History of CVA (cerebrovascular accident)  Acute encephalopathy Parkinson's dementia > In the setting of Parkinson's dementia and recent admission with similar symptoms > Reportedly started doing worse after arriving to facility > Unclear if this could be due to waxing and waning symptoms from recent admission versus his intake and care at facility > Currently afebrile without other signs of infection, CT head stable, UA with normal limits - We will provide supportive care for now - Monitor response in the a.m. - Chest x-ray and CRP for completeness - Continue current  carbidopa levodopa when able to swallow, but stop rivastigmine for now - Patient did not become responsive and is unable to take his medications may need to speak with neurology for alternative options - Avoid benzos and narcotics - AM BMP and CBC  History of CVA > Old infarction noted on previous admission with no acute changes -Continue aspirin, holding Crestor that he was on a trial of due to allergy to atorvastatin  AKI on CKD 3 > Creatinine 2.53 (baseline 1.5), in the setting of decreased p.o. intake for the last day and half or so. > Received 1 L IV fluids in the ED - Continue NS at 125 cc/h - Avoid nephrotoxic agents - AM BMP  Hypertension > Hypertensive to 170's systolic in ED - Continue hydralazine and carvedilol - Will need addition of IV as needed if remains unable to take p.o.  Venous insufficiency - Holding as needed torsemide given AKI  DVT prophylaxis: Lovenox  Code Status:   Full, confirmed by spouse despite previous DNR status in chart  Family Communication:  Called spouse, [name] at [phone], and updated her by phone Disposition Plan:   Patient is from:  Rehab facility  Anticipated DC to:  Rehab facility  Anticipated DC date:  Pending clinical course  Anticipated DC barriers:         Parkinson's disease, Covid positive  Consults called:  None Admission status:  Inpatient, telemetry  Severity of Illness: The appropriate patient status for this patient is INPATIENT. Inpatient status is judged to be reasonable and necessary in order to provide the required intensity of service to ensure the patient's safety. The patient's presenting symptoms, physical exam findings, and initial radiographic and laboratory data in the context of their chronic comorbidities is felt to place them at high risk for further clinical deterioration. Furthermore, it is not anticipated that the patient will be medically stable for discharge from the hospital within 2 midnights of  admission. The following factors support the patient status of inpatient.   " The patient's presenting symptoms include encephalopathy. " The worrisome physical exam findings include encephalopathy, will respond to voice but will not open eyes and speaks intelligibly and likely nonsense words. " The initial radiographic and laboratory data are worrisome because of AKI. " The chronic co-morbidities include Parkinson's disease, CKD, hypertension, venous insufficiency.   * I certify that at the point of admission it is my clinical judgment that the patient will require inpatient hospital care spanning beyond 2 midnights from the point of admission due to high intensity of service, high risk for further deterioration and high frequency of surveillance required.Synetta Fail MD [facility]  How to contact the [facility] Attending or Consulting provider 7A - 7P or covering provider during after hours 7P -7A, for this patient?   1. Check the care team in [facility] and look for a) attending/consulting [facility] provider listed and b) the [facility] team listed 2. Log into [email or web address] and use [facility]'s universal password to access. If you do not have the password, please contact the hospital operator. 3. Locate the [facility] provider you are looking for under [facility] and page to a number that you can be directly reached. 4. If you still have difficulty reaching the provider, please page the [facility] (Director on Call) for the Hospitalists listed on amion for assistance.  05/11/2020, 1:45 AM

## 2020-05-10 NOTE — ED Provider Notes (Signed)
[facility] EMERGENCY DEPARTMENT Provider Note   CSN: [id number] Arrival date & time: 05/10/20  1609     History Chief Complaint  Patient presents with  . Altered Mental Status    [name] is a 79 y.o. male.  The history is provided by the patient. No language interpreter was used.  Altered Mental Status Presenting symptoms: partial responsiveness   Most recent episode:  Today Timing:  Constant Progression:  Worsening Context: nursing home resident   Associated symptoms: no abdominal pain   Pt discharged from here yesterday to facility.  Pt having weakness and is covid positive.  Facility reports pt minimally responsive since arriving there.   I spoke with pt's wife who reports pt was talking to her on Monday.  She reports pt was walking with a walker before being hospitalized on 11/10.   Past Medical History:  Diagnosis Date  . B12 deficiency 11/19/2018  . Benign prostatic hypertrophy   . Edema   . Hyperlipidemia   . Hypertension   . Insomnia   . Parasomnia 10/14/2011   PSG 12/15/11>>AHI 0.4, SpO2 low 89%, PLMI 16, somnoliloquay, nocturia   . Prediabetes 06/10/2012  . Seasonal allergies   . Sepsis (HCC) 01/2019    Patient Active Problem List   Diagnosis Date Noted  . AMS (altered mental status) 05/04/2020  . Syncope 05/03/2020  . Neutropenia (HCC) 05/03/2020  . Thrombocytopenia (HCC) 05/03/2020  . COVID-19 virus infection 05/03/2020  . Shingles 11/17/2019  . Hydronephrosis with renal and ureteral calculus obstruction 07/06/2019  . Constipation 07/03/2019  . Acute metabolic encephalopathy 07/03/2019  . CRF (chronic renal failure), stage 3 (moderate) 03/18/2019  . Pressure injury of skin 02/14/2019  . ARF (acute renal failure) (HCC) 02/13/2019  . Sepsis (HCC) 02/13/2019  . Protein-calorie malnutrition, severe (HCC) 02/13/2019  . 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 Parkinson's syndrome  (HCC) 02/13/2019  . Infected sebaceous cyst 12/04/2018  . Hallucinations 11/19/2018  . Shoulder pain 11/19/2018  . Vitamin B12 deficiency 11/19/2018  . 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 Memory loss 09/21/2018  . Falls frequently 09/21/2018  . Fatigue 10/27/2017  . Edema 07/29/2017  . Chronic venous insufficiency 06/30/2017  . Cellulitis and abscess of leg, except foot 06/30/2017  . Ear pain, referred, right 06/10/2016  . Urinary frequency 04/16/2016  . Cerumen impaction 08/22/2015  . Infection of right hand due to bite 04/20/2014  . Hemifacial spasm 09/15/2013  . Well adult exam 07/16/2013  . Dermatochalasis of eyelid 01/01/2012  . Other facial nerve disorders 01/01/2012  . 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 Twitching 11/04/2011  . Non-REM Parasomnia 10/14/2011  . Dog bite of left hand 08/19/2011  . Wound infection 08/19/2011  . Hypokalemia 12/10/2010  . Hyperglycemia 12/10/2010  . Dyslipidemia   . Benign prostatic hyperplasia   . LEG CRAMPS 03/07/2009  . OTITIS EXTERNA 08/30/2008  . Prostatitis 08/30/2008  . Essential hypertension 08/26/2007  . ERECTILE DYSFUNCTION 08/26/2007    Past Surgical History:  Procedure Laterality Date  . CYSTOSCOPY/URETEROSCOPY/HOLMIUM LASER/STENT PLACEMENT Left 07/05/2019   Procedure: CYSTOSCOPY/URETEROSCOPY/STENT PLACEMENT/REROGRADE;  Surgeon: Crista Elliot, MD;  Location: [facility];  Service: Urology;  Laterality: Left;  . FINGER SURGERY Right 04/19/2014   RIGHT INDEX & RING FINGER   FROM DOG BITE   . I & D EXTREMITY Right 04/20/2014   Procedure: IRRIGATION AND DEBRIDEMENT right index finger and right ring finger;  Surgeon: Dominica Severin, MD;  Location: [facility] OR;  Service: Orthopedics;  Laterality: Right;  . I & D EXTREMITY Right 04/21/2014   Procedure: IRRIGATION AND DEBRIDEMENT RIGHT HAND RING FINGER, INDEX FINGER AND Flexor Tenolysis and FDS Tenotomy;  Surgeon: Dominica Severin, MD;  Location: [facility] OR;  Service: Orthopedics;   Laterality: Right;       Family History  Problem Relation Age of Onset  . Diabetes Mother   . Tremor Father   . Diabetes Other     Social History   Tobacco Use  . Smoking status: Former Smoker    Packs/day: 1.00    Years: 7.00    Pack years: 7.00    Types: Cigarettes, Cigars  . Smokeless tobacco: Never Used  . Tobacco comment: cigars day/ 1-2       QIUIT SMOKING IN 2000  Vaping Use  . Vaping Use: Never used  Substance Use Topics  . Alcohol use: Not Currently    Comment: OCCASSIONAL WINE -1 glass/month  . Drug use: No    Home Medications Prior to Admission medications   Medication Sig Start Date End Date Taking? Authorizing Provider  aspirin 81 MG chewable tablet Chew 1 tablet (81 mg total) by mouth daily. 05/09/20   Leroy Sea, MD  carbidopa-levodopa (SINEMET IR) 25-100 MG tablet Take 1 tablet three times a day with meals Patient taking differently: Take 1 tablet by mouth 3 (three) times daily.  10/19/19   Van Clines, MD  carvedilol (COREG) 6.25 MG tablet Take 1 tablet (6.25 mg total) by mouth 2 (two) times daily with a meal. 05/09/20   Leroy Sea, MD  Cholecalciferol (VITAMIN D) 1000 UNITS capsule Take 1 capsule (1,000 Units total) by mouth daily. 09/09/12   Plotnikov, Georgina Quint, MD  cyanocobalamin (,VITAMIN B-12,) 1000 MCG/ML injection 1 ml sq for 5 days, then 1 ml weekly for 4 weeks, then 1 ml every 2 weeks Patient taking differently: Inject 1,000 mcg into the muscle every 14 (fourteen) days.  11/19/18   Plotnikov, Georgina Quint, MD  hydrALAZINE (APRESOLINE) 100 MG tablet Take 1 tablet (100 mg total) by mouth every 8 (eight) hours. 05/09/20   Leroy Sea, MD  loratadine-pseudoephedrine (CLARITIN-D 24-HOUR) 10-240 MG 24 hr tablet Take 1 tablet by mouth daily as needed for allergies.    [provider]  rivastigmine (EXELON) 1.5 MG capsule Take 1 capsule (1.5 mg total) by mouth 2 (two) times daily. 10/19/19   Van Clines, MD  rosuvastatin  (CRESTOR) 10 MG tablet Take 1 tablet (10 mg total) by mouth daily. 05/09/20   Leroy Sea, MD  SYRINGE-NEEDLE, DISP, 3 ML (BD ECLIPSE SYRINGE) 25G X 1" 3 ML MISC Use sq as directed 11/19/18   Plotnikov, Georgina Quint, MD  torsemide (DEMADEX) 100 MG tablet Take 0.5-1 tablets (50-100 mg total) by mouth daily as needed. Patient taking differently: Take 50-100 mg by mouth daily as needed (swelling in ankles).  04/13/20   Plotnikov, Georgina Quint, MD  traZODone (DESYREL) 50 MG tablet TAKE 2 TABLETS BY MOUTH AT BEDTIME Patient taking differently: Take 100 mg by mouth at bedtime.  10/19/19   Van Clines, MD  valACYclovir (VALTREX) 1000 MG tablet Take 1 tablet (1,000 mg total) by mouth 3 (three) times daily. Patient not taking: Reported on 05/04/2020 11/17/19   Plotnikov, Georgina Quint, MD  vitamin B-12 (CYANOCOBALAMIN) 1000 MCG tablet Take 1,000 mcg by mouth daily.    [provider]    Allergies    Amlodipine, Lorazepam,  Penicillins, Sulfonamide derivatives, Atorvastatin, Sulfa antibiotics, and Telmisartan  Review of Systems   Review of Systems  Gastrointestinal: Negative for abdominal pain.  All other systems reviewed and are negative.   Physical Exam Updated Vital Signs BP 112/60   Pulse 66   Temp 98.9 F (37.2 C) (Axillary) Comment: Simultaneous filing. User may not have seen previous data. Comment (Src): Simultaneous filing. User may not have seen previous data.  Resp 17   SpO2 96%   Physical Exam Vitals and nursing note reviewed.  Constitutional:      Appearance: He is well-developed.  HENT:     Head: Normocephalic and atraumatic.  Eyes:     Conjunctiva/sclera: Conjunctivae normal.  Cardiovascular:     Rate and Rhythm: Normal rate and regular rhythm.     Heart sounds: No murmur heard.   Pulmonary:     Effort: Pulmonary effort is normal. No respiratory distress.     Breath sounds: Normal breath sounds.  Abdominal:     Palpations: Abdomen is soft.  Skin:    General:  Skin is warm and dry.  Neurological:     Mental Status: He is alert.     Comments: Pt responds to painful stimulus,      ED Results / Procedures / Treatments   Labs (all labs ordered are listed, but only abnormal results are displayed) Labs Reviewed  CBC WITH DIFFERENTIAL/PLATELET  COMPREHENSIVE METABOLIC PANEL    EKG EKG Interpretation  Date/Time:  Wednesday May 10 2020 16:24:46 EST Ventricular Rate:  73 PR Interval:    QRS Duration: 97 QT Interval:  433 QTC Calculation: 478 R Axis:   1 Text Interpretation: Sinus rhythm Abnormal R-wave progression, early transition Borderline prolonged QT interval Confirmed by Kennis Carina [phone]) on 05/10/2020 4:56:24 PM   Radiology No results found.  Procedures Procedures (including critical care time)  Medications Ordered in ED Medications - No data to display  ED Course  I have reviewed the triage vital signs and the nursing notes.  Pertinent labs & imaging results that were available during my care of the patient were reviewed by me and considered in my medical decision making (see chart for details).    MDM Rules/Calculators/A&P                          MDM:  Dr. Pilar Plate in to see and examine pt.  Labs and ct ordered.   Final Clinical Impression(s) / ED Diagnoses Final diagnoses:  Altered mental status, unspecified altered mental status type    Rx / DC Orders ED Discharge Orders    None       Elson Areas, [place] 05/10/20 1813    Sabas Sous, MD 05/11/20 0110

## 2020-05-10 NOTE — ED Notes (Signed)
Patient transported to CT 

## 2020-05-11 ENCOUNTER — Inpatient Hospital Stay (HOSPITAL_COMMUNITY): Payer: Medicare Other

## 2020-05-11 DIAGNOSIS — E785 Hyperlipidemia, unspecified: Secondary | ICD-10-CM

## 2020-05-11 DIAGNOSIS — I129 Hypertensive chronic kidney disease with stage 1 through stage 4 chronic kidney disease, or unspecified chronic kidney disease: Secondary | ICD-10-CM | POA: Diagnosis not present

## 2020-05-11 DIAGNOSIS — Z9181 History of falling: Secondary | ICD-10-CM

## 2020-05-11 DIAGNOSIS — F0280 Dementia in other diseases classified elsewhere without behavioral disturbance: Secondary | ICD-10-CM

## 2020-05-11 DIAGNOSIS — G20 Parkinson's disease: Secondary | ICD-10-CM | POA: Diagnosis not present

## 2020-05-11 DIAGNOSIS — I10 Essential (primary) hypertension: Secondary | ICD-10-CM | POA: Diagnosis not present

## 2020-05-11 DIAGNOSIS — E538 Deficiency of other specified B group vitamins: Secondary | ICD-10-CM

## 2020-05-11 DIAGNOSIS — N400 Enlarged prostate without lower urinary tract symptoms: Secondary | ICD-10-CM

## 2020-05-11 DIAGNOSIS — N529 Male erectile dysfunction, unspecified: Secondary | ICD-10-CM

## 2020-05-11 DIAGNOSIS — K5900 Constipation, unspecified: Secondary | ICD-10-CM

## 2020-05-11 DIAGNOSIS — N183 Chronic kidney disease, stage 3 unspecified: Secondary | ICD-10-CM | POA: Diagnosis not present

## 2020-05-11 DIAGNOSIS — E43 Unspecified severe protein-calorie malnutrition: Secondary | ICD-10-CM

## 2020-05-11 DIAGNOSIS — Z8673 Personal history of transient ischemic attack (TIA), and cerebral infarction without residual deficits: Secondary | ICD-10-CM

## 2020-05-11 DIAGNOSIS — I872 Venous insufficiency (chronic) (peripheral): Secondary | ICD-10-CM

## 2020-05-11 LAB — CBC
HCT: 30.8 % — ABNORMAL LOW (ref 39.0–52.0)
Hemoglobin: 10.6 g/dL — ABNORMAL LOW (ref 13.0–17.0)
MCH: 29.7 pg (ref 26.0–34.0)
MCHC: 34.4 g/dL (ref 30.0–36.0)
MCV: 86.3 fL (ref 80.0–100.0)
Platelets: 182 10*3/uL (ref 150–400)
RBC: 3.57 MIL/uL — ABNORMAL LOW (ref 4.22–5.81)
RDW: 11.6 % (ref 11.5–15.5)
WBC: 5.6 10*3/uL (ref 4.0–10.5)
nRBC: 0 % (ref 0.0–0.2)

## 2020-05-11 LAB — COMPREHENSIVE METABOLIC PANEL
ALT: 6 U/L (ref 0–44)
AST: 19 U/L (ref 15–41)
Albumin: 2.7 g/dL — ABNORMAL LOW (ref 3.5–5.0)
Alkaline Phosphatase: 48 U/L (ref 38–126)
Anion gap: 8 (ref 5–15)
BUN: 36 mg/dL — ABNORMAL HIGH (ref 8–23)
CO2: 23 mmol/L (ref 22–32)
Calcium: 8.7 mg/dL — ABNORMAL LOW (ref 8.9–10.3)
Chloride: 108 mmol/L (ref 98–111)
Creatinine, Ser: 2.22 mg/dL — ABNORMAL HIGH (ref 0.61–1.24)
GFR, Estimated: 29 mL/min — ABNORMAL LOW (ref >60)
Glucose, Bld: 90 mg/dL (ref 70–99)
Potassium: 3.3 mmol/L — ABNORMAL LOW (ref 3.5–5.1)
Sodium: 139 mmol/L (ref 135–145)
Total Bilirubin: 1 mg/dL (ref 0.3–1.2)
Total Protein: 5.5 g/dL — ABNORMAL LOW (ref 6.5–8.1)

## 2020-05-11 LAB — C-REACTIVE PROTEIN: CRP: 11.5 mg/dL — ABNORMAL HIGH (ref <1.0)

## 2020-05-11 LAB — RESPIRATORY PANEL BY RT PCR (FLU A&B, COVID)
Influenza A by PCR: NEGATIVE
Influenza B by PCR: NEGATIVE
SARS Coronavirus 2 by RT PCR: POSITIVE — AB

## 2020-05-11 MED ORDER — KCL IN DEXTROSE-NACL 20-5-0.9 MEQ/L-%-% IV SOLN
INTRAVENOUS | Status: DC
  Filled 2020-05-11 (×10): qty 1000

## 2020-05-11 MED ORDER — HYDRALAZINE HCL 50 MG PO TABS: 100.0000 mg | ORAL_TABLET | Freq: Three times a day (TID) | ORAL | Status: DC

## 2020-05-11 MED ORDER — HYDRALAZINE HCL 20 MG/ML IJ SOLN: 10.0000 mg | INTRAMUSCULAR | Status: DC | PRN

## 2020-05-11 MED ORDER — HYDRALAZINE HCL 20 MG/ML IJ SOLN
5.0000 mg | INTRAMUSCULAR | Status: DC | PRN
  Administered 2020-05-11 – 2020-05-14 (×4): 5 mg via INTRAVENOUS
  Filled 2020-05-11 (×4): qty 1

## 2020-05-11 NOTE — Plan of Care (Signed)
  Problem: Education: Goal: Knowledge of General Education information will improve Description: Including pain rating scale, medication(s)/side effects and non-pharmacologic comfort measures Outcome: Not Progressing   Problem: Health Behavior/Discharge Planning: Goal: Ability to manage health-related needs will improve Outcome: Not Progressing   Problem: Clinical Measurements: Goal: Ability to maintain clinical measurements within normal limits will improve Outcome: Progressing Goal: Will remain free from infection Outcome: Progressing Goal: Diagnostic test results will improve Outcome: Progressing Goal: Respiratory complications will improve Outcome: Progressing Goal: Cardiovascular complication will be avoided Outcome: Progressing   Problem: Activity: Goal: Risk for activity intolerance will decrease Outcome: Not Progressing   Problem: Nutrition: Goal: Adequate nutrition will be maintained Outcome: Progressing   Problem: Coping: Goal: Level of anxiety will decrease Outcome: Progressing   Problem: Elimination: Goal: Will not experience complications related to bowel motility Outcome: Progressing Goal: Will not experience complications related to urinary retention Outcome: Progressing   Problem: Pain Managment: Goal: General experience of comfort will improve Outcome: Progressing   Problem: Safety: Goal: Ability to remain free from injury will improve Outcome: Progressing   Problem: Skin Integrity: Goal: Risk for impaired skin integrity will decrease Outcome: Progressing   

## 2020-05-11 NOTE — TOC Initial Note (Signed)
Transition of Care [facility]) - Initial/Assessment Note    Patient Details  Name: [name] MRN: [id number] Date of Birth: 08-25-1940  [facility] CM/SW Contact:    [place] Pink, Student-Social Work Phone Number: 05/11/2020, 11:29 AM  Clinical Narrative:                 CSW intern called wife to discuss discharge planning needs. Wife indicated that she is very upset and is unsure what to do in regard to her husbands discharge. She reported that she is confused as to why he was was brought back to the hospital so quickly. She seemed to be open to the possibility of SNF at discharge but is currently unsure. Wife also mentioned that patient went to [facility] last year and seemed to like it. She would like a nurse or doctor to call and give her more insight.         Patient Goals and CMS Choice        Expected Discharge Plan and Services                                                Prior Living Arrangements/Services                       Activities of Daily Living      Permission Sought/Granted                  Emotional Assessment              Admission diagnosis:  Acute encephalopathy [G93.40] Altered mental status, unspecified altered mental status type [R41.82] Patient Active Problem List   Diagnosis Date Noted  . History of CVA (cerebrovascular accident) 05/11/2020  . Acute encephalopathy 05/10/2020  . AMS (altered mental status) 05/04/2020  . Syncope 05/03/2020  . Neutropenia (HCC) 05/03/2020  . Thrombocytopenia (HCC) 05/03/2020  . COVID-19 virus infection 05/03/2020  . Shingles 11/17/2019  . Hydronephrosis with renal and ureteral calculus obstruction 07/06/2019  . Constipation 07/03/2019  . Acute metabolic encephalopathy 07/03/2019  . CRF (chronic renal failure), stage 3 (moderate) 03/18/2019  . Pressure injury of skin 02/14/2019  . ARF (acute renal failure) (HCC) 02/13/2019  . Sepsis (HCC) 02/13/2019  .  Protein-calorie malnutrition, severe (HCC) 02/13/2019  . 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 Parkinson's syndrome (HCC) 02/13/2019  . Infected sebaceous cyst 12/04/2018  . Hallucinations 11/19/2018  . Shoulder pain 11/19/2018  . Vitamin B12 deficiency 11/19/2018  . 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 Memory loss 09/21/2018  . Falls frequently 09/21/2018  . Fatigue 10/27/2017  . Edema 07/29/2017  . Chronic venous insufficiency 06/30/2017  . Cellulitis and abscess of leg, except foot 06/30/2017  . Ear pain, referred, right 06/10/2016  . Urinary frequency 04/16/2016  . Cerumen impaction 08/22/2015  . Infection of right hand due to bite 04/20/2014  . Hemifacial spasm 09/15/2013  . Well adult exam 07/16/2013  . Dermatochalasis of eyelid 01/01/2012  . Other facial nerve disorders 01/01/2012  . 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 Twitching 11/04/2011  . Non-REM Parasomnia 10/14/2011  . Dog bite of left hand 08/19/2011  . Wound infection 08/19/2011  . Hypokalemia 12/10/2010  . Hyperglycemia 12/10/2010  . Dyslipidemia   . Benign prostatic hyperplasia   . LEG  CRAMPS 03/07/2009  . OTITIS EXTERNA 08/30/2008  . Prostatitis 08/30/2008  . Essential hypertension 08/26/2007  . ERECTILE DYSFUNCTION 08/26/2007   PCP:  [name], MD Pharmacy:   [facility] [address] [place] [facility] [address] [place] [address] Phone: [phone] Fax: [phone]  [facility] [place], [place] - [address] Portal to Reg[facility] [address] [place] [address] Phone: [phone] Fax: [phone]     Social Determinants of Health (SDOH) Interventions    Readmission Risk Interventions No flowsheet data found.

## 2020-05-11 NOTE — Progress Notes (Addendum)
PROGRESS NOTE                                                                             PROGRESS NOTE                                                                                                                                                                                                             Patient Demographics:    [name], is a 79 y.o. male, DOB - 05/25/1941, ZOX:[id number]  Outpatient Primary MD for the patient is Plotnikov, Georgina Quint, MD    LOS - 1  Admit date - 05/10/2020    Chief Complaint  Patient presents with  . Altered Mental Status       Brief Narrative    79 y.o. male with medical history significant of BPH, venous insufficiency, CKD, Parkinson's dementia, hypertension, falls, neutropenia, thrombocytopenia, recent admission (from 11/10-11/16 due to altered mental status, findings of old CVA, syncope, incidental Covid infection) who presents from his rehab facility with altered mental status.  Patient unable to participate in HPI due to altered mental status, history obtained via chart review and with assistance of family.             His wife states that she spoke with him by phone on Monday and he seemed to be his normal self.  However, when she went to see him at the facility on Wednesday he was no longer opening his eyes or speaking to her.  According to chart review, he was still alert on Tuesday when he was discharged.  Per chart review, facility.  Stated he had been nonresponsive since admission, but this is difficult to confirm.  He has had minimal p.o. intake or medications.  ED Course: Vital signs significant for hypertension to the 170s systolic, otherwise within normal limits.  Labs showed creatinine 2.53 from baseline of 1.5.  Hemoglobin 10.8 baseline of 1.6.  Calcium 8.5 with albumin 2.8.  UA within normal limits, CK within normal limits, RVP with known Covid.  CT  head showed no acute abnormality.   Subjective:     [name] remains altered, cannot provide any complaints, as discussed with staff there is no significant events. .   Assessment  & Plan :    Principal Problem:   Acute encephalopathy Active Problems:   Essential hypertension   Chronic venous insufficiency   Dementia due to Parkinson's disease without behavioral disturbance (HCC)   Parkinson's syndrome (HCC)   History of CVA (cerebrovascular accident)  Acute encephalopathy/ Parkinson's dementia -With underlining Parkinson's dementia, he had recent admission with similar symptoms . -Symptoms appear to be worsened at the facility, he had extensive work-up during recent hospital stay, which did include MRI of the brain which did not show any acute changes, he had stable echo, EEG, carotid ultrasound, TEE, stable A1c, and LDL was around 90, for which he was started on statin and aspirin . -He is with fluctuating mentation, - Currently afebrile without other signs of infection, CT head stable, UA with normal limits -Continue to provide supportive care including IV fluids, monitor electrolytes closely and replete as needed. - Avoid benzos and narcotics  History of CVA - Old infarction noted on previous admission with no acute changes -Continue aspirin,   AKI on CKD 3 -  Creatinine 2.53 (baseline 1.5), in the setting of decreased p.o. intake for the last day and half or so.  Continue with IV fluids and avoid nephrotoxic medications.  Hypertension -Continue with as needed hydralazine currently as he is unable to take oral  Parkinson  disease -Followed by Dr. Karel Jarvis, resume Sinemet,  rivastigmine when able to take oral  Hypokalemia -With potassium of 3.3, will replete with IV fluids  SpO2: 97 %  Recent Labs  Lab 05/07/20 0106 05/08/20 0127 05/09/20 0447 05/10/20 1715 05/10/20 2150 05/11/20 0316  WBC 4.6 2.7* 3.5* 6.9  --  5.6  PLT 96* 105* 134* 174  --  182  CRP 8.0* 9.6* 4.3*  --   --  11.5*  BNP 113.4* 116.8*  126.2*  --   --   --   DDIMER 0.60* 0.76* 0.99*  --   --   --   AST 14* 16 15 18   --  19  ALT <5 <5 6 <5  --  6  ALKPHOS 51 51 54 46  --  48  BILITOT 1.2 1.2 1.1 1.1  --  1.0  ALBUMIN 2.6* 2.8* 2.8* 2.8*  --  2.7*  SARSCOV2NAA  --   --   --   --  POSITIVE*  --        ABG     Component Value Date/Time   TCO2 23 04/22/2011 0342       Condition - Extremely Guarded  Family Communication  : I have called wife today, I did try to inquire about his baseline, she did state ' all of you asking the same questions", I have tried to explain for her that we need to get appropriate history,she was upset, and she did terminate the call.  Code Status :  Full  Consults  :  None  Procedures  :  None   Disposition Plan  :    Status is: Inpatient  Remains inpatient appropriate because:IV treatments appropriate due to intensity of illness or inability to take PO   Dispo: The patient is from: SNF              Anticipated d/c is to: SNF  Anticipated d/c date is: 3 days              Patient currently is not medically stable to d/c.      DVT Prophylaxis  :  Lovenox   Lab Results  Component Value Date   PLT 182 05/11/2020    Diet :  Diet Order            Diet NPO time specified  Diet effective now                  Inpatient Medications  Scheduled Meds: . aspirin  81 mg Oral Daily  . carbidopa-levodopa  1 tablet Oral TID WC  . carvedilol  6.25 mg Oral BID WC  . cholecalciferol  1,000 Units Oral Daily  . enoxaparin (LOVENOX) injection  30 mg Subcutaneous Daily  . hydrALAZINE  100 mg Oral Q8H  . sodium chloride flush  3 mL Intravenous Q12H  . vitamin B-12  1,000 mcg Oral Daily   Continuous Infusions: . sodium chloride 75 mL/hr at 05/11/20 1300   PRN Meds:.hydrALAZINE  Antibiotics  :    Anti-infectives (From admission, onward)   None        Huey Bienenstock M.D on 05/11/2020 at 3:27 PM  To page go to [email or web address]  [facility] -   Office  [phone]     Objective:   Vitals:   05/11/20 0400 05/11/20 0800 05/11/20 0820 05/11/20 1208  BP: (!) 165/81  (!) 170/86 (!) 175/83  Pulse: 83 76 73 72  Resp: Temp: 98.7 F (37.1 C)  98.3 F (36.8 C) 98 F (36.7 C)  TempSrc: Axillary  Axillary Axillary  SpO2: 97% 96% 94% 97%  Weight:        Wt Readings from Last 3 Encounters:  05/11/20 71.1 kg  05/04/20 77.5 kg  04/13/20 77.5 kg     Intake/Output Summary (Last 24 hours) at 05/11/2020 1527 Last data filed at 05/11/2020 1300 Gross per 24 hour  Intake 1303.77 ml  Output --  Net 1303.77 ml     Physical Exam  Patient is having his eyes closed, just keep mumbling yes yes yes to all questions been asked without any interaction or opening his eyes . Good air entry bilaterally  RRR,No Gallops,Rubs or new Murmurs, No Parasternal Heave +ve B.Sounds, Abd Soft, No tenderness, No rebound - guarding or rigidity. No Cyanosis, Clubbing or edema, No new Rash or bruise      Data Review:    CBC Recent Labs  Lab 05/05/20 0401 05/06/20 0624 05/07/20 0106 05/08/20 0127 05/09/20 0447 05/10/20 1715 05/11/20 0316  WBC 2.4*   < > 4.6 2.7* 3.5* 6.9 5.6  HGB 11.2*   < > 10.4* 11.2* 11.6* 10.8* 10.6*  HCT 31.4*   < > 29.7* 31.9* 32.5* 31.2* 30.8*  PLT 101*   < > 96* 105* 134* 174 182  MCV 85.6   < > 86.3 83.9 82.7 86.7 86.3  MCH 30.5   < > 30.2 29.5 29.5 30.0 29.7  MCHC 35.7   < > 35.0 35.1 35.7 34.6 34.4  RDW 11.6   < > 11.5 11.3* 11.2* 11.7 11.6  LYMPHSABS 0.6*  --  1.0 1.3 1.2 1.4  --   MONOABS 0.3  --  0.4 0.3 0.4 0.6  --   EOSABS 0.0  --  0.0 0.1 0.0 0.0  --   BASOSABS 0.0  --  0.0 0.0 0.0 0.0  --    < > =  values in this interval not displayed.    Recent Labs  Lab 05/05/20 0401 05/06/20 0624 05/07/20 0106 05/08/20 0127 05/09/20 0447 05/10/20 1715 05/11/20 0316  NA 137   < > 139 137 137 136 139  K 3.3*   < > 3.9 3.8 3.4* 3.5 3.3*  CL 105   < > 109 105 104 106 108  CO2 26   < > 25 24  24  21* 23  GLUCOSE 106*   < > 101* 104* 103* 117* 90  BUN 18   < > 17 18 19  36* 36*  CREATININE 1.56*   < > 1.70* 1.49* 1.47* 2.53* 2.22*  CALCIUM 8.3*   < > 8.1* 8.6* 8.6* 8.5* 8.7*  AST  --   --  14* 16 15 18 19   ALT  --   --  <5 <5 6 <5 6  ALKPHOS  --   --  51 51 54 46 48  BILITOT  --   --  1.2 1.2 1.1 1.1 1.0  ALBUMIN  --   --  2.6* 2.8* 2.8* 2.8* 2.7*  MG 2.0  --  1.5* 1.5* 2.2  --   --   CRP  --   --  8.0* 9.6* 4.3*  --  11.5*  DDIMER  --   --  0.60* 0.76* 0.99*  --   --   HGBA1C 5.4  --   --   --   --   --   --   BNP  --   --  113.4* 116.8* 126.2*  --   --    < > = values in this interval not displayed.    ------------------------------------------------------------------------------------------------------------------ No results for input(s): CHOL, HDL, LDLCALC, TRIG, CHOLHDL, LDLDIRECT in the last 72 hours.  Lab Results  Component Value Date   HGBA1C 5.4 05/05/2020   ------------------------------------------------------------------------------------------------------------------ No results for input(s): TSH, T4TOTAL, T3FREE, THYROIDAB in the last 72 hours.  Invalid input(s): FREET3  Cardiac Enzymes No results for input(s): CKMB, TROPONINI, MYOGLOBIN in the last 168 hours.  Invalid input(s): CK ------------------------------------------------------------------------------------------------------------------    Component Value Date/Time   BNP 126.2 (H) 05/09/2020 0447    Micro Results Recent Results (from the past 240 hour(s))  Respiratory Panel by RT PCR (Flu A&B, Covid) - Nasopharyngeal Swab     Status: Abnormal   Collection Time: 05/03/20  1:13 PM   Specimen: Nasopharyngeal Swab  Result Value Ref Range Status   SARS Coronavirus 2 by RT PCR POSITIVE (A) NEGATIVE Final    Comment: RESULT CALLED TO, READ BACK BY AND VERIFIED WITH: FRANKLIN,C. RN @1510  05/03/20 BILLINGSLEY,L    Influenza A by PCR NEGATIVE NEGATIVE Final   Influenza B by PCR NEGATIVE NEGATIVE  Final    Comment: Performed at [facility], [address], [place], [place]  Culture, blood (single)     Status: None   Collection Time: 05/03/20  4:32 PM   Specimen: BLOOD RIGHT FOREARM  Result Value Ref Range Status   Specimen Description   Final    BLOOD RIGHT FOREARM Performed at [facility], [address] [place], [place]    Special Requests   Final    BOTTLES DRAWN AEROBIC AND ANAEROBIC Blood Culture adequate volume Performed at [facility], [address], [place], [place]    Culture   Final    NO GROWTH 5 DAYS Performed at [facility], [address] [place], [address]  [id number]    Report Status 05/08/2020 FINAL  Final  Respiratory Panel by RT PCR (Flu A&B, Covid) - Nasopharyngeal Swab     Status: Abnormal   Collection Time: 05/10/20  9:50 PM   Specimen: Nasopharyngeal Swab  Result Value Ref Range Status   SARS Coronavirus 2 by RT PCR POSITIVE (A) NEGATIVE Final    Comment: RESULT CALLED TO, READ BACK BY AND VERIFIED WITH: Kerrin Champagne RN 05/11/20 AT 0002 SK (NOTE) SARS-CoV-2 target nucleic acids are DETECTED.  SARS-CoV-2 RNA is generally detectable in upper respiratory specimens  during the acute phase of infection. Positive results are indicative of the presence of the identified virus, but do not rule out bacterial infection or co-infection with other pathogens not detected by the test. Clinical correlation with patient history and other diagnostic information is necessary to determine patient infection status. The expected result is Negative.  Fact Sheet for Patients:  https://www.moore.com/  Fact Sheet for Healthcare Providers: https://www.young.biz/  This test is not yet approved or cleared by the [place] FDA and  has been authorized for detection and/or diagnosis of SARS-CoV-2 by FDA under an Emergency Use Authorization  (EUA).  This EUA will remain in effect (meaning this test can be [place] ed) for the duration of  the COVID-19 declaration under Section 564(b)(1) of the Act, 21 U.S.C. section 360bbb-3(b)(1), unless the authorization is terminated or revoked sooner.      Influenza A by PCR NEGATIVE NEGATIVE Final   Influenza B by PCR NEGATIVE NEGATIVE Final    Comment: (NOTE) The Xpert Xpress SARS-CoV-2/FLU/RSV assay is intended as an aid in  the diagnosis of influenza from Nasopharyngeal swab specimens and  should not be used as a sole basis for treatment. Nasal washings and  aspirates are unacceptable for Xpert Xpress SARS-CoV-2/FLU/RSV  testing.  Fact Sheet for Patients: https://www.moore.com/  Fact Sheet for Healthcare Providers: https://www.young.biz/  This test is not yet approved or cleared by the [place] FDA and  has been authorized for detection and/or diagnosis of SARS-CoV-2 by  FDA under an Emergency Use Authorization (EUA). This EUA will remain  in effect (meaning this test can be used) for the duration of the  Covid-19 declaration under Section 564(b)(1) of the Act, 21  U.S.C. section 360bbb-3(b)(1), unless the authorization is  terminated or revoked. Performed at [facility], [address] [place], [place] [address]     Radiology Reports EEG  Result Date: 05/05/2020 Charlsie Quest, MD     05/05/2020  9:30 AM Patient Name: CRIS TALAVERA MRN: [id number] Epilepsy Attending: Charlsie Quest Referring Physician/Provider: Dr Albertine Grates Date: 05/04/2020 Duration: 23.26 minutes Patient history: 79 year old male with episodes of syncope x2.  EEG to evaluate for seizures. Level of alertness: Awake, drowsy, sleep, comatose, lethargic AEDs during EEG study: None Technical aspects: This EEG study was done with scalp electrodes positioned according to the 10-20 International system of electrode placement. Electrical activity was acquired at a  sampling rate of  and reviewed with a high frequency filter of  and a low frequency filter of . EEG data were recorded continuously and digitally stored. Description: The posterior dominant rhythm consists of 8 Hz activity of moderate voltage (25-35 uV) seen predominantly in posterior head regions, symmetric and reactive to eye opening and eye closing. EEG showed continuous generalized 5 to 6 Hz theta as well as intermittent 2 to 3 Hz delta slowing. Hyperventilation and photic stimulation were not performed.   ABNORMALITY -Continuous slow,  generalized IMPRESSION: This study is suggestive of mild diffuse encephalopathy, nonspecific etiology. No seizures or epileptiform discharges were seen throughout the recording. Charlsie Quest   DG Chest 2 View  Result Date: 05/03/2020 CLINICAL DATA:  79 year old male with a history of syncope EXAM: CHEST - 2 VIEW COMPARISON:  07/03/2019 FINDINGS: Cardiomediastinal silhouette likely unchanged in size and contour given the positioning. No pneumothorax. No pleural effusion. Coarsened interstitial markings bilateral lungs without confluent airspace disease. No acute displaced fracture. Degenerative changes of the visualized spine. IMPRESSION: Negative for acute cardiopulmonary disease Electronically Signed   By: Gilmer Mor D.O.   On: 05/03/2020 13:50   CT Head Wo Contrast  Result Date: 05/10/2020 CLINICAL DATA:  Mental status changes EXAM: CT HEAD WITHOUT CONTRAST TECHNIQUE: Contiguous axial images were obtained from the base of the skull through the vertex without intravenous contrast. COMPARISON:  05/03/2020 FINDINGS: Brain: There is atrophy and chronic small vessel disease changes. No acute intracranial abnormality. Specifically, no hemorrhage, hydrocephalus, mass lesion, acute infarction, or significant intracranial injury. Vascular: No hyperdense vessel or unexpected calcification. Skull: No acute calvarial abnormality. Sinuses/Orbits: Visualized  paranasal sinuses and mastoids clear. Orbital soft tissues unremarkable. Other: None IMPRESSION: Atrophy, chronic microvascular disease. No acute intracranial abnormality. Electronically Signed   By: Charlett Nose M.D.   On: 05/10/2020 19:26   CT Head Wo Contrast  Result Date: 05/03/2020 CLINICAL DATA:  Mental status change, unknown cause. EXAM: CT HEAD WITHOUT CONTRAST TECHNIQUE: Contiguous axial images were obtained from the base of the skull through the vertex without intravenous contrast. COMPARISON:  Head CT 02/16/2019.  Brain MRI 11/23/2018. FINDINGS: Brain: Moderate cerebral atrophy. Moderate ill-defined hypoattenuation within the cerebral white matter is nonspecific, but compatible with chronic small vessel ischemic disease. A small infarct is questioned within the posterior left cerebellar hemisphere, new from the prior head CT of 02/16/2019 but otherwise age indeterminate (series 2, image 9) (series 5, image 56). There is no acute intracranial hemorrhage. No demarcated cortical infarct. No extra-axial fluid collection. No evidence of intracranial mass. No midline shift. Vascular: No hyperdense vessel.  Atherosclerotic calcifications. Skull: Normal. Negative for fracture or focal lesion. Sinuses/Orbits: Visualized orbits show no acute finding. Mild ethmoid and maxillary sinus mucosal thickening. IMPRESSION: A small infarct is questioned within the posterior left cerebellar hemisphere, new from the prior head CT of 02/16/2019 but otherwise age indeterminate. Moderate cerebral atrophy and chronic small vessel ischemic disease. Mild ethmoid and maxillary sinus mucosal thickening. Electronically Signed   By: Jackey Loge DO   On: 05/03/2020 19:26   MR ANGIO HEAD WO CONTRAST  Result Date: 05/06/2020 CLINICAL DATA:  Posterior cerebellar infarction, age indeterminate. EXAM: MRI HEAD WITHOUT CONTRAST MRA HEAD WITHOUT CONTRAST MRA NECK WITHOUT CONTRAST TECHNIQUE: Multiplanar, multiecho pulse sequences of  the brain and surrounding structures were obtained without intravenous contrast. Angiographic images of the Circle of Willis were obtained using MRA technique without intravenous contrast. Angiographic images of the neck were obtained using MRA technique without intravenous contrast. Carotid stenosis measurements (when applicable) are obtained utilizing NASCET criteria, using the distal internal carotid diameter as the denominator. COMPARISON:  Head CT 05/03/2020 FINDINGS: MRI HEAD FINDINGS Brain: Diffusion imaging does not show any acute or subacute infarction. There are old small vessel infarctions within the left posterior cerebellum. Mild chronic small-vessel change affects the pons. Cerebral hemispheres show generalized atrophy, temporal lobe predominant. There are moderate chronic small-vessel ischemic changes of the deep white matter. No large vessel territory cerebral hemispheric infarction. No mass lesion,  hemorrhage, hydrocephalus or extra-axial collection. Vascular: Major vessels at the base of the brain show flow. Skull and upper cervical spine: Negative Sinuses/Orbits: Mild mucosal inflammatory changes of the sinuses. Orbits negative. Other: None MRA HEAD FINDINGS Both internal carotid arteries widely patent through the skull base and siphon regions. No siphon stenosis. The anterior and middle cerebral vessels are patent without proximal stenosis, aneurysm or vascular malformation. No large or medium vessel occlusion. Fenestrated left M1 segment. Both vertebral arteries are patent to the basilar. No basilar stenosis. Posterior circulation branch vessels appear normal. MRA NECK FINDINGS Limited resolution due to noncontrast technique. Both common carotid arteries show antegrade flow to the bifurcation regions. On the right, there is no apparent stenosis. On the left, there is too much artifact to evaluate the bifurcation region. Antegrade flow is present in both vertebral arteries. IMPRESSION: No acute  intracranial finding. 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 Moderate chronic small-vessel ischemic changes of the cerebral hemispheric deep white matter. Generalized atrophy, temporal lobe predominant. 1. MRA head: No large or medium vessel occlusion or correctable proximal stenosis. 2. MRA neck: Allowing for noncontrast technique, there is no visible carotid bifurcation stenosis on the right. Left carotid bifurcation cannot be seen because of artifact. Antegrade flow is present in both vertebral arteries. Electronically Signed   By: Paulina Fusi M.D.   On: 05/06/2020 13:56   MR ANGIO NECK WO CONTRAST  Result Date: 05/06/2020 CLINICAL DATA:  Posterior cerebellar infarction, age indeterminate. EXAM: MRI HEAD WITHOUT CONTRAST MRA HEAD WITHOUT CONTRAST MRA NECK WITHOUT CONTRAST TECHNIQUE: Multiplanar, multiecho pulse sequences of the brain and surrounding structures were obtained without intravenous contrast. Angiographic images of the Circle of Willis were obtained using MRA technique without intravenous contrast. Angiographic images of the neck were obtained using MRA technique without intravenous contrast. Carotid stenosis measurements (when applicable) are obtained utilizing NASCET criteria, using the distal internal carotid diameter as the denominator. COMPARISON:  Head CT 05/03/2020 FINDINGS: MRI HEAD FINDINGS Brain: Diffusion imaging does not show any acute or subacute infarction. There are old small vessel infarctions within the left posterior cerebellum. Mild chronic small-vessel change affects the pons. Cerebral hemispheres show generalized atrophy, temporal lobe predominant. There are moderate chronic small-vessel ischemic changes of the deep white matter. No large vessel territory cerebral hemispheric infarction. No mass lesion, hemorrhage, hydrocephalus or extra-axial collection. Vascular: Major vessels at the base of the brain show flow. Skull and upper cervical spine: Negative  Sinuses/Orbits: Mild mucosal inflammatory changes of the sinuses. Orbits negative. Other: None MRA HEAD FINDINGS Both internal carotid arteries widely patent through the skull base and siphon regions. No siphon stenosis. The anterior and middle cerebral vessels are patent without proximal stenosis, aneurysm or vascular malformation. No large or medium vessel occlusion. Fenestrated left M1 segment. Both vertebral arteries are patent to the basilar. No basilar stenosis. Posterior circulation branch vessels appear normal. MRA NECK FINDINGS Limited resolution due to noncontrast technique. Both common carotid arteries show antegrade flow to the bifurcation regions. On the right, there is no apparent stenosis. On the left, there is too much artifact to evaluate the bifurcation region. Antegrade flow is present in both vertebral arteries. IMPRESSION: No acute intracranial finding. 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 Moderate chronic small-vessel ischemic changes of the cerebral hemispheric deep white matter. Generalized atrophy, temporal lobe predominant. 1. MRA head: No large or medium vessel occlusion or correctable proximal stenosis. 2. MRA neck: Allowing for noncontrast technique, there is no visible carotid bifurcation  stenosis on the right. Left carotid bifurcation cannot be seen because of artifact. Antegrade flow is present in both vertebral arteries. Electronically Signed   By: Paulina Fusi M.D.   On: 05/06/2020 13:56   MR BRAIN WO CONTRAST  Result Date: 05/06/2020 CLINICAL DATA:  Posterior cerebellar infarction, age indeterminate. EXAM: MRI HEAD WITHOUT CONTRAST MRA HEAD WITHOUT CONTRAST MRA NECK WITHOUT CONTRAST TECHNIQUE: Multiplanar, multiecho pulse sequences of the brain and surrounding structures were obtained without intravenous contrast. Angiographic images of the Circle of Willis were obtained using MRA technique without intravenous contrast. Angiographic images of the neck were  obtained using MRA technique without intravenous contrast. Carotid stenosis measurements (when applicable) are obtained utilizing NASCET criteria, using the distal internal carotid diameter as the denominator. COMPARISON:  Head CT 05/03/2020 FINDINGS: MRI HEAD FINDINGS Brain: Diffusion imaging does not show any acute or subacute infarction. There are old small vessel infarctions within the left posterior cerebellum. Mild chronic small-vessel change affects the pons. Cerebral hemispheres show generalized atrophy, temporal lobe predominant. There are moderate chronic small-vessel ischemic changes of the deep white matter. No large vessel territory cerebral hemispheric infarction. No mass lesion, hemorrhage, hydrocephalus or extra-axial collection. Vascular: Major vessels at the base of the brain show flow. Skull and upper cervical spine: Negative Sinuses/Orbits: Mild mucosal inflammatory changes of the sinuses. Orbits negative. Other: None MRA HEAD FINDINGS Both internal carotid arteries widely patent through the skull base and siphon regions. No siphon stenosis. The anterior and middle cerebral vessels are patent without proximal stenosis, aneurysm or vascular malformation. No large or medium vessel occlusion. Fenestrated left M1 segment. Both vertebral arteries are patent to the basilar. No basilar stenosis. Posterior circulation branch vessels appear normal. MRA NECK FINDINGS Limited resolution due to noncontrast technique. Both common carotid arteries show antegrade flow to the bifurcation regions. On the right, there is no apparent stenosis. On the left, there is too much artifact to evaluate the bifurcation region. Antegrade flow is present in both vertebral arteries. IMPRESSION: No acute intracranial finding. 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 Moderate chronic small-vessel ischemic changes of the cerebral hemispheric deep white matter. Generalized atrophy, temporal lobe predominant. 1. MRA  head: No large or medium vessel occlusion or correctable proximal stenosis. 2. MRA neck: Allowing for noncontrast technique, there is no visible carotid bifurcation stenosis on the right. Left carotid bifurcation cannot be seen because of artifact. Antegrade flow is present in both vertebral arteries. Electronically Signed   By: Paulina Fusi M.D.   On: 05/06/2020 13:56   DG CHEST PORT 1 VIEW  Result Date: 05/11/2020 CLINICAL DATA:  Altered mental status. EXAM: PORTABLE CHEST 1 VIEW COMPARISON:  05/03/2020. FINDINGS: Cardiomegaly. No pulmonary venous congestion. Low lung volumes. No focal infiltrate. No pleural effusion or pneumothorax. Degenerative change thoracic spine. IMPRESSION: 1. Cardiomegaly. No pulmonary venous congestion. 2. Low lung volumes. No focal infiltrate. Electronically Signed   By: Maisie Fus  Register   On: 05/11/2020 07:30   VAS US CAROTID  Result Date: 05/06/2020 Carotid Arterial Duplex Study Indications:       CVA. Risk Factors:      Hypertension. Limitations        Today's exam was limited due to the patient's respiratory                    variation. Comparison Study:  No prior studies. Performing Technologist: Chanda Busing RVT  Examination Guidelines: A complete evaluation includes B-mode imaging, spectral Doppler, color Doppler, and power Doppler  as needed of all accessible portions of each vessel. Bilateral testing is considered an integral part of a complete examination. Limited examinations for reoccurring indications may be performed as noted.  Right Carotid Findings: +----------+--------+--------+--------+-----------------------+--------+           PSV cm/sEDV cm/sStenosisPlaque Description     Comments +----------+--------+--------+--------+-----------------------+--------+ CCA Prox  111     14              smooth and heterogenous         +----------+--------+--------+--------+-----------------------+--------+ CCA Distal78      14              smooth and  heterogenous         +----------+--------+--------+--------+-----------------------+--------+ ICA Prox  97      16              smooth and heterogenous         +----------+--------+--------+--------+-----------------------+--------+ ICA Distal51      14                                     tortuous +----------+--------+--------+--------+-----------------------+--------+ ECA       224     26                                              +----------+--------+--------+--------+-----------------------+--------+ +----------+--------+-------+--------+-------------------+           PSV cm/sEDV cmsDescribeArm Pressure (mmHG) +----------+--------+-------+--------+-------------------+ [id number]                                        +----------+--------+-------+--------+-------------------+ +---------+--------+--+--------+--+---------+ VertebralPSV cm/s59EDV cm/s12Antegrade +---------+--------+--+--------+--+---------+  Left Carotid Findings: +----------+--------+--------+--------+-----------------------+--------+           PSV cm/sEDV cm/sStenosisPlaque Description     Comments +----------+--------+--------+--------+-----------------------+--------+ CCA Prox  107     14              smooth and heterogenous         +----------+--------+--------+--------+-----------------------+--------+ CCA Distal111     14              smooth and heterogenous         +----------+--------+--------+--------+-----------------------+--------+ ICA Prox  133     21              smooth and heterogenous         +----------+--------+--------+--------+-----------------------+--------+ ICA Distal88      24                                     tortuous +----------+--------+--------+--------+-----------------------+--------+ ECA       107     9                                               +----------+--------+--------+--------+-----------------------+--------+  +----------+--------+--------+--------+-------------------+           PSV cm/sEDV cm/sDescribeArm Pressure (mmHG) +----------+--------+--------+--------+-------------------+ Subclavian200                                         +----------+--------+--------+--------+-------------------+ +---------+--------+--+--------+--+---------+  VertebralPSV cm/s57EDV cm/s12Antegrade +---------+--------+--+--------+--+---------+   Summary: Right Carotid: Velocities in the right ICA are consistent with a 1-39% stenosis. Left Carotid: Velocities in the left ICA are consistent with a 1-39% stenosis. Vertebrals: Bilateral vertebral arteries demonstrate antegrade flow. *See table(s) above for measurements and observations.  Electronically signed by Fabienne Bruns MD on 05/06/2020 at 11:49:43 AM.    Final    ECHOCARDIOGRAM LIMITED  Result Date: 05/04/2020    ECHOCARDIOGRAM LIMITED REPORT   Patient Name:   CHEYNE BOULDEN Date of Exam: 05/04/2020 Medical Rec #:  [id number]      Height:       68.0 in Accession #:    [id number]     Weight:       170.9 lb Date of Birth:  1941-05-09       BSA:          1.911 m Patient Age:    79 years       BP:           180/84 mmHg Patient Gender: M              HR:           70 bpm. Exam Location:  Inpatient Procedure: Limited Echo, Cardiac Doppler and Color Doppler Indications:    Syncope  History:        Patient has no prior history of Echocardiogram examinations.                 COPD, Signs/Symptoms:Syncope; Risk Factors:Hypertension,                 Dyslipidemia and Former Smoker. Parkinsons Dementia, COVID+.  Sonographer:    Lavenia Atlas Referring Phys: Albertine Grates  Sonographer Comments: Technically challenging study due to limited acoustic windows. Image acquisition challenging due to COPD. IMPRESSIONS  1. Left ventricular ejection fraction, by estimation, is 55 to 60%. The left ventricle has normal function. There is mild left ventricular hypertrophy. Left ventricular  diastolic parameters are consistent with Grade I diastolic dysfunction (impaired relaxation).  2. Right ventricular systolic function is normal. The right ventricular size is normal. Tricuspid regurgitation signal is inadequate for assessing PA pressure.  3. Left atrial size was mildly dilated.  4. The mitral valve is normal in structure. No evidence of mitral valve regurgitation. No evidence of mitral stenosis.  5. The aortic valve was not well visualized. Aortic valve regurgitation is trivial. No aortic stenosis is present.  6. Technically difficult study with poor acoustic windows. FINDINGS  Left Ventricle: Left ventricular ejection fraction, by estimation, is 55 to 60%. The left ventricle has normal function. The left ventricular internal cavity size was normal in size. There is mild left ventricular hypertrophy. Left ventricular diastolic  parameters are consistent with Grade I diastolic dysfunction (impaired relaxation). Right Ventricle: The right ventricular size is normal. No increase in right ventricular wall thickness. Right ventricular systolic function is normal. Tricuspid regurgitation signal is inadequate for assessing PA pressure. Left Atrium: Left atrial size was mildly dilated. Right Atrium: Right atrial size was normal in size. Mitral Valve: The mitral valve is normal in structure. No evidence of mitral valve stenosis. Tricuspid Valve: The tricuspid valve is normal in structure. Tricuspid valve regurgitation is not demonstrated. Aortic Valve: The aortic valve was not well visualized. Aortic valve regurgitation is trivial. No aortic stenosis is present. Pulmonic Valve: The pulmonic valve was not well visualized. Aorta: The aortic root is normal in size and structure. Venous: The inferior vena cava  was not well visualized. IAS/Shunts: No atrial level shunt detected by color flow Doppler. LEFT VENTRICLE PLAX 2D LVIDd:         3.70 cm Diastology LVIDs:         2.60 cm LV e' medial:    4.79 cm/s LV PW:          1.40 cm LV E/e' medial:  9.3 LV IVS:        1.20 cm LV e' lateral:   5.77 cm/s                        LV E/e' lateral: 7.7  RIGHT VENTRICLE RV S prime:     7.07 cm/s MITRAL VALVE MV Area (PHT): 2.22 cm MV Decel Time: 342 msec MV E velocity: 44.60 cm/s MV A velocity: 62.60 cm/s MV E/A ratio:  0.71 Marca Anconaalton Mclean MD Electronically signed by Marca Anconaalton Mclean MD Signature Date/Time: 05/04/2020/3:45:20 PM    Final

## 2020-05-12 ENCOUNTER — Other Ambulatory Visit: Payer: Self-pay

## 2020-05-12 ENCOUNTER — Encounter (HOSPITAL_COMMUNITY): Payer: Self-pay | Admitting: Internal Medicine

## 2020-05-12 DIAGNOSIS — R4182 Altered mental status, unspecified: Secondary | ICD-10-CM | POA: Diagnosis not present

## 2020-05-12 DIAGNOSIS — G9340 Encephalopathy, unspecified: Secondary | ICD-10-CM

## 2020-05-12 DIAGNOSIS — I10 Essential (primary) hypertension: Secondary | ICD-10-CM | POA: Diagnosis not present

## 2020-05-12 LAB — CBC
HCT: 29.3 % — ABNORMAL LOW (ref 39.0–52.0)
Hemoglobin: 10.2 g/dL — ABNORMAL LOW (ref 13.0–17.0)
MCH: 29.9 pg (ref 26.0–34.0)
MCHC: 34.8 g/dL (ref 30.0–36.0)
MCV: 85.9 fL (ref 80.0–100.0)
Platelets: 229 10*3/uL (ref 150–400)
RBC: 3.41 MIL/uL — ABNORMAL LOW (ref 4.22–5.81)
RDW: 11.5 % (ref 11.5–15.5)
WBC: 5.3 10*3/uL (ref 4.0–10.5)
nRBC: 0 % (ref 0.0–0.2)

## 2020-05-12 LAB — GLUCOSE, CAPILLARY
Glucose-Capillary: 121 mg/dL — ABNORMAL HIGH (ref 70–99)
Glucose-Capillary: 127 mg/dL — ABNORMAL HIGH (ref 70–99)
Glucose-Capillary: 135 mg/dL — ABNORMAL HIGH (ref 70–99)

## 2020-05-12 LAB — COMPREHENSIVE METABOLIC PANEL
ALT: 11 U/L (ref 0–44)
AST: 17 U/L (ref 15–41)
Albumin: 2.6 g/dL — ABNORMAL LOW (ref 3.5–5.0)
Alkaline Phosphatase: 46 U/L (ref 38–126)
Anion gap: 9 (ref 5–15)
BUN: 33 mg/dL — ABNORMAL HIGH (ref 8–23)
CO2: 20 mmol/L — ABNORMAL LOW (ref 22–32)
Calcium: 8.6 mg/dL — ABNORMAL LOW (ref 8.9–10.3)
Chloride: 114 mmol/L — ABNORMAL HIGH (ref 98–111)
Creatinine, Ser: 1.73 mg/dL — ABNORMAL HIGH (ref 0.61–1.24)
GFR, Estimated: 39 mL/min — ABNORMAL LOW (ref >60)
Glucose, Bld: 98 mg/dL (ref 70–99)
Potassium: 3.4 mmol/L — ABNORMAL LOW (ref 3.5–5.1)
Sodium: 143 mmol/L (ref 135–145)
Total Bilirubin: 1.1 mg/dL (ref 0.3–1.2)
Total Protein: 5.4 g/dL — ABNORMAL LOW (ref 6.5–8.1)

## 2020-05-12 LAB — MAGNESIUM: Magnesium: 1.9 mg/dL (ref 1.7–2.4)

## 2020-05-12 LAB — TSH: TSH: 1.824 u[IU]/mL (ref 0.350–4.500)

## 2020-05-12 LAB — PHOSPHORUS: Phosphorus: 2.5 mg/dL (ref 2.5–4.6)

## 2020-05-12 LAB — AMMONIA: Ammonia: 19 umol/L (ref 9–35)

## 2020-05-12 MED ORDER — CARVEDILOL 6.25 MG PO TABS
6.2500 mg | ORAL_TABLET | Freq: Two times a day (BID) | ORAL | Status: DC
  Administered 2020-05-12 – 2020-05-30 (×36): 6.25 mg
  Filled 2020-05-12 (×36): qty 1

## 2020-05-12 MED ORDER — FREE WATER
155.0000 mL | Status: DC
  Administered 2020-05-12 – 2020-05-28 (×95): 155 mL

## 2020-05-12 MED ORDER — HYDRALAZINE HCL 50 MG PO TABS
100.0000 mg | ORAL_TABLET | Freq: Three times a day (TID) | ORAL | Status: DC
  Administered 2020-05-12 – 2020-05-13 (×3): 100 mg
  Filled 2020-05-12 (×3): qty 2

## 2020-05-12 MED ORDER — RIVASTIGMINE TARTRATE 1.5 MG PO CAPS
1.5000 mg | ORAL_CAPSULE | Freq: Two times a day (BID) | ORAL | Status: DC
  Administered 2020-05-12 – 2020-05-21 (×18): 1.5 mg
  Filled 2020-05-12 (×18): qty 1

## 2020-05-12 MED ORDER — CARBIDOPA-LEVODOPA 25-100 MG PO TABS
1.0000 | ORAL_TABLET | Freq: Three times a day (TID) | ORAL | Status: DC
  Administered 2020-05-12 – 2020-05-30 (×53): 1
  Filled 2020-05-12 (×57): qty 1

## 2020-05-12 MED ORDER — ASPIRIN 81 MG PO CHEW
81.0000 mg | CHEWABLE_TABLET | Freq: Every day | ORAL | Status: DC
  Administered 2020-05-13 – 2020-05-30 (×18): 81 mg
  Filled 2020-05-12 (×18): qty 1

## 2020-05-12 MED ORDER — VITAMIN B-12 1000 MCG PO TABS
1000.0000 ug | ORAL_TABLET | Freq: Every day | ORAL | Status: DC
  Administered 2020-05-13 – 2020-05-31 (×19): 1000 ug
  Filled 2020-05-12 (×19): qty 1

## 2020-05-12 MED ORDER — ENOXAPARIN SODIUM 40 MG/0.4ML ~~LOC~~ SOLN
40.0000 mg | Freq: Every day | SUBCUTANEOUS | Status: DC
  Administered 2020-05-13 – 2020-05-31 (×19): 40 mg via SUBCUTANEOUS
  Filled 2020-05-12 (×19): qty 0.4

## 2020-05-12 MED ORDER — VITAL HIGH PROTEIN PO LIQD: 1000.0000 mL | ORAL | Status: DC

## 2020-05-12 MED ORDER — PROSOURCE TF PO LIQD
45.0000 mL | Freq: Two times a day (BID) | ORAL | Status: DC
  Administered 2020-05-12 – 2020-05-17 (×11): 45 mL
  Filled 2020-05-12 (×13): qty 45

## 2020-05-12 MED ORDER — OSMOLITE 1.5 CAL PO LIQD
1000.0000 mL | ORAL | Status: DC
  Administered 2020-05-12 – 2020-05-17 (×7): 1000 mL
  Filled 2020-05-12 (×10): qty 1000

## 2020-05-12 NOTE — Evaluation (Signed)
Clinical/Bedside Swallow Evaluation Patient Details  Name: [name] MRN: [id number] Date of Birth: 03/05/1941  Today's Date: 05/12/2020 Time: SLP Start Time (ACUTE ONLY): 1440 SLP Stop Time (ACUTE ONLY): 1453 SLP Time Calculation (min) (ACUTE ONLY): 13 min  Past Medical History:  Past Medical History:  Diagnosis Date  . B12 deficiency 11/19/2018  . Benign prostatic hypertrophy   . Edema   . Hyperlipidemia   . Hypertension   . Insomnia   . Parasomnia 10/14/2011   PSG 12/15/11>>AHI 0.4, SpO2 low 89%, PLMI 16, somnoliloquay, nocturia   . Prediabetes 06/10/2012  . Seasonal allergies   . Sepsis (HCC) 01/2019   Past Surgical History:  Past Surgical History:  Procedure Laterality Date  . CYSTOSCOPY/URETEROSCOPY/HOLMIUM LASER/STENT PLACEMENT Left 07/05/2019   Procedure: CYSTOSCOPY/URETEROSCOPY/STENT PLACEMENT/REROGRADE;  Surgeon: Crista Elliot, MD;  Location: [facility];  Service: Urology;  Laterality: Left;  . FINGER SURGERY Right 04/19/2014   RIGHT INDEX & RING FINGER   FROM DOG BITE   . I & D EXTREMITY Right 04/20/2014   Procedure: IRRIGATION AND DEBRIDEMENT right index finger and right ring finger;  Surgeon: Dominica Severin, MD;  Location: [facility] OR;  Service: Orthopedics;  Laterality: Right;  . I & D EXTREMITY Right 04/21/2014   Procedure: IRRIGATION AND DEBRIDEMENT RIGHT HAND RING FINGER, INDEX FINGER AND Flexor Tenolysis and FDS Tenotomy;  Surgeon: Dominica Severin, MD;  Location: [facility] OR;  Service: Orthopedics;  Laterality: Right;   HPI:  79 y.o. male with a history of Parkinson's disease, dementia, increasing falls at home, HTN, covid-19 vaccinations, and BPH who presented to the ED from home after 2 episodes of loss of consciousness. He has had altered mental status since arrival, so history is limited.age-indeterminate infarct of the posterior left cerebellum was noted   Assessment / Plan / Recommendation Clinical Impression  [name] answered questions, speech mostly  unintelligible. Minimal command following for oral-motor exam however no overt weakness noted. Given level of lethargy, dentures were not donned for assessment. He exhibited decreased cohesion, bolus formation and suspected late onset of swallow. Trials of thin, nectar and pudding were unremarkable for indications of airway compromise. He should remain NPO until alertness improves- has Cortrak. Continue oral care and therapist will return for diagnostic treatment and recommendations.      SLP Visit Diagnosis: Dysphagia, unspecified (R13.10)    Aspiration Risk  Mild aspiration risk;Moderate aspiration risk    Diet Recommendation NPO   Medication Administration: Via alternative means    Other  Recommendations Oral Care Recommendations: Oral care QID   Follow up Recommendations Other (comment) (TBA)      Frequency and Duration min 2x/week  2 weeks       Prognosis Prognosis for Safe Diet Advancement: Good      Swallow Study   General Date of Onset: 05/10/20 HPI: 79 y.o. male with a history of Parkinson's disease, dementia, increasing falls at home, HTN, covid-19 vaccinations, and BPH who presented to the ED from home after 2 episodes of loss of consciousness. He has had altered mental status since arrival, so history is limited.age-indeterminate infarct of the posterior left cerebellum was noted Type of Study: Bedside Swallow Evaluation Previous Swallow Assessment:  (see HPI) Diet Prior to this Study: NPO Temperature Spikes Noted: No Respiratory Status: Room air History of Recent Intubation: No Behavior/Cognition: Lethargic/Drowsy;Requires cueing Oral Cavity Assessment: Dry Oral Care Completed by SLP: Recent completion by staff Oral Cavity - Dentition: Dentures, bottom;Dentures, top;Edentulous Self-Feeding Abilities: Needs assist Patient Positioning: Upright  in bed Baseline Vocal Quality: Normal Volitional Cough: Cognitively unable to elicit Volitional Swallow: Unable to elicit     Oral/Motor/Sensory Function Overall Oral Motor/Sensory Function: Other (comment) (TBA)   Ice Chips Ice chips: Not tested   Thin Liquid Thin Liquid: Impaired Presentation: Cup Oral Phase Impairments: Reduced labial seal;Poor awareness of bolus Oral Phase Functional Implications: Right anterior spillage;Left anterior spillage Pharyngeal  Phase Impairments: Multiple swallows;Suspected delayed Swallow    Nectar Thick Nectar Thick Liquid: Impaired Pharyngeal Phase Impairments: Suspected delayed Swallow   Honey Thick Honey Thick Liquid: Not tested   Puree Puree: Impaired Pharyngeal Phase Impairments: Suspected delayed Swallow   Solid     Solid: Not tested      [name] 05/12/2020,3:25 PM  [name] [name].Ed Nurse, children's [phone] Office [phone]

## 2020-05-12 NOTE — Progress Notes (Signed)
Patient has what appears to exudate coming from his right eye. While checking the eye, the conjunctiva appears red.

## 2020-05-12 NOTE — Progress Notes (Signed)
   05/12/20 0835  Assess: MEWS Score  Level of Consciousness Responds to Pain  Assess: MEWS Score  MEWS Temp 0  MEWS Systolic 0  MEWS Pulse 0  MEWS RR 0  MEWS LOC 2  MEWS Score 2  MEWS Score Color Yellow  Assess: if the MEWS score is Yellow or Red  Were vital signs taken at a resting state? Yes  Focused Assessment No change from prior assessment  Early Detection of Sepsis Score *See Row Information* Low  MEWS guidelines implemented *See Row Information* Yes  Treat  MEWS Interventions Escalated (See documentation below)  Pain Scale PAINAD  Pain Score 0  Faces Pain Scale 0  Notify: Charge Nurse/RN  Name of Charge Nurse/RN Notified Sarah RN  Date Charge Nurse/RN Notified 05/12/20  Time Charge Nurse/RN Notified [id number]

## 2020-05-12 NOTE — Consult Note (Addendum)
Neurology Consultation  CC: Patient states he is doing OK. Neurology is consulted for encephalopathy.   History is obtained from:chart and previous dealings with patient on last hospital visit.   HPI: COLBEY WIRTANEN is an 79 y.o. male with a PMHx of Parkinson's disease, dementia, HTN, neutropenia, thrombocytopenia, CKD and BPH. Neurology was consulted during his 11/10-11/16 hospital stay due to a finding of age indeterminate infarction of the cerebellum, later determined to be old. The patient also sufferred from syncope which was thought to be from an autonomic componenet of his Parkinson's disease. The patient received Haldol sedation one day and became unresponsive. After the sedation wore off, neurology saw pt on the 11/15 and felt him to be back to his baseline mentation given his PD and dementia, and this was verified with his family. Neurology signed off then. Pt was discharged to a facility the next day. Per wife, pt was his "normal self" on 11/16 by phone contact. However, she went to see patient in person on the17th and stated the patient would not talk or respond. Wife stated pt had been unresponsive since admission to SNF, but this was difficult to confirm.   Pt was re-admitted 11/18 with acute encephalopathy in the setting of PD and dementia. On admission, he would only grunt or say "yea".  He was afebrile without signs of infection with normal UA and stable CT head. Neuro was asked again to consult due to encephalopathy.   This NP originally saw pt on 11/15 at end of last hospital stay. At that time, pt kept his eyes closed even though he was interacting and talking. He could follow some commands, but was disoriented except to his name and city. His speech was fluent but he was non compliant with some of the exam or lacked the comprehension to follow some directions.   ROS: Pt chooses questions to give answers to and ignores others. ROS is negative except as per HPI.   Past Medical  History:  Diagnosis Date  . B12 deficiency 11/19/2018  . Benign prostatic hypertrophy   . Edema   . Hyperlipidemia   . Hypertension   . Insomnia   . Parasomnia 10/14/2011   PSG 12/15/11>>AHI 0.4, SpO2 low 89%, PLMI 16, somnoliloquay, nocturia   . Prediabetes 06/10/2012  . Seasonal allergies   . Sepsis (HCC) 01/2019     Family History  Problem Relation Age of Onset  . Diabetes Mother   . Tremor Father   . Diabetes Other      Social History:  reports that he has quit smoking. His smoking use included cigarettes and cigars. He has a 7.00 pack-year smoking history. He has never used smokeless tobacco. He reports previous alcohol use. He reports that he does not use drugs.  Prior to Admission medications   Medication Sig Start Date End Date Taking? Authorizing Provider  aspirin 81 MG chewable tablet Chew 1 tablet (81 mg total) by mouth daily. 05/09/20  Yes Leroy Sea, MD  carbidopa-levodopa (SINEMET IR) 25-100 MG tablet Take 1 tablet three times a day with meals Patient taking differently: Take 1 tablet by mouth 3 (three) times daily.  10/19/19  Yes Van Clines, MD  carvedilol (COREG) 6.25 MG tablet Take 1 tablet (6.25 mg total) by mouth 2 (two) times daily with a meal. 05/09/20  Yes Leroy Sea, MD  Cholecalciferol (VITAMIN D) 1000 UNITS capsule Take 1 capsule (1,000 Units total) by mouth daily. 09/09/12  Yes Plotnikov, Georgina Quint, MD  cyanocobalamin (,VITAMIN B-12,) 1000 MCG/ML injection 1 ml sq for 5 days, then 1 ml weekly for 4 weeks, then 1 ml every 2 weeks Patient taking differently: Inject 1,000 mcg into the muscle every 14 (fourteen) days.  11/19/18  Yes Plotnikov, Georgina Quint, MD  hydrALAZINE (APRESOLINE) 100 MG tablet Take 1 tablet (100 mg total) by mouth every 8 (eight) hours. 05/09/20  Yes Leroy Sea, MD  loratadine (CLARITIN) 10 MG tablet Take 10 mg by mouth daily as needed for allergies.   Yes [provider]  rivastigmine (EXELON) 1.5 MG  capsule Take 1 capsule (1.5 mg total) by mouth 2 (two) times daily. 10/19/19  Yes Van Clines, MD  rosuvastatin (CRESTOR) 10 MG tablet Take 1 tablet (10 mg total) by mouth daily. 05/09/20  Yes Leroy Sea, MD  traZODone (DESYREL) 50 MG tablet TAKE 2 TABLETS BY MOUTH AT BEDTIME Patient taking differently: Take 100 mg by mouth at bedtime.  10/19/19  Yes Van Clines, MD  valACYclovir (VALTREX) 500 MG tablet Take 500 mg by mouth in the morning and at bedtime. 05/09/20  Yes [provider]  vitamin B-12 (CYANOCOBALAMIN) 1000 MCG tablet Take 1,000 mcg by mouth daily.   Yes [provider]  SYRINGE-NEEDLE, DISP, 3 ML (BD ECLIPSE SYRINGE) 25G X 1" 3 ML MISC Use sq as directed 11/19/18   Plotnikov, Georgina Quint, MD  torsemide (DEMADEX) 100 MG tablet Take 0.5-1 tablets (50-100 mg total) by mouth daily as needed. Patient not taking: Reported on 05/10/2020 04/13/20   Plotnikov, Georgina Quint, MD  valACYclovir (VALTREX) 1000 MG tablet Take 1 tablet (1,000 mg total) by mouth 3 (three) times daily. Patient not taking: Reported on 05/10/2020 11/17/19   Plotnikov, Georgina Quint, MD     Exam: Current vital signs: BP (!) 191/89 (BP Location: Left Arm)   Pulse 72   Temp 98.2 F (36.8 C) (Axillary)   Resp 16   Ht 5\' 8"  (1.727 m)   Wt 72.7 kg   SpO2 98%   BMI 24.37 kg/m    Physical Exam  Constitutional: Appears well-developed and well-nourished.  Psych: confused but mostly cooperative and pleasant.  Eyes: No scleral injection HENT: No OP obstrucion Head: Normocephalic.  Cardiovascular: Normal rate and regular rhythm.  Respiratory: Effort normal, non-labored breathing GI: Soft.  No distension. There is no tenderness.  Skin: WDI  Neuro: Mental Status: Patient is awake (with eyes closed). Oriented to his name. Disoriented to place, month, day, date and year. States "I don't know about all that now".  Patient is unable to give a clear and coherent history due to mental status.  No  signs of aphasia or neglect Cranial Nerves: II: PERRL 61mm. III,IV, VI: Will not follow instructions for exam.  V: Facial sensation is symmetric to temperature and touch VII: Smile is symmetrical.  VIII: hearing is intact to voice IX, X: Uvula elevates symmetrically XI: Shoulder shrug is symmetric and 5/5.  XII: tongue is midline without atrophy or fasciculations.  Motor: Tone is increased with a component of mild rigidity in all 4 extremitiesl. Bulk is normal. 5/5 strength was present in all four extremities. Cogwheeling noted in UEs.  Sensory: Sensation is symmetric to light touch and temperature in the arms and legs. Deep Tendon Reflexes: 2+ and symmetric in the biceps, brachioradialis, and patellae.  Plantars: Toes are downgoing bilaterally.  Cerebellar: Will not follow directions for testing.  Gait: Deferred.    I have reviewed labs in epic and the  pertinent results are:  Lab Results  Component Value Date/Time   CHOL 139 05/05/2020 04:01 AM    Results for orders placed or performed during the hospital encounter of 05/10/20 (from the past 48 hour(s))  CBC with Differential/Platelet     Status: Abnormal   Collection Time: 05/10/20  5:15 PM  Result Value Ref Range   WBC 6.9 4.0 - 10.5 K/uL   RBC 3.60 (L) 4.22 - 5.81 MIL/uL   Hemoglobin 10.8 (L) 13.0 - 17.0 g/dL   HCT 62.1 (L) 39 - 52 %   MCV 86.7 80.0 - 100.0 fL   MCH 30.0 26.0 - 34.0 pg   MCHC 34.6 30.0 - 36.0 g/dL   RDW 30.8 65.7 - 84.6 %   Platelets 174 150 - 400 K/uL   nRBC 0.0 0.0 - 0.2 %   Neutrophils Relative % 71 %   Neutro Abs 4.9 1.7 - 7.7 K/uL   Lymphocytes Relative 20 %   Lymphs Abs 1.4 0.7 - 4.0 K/uL   Monocytes Relative 9 %   Monocytes Absolute 0.6 0.1 - 1.0 K/uL   Eosinophils Relative 0 %   Eosinophils Absolute 0.0 0.0 - 0.5 K/uL   Basophils Relative 0 %   Basophils Absolute 0.0 0.0 - 0.1 K/uL   Immature Granulocytes 0 %   Abs Immature Granulocytes 0.01 0.00 - 0.07 K/uL    Comment: Performed at  [facility], [address], [place], [place] [address]  Comprehensive metabolic panel     Status: Abnormal   Collection Time: 05/10/20  5:15 PM  Result Value Ref Range   Sodium 136 135 - 145 mmol/L   Potassium 3.5 3.5 - 5.1 mmol/L   Chloride 106 98 - 111 mmol/L   CO2 21 (L) 22 - 32 mmol/L   Glucose, Bld 117 (H) 70 - 99 mg/dL    Comment: Glucose reference range applies only to samples taken after fasting for at least 8 hours.   BUN 36 (H) 8 - 23 mg/dL   Creatinine, Ser 2.84 (H) 0.61 - 1.24 mg/dL   Calcium 8.5 (L) 8.9 - 10.3 mg/dL   Total Protein 5.7 (L) 6.5 - 8.1 g/dL   Albumin 2.8 (L) 3.5 - 5.0 g/dL   AST 18 15 - 41 U/L   ALT <5 0 - 44 U/L   Alkaline Phosphatase 46 38 - 126 U/L   Total Bilirubin 1.1 0.3 - 1.2 mg/dL   GFR, Estimated 25 (L) >60 mL/min    Comment: (NOTE) Calculated using the CKD-EPI Creatinine Equation (2021)    Anion gap 9 5 - 15    Comment: Performed at [facility], [address] [place], [place] [address]  CK     Status: None   Collection Time: 05/10/20  5:26 PM  Result Value Ref Range   Total CK 261 49.0 - 397.0 U/L    Comment: Performed at [facility], [address] [place], [place] [address]  Respiratory Panel by RT PCR (Flu A&B, Covid) - Nasopharyngeal Swab     Status: Abnormal   Collection Time: 05/10/20  9:50 PM   Specimen: Nasopharyngeal Swab  Result Value Ref Range   SARS Coronavirus 2 by RT PCR POSITIVE (A) NEGATIVE    Comment: RESULT CALLED TO, READ BACK BY AND VERIFIED WITH: Kerrin Champagne RN 05/11/20 AT 0002 SK (NOTE) SARS-CoV-2 target nucleic acids are DETECTED.  SARS-CoV-2 RNA is generally detectable in upper respiratory specimens  during the acute  phase of infection. Positive results are indicative of the presence of the identified virus, but do not rule out bacterial infection or co-infection with other pathogens not detected by the test. Clinical correlation with patient history and other diagnostic information is  necessary to determine patient infection status. The expected result is Negative.  Fact Sheet for Patients:  https://www.moore.com/  Fact Sheet for Healthcare Providers: https://www.young.biz/  This test is not yet approved or cleared by the [place] FDA and  has been authorized for detection and/or diagnosis of SARS-CoV-2 by FDA under an Emergency Use Authorization (EUA).  This EUA will remain in effect (meaning this test can be [place] ed) for the duration of  the COVID-19 declaration under Section 564(b)(1) of the Act, 21 U.S.C. section 360bbb-3(b)(1), unless the authorization is terminated or revoked sooner.      Influenza A by PCR NEGATIVE NEGATIVE   Influenza B by PCR NEGATIVE NEGATIVE    Comment: (NOTE) The Xpert Xpress SARS-CoV-2/FLU/RSV assay is intended as an aid in  the diagnosis of influenza from Nasopharyngeal swab specimens and  should not be used as a sole basis for treatment. Nasal washings and  aspirates are unacceptable for Xpert Xpress SARS-CoV-2/FLU/RSV  testing.  Fact Sheet for Patients: https://www.moore.com/  Fact Sheet for Healthcare Providers: https://www.young.biz/  This test is not yet approved or cleared by the [place] FDA and  has been authorized for detection and/or diagnosis of SARS-CoV-2 by  FDA under an Emergency Use Authorization (EUA). This EUA will remain  in effect (meaning this test can be used) for the duration of the  Covid-19 declaration under Section 564(b)(1) of the Act, 21  U.S.C. section 360bbb-3(b)(1), unless the authorization is  terminated or revoked. Performed at [facility], [address] [place], [place] [address]   Urinalysis, Routine w reflex microscopic Urine, Clean Catch     Status: Abnormal   Collection Time: 05/10/20 10:27 PM  Result Value Ref Range   Color, Urine AMBER (A) YELLOW    Comment: BIOCHEMICALS MAY BE AFFECTED BY  COLOR   APPearance HAZY (A) CLEAR   Specific Gravity, Urine 1.021 1.005 - 1.030   pH 5.0 5.0 - 8.0   Glucose, UA NEGATIVE NEGATIVE mg/dL   Hgb urine dipstick NEGATIVE NEGATIVE   Bilirubin Urine NEGATIVE NEGATIVE   Ketones, ur 5 (A) NEGATIVE mg/dL   Protein, ur NEGATIVE NEGATIVE mg/dL   Nitrite NEGATIVE NEGATIVE   Leukocytes,Ua NEGATIVE NEGATIVE    Comment: Performed at [facility], [address] [place], [place] [address]  Comprehensive metabolic panel     Status: Abnormal   Collection Time: 05/11/20  3:16 AM  Result Value Ref Range   Sodium 139 135 - 145 mmol/L   Potassium 3.3 (L) 3.5 - 5.1 mmol/L   Chloride 108 98 - 111 mmol/L   CO2 23 22 - 32 mmol/L   Glucose, Bld 90 70 - 99 mg/dL    Comment: Glucose reference range applies only to samples taken after fasting for at least 8 hours.   BUN 36 (H) 8 - 23 mg/dL   Creatinine, Ser 9.02 (H) 0.61 - 1.24 mg/dL   Calcium 8.7 (L) 8.9 - 10.3 mg/dL   Total Protein 5.5 (L) 6.5 - 8.1 g/dL   Albumin 2.7 (L) 3.5 - 5.0 g/dL   AST 19 15 - 41 U/L   ALT 6 0 - 44 U/L   Alkaline Phosphatase 48 38 - 126 U/L   Total Bilirubin 1.0  0.3 - 1.2 mg/dL   GFR, Estimated 29 (L) >60 mL/min    Comment: (NOTE) Calculated using the CKD-EPI Creatinine Equation (2021)    Anion gap 8 5 - 15    Comment: Performed at [facility], [address] [place], [place] [address]  CBC     Status: Abnormal   Collection Time: 05/11/20  3:16 AM  Result Value Ref Range   WBC 5.6 4.0 - 10.5 K/uL   RBC 3.57 (L) 4.22 - 5.81 MIL/uL   Hemoglobin 10.6 (L) 13.0 - 17.0 g/dL   HCT 19.1 (L) 39 - 52 %   MCV 86.3 80.0 - 100.0 fL   MCH 29.7 26.0 - 34.0 pg   MCHC 34.4 30.0 - 36.0 g/dL   RDW 47.8 29.5 - 62.1 %   Platelets 182 150 - 400 K/uL   nRBC 0.0 0.0 - 0.2 %    Comment: Performed at [facility], [address], [place], [place] [address]  C-reactive protein     Status: Abnormal   Collection Time: 05/11/20  3:16 AM  Result Value Ref Range   CRP 11.5 (H)  <1.0 mg/dL    Comment: Performed at [facility], [address], [place], [place] [address]  CBC     Status: Abnormal   Collection Time: 05/12/20  1:24 AM  Result Value Ref Range   WBC 5.3 4.0 - 10.5 K/uL   RBC 3.41 (L) 4.22 - 5.81 MIL/uL   Hemoglobin 10.2 (L) 13.0 - 17.0 g/dL   HCT 62.9 (L) 39 - 52 %   MCV 85.9 80.0 - 100.0 fL   MCH 29.9 26.0 - 34.0 pg   MCHC 34.8 30.0 - 36.0 g/dL   RDW 52.8 41.3 - 24.4 %   Platelets 229 150 - 400 K/uL   nRBC 0.0 0.0 - 0.2 %    Comment: Performed at [facility], [address], [place], [place] [address]  Comprehensive metabolic panel     Status: Abnormal   Collection Time: 05/12/20  1:24 AM  Result Value Ref Range   Sodium 143 135 - 145 mmol/L   Potassium 3.4 (L) 3.5 - 5.1 mmol/L   Chloride 114 (H) 98 - 111 mmol/L   CO2 20 (L) 22 - 32 mmol/L   Glucose, Bld 98 70 - 99 mg/dL    Comment: Glucose reference range applies only to samples taken after fasting for at least 8 hours.   BUN 33 (H) 8 - 23 mg/dL   Creatinine, Ser 2.53 (H) 0.61 - 1.24 mg/dL   Calcium 8.6 (L) 8.9 - 10.3 mg/dL   Total Protein 5.4 (L) 6.5 - 8.1 g/dL   Albumin 2.6 (L) 3.5 - 5.0 g/dL   AST 17 15 - 41 U/L   ALT 11 0 - 44 U/L   Alkaline Phosphatase 46 38 - 126 U/L   Total Bilirubin 1.1 0.3 - 1.2 mg/dL   GFR, Estimated 39 (L) >60 mL/min    Comment: (NOTE) Calculated using the CKD-EPI Creatinine Equation (2021)    Anion gap 9 5 - 15    Comment: Performed at [facility], [address] [place], [place] [address]  Ammonia     Status: None   Collection Time: 05/12/20  1:34 AM  Result Value Ref Range   Ammonia 19 9 - 35 umol/L    Comment: Performed at [facility], [address], [place], [place] [address]  TSH  Status: None   Collection Time: 05/12/20  1:34 AM  Result Value Ref Range   TSH 1.824 0.350 - 4.500 uIU/mL    Comment: Performed by a 3rd Generation assay with a functional sensitivity of <=0.01 uIU/mL. Performed at [facility], [address] [place], [place] [id number]     CT Head Wo Contrast  Result Date: 05/10/2020 CLINICAL DATA:  Mental status changes EXAM: CT HEAD WITHOUT CONTRAST TECHNIQUE: Contiguous axial images were obtained from the base of the skull through the vertex without intravenous contrast. COMPARISON:  05/03/2020 FINDINGS: Brain: There is atrophy and chronic small vessel disease changes. No acute intracranial abnormality. Specifically, no hemorrhage, hydrocephalus, mass lesion, acute infarction, or significant intracranial injury. Vascular: No hyperdense vessel or unexpected calcification. Skull: No acute calvarial abnormality. Sinuses/Orbits: Visualized paranasal sinuses and mastoids clear. Orbital soft tissues unremarkable. Other: None IMPRESSION: Atrophy, chronic microvascular disease. No acute intracranial abnormality. Electronically Signed   By: Charlett NoseKevin  Dover M.D.   On: 05/10/2020 19:26   MR BRAIN WO CONTRAST  Result Date: 05/12/2020 CLINICAL DATA:  Delirium. EXAM: MRI HEAD WITHOUT CONTRAST TECHNIQUE: Multiplanar, multiecho pulse sequences of the brain and surrounding structures were obtained without intravenous contrast. COMPARISON:  Head CT May 10, 2020; MRI of the brain November 13, 20. FINDINGS: Brain: No acute infarction, hemorrhage, extra-axial collection or mass lesion. Scattered and confluent foci of T2 hyperintensity are seen within the white matter of the cerebral hemispheres. Prominence of the supratentorial ventricles and sylvian fissures with relative preservation of the high convexity sulci, may be seen the setting normal pressure hydrocephalus in the appropriate clinical scenario. Prominent volume loss of the mesial temporal lobes. No significant change from prior MRI. Vascular: Normal flow voids. Skull and upper cervical spine: Normal marrow signal. Sinuses/Orbits: Mucosal thickening the ethmoid cells and sphenoid sinuses. Right lens surgery. Other: Mild bilateral mastoid effusion.  IMPRESSION: 1. No acute intracranial abnormality. 2. Prominence of the supratentorial ventricles and Sylvian fissures with relative preservation of the high convexity sulci, may be seen the setting of normal pressure hydrocephalus in the appropriate clinical scenario. 3. Prominent volume loss of the mesial temporal lobes. 4. Moderate chronic small vessel ischemic changes. Electronically Signed   By: Baldemar LenisKatyucia  De [place]o Rodrigues M.D.   On: 05/12/2020 00:02   DG CHEST PORT 1 VIEW  Result Date: 05/11/2020 CLINICAL DATA:  Altered mental status. EXAM: PORTABLE CHEST 1 VIEW COMPARISON:  05/03/2020. FINDINGS: Cardiomegaly. No pulmonary venous congestion. Low lung volumes. No focal infiltrate. No pleural effusion or pneumothorax. Degenerative change thoracic spine. IMPRESSION: 1. Cardiomegaly. No pulmonary venous congestion. 2. Low lung volumes. No focal infiltrate. Electronically Signed   By: Maisie Fushomas  Register   On: 05/11/2020 07:30    Assessment: 79 yo male with a hx of PD, dementia, CVA, BPH, VI, CKD, and HTN. [facility] hospital discharge. He was sent back due to being "unresponsive" per wife and not talking. He has not been able to take his Sinemet for ? 1-2 days due to inability in the setting of AMS. A nasogastric tube was placed today, so pt should be able to restart his medications.  1. PD with dementia-the waxing and waning of his mentation is not unexpected in a synucleinopathy. No other lab or imaging findings to explain his cognitive fluctuation.  2. Hx cerebellar infarct. Confirmed as old on last hospitalization MRI.  3. MRI brain 11/19 mentions NPH, but on review of  images by Neurologist, the findings appear most consistent with age related atrophy, as the cortical sulci are also prominently widened.  4. COVID 19 +  Recommendations: 1. Need to ensure patient is getting his medications. Consider palliative care consult for goals of care to see  if family is even open to long term tube feeding and to dose medications.   2. Continue secondary stroke prevention with ASA and statin. 3. Proper PPE worn during visit, including gown, gloves, N95, shoe covers.  4. Avoid sedating medications.  5. Avoid neuroleptic medications.  6. OOB to chair with lights on during daytime hours. Bring back to bed with lights and TV off during nighttime hours.  7. Neurology will sign off. Please call if there are additional questions.   Jimmye Norman, NP/Neurology Pt seen by NP and discussed findings and plan with Dr. Otelia Limes.   I have seen and examined the patient. I have formulated the assessment and recommendations. 79 yo male with a hx of PD, dementia, CVA, BPH, VI, CKD, and HTN. He re- presents to [facility] after a 2 day stay at [facility] following last hospital discharge. He was sent back due to being "unresponsive" per wife and not talking. He has not been able to take his Sinemet for ? 1-2 days due to inability in the setting of AMS. A nasogastric tube was placed today, so pt should be able to restart his medications. Overall presentation is most consistent with a cognitive fluctuation in the setting of PD with dementia. He may also have worsened due to missed doses of Sinemet. Recommendations as above.  Electronically signed: Dr. Caryl Pina

## 2020-05-12 NOTE — Progress Notes (Signed)
   05/12/20 1009  Assess: MEWS Score  Temp 97.8 F (36.6 C)  BP (!) 174/85  Pulse Rate 71  ECG Heart Rate 71  Resp 12  SpO2 97 %  Assess: MEWS Score  MEWS Temp 0  MEWS Systolic 0  MEWS Pulse 0  MEWS RR 1  MEWS LOC 2  MEWS Score 3  MEWS Score Color Yellow

## 2020-05-12 NOTE — Procedures (Signed)
Cortrak  Person Inserting Tube:  Jaysa Kise, RD Tube Type:  Cortrak - 43 inches Tube Location:  Right nare Initial Placement:  Stomach Secured by: Bridle Technique Used to Measure Tube Placement:  Documented cm marking at nare/ corner of mouth Cortrak Secured At:  71 cm    Cortrak Tube Team Note:  Consult received to place a Cortrak feeding tube.   No x-ray is required. RN may begin using tube.    If the tube becomes dislodged please keep the tube and contact the Cortrak team at [email or web address] (password [email or web address]) for replacement.  If after hours and replacement cannot be delayed, place a NG tube and confirm placement with an abdominal x-ray.     Rivers Gassmann MS, RDN, LDN, CNSC Registered Dietitian III Clinical Nutrition RD Pager and On-Call Pager Number Located in Amion    

## 2020-05-12 NOTE — Progress Notes (Addendum)
PROGRESS NOTE                                                                             PROGRESS NOTE                                                                                                                                                                                                             Patient Demographics:    [name], is a 79 y.o. male, DOB - 10-27-1940, WUJ:[id number]  Outpatient Primary MD for the patient is Plotnikov, Georgina Quint, MD    LOS - 2  Admit date - 05/10/2020    Chief Complaint  Patient presents with  . Altered Mental Status       Brief Narrative    79 y.o. male with medical history significant of BPH, venous insufficiency, CKD, Parkinson's dementia, hypertension, falls, neutropenia, thrombocytopenia, recent admission (from 11/10-11/16 due to altered mental status, findings of old CVA, syncope, incidental Covid infection) who presents from his rehab facility with altered mental status.  Patient unable to participate in HPI due to altered mental status, history obtained via chart review and with assistance of family.             His wife states that she spoke with him by phone on Monday and he seemed to be his normal self.  However, when she went to see him at the facility on Wednesday he was no longer opening his eyes or speaking to her.  According to chart review, he was still alert on Tuesday when he was discharged.  Per chart review, facility.  Stated he had been nonresponsive since admission, but this is difficult to confirm.  He has had minimal p.o. intake or medications.  ED Course: Vital signs significant for hypertension to the 170s systolic, otherwise within normal limits.  Labs showed creatinine 2.53 from baseline of 1.5.  Hemoglobin 10.8 baseline of 1.6.  Calcium 8.5 with albumin 2.8.  UA within normal limits, CK within normal limits, RVP with known Covid.  CT  head showed no acute abnormality.   Subjective:     [name] with no significant events as discussed with staff, symptoms of cannot provide any complaints, but he does appear to be more interactive today.     Assessment  & Plan :    Principal Problem:   Acute encephalopathy Active Problems:   Essential hypertension   Chronic venous insufficiency   Dementia due to Parkinson's disease without behavioral disturbance (HCC)   Parkinson's syndrome (HCC)   History of CVA (cerebrovascular accident)  Acute encephalopathy/ Parkinson's dementia -With underlining Parkinson's dementia, he had recent admission with similar symptoms . -Symptoms appear to be worsened at the facility, he had extensive work-up during recent hospital stay, which did include MRI of the brain which did not show any acute changes, he had stable echo, EEG, carotid ultrasound, TEE, stable A1c, and LDL was around 90, for which he was started on statin and aspirin . -He is with fluctuating mentation, - Currently afebrile without other signs of infection, CT head stable, UA with normal limits -Continue to provide supportive care including IV fluids, monitor electrolytes closely and replete as needed. - Avoid benzos and narcotics  -He remains significantly altered, discussed with neurology, will need his Sinemet and rivastigmine, so core track nightly has been started, as well will start on tube feeds. -GI consulted, will await further input.  History of CVA - Old infarction noted on previous admission with no acute changes -Continue aspirin,   AKI on CKD 3 -  Creatinine 2.53 (baseline 1.5), in the setting of decreased p.o. intake for the last day and half or so.  Continue with IV fluids and avoid nephrotoxic medications.  Hypertension -Has been uncontrolled given he is n.p.o., he was on as needed IV hydralazine, now he has core track instructed we will continue his home meds..  Parkinson  disease -Followed by Dr. Karel Jarvis   Hypokalemia -repleted   SpO2: 98  %  Recent Labs  Lab 05/07/20 0106 05/07/20 0106 05/08/20 0127 05/09/20 0447 05/10/20 1715 05/10/20 2150 05/11/20 0316 05/12/20 0124  WBC 4.6   < > 2.7* 3.5* 6.9  --  5.6 5.3  PLT 96*   < > 105* 134* 174  --  182 229  CRP 8.0*  --  9.6* 4.3*  --   --  11.5*  --   BNP 113.4*  --  116.8* 126.2*  --   --   --   --   DDIMER 0.60*  --  0.76* 0.99*  --   --   --   --   AST 14*   < > 16 15 18   --  19 17  ALT <5   < > <5 6 <5  --  6 11  ALKPHOS 51   < > 51 54 46  --  48 46  BILITOT 1.2   < > 1.2 1.1 1.1  --  1.0 1.1  ALBUMIN 2.6*   < > 2.8* 2.8* 2.8*  --  2.7* 2.6*  SARSCOV2NAA  --   --   --   --   --  POSITIVE*  --   --    < > = values in this interval not displayed.       ABG     Component Value Date/Time   TCO2 23 04/22/2011 0342       Condition - Extremely Guarded  Family Communication  : I have D/W wife by phone 11/18   Code Status :  Full  Consults  :  None  Procedures  :  None   Disposition Plan  :    Status is: Inpatient  Remains inpatient appropriate because:IV treatments appropriate due to intensity of illness or inability to take PO   Dispo: The patient is from: SNF              Anticipated d/c is to: SNF              Anticipated d/c date is: 3 days              Patient currently is not medically stable to d/c.      DVT Prophylaxis  :  Lovenox   Lab Results  Component Value Date   PLT 229 05/12/2020    Diet :  Diet Order            Diet NPO time specified  Diet effective now                  Inpatient Medications  Scheduled Meds: . [START ON 05/13/2020] aspirin  81 mg Per Tube Daily  . carbidopa-levodopa  1 tablet Per Tube TID WC  . carvedilol  6.25 mg Per Tube BID WC  . cholecalciferol  1,000 Units Oral Daily  . [START ON 05/13/2020] enoxaparin (LOVENOX) injection  40 mg Subcutaneous Daily  . feeding supplement (PROSource TF)  45 mL Per Tube BID  . free water  155 mL Per Tube Q4H  . hydrALAZINE  100 mg Per Tube Q8H  .  rivastigmine  1.5 mg Per Tube BID WC  . sodium chloride flush  3 mL Intravenous Q12H  . [START ON 05/13/2020] vitamin B-12  1,000 mcg Per Tube Daily   Continuous Infusions: . dextrose 5 % and 0.9 % NaCl with KCl 20 mEq/L Stopped (05/12/20 1300)  . feeding supplement (OSMOLITE 1.5 CAL)     PRN Meds:.hydrALAZINE  Antibiotics  :    Anti-infectives (From admission, onward)   None        Huey Bienenstock M.D on 05/12/2020 at 3:24 PM  To page go to [email or web address]  [facility] -  Office  [phone]     Objective:   Vitals:   05/12/20 1009 05/12/20 1010 05/12/20 1034 05/12/20 1235  BP: (!) 174/85  (!) 162/82 (!) 191/89  Pulse: 71 68 66 72  Resp: 12 15 15 16   Temp: 97.8 F (36.6 C)  98 F (36.7 C) 98.2 F (36.8 C)  TempSrc: Axillary  Axillary Axillary  SpO2: 97% 97% 98% 98%  Weight:      Height:        Wt Readings from Last 3 Encounters:  05/12/20 72.7 kg  05/04/20 77.5 kg  04/13/20 77.5 kg     Intake/Output Summary (Last 24 hours) at 05/12/2020 1524 Last data filed at 05/12/2020 0425 Gross per 24 hour  Intake 289.53 ml  Output 900 ml  Net -610.47 ml     Physical Exam  Patient is sleeping comfortably, opening his eyes intermittently when asked to, mumbling yes to some questions, he is still not cooperative does not follow any commands but more awake than yesterday . Good air entry bilaterally, no wheezing  Regular rate and rhythm, no rubs or gallops  abd soft, nontender. Ext with no edema,clubbing or cyanosis    Data Review:    CBC Recent Labs  Lab 05/07/20 0106 05/07/20 0106 05/08/20 0127 05/09/20 0447 05/10/20 1715 05/11/20 0316 05/12/20 0124  WBC 4.6   < >  2.7* 3.5* 6.9 5.6 5.3  HGB 10.4*   < > 11.2* 11.6* 10.8* 10.6* 10.2*  HCT 29.7*   < > 31.9* 32.5* 31.2* 30.8* 29.3*  PLT 96*   < > 105* 134* 174 182 229  MCV 86.3   < > 83.9 82.7 86.7 86.3 85.9  MCH 30.2   < > 29.5 29.5 30.0 29.7 29.9  MCHC 35.0   < > 35.1 35.7 34.6 34.4 34.8   RDW 11.5   < > 11.3* 11.2* 11.7 11.6 11.5  LYMPHSABS 1.0  --  1.3 1.2 1.4  --   --   MONOABS 0.4  --  0.3 0.4 0.6  --   --   EOSABS 0.0  --  0.1 0.0 0.0  --   --   BASOSABS 0.0  --  0.0 0.0 0.0  --   --    < > = values in this interval not displayed.    Recent Labs  Lab 05/07/20 0106 05/07/20 0106 05/08/20 0127 05/09/20 0447 05/10/20 1715 05/11/20 0316 05/12/20 0124 05/12/20 0134  NA 139   < > 137 137 136 139 143  --   K 3.9   < > 3.8 3.4* 3.5 3.3* 3.4*  --   CL 109   < > 105 104 106 108 114*  --   CO2 25   < > 24 24 21* 23 20*  --   GLUCOSE 101*   < > 104* 103* 117* 90 98  --   BUN 17   < > 18 19 36* 36* 33*  --   CREATININE 1.70*   < > 1.49* 1.47* 2.53* 2.22* 1.73*  --   CALCIUM 8.1*   < > 8.6* 8.6* 8.5* 8.7* 8.6*  --   AST 14*   < > 16 15 18 19 17   --   ALT <5   < > <5 6 5 6 11   --   ALKPHOS 51   < > 51 54 46 48 46  --   BILITOT 1.2   < > 1.2 1.1 1.1 1.0 1.1  --   ALBUMIN 2.6*   < > 2.8* 2.8* 2.8* 2.7* 2.6*  --   MG 1.5*  --  1.5* 2.2  --   --   --   --   CRP 8.0*  --  9.6* 4.3*  --  11.5*  --   --   DDIMER 0.60*  --  0.76* 0.99*  --   --   --   --   TSH  --   --   --   --   --   --   --  1.824  AMMONIA  --   --   --   --   --   --   --  19  BNP 113.4*  --  116.8* 126.2*  --   --   --   --    < > = values in this interval not displayed.    ------------------------------------------------------------------------------------------------------------------ No results for input(s): CHOL, HDL, LDLCALC, TRIG, CHOLHDL, LDLDIRECT in the last 72 hours.  Lab Results  Component Value Date   HGBA1C 5.4 05/05/2020   ------------------------------------------------------------------------------------------------------------------ Recent Labs    05/12/20 0134  TSH 1.824    Cardiac Enzymes No results for input(s): CKMB, TROPONINI, MYOGLOBIN in the last 168 hours.  Invalid input(s):  CK ------------------------------------------------------------------------------------------------------------------    Component Value Date/Time   BNP 126.2 (H) 05/09/2020 0447    Micro  Results Recent Results (from the past 240 hour(s))  Respiratory Panel by RT PCR (Flu A&B, Covid) - Nasopharyngeal Swab     Status: Abnormal   Collection Time: 05/03/20  1:13 PM   Specimen: Nasopharyngeal Swab  Result Value Ref Range Status   SARS Coronavirus 2 by RT PCR POSITIVE (A) NEGATIVE Final    Comment: RESULT CALLED TO, READ BACK BY AND VERIFIED WITH: FRANKLIN,C. RN @1510  05/03/20 BILLINGSLEY,L    Influenza A by PCR NEGATIVE NEGATIVE Final   Influenza B by PCR NEGATIVE NEGATIVE Final    Comment: Performed at [facility], [address] [place], [place] [address]  Culture, blood (single)     Status: None   Collection Time: 05/03/20  4:32 PM   Specimen: BLOOD RIGHT FOREARM  Result Value Ref Range Status   Specimen Description   Final    BLOOD RIGHT FOREARM Performed at [facility], [address], [place], [place] [address]    Special Requests   Final    BOTTLES DRAWN AEROBIC AND ANAEROBIC Blood Culture adequate volume Performed at [facility], [address] [place], [place] [address]    Culture   Final    NO GROWTH 5 DAYS Performed at [facility], [address] [place], [place] [address]    Report Status 05/08/2020 FINAL  Final  Respiratory Panel by RT PCR (Flu A&B, Covid) - Nasopharyngeal Swab     Status: Abnormal   Collection Time: 05/10/20  9:50 PM   Specimen: Nasopharyngeal Swab  Result Value Ref Range Status   SARS Coronavirus 2 by RT PCR POSITIVE (A) NEGATIVE Final    Comment: RESULT CALLED TO, READ BACK BY AND VERIFIED WITH: Kerrin Champagne RN 05/11/20 AT 0002 SK (NOTE) SARS-CoV-2 target nucleic acids are DETECTED.  SARS-CoV-2 RNA is generally detectable in upper respiratory specimens  during the acute  phase of infection. Positive results are indicative of the presence of the identified virus, but do not rule out bacterial infection or co-infection with other pathogens not detected by the test. Clinical correlation with patient history and other diagnostic information is necessary to determine patient infection status. The expected result is Negative.  Fact Sheet for Patients:  https://www.moore.com/  Fact Sheet for Healthcare Providers: https://www.young.biz/  This test is not yet approved or cleared by the [place] FDA and  has been authorized for detection and/or diagnosis of SARS-CoV-2 by FDA under an Emergency Use Authorization (EUA).  This EUA will remain in effect (meaning this test can be [place] ed) for the duration of  the COVID-19 declaration under Section 564(b)(1) of the Act, 21 U.S.C. section 360bbb-3(b)(1), unless the authorization is terminated or revoked sooner.      Influenza A by PCR NEGATIVE NEGATIVE Final   Influenza B by PCR NEGATIVE NEGATIVE Final    Comment: (NOTE) The Xpert Xpress SARS-CoV-2/FLU/RSV assay is intended as an aid in  the diagnosis of influenza from Nasopharyngeal swab specimens and  should not be used as a sole basis for treatment. Nasal washings and  aspirates are unacceptable for Xpert Xpress SARS-CoV-2/FLU/RSV  testing.  Fact Sheet for Patients: https://www.moore.com/  Fact Sheet for Healthcare Providers: https://www.young.biz/  This test is not yet approved or cleared by the [place] FDA and  has been authorized for detection and/or diagnosis of SARS-CoV-2 by  FDA under an Emergency Use Authorization (EUA). This EUA will remain  in effect (meaning this test can be used) for the duration  of the  Covid-19 declaration under Section 564(b)(1) of the Act, 21  U.S.C. section 360bbb-3(b)(1), unless the authorization is  terminated or revoked. Performed at  [facility], [address], [place], [place] [address]     Radiology Reports EEG  Result Date: 05/05/2020 Charlsie Quest, MD     05/05/2020  9:30 AM Patient Name: KEAHI MCCARNEY MRN: [id number] Epilepsy Attending: Charlsie Quest Referring Physician/Provider: Dr Albertine Grates Date: 05/04/2020 Duration: 23.26 minutes Patient history: 79 year old male with episodes of syncope x2.  EEG to evaluate for seizures. Level of alertness: Awake, drowsy, sleep, comatose, lethargic AEDs during EEG study: None Technical aspects: This EEG study was done with scalp electrodes positioned according to the 10-20 International system of electrode placement. Electrical activity was acquired at a sampling rate of 500Hz  and reviewed with a high frequency filter of 70Hz  and a low frequency filter of 1Hz . EEG data were recorded continuously and digitally stored. Description: The posterior dominant rhythm consists of 8 Hz activity of moderate voltage (25-35 uV) seen predominantly in posterior head regions, symmetric and reactive to eye opening and eye closing. EEG showed continuous generalized 5 to 6 Hz theta as well as intermittent 2 to 3 Hz delta slowing. Hyperventilation and photic stimulation were not performed.   ABNORMALITY -Continuous slow, generalized IMPRESSION: This study is suggestive of mild diffuse encephalopathy, nonspecific etiology. No seizures or epileptiform discharges were seen throughout the recording.   DG Chest 2 View  Result Date: 05/03/2020 CLINICAL DATA:  79 year old male with a history of syncope EXAM: CHEST - 2 VIEW COMPARISON:  07/03/2019 FINDINGS: Cardiomediastinal silhouette likely unchanged in size and contour given the positioning. No pneumothorax. No pleural effusion. Coarsened interstitial markings bilateral lungs without confluent airspace disease. No acute displaced fracture. Degenerative changes of the visualized spine. IMPRESSION: Negative for acute cardiopulmonary  disease Electronically Signed   By: 13/03/2020 D.O.   On: 05/03/2020 13:50   CT Head Wo Contrast  Result Date: 05/10/2020 CLINICAL DATA:  Mental status changes EXAM: CT HEAD WITHOUT CONTRAST TECHNIQUE: Contiguous axial images were obtained from the base of the skull through the vertex without intravenous contrast. COMPARISON:  05/03/2020 FINDINGS: Brain: There is atrophy and chronic small vessel disease changes. No acute intracranial abnormality. Specifically, no hemorrhage, hydrocephalus, mass lesion, acute infarction, or significant intracranial injury. Vascular: No hyperdense vessel or unexpected calcification. Skull: No acute calvarial abnormality. Sinuses/Orbits: Visualized paranasal sinuses and mastoids clear. Orbital soft tissues unremarkable. Other: None IMPRESSION: Atrophy, chronic microvascular disease. No acute intracranial abnormality. Electronically Signed   By: 13/03/2020 M.D.   On: 05/10/2020 19:26   CT Head Wo Contrast  Result Date: 05/03/2020 CLINICAL DATA:  Mental status change, unknown cause. EXAM: CT HEAD WITHOUT CONTRAST TECHNIQUE: Contiguous axial images were obtained from the base of the skull through the vertex without intravenous contrast. COMPARISON:  Head CT 02/16/2019.  Brain MRI 11/23/2018. FINDINGS: Brain: Moderate cerebral atrophy. Moderate ill-defined hypoattenuation within the cerebral white matter is nonspecific, but compatible with chronic small vessel ischemic disease. A small infarct is questioned within the posterior left cerebellar hemisphere, new from the prior head CT of 02/16/2019 but otherwise age indeterminate (series 2, image 9) (series 5, image 56). There is no acute intracranial hemorrhage. No demarcated cortical infarct. No extra-axial fluid collection. No evidence of intracranial mass. No midline shift. Vascular: No hyperdense vessel.  Atherosclerotic calcifications. Skull: Normal. Negative for fracture or focal lesion. Sinuses/Orbits: Visualized orbits  show no  acute finding. Mild ethmoid and maxillary sinus mucosal thickening. IMPRESSION: A small infarct is questioned within the posterior left cerebellar hemisphere, new from the prior head CT of 02/16/2019 but otherwise age indeterminate. Moderate cerebral atrophy and chronic small vessel ischemic disease. Mild ethmoid and maxillary sinus mucosal thickening. Electronically Signed   By: Jackey Loge DO   On: 05/03/2020 19:26   MR ANGIO HEAD WO CONTRAST  Result Date: 05/06/2020 CLINICAL DATA:  Posterior cerebellar infarction, age indeterminate. EXAM: MRI HEAD WITHOUT CONTRAST MRA HEAD WITHOUT CONTRAST MRA NECK WITHOUT CONTRAST TECHNIQUE: Multiplanar, multiecho pulse sequences of the brain and surrounding structures were obtained without intravenous contrast. Angiographic images of the Circle of Willis were obtained using MRA technique without intravenous contrast. Angiographic images of the neck were obtained using MRA technique without intravenous contrast. Carotid stenosis measurements (when applicable) are obtained utilizing NASCET criteria, using the distal internal carotid diameter as the denominator. COMPARISON:  Head CT 05/03/2020 FINDINGS: MRI HEAD FINDINGS Brain: Diffusion imaging does not show any acute or subacute infarction. There are old small vessel infarctions within the left posterior cerebellum. Mild chronic small-vessel change affects the pons. Cerebral hemispheres show generalized atrophy, temporal lobe predominant. There are moderate chronic small-vessel ischemic changes of the deep white matter. No large vessel territory cerebral hemispheric infarction. No mass lesion, hemorrhage, hydrocephalus or extra-axial collection. Vascular: Major vessels at the base of the brain show flow. Skull and upper cervical spine: Negative Sinuses/Orbits: Mild mucosal inflammatory changes of the sinuses. Orbits negative. Other: None MRA HEAD FINDINGS Both internal carotid arteries widely patent through the  skull base and siphon regions. No siphon stenosis. The anterior and middle cerebral vessels are patent without proximal stenosis, aneurysm or vascular malformation. No large or medium vessel occlusion. Fenestrated left M1 segment. Both vertebral arteries are patent to the basilar. No basilar stenosis. Posterior circulation branch vessels appear normal. MRA NECK FINDINGS Limited resolution due to noncontrast technique. Both common carotid arteries show antegrade flow to the bifurcation regions. On the right, there is no apparent stenosis. On the left, there is too much artifact to evaluate the bifurcation region. Antegrade flow is present in both vertebral arteries. IMPRESSION: No acute intracranial finding. 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 Moderate chronic small-vessel ischemic changes of the cerebral hemispheric deep white matter. Generalized atrophy, temporal lobe predominant. 1. MRA head: No large or medium vessel occlusion or correctable proximal stenosis. 2. MRA neck: Allowing for noncontrast technique, there is no visible carotid bifurcation stenosis on the right. Left carotid bifurcation cannot be seen because of artifact. Antegrade flow is present in both vertebral arteries. Electronically Signed   By: Paulina Fusi M.D.   On: 05/06/2020 13:56   MR ANGIO NECK WO CONTRAST  Result Date: 05/06/2020 CLINICAL DATA:  Posterior cerebellar infarction, age indeterminate. EXAM: MRI HEAD WITHOUT CONTRAST MRA HEAD WITHOUT CONTRAST MRA NECK WITHOUT CONTRAST TECHNIQUE: Multiplanar, multiecho pulse sequences of the brain and surrounding structures were obtained without intravenous contrast. Angiographic images of the Circle of Willis were obtained using MRA technique without intravenous contrast. Angiographic images of the neck were obtained using MRA technique without intravenous contrast. Carotid stenosis measurements (when applicable) are obtained utilizing NASCET criteria, using the distal  internal carotid diameter as the denominator. COMPARISON:  Head CT 05/03/2020 FINDINGS: MRI HEAD FINDINGS Brain: Diffusion imaging does not show any acute or subacute infarction. There are old small vessel infarctions within the left posterior cerebellum. Mild chronic small-vessel change affects the pons. Cerebral hemispheres show generalized  atrophy, temporal lobe predominant. There are moderate chronic small-vessel ischemic changes of the deep white matter. No large vessel territory cerebral hemispheric infarction. No mass lesion, hemorrhage, hydrocephalus or extra-axial collection. Vascular: Major vessels at the base of the brain show flow. Skull and upper cervical spine: Negative Sinuses/Orbits: Mild mucosal inflammatory changes of the sinuses. Orbits negative. Other: None MRA HEAD FINDINGS Both internal carotid arteries widely patent through the skull base and siphon regions. No siphon stenosis. The anterior and middle cerebral vessels are patent without proximal stenosis, aneurysm or vascular malformation. No large or medium vessel occlusion. Fenestrated left M1 segment. Both vertebral arteries are patent to the basilar. No basilar stenosis. Posterior circulation branch vessels appear normal. MRA NECK FINDINGS Limited resolution due to noncontrast technique. Both common carotid arteries show antegrade flow to the bifurcation regions. On the right, there is no apparent stenosis. On the left, there is too much artifact to evaluate the bifurcation region. Antegrade flow is present in both vertebral arteries. IMPRESSION: No acute intracranial finding. 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 Moderate chronic small-vessel ischemic changes of the cerebral hemispheric deep white matter. Generalized atrophy, temporal lobe predominant. 1. MRA head: No large or medium vessel occlusion or correctable proximal stenosis. 2. MRA neck: Allowing for noncontrast technique, there is no visible carotid  bifurcation stenosis on the right. Left carotid bifurcation cannot be seen because of artifact. Antegrade flow is present in both vertebral arteries. Electronically Signed   By: Paulina Fusi M.D.   On: 05/06/2020 13:56   MR BRAIN WO CONTRAST  Result Date: 05/12/2020 CLINICAL DATA:  Delirium. EXAM: MRI HEAD WITHOUT CONTRAST TECHNIQUE: Multiplanar, multiecho pulse sequences of the brain and surrounding structures were obtained without intravenous contrast. COMPARISON:  Head CT May 10, 2020; MRI of the brain November 13, 20. FINDINGS: Brain: No acute infarction, hemorrhage, extra-axial collection or mass lesion. Scattered and confluent foci of T2 hyperintensity are seen within the white matter of the cerebral hemispheres. Prominence of the supratentorial ventricles and sylvian fissures with relative preservation of the high convexity sulci, may be seen the setting normal pressure hydrocephalus in the appropriate clinical scenario. Prominent volume loss of the mesial temporal lobes. No significant change from prior MRI. Vascular: Normal flow voids. Skull and upper cervical spine: Normal marrow signal. Sinuses/Orbits: Mucosal thickening the ethmoid cells and sphenoid sinuses. Right lens surgery. Other: Mild bilateral mastoid effusion. IMPRESSION: 1. No acute intracranial abnormality. 2. Prominence of the supratentorial ventricles and Sylvian fissures with relative preservation of the high convexity sulci, may be seen the setting of normal pressure hydrocephalus in the appropriate clinical scenario. 3. Prominent volume loss of the mesial temporal lobes. 4. Moderate chronic small vessel ischemic changes. Electronically Signed   By: Baldemar Lenis M.D.   On: 05/12/2020 00:02   MR BRAIN WO CONTRAST  Result Date: 05/06/2020 CLINICAL DATA:  Posterior cerebellar infarction, age indeterminate. EXAM: MRI HEAD WITHOUT CONTRAST MRA HEAD WITHOUT CONTRAST MRA NECK WITHOUT CONTRAST TECHNIQUE: Multiplanar,  multiecho pulse sequences of the brain and surrounding structures were obtained without intravenous contrast. Angiographic images of the Circle of Willis were obtained using MRA technique without intravenous contrast. Angiographic images of the neck were obtained using MRA technique without intravenous contrast. Carotid stenosis measurements (when applicable) are obtained utilizing NASCET criteria, using the distal internal carotid diameter as the denominator. COMPARISON:  Head CT 05/03/2020 FINDINGS: MRI HEAD FINDINGS Brain: Diffusion imaging does not show any acute or subacute infarction. There are old small vessel  infarctions within the left posterior cerebellum. Mild chronic small-vessel change affects the pons. Cerebral hemispheres show generalized atrophy, temporal lobe predominant. There are moderate chronic small-vessel ischemic changes of the deep white matter. No large vessel territory cerebral hemispheric infarction. No mass lesion, hemorrhage, hydrocephalus or extra-axial collection. Vascular: Major vessels at the base of the brain show flow. Skull and upper cervical spine: Negative Sinuses/Orbits: Mild mucosal inflammatory changes of the sinuses. Orbits negative. Other: None MRA HEAD FINDINGS Both internal carotid arteries widely patent through the skull base and siphon regions. No siphon stenosis. The anterior and middle cerebral vessels are patent without proximal stenosis, aneurysm or vascular malformation. No large or medium vessel occlusion. Fenestrated left M1 segment. Both vertebral arteries are patent to the basilar. No basilar stenosis. Posterior circulation branch vessels appear normal. MRA NECK FINDINGS Limited resolution due to noncontrast technique. Both common carotid arteries show antegrade flow to the bifurcation regions. On the right, there is no apparent stenosis. On the left, there is too much artifact to evaluate the bifurcation region. Antegrade flow is present in both vertebral  arteries. IMPRESSION: No acute intracranial finding. 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 Moderate chronic small-vessel ischemic changes of the cerebral hemispheric deep white matter. Generalized atrophy, temporal lobe predominant. 1. MRA head: No large or medium vessel occlusion or correctable proximal stenosis. 2. MRA neck: Allowing for noncontrast technique, there is no visible carotid bifurcation stenosis on the right. Left carotid bifurcation cannot be seen because of artifact. Antegrade flow is present in both vertebral arteries. Electronically Signed   By: Paulina Fusi M.D.   On: 05/06/2020 13:56   DG CHEST PORT 1 VIEW  Result Date: 05/11/2020 CLINICAL DATA:  Altered mental status. EXAM: PORTABLE CHEST 1 VIEW COMPARISON:  05/03/2020. FINDINGS: Cardiomegaly. No pulmonary venous congestion. Low lung volumes. No focal infiltrate. No pleural effusion or pneumothorax. Degenerative change thoracic spine. IMPRESSION: 1. Cardiomegaly. No pulmonary venous congestion. 2. Low lung volumes. No focal infiltrate. Electronically Signed   By: Maisie Fus  Register   On: 05/11/2020 07:30   VAS US CAROTID  Result Date: 05/06/2020 Carotid Arterial Duplex Study Indications:       CVA. Risk Factors:      Hypertension. Limitations        Today's exam was limited due to the patient's respiratory                    variation. Comparison Study:  No prior studies. Performing Technologist: Chanda Busing RVT  Examination Guidelines: A complete evaluation includes B-mode imaging, spectral Doppler, color Doppler, and power Doppler as needed of all accessible portions of each vessel. Bilateral testing is considered an integral part of a complete examination. Limited examinations for reoccurring indications may be performed as noted.  Right Carotid Findings: +----------+--------+--------+--------+-----------------------+--------+           PSV cm/sEDV cm/sStenosisPlaque Description     Comments  +----------+--------+--------+--------+-----------------------+--------+ CCA Prox  111     14              smooth and heterogenous         +----------+--------+--------+--------+-----------------------+--------+ CCA Distal78      14              smooth and heterogenous         +----------+--------+--------+--------+-----------------------+--------+ ICA Prox  97      16              smooth and heterogenous         +----------+--------+--------+--------+-----------------------+--------+  ICA Distal51      14                                     tortuous +----------+--------+--------+--------+-----------------------+--------+ ECA       224     26                                              +----------+--------+--------+--------+-----------------------+--------+ +----------+--------+-------+--------+-------------------+           PSV cm/sEDV cmsDescribeArm Pressure (mmHG) +----------+--------+-------+--------+-------------------+ [id number]                                        +----------+--------+-------+--------+-------------------+ +---------+--------+--+--------+--+---------+ VertebralPSV cm/s59EDV cm/s12Antegrade +---------+--------+--+--------+--+---------+  Left Carotid Findings: +----------+--------+--------+--------+-----------------------+--------+           PSV cm/sEDV cm/sStenosisPlaque Description     Comments +----------+--------+--------+--------+-----------------------+--------+ CCA Prox  107     14              smooth and heterogenous         +----------+--------+--------+--------+-----------------------+--------+ CCA Distal111     14              smooth and heterogenous         +----------+--------+--------+--------+-----------------------+--------+ ICA Prox  133     21              smooth and heterogenous         +----------+--------+--------+--------+-----------------------+--------+ ICA Distal88      24                                      tortuous +----------+--------+--------+--------+-----------------------+--------+ ECA       107     9                                               +----------+--------+--------+--------+-----------------------+--------+ +----------+--------+--------+--------+-------------------+           PSV cm/sEDV cm/sDescribeArm Pressure (mmHG) +----------+--------+--------+--------+-------------------+ Subclavian200                                         +----------+--------+--------+--------+-------------------+ +---------+--------+--+--------+--+---------+ VertebralPSV cm/s57EDV cm/s12Antegrade +---------+--------+--+--------+--+---------+   Summary: Right Carotid: Velocities in the right ICA are consistent with a 1-39% stenosis. Left Carotid: Velocities in the left ICA are consistent with a 1-39% stenosis. Vertebrals: Bilateral vertebral arteries demonstrate antegrade flow. *See table(s) above for measurements and observations.  Electronically signed by Fabienne Bruns MD on 05/06/2020 at 11:49:43 AM.    Final    ECHOCARDIOGRAM LIMITED  Result Date: 05/04/2020    ECHOCARDIOGRAM LIMITED REPORT   Patient Name:   DARVIS CROFT Date of Exam: 05/04/2020 Medical Rec #:  [id number]      Height:       68.0 in Accession #:    [id number]     Weight:       170.9 lb Date of Birth:  06-20-1941  BSA:          1.911 m Patient Age:    79 years       BP:           180/84 mmHg Patient Gender: M              HR:           70 bpm. Exam Location:  Inpatient Procedure: Limited Echo, Cardiac Doppler and Color Doppler Indications:    Syncope  History:        Patient has no prior history of Echocardiogram examinations.                 COPD, Signs/Symptoms:Syncope; Risk Factors:Hypertension,                 Dyslipidemia and Former Smoker. Parkinsons Dementia, COVID+.  Sonographer:    Lavenia AtlasBrooke Strickland Referring Phys: Albertine GratesXU, FANG  Sonographer Comments: Technically challenging study due to  limited acoustic windows. Image acquisition challenging due to COPD. IMPRESSIONS  1. Left ventricular ejection fraction, by estimation, is 55 to 60%. The left ventricle has normal function. There is mild left ventricular hypertrophy. Left ventricular diastolic parameters are consistent with Grade I diastolic dysfunction (impaired relaxation).  2. Right ventricular systolic function is normal. The right ventricular size is normal. Tricuspid regurgitation signal is inadequate for assessing PA pressure.  3. Left atrial size was mildly dilated.  4. The mitral valve is normal in structure. No evidence of mitral valve regurgitation. No evidence of mitral stenosis.  5. The aortic valve was not well visualized. Aortic valve regurgitation is trivial. No aortic stenosis is present.  6. Technically difficult study with poor acoustic windows. FINDINGS  Left Ventricle: Left ventricular ejection fraction, by estimation, is 55 to 60%. The left ventricle has normal function. The left ventricular internal cavity size was normal in size. There is mild left ventricular hypertrophy. Left ventricular diastolic  parameters are consistent with Grade I diastolic dysfunction (impaired relaxation). Right Ventricle: The right ventricular size is normal. No increase in right ventricular wall thickness. Right ventricular systolic function is normal. Tricuspid regurgitation signal is inadequate for assessing PA pressure. Left Atrium: Left atrial size was mildly dilated. Right Atrium: Right atrial size was normal in size. Mitral Valve: The mitral valve is normal in structure. No evidence of mitral valve stenosis. Tricuspid Valve: The tricuspid valve is normal in structure. Tricuspid valve regurgitation is not demonstrated. Aortic Valve: The aortic valve was not well visualized. Aortic valve regurgitation is trivial. No aortic stenosis is present. Pulmonic Valve: The pulmonic valve was not well visualized. Aorta: The aortic root is normal in size  and structure. Venous: The inferior vena cava was not well visualized. IAS/Shunts: No atrial level shunt detected by color flow Doppler. LEFT VENTRICLE PLAX 2D LVIDd:         3.70 cm Diastology LVIDs:         2.60 cm LV e' medial:    4.79 cm/s LV PW:         1.40 cm LV E/e' medial:  9.3 LV IVS:        1.20 cm LV e' lateral:   5.77 cm/s                        LV E/e' lateral: 7.7  RIGHT VENTRICLE RV S prime:     7.07 cm/s MITRAL VALVE MV Area (PHT): 2.22 cm MV Decel Time: 342 msec MV E velocity: 44.60  cm/s MV A velocity: 62.60 cm/s MV E/A ratio:  0.71 Marca Ancona MD Electronically signed by Marca Ancona MD Signature Date/Time: 05/04/2020/3:45:20 PM    Final

## 2020-05-12 NOTE — Progress Notes (Signed)
Initial Nutrition Assessment  DOCUMENTATION CODES:   Not applicable  INTERVENTION:   -Once cortrak tube is placed:  Initiate Osmolite 1.5 @ 20 ml/hr and increase by 10 ml every 8 hours to goal rate of 60 ml/hr.   45 ml Prosource TF BID.  155 ml free water flush every 4 hours    Tube feeding regimen provides 2240 kcal (100% of needs), 112 grams of protein, and 1097 ml of H2O. Total free water: 2017 ml daily  -Monitor K, Mg, and Phos daily and replete as needed due to high refeeding risk   NUTRITION DIAGNOSIS:   Inadequate oral intake related to lethargy/confusion, inability to eat as evidenced by NPO status.  GOAL:   Patient will meet greater than or equal to 90% of their needs  MONITOR:   Diet advancement, Labs, Weight trends, TF tolerance, Skin, I & O's  REASON FOR ASSESSMENT:   Rounds    ASSESSMENT:   79 y.o. male with medical history significant of BPH, venous insufficiency, CKD, Parkinson's dementia, hypertension, falls, neutropenia, thrombocytopenia, recent admission (from 11/10-11/16 due to altered mental status, findings of old CVA, syncope, incidental Covid infection) who presents from his rehab facility with altered mental status.  Pt admitted with acute encephalopathy, Parkinson's dementia, and incidental finding of COVID-19 infection.   Reviewed I/O's: +693 ml x 24 hours  UOP: 900 ml x 24 hours  Attempted to speak with pt via call to hospital room phone, however, unable to reach.   Case discussed with MD, who confirmed plan to place cortrak tube. Per MD, tube is primarily for medications, but agreeable to TF until pt is alert enough to take PO's. RD received permission from MD to initiate and manage TF.   Per H&P, pt has AMS and minimal PO intake over the past 2 days.   Reviewed wt hx; pt has experienced a 6.2% wt loss over the past month, which is significant for time frame.   Pt is at high risk for malnutrition, but unable to to identify at this  time.   Medications reviewed and include sinemet.   Labs reviewed: K: 3.4.  Diet Order:   Diet Order            Diet NPO time specified  Diet effective now                 EDUCATION NEEDS:   No education needs have been identified at this time  Skin:  Skin Assessment: Reviewed RN Assessment  Last BM:  05/11/20  Height:   Ht Readings from Last 1 Encounters:  05/12/20 5\' 8"  (1.727 m)    Weight:   Wt Readings from Last 1 Encounters:  05/12/20 72.7 kg    Ideal Body Weight:  70 kg  BMI:  Body mass index is 24.37 kg/m.  Estimated Nutritional Needs:   Kcal:  2200-2400  Protein:  110-125 grams  Fluid:  > 2 L    05/14/20, RD, LDN, CDCES Registered Dietitian II Certified Diabetes Care and Education Specialist Please refer to [facility] for RD and/or RD on-call/weekend/after hours pager

## 2020-05-12 NOTE — Evaluation (Signed)
Physical Therapy Evaluation Patient Details Name: [name] MRN: [id number] DOB: 08/24/1940 Today's Date: 05/12/2020   History of Present Illness  79yo male with recent admit 11/10 to 11/16 due to AMS/syncope/incidental Covid, now returning to the ED from his SNF with AMS and non-responsiveness. Admitted with acute encephalopathy and parkinsons dementia in the context of Covid. PMH HLD, HTN, parasomnia, CKD, parkinsons dementia, falls  Clinical Impression   Patient received in bed, very lethargic; able to rouse him a bit with heavy repeated multimodal cuing and VC/TC, but he keeps his eyes shut for the entire session and mumbles quite a bit, difficult to understand. Physically resisted me trying to open his eyelid in an effort to reduce lethargy. Able to follow simple cues such as "lift your arm" with repeated, multimodal cuing but not consistent. Needed totalA for even partial rolling in bed and with surely require +2 assist to progress mobility moving forward. VSS on RA but session extremely limited. Left in bed with all needs met, bed alarm active. Recommend return to SNF and 24/7A.     Follow Up Recommendations SNF;Supervision/Assistance - 24 hour    Equipment Recommendations  Other (comment) (defer to next venue)    Recommendations for Other Services       Precautions / Restrictions Precautions Precautions: Fall Restrictions Weight Bearing Restrictions: No      Mobility  Bed Mobility   Bed Mobility: Rolling     Supine to sit: Total assist     General bed mobility comments: able to perform partial roll with totalA, zero initiation or assist given from patient    Transfers                 General transfer comment: deferred- will need +2 to progress  Ambulation/Gait             General Gait Details: deferred- will need +2 to progress  Stairs            Wheelchair Mobility    Modified Rankin (Stroke Patients Only)       Balance                                              Pertinent Vitals/Pain Pain Assessment: Faces Faces Pain Scale: No hurt Pain Intervention(s): Monitored during session    Home Living Family/patient expects to be discharged to:: Skilled nursing facility Living Arrangements: Other (Comment)               Additional Comments: unable to answer PLOF or history questions due to lethargy    Prior Function Level of Independence: Needs assistance   Gait / Transfers Assistance Needed: Per chart has had recent falls at home. Per RN, wife reports pt ambulatory in the house.  ADL's / Homemaking Assistance Needed: Needs assistance for spouse - unknown how much assistance.  Comments: patient unable to provide history- information taken from prior charting     Hand Dominance   Dominant Hand: Right    Extremity/Trunk Assessment   Upper Extremity Assessment Upper Extremity Assessment: Defer to OT evaluation    Lower Extremity Assessment Lower Extremity Assessment: Generalized weakness;Difficult to assess due to impaired cognition    Cervical / Trunk Assessment Cervical / Trunk Assessment: Kyphotic  Communication   Communication: Other (comment);Expressive difficulties (lethargic and mumbling)  Cognition Arousal/Alertness: Lethargic Behavior During Therapy: Flat affect Overall Cognitive   Status: Difficult to assess                                 General Comments: lethargic today- responds to heavy multimodal cuing and able to follow very simple cues with repeated multimodal cuing, but inconsistent. Difficult to understand due to mumbling. Eyes closed during session.      General Comments General comments (skin integrity, edema, etc.): unable to safely get to EOB with +1 assist, will need 2 helpers to get to EOB/OOB; VSS on RA but session very limited today    Exercises     Assessment/Plan    PT Assessment Patient needs continued PT services  PT Problem  List Decreased strength;Decreased mobility;Decreased safety awareness;Decreased coordination;Decreased activity tolerance;Decreased balance;Cardiopulmonary status limiting activity;Decreased cognition       PT Treatment Interventions Therapeutic activities;Therapeutic exercise;Gait training;Patient/family education;Balance training;Functional mobility training;DME instruction;Cognitive remediation    PT Goals (Current goals can be found in the Care Plan section)  Acute Rehab PT Goals Patient Stated Goal: not stated PT Goal Formulation: Patient unable to participate in goal setting Time For Goal Achievement: 05/26/20 Potential to Achieve Goals: Fair    Frequency Min 2X/week   Barriers to discharge        Co-evaluation               AM-PAC PT "6 Clicks" Mobility  Outcome Measure Help needed turning from your back to your side while in a flat bed without using bedrails?: Total Help needed moving from lying on your back to sitting on the side of a flat bed without using bedrails?: Total Help needed moving to and from a bed to a chair (including a wheelchair)?: Total Help needed standing up from a chair using your arms (e.g., wheelchair or bedside chair)?: Total Help needed to walk in hospital room?: Total Help needed climbing 3-5 steps with a railing? : Total 6 Click Score: 6    End of Session   Activity Tolerance: Patient limited by lethargy Patient left: in bed;with call bell/phone within reach;with bed alarm set Nurse Communication: Mobility status;Other (comment) (IV had come out of his arm at some point in the day) PT Visit Diagnosis: Other abnormalities of gait and mobility (R26.89);Difficulty in walking, not elsewhere classified (R26.2);Muscle weakness (generalized) (M62.81);History of falling (Z91.81)    Time: 1505-1517 PT Time Calculation (min) (ACUTE ONLY): 12 min   Charges:   PT Evaluation $PT Eval Moderate Complexity: 1 Mod           U PT, DPT,  PN1   Supplemental Physical Therapist [place]    Pager [phone] Acute Rehab Office [phone]    

## 2020-05-13 DIAGNOSIS — I10 Essential (primary) hypertension: Secondary | ICD-10-CM | POA: Diagnosis not present

## 2020-05-13 LAB — GLUCOSE, CAPILLARY
Glucose-Capillary: 106 mg/dL — ABNORMAL HIGH (ref 70–99)
Glucose-Capillary: 140 mg/dL — ABNORMAL HIGH (ref 70–99)
Glucose-Capillary: 116 mg/dL — ABNORMAL HIGH (ref 70–99)
Glucose-Capillary: 121 mg/dL — ABNORMAL HIGH (ref 70–99)
Glucose-Capillary: 114 mg/dL — ABNORMAL HIGH (ref 70–99)

## 2020-05-13 LAB — MAGNESIUM
Magnesium: 1.9 mg/dL (ref 1.7–2.4)
Magnesium: 2 mg/dL (ref 1.7–2.4)

## 2020-05-13 LAB — CBC
HCT: 30.5 % — ABNORMAL LOW (ref 39.0–52.0)
Hemoglobin: 10.4 g/dL — ABNORMAL LOW (ref 13.0–17.0)
MCH: 29.8 pg (ref 26.0–34.0)
MCHC: 34.1 g/dL (ref 30.0–36.0)
MCV: 87.4 fL (ref 80.0–100.0)
Platelets: 294 10*3/uL (ref 150–400)
RBC: 3.49 MIL/uL — ABNORMAL LOW (ref 4.22–5.81)
RDW: 11.8 % (ref 11.5–15.5)
WBC: 5.6 10*3/uL (ref 4.0–10.5)
nRBC: 0 % (ref 0.0–0.2)

## 2020-05-13 LAB — BASIC METABOLIC PANEL
Anion gap: 7 (ref 5–15)
BUN: 39 mg/dL — ABNORMAL HIGH (ref 8–23)
CO2: 21 mmol/L — ABNORMAL LOW (ref 22–32)
Calcium: 8.6 mg/dL — ABNORMAL LOW (ref 8.9–10.3)
Chloride: 114 mmol/L — ABNORMAL HIGH (ref 98–111)
Creatinine, Ser: 1.62 mg/dL — ABNORMAL HIGH (ref 0.61–1.24)
GFR, Estimated: 43 mL/min — ABNORMAL LOW (ref >60)
Glucose, Bld: 165 mg/dL — ABNORMAL HIGH (ref 70–99)
Potassium: 3.6 mmol/L (ref 3.5–5.1)
Sodium: 142 mmol/L (ref 135–145)

## 2020-05-13 LAB — PHOSPHORUS
Phosphorus: 2 mg/dL — ABNORMAL LOW (ref 2.5–4.6)
Phosphorus: 2 mg/dL — ABNORMAL LOW (ref 2.5–4.6)

## 2020-05-13 MED ORDER — HYDRALAZINE HCL 50 MG PO TABS
50.0000 mg | ORAL_TABLET | Freq: Three times a day (TID) | ORAL | Status: DC
  Administered 2020-05-13 – 2020-05-14 (×3): 50 mg
  Filled 2020-05-13 (×3): qty 1

## 2020-05-13 NOTE — Progress Notes (Addendum)
PROGRESS NOTE                                                                             PROGRESS NOTE                                                                                                                                                                                                             Patient Demographics:    [name], is a 79 y.o. male, DOB - 08-Feb-1941, ZOX:[id number]  Outpatient Primary MD for the patient is Plotnikov, Georgina Quint, MD    LOS - 3  Admit date - 05/10/2020    Chief Complaint  Patient presents with  . Altered Mental Status       Brief Narrative    79 y.o. male with medical history significant of BPH, venous insufficiency, CKD, Parkinson's dementia, hypertension, falls, neutropenia, thrombocytopenia, recent admission (from 11/10-11/16 due to altered mental status, findings of old CVA, syncope, incidental Covid infection) who presents from his rehab facility with altered mental status.  Patient unable to participate in HPI due to altered mental status, history obtained via chart review and with assistance of family.             His wife states that she spoke with him by phone on Monday and he seemed to be his normal self.  However, when she went to see him at the facility on Wednesday he was no longer opening his eyes or speaking to her.  According to chart review, he was still alert on Tuesday when he was discharged.  Per chart review, facility.  Stated he had been nonresponsive since admission, but this is difficult to confirm.  He has had minimal p.o. intake or medications.  ED Course: Vital signs significant for hypertension to the 170s systolic, otherwise within normal limits.  Labs showed creatinine 2.53 from baseline of 1.5.  Hemoglobin 10.8 baseline of 1.6.  Calcium 8.5 with albumin 2.8.  UA within normal limits, CK within normal limits, RVP with known Covid.  CT  head showed no acute abnormality.   Subjective:     [name] with no significant events overnight as discussed with staff, has been tolerating his tube feed, no fever, no chills .   Assessment  & Plan :    Principal Problem:   Acute encephalopathy Active Problems:   Essential hypertension   Chronic venous insufficiency   Dementia due to Parkinson's disease without behavioral disturbance (HCC)   Parkinson's syndrome (HCC)   History of CVA (cerebrovascular accident)  Acute encephalopathy/ Parkinson's dementia -With underlining Parkinson's dementia, he had recent admission with similar symptoms . -Symptoms appear to be worsened at the facility, he had extensive work-up during recent hospital stay, which did include MRI of the brain which did not show any acute changes, he had stable echo, EEG, carotid ultrasound, TEE, stable A1c, and LDL was around 90, for which he was started on statin and aspirin . - Currently afebrile without other signs of infection, CT head stable, UA with normal limits -Continue to provide supportive care including IV fluids, monitor electrolytes closely and replete as needed. - Avoid benzos and narcotics  -Neurology input greatly appreciated, in the setting of his Parkinson disease and dementia, his cognition status and mentation is expected to fluctuate and to deteriorate over time given it is natural progression of this illness, it is important for him to continue his Parkinson medications, so core track has been inserted, currently on fluids, tube feeds and his medications . -We will follow clinically, at one point he may need to discuss need for PEG tube if mentation does not improve, or he keep having these fluctuation in mentation frequently to ensure compliance with his Parkinson disease medications .   History of CVA - Old infarction noted on previous admission with no acute changes -Continue aspirin,   AKI on CKD 3 -  Creatinine 2.53 (baseline 1.5), in the setting of decreased p.o. intake for the  last day and half or so improving with IV fluids  Hypertension -Initially elevated as he is not able to take his meds, actually now is on the soft side now he is back on his meds, so I will decrease his hydralazine .  Parkinson  disease -Followed by Dr. Karel Jarvis   Hypokalemia -repleted   SpO2: 90 %  Recent Labs  Lab 05/07/20 0106 05/07/20 0106 05/08/20 0127 05/08/20 0127 05/09/20 0447 05/10/20 1715 05/10/20 2150 05/11/20 0316 05/12/20 0124 05/13/20 0746  WBC 4.6   < > 2.7*   < > 3.5* 6.9  --  5.6 5.3 5.6  PLT 96*   < > 105*   < > 134* 174  --  182 229 294  CRP 8.0*  --  9.6*  --  4.3*  --   --  11.5*  --   --   BNP 113.4*  --  116.8*  --  126.2*  --   --   --   --   --   DDIMER 0.60*  --  0.76*  --  0.99*  --   --   --   --   --   AST 14*   < > 16  --  15 18  --  19 17  --   ALT <5   < > <5  --  6 <5  --  6 11  --   ALKPHOS 51   < > 51  --  54 46  --  48 46  --   BILITOT 1.2   < >  1.2  --  1.1 1.1  --  1.0 1.1  --   ALBUMIN 2.6*   < > 2.8*  --  2.8* 2.8*  --  2.7* 2.6*  --   SARSCOV2NAA  --   --   --   --   --   --  POSITIVE*  --   --   --    < > = values in this interval not displayed.       ABG     Component Value Date/Time   TCO2 23 04/22/2011 0342       Condition - Extremely Guarded  Family Communication  : I have D/W wife and daughter [name] [phone]) by phone 05/13/2020   Code Status :  Full  Consults  :  None  Procedures  :  None   Disposition Plan  :    Status is: Inpatient  Remains inpatient appropriate because:IV treatments appropriate due to intensity of illness or inability to take PO   Dispo: The patient is from: SNF              Anticipated d/c is to: SNF              Anticipated d/c date is: 3 days              Patient currently is not medically stable to d/c.      DVT Prophylaxis  :  Lovenox   Lab Results  Component Value Date   PLT 294 05/13/2020    Diet :  Diet Order            Diet NPO time specified   Diet effective now                  Inpatient Medications  Scheduled Meds: . aspirin  81 mg Per Tube Daily  . carbidopa-levodopa  1 tablet Per Tube TID WC  . carvedilol  6.25 mg Per Tube BID WC  . cholecalciferol  1,000 Units Oral Daily  . enoxaparin (LOVENOX) injection  40 mg Subcutaneous Daily  . feeding supplement (PROSource TF)  45 mL Per Tube BID  . free water  155 mL Per Tube Q4H  . hydrALAZINE  50 mg Per Tube Q8H  . rivastigmine  1.5 mg Per Tube BID WC  . sodium chloride flush  3 mL Intravenous Q12H  . vitamin B-12  1,000 mcg Per Tube Daily   Continuous Infusions: . dextrose 5 % and 0.9 % NaCl with KCl 20 mEq/L 75 mL/hr at 05/13/20 1219  . feeding supplement (OSMOLITE 1.5 CAL) 60 mL/hr at 05/13/20 0700   PRN Meds:.hydrALAZINE  Antibiotics  :    Anti-infectives (From admission, onward)   None        Huey Bienenstock M.D on 05/13/2020 at 2:26 PM  To page go to [email or web address]  [facility] -  Office  [phone]     Objective:   Vitals:   05/12/20 2355 05/13/20 0400 05/13/20 0407 05/13/20 0740  BP: (!) 112/57 123/60  (!) 119/52  Pulse: 72 72  80  Resp: Temp: 99 F (37.2 C) 98.7 F (37.1 C)  98 F (36.7 C)  TempSrc: Axillary Axillary  Axillary  SpO2: 99% 100%  90%  Weight:   76.4 kg   Height:        Wt Readings from Last 3 Encounters:  05/13/20 76.4 kg  05/04/20 77.5 kg  04/13/20 77.5 kg  Intake/Output Summary (Last 24 hours) at 05/13/2020 1426 Last data filed at 05/13/2020 1016 Gross per 24 hour  Intake 2542.94 ml  Output 750 ml  Net 1792.94 ml     Physical Exam  Still sleeping comfortably, having his eyes closed all the time, but he is more conversant and appropriate today, answering some questions appropriately . Good air entry bilaterally, no wheezing  Regular rate and rhythm, no rubs or gallops  Abdomen soft, nontender  Extremities with no edema, clubbing or cyanosis.      Data Review:    CBC Recent  Labs  Lab 05/07/20 0106 05/07/20 0106 05/08/20 0127 05/08/20 0127 05/09/20 0447 05/10/20 1715 05/11/20 0316 05/12/20 0124 05/13/20 0746  WBC 4.6   < > 2.7*   < > 3.5* 6.9 5.6 5.3 5.6  HGB 10.4*   < > 11.2*   < > 11.6* 10.8* 10.6* 10.2* 10.4*  HCT 29.7*   < > 31.9*   < > 32.5* 31.2* 30.8* 29.3* 30.5*  PLT 96*   < > 105*   < > 134* 174 182 229 294  MCV 86.3   < > 83.9   < > 82.7 86.7 86.3 85.9 87.4  MCH 30.2   < > 29.5   < > 29.5 30.0 29.7 29.9 29.8  MCHC 35.0   < > 35.1   < > 35.7 34.6 34.4 34.8 34.1  RDW 11.5   < > 11.3*   < > 11.2* 11.7 11.6 11.5 11.8  LYMPHSABS 1.0  --  1.3  --  1.2 1.4  --   --   --   MONOABS 0.4  --  0.3  --  0.4 0.6  --   --   --   EOSABS 0.0  --  0.1  --  0.0 0.0  --   --   --   BASOSABS 0.0  --  0.0  --  0.0 0.0  --   --   --    < > = values in this interval not displayed.    Recent Labs  Lab 05/07/20 0106 05/07/20 0106 05/08/20 0127 05/08/20 0127 05/09/20 0447 05/10/20 1715 05/11/20 0316 05/12/20 0124 05/12/20 0134 05/12/20 1506 05/13/20 0746  NA 139   < > 137   < > 137 136 139 143  --   --  142  K 3.9   < > 3.8   < > 3.4* 3.5 3.3* 3.4*  --   --  3.6  CL 109   < > 105   < > 104 106 108 114*  --   --  114*  CO2 25   < > 24   < > 24 21* 23 20*  --   --  21*  GLUCOSE 101*   < > 104*   < > 103* 117* 90 98  --   --  165*  BUN 17   < > 18   < > 19 36* 36* 33*  --   --  39*  CREATININE 1.70*   < > 1.49*   < > 1.47* 2.53* 2.22* 1.73*  --   --  1.62*  CALCIUM 8.1*   < > 8.6*   < > 8.6* 8.5* 8.7* 8.6*  --   --  8.6*  AST 14*   < > 16  --  15 18 19 17   --   --   --   ALT <5   < > <5  --  6 5 6 11   --   --   --   ALKPHOS 51   < > 51  --  54 46 48 46  --   --   --   BILITOT 1.2   < > 1.2  --  1.1 1.1 1.0 1.1  --   --   --   ALBUMIN 2.6*   < > 2.8*  --  2.8* 2.8* 2.7* 2.6*  --   --   --   MG 1.5*  --  1.5*  --  2.2  --   --   --   --  1.9 1.9  CRP 8.0*  --  9.6*  --  4.3*  --  11.5*  --   --   --   --   DDIMER 0.60*  --  0.76*  --  0.99*  --   --   --    --   --   --   TSH  --   --   --   --   --   --   --   --  1.824  --   --   AMMONIA  --   --   --   --   --   --   --   --  19  --   --   BNP 113.4*  --  116.8*  --  126.2*  --   --   --   --   --   --    < > = values in this interval not displayed.    ------------------------------------------------------------------------------------------------------------------ No results for input(s): CHOL, HDL, LDLCALC, TRIG, CHOLHDL, LDLDIRECT in the last 72 hours.  Lab Results  Component Value Date   HGBA1C 5.4 05/05/2020   ------------------------------------------------------------------------------------------------------------------ Recent Labs    05/12/20 0134  TSH 1.824    Cardiac Enzymes No results for input(s): CKMB, TROPONINI, MYOGLOBIN in the last 168 hours.  Invalid input(s): CK ------------------------------------------------------------------------------------------------------------------    Component Value Date/Time   BNP 126.2 (H) 05/09/2020 0447    Micro Results Recent Results (from the past 240 hour(s))  Culture, blood (single)     Status: None   Collection Time: 05/03/20  4:32 PM   Specimen: BLOOD RIGHT FOREARM  Result Value Ref Range Status   Specimen Description   Final    BLOOD RIGHT FOREARM Performed at [facility], [address], [place], [place] [address]    Special Requests   Final    BOTTLES DRAWN AEROBIC AND ANAEROBIC Blood Culture adequate volume Performed at [facility], [address], [place], [place] [address]    Culture   Final    NO GROWTH 5 DAYS Performed at [facility], [address], [place], [place] [address]    Report Status 05/08/2020 FINAL  Final  Respiratory Panel by RT PCR (Flu A&B, Covid) - Nasopharyngeal Swab     Status: Abnormal   Collection Time: 05/10/20  9:50 PM   Specimen: Nasopharyngeal Swab  Result Value Ref Range Status   SARS Coronavirus 2 by RT PCR POSITIVE (A)  NEGATIVE Final    Comment: RESULT CALLED TO, READ BACK BY AND VERIFIED WITH: Kerrin Champagne RN 05/11/20 AT 0002 SK (NOTE) SARS-CoV-2 target nucleic acids are DETECTED.  SARS-CoV-2 RNA is generally detectable in upper respiratory specimens  during the acute phase of infection. Positive results are indicative of the presence of the identified virus, but do not rule out bacterial  infection or co-infection with other pathogens not detected by the test. Clinical correlation with patient history and other diagnostic information is necessary to determine patient infection status. The expected result is Negative.  Fact Sheet for Patients:  https://www.moore.com/  Fact Sheet for Healthcare Providers: https://www.young.biz/  This test is not yet approved or cleared by the [place] FDA and  has been authorized for detection and/or diagnosis of SARS-CoV-2 by FDA under an Emergency Use Authorization (EUA).  This EUA will remain in effect (meaning this test can be [place] ed) for the duration of  the COVID-19 declaration under Section 564(b)(1) of the Act, 21 U.S.C. section 360bbb-3(b)(1), unless the authorization is terminated or revoked sooner.      Influenza A by PCR NEGATIVE NEGATIVE Final   Influenza B by PCR NEGATIVE NEGATIVE Final    Comment: (NOTE) The Xpert Xpress SARS-CoV-2/FLU/RSV assay is intended as an aid in  the diagnosis of influenza from Nasopharyngeal swab specimens and  should not be used as a sole basis for treatment. Nasal washings and  aspirates are unacceptable for Xpert Xpress SARS-CoV-2/FLU/RSV  testing.  Fact Sheet for Patients: https://www.moore.com/  Fact Sheet for Healthcare Providers: https://www.young.biz/  This test is not yet approved or cleared by the [place] FDA and  has been authorized for detection and/or diagnosis of SARS-CoV-2 by  FDA under an Emergency Use  Authorization (EUA). This EUA will remain  in effect (meaning this test can be used) for the duration of the  Covid-19 declaration under Section 564(b)(1) of the Act, 21  U.S.C. section 360bbb-3(b)(1), unless the authorization is  terminated or revoked. Performed at [facility], [address], [place], [place] [address]     Radiology Reports EEG  Result Date: 05/05/2020 Charlsie Quest, MD     05/05/2020  9:30 AM Patient Name: RAMOND DARNELL MRN: [id number] Epilepsy Attending: Charlsie Quest Referring Physician/Provider: Dr Albertine Grates Date: 05/04/2020 Duration: 23.26 minutes Patient history: 79 year old male with episodes of syncope x2.  EEG to evaluate for seizures. Level of alertness: Awake, drowsy, sleep, comatose, lethargic AEDs during EEG study: None Technical aspects: This EEG study was done with scalp electrodes positioned according to the 10-20 International system of electrode placement. Electrical activity was acquired at a sampling rate of 500Hz  and reviewed with a high frequency filter of 70Hz  and a low frequency filter of 1Hz . EEG data were recorded continuously and digitally stored. Description: The posterior dominant rhythm consists of 8 Hz activity of moderate voltage (25-35 uV) seen predominantly in posterior head regions, symmetric and reactive to eye opening and eye closing. EEG showed continuous generalized 5 to 6 Hz theta as well as intermittent 2 to 3 Hz delta slowing. Hyperventilation and photic stimulation were not performed.   ABNORMALITY -Continuous slow, generalized IMPRESSION: This study is suggestive of mild diffuse encephalopathy, nonspecific etiology. No seizures or epileptiform discharges were seen throughout the recording.   DG Chest 2 View  Result Date: 05/03/2020 CLINICAL DATA:  79 year old male with a history of syncope EXAM: CHEST - 2 VIEW COMPARISON:  07/03/2019 FINDINGS: Cardiomediastinal silhouette likely unchanged in size and  contour given the positioning. No pneumothorax. No pleural effusion. Coarsened interstitial markings bilateral lungs without confluent airspace disease. No acute displaced fracture. Degenerative changes of the visualized spine. IMPRESSION: Negative for acute cardiopulmonary disease Electronically Signed   By: 13/03/2020 D.O.   On: 05/03/2020 13:50   CT Head Wo Contrast  Result Date: 05/10/2020 CLINICAL  DATA:  Mental status changes EXAM: CT HEAD WITHOUT CONTRAST TECHNIQUE: Contiguous axial images were obtained from the base of the skull through the vertex without intravenous contrast. COMPARISON:  05/03/2020 FINDINGS: Brain: There is atrophy and chronic small vessel disease changes. No acute intracranial abnormality. Specifically, no hemorrhage, hydrocephalus, mass lesion, acute infarction, or significant intracranial injury. Vascular: No hyperdense vessel or unexpected calcification. Skull: No acute calvarial abnormality. Sinuses/Orbits: Visualized paranasal sinuses and mastoids clear. Orbital soft tissues unremarkable. Other: None IMPRESSION: Atrophy, chronic microvascular disease. No acute intracranial abnormality. Electronically Signed   By: Charlett Nose M.D.   On: 05/10/2020 19:26   CT Head Wo Contrast  Result Date: 05/03/2020 CLINICAL DATA:  Mental status change, unknown cause. EXAM: CT HEAD WITHOUT CONTRAST TECHNIQUE: Contiguous axial images were obtained from the base of the skull through the vertex without intravenous contrast. COMPARISON:  Head CT 02/16/2019.  Brain MRI 11/23/2018. FINDINGS: Brain: Moderate cerebral atrophy. Moderate ill-defined hypoattenuation within the cerebral white matter is nonspecific, but compatible with chronic small vessel ischemic disease. A small infarct is questioned within the posterior left cerebellar hemisphere, new from the prior head CT of 02/16/2019 but otherwise age indeterminate (series 2, image 9) (series 5, image 56). There is no acute intracranial  hemorrhage. No demarcated cortical infarct. No extra-axial fluid collection. No evidence of intracranial mass. No midline shift. Vascular: No hyperdense vessel.  Atherosclerotic calcifications. Skull: Normal. Negative for fracture or focal lesion. Sinuses/Orbits: Visualized orbits show no acute finding. Mild ethmoid and maxillary sinus mucosal thickening. IMPRESSION: A small infarct is questioned within the posterior left cerebellar hemisphere, new from the prior head CT of 02/16/2019 but otherwise age indeterminate. Moderate cerebral atrophy and chronic small vessel ischemic disease. Mild ethmoid and maxillary sinus mucosal thickening. Electronically Signed   By: Jackey Loge DO   On: 05/03/2020 19:26   MR ANGIO HEAD WO CONTRAST  Result Date: 05/06/2020 CLINICAL DATA:  Posterior cerebellar infarction, age indeterminate. EXAM: MRI HEAD WITHOUT CONTRAST MRA HEAD WITHOUT CONTRAST MRA NECK WITHOUT CONTRAST TECHNIQUE: Multiplanar, multiecho pulse sequences of the brain and surrounding structures were obtained without intravenous contrast. Angiographic images of the Circle of Willis were obtained using MRA technique without intravenous contrast. Angiographic images of the neck were obtained using MRA technique without intravenous contrast. Carotid stenosis measurements (when applicable) are obtained utilizing NASCET criteria, using the distal internal carotid diameter as the denominator. COMPARISON:  Head CT 05/03/2020 FINDINGS: MRI HEAD FINDINGS Brain: Diffusion imaging does not show any acute or subacute infarction. There are old small vessel infarctions within the left posterior cerebellum. Mild chronic small-vessel change affects the pons. Cerebral hemispheres show generalized atrophy, temporal lobe predominant. There are moderate chronic small-vessel ischemic changes of the deep white matter. No large vessel territory cerebral hemispheric infarction. No mass lesion, hemorrhage, hydrocephalus or extra-axial  collection. Vascular: Major vessels at the base of the brain show flow. Skull and upper cervical spine: Negative Sinuses/Orbits: Mild mucosal inflammatory changes of the sinuses. Orbits negative. Other: None MRA HEAD FINDINGS Both internal carotid arteries widely patent through the skull base and siphon regions. No siphon stenosis. The anterior and middle cerebral vessels are patent without proximal stenosis, aneurysm or vascular malformation. No large or medium vessel occlusion. Fenestrated left M1 segment. Both vertebral arteries are patent to the basilar. No basilar stenosis. Posterior circulation branch vessels appear normal. MRA NECK FINDINGS Limited resolution due to noncontrast technique. Both common carotid arteries show antegrade flow to the bifurcation regions. On the right, there is  no apparent stenosis. On the left, there is too much artifact to evaluate the bifurcation region. Antegrade flow is present in both vertebral arteries. IMPRESSION: No acute intracranial finding. 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 Moderate chronic small-vessel ischemic changes of the cerebral hemispheric deep white matter. Generalized atrophy, temporal lobe predominant. 1. MRA head: No large or medium vessel occlusion or correctable proximal stenosis. 2. MRA neck: Allowing for noncontrast technique, there is no visible carotid bifurcation stenosis on the right. Left carotid bifurcation cannot be seen because of artifact. Antegrade flow is present in both vertebral arteries. Electronically Signed   By: Paulina Fusi M.D.   On: 05/06/2020 13:56   MR ANGIO NECK WO CONTRAST  Result Date: 05/06/2020 CLINICAL DATA:  Posterior cerebellar infarction, age indeterminate. EXAM: MRI HEAD WITHOUT CONTRAST MRA HEAD WITHOUT CONTRAST MRA NECK WITHOUT CONTRAST TECHNIQUE: Multiplanar, multiecho pulse sequences of the brain and surrounding structures were obtained without intravenous contrast. Angiographic images of the  Circle of Willis were obtained using MRA technique without intravenous contrast. Angiographic images of the neck were obtained using MRA technique without intravenous contrast. Carotid stenosis measurements (when applicable) are obtained utilizing NASCET criteria, using the distal internal carotid diameter as the denominator. COMPARISON:  Head CT 05/03/2020 FINDINGS: MRI HEAD FINDINGS Brain: Diffusion imaging does not show any acute or subacute infarction. There are old small vessel infarctions within the left posterior cerebellum. Mild chronic small-vessel change affects the pons. Cerebral hemispheres show generalized atrophy, temporal lobe predominant. There are moderate chronic small-vessel ischemic changes of the deep white matter. No large vessel territory cerebral hemispheric infarction. No mass lesion, hemorrhage, hydrocephalus or extra-axial collection. Vascular: Major vessels at the base of the brain show flow. Skull and upper cervical spine: Negative Sinuses/Orbits: Mild mucosal inflammatory changes of the sinuses. Orbits negative. Other: None MRA HEAD FINDINGS Both internal carotid arteries widely patent through the skull base and siphon regions. No siphon stenosis. The anterior and middle cerebral vessels are patent without proximal stenosis, aneurysm or vascular malformation. No large or medium vessel occlusion. Fenestrated left M1 segment. Both vertebral arteries are patent to the basilar. No basilar stenosis. Posterior circulation branch vessels appear normal. MRA NECK FINDINGS Limited resolution due to noncontrast technique. Both common carotid arteries show antegrade flow to the bifurcation regions. On the right, there is no apparent stenosis. On the left, there is too much artifact to evaluate the bifurcation region. Antegrade flow is present in both vertebral arteries. IMPRESSION: No acute intracranial finding. 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 Moderate chronic small-vessel  ischemic changes of the cerebral hemispheric deep white matter. Generalized atrophy, temporal lobe predominant. 1. MRA head: No large or medium vessel occlusion or correctable proximal stenosis. 2. MRA neck: Allowing for noncontrast technique, there is no visible carotid bifurcation stenosis on the right. Left carotid bifurcation cannot be seen because of artifact. Antegrade flow is present in both vertebral arteries. Electronically Signed   By: Paulina Fusi M.D.   On: 05/06/2020 13:56   MR BRAIN WO CONTRAST  Result Date: 05/12/2020 CLINICAL DATA:  Delirium. EXAM: MRI HEAD WITHOUT CONTRAST TECHNIQUE: Multiplanar, multiecho pulse sequences of the brain and surrounding structures were obtained without intravenous contrast. COMPARISON:  Head CT May 10, 2020; MRI of the brain November 13, 20. FINDINGS: Brain: No acute infarction, hemorrhage, extra-axial collection or mass lesion. Scattered and confluent foci of T2 hyperintensity are seen within the white matter of the cerebral hemispheres. Prominence of the supratentorial ventricles and sylvian fissures  with relative preservation of the high convexity sulci, may be seen the setting normal pressure hydrocephalus in the appropriate clinical scenario. Prominent volume loss of the mesial temporal lobes. No significant change from prior MRI. Vascular: Normal flow voids. Skull and upper cervical spine: Normal marrow signal. Sinuses/Orbits: Mucosal thickening the ethmoid cells and sphenoid sinuses. Right lens surgery. Other: Mild bilateral mastoid effusion. IMPRESSION: 1. No acute intracranial abnormality. 2. Prominence of the supratentorial ventricles and Sylvian fissures with relative preservation of the high convexity sulci, may be seen the setting of normal pressure hydrocephalus in the appropriate clinical scenario. 3. Prominent volume loss of the mesial temporal lobes. 4. Moderate chronic small vessel ischemic changes. Electronically Signed   By: Baldemar Lenis M.D.   On: 05/12/2020 00:02   MR BRAIN WO CONTRAST  Result Date: 05/06/2020 CLINICAL DATA:  Posterior cerebellar infarction, age indeterminate. EXAM: MRI HEAD WITHOUT CONTRAST MRA HEAD WITHOUT CONTRAST MRA NECK WITHOUT CONTRAST TECHNIQUE: Multiplanar, multiecho pulse sequences of the brain and surrounding structures were obtained without intravenous contrast. Angiographic images of the Circle of Willis were obtained using MRA technique without intravenous contrast. Angiographic images of the neck were obtained using MRA technique without intravenous contrast. Carotid stenosis measurements (when applicable) are obtained utilizing NASCET criteria, using the distal internal carotid diameter as the denominator. COMPARISON:  Head CT 05/03/2020 FINDINGS: MRI HEAD FINDINGS Brain: Diffusion imaging does not show any acute or subacute infarction. There are old small vessel infarctions within the left posterior cerebellum. Mild chronic small-vessel change affects the pons. Cerebral hemispheres show generalized atrophy, temporal lobe predominant. There are moderate chronic small-vessel ischemic changes of the deep white matter. No large vessel territory cerebral hemispheric infarction. No mass lesion, hemorrhage, hydrocephalus or extra-axial collection. Vascular: Major vessels at the base of the brain show flow. Skull and upper cervical spine: Negative Sinuses/Orbits: Mild mucosal inflammatory changes of the sinuses. Orbits negative. Other: None MRA HEAD FINDINGS Both internal carotid arteries widely patent through the skull base and siphon regions. No siphon stenosis. The anterior and middle cerebral vessels are patent without proximal stenosis, aneurysm or vascular malformation. No large or medium vessel occlusion. Fenestrated left M1 segment. Both vertebral arteries are patent to the basilar. No basilar stenosis. Posterior circulation branch vessels appear normal. MRA NECK FINDINGS Limited resolution  due to noncontrast technique. Both common carotid arteries show antegrade flow to the bifurcation regions. On the right, there is no apparent stenosis. On the left, there is too much artifact to evaluate the bifurcation region. Antegrade flow is present in both vertebral arteries. IMPRESSION: No acute intracranial finding. 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 Moderate chronic small-vessel ischemic changes of the cerebral hemispheric deep white matter. Generalized atrophy, temporal lobe predominant. 1. MRA head: No large or medium vessel occlusion or correctable proximal stenosis. 2. MRA neck: Allowing for noncontrast technique, there is no visible carotid bifurcation stenosis on the right. Left carotid bifurcation cannot be seen because of artifact. Antegrade flow is present in both vertebral arteries. Electronically Signed   By: Paulina Fusi M.D.   On: 05/06/2020 13:56   DG CHEST PORT 1 VIEW  Result Date: 05/11/2020 CLINICAL DATA:  Altered mental status. EXAM: PORTABLE CHEST 1 VIEW COMPARISON:  05/03/2020. FINDINGS: Cardiomegaly. No pulmonary venous congestion. Low lung volumes. No focal infiltrate. No pleural effusion or pneumothorax. Degenerative change thoracic spine. IMPRESSION: 1. Cardiomegaly. No pulmonary venous congestion. 2. Low lung volumes. No focal infiltrate. Electronically Signed   By: Maisie Fus  Register  On: 05/11/2020 07:30   VAS US CAROTID  Result Date: 05/06/2020 Carotid Arterial Duplex Study Indications:       CVA. Risk Factors:      Hypertension. Limitations        Today's exam was limited due to the patient's respiratory                    variation. Comparison Study:  No prior studies. Performing Technologist: Chanda Busing RVT  Examination Guidelines: A complete evaluation includes B-mode imaging, spectral Doppler, color Doppler, and power Doppler as needed of all accessible portions of each vessel. Bilateral testing is considered an integral part of a complete  examination. Limited examinations for reoccurring indications may be performed as noted.  Right Carotid Findings: +----------+--------+--------+--------+-----------------------+--------+           PSV cm/sEDV cm/sStenosisPlaque Description     Comments +----------+--------+--------+--------+-----------------------+--------+ CCA Prox  111     14              smooth and heterogenous         +----------+--------+--------+--------+-----------------------+--------+ CCA Distal78      14              smooth and heterogenous         +----------+--------+--------+--------+-----------------------+--------+ ICA Prox  97      16              smooth and heterogenous         +----------+--------+--------+--------+-----------------------+--------+ ICA Distal51      14                                     tortuous +----------+--------+--------+--------+-----------------------+--------+ ECA       224     26                                              +----------+--------+--------+--------+-----------------------+--------+ +----------+--------+-------+--------+-------------------+           PSV cm/sEDV cmsDescribeArm Pressure (mmHG) +----------+--------+-------+--------+-------------------+ [id number]                                        +----------+--------+-------+--------+-------------------+ +---------+--------+--+--------+--+---------+ VertebralPSV cm/s59EDV cm/s12Antegrade +---------+--------+--+--------+--+---------+  Left Carotid Findings: +----------+--------+--------+--------+-----------------------+--------+           PSV cm/sEDV cm/sStenosisPlaque Description     Comments +----------+--------+--------+--------+-----------------------+--------+ CCA Prox  107     14              smooth and heterogenous         +----------+--------+--------+--------+-----------------------+--------+ CCA Distal111     14              smooth and heterogenous          +----------+--------+--------+--------+-----------------------+--------+ ICA Prox  133     21              smooth and heterogenous         +----------+--------+--------+--------+-----------------------+--------+ ICA Distal88      24                                     tortuous +----------+--------+--------+--------+-----------------------+--------+ ECA  107     9                                               +----------+--------+--------+--------+-----------------------+--------+ +----------+--------+--------+--------+-------------------+           PSV cm/sEDV cm/sDescribeArm Pressure (mmHG) +----------+--------+--------+--------+-------------------+ Subclavian200                                         +----------+--------+--------+--------+-------------------+ +---------+--------+--+--------+--+---------+ VertebralPSV cm/s57EDV cm/s12Antegrade +---------+--------+--+--------+--+---------+   Summary: Right Carotid: Velocities in the right ICA are consistent with a 1-39% stenosis. Left Carotid: Velocities in the left ICA are consistent with a 1-39% stenosis. Vertebrals: Bilateral vertebral arteries demonstrate antegrade flow. *See table(s) above for measurements and observations.  Electronically signed by Fabienne Bruns MD on 05/06/2020 at 11:49:43 AM.    Final    ECHOCARDIOGRAM LIMITED  Result Date: 05/04/2020    ECHOCARDIOGRAM LIMITED REPORT   Patient Name:   AMERICO VALLERY Date of Exam: 05/04/2020 Medical Rec #:  [id number]      Height:       68.0 in Accession #:    [id number]     Weight:       170.9 lb Date of Birth:  [birth year]       BSA:          1.911 m Patient Age:    79 years       BP:           180/84 mmHg Patient Gender: M              HR:           70 bpm. Exam Location:  Inpatient Procedure: Limited Echo, Cardiac Doppler and Color Doppler Indications:    Syncope  History:        Patient has no prior history of Echocardiogram examinations.                  COPD, Signs/Symptoms:Syncope; Risk Factors:Hypertension,                 Dyslipidemia and Former Smoker. Parkinsons Dementia, COVID+.  Sonographer:    Lavenia Atlas Referring Phys: Albertine Grates  Sonographer Comments: Technically challenging study due to limited acoustic windows. Image acquisition challenging due to COPD. IMPRESSIONS  1. Left ventricular ejection fraction, by estimation, is 55 to 60%. The left ventricle has normal function. There is mild left ventricular hypertrophy. Left ventricular diastolic parameters are consistent with Grade I diastolic dysfunction (impaired relaxation).  2. Right ventricular systolic function is normal. The right ventricular size is normal. Tricuspid regurgitation signal is inadequate for assessing PA pressure.  3. Left atrial size was mildly dilated.  4. The mitral valve is normal in structure. No evidence of mitral valve regurgitation. No evidence of mitral stenosis.  5. The aortic valve was not well visualized. Aortic valve regurgitation is trivial. No aortic stenosis is present.  6. Technically difficult study with poor acoustic windows. FINDINGS  Left Ventricle: Left ventricular ejection fraction, by estimation, is 55 to 60%. The left ventricle has normal function. The left ventricular internal cavity size was normal in size. There is mild left ventricular hypertrophy. Left ventricular diastolic  parameters are consistent with Grade I diastolic dysfunction (impaired relaxation). Right Ventricle: The right ventricular size is  normal. No increase in right ventricular wall thickness. Right ventricular systolic function is normal. Tricuspid regurgitation signal is inadequate for assessing PA pressure. Left Atrium: Left atrial size was mildly dilated. Right Atrium: Right atrial size was normal in size. Mitral Valve: The mitral valve is normal in structure. No evidence of mitral valve stenosis. Tricuspid Valve: The tricuspid valve is normal in structure. Tricuspid valve  regurgitation is not demonstrated. Aortic Valve: The aortic valve was not well visualized. Aortic valve regurgitation is trivial. No aortic stenosis is present. Pulmonic Valve: The pulmonic valve was not well visualized. Aorta: The aortic root is normal in size and structure. Venous: The inferior vena cava was not well visualized. IAS/Shunts: No atrial level shunt detected by color flow Doppler. LEFT VENTRICLE PLAX 2D LVIDd:         3.70 cm Diastology LVIDs:         2.60 cm LV e' medial:    4.79 cm/s LV PW:         1.40 cm LV E/e' medial:  9.3 LV IVS:        1.20 cm LV e' lateral:   5.77 cm/s                        LV E/e' lateral: 7.7  RIGHT VENTRICLE RV S prime:     7.07 cm/s MITRAL VALVE MV Area (PHT): 2.22 cm MV Decel Time: 342 msec MV E velocity: 44.60 cm/s MV A velocity: 62.60 cm/s MV E/A ratio:  0.71 Marca Ancona MD Electronically signed by Marca Ancona MD Signature Date/Time: 05/04/2020/3:45:20 PM    Final

## 2020-05-13 NOTE — Progress Notes (Signed)
SLP Cancellation Note  Patient Details Name: [name] MRN: [id number] DOB: 10-Aug-1940   Cancelled treatment:       Reason Eval/Treat Not Completed: Fatigue/lethargy limiting ability to participate; nursing stated he has been lethargic majority of day and is receiving nutritional support via [employer].   Tressie Stalker, M.S., CCC-SLP 05/13/2020, 1:05 PM

## 2020-05-13 NOTE — Evaluation (Signed)
Occupational Therapy Evaluation Patient Details Name: [name] MRN: [id number] DOB: September 17, 1940 Today's Date: 05/13/2020    History of Present Illness 79yo male with recent admit 11/10 to 11/16 due to AMS/syncope/incidental Covid, now returning to the ED from his SNF with AMS and non-responsiveness. Admitted with acute encephalopathy and parkinsons dementia in the context of Covid. PMH HLD, HTN, parasomnia, CKD, parkinsons dementia, falls   Clinical Impression   PTA, pt was at SNF and required assistance for ADLs and used RW for mobility; information from chart review as pt poor historian. Pt currently requiring Total A for ADLs and bed mobility. Pt with lethargy and decreased arousal; benefiting from upright posture. Requiring Total A for repositioning in bed to optimize upright posture; placing bed into chair position. Pt requiring Max-Total A for participating in grooming task. Facilitating AAROM of BUEs; noting edema at LUE and RN aware. Pt would benefit from further acute OT to facilitate safe dc. Recommend dc to SNF for further OT to optimize safety, independence with ADLs, and return to PLOF.   HR 60. SpO2 100% on RA.    Follow Up Recommendations  SNF    Equipment Recommendations  None recommended by OT    Recommendations for Other Services PT consult     Precautions / Restrictions Precautions Precautions: Fall      Mobility Bed Mobility Overal bed mobility: Needs Assistance             General bed mobility comments: Total A to reposition in bed; optimize upright posture    Transfers                 General transfer comment: Defer for safety    Balance                                           ADL either performed or assessed with clinical judgement   ADL Overall ADL's : Needs assistance/impaired Eating/Feeding: NPO   Grooming: Bed level;Maximal assistance;Wash/dry face;Oral care;Total assistance Grooming Details (indicate cue  type and reason): Total A for washing pt's face; placing wash cloth in pt's hand and bringing hadn to face, but pt not engaging. Pt grasping tooth brush and attempting to brush back and forth with Max A to support at elbow and wrist.                                General ADL Comments: Max-Total A for ADLsand bed mobility. Not attempting OOB activity at this time due to safety as pt with decreased arousal and strength.      Vision Baseline Vision/History: Wears glasses Wears Glasses: At all times Vision Assessment?: No apparent visual deficits     Perception     Praxis      Pertinent Vitals/Pain Pain Assessment: Faces Faces Pain Scale: No hurt Pain Intervention(s): Monitored during session     Hand Dominance Right   Extremity/Trunk Assessment Upper Extremity Assessment Upper Extremity Assessment: RUE deficits/detail;LUE deficits/detail RUE Deficits / Details: Gross weakness, difficulty performing shoudler flexion. Weakness L>R. Able to grasp tooth brush when attempting to brush teeth.  RUE Coordination: decreased gross motor LUE Deficits / Details: Gross weakness, difficulty performing shoudler flexion. Weakness L>R. Poor grasp strength. Significant edema.  LUE Coordination: decreased fine motor;decreased gross motor   Lower Extremity Assessment Lower Extremity Assessment:  Defer to PT evaluation   Cervical / Trunk Assessment Cervical / Trunk Assessment: Kyphotic   Communication Communication Communication: Expressive difficulties   Cognition Arousal/Alertness: Lethargic Behavior During Therapy: Flat affect Overall Cognitive Status: Difficult to assess                                 General Comments: Pt participating in simple conversation. Benefiting from upright posture to increase arousal. With physical support, pt attempting to brush his teeth; diffixculty sequencing back and forth motion. Poor orientation.   General Comments  HR 60s. SpO2  100% on RA    Exercises Exercises: General Upper Extremity General Exercises - Upper Extremity Shoulder Flexion: AAROM;Both;10 reps;Seated (In bed) Elbow Flexion: AAROM;Both;10 reps;Seated (In bed) Elbow Extension: AAROM;Both;10 reps;Seated (In bed) Wrist Flexion: AAROM;Both;10 reps;Seated (In bed) Wrist Extension: AAROM;Both;10 reps;Seated (In bed) Digit Composite Flexion: AAROM;Both;10 reps;Seated (In bed) Composite Extension: AAROM;Both;10 reps;Seated (In bed)   Shoulder Instructions      Home Living Family/patient expects to be discharged to:: Skilled nursing facility Living Arrangements: Other (Comment)                               Additional Comments: Pt unable to provide information about PLOF and support      Prior Functioning/Environment Level of Independence: Needs assistance  Gait / Transfers Assistance Needed: Per chart has had recent falls at home. Per RN, wife reports pt ambulatory in the house. ADL's / Homemaking Assistance Needed: Needs assistance for spouse - unknown how much assistance.   Comments: patient unable to provide history- information taken from prior charting        OT Problem List: Decreased activity tolerance;Impaired balance (sitting and/or standing);Decreased safety awareness;Decreased cognition;Decreased knowledge of use of DME or AE      OT Treatment/Interventions: Self-care/ADL training;Therapeutic exercise;DME and/or AE instruction;Therapeutic activities;Patient/family education;Balance training    OT Goals(Current goals can be found in the care plan section) Acute Rehab OT Goals Patient Stated Goal: not stated OT Goal Formulation: Patient unable to participate in goal setting Time For Goal Achievement: 05/27/20 Potential to Achieve Goals: Fair  OT Frequency: Min 2X/week   Barriers to D/C:            Co-evaluation              AM-PAC OT "6 Clicks" Daily Activity     Outcome Measure Help from another person  eating meals?: Total Help from another person taking care of personal grooming?: A Lot Help from another person toileting, which includes using toliet, bedpan, or urinal?: Total Help from another person bathing (including washing, rinsing, drying)?: Total Help from another person to put on and taking off regular upper body clothing?: Total Help from another person to put on and taking off regular lower body clothing?: Total 6 Click Score: 7   End of Session Nurse Communication: Mobility status (yonker for increase saliva)  Activity Tolerance: Patient limited by fatigue;Patient limited by lethargy Patient left: with call bell/phone within reach;in bed;with bed alarm set;with nursing/sitter in room  OT Visit Diagnosis: Unsteadiness on feet (R26.81)                Time: 4132-4401 OT Time Calculation (min): 28 min Charges:  OT General Charges $OT Visit: 1 Visit OT Evaluation $OT Eval Moderate Complexity: 1 Mod OT Treatments $Self Care/Home Management : 8-22 mins  Tatsuo Musial  Klay Sobotka MSOT, OTR/L Acute Rehab Pager: [phone] Office: [phone]  Theodoro Grist Lena Fieldhouse 05/13/2020, 6:37 PM

## 2020-05-14 ENCOUNTER — Inpatient Hospital Stay (HOSPITAL_COMMUNITY): Payer: Medicare Other

## 2020-05-14 DIAGNOSIS — I10 Essential (primary) hypertension: Secondary | ICD-10-CM | POA: Diagnosis not present

## 2020-05-14 LAB — COMPREHENSIVE METABOLIC PANEL
ALT: 11 U/L (ref 0–44)
AST: 13 U/L — ABNORMAL LOW (ref 15–41)
Albumin: 2.4 g/dL — ABNORMAL LOW (ref 3.5–5.0)
Alkaline Phosphatase: 43 U/L (ref 38–126)
Anion gap: 7 (ref 5–15)
BUN: 38 mg/dL — ABNORMAL HIGH (ref 8–23)
CO2: 20 mmol/L — ABNORMAL LOW (ref 22–32)
Calcium: 8.5 mg/dL — ABNORMAL LOW (ref 8.9–10.3)
Chloride: 114 mmol/L — ABNORMAL HIGH (ref 98–111)
Creatinine, Ser: 1.54 mg/dL — ABNORMAL HIGH (ref 0.61–1.24)
GFR, Estimated: 45 mL/min — ABNORMAL LOW (ref >60)
Glucose, Bld: 139 mg/dL — ABNORMAL HIGH (ref 70–99)
Potassium: 4 mmol/L (ref 3.5–5.1)
Sodium: 141 mmol/L (ref 135–145)
Total Bilirubin: 0.6 mg/dL (ref 0.3–1.2)
Total Protein: 5 g/dL — ABNORMAL LOW (ref 6.5–8.1)

## 2020-05-14 LAB — GLUCOSE, CAPILLARY
Glucose-Capillary: 118 mg/dL — ABNORMAL HIGH (ref 70–99)
Glucose-Capillary: 121 mg/dL — ABNORMAL HIGH (ref 70–99)
Glucose-Capillary: 121 mg/dL — ABNORMAL HIGH (ref 70–99)
Glucose-Capillary: 121 mg/dL — ABNORMAL HIGH (ref 70–99)
Glucose-Capillary: 124 mg/dL — ABNORMAL HIGH (ref 70–99)
Glucose-Capillary: 131 mg/dL — ABNORMAL HIGH (ref 70–99)

## 2020-05-14 LAB — CBC
HCT: 30.7 % — ABNORMAL LOW (ref 39.0–52.0)
Hemoglobin: 10.8 g/dL — ABNORMAL LOW (ref 13.0–17.0)
MCH: 31.1 pg (ref 26.0–34.0)
MCHC: 35.2 g/dL (ref 30.0–36.0)
MCV: 88.5 fL (ref 80.0–100.0)
Platelets: 360 10*3/uL (ref 150–400)
RBC: 3.47 MIL/uL — ABNORMAL LOW (ref 4.22–5.81)
RDW: 11.9 % (ref 11.5–15.5)
WBC: 5.9 10*3/uL (ref 4.0–10.5)
nRBC: 0 % (ref 0.0–0.2)

## 2020-05-14 LAB — PHOSPHORUS: Phosphorus: 2.4 mg/dL — ABNORMAL LOW (ref 2.5–4.6)

## 2020-05-14 MED ORDER — ACETAMINOPHEN 160 MG/5ML PO SOLN: 650.0000 mg | Freq: Four times a day (QID) | ORAL | Status: DC | PRN

## 2020-05-14 MED ORDER — ACETAMINOPHEN 325 MG PO TABS
650.0000 mg | ORAL_TABLET | Freq: Four times a day (QID) | ORAL | Status: DC | PRN
  Administered 2020-05-14: 650 mg via ORAL
  Filled 2020-05-14: qty 2

## 2020-05-14 MED ORDER — VITAMIN D 25 MCG (1000 UNIT) PO TABS
1000.0000 [IU] | ORAL_TABLET | Freq: Every day | ORAL | Status: DC
  Administered 2020-05-15 – 2020-05-30 (×16): 1000 [IU]
  Filled 2020-05-14 (×16): qty 1

## 2020-05-14 NOTE — Progress Notes (Addendum)
PROGRESS NOTE                                                                             PROGRESS NOTE                                                                                                                                                                                                             Patient Demographics:    [name], is a 79 y.o. male, DOB - 07/14/1940, NUU:[id number]  Outpatient Primary MD for the patient is [name], Larry Quint, MD    LOS - 4  Admit date - 05/10/2020    Chief Complaint  Patient presents with  . Altered Mental Status       Brief Narrative    79 y.o. male with medical history significant of BPH, venous insufficiency, CKD, Parkinson's dementia, hypertension, falls, neutropenia, thrombocytopenia, recent admission (from 11/10-11/16 due to altered mental status, findings of old CVA, syncope, incidental Covid infection) who presents from his rehab facility with altered mental status.  Patient unable to participate in HPI due to altered mental status, history obtained via chart review and with assistance of family.             His wife states that she spoke with him by phone on Monday and he seemed to be his normal self.  However, when she went to see him at the facility on Wednesday he was no longer opening his eyes or speaking to her.  According to chart review, he was still alert on Tuesday when he was discharged.  Per chart review, facility.  Stated he had been nonresponsive since admission, but this is difficult to confirm.  He has had minimal p.o. intake or medications.  ED Course: Vital signs significant for hypertension to the 170s systolic, otherwise within normal limits.  Labs showed creatinine 2.53 from baseline of 1.5.  Hemoglobin 10.8 baseline of 1.6.  Calcium 8.5 with albumin 2.8.  UA within normal limits, CK within normal limits, RVP with known Covid.  CT  head showed no acute abnormality.   Subjective:     [name] with no significant events overnight as discussed with staff, he has been tolerating his tube feeds, he is more interactive.     Assessment  & Plan :    Principal Problem:   Acute encephalopathy Active Problems:   Essential hypertension   Chronic venous insufficiency   Dementia due to Parkinson's disease without behavioral disturbance (HCC)   Parkinson's syndrome (HCC)   History of CVA (cerebrovascular accident)  Acute encephalopathy/ Parkinson's dementia -With underlining Parkinson's dementia, he had recent admission with similar symptoms . -Symptoms appear to be worsened at the facility, he had extensive work-up during recent hospital stay, which did include MRI of the brain which did not show any acute changes, he had stable echo, EEG, carotid ultrasound, TEE, stable A1c, and LDL was around 90, for which he was started on statin and aspirin . - Currently afebrile without other signs of infection, CT head stable, UA with normal limits -Continue to provide supportive care including IV fluids, monitor electrolytes closely and replete as needed. - Avoid benzos and narcotics  -Neurology input greatly appreciated, in the setting of his Parkinson disease and dementia, his cognition status and mentation is expected to fluctuate and to deteriorate over time given it is natural progression of this illness, it is important for him to continue his Parkinson medications, so core track has been inserted, currently on fluids, tube feeds and his medications . -We will follow clinically, at one point he may need to discuss need for PEG tube if mentation does not improve, or he keep having these fluctuation in mentation frequently to ensure compliance with his Parkinson disease medications .  We will follow we will continue with current measures including cube feeds, meds through his core track tube, as he does appear to be improving, hopefully in 24 to 48 hours can have SLP evaluate him to  see if it is appropriate for him to start having oral intake.  COVID-19 19 infection -He is fully vaccinated, this is incidental finding on recent hospitalization on 11/10, asymptomatic, received monoclonal antibody 11/11, he will need 10 days isolation, tomorrow this can be discontinued,  History of CVA - Old infarction noted on previous admission with no acute changes -Continue aspirin,   AKI on CKD 3 -  Creatinine 2.53 (baseline 1.5), in the setting of decreased p.o. intake for the last day and half or so improving with IV fluids  Hypertension -Initially elevated as he is not able to take his meds, actually it is now on the lower side, so we will continue with Coreg but I will stop his hydralazine.    Parkinson  disease -Followed by Dr. Karel Jarvis   Hypokalemia -repleted   We will check venous Dopplers for left upper extremity edema.  SpO2: 100 %  Recent Labs  Lab 05/08/20 0127 05/08/20 0127 05/09/20 0447 05/09/20 0447 05/10/20 1715 05/10/20 2150 05/11/20 0316 05/12/20 0124 05/13/20 0746 05/14/20 0847  WBC 2.7*   < > 3.5*   < > 6.9  --  5.6 5.3 5.6 5.9  PLT 105*   < > 134*   < > 174  --  182 229 294 360  CRP 9.6*  --  4.3*  --   --   --  11.5*  --   --   --   BNP 116.8*  --  126.2*  --   --   --   --   --   --   --  DDIMER 0.76*  --  0.99*  --   --   --   --   --   --   --   AST 16   < > 15  --  18  --  19 17  --  13*  ALT <5   < > 6  --  <5  --  6 11  --  11  ALKPHOS 51   < > 54  --  46  --  48 46  --  43  BILITOT 1.2   < > 1.1  --  1.1  --  1.0 1.1  --  0.6  ALBUMIN 2.8*   < > 2.8*  --  2.8*  --  2.7* 2.6*  --  2.4*  SARSCOV2NAA  --   --   --   --   --  POSITIVE*  --   --   --   --    < > = values in this interval not displayed.       ABG     Component Value Date/Time   TCO2 23 04/22/2011 0342       Condition - Extremely Guarded  Family Communication  : I have D/W wife and daughter Nonda Lou [phone]) by phone 05/13/2020   Code Status :   Full  Consults  :  None  Procedures  :  None   Disposition Plan  :    Status is: Inpatient  Remains inpatient appropriate because:IV treatments appropriate due to intensity of illness or inability to take PO   Dispo: The patient is from: SNF              Anticipated d/c is to: SNF              Anticipated d/c date is: 3 days              Patient currently is not medically stable to d/c.      DVT Prophylaxis  :  Lovenox   Lab Results  Component Value Date   PLT 360 05/14/2020    Diet :  Diet Order            Diet NPO time specified  Diet effective now                  Inpatient Medications  Scheduled Meds: . aspirin  81 mg Per Tube Daily  . carbidopa-levodopa  1 tablet Per Tube TID WC  . carvedilol  6.25 mg Per Tube BID WC  . cholecalciferol  1,000 Units Oral Daily  . enoxaparin (LOVENOX) injection  40 mg Subcutaneous Daily  . feeding supplement (PROSource TF)  45 mL Per Tube BID  . free water  155 mL Per Tube Q4H  . hydrALAZINE  50 mg Per Tube Q8H  . rivastigmine  1.5 mg Per Tube BID WC  . sodium chloride flush  3 mL Intravenous Q12H  . vitamin B-12  1,000 mcg Per Tube Daily   Continuous Infusions: . dextrose 5 % and 0.9 % NaCl with KCl 20 mEq/L 50 mL/hr at 05/14/20 1147  . feeding supplement (OSMOLITE 1.5 CAL) 1,000 mL (05/13/20 2156)   PRN Meds:.hydrALAZINE  Antibiotics  :    Anti-infectives (From admission, onward)   None        Huey Bienenstock M.D on 05/14/2020 at 12:06 PM  To page go to [email or web address]  [facility] -  Office  [phone]     Objective:  Vitals:   05/13/20 2000 05/14/20 0000 05/14/20 0356 05/14/20 1042  BP: (!) 131/59 (!) 112/50 128/64 129/74  Pulse: 72 62 71 76  Resp: 15 13 15 18   Temp: 97.6 F (36.4 C) 98.8 F (37.1 C) 97.9 F (36.6 C) 98.7 F (37.1 C)  TempSrc: Oral  Oral Axillary  SpO2: 100% 100% 100% 100%  Weight:      Height:        Wt Readings from Last 3 Encounters:  05/13/20 76.4 kg    05/04/20 77.5 kg  04/13/20 77.5 kg     Intake/Output Summary (Last 24 hours) at 05/14/2020 1206 Last data filed at 05/14/2020 0600 Gross per 24 hour  Intake 4178.21 ml  Output 600 ml  Net 3578.21 ml     Physical Exam  Patient having his eye closed, but he is more interactive, answering more questions today and follow some commands  Good air entry bilaterally, no wheezing  Regular rate and rhythm, no rubs or gallops  Abdomen soft, nontender, bowel sounds present  Extremities with no clubbing or cyanosis, he is still having some left upper extremity edema .     Data Review:    CBC Recent Labs  Lab 05/08/20 0127 05/08/20 0127 05/09/20 0447 05/09/20 0447 05/10/20 1715 05/11/20 0316 05/12/20 0124 05/13/20 0746 05/14/20 0847  WBC 2.7*   < > 3.5*   < > 6.9 5.6 5.3 5.6 5.9  HGB 11.2*   < > 11.6*   < > 10.8* 10.6* 10.2* 10.4* 10.8*  HCT 31.9*   < > 32.5*   < > 31.2* 30.8* 29.3* 30.5* 30.7*  PLT 105*   < > 134*   < > 174 182 229 294 360  MCV 83.9   < > 82.7   < > 86.7 86.3 85.9 87.4 88.5  MCH 29.5   < > 29.5   < > 30.0 29.7 29.9 29.8 31.1  MCHC 35.1   < > 35.7   < > 34.6 34.4 34.8 34.1 35.2  RDW 11.3*   < > 11.2*   < > 11.7 11.6 11.5 11.8 11.9  LYMPHSABS 1.3  --  1.2  --  1.4  --   --   --   --   MONOABS 0.3  --  0.4  --  0.6  --   --   --   --   EOSABS 0.1  --  0.0  --  0.0  --   --   --   --   BASOSABS 0.0  --  0.0  --  0.0  --   --   --   --    < > = values in this interval not displayed.    Recent Labs  Lab 05/08/20 0127 05/08/20 0127 05/09/20 0447 05/09/20 0447 05/10/20 1715 05/11/20 0316 05/12/20 0124 05/12/20 0134 05/12/20 1506 05/13/20 0746 05/13/20 1828 05/14/20 0847  NA 137   < > 137   < > 136 139 143  --   --  142  --  141  K 3.8   < > 3.4*   < > 3.5 3.3* 3.4*  --   --  3.6  --  4.0  CL 105   < > 104   < > 106 108 114*  --   --  114*  --  114*  CO2 24   < > 24   < > 21* 23 20*  --   --  21*  --  20*  GLUCOSE 104*   < > 103*   < > 117* 90 98   --   --  165*  --  139*  BUN 18   < > 19   < > 36* 36* 33*  --   --  39*  --  38*  CREATININE 1.49*   < > 1.47*   < > 2.53* 2.22* 1.73*  --   --  1.62*  --  1.54*  CALCIUM 8.6*   < > 8.6*   < > 8.5* 8.7* 8.6*  --   --  8.6*  --  8.5*  AST 16   < > 15  --  18 19 17   --   --   --   --  13*  ALT <5   < > 6  --  5 6 11   --   --   --   --  11  ALKPHOS 51   < > 54  --  46 48 46  --   --   --   --  43  BILITOT 1.2   < > 1.1  --  1.1 1.0 1.1  --   --   --   --  0.6  ALBUMIN 2.8*   < > 2.8*  --  2.8* 2.7* 2.6*  --   --   --   --  2.4*  MG 1.5*  --  2.2  --   --   --   --   --  1.9 1.9 2.0  --   CRP 9.6*  --  4.3*  --   --  11.5*  --   --   --   --   --   --   DDIMER 0.76*  --  0.99*  --   --   --   --   --   --   --   --   --   TSH  --   --   --   --   --   --   --  1.824  --   --   --   --   AMMONIA  --   --   --   --   --   --   --  19  --   --   --   --   BNP 116.8*  --  126.2*  --   --   --   --   --   --   --   --   --    < > = values in this interval not displayed.    ------------------------------------------------------------------------------------------------------------------ No results for input(s): CHOL, HDL, LDLCALC, TRIG, CHOLHDL, LDLDIRECT in the last 72 hours.  Lab Results  Component Value Date   HGBA1C 5.4 05/05/2020   ------------------------------------------------------------------------------------------------------------------ Recent Labs    05/12/20 0134  TSH 1.824    Cardiac Enzymes No results for input(s): CKMB, TROPONINI, MYOGLOBIN in the last 168 hours.  Invalid input(s): CK ------------------------------------------------------------------------------------------------------------------    Component Value Date/Time   BNP 126.2 (H) 05/09/2020 0447    Micro Results Recent Results (from the past 240 hour(s))  Respiratory Panel by RT PCR (Flu A&B, Covid) - Nasopharyngeal Swab     Status: Abnormal   Collection Time: 05/10/20  9:50 PM   Specimen:  Nasopharyngeal Swab  Result Value Ref Range Status   SARS Coronavirus 2 by RT PCR POSITIVE (A) NEGATIVE Final    Comment: RESULT CALLED TO,  READ BACK BY AND VERIFIED WITH: Kerrin Champagne RN 05/11/20 AT 0002 SK (NOTE) SARS-CoV-2 target nucleic acids are DETECTED.  SARS-CoV-2 RNA is generally detectable in upper respiratory specimens  during the acute phase of infection. Positive results are indicative of the presence of the identified virus, but do not rule out bacterial infection or co-infection with other pathogens not detected by the test. Clinical correlation with patient history and other diagnostic information is necessary to determine patient infection status. The expected result is Negative.  Fact Sheet for Patients:  https://www.moore.com/  Fact Sheet for Healthcare Providers: https://www.young.biz/  This test is not yet approved or cleared by the [place] FDA and  has been authorized for detection and/or diagnosis of SARS-CoV-2 by FDA under an Emergency Use Authorization (EUA).  This EUA will remain in effect (meaning this test can be [place] ed) for the duration of  the COVID-19 declaration under Section 564(b)(1) of the Act, 21 U.S.C. section 360bbb-3(b)(1), unless the authorization is terminated or revoked sooner.      Influenza A by PCR NEGATIVE NEGATIVE Final   Influenza B by PCR NEGATIVE NEGATIVE Final    Comment: (NOTE) The Xpert Xpress SARS-CoV-2/FLU/RSV assay is intended as an aid in  the diagnosis of influenza from Nasopharyngeal swab specimens and  should not be used as a sole basis for treatment. Nasal washings and  aspirates are unacceptable for Xpert Xpress SARS-CoV-2/FLU/RSV  testing.  Fact Sheet for Patients: https://www.moore.com/  Fact Sheet for Healthcare Providers: https://www.young.biz/  This test is not yet approved or cleared by the [place] FDA and  has been  authorized for detection and/or diagnosis of SARS-CoV-2 by  FDA under an Emergency Use Authorization (EUA). This EUA will remain  in effect (meaning this test can be used) for the duration of the  Covid-19 declaration under Section 564(b)(1) of the Act, 21  U.S.C. section 360bbb-3(b)(1), unless the authorization is  terminated or revoked. Performed at [facility], [address] [place], [place] [address]     Radiology Reports EEG  Result Date: 05/05/2020 Charlsie Quest, MD     05/05/2020  9:30 AM Patient Name: BROADUS COSTILLA MRN: [id number] Epilepsy Attending: Charlsie Quest Referring Physician/Provider: Dr Albertine Grates Date: 05/04/2020 Duration: 23.26 minutes Patient history: 79 year old male with episodes of syncope x2.  EEG to evaluate for seizures. Level of alertness: Awake, drowsy, sleep, comatose, lethargic AEDs during EEG study: None Technical aspects: This EEG study was done with scalp electrodes positioned according to the 10-20 International system of electrode placement. Electrical activity was acquired at a sampling rate of 500Hz  and reviewed with a high frequency filter of 70Hz  and a low frequency filter of 1Hz . EEG data were recorded continuously and digitally stored. Description: The posterior dominant rhythm consists of 8 Hz activity of moderate voltage (25-35 uV) seen predominantly in posterior head regions, symmetric and reactive to eye opening and eye closing. EEG showed continuous generalized 5 to 6 Hz theta as well as intermittent 2 to 3 Hz delta slowing. Hyperventilation and photic stimulation were not performed.   ABNORMALITY -Continuous slow, generalized IMPRESSION: This study is suggestive of mild diffuse encephalopathy, nonspecific etiology. No seizures or epileptiform discharges were seen throughout the recording.   DG Chest 2 View  Result Date: 05/03/2020 CLINICAL DATA:  79 year old male with a history of syncope EXAM: CHEST - 2 VIEW  COMPARISON:  07/03/2019 FINDINGS: Cardiomediastinal silhouette likely unchanged in size and contour given the positioning. No  pneumothorax. No pleural effusion. Coarsened interstitial markings bilateral lungs without confluent airspace disease. No acute displaced fracture. Degenerative changes of the visualized spine. IMPRESSION: Negative for acute cardiopulmonary disease Electronically Signed   By: Gilmer Mor D.O.   On: 05/03/2020 13:50   CT Head Wo Contrast  Result Date: 05/10/2020 CLINICAL DATA:  Mental status changes EXAM: CT HEAD WITHOUT CONTRAST TECHNIQUE: Contiguous axial images were obtained from the base of the skull through the vertex without intravenous contrast. COMPARISON:  05/03/2020 FINDINGS: Brain: There is atrophy and chronic small vessel disease changes. No acute intracranial abnormality. Specifically, no hemorrhage, hydrocephalus, mass lesion, acute infarction, or significant intracranial injury. Vascular: No hyperdense vessel or unexpected calcification. Skull: No acute calvarial abnormality. Sinuses/Orbits: Visualized paranasal sinuses and mastoids clear. Orbital soft tissues unremarkable. Other: None IMPRESSION: Atrophy, chronic microvascular disease. No acute intracranial abnormality. Electronically Signed   By: Charlett Nose M.D.   On: 05/10/2020 19:26   CT Head Wo Contrast  Result Date: 05/03/2020 CLINICAL DATA:  Mental status change, unknown cause. EXAM: CT HEAD WITHOUT CONTRAST TECHNIQUE: Contiguous axial images were obtained from the base of the skull through the vertex without intravenous contrast. COMPARISON:  Head CT 02/16/2019.  Brain MRI 11/23/2018. FINDINGS: Brain: Moderate cerebral atrophy. Moderate ill-defined hypoattenuation within the cerebral white matter is nonspecific, but compatible with chronic small vessel ischemic disease. A small infarct is questioned within the posterior left cerebellar hemisphere, new from the prior head CT of 02/16/2019 but otherwise age  indeterminate (series 2, image 9) (series 5, image 56). There is no acute intracranial hemorrhage. No demarcated cortical infarct. No extra-axial fluid collection. No evidence of intracranial mass. No midline shift. Vascular: No hyperdense vessel.  Atherosclerotic calcifications. Skull: Normal. Negative for fracture or focal lesion. Sinuses/Orbits: Visualized orbits show no acute finding. Mild ethmoid and maxillary sinus mucosal thickening. IMPRESSION: A small infarct is questioned within the posterior left cerebellar hemisphere, new from the prior head CT of 02/16/2019 but otherwise age indeterminate. Moderate cerebral atrophy and chronic small vessel ischemic disease. Mild ethmoid and maxillary sinus mucosal thickening. Electronically Signed   By: Jackey Loge DO   On: 05/03/2020 19:26   MR ANGIO HEAD WO CONTRAST  Result Date: 05/06/2020 CLINICAL DATA:  Posterior cerebellar infarction, age indeterminate. EXAM: MRI HEAD WITHOUT CONTRAST MRA HEAD WITHOUT CONTRAST MRA NECK WITHOUT CONTRAST TECHNIQUE: Multiplanar, multiecho pulse sequences of the brain and surrounding structures were obtained without intravenous contrast. Angiographic images of the Circle of Willis were obtained using MRA technique without intravenous contrast. Angiographic images of the neck were obtained using MRA technique without intravenous contrast. Carotid stenosis measurements (when applicable) are obtained utilizing NASCET criteria, using the distal internal carotid diameter as the denominator. COMPARISON:  Head CT 05/03/2020 FINDINGS: MRI HEAD FINDINGS Brain: Diffusion imaging does not show any acute or subacute infarction. There are old small vessel infarctions within the left posterior cerebellum. Mild chronic small-vessel change affects the pons. Cerebral hemispheres show generalized atrophy, temporal lobe predominant. There are moderate chronic small-vessel ischemic changes of the deep white matter. No large vessel territory  cerebral hemispheric infarction. No mass lesion, hemorrhage, hydrocephalus or extra-axial collection. Vascular: Major vessels at the base of the brain show flow. Skull and upper cervical spine: Negative Sinuses/Orbits: Mild mucosal inflammatory changes of the sinuses. Orbits negative. Other: None MRA HEAD FINDINGS Both internal carotid arteries widely patent through the skull base and siphon regions. No siphon stenosis. The anterior and middle cerebral vessels are patent without proximal stenosis, aneurysm or  vascular malformation. No large or medium vessel occlusion. Fenestrated left M1 segment. Both vertebral arteries are patent to the basilar. No basilar stenosis. Posterior circulation branch vessels appear normal. MRA NECK FINDINGS Limited resolution due to noncontrast technique. Both common carotid arteries show antegrade flow to the bifurcation regions. On the right, there is no apparent stenosis. On the left, there is too much artifact to evaluate the bifurcation region. Antegrade flow is present in both vertebral arteries. IMPRESSION: No acute intracranial finding. 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 Moderate chronic small-vessel ischemic changes of the cerebral hemispheric deep white matter. Generalized atrophy, temporal lobe predominant. 1. MRA head: No large or medium vessel occlusion or correctable proximal stenosis. 2. MRA neck: Allowing for noncontrast technique, there is no visible carotid bifurcation stenosis on the right. Left carotid bifurcation cannot be seen because of artifact. Antegrade flow is present in both vertebral arteries. Electronically Signed   By: Paulina Fusi M.D.   On: 05/06/2020 13:56   MR ANGIO NECK WO CONTRAST  Result Date: 05/06/2020 CLINICAL DATA:  Posterior cerebellar infarction, age indeterminate. EXAM: MRI HEAD WITHOUT CONTRAST MRA HEAD WITHOUT CONTRAST MRA NECK WITHOUT CONTRAST TECHNIQUE: Multiplanar, multiecho pulse sequences of the brain and  surrounding structures were obtained without intravenous contrast. Angiographic images of the Circle of Willis were obtained using MRA technique without intravenous contrast. Angiographic images of the neck were obtained using MRA technique without intravenous contrast. Carotid stenosis measurements (when applicable) are obtained utilizing NASCET criteria, using the distal internal carotid diameter as the denominator. COMPARISON:  Head CT 05/03/2020 FINDINGS: MRI HEAD FINDINGS Brain: Diffusion imaging does not show any acute or subacute infarction. There are old small vessel infarctions within the left posterior cerebellum. Mild chronic small-vessel change affects the pons. Cerebral hemispheres show generalized atrophy, temporal lobe predominant. There are moderate chronic small-vessel ischemic changes of the deep white matter. No large vessel territory cerebral hemispheric infarction. No mass lesion, hemorrhage, hydrocephalus or extra-axial collection. Vascular: Major vessels at the base of the brain show flow. Skull and upper cervical spine: Negative Sinuses/Orbits: Mild mucosal inflammatory changes of the sinuses. Orbits negative. Other: None MRA HEAD FINDINGS Both internal carotid arteries widely patent through the skull base and siphon regions. No siphon stenosis. The anterior and middle cerebral vessels are patent without proximal stenosis, aneurysm or vascular malformation. No large or medium vessel occlusion. Fenestrated left M1 segment. Both vertebral arteries are patent to the basilar. No basilar stenosis. Posterior circulation branch vessels appear normal. MRA NECK FINDINGS Limited resolution due to noncontrast technique. Both common carotid arteries show antegrade flow to the bifurcation regions. On the right, there is no apparent stenosis. On the left, there is too much artifact to evaluate the bifurcation region. Antegrade flow is present in both vertebral arteries. IMPRESSION: No acute intracranial  finding. 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 Moderate chronic small-vessel ischemic changes of the cerebral hemispheric deep white matter. Generalized atrophy, temporal lobe predominant. 1. MRA head: No large or medium vessel occlusion or correctable proximal stenosis. 2. MRA neck: Allowing for noncontrast technique, there is no visible carotid bifurcation stenosis on the right. Left carotid bifurcation cannot be seen because of artifact. Antegrade flow is present in both vertebral arteries. Electronically Signed   By: Paulina Fusi M.D.   On: 05/06/2020 13:56   MR BRAIN WO CONTRAST  Result Date: 05/12/2020 CLINICAL DATA:  Delirium. EXAM: MRI HEAD WITHOUT CONTRAST TECHNIQUE: Multiplanar, multiecho pulse sequences of the brain and surrounding structures were  obtained without intravenous contrast. COMPARISON:  Head CT May 10, 2020; MRI of the brain November 13, 20. FINDINGS: Brain: No acute infarction, hemorrhage, extra-axial collection or mass lesion. Scattered and confluent foci of T2 hyperintensity are seen within the white matter of the cerebral hemispheres. Prominence of the supratentorial ventricles and sylvian fissures with relative preservation of the high convexity sulci, may be seen the setting normal pressure hydrocephalus in the appropriate clinical scenario. Prominent volume loss of the mesial temporal lobes. No significant change from prior MRI. Vascular: Normal flow voids. Skull and upper cervical spine: Normal marrow signal. Sinuses/Orbits: Mucosal thickening the ethmoid cells and sphenoid sinuses. Right lens surgery. Other: Mild bilateral mastoid effusion. IMPRESSION: 1. No acute intracranial abnormality. 2. Prominence of the supratentorial ventricles and Sylvian fissures with relative preservation of the high convexity sulci, may be seen the setting of normal pressure hydrocephalus in the appropriate clinical scenario. 3. Prominent volume loss of the mesial temporal  lobes. 4. Moderate chronic small vessel ischemic changes. Electronically Signed   By: Baldemar Lenis M.D.   On: 05/12/2020 00:02   MR BRAIN WO CONTRAST  Result Date: 05/06/2020 CLINICAL DATA:  Posterior cerebellar infarction, age indeterminate. EXAM: MRI HEAD WITHOUT CONTRAST MRA HEAD WITHOUT CONTRAST MRA NECK WITHOUT CONTRAST TECHNIQUE: Multiplanar, multiecho pulse sequences of the brain and surrounding structures were obtained without intravenous contrast. Angiographic images of the Circle of Willis were obtained using MRA technique without intravenous contrast. Angiographic images of the neck were obtained using MRA technique without intravenous contrast. Carotid stenosis measurements (when applicable) are obtained utilizing NASCET criteria, using the distal internal carotid diameter as the denominator. COMPARISON:  Head CT 05/03/2020 FINDINGS: MRI HEAD FINDINGS Brain: Diffusion imaging does not show any acute or subacute infarction. There are old small vessel infarctions within the left posterior cerebellum. Mild chronic small-vessel change affects the pons. Cerebral hemispheres show generalized atrophy, temporal lobe predominant. There are moderate chronic small-vessel ischemic changes of the deep white matter. No large vessel territory cerebral hemispheric infarction. No mass lesion, hemorrhage, hydrocephalus or extra-axial collection. Vascular: Major vessels at the base of the brain show flow. Skull and upper cervical spine: Negative Sinuses/Orbits: Mild mucosal inflammatory changes of the sinuses. Orbits negative. Other: None MRA HEAD FINDINGS Both internal carotid arteries widely patent through the skull base and siphon regions. No siphon stenosis. The anterior and middle cerebral vessels are patent without proximal stenosis, aneurysm or vascular malformation. No large or medium vessel occlusion. Fenestrated left M1 segment. Both vertebral arteries are patent to the basilar. No basilar  stenosis. Posterior circulation branch vessels appear normal. MRA NECK FINDINGS Limited resolution due to noncontrast technique. Both common carotid arteries show antegrade flow to the bifurcation regions. On the right, there is no apparent stenosis. On the left, there is too much artifact to evaluate the bifurcation region. Antegrade flow is present in both vertebral arteries. IMPRESSION: No acute intracranial finding. 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 Moderate chronic small-vessel ischemic changes of the cerebral hemispheric deep white matter. Generalized atrophy, temporal lobe predominant. 1. MRA head: No large or medium vessel occlusion or correctable proximal stenosis. 2. MRA neck: Allowing for noncontrast technique, there is no visible carotid bifurcation stenosis on the right. Left carotid bifurcation cannot be seen because of artifact. Antegrade flow is present in both vertebral arteries. Electronically Signed   By: Paulina Fusi M.D.   On: 05/06/2020 13:56   DG CHEST PORT 1 VIEW  Result Date: 05/11/2020 CLINICAL DATA:  Altered mental status. EXAM: PORTABLE CHEST 1 VIEW COMPARISON:  05/03/2020. FINDINGS: Cardiomegaly. No pulmonary venous congestion. Low lung volumes. No focal infiltrate. No pleural effusion or pneumothorax. Degenerative change thoracic spine. IMPRESSION: 1. Cardiomegaly. No pulmonary venous congestion. 2. Low lung volumes. No focal infiltrate. Electronically Signed   By: Maisie Fus  Register   On: 05/11/2020 07:30   VAS US CAROTID  Result Date: 05/06/2020 Carotid Arterial Duplex Study Indications:       CVA. Risk Factors:      Hypertension. Limitations        Today's exam was limited due to the patient's respiratory                    variation. Comparison Study:  No prior studies. Performing Technologist: Chanda Busing RVT  Examination Guidelines: A complete evaluation includes B-mode imaging, spectral Doppler, color Doppler, and power Doppler as needed of all  accessible portions of each vessel. Bilateral testing is considered an integral part of a complete examination. Limited examinations for reoccurring indications may be performed as noted.  Right Carotid Findings: +----------+--------+--------+--------+-----------------------+--------+           PSV cm/sEDV cm/sStenosisPlaque Description     Comments +----------+--------+--------+--------+-----------------------+--------+ CCA Prox  111     14              smooth and heterogenous         +----------+--------+--------+--------+-----------------------+--------+ CCA Distal78      14              smooth and heterogenous         +----------+--------+--------+--------+-----------------------+--------+ ICA Prox  97      16              smooth and heterogenous         +----------+--------+--------+--------+-----------------------+--------+ ICA Distal51      14                                     tortuous +----------+--------+--------+--------+-----------------------+--------+ ECA       224     26                                              +----------+--------+--------+--------+-----------------------+--------+ +----------+--------+-------+--------+-------------------+           PSV cm/sEDV cmsDescribeArm Pressure (mmHG) +----------+--------+-------+--------+-------------------+ [id number]                                        +----------+--------+-------+--------+-------------------+ +---------+--------+--+--------+--+---------+ VertebralPSV cm/s59EDV cm/s12Antegrade +---------+--------+--+--------+--+---------+  Left Carotid Findings: +----------+--------+--------+--------+-----------------------+--------+           PSV cm/sEDV cm/sStenosisPlaque Description     Comments +----------+--------+--------+--------+-----------------------+--------+ CCA Prox  107     14              smooth and heterogenous          +----------+--------+--------+--------+-----------------------+--------+ CCA Distal111     14              smooth and heterogenous         +----------+--------+--------+--------+-----------------------+--------+ ICA Prox  133     21              smooth and heterogenous         +----------+--------+--------+--------+-----------------------+--------+  ICA Distal88      24                                     tortuous +----------+--------+--------+--------+-----------------------+--------+ ECA       107     9                                               +----------+--------+--------+--------+-----------------------+--------+ +----------+--------+--------+--------+-------------------+           PSV cm/sEDV cm/sDescribeArm Pressure (mmHG) +----------+--------+--------+--------+-------------------+ Subclavian200                                         +----------+--------+--------+--------+-------------------+ +---------+--------+--+--------+--+---------+ VertebralPSV cm/s57EDV cm/s12Antegrade +---------+--------+--+--------+--+---------+   Summary: Right Carotid: Velocities in the right ICA are consistent with a 1-39% stenosis. Left Carotid: Velocities in the left ICA are consistent with a 1-39% stenosis. Vertebrals: Bilateral vertebral arteries demonstrate antegrade flow. *See table(s) above for measurements and observations.  Electronically signed by Fabienne Bruns MD on 05/06/2020 at 11:49:43 AM.    Final    ECHOCARDIOGRAM LIMITED  Result Date: 05/04/2020    ECHOCARDIOGRAM LIMITED REPORT   Patient Name:   JVON MERONEY Date of Exam: 05/04/2020 Medical Rec #:  [id number]      Height:       68.0 in Accession #:    [id number]     Weight:       170.9 lb Date of Birth:  1940/09/28       BSA:          1.911 m Patient Age:    79 years       BP:           180/84 mmHg Patient Gender: M              HR:           70 bpm. Exam Location:  Inpatient Procedure: Limited Echo, Cardiac  Doppler and Color Doppler Indications:    Syncope  History:        Patient has no prior history of Echocardiogram examinations.                 COPD, Signs/Symptoms:Syncope; Risk Factors:Hypertension,                 Dyslipidemia and Former Smoker. Parkinsons Dementia, COVID+.  Sonographer:    Lavenia Atlas Referring Phys: Albertine Grates  Sonographer Comments: Technically challenging study due to limited acoustic windows. Image acquisition challenging due to COPD. IMPRESSIONS  1. Left ventricular ejection fraction, by estimation, is 55 to 60%. The left ventricle has normal function. There is mild left ventricular hypertrophy. Left ventricular diastolic parameters are consistent with Grade I diastolic dysfunction (impaired relaxation).  2. Right ventricular systolic function is normal. The right ventricular size is normal. Tricuspid regurgitation signal is inadequate for assessing PA pressure.  3. Left atrial size was mildly dilated.  4. The mitral valve is normal in structure. No evidence of mitral valve regurgitation. No evidence of mitral stenosis.  5. The aortic valve was not well visualized. Aortic valve regurgitation is trivial. No aortic stenosis is present.  6. Technically difficult study with poor acoustic windows. FINDINGS  Left Ventricle:  Left ventricular ejection fraction, by estimation, is 55 to 60%. The left ventricle has normal function. The left ventricular internal cavity size was normal in size. There is mild left ventricular hypertrophy. Left ventricular diastolic  parameters are consistent with Grade I diastolic dysfunction (impaired relaxation). Right Ventricle: The right ventricular size is normal. No increase in right ventricular wall thickness. Right ventricular systolic function is normal. Tricuspid regurgitation signal is inadequate for assessing PA pressure. Left Atrium: Left atrial size was mildly dilated. Right Atrium: Right atrial size was normal in size. Mitral Valve: The mitral valve is  normal in structure. No evidence of mitral valve stenosis. Tricuspid Valve: The tricuspid valve is normal in structure. Tricuspid valve regurgitation is not demonstrated. Aortic Valve: The aortic valve was not well visualized. Aortic valve regurgitation is trivial. No aortic stenosis is present. Pulmonic Valve: The pulmonic valve was not well visualized. Aorta: The aortic root is normal in size and structure. Venous: The inferior vena cava was not well visualized. IAS/Shunts: No atrial level shunt detected by color flow Doppler. LEFT VENTRICLE PLAX 2D LVIDd:         3.70 cm Diastology LVIDs:         2.60 cm LV e' medial:    4.79 cm/s LV PW:         1.40 cm LV E/e' medial:  9.3 LV IVS:        1.20 cm LV e' lateral:   5.77 cm/s                        LV E/e' lateral: 7.7  RIGHT VENTRICLE RV S prime:     7.07 cm/s MITRAL VALVE MV Area (PHT): 2.22 cm MV Decel Time: 342 msec MV E velocity: 44.60 cm/s MV A velocity: 62.60 cm/s MV E/A ratio:  0.71 Marca Ancona MD Electronically signed by Marca Ancona MD Signature Date/Time: 05/04/2020/3:45:20 PM    Final

## 2020-05-14 NOTE — Progress Notes (Signed)
  Speech Language Pathology Treatment: Dysphagia  Patient Details Name: [name] MRN: [id number] DOB: January 13, 1941 Today's Date: 05/14/2020 Time: 1540-1600 SLP Time Calculation (min) (ACUTE ONLY): 20 min  Assessment / Plan / Recommendation Clinical Impression  Patient seen for PO trials at bedside. Patient was alert enough but drowsy and required cues to keep eyes open at beginning of session. SLP removed dried secretions from hard palate and gumline. He held puree (applesauce) solids in mouth and despite appearing to attempt to orally manipulate and transit, SLP had to suction puree from top of his tongue. He was not able to adequately suck with straw but was able to form lips around cup when SLP slowly tilted cup to deliver small controlled sips of water. He did not exhibit any overt coughing or throat clearing but SLP did suspect there to be some oral anterior to posterior transit delays and likely pharyngeal delays. Patient able to manipulate ice chip minimally in mouth and mainly let it melt in mouth. Patient enjoyed the water. SLP allowing ice chips and water sips PRN after oral care and will continue to follow for full PO readiness.     HPI HPI: 79 y.o. male with a history of Parkinson's disease, dementia, increasing falls at home, HTN, covid-19 vaccinations, and BPH who presented to the ED from home after 2 episodes of loss of consciousness. He has had altered mental status since arrival, so history is limited.age-indeterminate infarct of the posterior left cerebellum was noted      SLP Plan  Continue with current plan of care       Recommendations  Diet recommendations: NPO;Other(comment) (ice chips/water sips after oral care) Medication Administration: Via alternative means Supervision: Full supervision/cueing for compensatory strategies;Trained caregiver to feed patient Compensations: Slow rate;Small sips/bites Postural Changes and/or Swallow Maneuvers: Seated upright 90  degrees                Oral Care Recommendations: Oral care QID Follow up Recommendations: Other (comment) (TBD) SLP Visit Diagnosis: Dysphagia, unspecified (R13.10) Plan: Continue with current plan of care       GO                Angela Nevin, MA, CCC-SLP Speech Therapy [facility]

## 2020-05-15 ENCOUNTER — Ambulatory Visit: Payer: Medicare Other | Admitting: Neurology

## 2020-05-15 ENCOUNTER — Inpatient Hospital Stay (HOSPITAL_COMMUNITY): Payer: Medicare Other

## 2020-05-15 DIAGNOSIS — J690 Pneumonitis due to inhalation of food and vomit: Secondary | ICD-10-CM

## 2020-05-15 DIAGNOSIS — R609 Edema, unspecified: Secondary | ICD-10-CM | POA: Diagnosis not present

## 2020-05-15 LAB — AMMONIA: Ammonia: 28 umol/L (ref 9–35)

## 2020-05-15 LAB — CBC
HCT: 28.6 % — ABNORMAL LOW (ref 39.0–52.0)
Hemoglobin: 9.5 g/dL — ABNORMAL LOW (ref 13.0–17.0)
MCH: 29.6 pg (ref 26.0–34.0)
MCHC: 33.2 g/dL (ref 30.0–36.0)
MCV: 89.1 fL (ref 80.0–100.0)
Platelets: 282 10*3/uL (ref 150–400)
RBC: 3.21 MIL/uL — ABNORMAL LOW (ref 4.22–5.81)
RDW: 11.7 % (ref 11.5–15.5)
WBC: 12.6 10*3/uL — ABNORMAL HIGH (ref 4.0–10.5)
nRBC: 0 % (ref 0.0–0.2)

## 2020-05-15 LAB — URINALYSIS, ROUTINE W REFLEX MICROSCOPIC
Bilirubin Urine: NEGATIVE
Glucose, UA: NEGATIVE mg/dL
Hgb urine dipstick: NEGATIVE
Ketones, ur: NEGATIVE mg/dL
Leukocytes,Ua: NEGATIVE
Nitrite: NEGATIVE
Protein, ur: NEGATIVE mg/dL
Specific Gravity, Urine: 1.018 (ref 1.005–1.030)
pH: 5 (ref 5.0–8.0)

## 2020-05-15 LAB — GLUCOSE, CAPILLARY
Glucose-Capillary: 119 mg/dL — ABNORMAL HIGH (ref 70–99)
Glucose-Capillary: 127 mg/dL — ABNORMAL HIGH (ref 70–99)
Glucose-Capillary: 127 mg/dL — ABNORMAL HIGH (ref 70–99)
Glucose-Capillary: 131 mg/dL — ABNORMAL HIGH (ref 70–99)
Glucose-Capillary: 142 mg/dL — ABNORMAL HIGH (ref 70–99)
Glucose-Capillary: 149 mg/dL — ABNORMAL HIGH (ref 70–99)

## 2020-05-15 LAB — COMPREHENSIVE METABOLIC PANEL
ALT: 11 U/L (ref 0–44)
AST: 10 U/L — ABNORMAL LOW (ref 15–41)
Albumin: 2.3 g/dL — ABNORMAL LOW (ref 3.5–5.0)
Alkaline Phosphatase: 46 U/L (ref 38–126)
Anion gap: 6 (ref 5–15)
BUN: 35 mg/dL — ABNORMAL HIGH (ref 8–23)
CO2: 20 mmol/L — ABNORMAL LOW (ref 22–32)
Calcium: 8.5 mg/dL — ABNORMAL LOW (ref 8.9–10.3)
Chloride: 114 mmol/L — ABNORMAL HIGH (ref 98–111)
Creatinine, Ser: 1.52 mg/dL — ABNORMAL HIGH (ref 0.61–1.24)
GFR, Estimated: 46 mL/min — ABNORMAL LOW (ref >60)
Glucose, Bld: 139 mg/dL — ABNORMAL HIGH (ref 70–99)
Potassium: 4.1 mmol/L (ref 3.5–5.1)
Sodium: 140 mmol/L (ref 135–145)
Total Bilirubin: 0.9 mg/dL (ref 0.3–1.2)
Total Protein: 5 g/dL — ABNORMAL LOW (ref 6.5–8.1)

## 2020-05-15 LAB — PROCALCITONIN: Procalcitonin: 0.43 ng/mL

## 2020-05-15 LAB — BRAIN NATRIURETIC PEPTIDE: B Natriuretic Peptide: 179.8 pg/mL — ABNORMAL HIGH (ref 0.0–100.0)

## 2020-05-15 LAB — PHOSPHORUS: Phosphorus: 1.9 mg/dL — ABNORMAL LOW (ref 2.5–4.6)

## 2020-05-15 MED ORDER — METRONIDAZOLE 50 MG/ML ORAL SUSPENSION
500.0000 mg | Freq: Three times a day (TID) | ORAL | Status: DC
  Administered 2020-05-15 – 2020-05-21 (×18): 500 mg
  Filled 2020-05-15 (×23): qty 10

## 2020-05-15 MED ORDER — SODIUM CHLORIDE 0.9 % IV SOLN
2.0000 g | Freq: Two times a day (BID) | INTRAVENOUS | Status: DC
  Administered 2020-05-15 – 2020-05-21 (×13): 2 g via INTRAVENOUS
  Filled 2020-05-15 (×13): qty 2

## 2020-05-15 NOTE — Progress Notes (Signed)
  Speech Language Pathology Treatment: Dysphagia  Patient Details Name: TARICK PARENTEAU MRN: [id number] DOB: [birth year] Today's Date: 05/15/2020 Time: 9937-1696 SLP Time Calculation (min) (ACUTE ONLY): 13 min  Assessment / Plan / Recommendation Clinical Impression  Pt's oral cavity requires frequent hygiene in attempts to remove significant debris on dorsal surface of tongue. Pt was awake and interactive but falls asleep if not stimulated after a minute. Delayed coughing after water inconsistently. His respirations were stable and did not increase significantly following water trials. Spoke with pt's RN re: recommendations to offer ice chips, and sips water tonight to moisten oral cavity and utilize swallow musculature after cleaning oral cavity. Will plan to return tomorrow for diagnostic treatment moving toward po or instrumental assessment. Parkinson's and current Covid increase risk for dysphagia and respiratory concerns.     HPI HPI: 79 y.o. male with a history of Parkinson's disease, dementia, increasing falls at home, HTN, covid-19 vaccinations, and BPH who presented to the ED from home after 2 episodes of loss of consciousness. He has had altered mental status since arrival, so history is limited.age-indeterminate infarct of the posterior left cerebellum was noted      SLP Plan  Continue with current plan of care       Recommendations  Diet recommendations: Other(comment) (sips water, ice chips) Liquids provided via: Cup;Straw Medication Administration: Via alternative means Compensations: Slow rate;Small sips/bites Postural Changes and/or Swallow Maneuvers: Seated upright 90 degrees                Oral Care Recommendations: Oral care QID Follow up Recommendations: Other (comment) (TBD) SLP Visit Diagnosis: Dysphagia, unspecified (R13.10) Plan: Continue with current plan of care       GO                [name] 05/15/2020, 3:51 PM [name] Demita Tobia  M.Ed Nurse, children's [phone] Office [phone]

## 2020-05-15 NOTE — Progress Notes (Signed)
Received pt from RN laila for continuity of care, with ongoing IVF, NGT feeding and water flush, voiding freely thru external cath.

## 2020-05-15 NOTE — Progress Notes (Signed)
Pharmacy Antibiotic Note  [name] is a 79 y.o. male admitted on 05/10/2020 with pneumonia.  Pharmacy has been consulted for Cefepime dosing. Abnormal CXR 11/21. Noted renal dysfunction.   Plan: Cefepime 2g IV q12h Trend WBC, temp, renal function  F/U infectious work-up   Height: 5\' 8"  (172.7 cm) Weight: 80.7 kg (177 lb 14.6 oz) IBW/kg (Calculated) : 68.4  Temp (24hrs), Avg:99.5 F (37.5 C), Min:98.4 F (36.9 C), Max:103.2 F (39.6 C)  Recent Labs  Lab 05/10/20 1715 05/11/20 0316 05/12/20 0124 05/13/20 0746 05/14/20 0847  WBC 6.9 5.6 5.3 5.6 5.9  CREATININE 2.53* 2.22* 1.73* 1.62* 1.54*    Estimated Creatinine Clearance: 37 mL/min (A) (by C-G formula based on SCr of 1.54 mg/dL (H)).    Allergies  Allergen Reactions  . Amlodipine Other (See Comments)    Edema w/10 mg  . Lorazepam Other (See Comments)    DO NOT GIVE BENZO TO [facility] PT WITH CONCERNS FOR LEWY BODY DEMENTIA, CAUSES PARADOXICAL WORSENING OF THEIR AGITATION!  . Penicillins Hives    Has patient had a PCN reaction causing immediate rash, facial/tongue/throat swelling, SOB or lightheadedness with hypotension: Yes Has patient had a PCN reaction causing severe rash involving mucus membranes or skin necrosis: No Has patient had a PCN reaction that required hospitalization No Has patient had a PCN reaction occurring within the last 10 years: No If all of the above answers are "NO", then may proceed with Cephalosporin use.   . Sulfonamide Derivatives Hives  . Atorvastatin     REACTION: leg cramps  . Sulfa Antibiotics Other (See Comments) and Palpitations    Sweating, passed out  . Telmisartan Other (See Comments)    ?fatigue   05/16/20, PharmD, BCPS Clinical Pharmacist Phone: [phone]

## 2020-05-15 NOTE — Progress Notes (Signed)
PROGRESS NOTE                                                                             PROGRESS NOTE                                                                                                                                                                                                             Patient Demographics:    [name], is a 79 y.o. male, DOB - August 29, 1940, WUJ:[id number]  Outpatient Primary MD for the patient is Plotnikov, Georgina Quint, MD    LOS - 5  Admit date - 05/10/2020    Chief Complaint  Patient presents with  . Altered Mental Status       Brief Narrative    79 y.o. male with medical history significant of BPH, venous insufficiency, CKD, Parkinson's dementia, hypertension, falls, neutropenia, thrombocytopenia, recent admission (from 11/10-11/16 due to altered mental status, findings of old CVA, syncope, incidental Covid infection) who presents from his rehab facility with altered mental status.  Patient unable to participate in HPI due to altered mental status, history obtained via chart review and with assistance of family.  MRI brain with no acute findings, and neurology were consulted             His wife states that she spoke with him by phone on Monday and he seemed to be his normal self.  However, when she went to see him at the facility on Wednesday he was no longer opening his eyes or speaking to her.  According to chart review, he was still alert on Tuesday when he was discharged.  Per chart review, facility.  Stated he had been nonresponsive since admission, but this is difficult to confirm.  He has had minimal p.o. intake or medications.  ED Course: Vital signs significant for hypertension to the 170s systolic, otherwise within normal limits.  Labs showed creatinine 2.53 from baseline of 1.5.  Hemoglobin 10.8 baseline of 1.6.  Calcium 8.5 with albumin 2.8.  UA within normal  limits, CK within normal limits, RVP with  known Covid.  CT head showed no acute abnormality.   Subjective:    [name] with fever of 103 overnight, otherwise no significant events.   Assessment  & Plan :    Principal Problem:   Acute encephalopathy Active Problems:   Essential hypertension   Chronic venous insufficiency   Dementia due to Parkinson's disease without behavioral disturbance (HCC)   Parkinson's syndrome (HCC)   History of CVA (cerebrovascular accident)  Acute encephalopathy/ Parkinson's dementia -With underlining Parkinson's dementia, he had recent admission with similar symptoms . -Symptoms appear to be worsened at the facility, he had extensive work-up during recent hospital stay, which did include MRI of the brain which did not show any acute changes, he had stable echo, EEG, carotid ultrasound, TEE, stable A1c, and LDL was around 90, for which he was started on statin and aspirin . - Currently afebrile without other signs of infection, CT head stable, UA with normal limits -Continue to provide supportive care including IV fluids, monitor electrolytes closely and replete as needed. - Avoid benzos and narcotics  -Neurology input greatly appreciated, in the setting of his Parkinson disease and dementia, his cognition status and mentation is expected to fluctuate and to deteriorate over time given it is natural progression of this illness, it is important for him to continue his Parkinson medications, so core track has been inserted, currently on fluids, tube feeds and his medications . -We will follow clinically, at one point he may need to discuss need for PEG tube if mentation does not improve, or he keep having these fluctuation in mentation frequently to ensure compliance with his Parkinson disease medications .  We will follow we will continue with current measures including cube feeds, meds through his core track tube, as he does appear to be improving, hopefully in 24 to 48 hours can have SLP evaluate him to  see if it is appropriate for him to start having oral intake.  Pneumonia -Patient with fever overnight of 103, he is with congested upper airway,(and has been on full aspiration precaution, he has been head of bed elevated at 35 degrees all the time, but he likely had some aspiration on his own secretions), chest x-ray significant for bibasilar opacities right> left, he is with penicillin allergy, so he will be started on cefepime and Flagyl for aspiration pneumonia. -Patient fever overnight is related to bacterial pneumonia, and not to COVID-19 infection, especially with procalcitonin trending up, glucose cytosis, and typical finding of bibasilar pneumonia in the setting of aspiration, COVID-19 isolation can be discontinued today as he is> 10 days of his recent incidental symptomatic COVID-19 infection.  I have discussed with ID Dr  Ilsa Iha, who is agreeable with the current assessment and discontinuation of his isolation.  COVID-19 19 infection -He is fully vaccinated, this is incidental finding on recent hospitalization on 11/10, asymptomatic, received monoclonal antibody 11/11, he will need 10 days isolation, isolation can be discontinued today.   History of CVA - Old infarction noted on previous admission with no acute changes -Continue aspirin,   AKI on CKD 3 -  Creatinine 2.53 (baseline 1.5), in the setting of decreased p.o. intake for the last day and half or so improving with IV fluids  Hypertension -Initially elevated as he is not able to take his meds, actually it is now on the lower side, so we will continue with Coreg but I will stop his hydralazine.    Parkinson  disease -Followed by Dr. Karel Jarvis   Hypokalemia -repleted   We will check venous Dopplers for left upper extremity edema.  SpO2: 100 %  Recent Labs  Lab 05/09/20 0447 05/09/20 0447 05/10/20 1715 05/10/20 1715 05/10/20 2150 05/11/20 0316 05/12/20 0124 05/13/20 0746 05/14/20 0847 05/15/20 0913  WBC 3.5*    < > 6.9   < >  --  5.6 5.3 5.6 5.9 12.6*  PLT 134*   < > 174   < >  --  182 229 294 360 282  CRP 4.3*  --   --   --   --  11.5*  --   --   --   --   BNP 126.2*  --   --   --   --   --   --   --   --  179.8*  DDIMER 0.99*  --   --   --   --   --   --   --   --   --   PROCALCITON  --   --   --   --   --   --   --   --   --  0.43  AST 15   < > 18  --   --  19 17  --  13* 10*  ALT 6   < > <5  --   --  6 11  --  11 11  ALKPHOS 54   < > 46  --   --  48 46  --  43 46  BILITOT 1.1   < > 1.1  --   --  1.0 1.1  --  0.6 0.9  ALBUMIN 2.8*   < > 2.8*  --   --  2.7* 2.6*  --  2.4* 2.3*  SARSCOV2NAA  --   --   --   --  POSITIVE*  --   --   --   --   --    < > = values in this interval not displayed.       ABG     Component Value Date/Time   TCO2 23 04/22/2011 0342       Condition - Extremely Guarded  Family Communication  : I have D/W wife and daughter Nonda Lou [phone]) by phone 05/13/2020,05/15/2020   Code Status :  Full  Consults  :  None  Procedures  :  None   Disposition Plan  :    Status is: Inpatient  Remains inpatient appropriate because:IV treatments appropriate due to intensity of illness or inability to take PO   Dispo: The patient is from: SNF              Anticipated d/c is to: SNF              Anticipated d/c date is: 3 days              Patient currently is not medically stable to d/c.      DVT Prophylaxis  :  Lovenox   Lab Results  Component Value Date   PLT 282 05/15/2020    Diet :  Diet Order            Diet NPO time specified Except for: Ice Chips, Other (See Comments)  Diet effective now                  Inpatient Medications  Scheduled Meds: . aspirin  81 mg Per  Tube Daily  . carbidopa-levodopa  1 tablet Per Tube TID WC  . carvedilol  6.25 mg Per Tube BID WC  . cholecalciferol  1,000 Units Per Tube Daily  . enoxaparin (LOVENOX) injection  40 mg Subcutaneous Daily  . feeding supplement (PROSource TF)  45 mL Per Tube BID  .  free water  155 mL Per Tube Q4H  . metroNIDAZOLE  500 mg Per Tube TID  . rivastigmine  1.5 mg Per Tube BID WC  . sodium chloride flush  3 mL Intravenous Q12H  . vitamin B-12  1,000 mcg Per Tube Daily   Continuous Infusions: . ceFEPime (MAXIPIME) IV 2 g (05/15/20 0850)  . dextrose 5 % and 0.9 % NaCl with KCl 20 mEq/L 50 mL/hr at 05/15/20 1008  . feeding supplement (OSMOLITE 1.5 CAL) 1,000 mL (05/14/20 2023)   PRN Meds:.acetaminophen (TYLENOL) oral liquid 160 mg/5 mL, hydrALAZINE  Antibiotics  :    Anti-infectives (From admission, onward)   Start     Dose/Rate Route Frequency Ordered Stop   05/15/20 0815  ceFEPIme (MAXIPIME) 2 g in sodium chloride 0.9 % 100 mL IVPB        2 g 200 mL/hr over 30 Minutes Intravenous Every 12 hours 05/15/20 0725     05/15/20 0800  metroNIDAZOLE (FLAGYL) 50 mg/ml oral suspension 500 mg        500 mg Per Tube 3 times daily 05/15/20 0708          Mliss Fritzawood Jolly Carlini M.D on 05/15/2020 at 1:31 PM  To page go to [email or web address]  [facility] -  Office  [phone]     Objective:   Vitals:   05/15/20 0600 05/15/20 0836 05/15/20 0915 05/15/20 1201  BP: 111/65 (!) 115/48 (!) 127/59   Pulse: 67 68 70 (!) 58  Resp: 15 15  16   Temp:  98.1 F (36.7 C)  97.9 F (36.6 C)  TempSrc:  Oral  Oral  SpO2: 100% 100%  100%  Weight:      Height:        Wt Readings from Last 3 Encounters:  05/15/20 80.7 kg  05/04/20 77.5 kg  04/13/20 77.5 kg     Intake/Output Summary (Last 24 hours) at 05/15/2020 1331 Last data filed at 05/15/2020 0409 Gross per 24 hour  Intake 3456.79 ml  Output 1200 ml  Net 2256.79 ml     Physical Exam  It is more awake and alert today, he is having his eyes open, asking if he can drink some Coca-Cola, answering questions more appropriately, but still confused with decreased cognition and insight Good air entry bilaterally, with bibasilar rhonchi Regular rate and rhythm Abdomen soft, nontender, bowel sounds present, Left  upper extremity edema significantly subsided     Data Review:    CBC Recent Labs  Lab 05/09/20 0447 05/09/20 0447 05/10/20 1715 05/10/20 1715 05/11/20 0316 05/12/20 0124 05/13/20 0746 05/14/20 0847 05/15/20 0913  WBC 3.5*   < > 6.9   < > 5.6 5.3 5.6 5.9 12.6*  HGB 11.6*   < > 10.8*   < > 10.6* 10.2* 10.4* 10.8* 9.5*  HCT 32.5*   < > 31.2*   < > 30.8* 29.3* 30.5* 30.7* 28.6*  PLT 134*   < > 174   < > 182 229 294 360 282  MCV 82.7   < > 86.7   < > 86.3 85.9 87.4 88.5 89.1  MCH 29.5   < > 30.0   < > 29.7  29.9 29.8 31.1 29.6  MCHC 35.7   < > 34.6   < > 34.4 34.8 34.1 35.2 33.2  RDW 11.2*   < > 11.7   < > 11.6 11.5 11.8 11.9 11.7  LYMPHSABS 1.2  --  1.4  --   --   --   --   --   --   MONOABS 0.4  --  0.6  --   --   --   --   --   --   EOSABS 0.0  --  0.0  --   --   --   --   --   --   BASOSABS 0.0  --  0.0  --   --   --   --   --   --    < > = values in this interval not displayed.    Recent Labs  Lab 05/09/20 0447 05/09/20 0447 05/10/20 1715 05/10/20 1715 05/11/20 0316 05/12/20 0124 05/12/20 0134 05/12/20 1506 05/13/20 0746 05/13/20 1828 05/14/20 0847 05/15/20 0847 05/15/20 0913  NA 137   < > 136   < > 139 143  --   --  142  --  141  --  140  K 3.4*   < > 3.5   < > 3.3* 3.4*  --   --  3.6  --  4.0  --  4.1  CL 104   < > 106   < > 108 114*  --   --  114*  --  114*  --  114*  CO2 24   < > 21*   < > 23 20*  --   --  21*  --  20*  --  20*  GLUCOSE 103*   < > 117*   < > 90 98  --   --  165*  --  139*  --  139*  BUN 19   < > 36*   < > 36* 33*  --   --  39*  --  38*  --  35*  CREATININE 1.47*   < > 2.53*   < > 2.22* 1.73*  --   --  1.62*  --  1.54*  --  1.52*  CALCIUM 8.6*   < > 8.5*   < > 8.7* 8.6*  --   --  8.6*  --  8.5*  --  8.5*  AST 15   < > 18  --  19 17  --   --   --   --  13*  --  10*  ALT 6   < > <5  --  6 11  --   --   --   --  11  --  11  ALKPHOS 54   < > 46  --  48 46  --   --   --   --  43  --  46  BILITOT 1.1   < > 1.1  --  1.0 1.1  --   --   --   --   0.6  --  0.9  ALBUMIN 2.8*   < > 2.8*  --  2.7* 2.6*  --   --   --   --  2.4*  --  2.3*  MG 2.2  --   --   --   --   --   --  1.9 1.9 2.0  --   --   --   CRP  4.3*  --   --   --  11.5*  --   --   --   --   --   --   --   --   DDIMER 0.99*  --   --   --   --   --   --   --   --   --   --   --   --   PROCALCITON  --   --   --   --   --   --   --   --   --   --   --   --  0.43  TSH  --   --   --   --   --   --  1.824  --   --   --   --   --   --   AMMONIA  --   --   --   --   --   --  19  --   --   --   --  28  --   BNP 126.2*  --   --   --   --   --   --   --   --   --   --   --  179.8*   < > = values in this interval not displayed.    ------------------------------------------------------------------------------------------------------------------ No results for input(s): CHOL, HDL, LDLCALC, TRIG, CHOLHDL, LDLDIRECT in the last 72 hours.  Lab Results  Component Value Date   HGBA1C 5.4 05/05/2020   ------------------------------------------------------------------------------------------------------------------ No results for input(s): TSH, T4TOTAL, T3FREE, THYROIDAB in the last 72 hours.  Invalid input(s): FREET3  Cardiac Enzymes No results for input(s): CKMB, TROPONINI, MYOGLOBIN in the last 168 hours.  Invalid input(s): CK ------------------------------------------------------------------------------------------------------------------    Component Value Date/Time   BNP 179.8 (H) 05/15/2020 0913    Micro Results Recent Results (from the past 240 hour(s))  Respiratory Panel by RT PCR (Flu A&B, Covid) - Nasopharyngeal Swab     Status: Abnormal   Collection Time: 05/10/20  9:50 PM   Specimen: Nasopharyngeal Swab  Result Value Ref Range Status   SARS Coronavirus 2 by RT PCR POSITIVE (A) NEGATIVE Final    Comment: RESULT CALLED TO, READ BACK BY AND VERIFIED WITH: Kerrin Champagne RN 05/11/20 AT 0002 SK (NOTE) SARS-CoV-2 target nucleic acids are DETECTED.  SARS-CoV-2 RNA is generally  detectable in upper respiratory specimens  during the acute phase of infection. Positive results are indicative of the presence of the identified virus, but do not rule out bacterial infection or co-infection with other pathogens not detected by the test. Clinical correlation with patient history and other diagnostic information is necessary to determine patient infection status. The expected result is Negative.  Fact Sheet for Patients:  https://www.moore.com/  Fact Sheet for Healthcare Providers: https://www.young.biz/  This test is not yet approved or cleared by the [place] FDA and  has been authorized for detection and/or diagnosis of SARS-CoV-2 by FDA under an Emergency Use Authorization (EUA).  This EUA will remain in effect (meaning this test can be [place] ed) for the duration of  the COVID-19 declaration under Section 564(b)(1) of the Act, 21 U.S.C. section 360bbb-3(b)(1), unless the authorization is terminated or revoked sooner.      Influenza A by PCR NEGATIVE NEGATIVE Final   Influenza B by PCR NEGATIVE NEGATIVE Final    Comment: (NOTE) The Xpert Xpress SARS-CoV-2/FLU/RSV assay is intended as an aid in  the diagnosis of influenza from Nasopharyngeal swab specimens and  should not be used as a sole basis for treatment. Nasal washings and  aspirates are unacceptable for Xpert Xpress SARS-CoV-2/FLU/RSV  testing.  Fact Sheet for Patients: https://www.moore.com/  Fact Sheet for Healthcare Providers: https://www.young.biz/  This test is not yet approved or cleared by the [place] FDA and  has been authorized for detection and/or diagnosis of SARS-CoV-2 by  FDA under an Emergency Use Authorization (EUA). This EUA will remain  in effect (meaning this test can be used) for the duration of the  Covid-19 declaration under Section 564(b)(1) of the Act, 21  U.S.C. section 360bbb-3(b)(1),  unless the authorization is  terminated or revoked. Performed at [facility], [address], [place], [place] [address]     Radiology Reports EEG  Result Date: 05/05/2020 Charlsie Quest, MD     05/05/2020  9:30 AM Patient Name: AZAREL BANNER MRN: [id number] Epilepsy Attending: Charlsie Quest Referring Physician/Provider: Dr Albertine Grates Date: 05/04/2020 Duration: 23.26 minutes Patient history: 79 year old male with episodes of syncope x2.  EEG to evaluate for seizures. Level of alertness: Awake, drowsy, sleep, comatose, lethargic AEDs during EEG study: None Technical aspects: This EEG study was done with scalp electrodes positioned according to the 10-20 International system of electrode placement. Electrical activity was acquired at a sampling rate of 500Hz  and reviewed with a high frequency filter of 70Hz  and a low frequency filter of 1Hz . EEG data were recorded continuously and digitally stored. Description: The posterior dominant rhythm consists of 8 Hz activity of moderate voltage (25-35 uV) seen predominantly in posterior head regions, symmetric and reactive to eye opening and eye closing. EEG showed continuous generalized 5 to 6 Hz theta as well as intermittent 2 to 3 Hz delta slowing. Hyperventilation and photic stimulation were not performed.   ABNORMALITY -Continuous slow, generalized IMPRESSION: This study is suggestive of mild diffuse encephalopathy, nonspecific etiology. No seizures or epileptiform discharges were seen throughout the recording.   DG Chest 2 View  Result Date: 05/03/2020 CLINICAL DATA:  79 year old male with a history of syncope EXAM: CHEST - 2 VIEW COMPARISON:  07/03/2019 FINDINGS: Cardiomediastinal silhouette likely unchanged in size and contour given the positioning. No pneumothorax. No pleural effusion. Coarsened interstitial markings bilateral lungs without confluent airspace disease. No acute displaced fracture. Degenerative changes of the  visualized spine. IMPRESSION: Negative for acute cardiopulmonary disease Electronically Signed   By: 13/03/2020 D.O.   On: 05/03/2020 13:50   CT Head Wo Contrast  Result Date: 05/10/2020 CLINICAL DATA:  Mental status changes EXAM: CT HEAD WITHOUT CONTRAST TECHNIQUE: Contiguous axial images were obtained from the base of the skull through the vertex without intravenous contrast. COMPARISON:  05/03/2020 FINDINGS: Brain: There is atrophy and chronic small vessel disease changes. No acute intracranial abnormality. Specifically, no hemorrhage, hydrocephalus, mass lesion, acute infarction, or significant intracranial injury. Vascular: No hyperdense vessel or unexpected calcification. Skull: No acute calvarial abnormality. Sinuses/Orbits: Visualized paranasal sinuses and mastoids clear. Orbital soft tissues unremarkable. Other: None IMPRESSION: Atrophy, chronic microvascular disease. No acute intracranial abnormality. Electronically Signed   By: 13/03/2020 M.D.   On: 05/10/2020 19:26   CT Head Wo Contrast  Result Date: 05/03/2020 CLINICAL DATA:  Mental status change, unknown cause. EXAM: CT HEAD WITHOUT CONTRAST TECHNIQUE: Contiguous axial images were obtained from the base of the skull through the vertex without intravenous contrast. COMPARISON:  Head CT 02/16/2019.  Brain MRI 11/23/2018. FINDINGS:  Brain: Moderate cerebral atrophy. Moderate ill-defined hypoattenuation within the cerebral white matter is nonspecific, but compatible with chronic small vessel ischemic disease. A small infarct is questioned within the posterior left cerebellar hemisphere, new from the prior head CT of 02/16/2019 but otherwise age indeterminate (series 2, image 9) (series 5, image 56). There is no acute intracranial hemorrhage. No demarcated cortical infarct. No extra-axial fluid collection. No evidence of intracranial mass. No midline shift. Vascular: No hyperdense vessel.  Atherosclerotic calcifications. Skull: Normal.  Negative for fracture or focal lesion. Sinuses/Orbits: Visualized orbits show no acute finding. Mild ethmoid and maxillary sinus mucosal thickening. IMPRESSION: A small infarct is questioned within the posterior left cerebellar hemisphere, new from the prior head CT of 02/16/2019 but otherwise age indeterminate. Moderate cerebral atrophy and chronic small vessel ischemic disease. Mild ethmoid and maxillary sinus mucosal thickening. Electronically Signed   By: Jackey Loge DO   On: 05/03/2020 19:26   MR ANGIO HEAD WO CONTRAST  Result Date: 05/06/2020 CLINICAL DATA:  Posterior cerebellar infarction, age indeterminate. EXAM: MRI HEAD WITHOUT CONTRAST MRA HEAD WITHOUT CONTRAST MRA NECK WITHOUT CONTRAST TECHNIQUE: Multiplanar, multiecho pulse sequences of the brain and surrounding structures were obtained without intravenous contrast. Angiographic images of the Circle of Willis were obtained using MRA technique without intravenous contrast. Angiographic images of the neck were obtained using MRA technique without intravenous contrast. Carotid stenosis measurements (when applicable) are obtained utilizing NASCET criteria, using the distal internal carotid diameter as the denominator. COMPARISON:  Head CT 05/03/2020 FINDINGS: MRI HEAD FINDINGS Brain: Diffusion imaging does not show any acute or subacute infarction. There are old small vessel infarctions within the left posterior cerebellum. Mild chronic small-vessel change affects the pons. Cerebral hemispheres show generalized atrophy, temporal lobe predominant. There are moderate chronic small-vessel ischemic changes of the deep white matter. No large vessel territory cerebral hemispheric infarction. No mass lesion, hemorrhage, hydrocephalus or extra-axial collection. Vascular: Major vessels at the base of the brain show flow. Skull and upper cervical spine: Negative Sinuses/Orbits: Mild mucosal inflammatory changes of the sinuses. Orbits negative. Other: None MRA  HEAD FINDINGS Both internal carotid arteries widely patent through the skull base and siphon regions. No siphon stenosis. The anterior and middle cerebral vessels are patent without proximal stenosis, aneurysm or vascular malformation. No large or medium vessel occlusion. Fenestrated left M1 segment. Both vertebral arteries are patent to the basilar. No basilar stenosis. Posterior circulation branch vessels appear normal. MRA NECK FINDINGS Limited resolution due to noncontrast technique. Both common carotid arteries show antegrade flow to the bifurcation regions. On the right, there is no apparent stenosis. On the left, there is too much artifact to evaluate the bifurcation region. Antegrade flow is present in both vertebral arteries. IMPRESSION: No acute intracranial finding. 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 Moderate chronic small-vessel ischemic changes of the cerebral hemispheric deep white matter. Generalized atrophy, temporal lobe predominant. 1. MRA head: No large or medium vessel occlusion or correctable proximal stenosis. 2. MRA neck: Allowing for noncontrast technique, there is no visible carotid bifurcation stenosis on the right. Left carotid bifurcation cannot be seen because of artifact. Antegrade flow is present in both vertebral arteries. Electronically Signed   By: Paulina Fusi M.D.   On: 05/06/2020 13:56   MR ANGIO NECK WO CONTRAST  Result Date: 05/06/2020 CLINICAL DATA:  Posterior cerebellar infarction, age indeterminate. EXAM: MRI HEAD WITHOUT CONTRAST MRA HEAD WITHOUT CONTRAST MRA NECK WITHOUT CONTRAST TECHNIQUE: Multiplanar, multiecho pulse sequences of the brain and surrounding  structures were obtained without intravenous contrast. Angiographic images of the Circle of Willis were obtained using MRA technique without intravenous contrast. Angiographic images of the neck were obtained using MRA technique without intravenous contrast. Carotid stenosis measurements (when  applicable) are obtained utilizing NASCET criteria, using the distal internal carotid diameter as the denominator. COMPARISON:  Head CT 05/03/2020 FINDINGS: MRI HEAD FINDINGS Brain: Diffusion imaging does not show any acute or subacute infarction. There are old small vessel infarctions within the left posterior cerebellum. Mild chronic small-vessel change affects the pons. Cerebral hemispheres show generalized atrophy, temporal lobe predominant. There are moderate chronic small-vessel ischemic changes of the deep white matter. No large vessel territory cerebral hemispheric infarction. No mass lesion, hemorrhage, hydrocephalus or extra-axial collection. Vascular: Major vessels at the base of the brain show flow. Skull and upper cervical spine: Negative Sinuses/Orbits: Mild mucosal inflammatory changes of the sinuses. Orbits negative. Other: None MRA HEAD FINDINGS Both internal carotid arteries widely patent through the skull base and siphon regions. No siphon stenosis. The anterior and middle cerebral vessels are patent without proximal stenosis, aneurysm or vascular malformation. No large or medium vessel occlusion. Fenestrated left M1 segment. Both vertebral arteries are patent to the basilar. No basilar stenosis. Posterior circulation branch vessels appear normal. MRA NECK FINDINGS Limited resolution due to noncontrast technique. Both common carotid arteries show antegrade flow to the bifurcation regions. On the right, there is no apparent stenosis. On the left, there is too much artifact to evaluate the bifurcation region. Antegrade flow is present in both vertebral arteries. IMPRESSION: No acute intracranial finding. 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 Moderate chronic small-vessel ischemic changes of the cerebral hemispheric deep white matter. Generalized atrophy, temporal lobe predominant. 1. MRA head: No large or medium vessel occlusion or correctable proximal stenosis. 2. MRA neck:  Allowing for noncontrast technique, there is no visible carotid bifurcation stenosis on the right. Left carotid bifurcation cannot be seen because of artifact. Antegrade flow is present in both vertebral arteries. Electronically Signed   By: Paulina Fusi M.D.   On: 05/06/2020 13:56   MR BRAIN WO CONTRAST  Result Date: 05/12/2020 CLINICAL DATA:  Delirium. EXAM: MRI HEAD WITHOUT CONTRAST TECHNIQUE: Multiplanar, multiecho pulse sequences of the brain and surrounding structures were obtained without intravenous contrast. COMPARISON:  Head CT May 10, 2020; MRI of the brain November 13, 20. FINDINGS: Brain: No acute infarction, hemorrhage, extra-axial collection or mass lesion. Scattered and confluent foci of T2 hyperintensity are seen within the white matter of the cerebral hemispheres. Prominence of the supratentorial ventricles and sylvian fissures with relative preservation of the high convexity sulci, may be seen the setting normal pressure hydrocephalus in the appropriate clinical scenario. Prominent volume loss of the mesial temporal lobes. No significant change from prior MRI. Vascular: Normal flow voids. Skull and upper cervical spine: Normal marrow signal. Sinuses/Orbits: Mucosal thickening the ethmoid cells and sphenoid sinuses. Right lens surgery. Other: Mild bilateral mastoid effusion. IMPRESSION: 1. No acute intracranial abnormality. 2. Prominence of the supratentorial ventricles and Sylvian fissures with relative preservation of the high convexity sulci, may be seen the setting of normal pressure hydrocephalus in the appropriate clinical scenario. 3. Prominent volume loss of the mesial temporal lobes. 4. Moderate chronic small vessel ischemic changes. Electronically Signed   By: Baldemar Lenis M.D.   On: 05/12/2020 00:02   MR BRAIN WO CONTRAST  Result Date: 05/06/2020 CLINICAL DATA:  Posterior cerebellar infarction, age indeterminate. EXAM: MRI HEAD WITHOUT CONTRAST MRA  HEAD  WITHOUT CONTRAST MRA NECK WITHOUT CONTRAST TECHNIQUE: Multiplanar, multiecho pulse sequences of the brain and surrounding structures were obtained without intravenous contrast. Angiographic images of the Circle of Willis were obtained using MRA technique without intravenous contrast. Angiographic images of the neck were obtained using MRA technique without intravenous contrast. Carotid stenosis measurements (when applicable) are obtained utilizing NASCET criteria, using the distal internal carotid diameter as the denominator. COMPARISON:  Head CT 05/03/2020 FINDINGS: MRI HEAD FINDINGS Brain: Diffusion imaging does not show any acute or subacute infarction. There are old small vessel infarctions within the left posterior cerebellum. Mild chronic small-vessel change affects the pons. Cerebral hemispheres show generalized atrophy, temporal lobe predominant. There are moderate chronic small-vessel ischemic changes of the deep white matter. No large vessel territory cerebral hemispheric infarction. No mass lesion, hemorrhage, hydrocephalus or extra-axial collection. Vascular: Major vessels at the base of the brain show flow. Skull and upper cervical spine: Negative Sinuses/Orbits: Mild mucosal inflammatory changes of the sinuses. Orbits negative. Other: None MRA HEAD FINDINGS Both internal carotid arteries widely patent through the skull base and siphon regions. No siphon stenosis. The anterior and middle cerebral vessels are patent without proximal stenosis, aneurysm or vascular malformation. No large or medium vessel occlusion. Fenestrated left M1 segment. Both vertebral arteries are patent to the basilar. No basilar stenosis. Posterior circulation branch vessels appear normal. MRA NECK FINDINGS Limited resolution due to noncontrast technique. Both common carotid arteries show antegrade flow to the bifurcation regions. On the right, there is no apparent stenosis. On the left, there is too much artifact to evaluate the  bifurcation region. Antegrade flow is present in both vertebral arteries. IMPRESSION: No acute intracranial finding. 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 Moderate chronic small-vessel ischemic changes of the cerebral hemispheric deep white matter. Generalized atrophy, temporal lobe predominant. 1. MRA head: No large or medium vessel occlusion or correctable proximal stenosis. 2. MRA neck: Allowing for noncontrast technique, there is no visible carotid bifurcation stenosis on the right. Left carotid bifurcation cannot be seen because of artifact. Antegrade flow is present in both vertebral arteries. Electronically Signed   By: Paulina Fusi M.D.   On: 05/06/2020 13:56   DG CHEST PORT 1 VIEW  Result Date: 05/14/2020 CLINICAL DATA:  Respiratory acidosis EXAM: PORTABLE CHEST 1 VIEW COMPARISON:  05/11/2020 FINDINGS: Esophageal tube tip overlies the mid gastric region. Development of right greater than left hazy airspace infiltrates. No pleural effusion. Stable cardiomediastinal silhouette with aortic atherosclerosis. No pneumothorax. IMPRESSION: Development of right greater than left hazy basilar airspace infiltrates. Electronically Signed   By: Jasmine Pang M.D.   On: 05/14/2020 23:53   DG CHEST PORT 1 VIEW  Result Date: 05/11/2020 CLINICAL DATA:  Altered mental status. EXAM: PORTABLE CHEST 1 VIEW COMPARISON:  05/03/2020. FINDINGS: Cardiomegaly. No pulmonary venous congestion. Low lung volumes. No focal infiltrate. No pleural effusion or pneumothorax. Degenerative change thoracic spine. IMPRESSION: 1. Cardiomegaly. No pulmonary venous congestion. 2. Low lung volumes. No focal infiltrate. Electronically Signed   By: Maisie Fus  Register   On: 05/11/2020 07:30   VAS US CAROTID  Result Date: 05/06/2020 Carotid Arterial Duplex Study Indications:       CVA. Risk Factors:      Hypertension. Limitations        Today's exam was limited due to the patient's respiratory                    variation.  Comparison Study:  No prior studies. Performing  Technologist: Chanda Busing RVT  Examination Guidelines: A complete evaluation includes B-mode imaging, spectral Doppler, color Doppler, and power Doppler as needed of all accessible portions of each vessel. Bilateral testing is considered an integral part of a complete examination. Limited examinations for reoccurring indications may be performed as noted.  Right Carotid Findings: +----------+--------+--------+--------+-----------------------+--------+           PSV cm/sEDV cm/sStenosisPlaque Description     Comments +----------+--------+--------+--------+-----------------------+--------+ CCA Prox  111     14              smooth and heterogenous         +----------+--------+--------+--------+-----------------------+--------+ CCA Distal78      14              smooth and heterogenous         +----------+--------+--------+--------+-----------------------+--------+ ICA Prox  97      16              smooth and heterogenous         +----------+--------+--------+--------+-----------------------+--------+ ICA Distal51      14                                     tortuous +----------+--------+--------+--------+-----------------------+--------+ ECA       224     26                                              +----------+--------+--------+--------+-----------------------+--------+ +----------+--------+-------+--------+-------------------+           PSV cm/sEDV cmsDescribeArm Pressure (mmHG) +----------+--------+-------+--------+-------------------+ [id number]                                        +----------+--------+-------+--------+-------------------+ +---------+--------+--+--------+--+---------+ VertebralPSV cm/s59EDV cm/s12Antegrade +---------+--------+--+--------+--+---------+  Left Carotid Findings: +----------+--------+--------+--------+-----------------------+--------+           PSV cm/sEDV  cm/sStenosisPlaque Description     Comments +----------+--------+--------+--------+-----------------------+--------+ CCA Prox  107     14              smooth and heterogenous         +----------+--------+--------+--------+-----------------------+--------+ CCA Distal111     14              smooth and heterogenous         +----------+--------+--------+--------+-----------------------+--------+ ICA Prox  133     21              smooth and heterogenous         +----------+--------+--------+--------+-----------------------+--------+ ICA Distal88      24                                     tortuous +----------+--------+--------+--------+-----------------------+--------+ ECA       107     9                                               +----------+--------+--------+--------+-----------------------+--------+ +----------+--------+--------+--------+-------------------+           PSV cm/sEDV cm/sDescribeArm Pressure (mmHG) +----------+--------+--------+--------+-------------------+ Subclavian200                                         +----------+--------+--------+--------+-------------------+ +---------+--------+--+--------+--+---------+  VertebralPSV cm/s57EDV cm/s12Antegrade +---------+--------+--+--------+--+---------+   Summary: Right Carotid: Velocities in the right ICA are consistent with a 1-39% stenosis. Left Carotid: Velocities in the left ICA are consistent with a 1-39% stenosis. Vertebrals: Bilateral vertebral arteries demonstrate antegrade flow. *See table(s) above for measurements and observations.  Electronically signed by Fabienne Bruns MD on 05/06/2020 at 11:49:43 AM.    Final    ECHOCARDIOGRAM LIMITED  Result Date: 05/04/2020    ECHOCARDIOGRAM LIMITED REPORT   Patient Name:   MELCHOR KIRCHGESSNER Date of Exam: 05/04/2020 Medical Rec #:  [id number]      Height:       68.0 in Accession #:    [id number]     Weight:       170.9 lb Date of Birth:  03-22-1941        BSA:          1.911 m Patient Age:    79 years       BP:           180/84 mmHg Patient Gender: M              HR:           70 bpm. Exam Location:  Inpatient Procedure: Limited Echo, Cardiac Doppler and Color Doppler Indications:    Syncope  History:        Patient has no prior history of Echocardiogram examinations.                 COPD, Signs/Symptoms:Syncope; Risk Factors:Hypertension,                 Dyslipidemia and Former Smoker. Parkinsons Dementia, COVID+.  Sonographer:    Lavenia Atlas Referring Phys: Albertine Grates  Sonographer Comments: Technically challenging study due to limited acoustic windows. Image acquisition challenging due to COPD. IMPRESSIONS  1. Left ventricular ejection fraction, by estimation, is 55 to 60%. The left ventricle has normal function. There is mild left ventricular hypertrophy. Left ventricular diastolic parameters are consistent with Grade I diastolic dysfunction (impaired relaxation).  2. Right ventricular systolic function is normal. The right ventricular size is normal. Tricuspid regurgitation signal is inadequate for assessing PA pressure.  3. Left atrial size was mildly dilated.  4. The mitral valve is normal in structure. No evidence of mitral valve regurgitation. No evidence of mitral stenosis.  5. The aortic valve was not well visualized. Aortic valve regurgitation is trivial. No aortic stenosis is present.  6. Technically difficult study with poor acoustic windows. FINDINGS  Left Ventricle: Left ventricular ejection fraction, by estimation, is 55 to 60%. The left ventricle has normal function. The left ventricular internal cavity size was normal in size. There is mild left ventricular hypertrophy. Left ventricular diastolic  parameters are consistent with Grade I diastolic dysfunction (impaired relaxation). Right Ventricle: The right ventricular size is normal. No increase in right ventricular wall thickness. Right ventricular systolic function is normal. Tricuspid  regurgitation signal is inadequate for assessing PA pressure. Left Atrium: Left atrial size was mildly dilated. Right Atrium: Right atrial size was normal in size. Mitral Valve: The mitral valve is normal in structure. No evidence of mitral valve stenosis. Tricuspid Valve: The tricuspid valve is normal in structure. Tricuspid valve regurgitation is not demonstrated. Aortic Valve: The aortic valve was not well visualized. Aortic valve regurgitation is trivial. No aortic stenosis is present. Pulmonic Valve: The pulmonic valve was not well visualized. Aorta: The aortic root is normal in size and structure. Venous: The inferior vena  cava was not well visualized. IAS/Shunts: No atrial level shunt detected by color flow Doppler. LEFT VENTRICLE PLAX 2D LVIDd:         3.70 cm Diastology LVIDs:         2.60 cm LV e' medial:    4.79 cm/s LV PW:         1.40 cm LV E/e' medial:  9.3 LV IVS:        1.20 cm LV e' lateral:   5.77 cm/s                        LV E/e' lateral: 7.7  RIGHT VENTRICLE RV S prime:     7.07 cm/s MITRAL VALVE MV Area (PHT): 2.22 cm MV Decel Time: 342 msec MV E velocity: 44.60 cm/s MV A velocity: 62.60 cm/s MV E/A ratio:  0.71 Marca Ancona MD Electronically signed by Marca Ancona MD Signature Date/Time: 05/04/2020/3:45:20 PM    Final

## 2020-05-15 NOTE — Progress Notes (Signed)
Pt yellow MEWS alert for fever and BP. Vitals rechecked and reported to charge RN. PRN hydralazine given and orders for tylenol requested from hospitalist and also given. Pt status improved with intervention. Also found to have coarse crackles upon auscultation, in NAD.Concern for aspiration vs fluid overload addressed with Dr. Antionette Char. STAT CXR ordered showed RLL PNA due to possible aspiration. MD ordered procalcitonin level and will consider ABX.    05/14/20 2010  Assess: MEWS Score  Temp (!) 101.9 F (38.8 C)  BP (!) 162/64  Pulse Rate 88  ECG Heart Rate 87  Resp 15  Level of Consciousness Responds to Voice  SpO2 98 %  O2 Device Room Air  Patient Activity (if Appropriate) In bed  Assess: MEWS Score  MEWS Temp 2  MEWS Systolic 0  MEWS Pulse 0  MEWS RR 0  MEWS LOC 1  MEWS Score 3  MEWS Score Color Yellow  Assess: if the MEWS score is Yellow or Red  Were vital signs taken at a resting state? Yes  Focused Assessment No change from prior assessment  Early Detection of Sepsis Score *See Row Information* Low  MEWS guidelines implemented *See Row Information* Yes  Treat  MEWS Interventions Escalated (See documentation below)  Pain Scale 0-10  Pain Score 0  Take Vital Signs  Increase Vital Sign Frequency  Yellow: Q 2hr X 2 then Q 4hr X 2, if remains yellow, continue Q 4hrs  Escalate  MEWS: Escalate Yellow: discuss with charge nurse/RN and consider discussing with provider and RRT  Notify: Charge Nurse/RN  Name of Charge Nurse/RN Notified Nikki RN  Date Charge Nurse/RN Notified 05/14/20  Time Charge Nurse/RN Notified 2034  Document  Patient Outcome Stabilized after interventions  Progress note created (see row info) Yes

## 2020-05-15 NOTE — Progress Notes (Signed)
Upper extremity venous LT study completed.   Please see CV Proc for preliminary results.   Crystle Carelli, RDMS  

## 2020-05-16 DIAGNOSIS — F0280 Dementia in other diseases classified elsewhere without behavioral disturbance: Secondary | ICD-10-CM | POA: Diagnosis not present

## 2020-05-16 DIAGNOSIS — G20 Parkinson's disease: Secondary | ICD-10-CM | POA: Diagnosis not present

## 2020-05-16 DIAGNOSIS — I10 Essential (primary) hypertension: Secondary | ICD-10-CM | POA: Diagnosis not present

## 2020-05-16 LAB — COMPREHENSIVE METABOLIC PANEL
ALT: 7 U/L (ref 0–44)
AST: 8 U/L — ABNORMAL LOW (ref 15–41)
Albumin: 2 g/dL — ABNORMAL LOW (ref 3.5–5.0)
Alkaline Phosphatase: 42 U/L (ref 38–126)
Anion gap: 8 (ref 5–15)
BUN: 34 mg/dL — ABNORMAL HIGH (ref 8–23)
CO2: 23 mmol/L (ref 22–32)
Calcium: 8.2 mg/dL — ABNORMAL LOW (ref 8.9–10.3)
Chloride: 109 mmol/L (ref 98–111)
Creatinine, Ser: 1.31 mg/dL — ABNORMAL HIGH (ref 0.61–1.24)
GFR, Estimated: 55 mL/min — ABNORMAL LOW (ref >60)
Glucose, Bld: 142 mg/dL — ABNORMAL HIGH (ref 70–99)
Potassium: 4 mmol/L (ref 3.5–5.1)
Sodium: 140 mmol/L (ref 135–145)
Total Bilirubin: 0.6 mg/dL (ref 0.3–1.2)
Total Protein: 4.6 g/dL — ABNORMAL LOW (ref 6.5–8.1)

## 2020-05-16 LAB — GLUCOSE, CAPILLARY
Glucose-Capillary: 117 mg/dL — ABNORMAL HIGH (ref 70–99)
Glucose-Capillary: 121 mg/dL — ABNORMAL HIGH (ref 70–99)
Glucose-Capillary: 122 mg/dL — ABNORMAL HIGH (ref 70–99)
Glucose-Capillary: 124 mg/dL — ABNORMAL HIGH (ref 70–99)
Glucose-Capillary: 124 mg/dL — ABNORMAL HIGH (ref 70–99)
Glucose-Capillary: 132 mg/dL — ABNORMAL HIGH (ref 70–99)

## 2020-05-16 LAB — CBC
HCT: 27.5 % — ABNORMAL LOW (ref 39.0–52.0)
Hemoglobin: 9.2 g/dL — ABNORMAL LOW (ref 13.0–17.0)
MCH: 30 pg (ref 26.0–34.0)
MCHC: 33.5 g/dL (ref 30.0–36.0)
MCV: 89.6 fL (ref 80.0–100.0)
Platelets: 242 10*3/uL (ref 150–400)
RBC: 3.07 MIL/uL — ABNORMAL LOW (ref 4.22–5.81)
RDW: 11.9 % (ref 11.5–15.5)
WBC: 9 10*3/uL (ref 4.0–10.5)
nRBC: 0 % (ref 0.0–0.2)

## 2020-05-16 LAB — PHOSPHORUS: Phosphorus: 2.1 mg/dL — ABNORMAL LOW (ref 2.5–4.6)

## 2020-05-16 LAB — C-REACTIVE PROTEIN: CRP: 7.8 mg/dL — ABNORMAL HIGH (ref <1.0)

## 2020-05-16 LAB — PROCALCITONIN: Procalcitonin: 0.5 ng/mL

## 2020-05-16 MED ORDER — K PHOS MONO-SOD PHOS DI & MONO 155-852-130 MG PO TABS
500.0000 mg | ORAL_TABLET | Freq: Two times a day (BID) | ORAL | Status: AC
  Administered 2020-05-16 – 2020-05-17 (×4): 500 mg
  Filled 2020-05-16 (×4): qty 2

## 2020-05-16 NOTE — Progress Notes (Signed)
Pt transferred to 5W30 non-covid room. Wife [name] called and updated on transfer.

## 2020-05-16 NOTE — Progress Notes (Addendum)
Patient was transferred from 5W20 covid room to 5W30 non-covid room. Alert and oriented x 2, no acute distress noted, no complaints. VS stable. Patient with PIV on RFA running fluids @ 50 ml/hr; right NGT running Osmolite 1.5 CAL @ 60 ml/hr.

## 2020-05-16 NOTE — Progress Notes (Signed)
Daughter called and was updated.

## 2020-05-16 NOTE — Progress Notes (Signed)
PROGRESS NOTE                                                                             PROGRESS NOTE                                                                                                                                                                                                             Patient Demographics:    [name], is a 79 y.o. male, DOB - 1941-02-07, ZOX:[id number]  Outpatient Primary MD for the patient is Plotnikov, Georgina Quint, MD    LOS - 6  Admit date - 05/10/2020    Chief Complaint  Patient presents with  . Altered Mental Status       Brief Narrative    79 y.o. male with medical history significant of BPH, venous insufficiency, CKD, Parkinson's dementia, hypertension, falls, neutropenia, thrombocytopenia, recent admission (from 11/10-11/16 due to altered mental status, findings of old CVA, syncope, incidental Covid infection) who presents from his rehab facility with altered mental status, appears to be having fluctuating mental status, neurology work-up this admission is as significant including MRI, neurology were consulted, and they reported this is expected fluctuation if his progressive Parkinson disease and dementia, and recommendation is for him to continue his Parkinson meds, so core track tube was inserted, mentation has significantly improved, initially obtunded and unresponsive, currently communicative, but remains unsafe to swallow, on 11/20 2 AM patient was febrile, chest x-ray significant for pneumonia, bacterial, aspiration for which she is started on antibiotics.   Subjective:    [name] with no significant events overnight as discussed with staff, patient himself denies any complaints.  .   Assessment  & Plan :    Principal Problem:   Acute encephalopathy Active Problems:   Essential hypertension   Chronic venous insufficiency   Dementia due to Parkinson's disease without behavioral  disturbance (HCC)   Parkinson's syndrome (HCC)   History of CVA (cerebrovascular accident)  Acute encephalopathy/ Parkinson's dementia -With underlining Parkinson's dementia, he had recent admission  with similar symptoms . -Symptoms appear to be worsened at the facility, he had extensive work-up during recent hospital stay, which did include MRI of the brain which did not show any acute changes, he had stable echo, EEG, carotid ultrasound, TEE, stable A1c, and LDL was around 90, for which he was started on statin and aspirin . - Currently afebrile without other signs of infection, CT head stable, UA with normal limits -Continue to provide supportive care including IV fluids, monitor electrolytes closely and replete as needed. - Avoid benzos and narcotics  -Neurology input greatly appreciated, in the setting of his Parkinson disease and dementia, his cognition status and mentation is expected to fluctuate and to deteriorate over time given it is natural progression of this illness, it is important for him to continue his Parkinson medications, so core track has been inserted, currently on fluids, tube feeds and his medications . -We will follow clinically, there is discussion about family for need for PEG tube if mentation keeps to fluctuate in the future where will need to ensure he is taking his Parkinson medications via alternative source if he cannot swallow, given his mentation is improving now, will hold on this discussion and follow clinically, but if he remains unable to pass his swallow evaluation, or keep having frequent fluctuation of mental status, then need to rediscuss again.    Pneumonia -Patient with feveron 11/22 , he is with congested upper airway,(He has been on full aspiration precaution, he has been head of bed elevated at 35 degrees all the time, but he likely had some aspiration on his own secretions), chest x-ray significant for bibasilar opacities right> left, he is with  penicillin allergy, so he will be started on cefepime and Flagyl for aspiration pneumonia. --Patient fever overnight is related to bacterial pneumonia, and not to COVID-19 infection, especially with procalcitonin trending up, glucose cytosis, and typical finding of bibasilar pneumonia in the setting of aspiration, COVID-19 isolation can be discontinued today as he is> 10 days of his recent incidental symptomatic COVID-19 infection.  I have discussed with ID Dr  Ilsa Iha, who is agreeable with the current assessment and discontinuation of his isolation.  COVID-19 19 infection -He is fully vaccinated, this is incidental finding on recent hospitalization on 11/10, asymptomatic, received monoclonal antibody 11/11, he did finish 10 days isolation, patient has been discontinued, awaiting bed availability and non-Covid unit.   History of CVA - Old infarction noted on previous admission with no acute changes -Continue aspirin,   AKI on CKD 3 -  Creatinine 2.53 (baseline 1.5), in the setting of decreased p.o. intake for the last day and half or so improving with IV fluids  Hypertension -Initially elevated as he is not able to take his meds, actually it is now on the lower side, so we will continue with Coreg but I will stop his hydralazine.    Parkinson  disease -Followed by Dr. Karel Jarvis   Hypokalemia -repleted   Hypophosphatemia -Repleted  Venous Doppler was obtained due to left upper extremity edema, no evidence of DVT, and edema has resolved with elevation  SpO2: 97 %  Recent Labs  Lab 05/10/20 1715 05/10/20 2150 05/11/20 0316 05/11/20 0316 05/12/20 0124 05/13/20 0746 05/14/20 0847 05/15/20 0913 05/16/20 0052  WBC   < >  --  5.6   < > 5.3 5.6 5.9 12.6* 9.0  PLT   < >  --  182   < > 229 294 360 282 242  CRP  --   --  11.5*  --   --   --   --   --  7.8*  BNP  --   --   --   --   --   --   --  179.8*  --   PROCALCITON  --   --   --   --   --   --   --  0.43 0.50  AST   < >  --  19   --  17  --  13* 10* 8*  ALT   < >  --  6  --  11  --  11 11 7   ALKPHOS   < >  --  48  --  46  --  43 46 42  BILITOT   < >  --  1.0  --  1.1  --  0.6 0.9 0.6  ALBUMIN   < >  --  2.7*  --  2.6*  --  2.4* 2.3* 2.0*  SARSCOV2NAA  --  POSITIVE*  --   --   --   --   --   --   --    < > = values in this interval not displayed.       ABG     Component Value Date/Time   TCO2 23 04/22/2011 0342       Condition - Extremely Guarded  Family Communication  : I have D/W wife and daughter Nonda Lou [phone]) by phone 05/13/2020,05/15/2020   Code Status :  Full  Consults  :  None  Procedures  :  None   Disposition Plan  :    Status is: Inpatient  Remains inpatient appropriate because:IV treatments appropriate due to intensity of illness or inability to take PO   Dispo: The patient is from: SNF              Anticipated d/c is to: SNF              Anticipated d/c date is: 3 days              Patient currently is not medically stable to d/c.      DVT Prophylaxis  :  Lovenox   Lab Results  Component Value Date   PLT 242 05/16/2020    Diet :  Diet Order            Diet NPO time specified Except for: Ice Chips, Other (See Comments)  Diet effective now                  Inpatient Medications  Scheduled Meds: . aspirin  81 mg Per Tube Daily  . carbidopa-levodopa  1 tablet Per Tube TID WC  . carvedilol  6.25 mg Per Tube BID WC  . cholecalciferol  1,000 Units Per Tube Daily  . enoxaparin (LOVENOX) injection  40 mg Subcutaneous Daily  . feeding supplement (PROSource TF)  45 mL Per Tube BID  . free water  155 mL Per Tube Q4H  . metroNIDAZOLE  500 mg Per Tube TID  . phosphorus  500 mg Per Tube BID  . rivastigmine  1.5 mg Per Tube BID WC  . sodium chloride flush  3 mL Intravenous Q12H  . vitamin B-12  1,000 mcg Per Tube Daily   Continuous Infusions: . ceFEPime (MAXIPIME) IV 2 g (05/16/20 0834)  . dextrose 5 % and 0.9 % NaCl with KCl 20 mEq/L 50 mL/hr at  05/16/20 0832  . feeding  supplement (OSMOLITE 1.5 CAL) 1,000 mL (05/16/20 0240)   PRN Meds:.acetaminophen (TYLENOL) oral liquid 160 mg/5 mL, hydrALAZINE  Antibiotics  :    Anti-infectives (From admission, onward)   Start     Dose/Rate Route Frequency Ordered Stop   05/15/20 0815  ceFEPIme (MAXIPIME) 2 g in sodium chloride 0.9 % 100 mL IVPB        2 g 200 mL/hr over 30 Minutes Intravenous Every 12 hours 05/15/20 0725     05/15/20 0800  metroNIDAZOLE (FLAGYL) 50 mg/ml oral suspension 500 mg        500 mg Per Tube 3 times daily 05/15/20 0708          Mliss Fritz Bralyn Folkert M.D on 05/16/2020 at 12:30 PM  To page go to [email or web address]  [facility] -  Office  [phone]     Objective:   Vitals:   05/16/20 0357 05/16/20 0745 05/16/20 1000 05/16/20 1143  BP: (!) 156/64 132/71 (!) 118/56   Pulse: 70 70 71   Resp: 20 16 18    Temp: 98.1 F (36.7 C) 97.6 F (36.4 C)  (!) 97.3 F (36.3 C)  TempSrc: Oral Oral  Oral  SpO2: 100% 100% 97%   Weight: 81.4 kg     Height:        Wt Readings from Last 3 Encounters:  05/16/20 81.4 kg  05/04/20 77.5 kg  04/13/20 77.5 kg     Intake/Output Summary (Last 24 hours) at 05/16/2020 1230 Last data filed at 05/16/2020 0357 Gross per 24 hour  Intake 2372.79 ml  Output 1150 ml  Net 1222.79 ml     Physical Exam  Awake, open eyes spontaneously, answering questions, he is oriented x2, but overall extremely frail and deconditioned  Symmetrical Chest wall movement, diminished  air entry at the bases with some Rales  RRR,No Gallops,Rubs or new Murmurs, No Parasternal Heave +ve B.Sounds, Abd Soft, No tenderness, No rebound - guarding or rigidity. No Cyanosis, Clubbing or edema, No new Rash or bruise        Data Review:    CBC Recent Labs  Lab 05/10/20 1715 05/11/20 0316 05/12/20 0124 05/13/20 0746 05/14/20 0847 05/15/20 0913 05/16/20 0052  WBC 6.9   < > 5.3 5.6 5.9 12.6* 9.0  HGB 10.8*   < > 10.2* 10.4* 10.8* 9.5* 9.2*    HCT 31.2*   < > 29.3* 30.5* 30.7* 28.6* 27.5*  PLT 174   < > 229 294 360 282 242  MCV 86.7   < > 85.9 87.4 88.5 89.1 89.6  MCH 30.0   < > 29.9 29.8 31.1 29.6 30.0  MCHC 34.6   < > 34.8 34.1 35.2 33.2 33.5  RDW 11.7   < > 11.5 11.8 11.9 11.7 11.9  LYMPHSABS 1.4  --   --   --   --   --   --   MONOABS 0.6  --   --   --   --   --   --   EOSABS 0.0  --   --   --   --   --   --   BASOSABS 0.0  --   --   --   --   --   --    < > = values in this interval not displayed.    Recent Labs  Lab 05/11/20 0316 05/11/20 0316 05/12/20 0124 05/12/20 0134 05/12/20 1506 05/13/20 0746 05/13/20 1828 05/14/20 0847 05/15/20 0847 05/15/20 0913 05/16/20 0052  NA 139   < >  143  --   --  142  --  141  --  140 140  K 3.3*   < > 3.4*  --   --  3.6  --  4.0  --  4.1 4.0  CL 108   < > 114*  --   --  114*  --  114*  --  114* 109  CO2 23   < > 20*  --   --  21*  --  20*  --  20* 23  GLUCOSE 90   < > 98  --   --  165*  --  139*  --  139* 142*  BUN 36*   < > 33*  --   --  39*  --  38*  --  35* 34*  CREATININE 2.22*   < > 1.73*  --   --  1.62*  --  1.54*  --  1.52* 1.31*  CALCIUM 8.7*   < > 8.6*  --   --  8.6*  --  8.5*  --  8.5* 8.2*  AST 19  --  17  --   --   --   --  13*  --  10* 8*  ALT 6  --  11  --   --   --   --  11  --  11 7  ALKPHOS 48  --  46  --   --   --   --  43  --  46 42  BILITOT 1.0  --  1.1  --   --   --   --  0.6  --  0.9 0.6  ALBUMIN 2.7*  --  2.6*  --   --   --   --  2.4*  --  2.3* 2.0*  MG  --   --   --   --  1.9 1.9 2.0  --   --   --   --   CRP 11.5*  --   --   --   --   --   --   --   --   --  7.8*  PROCALCITON  --   --   --   --   --   --   --   --   --  0.43 0.50  TSH  --   --   --  1.824  --   --   --   --   --   --   --   AMMONIA  --   --   --  19  --   --   --   --  28  --   --   BNP  --   --   --   --   --   --   --   --   --  179.8*  --    < > = values in this interval not displayed.     ------------------------------------------------------------------------------------------------------------------ No results for input(s): CHOL, HDL, LDLCALC, TRIG, CHOLHDL, LDLDIRECT in the last 72 hours.  Lab Results  Component Value Date   HGBA1C 5.4 05/05/2020   ------------------------------------------------------------------------------------------------------------------ No results for input(s): TSH, T4TOTAL, T3FREE, THYROIDAB in the last 72 hours.  Invalid input(s): FREET3  Cardiac Enzymes No results for input(s): CKMB, TROPONINI, MYOGLOBIN in the last 168 hours.  Invalid input(s): CK ------------------------------------------------------------------------------------------------------------------    Component Value Date/Time   BNP 179.8 (H) 05/15/2020 1610    Micro Results Recent  Results (from the past 240 hour(s))  Respiratory Panel by RT PCR (Flu A&B, Covid) - Nasopharyngeal Swab     Status: Abnormal   Collection Time: 05/10/20  9:50 PM   Specimen: Nasopharyngeal Swab  Result Value Ref Range Status   SARS Coronavirus 2 by RT PCR POSITIVE (A) NEGATIVE Final    Comment: RESULT CALLED TO, READ BACK BY AND VERIFIED WITH: Kerrin Champagne RN 05/11/20 AT 0002 SK (NOTE) SARS-CoV-2 target nucleic acids are DETECTED.  SARS-CoV-2 RNA is generally detectable in upper respiratory specimens  during the acute phase of infection. Positive results are indicative of the presence of the identified virus, but do not rule out bacterial infection or co-infection with other pathogens not detected by the test. Clinical correlation with patient history and other diagnostic information is necessary to determine patient infection status. The expected result is Negative.  Fact Sheet for Patients:  https://www.moore.com/  Fact Sheet for Healthcare Providers: https://www.young.biz/  This test is not yet approved or cleared by the [place] FDA  and  has been authorized for detection and/or diagnosis of SARS-CoV-2 by FDA under an Emergency Use Authorization (EUA).  This EUA will remain in effect (meaning this test can be [place] ed) for the duration of  the COVID-19 declaration under Section 564(b)(1) of the Act, 21 U.S.C. section 360bbb-3(b)(1), unless the authorization is terminated or revoked sooner.      Influenza A by PCR NEGATIVE NEGATIVE Final   Influenza B by PCR NEGATIVE NEGATIVE Final    Comment: (NOTE) The Xpert Xpress SARS-CoV-2/FLU/RSV assay is intended as an aid in  the diagnosis of influenza from Nasopharyngeal swab specimens and  should not be used as a sole basis for treatment. Nasal washings and  aspirates are unacceptable for Xpert Xpress SARS-CoV-2/FLU/RSV  testing.  Fact Sheet for Patients: https://www.moore.com/  Fact Sheet for Healthcare Providers: https://www.young.biz/  This test is not yet approved or cleared by the [place] FDA and  has been authorized for detection and/or diagnosis of SARS-CoV-2 by  FDA under an Emergency Use Authorization (EUA). This EUA will remain  in effect (meaning this test can be used) for the duration of the  Covid-19 declaration under Section 564(b)(1) of the Act, 21  U.S.C. section 360bbb-3(b)(1), unless the authorization is  terminated or revoked. Performed at [facility], [address] [place], [place] [address]   Culture, blood (routine x 2)     Status: None (Preliminary result)   Collection Time: 05/15/20  9:13 AM   Specimen: BLOOD  Result Value Ref Range Status   Specimen Description BLOOD LEFT ANTECUBITAL  Final   Special Requests   Final    BOTTLES DRAWN AEROBIC AND ANAEROBIC Blood Culture results may not be optimal due to an inadequate volume of blood received in culture bottles   Culture   Final    NO GROWTH < 24 HOURS Performed at [facility], [address] [place], [place] [address]    Report  Status PENDING  Incomplete  Culture, blood (routine x 2)     Status: None (Preliminary result)   Collection Time: 05/15/20  9:13 AM   Specimen: BLOOD  Result Value Ref Range Status   Specimen Description BLOOD BLOOD LEFT HAND  Final   Special Requests   Final    BOTTLES DRAWN AEROBIC AND ANAEROBIC Blood Culture results may not be optimal due to an inadequate volume of blood received in culture bottles   Culture   Final  NO GROWTH < 24 HOURS Performed at [facility], [address] [place], [place] [address]    Report Status PENDING  Incomplete    Radiology Reports EEG  Result Date: 05/05/2020 Charlsie Quest, MD     05/05/2020  9:30 AM Patient Name: MIKEL HARDGROVE MRN: [id number] Epilepsy Attending: Charlsie Quest Referring Physician/Provider: Dr Albertine Grates Date: 05/04/2020 Duration: 23.26 minutes Patient history: 79 year old male with episodes of syncope x2.  EEG to evaluate for seizures. Level of alertness: Awake, drowsy, sleep, comatose, lethargic AEDs during EEG study: None Technical aspects: This EEG study was done with scalp electrodes positioned according to the 10-20 International system of electrode placement. Electrical activity was acquired at a sampling rate of 500Hz  and reviewed with a high frequency filter of 70Hz  and a low frequency filter of 1Hz . EEG data were recorded continuously and digitally stored. Description: The posterior dominant rhythm consists of 8 Hz activity of moderate voltage (25-35 uV) seen predominantly in posterior head regions, symmetric and reactive to eye opening and eye closing. EEG showed continuous generalized 5 to 6 Hz theta as well as intermittent 2 to 3 Hz delta slowing. Hyperventilation and photic stimulation were not performed.   ABNORMALITY -Continuous slow, generalized IMPRESSION: This study is suggestive of mild diffuse encephalopathy, nonspecific etiology. No seizures or epileptiform discharges were seen throughout the recording. Charlsie Quest   DG Chest 2 View  Result Date: 05/03/2020 CLINICAL DATA:  78 year old male with a history of syncope EXAM: CHEST - 2 VIEW COMPARISON:  07/03/2019 FINDINGS: Cardiomediastinal silhouette likely unchanged in size and contour given the positioning. No pneumothorax. No pleural effusion. Coarsened interstitial markings bilateral lungs without confluent airspace disease. No acute displaced fracture. Degenerative changes of the visualized spine. IMPRESSION: Negative for acute cardiopulmonary disease Electronically Signed   By: Gilmer Mor D.O.   On: 05/03/2020 13:50   CT Head Wo Contrast  Result Date: 05/10/2020 CLINICAL DATA:  Mental status changes EXAM: CT HEAD WITHOUT CONTRAST TECHNIQUE: Contiguous axial images were obtained from the base of the skull through the vertex without intravenous contrast. COMPARISON:  05/03/2020 FINDINGS: Brain: There is atrophy and chronic small vessel disease changes. No acute intracranial abnormality. Specifically, no hemorrhage, hydrocephalus, mass lesion, acute infarction, or significant intracranial injury. Vascular: No hyperdense vessel or unexpected calcification. Skull: No acute calvarial abnormality. Sinuses/Orbits: Visualized paranasal sinuses and mastoids clear. Orbital soft tissues unremarkable. Other: None IMPRESSION: Atrophy, chronic microvascular disease. No acute intracranial abnormality. Electronically Signed   By: Charlett Nose M.D.   On: 05/10/2020 19:26   CT Head Wo Contrast  Result Date: 05/03/2020 CLINICAL DATA:  Mental status change, unknown cause. EXAM: CT HEAD WITHOUT CONTRAST TECHNIQUE: Contiguous axial images were obtained from the base of the skull through the vertex without intravenous contrast. COMPARISON:  Head CT 02/16/2019.  Brain MRI 11/23/2018. FINDINGS: Brain: Moderate cerebral atrophy. Moderate ill-defined hypoattenuation within the cerebral white matter is nonspecific, but compatible with chronic small vessel ischemic disease. A  small infarct is questioned within the posterior left cerebellar hemisphere, new from the prior head CT of 02/16/2019 but otherwise age indeterminate (series 2, image 9) (series 5, image 56). There is no acute intracranial hemorrhage. No demarcated cortical infarct. No extra-axial fluid collection. No evidence of intracranial mass. No midline shift. Vascular: No hyperdense vessel.  Atherosclerotic calcifications. Skull: Normal. Negative for fracture or focal lesion. Sinuses/Orbits: Visualized orbits show no acute finding. Mild ethmoid and maxillary sinus mucosal thickening. IMPRESSION: A small infarct  is questioned within the posterior left cerebellar hemisphere, new from the prior head CT of 02/16/2019 but otherwise age indeterminate. Moderate cerebral atrophy and chronic small vessel ischemic disease. Mild ethmoid and maxillary sinus mucosal thickening. Electronically Signed   By: Jackey Loge DO   On: 05/03/2020 19:26   MR ANGIO HEAD WO CONTRAST  Result Date: 05/06/2020 CLINICAL DATA:  Posterior cerebellar infarction, age indeterminate. EXAM: MRI HEAD WITHOUT CONTRAST MRA HEAD WITHOUT CONTRAST MRA NECK WITHOUT CONTRAST TECHNIQUE: Multiplanar, multiecho pulse sequences of the brain and surrounding structures were obtained without intravenous contrast. Angiographic images of the Circle of Willis were obtained using MRA technique without intravenous contrast. Angiographic images of the neck were obtained using MRA technique without intravenous contrast. Carotid stenosis measurements (when applicable) are obtained utilizing NASCET criteria, using the distal internal carotid diameter as the denominator. COMPARISON:  Head CT 05/03/2020 FINDINGS: MRI HEAD FINDINGS Brain: Diffusion imaging does not show any acute or subacute infarction. There are old small vessel infarctions within the left posterior cerebellum. Mild chronic small-vessel change affects the pons. Cerebral hemispheres show generalized atrophy,  temporal lobe predominant. There are moderate chronic small-vessel ischemic changes of the deep white matter. No large vessel territory cerebral hemispheric infarction. No mass lesion, hemorrhage, hydrocephalus or extra-axial collection. Vascular: Major vessels at the base of the brain show flow. Skull and upper cervical spine: Negative Sinuses/Orbits: Mild mucosal inflammatory changes of the sinuses. Orbits negative. Other: None MRA HEAD FINDINGS Both internal carotid arteries widely patent through the skull base and siphon regions. No siphon stenosis. The anterior and middle cerebral vessels are patent without proximal stenosis, aneurysm or vascular malformation. No large or medium vessel occlusion. Fenestrated left M1 segment. Both vertebral arteries are patent to the basilar. No basilar stenosis. Posterior circulation branch vessels appear normal. MRA NECK FINDINGS Limited resolution due to noncontrast technique. Both common carotid arteries show antegrade flow to the bifurcation regions. On the right, there is no apparent stenosis. On the left, there is too much artifact to evaluate the bifurcation region. Antegrade flow is present in both vertebral arteries. IMPRESSION: No acute intracranial finding. 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 Moderate chronic small-vessel ischemic changes of the cerebral hemispheric deep white matter. Generalized atrophy, temporal lobe predominant. 1. MRA head: No large or medium vessel occlusion or correctable proximal stenosis. 2. MRA neck: Allowing for noncontrast technique, there is no visible carotid bifurcation stenosis on the right. Left carotid bifurcation cannot be seen because of artifact. Antegrade flow is present in both vertebral arteries. Electronically Signed   By: Paulina Fusi M.D.   On: 05/06/2020 13:56   MR ANGIO NECK WO CONTRAST  Result Date: 05/06/2020 CLINICAL DATA:  Posterior cerebellar infarction, age indeterminate. EXAM: MRI HEAD  WITHOUT CONTRAST MRA HEAD WITHOUT CONTRAST MRA NECK WITHOUT CONTRAST TECHNIQUE: Multiplanar, multiecho pulse sequences of the brain and surrounding structures were obtained without intravenous contrast. Angiographic images of the Circle of Willis were obtained using MRA technique without intravenous contrast. Angiographic images of the neck were obtained using MRA technique without intravenous contrast. Carotid stenosis measurements (when applicable) are obtained utilizing NASCET criteria, using the distal internal carotid diameter as the denominator. COMPARISON:  Head CT 05/03/2020 FINDINGS: MRI HEAD FINDINGS Brain: Diffusion imaging does not show any acute or subacute infarction. There are old small vessel infarctions within the left posterior cerebellum. Mild chronic small-vessel change affects the pons. Cerebral hemispheres show generalized atrophy, temporal lobe predominant. There are moderate chronic small-vessel ischemic changes of the  deep white matter. No large vessel territory cerebral hemispheric infarction. No mass lesion, hemorrhage, hydrocephalus or extra-axial collection. Vascular: Major vessels at the base of the brain show flow. Skull and upper cervical spine: Negative Sinuses/Orbits: Mild mucosal inflammatory changes of the sinuses. Orbits negative. Other: None MRA HEAD FINDINGS Both internal carotid arteries widely patent through the skull base and siphon regions. No siphon stenosis. The anterior and middle cerebral vessels are patent without proximal stenosis, aneurysm or vascular malformation. No large or medium vessel occlusion. Fenestrated left M1 segment. Both vertebral arteries are patent to the basilar. No basilar stenosis. Posterior circulation branch vessels appear normal. MRA NECK FINDINGS Limited resolution due to noncontrast technique. Both common carotid arteries show antegrade flow to the bifurcation regions. On the right, there is no apparent stenosis. On the left, there is too much  artifact to evaluate the bifurcation region. Antegrade flow is present in both vertebral arteries. IMPRESSION: No acute intracranial finding. 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 Moderate chronic small-vessel ischemic changes of the cerebral hemispheric deep white matter. Generalized atrophy, temporal lobe predominant. 1. MRA head: No large or medium vessel occlusion or correctable proximal stenosis. 2. MRA neck: Allowing for noncontrast technique, there is no visible carotid bifurcation stenosis on the right. Left carotid bifurcation cannot be seen because of artifact. Antegrade flow is present in both vertebral arteries. Electronically Signed   By: Paulina Fusi M.D.   On: 05/06/2020 13:56   MR BRAIN WO CONTRAST  Result Date: 05/12/2020 CLINICAL DATA:  Delirium. EXAM: MRI HEAD WITHOUT CONTRAST TECHNIQUE: Multiplanar, multiecho pulse sequences of the brain and surrounding structures were obtained without intravenous contrast. COMPARISON:  Head CT May 10, 2020; MRI of the brain November 13, 20. FINDINGS: Brain: No acute infarction, hemorrhage, extra-axial collection or mass lesion. Scattered and confluent foci of T2 hyperintensity are seen within the white matter of the cerebral hemispheres. Prominence of the supratentorial ventricles and sylvian fissures with relative preservation of the high convexity sulci, may be seen the setting normal pressure hydrocephalus in the appropriate clinical scenario. Prominent volume loss of the mesial temporal lobes. No significant change from prior MRI. Vascular: Normal flow voids. Skull and upper cervical spine: Normal marrow signal. Sinuses/Orbits: Mucosal thickening the ethmoid cells and sphenoid sinuses. Right lens surgery. Other: Mild bilateral mastoid effusion. IMPRESSION: 1. No acute intracranial abnormality. 2. Prominence of the supratentorial ventricles and Sylvian fissures with relative preservation of the high convexity sulci, may be seen  the setting of normal pressure hydrocephalus in the appropriate clinical scenario. 3. Prominent volume loss of the mesial temporal lobes. 4. Moderate chronic small vessel ischemic changes. Electronically Signed   By: Baldemar Lenis M.D.   On: 05/12/2020 00:02   MR BRAIN WO CONTRAST  Result Date: 05/06/2020 CLINICAL DATA:  Posterior cerebellar infarction, age indeterminate. EXAM: MRI HEAD WITHOUT CONTRAST MRA HEAD WITHOUT CONTRAST MRA NECK WITHOUT CONTRAST TECHNIQUE: Multiplanar, multiecho pulse sequences of the brain and surrounding structures were obtained without intravenous contrast. Angiographic images of the Circle of Willis were obtained using MRA technique without intravenous contrast. Angiographic images of the neck were obtained using MRA technique without intravenous contrast. Carotid stenosis measurements (when applicable) are obtained utilizing NASCET criteria, using the distal internal carotid diameter as the denominator. COMPARISON:  Head CT 05/03/2020 FINDINGS: MRI HEAD FINDINGS Brain: Diffusion imaging does not show any acute or subacute infarction. There are old small vessel infarctions within the left posterior cerebellum. Mild chronic small-vessel change affects the pons.  Cerebral hemispheres show generalized atrophy, temporal lobe predominant. There are moderate chronic small-vessel ischemic changes of the deep white matter. No large vessel territory cerebral hemispheric infarction. No mass lesion, hemorrhage, hydrocephalus or extra-axial collection. Vascular: Major vessels at the base of the brain show flow. Skull and upper cervical spine: Negative Sinuses/Orbits: Mild mucosal inflammatory changes of the sinuses. Orbits negative. Other: None MRA HEAD FINDINGS Both internal carotid arteries widely patent through the skull base and siphon regions. No siphon stenosis. The anterior and middle cerebral vessels are patent without proximal stenosis, aneurysm or vascular  malformation. No large or medium vessel occlusion. Fenestrated left M1 segment. Both vertebral arteries are patent to the basilar. No basilar stenosis. Posterior circulation branch vessels appear normal. MRA NECK FINDINGS Limited resolution due to noncontrast technique. Both common carotid arteries show antegrade flow to the bifurcation regions. On the right, there is no apparent stenosis. On the left, there is too much artifact to evaluate the bifurcation region. Antegrade flow is present in both vertebral arteries. IMPRESSION: No acute intracranial finding. 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 Moderate chronic small-vessel ischemic changes of the cerebral hemispheric deep white matter. Generalized atrophy, temporal lobe predominant. 1. MRA head: No large or medium vessel occlusion or correctable proximal stenosis. 2. MRA neck: Allowing for noncontrast technique, there is no visible carotid bifurcation stenosis on the right. Left carotid bifurcation cannot be seen because of artifact. Antegrade flow is present in both vertebral arteries. Electronically Signed   By: Paulina Fusi M.D.   On: 05/06/2020 13:56   DG CHEST PORT 1 VIEW  Result Date: 05/14/2020 CLINICAL DATA:  Respiratory acidosis EXAM: PORTABLE CHEST 1 VIEW COMPARISON:  05/11/2020 FINDINGS: Esophageal tube tip overlies the mid gastric region. Development of right greater than left hazy airspace infiltrates. No pleural effusion. Stable cardiomediastinal silhouette with aortic atherosclerosis. No pneumothorax. IMPRESSION: Development of right greater than left hazy basilar airspace infiltrates. Electronically Signed   By: Jasmine Pang M.D.   On: 05/14/2020 23:53   DG CHEST PORT 1 VIEW  Result Date: 05/11/2020 CLINICAL DATA:  Altered mental status. EXAM: PORTABLE CHEST 1 VIEW COMPARISON:  05/03/2020. FINDINGS: Cardiomegaly. No pulmonary venous congestion. Low lung volumes. No focal infiltrate. No pleural effusion or  pneumothorax. Degenerative change thoracic spine. IMPRESSION: 1. Cardiomegaly. No pulmonary venous congestion. 2. Low lung volumes. No focal infiltrate. Electronically Signed   By: Maisie Fus  Register   On: 05/11/2020 07:30   VAS US CAROTID  Result Date: 05/06/2020 Carotid Arterial Duplex Study Indications:       CVA. Risk Factors:      Hypertension. Limitations        Today's exam was limited due to the patient's respiratory                    variation. Comparison Study:  No prior studies. Performing Technologist: Chanda Busing RVT  Examination Guidelines: A complete evaluation includes B-mode imaging, spectral Doppler, color Doppler, and power Doppler as needed of all accessible portions of each vessel. Bilateral testing is considered an integral part of a complete examination. Limited examinations for reoccurring indications may be performed as noted.  Right Carotid Findings: +----------+--------+--------+--------+-----------------------+--------+           PSV cm/sEDV cm/sStenosisPlaque Description     Comments +----------+--------+--------+--------+-----------------------+--------+ CCA Prox  111     14              smooth and heterogenous         +----------+--------+--------+--------+-----------------------+--------+  CCA Distal78      14              smooth and heterogenous         +----------+--------+--------+--------+-----------------------+--------+ ICA Prox  97      16              smooth and heterogenous         +----------+--------+--------+--------+-----------------------+--------+ ICA Distal51      14                                     tortuous +----------+--------+--------+--------+-----------------------+--------+ ECA       224     26                                              +----------+--------+--------+--------+-----------------------+--------+ +----------+--------+-------+--------+-------------------+           PSV cm/sEDV cmsDescribeArm  Pressure (mmHG) +----------+--------+-------+--------+-------------------+ [id number]                                        +----------+--------+-------+--------+-------------------+ +---------+--------+--+--------+--+---------+ VertebralPSV cm/s59EDV cm/s12Antegrade +---------+--------+--+--------+--+---------+  Left Carotid Findings: +----------+--------+--------+--------+-----------------------+--------+           PSV cm/sEDV cm/sStenosisPlaque Description     Comments +----------+--------+--------+--------+-----------------------+--------+ CCA Prox  107     14              smooth and heterogenous         +----------+--------+--------+--------+-----------------------+--------+ CCA Distal111     14              smooth and heterogenous         +----------+--------+--------+--------+-----------------------+--------+ ICA Prox  133     21              smooth and heterogenous         +----------+--------+--------+--------+-----------------------+--------+ ICA Distal88      24                                     tortuous +----------+--------+--------+--------+-----------------------+--------+ ECA       107     9                                               +----------+--------+--------+--------+-----------------------+--------+ +----------+--------+--------+--------+-------------------+           PSV cm/sEDV cm/sDescribeArm Pressure (mmHG) +----------+--------+--------+--------+-------------------+ Subclavian200                                         +----------+--------+--------+--------+-------------------+ +---------+--------+--+--------+--+---------+ VertebralPSV cm/s57EDV cm/s12Antegrade +---------+--------+--+--------+--+---------+   Summary: Right Carotid: Velocities in the right ICA are consistent with a 1-39% stenosis. Left Carotid: Velocities in the left ICA are consistent with a 1-39% stenosis. Vertebrals: Bilateral vertebral  arteries demonstrate antegrade flow. *See table(s) above for measurements and observations.  Electronically signed by Fabienne Brunsharles Fields MD on 05/06/2020 at 11:49:43 AM.    Final    VAS US UPPER EXTREMITY VENOUS  DUPLEX  Result Date: 05/15/2020 UPPER VENOUS STUDY  Indications: Edema Comparison Study: No prior studies. Performing Technologist: Jean Rosenthal  Examination Guidelines: A complete evaluation includes B-mode imaging, spectral Doppler, color Doppler, and power Doppler as needed of all accessible portions of each vessel. Bilateral testing is considered an integral part of a complete examination. Limited examinations for reoccurring indications may be performed as noted.  Right Findings: +----------+------------+---------+-----------+----------+-------+ RIGHT     CompressiblePhasicitySpontaneousPropertiesSummary +----------+------------+---------+-----------+----------+-------+ Subclavian    Full       Yes       Yes                      +----------+------------+---------+-----------+----------+-------+  Left Findings: +----------+------------+---------+-----------+----------+-------+ LEFT      CompressiblePhasicitySpontaneousPropertiesSummary +----------+------------+---------+-----------+----------+-------+ IJV           Full       Yes       Yes                      +----------+------------+---------+-----------+----------+-------+ Subclavian    Full       Yes       Yes                      +----------+------------+---------+-----------+----------+-------+ Axillary      Full       Yes       Yes                      +----------+------------+---------+-----------+----------+-------+ Brachial      Full                                          +----------+------------+---------+-----------+----------+-------+ Radial        Full                                          +----------+------------+---------+-----------+----------+-------+ Ulnar         Full                                           +----------+------------+---------+-----------+----------+-------+ Cephalic      Full                                          +----------+------------+---------+-----------+----------+-------+ Basilic       Full                                          +----------+------------+---------+-----------+----------+-------+  Summary:  Right: No evidence of thrombosis in the subclavian.  Left: No evidence of deep vein thrombosis in the upper extremity. No evidence of superficial vein thrombosis in the upper extremity.  *See table(s) above for measurements and observations.    Preliminary    ECHOCARDIOGRAM LIMITED  Result Date: 05/04/2020    ECHOCARDIOGRAM LIMITED REPORT   Patient Name:   DORION PETILLO Date of Exam: 05/04/2020 Medical Rec #:  [id number]      Height:  68.0 in Accession #:    [id number]     Weight:       170.9 lb Date of Birth:  [birth year]       BSA:          1.911 m Patient Age:    79 years       BP:           180/84 mmHg Patient Gender: M              HR:           70 bpm. Exam Location:  Inpatient Procedure: Limited Echo, Cardiac Doppler and Color Doppler Indications:    Syncope  History:        Patient has no prior history of Echocardiogram examinations.                 COPD, Signs/Symptoms:Syncope; Risk Factors:Hypertension,                 Dyslipidemia and Former Smoker. Parkinsons Dementia, COVID+.  Sonographer:    Lavenia Atlas Referring Phys: Albertine Grates  Sonographer Comments: Technically challenging study due to limited acoustic windows. Image acquisition challenging due to COPD. IMPRESSIONS  1. Left ventricular ejection fraction, by estimation, is 55 to 60%. The left ventricle has normal function. There is mild left ventricular hypertrophy. Left ventricular diastolic parameters are consistent with Grade I diastolic dysfunction (impaired relaxation).  2. Right ventricular systolic function is normal. The right ventricular size is  normal. Tricuspid regurgitation signal is inadequate for assessing PA pressure.  3. Left atrial size was mildly dilated.  4. The mitral valve is normal in structure. No evidence of mitral valve regurgitation. No evidence of mitral stenosis.  5. The aortic valve was not well visualized. Aortic valve regurgitation is trivial. No aortic stenosis is present.  6. Technically difficult study with poor acoustic windows. FINDINGS  Left Ventricle: Left ventricular ejection fraction, by estimation, is 55 to 60%. The left ventricle has normal function. The left ventricular internal cavity size was normal in size. There is mild left ventricular hypertrophy. Left ventricular diastolic  parameters are consistent with Grade I diastolic dysfunction (impaired relaxation). Right Ventricle: The right ventricular size is normal. No increase in right ventricular wall thickness. Right ventricular systolic function is normal. Tricuspid regurgitation signal is inadequate for assessing PA pressure. Left Atrium: Left atrial size was mildly dilated. Right Atrium: Right atrial size was normal in size. Mitral Valve: The mitral valve is normal in structure. No evidence of mitral valve stenosis. Tricuspid Valve: The tricuspid valve is normal in structure. Tricuspid valve regurgitation is not demonstrated. Aortic Valve: The aortic valve was not well visualized. Aortic valve regurgitation is trivial. No aortic stenosis is present. Pulmonic Valve: The pulmonic valve was not well visualized. Aorta: The aortic root is normal in size and structure. Venous: The inferior vena cava was not well visualized. IAS/Shunts: No atrial level shunt detected by color flow Doppler. LEFT VENTRICLE PLAX 2D LVIDd:         3.70 cm Diastology LVIDs:         2.60 cm LV e' medial:    4.79 cm/s LV PW:         1.40 cm LV E/e' medial:  9.3 LV IVS:        1.20 cm LV e' lateral:   5.77 cm/s                        LV  E/e' lateral: 7.7  RIGHT VENTRICLE RV S prime:     7.07 cm/s  MITRAL VALVE MV Area (PHT): 2.22 cm MV Decel Time: 342 msec MV E velocity: 44.60 cm/s MV A velocity: 62.60 cm/s MV E/A ratio:  0.71 Marca Ancona MD Electronically signed by Marca Ancona MD Signature Date/Time: 05/04/2020/3:45:20 PM    Final

## 2020-05-16 NOTE — Progress Notes (Signed)
Physical Therapy Treatment Patient Details Name: [name] MRN: [id number] DOB: 14-Sep-1940 Today's Date: 05/16/2020    History of Present Illness 79yo male with recent admit 11/10 to 11/16 due to AMS/syncope/incidental Covid, now returning to the ED from his SNF with AMS and non-responsiveness. Admitted with acute encephalopathy and parkinsons dementia in the context of Covid. PMH HLD, HTN, parasomnia, CKD, parkinsons dementia, falls    PT Comments    Patient easily aroused on arrival and stayed awake throughout session. Focused on AAROM to AROM x 4 extremities with pt demonstrating ability to follow 1 step commands ~75% of the time with increased time. He demonstrated increased strength in right extremities with edema in bil UEs (right worse). Now that pt is more awake and able to participate, will attempt mobility with +2 assist on next visit.    Follow Up Recommendations  SNF;Supervision/Assistance - 24 hour     Equipment Recommendations  Wheelchair (measurements PT);Wheelchair cushion (measurements PT);Other (comment) (hoyer lift)    Recommendations for Other Services       Precautions / Restrictions Precautions Precautions: Fall    Mobility  Bed Mobility                  Transfers                    Ambulation/Gait                 Stairs             Wheelchair Mobility    Modified Rankin (Stroke Patients Only)       Balance                                            Cognition Arousal/Alertness: Lethargic Behavior During Therapy: WFL for tasks assessed/performed Overall Cognitive Status: Impaired/Different from baseline Area of Impairment: Orientation;Attention;Following commands;Problem solving                 Orientation Level: Time Current Attention Level: Sustained   Following Commands: Follows one step commands with increased time     Problem Solving: Slow processing;Decreased  initiation;Requires verbal cues;Requires tactile cues General Comments: Patient easily aroused and conversant. Knew he was in [facility] hospital, did not know month, and knew he had COVID=      Exercises General Exercises - Upper Extremity Shoulder Flexion: AAROM;Both;5 reps;Supine Elbow Flexion: AAROM;Both;5 reps;Supine Elbow Extension: AAROM;Both;5 reps;Supine Digit Composite Flexion: Both;AROM;5 reps;Supine Composite Extension: Both;AROM;5 reps;Supine General Exercises - Lower Extremity Ankle Circles/Pumps: Left;PROM;Right;AAROM;5 reps;Supine Heel Slides: AAROM;Both;5 reps;Supine Hip ABduction/ADduction: Left;PROM;Right;AAROM;5 reps;Supine    General Comments General comments (skin integrity, edema, etc.): Wife arrived at end of session and pt even more alert and talkative with her present.       Pertinent Vitals/Pain Pain Assessment: Faces Faces Pain Scale: No hurt    Home Living Family/patient expects to be discharged to:: Skilled nursing facility Living Arrangements: Other (Comment)             Additional Comments: Pt unable to provide information about PLOF and support    Prior Function Level of Independence: Needs assistance  Gait / Transfers Assistance Needed: Per chart has had recent falls at home. Per RN, wife reports pt ambulatory in the house. ADL's / Homemaking Assistance Needed: Needs assistance for spouse - unknown how much assistance. Comments: patient unable to provide history-  information taken from prior charting   PT Goals (current goals can now be found in the care plan section) Acute Rehab PT Goals Patient Stated Goal: agrees wants to get stronger Time For Goal Achievement: 05/26/20 Potential to Achieve Goals: Fair Progress towards PT goals: Progressing toward goals    Frequency    Min 2X/week      PT Plan Current plan remains appropriate    Co-evaluation              AM-PAC PT "6 Clicks" Mobility   Outcome Measure  Help needed  turning from your back to your side while in a flat bed without using bedrails?: Total Help needed moving from lying on your back to sitting on the side of a flat bed without using bedrails?: Total Help needed moving to and from a bed to a chair (including a wheelchair)?: Total Help needed standing up from a chair using your arms (e.g., wheelchair or bedside chair)?: Total Help needed to walk in hospital room?: Total Help needed climbing 3-5 steps with a railing? : Total 6 Click Score: 6    End of Session   Activity Tolerance: Patient tolerated treatment well Patient left: in bed;with call bell/phone within reach;with bed alarm set;with nursing/sitter in room;with family/visitor present   PT Visit Diagnosis: Other abnormalities of gait and mobility (R26.89);Difficulty in walking, not elsewhere classified (R26.2);Muscle weakness (generalized) (M62.81);History of falling (Z91.81)     Time: 2641-5830 PT Time Calculation (min) (ACUTE ONLY): 19 min  Charges:  $Therapeutic Exercise: 8-22 mins                      [name], PT Pager [phone]    Zena Amos 05/16/2020, 5:32 PM

## 2020-05-16 NOTE — Care Management Important Message (Signed)
Important Message  Patient Details  Name: BAYAN KUSHNIR MRN: [id number] Date of Birth: 10/15/1940   Medicare Important Message Given:  Yes - Important Message mailed due to current National Emergency  Verbal consent obtained due to current National Emergency  Relationship to patient: Self Contact Name: Cincere Zorn Call Date: 05/16/20  Time: 1138 Phone: [phone] Outcome: No Answer/Busy Important Message mailed to: Patient address on file    Orson Aloe 05/16/2020, 11:38 AM

## 2020-05-16 NOTE — Progress Notes (Signed)
  Speech Language Pathology Treatment: Dysphagia  Patient Details Name: [name] MRN: [id number] DOB: [birth year] Today's Date: 05/16/2020 Time: 5465-6812 SLP Time Calculation (min) (ACUTE ONLY): 16 min  Assessment / Plan / Recommendation Clinical Impression  [name] was able to remain awake slightly longer than yesterday with less frequent stimulation however not significant to sustain for a meal. His secretion management at baseline is diminished with wet vocal quality and audible phlegm partially mobilized to mouth. Therapist noted and removed dried mucous that had formed on portion of hard palate. Given teaspoon and small cup sips thin and puree he had one delayed cough with source unable to identify with subjective measures. He is not requiring supplemental O2 and Covid incidental finding. His Parkinson's and lethargic state place him at higher aspiration risk and objective instrumental measure of swallow ability is recommended however for reasons aformentioned it is not warranted today. If he can maintain alertness and clear secretions, MBS can be performed. To keep oral cavity moist, prevent dried mucous, allow him to have several sips water and/or ice chips every 5 hours and clean oral cavity prior.   HPI HPI: 79 y.o. male with a history of Parkinson's disease, dementia, increasing falls at home, HTN, covid-19 vaccinations, and BPH who presented to the ED from home after 2 episodes of loss of consciousness. He has had altered mental status since arrival, so history is limited.age-indeterminate infarct of the posterior left cerebellum was noted      SLP Plan  Continue with current plan of care       Recommendations  Diet recommendations: NPO (offer water via spoon, ice chips) Liquids provided via: Teaspoon Medication Administration: Via alternative means Supervision: Full supervision/cueing for compensatory strategies;Trained caregiver to feed patient Postural Changes and/or Swallow  Maneuvers: Seated upright 90 degrees                Oral Care Recommendations: Oral care QID Follow up Recommendations: Skilled Nursing facility SLP Visit Diagnosis: Dysphagia, unspecified (R13.10) Plan: Continue with current plan of care                       [name] 05/16/2020, 1:47 PM [name] [name].Ed Nurse, children's [phone] Office [phone]

## 2020-05-17 ENCOUNTER — Inpatient Hospital Stay (HOSPITAL_COMMUNITY): Payer: Medicare Other

## 2020-05-17 DIAGNOSIS — I10 Essential (primary) hypertension: Secondary | ICD-10-CM | POA: Diagnosis not present

## 2020-05-17 LAB — GLUCOSE, CAPILLARY
Glucose-Capillary: 100 mg/dL — ABNORMAL HIGH (ref 70–99)
Glucose-Capillary: 104 mg/dL — ABNORMAL HIGH (ref 70–99)
Glucose-Capillary: 110 mg/dL — ABNORMAL HIGH (ref 70–99)
Glucose-Capillary: 112 mg/dL — ABNORMAL HIGH (ref 70–99)
Glucose-Capillary: 122 mg/dL — ABNORMAL HIGH (ref 70–99)
Glucose-Capillary: 125 mg/dL — ABNORMAL HIGH (ref 70–99)
Glucose-Capillary: 130 mg/dL — ABNORMAL HIGH (ref 70–99)

## 2020-05-17 LAB — BASIC METABOLIC PANEL
Anion gap: 7 (ref 5–15)
BUN: 29 mg/dL — ABNORMAL HIGH (ref 8–23)
CO2: 23 mmol/L (ref 22–32)
Calcium: 8.4 mg/dL — ABNORMAL LOW (ref 8.9–10.3)
Chloride: 108 mmol/L (ref 98–111)
Creatinine, Ser: 1.24 mg/dL (ref 0.61–1.24)
GFR, Estimated: 59 mL/min — ABNORMAL LOW (ref >60)
Glucose, Bld: 137 mg/dL — ABNORMAL HIGH (ref 70–99)
Potassium: 4.4 mmol/L (ref 3.5–5.1)
Sodium: 138 mmol/L (ref 135–145)

## 2020-05-17 LAB — CBC
HCT: 28.2 % — ABNORMAL LOW (ref 39.0–52.0)
Hemoglobin: 9.6 g/dL — ABNORMAL LOW (ref 13.0–17.0)
MCH: 30.1 pg (ref 26.0–34.0)
MCHC: 34 g/dL (ref 30.0–36.0)
MCV: 88.4 fL (ref 80.0–100.0)
Platelets: 298 10*3/uL (ref 150–400)
RBC: 3.19 MIL/uL — ABNORMAL LOW (ref 4.22–5.81)
RDW: 12 % (ref 11.5–15.5)
WBC: 7.8 10*3/uL (ref 4.0–10.5)
nRBC: 0 % (ref 0.0–0.2)

## 2020-05-17 LAB — PROCALCITONIN: Procalcitonin: 0.28 ng/mL

## 2020-05-17 MED ORDER — ENSURE ENLIVE PO LIQD
237.0000 mL | Freq: Two times a day (BID) | ORAL | Status: DC
  Administered 2020-05-17 – 2020-05-21 (×6): 237 mL via ORAL
  Administered 2020-05-22: 90 mL via ORAL
  Administered 2020-05-26 – 2020-05-31 (×10): 237 mL via ORAL

## 2020-05-17 MED ORDER — PROSOURCE TF PO LIQD
90.0000 mL | Freq: Two times a day (BID) | ORAL | Status: DC
  Administered 2020-05-17 – 2020-05-29 (×25): 90 mL
  Filled 2020-05-17 (×30): qty 90

## 2020-05-17 MED ORDER — OSMOLITE 1.5 CAL PO LIQD
960.0000 mL | ORAL | Status: DC
  Administered 2020-05-17 – 2020-05-27 (×11): 960 mL
  Filled 2020-05-17 (×12): qty 1000

## 2020-05-17 NOTE — Progress Notes (Signed)
Nutrition Follow-up  DOCUMENTATION CODES:   Not applicable  INTERVENTION:  Provide Ensure Enlive po BID, each supplement provides 350 kcal and 20 grams of protein.  RD to transition to nocturnal tube feeds to aid in encouragement of PO intake during the day: Initiate nocturnal tube feeds via Cortrak NGT using Osmolite 1.5 formula at goal rate of 80 ml/hr x 12 hours (7pm-7am).  Provide 90 ml Prosource TF BID per tube.   Continue 155 ml free water q 4 hours per tube. (MD may adjust as appropriate)   Tube feeding regimen provides 1600 kcal (80% of kcal needs), 104 grams of protein (100% of protein needs), 1660 ml free water.   NUTRITION DIAGNOSIS:   Inadequate oral intake related to lethargy/confusion, inability to eat as evidenced by NPO status; diet advanced; progressing  GOAL:   Patient will meet greater than or equal to 90% of their needs; progressing  MONITOR:   Diet advancement, Labs, Weight trends, TF tolerance, Skin, I & O's  REASON FOR ASSESSMENT:   Rounds    ASSESSMENT:   79 y.o. male with medical history significant of BPH, venous insufficiency, CKD, Parkinson's dementia, hypertension, falls, neutropenia, thrombocytopenia, recent admission (from 11/10-11/16 due to altered mental status, findings of old CVA, syncope, incidental Covid infection) who presents from his rehab facility with altered mental status. Pt admitted with acute encephalopathy, Parkinson's dementia, and incidental finding of COVID-19 infection.   Pt underwent MBS today. Diet has been advanced to a dysphagia 1 diet with thin liquids. Cortrak NGT continues to be in place. RD to modify tube feeding orders and transition to nocturnal tube feeds to encourage PO intake during the day while still providing adequate nutrition. RD to order Ensure to aid in PO intake.   Labs and medications reviewed.   Diet Order:   Diet Order            DIET - DYS 1 Room service appropriate? No; Fluid consistency: Thin   Diet effective now                 EDUCATION NEEDS:   No education needs have been identified at this time  Skin:  Skin Assessment: Reviewed RN Assessment  Last BM:  11/23  Height:   Ht Readings from Last 1 Encounters:  05/12/20 5\' 8"  (1.727 m)    Weight:   Wt Readings from Last 1 Encounters:  05/17/20 82.4 kg    Ideal Body Weight:  70 kg  BMI:  Body mass index is 27.62 kg/m.  Estimated Nutritional Needs:   Kcal:  2000-2200  Protein:  100-115 grams  Fluid:  > 2 L  05/19/20, MS, RD, LDN RD pager number/after hours weekend pager number on Amion.

## 2020-05-17 NOTE — Progress Notes (Signed)
Modified Barium Swallow Progress Note  Patient Details  Name: [name] MRN: [id number] Date of Birth: [birth year]  Today's Date: 05/17/2020  Modified Barium Swallow completed.  Full report located under Chart Review in the Imaging Section.  Brief recommendations include the following:  Clinical Impression  Pt adequately awake for MBS however drowsy and required intermittent verbal/tactile stimulation. Oropharyngeal dysphagia was minimal without laryngeal penetration or aspiration observed. Orally, there was delayed propulsion with decreased cohesion, sublingual spill and lingual residue. Mild-mod vallecular residue with thin which reached pyriform sinuses with straw perhaps due to drowsiness. He did not have dentures donned and states they are in "all the time" therefore, puree (Dys 1) texutre recommended in addition to inconsistent alertness. Prognosis to return to regular is good. Pills whole in puree if alert.     Swallow Evaluation Recommendations       SLP Diet Recommendations: Dysphagia 1 (Puree) solids;Thin liquid   Liquid Administration via: Cup;Straw   Medication Administration: Whole meds with puree   Supervision: Full assist for feeding;Staff to assist with self feeding   Compensations: Slow rate;Small sips/bites   Postural Changes: Seated upright at 90 degrees   Oral Care Recommendations: Oral care BID        [name] 05/17/2020,3:29 PM  [name] [name].Ed Nurse, children's [phone] Office [phone]

## 2020-05-17 NOTE — Progress Notes (Signed)
PROGRESS NOTE    [name]  ZOX:[id number] DOB: [birth year] DOA: 05/10/2020 PCP: [name], MD  Brief Narrative-79 y.o.malewith medical history significant ofBPH, venous insufficiency, CKD, Parkinson's dementia, hypertension, falls, neutropenia, thrombocytopenia, recent admission(from 11/10-11/16 due to altered mental status, findings of old CVA, syncope, incidental Covid infection)who presents from his rehab facility with altered mental status, appears to be having fluctuating mental status, neurology work-up this admission is as significant including MRI, neurology were consulted, and they reported this is expected fluctuation if his progressive Parkinson disease and dementia, and recommendation is for him to continue his Parkinson meds, so core track tube was inserted, mentation has significantly improved, initially obtunded and unresponsive, currently communicative, but remains unsafe to swallow, on 11/20 2 AM patient was febrile, chest x-ray significant for pneumonia, bacterial, aspiration for which she is started on antibiotics.  Assessment & Plan:   Principal Problem:   Acute encephalopathy Active Problems:   Essential hypertension   Chronic venous insufficiency   Dementia due to Parkinson's disease without behavioral disturbance (HCC)   Parkinson's syndrome (HCC)   History of CVA (cerebrovascular accident)  #1 acute encephalopathy in the setting of Parkinson's disease and dementia.  Patient was recently admitted with similar complaints had extensive work-up including MRI of the brain echo EEG carotid ultrasound TEE.  A1c was normal LDL was 90.  He was started on statin and aspirin.  This admission CT of the head stable UA normal.  He was seen by neurology.  He has fluctuating mental status given it is natural progression of his illness.  Parkinson's medications are being continued through the core track tube.  Patient did have modified barium swallow today.  #2   pneumonia HCAP versus aspiration chest x-ray with bibasilar infiltrates.  On cefepime and Flagyl.  #3 history of stroke continue aspirin  #4 history of CKD stage III with AKI in the setting of decreased p.o. intake.  Improving with IV fluids.  His baseline creatinine is 1.5.  Creatinine today is  #5 Parkinson's disease with dementia followed by Dr. Karel Jarvis  #6 hypokalemia/hypophosphatemia  #7 bilateral upper extremity edema Doppler shows no evidence of DVT.  #8 COVID-19 infection patient received monoclonal antibody 05/04/2020.  He also finished 10 days of isolation.       Nutrition Problem: Inadequate oral intake Etiology: lethargy/confusion, inability to eat     Signs/Symptoms: NPO status    Interventions: Tube feeding, Prostat  Estimated body mass index is 27.62 kg/m as calculated from the following:   Height as of this encounter:  (1.727 m).   Weight as of this encounter: 82.4 kg.  DVT prophylaxis: Lovenox  code Status: Full code Family Communication: Discussed with patient's wife and daughter Disposition Plan:  Status is: Inpatient   Dispo: The patient is from: SNF              Anticipated d/c is to: SNF              Anticipated d/c date is: 2 to 3 days              Patient currently is not medically stable to d/c.   Consultants:  Neurology saw her on 05/12/2020 Procedures: None Antimicrobials:  Anti-infectives (From admission, onward)   Start     Dose/Rate Route Frequency Ordered Stop   05/15/20 0815  ceFEPIme (MAXIPIME) 2 g in sodium chloride 0.9 % 100 mL IVPB        2 g 200 mL/hr over 30  Minutes Intravenous Every 12 hours 05/15/20 0725     05/15/20 0800  metroNIDAZOLE (FLAGYL) 50 mg/ml oral suspension 500 mg        500 mg Per Tube 3 times daily 05/15/20 0708         Subjective: Patient is resting in bed he is awake and he is answered all my questions appropriately. Core track tube in place.  Objective: Vitals:   05/17/20 0105 05/17/20  0436 05/17/20 0800 05/17/20 1302  BP: (!) 147/69 140/63 138/67 (!) 123/59  Pulse: 74 74 75 69  Resp: 19 19 18 19   Temp: 98.8 F (37.1 C) 98.4 F (36.9 C)  98.7 F (37.1 C)  TempSrc: Oral Oral  Oral  SpO2: 100% 100% 97% 99%  Weight:  82.4 kg    Height:        Intake/Output Summary (Last 24 hours) at 05/17/2020 1325 Last data filed at 05/17/2020 1130 Gross per 24 hour  Intake 2802.46 ml  Output 600 ml  Net 2202.46 ml   Filed Weights   05/15/20 0404 05/16/20 0357 05/17/20 0436  Weight: 80.7 kg 81.4 kg 82.4 kg    Examination:  General exam: Appears chronically ill-appearing frail and deconditioned. Respiratory system: Diminished breath sounds at the bases with scattered rhonchi to auscultation. Respiratory effort normal. Cardiovascular system: S1 & S2 heard, RRR. No JVD, murmurs, rubs, gallops or clicks. No pedal edema. Gastrointestinal system: Abdomen is nondistended, soft and nontender. No organomegaly or masses felt. Normal bowel sounds heard. Central nervous system: Alert and oriented. No focal neurological deficits. Extremities: Symmetric 5 x 5 power. Skin: No rashes, lesions or ulcers Psychiatry: Judgement and insight appear normal. Mood & affect appropriate.     Data Reviewed: I have personally reviewed following labs and imaging studies  CBC: Recent Labs  Lab 05/10/20 1715 05/11/20 0316 05/13/20 0746 05/14/20 0847 05/15/20 0913 05/16/20 0052 05/17/20 0416  WBC 6.9   < > 5.6 5.9 12.6* 9.0 7.8  NEUTROABS 4.9  --   --   --   --   --   --   HGB 10.8*   < > 10.4* 10.8* 9.5* 9.2* 9.6*  HCT 31.2*   < > 30.5* 30.7* 28.6* 27.5* 28.2*  MCV 86.7   < > 87.4 88.5 89.1 89.6 88.4  PLT 174   < > 294 360 282 242 298   < > = values in this interval not displayed.   Basic Metabolic Panel: Recent Labs  Lab 05/12/20 0124 05/12/20 1506 05/13/20 0746 05/13/20 1828 05/14/20 0847 05/15/20 0913 05/16/20 0052 05/17/20 0416  NA   < >  --  142  --  141 140 140 138  K   <  >  --  3.6  --  4.0 4.1 4.0 4.4  CL   < >  --  114*  --  114* 114* 109 108  CO2   < >  --  21*  --  20* 20* 23 23  GLUCOSE   < >  --  165*  --  139* 139* 142* 137*  BUN   < >  --  39*  --  38* 35* 34* 29*  CREATININE   < >  --  1.62*  --  1.54* 1.52* 1.31* 1.24  CALCIUM   < >  --  8.6*  --  8.5* 8.5* 8.2* 8.4*  MG  --  1.9 1.9 2.0  --   --   --   --  PHOS   < > 2.5 2.0* 2.0* 2.4* 1.9* 2.1*  --    < > = values in this interval not displayed.   GFR: Estimated Creatinine Clearance: 49.7 mL/min (by C-G formula based on SCr of 1.24 mg/dL). Liver Function Tests: Recent Labs  Lab 05/11/20 0316 05/12/20 0124 05/14/20 0847 05/15/20 0913 05/16/20 0052  AST 19 17 13* 10* 8*  ALT 6 11 11 11 7   ALKPHOS 48 46 43 46 42  BILITOT 1.0 1.1 0.6 0.9 0.6  PROT 5.5* 5.4* 5.0* 5.0* 4.6*  ALBUMIN 2.7* 2.6* 2.4* 2.3* 2.0*   No results for input(s): LIPASE, AMYLASE in the last 168 hours. Recent Labs  Lab 05/12/20 0134 05/15/20 0847  AMMONIA 19 28   Coagulation Profile: No results for input(s): INR, PROTIME in the last 168 hours. Cardiac Enzymes: Recent Labs  Lab 05/10/20 1726  CKTOTAL 261   BNP (last 3 results) No results for input(s): PROBNP in the last 8760 hours. HbA1C: No results for input(s): HGBA1C in the last 72 hours. CBG: Recent Labs  Lab 05/16/20 2016 05/17/20 0013 05/17/20 0404 05/17/20 0830 05/17/20 1258  GLUCAP 132* 110* 125* 130* 112*   Lipid Profile: No results for input(s): CHOL, HDL, LDLCALC, TRIG, CHOLHDL, LDLDIRECT in the last 72 hours. Thyroid Function Tests: No results for input(s): TSH, T4TOTAL, FREET4, T3FREE, THYROIDAB in the last 72 hours. Anemia Panel: No results for input(s): VITAMINB12, FOLATE, FERRITIN, TIBC, IRON, RETICCTPCT in the last 72 hours. Sepsis Labs: Recent Labs  Lab 05/15/20 0913 05/16/20 0052 05/17/20 0416  PROCALCITON 0.43 0.50 0.28    Recent Results (from the past 240 hour(s))  Respiratory Panel by RT PCR (Flu A&B, Covid) -  Nasopharyngeal Swab     Status: Abnormal   Collection Time: 05/10/20  9:50 PM   Specimen: Nasopharyngeal Swab  Result Value Ref Range Status   SARS Coronavirus 2 by RT PCR POSITIVE (A) NEGATIVE Final    Comment: RESULT CALLED TO, READ BACK BY AND VERIFIED WITH: 05/12/20 RN 05/11/20 AT 0002 SK (NOTE) SARS-CoV-2 target nucleic acids are DETECTED.  SARS-CoV-2 RNA is generally detectable in upper respiratory specimens  during the acute phase of infection. Positive results are indicative of the presence of the identified virus, but do not rule out bacterial infection or co-infection with other pathogens not detected by the test. Clinical correlation with patient history and other diagnostic information is necessary to determine patient infection status. The expected result is Negative.  Fact Sheet for Patients:  05/13/20  Fact Sheet for Healthcare Providers: https://www.moore.com/  This test is not yet approved or cleared by the https://www.young.biz/ FDA and  has been authorized for detection and/or diagnosis of SARS-CoV-2 by FDA under an Emergency Use Authorization (EUA).  This EUA will remain in effect (meaning this test can be [place] ed) for the duration of  the COVID-19 declaration under Section 564(b)(1) of the Act, 21 U.S.C. section 360bbb-3(b)(1), unless the authorization is terminated or revoked sooner.      Influenza A by PCR NEGATIVE NEGATIVE Final   Influenza B by PCR NEGATIVE NEGATIVE Final    Comment: (NOTE) The Xpert Xpress SARS-CoV-2/FLU/RSV assay is intended as an aid in  the diagnosis of influenza from Nasopharyngeal swab specimens and  should not be used as a sole basis for treatment. Nasal washings and  aspirates are unacceptable for Xpert Xpress SARS-CoV-2/FLU/RSV  testing.  Fact Sheet for Patients: [place]  Fact Sheet for Healthcare  Providers: https://www.moore.com/  This test is  not yet approved or cleared by the [place] FDA and  has been authorized for detection and/or diagnosis of SARS-CoV-2 by  FDA under an Emergency Use Authorization (EUA). This EUA will remain  in effect (meaning this test can be used) for the duration of the  Covid-19 declaration under Section 564(b)(1) of the Act, 21  U.S.C. section 360bbb-3(b)(1), unless the authorization is  terminated or revoked. Performed at [facility], [address] [place], [place] [address]   Culture, blood (routine x 2)     Status: None (Preliminary result)   Collection Time: 05/15/20  9:13 AM   Specimen: BLOOD  Result Value Ref Range Status   Specimen Description BLOOD LEFT ANTECUBITAL  Final   Special Requests   Final    BOTTLES DRAWN AEROBIC AND ANAEROBIC Blood Culture results may not be optimal due to an inadequate volume of blood received in culture bottles   Culture   Final    NO GROWTH 2 DAYS Performed at [facility], [address] [place], [place] [address]    Report Status PENDING  Incomplete  Culture, blood (routine x 2)     Status: None (Preliminary result)   Collection Time: 05/15/20  9:13 AM   Specimen: BLOOD  Result Value Ref Range Status   Specimen Description BLOOD BLOOD LEFT HAND  Final   Special Requests   Final    BOTTLES DRAWN AEROBIC AND ANAEROBIC Blood Culture results may not be optimal due to an inadequate volume of blood received in culture bottles   Culture   Final    NO GROWTH 2 DAYS Performed at [facility], [address] [place], [place] [address]    Report Status PENDING  Incomplete         Radiology Studies: VAS US UPPER EXTREMITY VENOUS DUPLEX  Result Date: 05/16/2020 UPPER VENOUS STUDY  Indications: Edema Comparison Study: No prior studies. Performing Technologist: Jean Rosenthalachel Hodge  Examination Guidelines: A complete evaluation includes B-mode imaging, spectral Doppler,  color Doppler, and power Doppler as needed of all accessible portions of each vessel. Bilateral testing is considered an integral part of a complete examination. Limited examinations for reoccurring indications may be performed as noted.  Right Findings: +----------+------------+---------+-----------+----------+-------+ RIGHT     CompressiblePhasicitySpontaneousPropertiesSummary +----------+------------+---------+-----------+----------+-------+ Subclavian    Full       Yes       Yes                      +----------+------------+---------+-----------+----------+-------+  Left Findings: +----------+------------+---------+-----------+----------+-------+ LEFT      CompressiblePhasicitySpontaneousPropertiesSummary +----------+------------+---------+-----------+----------+-------+ IJV           Full       Yes       Yes                      +----------+------------+---------+-----------+----------+-------+ Subclavian    Full       Yes       Yes                      +----------+------------+---------+-----------+----------+-------+ Axillary      Full       Yes       Yes                      +----------+------------+---------+-----------+----------+-------+ Brachial      Full                                          +----------+------------+---------+-----------+----------+-------+  Radial        Full                                          +----------+------------+---------+-----------+----------+-------+ Ulnar         Full                                          +----------+------------+---------+-----------+----------+-------+ Cephalic      Full                                          +----------+------------+---------+-----------+----------+-------+ Basilic       Full                                          +----------+------------+---------+-----------+----------+-------+  Summary:  Right: No evidence of thrombosis in the subclavian.  Left:  No evidence of deep vein thrombosis in the upper extremity. No evidence of superficial vein thrombosis in the upper extremity.  *See table(s) above for measurements and observations.  Diagnosing physician: Waverly Ferrari MD Electronically signed by Waverly Ferrari MD on 05/16/2020 at 5:58:51 PM.    Final         Scheduled Meds: . aspirin  81 mg Per Tube Daily  . carbidopa-levodopa  1 tablet Per Tube TID WC  . carvedilol  6.25 mg Per Tube BID WC  . cholecalciferol  1,000 Units Per Tube Daily  . enoxaparin (LOVENOX) injection  40 mg Subcutaneous Daily  . feeding supplement (PROSource TF)  45 mL Per Tube BID  . free water  155 mL Per Tube Q4H  . metroNIDAZOLE  500 mg Per Tube TID  . phosphorus  500 mg Per Tube BID  . rivastigmine  1.5 mg Per Tube BID WC  . sodium chloride flush  3 mL Intravenous Q12H  . vitamin B-12  1,000 mcg Per Tube Daily   Continuous Infusions: . ceFEPime (MAXIPIME) IV 2 g (05/17/20 1053)  . dextrose 5 % and 0.9 % NaCl with KCl 20 mEq/L 50 mL/hr at 05/17/20 0636  . feeding supplement (OSMOLITE 1.5 CAL) 1,000 mL (05/17/20 0927)     LOS: 7 days     Alwyn Ren, MD  05/17/2020, 1:25 PM

## 2020-05-17 NOTE — Progress Notes (Signed)
  Speech Language Pathology  Patient Details Name: [name] MRN: [id number] DOB: 1941/04/01 Today's Date: 05/17/2020 Time:  -     MBS scheduled today at 1330                GO                Almond Fitzgibbon, [name] 05/17/2020, 11:13 AM [name] [name].Ed Nurse, children's [phone] Office [phone]

## 2020-05-18 DIAGNOSIS — I10 Essential (primary) hypertension: Secondary | ICD-10-CM | POA: Diagnosis not present

## 2020-05-18 LAB — GLUCOSE, CAPILLARY
Glucose-Capillary: 124 mg/dL — ABNORMAL HIGH (ref 70–99)
Glucose-Capillary: 120 mg/dL — ABNORMAL HIGH (ref 70–99)
Glucose-Capillary: 92 mg/dL (ref 70–99)
Glucose-Capillary: 105 mg/dL — ABNORMAL HIGH (ref 70–99)
Glucose-Capillary: 106 mg/dL — ABNORMAL HIGH (ref 70–99)
Glucose-Capillary: 103 mg/dL — ABNORMAL HIGH (ref 70–99)

## 2020-05-18 NOTE — Progress Notes (Deleted)
Pharmacy Antibiotic Note  [name] is a 79 y.o. male admitted on 05/10/2020 with pneumonia.  Pharmacy has been consulted for cefepime dosing. Pt is also on flagyl for HAP coverage. Today is day 7 of both therapies.  Pt WBC WNL and remains afebrile. CXR from today shows no airspace opacities (improved).    Plan: Consider discontinuation of cefepime/flagyl today (day 7 of treatment) Doses are appropriate for renal function Monitor renal function, clinical progress   Height: 5\' 8"  (172.7 cm) Weight: 89.6 kg (197 lb 8.5 oz) IBW/kg (Calculated) : 68.4  Temp (24hrs), Avg:98.3 F (36.8 C), Min:97.6 F (36.4 C), Max:98.7 F (37.1 C)  Recent Labs  Lab 05/13/20 0746 05/14/20 0847 05/15/20 0913 05/16/20 0052 05/17/20 0416  WBC 5.6 5.9 12.6* 9.0 7.8  CREATININE 1.62* 1.54* 1.52* 1.31* 1.24    Estimated Creatinine Clearance: 51.7 mL/min (by C-G formula based on SCr of 1.24 mg/dL).    Allergies  Allergen Reactions  . Amlodipine Other (See Comments)    Edema w/10 mg  . Lorazepam Other (See Comments)    DO NOT GIVE BENZO TO [facility] PT WITH CONCERNS FOR LEWY BODY DEMENTIA, CAUSES PARADOXICAL WORSENING OF THEIR AGITATION!  . Penicillins Hives    Has patient had a PCN reaction causing immediate rash, facial/tongue/throat swelling, SOB or lightheadedness with hypotension: Yes Has patient had a PCN reaction causing severe rash involving mucus membranes or skin necrosis: No Has patient had a PCN reaction that required hospitalization No Has patient had a PCN reaction occurring within the last 10 years: No If all of the above answers are "NO", then may proceed with Cephalosporin use.   . Sulfonamide Derivatives Hives  . Atorvastatin     REACTION: leg cramps  . Sulfa Antibiotics Other (See Comments) and Palpitations    Sweating, passed out  . Telmisartan Other (See Comments)    ?fatigue   05/19/20, PharmD PGY1 Pharmacy Resident 05/21/2020 10:08 AM  Please check  [email or web address] for unit-specific pharmacy phone numbers.

## 2020-05-18 NOTE — Progress Notes (Signed)
PROGRESS NOTE    [name]  BJS:[id number] DOB: [birth year] DOA: 05/10/2020 PCP: [name], MD  Brief Narrative-79 y.o.malewith medical history significant ofBPH, venous insufficiency, CKD, Parkinson's dementia, hypertension, falls, neutropenia, thrombocytopenia, recent admission(from 11/10-11/16 due to altered mental status, findings of old CVA, syncope, incidental Covid infection)who presents from his rehab facility with altered mental status, appears to be having fluctuating mental status, neurology work-up this admission is as significant including MRI, neurology were consulted, and they reported this is expected fluctuation if his progressive Parkinson disease and dementia, and recommendation is for him to continue his Parkinson meds, so core track tube was inserted, mentation has significantly improved, initially obtunded and unresponsive, currently communicative, but remains unsafe to swallow, on 11/20 2 AM patient was febrile, chest x-ray significant for pneumonia, bacterial, aspiration for which she is started on antibiotics.  Assessment & Plan:   Principal Problem:   Acute encephalopathy Active Problems:   Essential hypertension   Chronic venous insufficiency   Dementia due to Parkinson's disease without behavioral disturbance (HCC)   Parkinson's syndrome (HCC)   History of CVA (cerebrovascular accident)  #1 acute encephalopathy in the setting of Parkinson's disease and dementia.  Patient was recently admitted with similar complaints had extensive work-up including MRI of the brain echo EEG carotid ultrasound TEE.  A1c was normal LDL was 90.  He was started on statin and aspirin.  This admission CT of the head stable UA normal.  He was seen by neurology.  He has fluctuating mental status given it is natural progression of his illness.  Parkinson's medications are being continued through the core track tube.  Patient did have modified barium swallow today.  #2   pneumonia HCAP versus aspiration chest x-ray with bibasilar infiltrates.  On cefepime and Flagyl.  #3 history of stroke continue aspirin  #4 history of CKD stage III with AKI in the setting of decreased p.o. intake.  Improving with IV fluids.  His baseline creatinine is 1.5.  Creatinine is 1.24  #5 Parkinson's disease with dementia followed by Dr. Karel Jarvis  #6 hypokalemia/hypophosphatemia-repleted  #7 bilateral upper extremity edema Doppler shows no evidence of DVT.  #8 COVID-19 infection patient received monoclonal antibody 05/04/2020.  He also finished 10 days of isolation.  #9 FEN patient still has core track tube in place.  He was started on dysphagia 1 diet 05/17/2020.  Hoping he can get enough calories we may be able to take off the core track tube and send him to rehab.   Nutrition Problem: Inadequate oral intake Etiology: lethargy/confusion, inability to eat     Signs/Symptoms: NPO status    Interventions: Tube feeding, Prostat  Estimated body mass index is 30.03 kg/m as calculated from the following:   Height as of this encounter: 5\' 8"  (1.727 m).   Weight as of this encounter: 89.6 kg.  DVT prophylaxis: Lovenox  code Status: Full code Family Communication: Discussed with patient's wife  Disposition Plan:  Status is: Inpatient   Dispo: The patient is from: SNF              Anticipated d/c is to: SNF              Anticipated d/c date is: 2 to 3 days              Patient currently is not medically stable to d/c.   Consultants:  Neurology saw her on 05/12/2020 Procedures: None Antimicrobials:  Anti-infectives (From admission, onward)   Start  Dose/Rate Route Frequency Ordered Stop   05/15/20 0815  ceFEPIme (MAXIPIME) 2 g in sodium chloride 0.9 % 100 mL IVPB        2 g 200 mL/hr over 30 Minutes Intravenous Every 12 hours 05/15/20 0725     05/15/20 0800  metroNIDAZOLE (FLAGYL) 50 mg/ml oral suspension 500 mg        500 mg Per Tube 3 times daily 05/15/20  0708         Subjective: Patient resting in bed he is appropriate able to answer questions his right upper extremity was cool to touch yesterday he did have pulses.  Today his right upper extremity is warm to touch #2 fluid-filled blisters noted one is draining the other one is still intact Objective: Vitals:   05/17/20 0800 05/17/20 1302 05/17/20 2142 05/18/20 0500  BP: 138/67 (!) 123/59 (!) 121/56 136/61  Pulse: 75 69 67 71  Resp: Temp:  98.7 F (37.1 C) 98.7 F (37.1 C) 97.6 F (36.4 C)  TempSrc:  Oral Oral Axillary  SpO2: 97% 99% 99% 97%  Weight:    89.6 kg  Height:        Intake/Output Summary (Last 24 hours) at 05/18/2020 1125 Last data filed at 05/18/2020 0555 Gross per 24 hour  Intake 1017.17 ml  Output 850 ml  Net 167.17 ml   Filed Weights   05/16/20 0357 05/17/20 0436 05/18/20 0500  Weight: 81.4 kg 82.4 kg 89.6 kg    Examination:  General exam: Appears chronically ill-appearing frail and deconditioned. Respiratory system: Diminished breath sounds at the bases with scattered rhonchi to auscultation. Respiratory effort normal. Cardiovascular system: S1 & S2 heard, RRR. No JVD, murmurs, rubs, gallops or clicks. No pedal edema. Gastrointestinal system: Abdomen is nondistended, soft and nontender. No organomegaly or masses felt. Normal bowel sounds heard. Central nervous system: Alert and oriented. No focal neurological deficits. Extremities: 2+ right upper extremity edema 1+ left upper extremity edema and 1+ bilateral lower extremity edema Skin: No rashes, lesions or ulcers Psychiatry: Judgement and insight appear normal. Mood & affect appropriate.     Data Reviewed: I have personally reviewed following labs and imaging studies  CBC: Recent Labs  Lab 05/13/20 0746 05/14/20 0847 05/15/20 0913 05/16/20 0052 05/17/20 0416  WBC 5.6 5.9 12.6* 9.0 7.8  HGB 10.4* 10.8* 9.5* 9.2* 9.6*  HCT 30.5* 30.7* 28.6* 27.5* 28.2*  MCV 87.4 88.5 89.1 89.6  88.4  PLT 294 360 282 242 298   Basic Metabolic Panel: Recent Labs  Lab 05/12/20 0124 05/12/20 1506 05/13/20 0746 05/13/20 1828 05/14/20 0847 05/15/20 0913 05/16/20 0052 05/17/20 0416  NA   < >  --  142  --  141 140 140 138  K   < >  --  3.6  --  4.0 4.1 4.0 4.4  CL   < >  --  114*  --  114* 114* 109 108  CO2   < >  --  21*  --  20* 20* 23 23  GLUCOSE   < >  --  165*  --  139* 139* 142* 137*  BUN   < >  --  39*  --  38* 35* 34* 29*  CREATININE   < >  --  1.62*  --  1.54* 1.52* 1.31* 1.24  CALCIUM   < >  --  8.6*  --  8.5* 8.5* 8.2* 8.4*  MG  --  1.9 1.9 2.0  --   --   --   --  PHOS   < > 2.5 2.0* 2.0* 2.4* 1.9* 2.1*  --    < > = values in this interval not displayed.   GFR: Estimated Creatinine Clearance: 51.7 mL/min (by C-G formula based on SCr of 1.24 mg/dL). Liver Function Tests: Recent Labs  Lab 05/12/20 0124 05/14/20 0847 05/15/20 0913 05/16/20 0052  AST 17 13* 10* 8*  ALT 11 11 11 7   ALKPHOS 46 43 46 42  BILITOT 1.1 0.6 0.9 0.6  PROT 5.4* 5.0* 5.0* 4.6*  ALBUMIN 2.6* 2.4* 2.3* 2.0*   No results for input(s): LIPASE, AMYLASE in the last 168 hours. Recent Labs  Lab 05/12/20 0134 05/15/20 0847  AMMONIA 19 28   Coagulation Profile: No results for input(s): INR, PROTIME in the last 168 hours. Cardiac Enzymes: No results for input(s): CKTOTAL, CKMB, CKMBINDEX, TROPONINI in the last 168 hours. BNP (last 3 results) No results for input(s): PROBNP in the last 8760 hours. HbA1C: No results for input(s): HGBA1C in the last 72 hours. CBG: Recent Labs  Lab 05/17/20 1634 05/17/20 1931 05/17/20 2313 05/18/20 0329 05/18/20 0805  GLUCAP 104* 100* 122* 124* 120*   Lipid Profile: No results for input(s): CHOL, HDL, LDLCALC, TRIG, CHOLHDL, LDLDIRECT in the last 72 hours. Thyroid Function Tests: No results for input(s): TSH, T4TOTAL, FREET4, T3FREE, THYROIDAB in the last 72 hours. Anemia Panel: No results for input(s): VITAMINB12, FOLATE, FERRITIN, TIBC,  IRON, RETICCTPCT in the last 72 hours. Sepsis Labs: Recent Labs  Lab 05/15/20 0913 05/16/20 0052 05/17/20 0416  PROCALCITON 0.43 0.50 0.28    Recent Results (from the past 240 hour(s))  Respiratory Panel by RT PCR (Flu A&B, Covid) - Nasopharyngeal Swab     Status: Abnormal   Collection Time: 05/10/20  9:50 PM   Specimen: Nasopharyngeal Swab  Result Value Ref Range Status   SARS Coronavirus 2 by RT PCR POSITIVE (A) NEGATIVE Final    Comment: RESULT CALLED TO, READ BACK BY AND VERIFIED WITH: 05/12/20 RN 05/11/20 AT 0002 SK (NOTE) SARS-CoV-2 target nucleic acids are DETECTED.  SARS-CoV-2 RNA is generally detectable in upper respiratory specimens  during the acute phase of infection. Positive results are indicative of the presence of the identified virus, but do not rule out bacterial infection or co-infection with other pathogens not detected by the test. Clinical correlation with patient history and other diagnostic information is necessary to determine patient infection status. The expected result is Negative.  Fact Sheet for Patients:  05/13/20  Fact Sheet for Healthcare Providers: https://www.moore.com/  This test is not yet approved or cleared by the https://www.young.biz/ FDA and  has been authorized for detection and/or diagnosis of SARS-CoV-2 by FDA under an Emergency Use Authorization (EUA).  This EUA will remain in effect (meaning this test can be [place] ed) for the duration of  the COVID-19 declaration under Section 564(b)(1) of the Act, 21 U.S.C. section 360bbb-3(b)(1), unless the authorization is terminated or revoked sooner.      Influenza A by PCR NEGATIVE NEGATIVE Final   Influenza B by PCR NEGATIVE NEGATIVE Final    Comment: (NOTE) The Xpert Xpress SARS-CoV-2/FLU/RSV assay is intended as an aid in  the diagnosis of influenza from Nasopharyngeal swab specimens and  should not be used as a sole basis for treatment.  Nasal washings and  aspirates are unacceptable for Xpert Xpress SARS-CoV-2/FLU/RSV  testing.  Fact Sheet for Patients: [place]  Fact Sheet for Healthcare Providers: https://www.moore.com/  This test is not yet approved or cleared by  the [employer] and  has been authorized for detection and/or diagnosis of SARS-CoV-2 by  FDA under an Emergency Use Authorization (EUA). This EUA will remain  in effect (meaning this test can be used) for the duration of the  Covid-19 declaration under Section 564(b)(1) of the Act, 21  U.S.C. section 360bbb-3(b)(1), unless the authorization is  terminated or revoked. Performed at [facility], [address], [place], [place] [address]   Culture, blood (routine x 2)     Status: None (Preliminary result)   Collection Time: 05/15/20  9:13 AM   Specimen: BLOOD  Result Value Ref Range Status   Specimen Description BLOOD LEFT ANTECUBITAL  Final   Special Requests   Final    BOTTLES DRAWN AEROBIC AND ANAEROBIC Blood Culture results may not be optimal due to an inadequate volume of blood received in culture bottles   Culture   Final    NO GROWTH 3 DAYS Performed at [facility], [address] [place], [place] [address]    Report Status PENDING  Incomplete  Culture, blood (routine x 2)     Status: None (Preliminary result)   Collection Time: 05/15/20  9:13 AM   Specimen: BLOOD  Result Value Ref Range Status   Specimen Description BLOOD BLOOD LEFT HAND  Final   Special Requests   Final    BOTTLES DRAWN AEROBIC AND ANAEROBIC Blood Culture results may not be optimal due to an inadequate volume of blood received in culture bottles   Culture   Final    NO GROWTH 3 DAYS Performed at [facility], [address] [place], [place] [address]    Report Status PENDING  Incomplete         Radiology Studies: DG Swallowing Func-Speech Pathology  Result Date:  05/17/2020 Objective Swallowing Evaluation: Type of Study: MBS-Modified Barium Swallow Study  Patient Details Name: NUEL DEJAYNES MRN: [id number] Date of Birth: January 02, 1941 Today's Date: 05/17/2020 Time: SLP Start Time (ACUTE ONLY): 1329 -SLP Stop Time (ACUTE ONLY): 1349 SLP Time Calculation (min) (ACUTE ONLY): 20 min Past Medical History: Past Medical History: Diagnosis Date . B12 deficiency 11/19/2018 . Benign prostatic hypertrophy  . Edema  . Hyperlipidemia  . Hypertension  . Insomnia  . Parasomnia 10/14/2011  PSG 12/15/11>>AHI 0.4, SpO2 low 89%, PLMI 16, somnoliloquay, nocturia  . Prediabetes 06/10/2012 . Seasonal allergies  . Sepsis (HCC) 01/2019 Past Surgical History: Past Surgical History: Procedure Laterality Date . CYSTOSCOPY/URETEROSCOPY/HOLMIUM LASER/STENT PLACEMENT Left 07/05/2019  Procedure: CYSTOSCOPY/URETEROSCOPY/STENT PLACEMENT/REROGRADE;  Surgeon: Crista Elliot, MD;  Location: [facility];  Service: Urology;  Laterality: Left; . FINGER SURGERY Right 04/19/2014  RIGHT INDEX & RING FINGER   FROM DOG BITE  . I & D EXTREMITY Right 04/20/2014  Procedure: IRRIGATION AND DEBRIDEMENT right index finger and right ring finger;  Surgeon: Dominica Severin, MD;  Location: [facility] OR;  Service: Orthopedics;  Laterality: Right; . I & D EXTREMITY Right 04/21/2014  Procedure: IRRIGATION AND DEBRIDEMENT RIGHT HAND RING FINGER, INDEX FINGER AND Flexor Tenolysis and FDS Tenotomy;  Surgeon: Dominica Severin, MD;  Location: [facility] OR;  Service: Orthopedics;  Laterality: Right; HPI: 79 y.o. male with a history of Parkinson's disease, dementia, increasing falls at home, HTN, covid-19 vaccinations, and BPH who presented to the ED from home after 2 episodes of loss of consciousness. He has had altered mental status since arrival, so history is limited.age-indeterminate infarct of the posterior left cerebellum was noted  No data recorded  Assessment / Plan / Recommendation CHL IP CLINICAL IMPRESSIONS 05/17/2020 Clinical Impression Pt  adequately awake for MBS however drowsy and required intermittent verbal/tactile stimulation. Oropharyngeal dysphagia was minimal without laryngeal penetration or aspiration observed. Orally, there was delayed propulsion with decreased cohesion, sublingual spill and lingual residue. Mild-mod vallecular residue with thin which reached pyriform sinuses with straw perhaps due to drowsiness. He did not have dentures donned and states they are in "all the time" therefore, puree (Dys 1) texutre recommended in addition to inconsistent alertness. Prognosis to return to regular is good. Pills whole in puree if alert.   SLP Visit Diagnosis Dysphagia, oropharyngeal phase (R13.12) Attention and concentration deficit following -- Frontal lobe and executive function deficit following -- Impact on safety and function --   CHL IP TREATMENT RECOMMENDATION 05/17/2020 Treatment Recommendations Therapy as outlined in treatment plan below   Prognosis 05/17/2020 Prognosis for Safe Diet Advancement Good Barriers to Reach Goals -- Barriers/Prognosis Comment -- CHL IP DIET RECOMMENDATION 05/17/2020 SLP Diet Recommendations Dysphagia 1 (Puree) solids;Thin liquid Liquid Administration via Cup;Straw Medication Administration Whole meds with puree Compensations Slow rate;Small sips/bites Postural Changes Seated upright at 90 degrees   CHL IP OTHER RECOMMENDATIONS 05/17/2020 Recommended Consults -- Oral Care Recommendations Oral care BID Other Recommendations --   CHL IP FOLLOW UP RECOMMENDATIONS 05/17/2020 Follow up Recommendations Skilled Nursing facility   [facility] IP FREQUENCY AND DURATION 05/17/2020 Speech Therapy Frequency (ACUTE ONLY) min 2x/week Treatment Duration 2 weeks      CHL IP ORAL PHASE 05/17/2020 Oral Phase Impaired Oral - Pudding Teaspoon -- Oral - Pudding Cup -- Oral - Honey Teaspoon -- Oral - Honey Cup -- Oral - Nectar Teaspoon -- Oral - Nectar Cup Decreased bolus cohesion;Reduced posterior propulsion Oral - Nectar Straw -- Oral -  Thin Teaspoon -- Oral - Thin Cup Decreased bolus cohesion Oral - Thin Straw -- Oral - Puree Decreased bolus cohesion;Reduced posterior propulsion Oral - Mech Soft -- Oral - Regular -- Oral - Multi-Consistency -- Oral - Pill -- Oral Phase - Comment --  CHL IP PHARYNGEAL PHASE 05/17/2020 Pharyngeal Phase Impaired Pharyngeal- Pudding Teaspoon -- Pharyngeal -- Pharyngeal- Pudding Cup -- Pharyngeal -- Pharyngeal- Honey Teaspoon -- Pharyngeal -- Pharyngeal- Honey Cup -- Pharyngeal -- Pharyngeal- Nectar Teaspoon -- Pharyngeal -- Pharyngeal- Nectar Cup Delayed swallow initiation-vallecula Pharyngeal -- Pharyngeal- Nectar Straw -- Pharyngeal -- Pharyngeal- Thin Teaspoon -- Pharyngeal -- Pharyngeal- Thin Cup WFL Pharyngeal -- Pharyngeal- Thin Straw Delayed swallow initiation-pyriform sinuses;Pharyngeal residue - valleculae;Delayed swallow initiation-vallecula Pharyngeal -- Pharyngeal- Puree WFL Pharyngeal -- Pharyngeal- Mechanical Soft -- Pharyngeal -- Pharyngeal- Regular -- Pharyngeal -- Pharyngeal- Multi-consistency -- Pharyngeal -- Pharyngeal- Pill -- Pharyngeal -- Pharyngeal Comment --  CHL IP CERVICAL ESOPHAGEAL PHASE 05/17/2020 Cervical Esophageal Phase WFL Pudding Teaspoon -- Pudding Cup -- Honey Teaspoon -- Honey Cup -- Nectar Teaspoon -- Nectar Cup -- Nectar Straw -- Thin Teaspoon -- Thin Cup -- Thin Straw -- Puree -- Mechanical Soft -- Regular -- Multi-consistency -- Pill -- Cervical Esophageal Comment -- [name] 05/17/2020, 3:28 PM [name] [name] administrator Pager [phone] Office [phone]                   Scheduled Meds: . aspirin  81 mg Per Tube Daily  . carbidopa-levodopa  1 tablet Per Tube TID WC  . carvedilol  6.25 mg Per Tube BID WC  . cholecalciferol  1,000 Units Per Tube Daily  . enoxaparin (LOVENOX) injection  40 mg Subcutaneous Daily  .  feeding supplement  237 mL Oral BID BM  . feeding supplement (OSMOLITE 1.5 CAL)  960 mL Per Tube Q24H  .  feeding supplement (PROSource TF)  90 mL Per Tube BID  . free water  155 mL Per Tube Q4H  . metroNIDAZOLE  500 mg Per Tube TID  . rivastigmine  1.5 mg Per Tube BID WC  . sodium chloride flush  3 mL Intravenous Q12H  . vitamin B-12  1,000 mcg Per Tube Daily   Continuous Infusions: . ceFEPime (MAXIPIME) IV 2 g (05/18/20 0951)  . dextrose 5 % and 0.9 % NaCl with KCl 20 mEq/L 50 mL/hr at 05/18/20 0416     LOS: 8 days     Alwyn RenElizabeth G Kadey Mihalic, MD  05/18/2020, 11:25 AM

## 2020-05-19 DIAGNOSIS — I10 Essential (primary) hypertension: Secondary | ICD-10-CM | POA: Diagnosis not present

## 2020-05-19 LAB — COMPREHENSIVE METABOLIC PANEL
ALT: 5 U/L (ref 0–44)
AST: 12 U/L — ABNORMAL LOW (ref 15–41)
Albumin: 1.9 g/dL — ABNORMAL LOW (ref 3.5–5.0)
Alkaline Phosphatase: 42 U/L (ref 38–126)
Anion gap: 6 (ref 5–15)
BUN: 36 mg/dL — ABNORMAL HIGH (ref 8–23)
CO2: 23 mmol/L (ref 22–32)
Calcium: 8.3 mg/dL — ABNORMAL LOW (ref 8.9–10.3)
Chloride: 109 mmol/L (ref 98–111)
Creatinine, Ser: 1.17 mg/dL (ref 0.61–1.24)
GFR, Estimated: >60 mL/min (ref >60)
Glucose, Bld: 114 mg/dL — ABNORMAL HIGH (ref 70–99)
Potassium: 4.7 mmol/L (ref 3.5–5.1)
Sodium: 138 mmol/L (ref 135–145)
Total Bilirubin: 0.5 mg/dL (ref 0.3–1.2)
Total Protein: 4.6 g/dL — ABNORMAL LOW (ref 6.5–8.1)

## 2020-05-19 LAB — CBC
HCT: 27.7 % — ABNORMAL LOW (ref 39.0–52.0)
Hemoglobin: 9.3 g/dL — ABNORMAL LOW (ref 13.0–17.0)
MCH: 30.3 pg (ref 26.0–34.0)
MCHC: 33.6 g/dL (ref 30.0–36.0)
MCV: 90.2 fL (ref 80.0–100.0)
Platelets: 267 10*3/uL (ref 150–400)
RBC: 3.07 MIL/uL — ABNORMAL LOW (ref 4.22–5.81)
RDW: 12.1 % (ref 11.5–15.5)
WBC: 6.1 10*3/uL (ref 4.0–10.5)
nRBC: 0 % (ref 0.0–0.2)

## 2020-05-19 LAB — GLUCOSE, CAPILLARY
Glucose-Capillary: 138 mg/dL — ABNORMAL HIGH (ref 70–99)
Glucose-Capillary: 98 mg/dL (ref 70–99)
Glucose-Capillary: 93 mg/dL (ref 70–99)
Glucose-Capillary: 91 mg/dL (ref 70–99)
Glucose-Capillary: 93 mg/dL (ref 70–99)

## 2020-05-19 MED ORDER — FUROSEMIDE 10 MG/ML IJ SOLN
20.0000 mg | Freq: Once | INTRAMUSCULAR | Status: AC
  Administered 2020-05-19: 20 mg via INTRAVENOUS
  Filled 2020-05-19: qty 2

## 2020-05-19 NOTE — Plan of Care (Signed)
  Problem: Nutrition: Goal: Adequate nutrition will be maintained Outcome: Progressing   Problem: Education: Goal: Knowledge of General Education information will improve Description: Including pain rating scale, medication(s)/side effects and non-pharmacologic comfort measures Outcome: Not Progressing   Problem: Activity: Goal: Risk for activity intolerance will decrease Outcome: Not Progressing   

## 2020-05-19 NOTE — Progress Notes (Signed)
Physical Therapy Treatment Patient Details Name: [name] MRN: [id number] DOB: Dec 03, 1940 Today's Date: 05/19/2020    History of Present Illness 79yo male with recent admit 11/10 to 11/16 due to AMS/syncope/incidental Covid, now returning to the ED from his SNF with AMS and non-responsiveness. Admitted with acute encephalopathy and parkinsons dementia in the context of Covid. 11/26 noted bil UE weeping with blistersPMH HLD, HTN, parasomnia, CKD, parkinsons dementia, falls    PT Comments    Patient more lethargic than last visit. Responds verbally but requires tactile with verbal cues to assist with ROM x 4 extremities. Mobility limited to bed level due to diarrhea. Requires +2 max assist for rolling and total assist to scoot to [facility].     Follow Up Recommendations  SNF;Supervision/Assistance - 24 hour     Equipment Recommendations  Wheelchair (measurements PT);Wheelchair cushion (measurements PT);Other (comment) (hoyer lift)    Recommendations for Other Services       Precautions / Restrictions Precautions Precautions: Fall    Mobility  Bed Mobility Overal bed mobility: Needs Assistance Bed Mobility: Rolling Rolling: Max assist;+2 for physical assistance         General bed mobility comments: As initiating mobility, pt with incontinence/diarrhea and rolled multiple times for cleaning and positioning   Transfers                 General transfer comment: too lethargic  Ambulation/Gait                 Stairs             Wheelchair Mobility    Modified Rankin (Stroke Patients Only) Modified Rankin (Stroke Patients Only) Pre-Morbid Rankin Score: Moderate disability Modified Rankin: Severe disability     Balance                                            Cognition Arousal/Alertness: Lethargic Behavior During Therapy: WFL for tasks assessed/performed Overall Cognitive Status: Impaired/Different from baseline Area of  Impairment: Attention;Following commands;Problem solving                 Orientation Level:  (oriente to self; otherwise NT) Current Attention Level: Focused   Following Commands: Follows one step commands with increased time;Follows one step commands inconsistently     Problem Solving: Slow processing;Decreased initiation;Requires verbal cues;Requires tactile cues General Comments: Aroused with sternal rub; answered questions throughout session however kept eyes closed      Exercises Other Exercises Other Exercises: AAROM x 4 extremities as removing linens, rolling, and later elevating UEs and floating heels    General Comments        Pertinent Vitals/Pain Pain Assessment: Faces Faces Pain Scale: No hurt    Home Living                      Prior Function            PT Goals (current goals can now be found in the care plan section) Acute Rehab PT Goals Patient Stated Goal: agrees wants to get stronger Time For Goal Achievement: 05/26/20 Potential to Achieve Goals: Fair Progress towards PT goals: Not progressing toward goals - comment (lethargy)    Frequency    Min 2X/week      PT Plan Current plan remains appropriate    Co-evaluation PT/OT/SLP Co-Evaluation/Treatment: Yes Reason for Co-Treatment: Complexity  of the patient's impairments (multi-system involvement);For patient/therapist safety;To address functional/ADL transfers PT goals addressed during session: Mobility/safety with mobility;Strengthening/ROM        AM-PAC PT "6 Clicks" Mobility   Outcome Measure  Help needed turning from your back to your side while in a flat bed without using bedrails?: A Lot Help needed moving from lying on your back to sitting on the side of a flat bed without using bedrails?: Total Help needed moving to and from a bed to a chair (including a wheelchair)?: Total Help needed standing up from a chair using your arms (e.g., wheelchair or bedside chair)?:  Total Help needed to walk in hospital room?: Total Help needed climbing 3-5 steps with a railing? : Total 6 Click Score: 7    End of Session   Activity Tolerance: Patient limited by lethargy Patient left: in bed;with call bell/phone within reach;with bed alarm set   PT Visit Diagnosis: Other abnormalities of gait and mobility (R26.89);Difficulty in walking, not elsewhere classified (R26.2);Muscle weakness (generalized) (M62.81);History of falling (Z91.81)     Time: 7673-4193 PT Time Calculation (min) (ACUTE ONLY): 23 min  Charges:  $Therapeutic Activity: 8-22 mins                      [name], PT Pager [phone]    Zena Amos 05/19/2020, 10:44 AM

## 2020-05-19 NOTE — Progress Notes (Signed)
SLP Cancellation Note  Patient Details Name: [name] MRN: [id number] DOB: 01/18/1941   Cancelled treatment:       Reason Eval/Treat Not Completed: Patient at procedure or test/unavailable (Pt receiving being cleaned by NT. SLP will follow up.)  Kawhi Diebold I. Vear Clock, MS, CCC-SLP Acute Rehabilitation Services Office number [phone] Pager [phone]  Scheryl Marten 05/19/2020, 5:16 PM

## 2020-05-19 NOTE — TOC Progression Note (Signed)
Transition of Care [facility]) - Progression Note    Patient Details  Name: [name] MRN: [id number] Date of Birth: [birth year]  [facility] CM/SW Contact  Mearl Latin, LCSW Phone Number: 05/19/2020, 12:04 PM  Clinical Narrative:    CSW continuing to follow for medical readiness to dc to SNF.      Barriers to Discharge: Continued Medical Work up  Expected Discharge Plan and Services   In-house Referral: Clinical Social Work                                             Social Determinants of Health (SDOH) Interventions    Readmission Risk Interventions No flowsheet data found.

## 2020-05-19 NOTE — Progress Notes (Signed)
Occupational Therapy Treatment Patient Details Name: [name] MRN: [id number] DOB: [birth year] Today's Date: 05/19/2020    History of present illness 79yo male with recent admit 11/10 to 11/16 due to AMS/syncope/incidental Covid, now returning to the ED from his SNF with AMS and non-responsiveness. Admitted with acute encephalopathy and parkinsons dementia in the context of Covid. 11/26 noted bil UE weeping with blistersPMH HLD, HTN, parasomnia, CKD, parkinsons dementia, falls   OT comments  Pt seen in conjunction with PT to maximize pts activity tolerance however pt much more lethargic this session. Pt found to be be incontinent of BM during session therefore session focused on bed mobility to completed pericare. Pt required MAX A +2 to roll R<>L multiple times. Pt was able to follow commands related to mobility tasks with increased time and tactile cues. Pt kept eyes closed most of session but did appropriately respond to therapists throughout session. Pt would continue to benefit from skilled occupational therapy while admitted and after d/c to address the below listed limitations in order to improve overall functional mobility and facilitate independence with BADL participation. DC plan remains appropriate, will follow acutely per POC.    Follow Up Recommendations  SNF    Equipment Recommendations  None recommended by OT    Recommendations for Other Services      Precautions / Restrictions Precautions Precautions: Fall Restrictions Weight Bearing Restrictions: No       Mobility Bed Mobility Overal bed mobility: Needs Assistance Bed Mobility: Rolling Rolling: Max assist;+2 for physical assistance         General bed mobility comments: As initiating mobility, pt with incontinence/diarrhea and rolled multiple times for cleaning and positioning   Transfers                 General transfer comment: NT d/t level of arousal    Balance                                            ADL either performed or assessed with clinical judgement   ADL Overall ADL's : Needs assistance/impaired                             Toileting- Clothing Manipulation and Hygiene: Total assistance;Sit to/from stand;+2 for physical assistance;+2 for safety/equipment Toileting - Clothing Manipulation Details (indicate cue type and reason): for pericare from bedlevel d/t incontinent BM     Functional mobility during ADLs: Maximal assistance;+2 for physical assistance (bed mobility only) General ADL Comments: pt more lethargic this session with session limited to bed level as pt found with incontinent BM needing MAX A +2 for bed mobility to roll R<>L and total A for posterior pericare     Vision       Perception     Praxis      Cognition Arousal/Alertness: Lethargic Behavior During Therapy: WFL for tasks assessed/performed Overall Cognitive Status: Impaired/Different from baseline Area of Impairment: Attention;Following commands;Problem solving                 Orientation Level:  (oriente to self; otherwise NT) Current Attention Level: Focused   Following Commands: Follows one step commands with increased time;Follows one step commands inconsistently     Problem Solving: Slow processing;Decreased initiation;Requires verbal cues;Requires tactile cues General Comments: pt able to answer questions from therapists throughout session  but kept eyes closed most of session. pt was able to follow commands related to mobility with increased time and tactile cues        Exercises Exercises: Other exercises Other Exercises Other Exercises: AAROM x 4 extremities as removing linens, rolling, and later elevating UEs and floating heels   Shoulder Instructions       General Comments pt on RA during session with VSS- BP 156/66 prior to session. Noted increased edema on BUEs; elevated BUEs on pillows at end of session to assist with edema mgmt.      Pertinent Vitals/ Pain       Pain Assessment: Faces Faces Pain Scale: No hurt  Home Living                                          Prior Functioning/Environment              Frequency  Min 2X/week        Progress Toward Goals  OT Goals(current goals can now be found in the care plan section)  Progress towards OT goals: Progressing toward goals  Acute Rehab OT Goals Patient Stated Goal: agrees wants to get stronger OT Goal Formulation: Patient unable to participate in goal setting Time For Goal Achievement: 05/27/20 Potential to Achieve Goals: Fair  Plan Discharge plan remains appropriate;Frequency remains appropriate    Co-evaluation      Reason for Co-Treatment: Complexity of the patient's impairments (multi-system involvement);Necessary to address cognition/behavior during functional activity;To address functional/ADL transfers PT goals addressed during session: Mobility/safety with mobility;Strengthening/ROM        AM-PAC OT "6 Clicks" Daily Activity     Outcome Measure   Help from another person eating meals?: A Lot Help from another person taking care of personal grooming?: A Lot Help from another person toileting, which includes using toliet, bedpan, or urinal?: Total Help from another person bathing (including washing, rinsing, drying)?: Total Help from another person to put on and taking off regular upper body clothing?: Total Help from another person to put on and taking off regular lower body clothing?: Total 6 Click Score: 8    End of Session    OT Visit Diagnosis: Unsteadiness on feet (R26.81)   Activity Tolerance Patient limited by lethargy   Patient Left in bed;with call bell/phone within reach;with bed alarm set   Nurse Communication          Time: 1610-9604 OT Time Calculation (min): 23 min  Charges: OT General Charges $OT Visit: 1 Visit OT Treatments $Self Care/Home Management : 8-22 mins  Audery Amel., COTA/L Acute Rehabilitation Services [phone]   Angelina Pih 05/19/2020, 11:33 AM

## 2020-05-19 NOTE — Progress Notes (Signed)
Sent note to Dr. [name]: Patient is still very lethargic and edema has increased over night. His right and left arms are weeping and edmea is increasing bilateral lower extremeties.

## 2020-05-19 NOTE — Progress Notes (Signed)
PROGRESS NOTE    [name]  ZGY:[id number] DOB: 26-Jan-1941 DOA: 05/10/2020 PCP: [name], MD  Brief Narrative-79 y.o.malewith medical history significant ofBPH, venous insufficiency, CKD, Parkinson's dementia, hypertension, falls, neutropenia, thrombocytopenia, recent admission(from 11/10-11/16 due to altered mental status, findings of old CVA, syncope, incidental Covid infection)who presents from his rehab facility with altered mental status, appears to be having fluctuating mental status, neurology work-up this admission is as significant including MRI, neurology were consulted, and they reported this is expected fluctuation if his progressive Parkinson disease and dementia, and recommendation is for him to continue his Parkinson meds, so core track tube was inserted, mentation has significantly improved, initially obtunded and unresponsive, currently communicative, but remains unsafe to swallow, on 11/20 2 AM patient was febrile, chest x-ray significant for pneumonia, bacterial, aspiration for which she is started on antibiotics.  Assessment & Plan:   Principal Problem:   Acute encephalopathy Active Problems:   Essential hypertension   Chronic venous insufficiency   Dementia due to Parkinson's disease without behavioral disturbance (HCC)   Parkinson's syndrome (HCC)   History of CVA (cerebrovascular accident)  #1 acute encephalopathy in the setting of Parkinson's disease and dementia.  Patient was recently admitted with similar complaints had extensive work-up including MRI of the brain echo EEG carotid ultrasound TEE.  A1c was normal LDL was 90.  He was started on statin and aspirin.  This admission CT of the head stable UA normal.  He was seen by neurology.  He has fluctuating mental status given it is natural progression of his illness.  Parkinson's medications are being continued through the core track tube.  Patient did have modified barium swallow today.  #2   pneumonia HCAP versus aspiration chest x-ray with bibasilar infiltrates.  On cefepime and Flagyl.  #3 history of stroke continue aspirin  #4 history of CKD stage III with AKI in the setting of decreased p.o. intake.  Improving with IV fluids.  His baseline creatinine is 1.5.  Creatinine is 1.24  #5 Parkinson's disease with dementia followed by Dr. Karel Jarvis  #6 hypokalemia/hypophosphatemia-repleted  #7 bilateral upper extremity edema Doppler shows no evidence of DVT.  #8 COVID-19 infection patient received monoclonal antibody 05/04/2020.  He also finished 10 days of isolation.  #9 FEN patient still has core track tube in place.  He was started on dysphagia 1 diet 05/18/2020.  Hoping he can get enough calories we may be able to take off the core track tube and send him to rehab. Start calorie count   Nutrition Problem: Inadequate oral intake Etiology: lethargy/confusion, inability to eat     Signs/Symptoms: NPO status    Interventions: Tube feeding, Prostat  Estimated body mass index is 29.67 kg/m as calculated from the following:   Height as of this encounter: 5\' 8"  (1.727 m).   Weight as of this encounter: 88.5 kg.  DVT prophylaxis: Lovenox  code Status: Full code Family Communication: Discussed with patient's wife  Disposition Plan:  Status is: Inpatient   Dispo: The patient is from: SNF              Anticipated d/c is to: SNF              Anticipated d/c date is: 2 to 3 days              Patient currently is not medically stable to d/c.   Consultants:  Neurology saw her on 05/12/2020 Procedures: None Antimicrobials:  Anti-infectives (From admission, onward)  Start     Dose/Rate Route Frequency Ordered Stop   05/15/20 0815  ceFEPIme (MAXIPIME) 2 g in sodium chloride 0.9 % 100 mL IVPB        2 g 200 mL/hr over 30 Minutes Intravenous Every 12 hours 05/15/20 0725     05/15/20 0800  metroNIDAZOLE (FLAGYL) 50 mg/ml oral suspension 500 mg        500 mg Per Tube 3  times daily 05/15/20 0708         Subjective: Patient resting in bed much more sleepier than yesterday however he will wake up for short while and then goes back to sleep he did the same thing 2 days ago and his daughter thought that he was in deep sleep objective: Vitals:   05/18/20 2137 05/19/20 0505 05/19/20 0811 05/19/20 1354  BP: 134/60 131/62 (!) 149/63 (!) 149/63  Pulse: 71 71 65 61  Resp: 18 20 17 17   Temp: 97.8 F (36.6 C) 98.1 F (36.7 C) 98.3 F (36.8 C)   TempSrc: Oral Oral Oral   SpO2: 97% 96% 98% 98%  Weight:  88.5 kg    Height:        Intake/Output Summary (Last 24 hours) at 05/19/2020 1423 Last data filed at 05/19/2020 1413 Gross per 24 hour  Intake 0 ml  Output 1450 ml  Net -1450 ml   Filed Weights   05/17/20 0436 05/18/20 0500 05/19/20 0505  Weight: 82.4 kg 89.6 kg 88.5 kg    Examination:  General exam: Appears chronically ill-appearing frail and deconditioned. Respiratory system: Diminished breath sounds at the bases with scattered rhonchi to auscultation. Respiratory effort normal. Cardiovascular system: S1 & S2 heard, RRR. No JVD, murmurs, rubs, gallops or clicks. No pedal edema. Gastrointestinal system: Abdomen is nondistended, soft and nontender. No organomegaly or masses felt. Normal bowel sounds heard. Central nervous system: More sleepier  extremities: 2+ right upper extremity edema 1+ left upper extremity edema and 1+ bilateral lower extremity edema Skin: No rashes, lesions or ulcers Psychiatry: Judgement and insight appear normal. Mood & affect appropriate.     Data Reviewed: I have personally reviewed following labs and imaging studies  CBC: Recent Labs  Lab 05/14/20 0847 05/15/20 0913 05/16/20 0052 05/17/20 0416 05/19/20 0939  WBC 5.9 12.6* 9.0 7.8 6.1  HGB 10.8* 9.5* 9.2* 9.6* 9.3*  HCT 30.7* 28.6* 27.5* 28.2* 27.7*  MCV 88.5 89.1 89.6 88.4 90.2  PLT 360 282 242 298 267   Basic Metabolic Panel: Recent Labs  Lab  05/12/20 1506 05/12/20 1506 05/13/20 0746 05/13/20 0746 05/13/20 1828 05/14/20 0847 05/15/20 0913 05/16/20 0052 05/17/20 0416 05/19/20 0939  NA  --   --  142   < >  --  141 140 140 138 138  K  --   --  3.6   < >  --  4.0 4.1 4.0 4.4 4.7  CL  --   --  114*   < >  --  114* 114* 109 108 109  CO2  --   --  21*   < >  --  20* 20* 23 23 23   GLUCOSE  --   --  165*   < >  --  139* 139* 142* 137* 114*  BUN  --   --  39*   < >  --  38* 35* 34* 29* 36*  CREATININE  --   --  1.62*   < >  --  1.54* 1.52* 1.31* 1.24 1.17  CALCIUM  --   --  8.6*   < >  --  8.5* 8.5* 8.2* 8.4* 8.3*  MG 1.9  --  1.9  --  2.0  --   --   --   --   --   PHOS 2.5   < > 2.0*  --  2.0* 2.4* 1.9* 2.1*  --   --    < > = values in this interval not displayed.   GFR: Estimated Creatinine Clearance: 54.4 mL/min (by C-G formula based on SCr of 1.17 mg/dL). Liver Function Tests: Recent Labs  Lab 05/14/20 0847 05/15/20 0913 05/16/20 0052 05/19/20 0939  AST 13* 10* 8* 12*  ALT 11 11 7 5   ALKPHOS 43 46 42 42  BILITOT 0.6 0.9 0.6 0.5  PROT 5.0* 5.0* 4.6* 4.6*  ALBUMIN 2.4* 2.3* 2.0* 1.9*   No results for input(s): LIPASE, AMYLASE in the last 168 hours. Recent Labs  Lab 05/15/20 0847  AMMONIA 28   Coagulation Profile: No results for input(s): INR, PROTIME in the last 168 hours. Cardiac Enzymes: No results for input(s): CKTOTAL, CKMB, CKMBINDEX, TROPONINI in the last 168 hours. BNP (last 3 results) No results for input(s): PROBNP in the last 8760 hours. HbA1C: No results for input(s): HGBA1C in the last 72 hours. CBG: Recent Labs  Lab 05/18/20 2001 05/18/20 2306 05/19/20 0332 05/19/20 0805 05/19/20 1202  GLUCAP 106* 103* 138* 98 93   Lipid Profile: No results for input(s): CHOL, HDL, LDLCALC, TRIG, CHOLHDL, LDLDIRECT in the last 72 hours. Thyroid Function Tests: No results for input(s): TSH, T4TOTAL, FREET4, T3FREE, THYROIDAB in the last 72 hours. Anemia Panel: No results for input(s): VITAMINB12,  FOLATE, FERRITIN, TIBC, IRON, RETICCTPCT in the last 72 hours. Sepsis Labs: Recent Labs  Lab 05/15/20 0913 05/16/20 0052 05/17/20 0416  PROCALCITON 0.43 0.50 0.28    Recent Results (from the past 240 hour(s))  Respiratory Panel by RT PCR (Flu A&B, Covid) - Nasopharyngeal Swab     Status: Abnormal   Collection Time: 05/10/20  9:50 PM   Specimen: Nasopharyngeal Swab  Result Value Ref Range Status   SARS Coronavirus 2 by RT PCR POSITIVE (A) NEGATIVE Final    Comment: RESULT CALLED TO, READ BACK BY AND VERIFIED WITH: Kerrin ChampagneJ WILKINS RN 05/11/20 AT 0002 SK (NOTE) SARS-CoV-2 target nucleic acids are DETECTED.  SARS-CoV-2 RNA is generally detectable in upper respiratory specimens  during the acute phase of infection. Positive results are indicative of the presence of the identified virus, but do not rule out bacterial infection or co-infection with other pathogens not detected by the test. Clinical correlation with patient history and other diagnostic information is necessary to determine patient infection status. The expected result is Negative.  Fact Sheet for Patients:  https://www.moore.com/https://www.fda.gov/media/142436/download  Fact Sheet for Healthcare Providers: https://www.young.biz/https://www.fda.gov/media/142435/download  This test is not yet approved or cleared by the [place] FDA and  has been authorized for detection and/or diagnosis of SARS-CoV-2 by FDA under an Emergency Use Authorization (EUA).  This EUA will remain in effect (meaning this test can be us ed) for the duration of  the COVID-19 declaration under Section 564(b)(1) of the Act, 21 U.S.C. section 360bbb-3(b)(1), unless the authorization is terminated or revoked sooner.      Influenza A by PCR NEGATIVE NEGATIVE Final   Influenza B by PCR NEGATIVE NEGATIVE Final    Comment: (NOTE) The Xpert Xpress SARS-CoV-2/FLU/RSV assay is intended as an aid in  the diagnosis of influenza from Nasopharyngeal swab  specimens and  should not be used as a  sole basis for treatment. Nasal washings and  aspirates are unacceptable for Xpert Xpress SARS-CoV-2/FLU/RSV  testing.  Fact Sheet for Patients: https://www.moore.com/  Fact Sheet for Healthcare Providers: https://www.young.biz/  This test is not yet approved or cleared by the [place] FDA and  has been authorized for detection and/or diagnosis of SARS-CoV-2 by  FDA under an Emergency Use Authorization (EUA). This EUA will remain  in effect (meaning this test can be used) for the duration of the  Covid-19 declaration under Section 564(b)(1) of the Act, 21  U.S.C. section 360bbb-3(b)(1), unless the authorization is  terminated or revoked. Performed at [facility], [address], [place], [place] [address]   Culture, blood (routine x 2)     Status: None (Preliminary result)   Collection Time: 05/15/20  9:13 AM   Specimen: BLOOD  Result Value Ref Range Status   Specimen Description BLOOD LEFT ANTECUBITAL  Final   Special Requests   Final    BOTTLES DRAWN AEROBIC AND ANAEROBIC Blood Culture results may not be optimal due to an inadequate volume of blood received in culture bottles   Culture   Final    NO GROWTH 3 DAYS Performed at [facility], [address], [place], [place] [address]    Report Status PENDING  Incomplete  Culture, blood (routine x 2)     Status: None (Preliminary result)   Collection Time: 05/15/20  9:13 AM   Specimen: BLOOD  Result Value Ref Range Status   Specimen Description BLOOD BLOOD LEFT HAND  Final   Special Requests   Final    BOTTLES DRAWN AEROBIC AND ANAEROBIC Blood Culture results may not be optimal due to an inadequate volume of blood received in culture bottles   Culture   Final    NO GROWTH 3 DAYS Performed at [facility], [address], [place], [place] [address]    Report Status PENDING  Incomplete         Radiology Studies: No results found.      Scheduled  Meds: . aspirin  81 mg Per Tube Daily  . carbidopa-levodopa  1 tablet Per Tube TID WC  . carvedilol  6.25 mg Per Tube BID WC  . cholecalciferol  1,000 Units Per Tube Daily  . enoxaparin (LOVENOX) injection  40 mg Subcutaneous Daily  . feeding supplement  237 mL Oral BID BM  . feeding supplement (OSMOLITE 1.5 CAL)  960 mL Per Tube Q24H  . feeding supplement (PROSource TF)  90 mL Per Tube BID  . free water  155 mL Per Tube Q4H  . metroNIDAZOLE  500 mg Per Tube TID  . rivastigmine  1.5 mg Per Tube BID WC  . sodium chloride flush  3 mL Intravenous Q12H  . vitamin B-12  1,000 mcg Per Tube Daily   Continuous Infusions: . ceFEPime (MAXIPIME) IV 2 g (05/19/20 0801)     LOS: 9 days     Alwyn Ren, MD  05/19/2020, 2:23 PM

## 2020-05-19 NOTE — TOC Progression Note (Signed)
Transition of Care [facility]) - Progression Note    Patient Details  Name: [name] MRN: [id number] Date of Birth: 1940/10/09  [facility] CM/SW Contact  Mearl Latin, LCSW Phone Number: 05/19/2020, 12:03 PM  Clinical Narrative:    CSW continuing to follow for DC needs.     Barriers to Discharge: Continued Medical Work up  Expected Discharge Plan and Services   In-house Referral: Clinical Social Work                                             Social Determinants of Health (SDOH) Interventions    Readmission Risk Interventions No flowsheet data found.

## 2020-05-20 DIAGNOSIS — I10 Essential (primary) hypertension: Secondary | ICD-10-CM | POA: Diagnosis not present

## 2020-05-20 LAB — CULTURE, BLOOD (ROUTINE X 2)
Culture: NO GROWTH
Culture: NO GROWTH

## 2020-05-20 LAB — GLUCOSE, CAPILLARY
Glucose-Capillary: 129 mg/dL — ABNORMAL HIGH (ref 70–99)
Glucose-Capillary: 101 mg/dL — ABNORMAL HIGH (ref 70–99)
Glucose-Capillary: 120 mg/dL — ABNORMAL HIGH (ref 70–99)

## 2020-05-20 NOTE — Progress Notes (Signed)
PROGRESS NOTE    [name]  HMC:[id number] DOB: 01/14/1941 DOA: 05/10/2020 PCP: [name], MD  Brief Narrative-79 y.o.malewith medical history significant ofBPH, venous insufficiency, CKD, Parkinson's dementia, hypertension, falls, neutropenia, thrombocytopenia, recent admission(from 11/10-11/16 due to altered mental status, findings of old CVA, syncope, incidental Covid infection)who presents from his rehab facility with altered mental status, appears to be having fluctuating mental status, neurology work-up this admission is as significant including MRI, neurology were consulted, and they reported this is expected fluctuation if his progressive Parkinson disease and dementia, and recommendation is for him to continue his Parkinson meds, so core track tube was inserted, mentation has significantly improved, initially obtunded and unresponsive, currently communicative, but remains unsafe to swallow, on 11/20 2 AM patient was febrile, chest x-ray significant for pneumonia, bacterial, aspiration for which she is started on antibiotics.  Assessment & Plan:   Principal Problem:   Acute encephalopathy Active Problems:   Essential hypertension   Chronic venous insufficiency   Dementia due to Parkinson's disease without behavioral disturbance (HCC)   Parkinson's syndrome (HCC)   History of CVA (cerebrovascular accident)  #1 acute encephalopathy in the setting of Parkinson's disease and dementia.  Patient was recently admitted with similar complaints had extensive work-up including MRI of the brain echo EEG carotid ultrasound TEE.  A1c was normal LDL was 90.  He was started on statin and aspirin.  This admission CT of the head stable UA normal.  He was seen by neurology.  He has fluctuating mental status given it is natural progression of his illness.  Parkinson's medications are being continued through the core track tube.  Patient did have modified barium swallow and speech  recommended dysphagia 1 diet.  However patient has not been eating more than a few spoons full every meal, this to with great difficulty. Discussed with wife about placing a PEG tube which was discussed with the family by the prior attending. I will consult IR for PEG placement. I tried to call her daughter at [phone] I was unable to reach her. I did speak with her about the PEG tube 2 days ago.  She is a Engineer, civil (consulting).  #2  pneumonia HCAP versus aspiration chest x-ray with bibasilar infiltrates.  On cefepime and Flagyl since 05/15/2020.  Consider stopping them tomorrow.  #3 history of stroke continue aspirin  #4 history of CKD stage III with AKI in the setting of decreased p.o. intake.  Improved  with IV fluids.  His baseline creatinine is 1.5.  Creatinine is 1.14  #5 Parkinson's disease with dementia followed by Dr. Karel Jarvis  #6 hypokalemia/hypophosphatemia-repleted and resolved.  #7 bilateral upper extremity edema Doppler shows no evidence of DVT.  #8 COVID-19 infection patient received monoclonal antibody 05/04/2020.  He also finished 10 days of isolation.  #9 FEN patient still has core track tube in place.  He was started on dysphagia 1 diet 05/18/2020.  Hoping he can get enough calories we may be able to take off the core track tube and send him to rehab. Start calorie count Consult IR for PEG tube.   Nutrition Problem: Inadequate oral intake Etiology: lethargy/confusion, inability to eat     Signs/Symptoms: NPO status    Interventions: Tube feeding, Prostat  Estimated body mass index is 29.67 kg/m as calculated from the following:   Height as of this encounter: 5\' 8"  (1.727 m).   Weight as of this encounter: 88.5 kg.  DVT prophylaxis: Lovenox  code Status: Full code Family Communication: Discussed  with patient's wife  Disposition Plan:  Status is: Inpatient   Dispo: The patient is from: SNF              Anticipated d/c is to: SNF              Anticipated d/c date is:  2 to 3 days              Patient currently is not medically stable to d/c.   Consultants:  Neurology saw her on 05/12/2020 Procedures: None Antimicrobials:  Anti-infectives (From admission, onward)   Start     Dose/Rate Route Frequency Ordered Stop   05/15/20 0815  ceFEPIme (MAXIPIME) 2 g in sodium chloride 0.9 % 100 mL IVPB        2 g 200 mL/hr over 30 Minutes Intravenous Every 12 hours 05/15/20 0725     05/15/20 0800  metroNIDAZOLE (FLAGYL) 50 mg/ml oral suspension 500 mg        500 mg Per Tube 3 times daily 05/15/20 0708         Subjective: He is resting in bed with his eyes closed.  When I called his name he will not open his eyes for me.  After telling him multiple times to open his eyes he opened his eyes and looked at me. He followed commands and was appropriate. He was able to name his wife's and his daughter's names.  objective: Vitals:   05/19/20 0811 05/19/20 1354 05/19/20 2000 05/20/20 0400  BP: (!) 149/63 (!) 149/63 (!) 156/66 (!) 118/52  Pulse: 65 61 65 64  Resp: 17 17 16 17   Temp: 98.3 F (36.8 C)  98.3 F (36.8 C) 98 F (36.7 C)  TempSrc: Oral  Axillary Oral  SpO2: 98% 98% 97% 96%  Weight:      Height:        Intake/Output Summary (Last 24 hours) at 05/20/2020 1318 Last data filed at 05/20/2020 1100 Gross per 24 hour  Intake 0 ml  Output 3550 ml  Net -3550 ml   Filed Weights   05/17/20 0436 05/18/20 0500 05/19/20 0505  Weight: 82.4 kg 89.6 kg 88.5 kg    Examination: Core track tube in place.  General exam: Appears chronically ill-appearing frail and deconditioned. Respiratory system: Diminished breath sounds at the bases with scattered rhonchi to auscultation. Respiratory effort normal. Cardiovascular system: S1 & S2 heard, RRR. No JVD, murmurs, rubs, gallops or clicks. No pedal edema. Gastrointestinal system: Abdomen is nondistended, soft and nontender. No organomegaly or masses felt. Normal bowel sounds heard. Central nervous system: More  sleepier  extremities: 2+ right upper extremity edema 1+ left upper extremity edema and 1+ bilateral lower extremity edema Skin: No rashes, lesions or ulcers Psychiatry: Judgement and insight appear normal. Mood & affect appropriate.     Data Reviewed: I have personally reviewed following labs and imaging studies  CBC: Recent Labs  Lab 05/14/20 0847 05/15/20 0913 05/16/20 0052 05/17/20 0416 05/19/20 0939  WBC 5.9 12.6* 9.0 7.8 6.1  HGB 10.8* 9.5* 9.2* 9.6* 9.3*  HCT 30.7* 28.6* 27.5* 28.2* 27.7*  MCV 88.5 89.1 89.6 88.4 90.2  PLT 360 282 242 298 267   Basic Metabolic Panel: Recent Labs  Lab 05/13/20 1828 05/14/20 0847 05/15/20 0913 05/16/20 0052 05/17/20 0416 05/19/20 0939  NA  --  141 140 140 138 138  K  --  4.0 4.1 4.0 4.4 4.7  CL  --  114* 114* 109 108 109  CO2  --  20* 20* 23 23 23   GLUCOSE  --  139* 139* 142* 137* 114*  BUN  --  38* 35* 34* 29* 36*  CREATININE  --  1.54* 1.52* 1.31* 1.24 1.17  CALCIUM  --  8.5* 8.5* 8.2* 8.4* 8.3*  MG 2.0  --   --   --   --   --   PHOS 2.0* 2.4* 1.9* 2.1*  --   --    GFR: Estimated Creatinine Clearance: 54.4 mL/min (by C-G formula based on SCr of 1.17 mg/dL). Liver Function Tests: Recent Labs  Lab 05/14/20 0847 05/15/20 0913 05/16/20 0052 05/19/20 0939  AST 13* 10* 8* 12*  ALT 11 11 7 5   ALKPHOS 43 46 42 42  BILITOT 0.6 0.9 0.6 0.5  PROT 5.0* 5.0* 4.6* 4.6*  ALBUMIN 2.4* 2.3* 2.0* 1.9*   No results for input(s): LIPASE, AMYLASE in the last 168 hours. Recent Labs  Lab 05/15/20 0847  AMMONIA 28   Coagulation Profile: No results for input(s): INR, PROTIME in the last 168 hours. Cardiac Enzymes: No results for input(s): CKTOTAL, CKMB, CKMBINDEX, TROPONINI in the last 168 hours. BNP (last 3 results) No results for input(s): PROBNP in the last 8760 hours. HbA1C: No results for input(s): HGBA1C in the last 72 hours. CBG: Recent Labs  Lab 05/19/20 0805 05/19/20 1202 05/19/20 1609 05/19/20 2122 05/20/20 0740   GLUCAP 98 93 91 93 129*   Lipid Profile: No results for input(s): CHOL, HDL, LDLCALC, TRIG, CHOLHDL, LDLDIRECT in the last 72 hours. Thyroid Function Tests: No results for input(s): TSH, T4TOTAL, FREET4, T3FREE, THYROIDAB in the last 72 hours. Anemia Panel: No results for input(s): VITAMINB12, FOLATE, FERRITIN, TIBC, IRON, RETICCTPCT in the last 72 hours. Sepsis Labs: Recent Labs  Lab 05/15/20 0913 05/16/20 0052 05/17/20 0416  PROCALCITON 0.43 0.50 0.28    Recent Results (from the past 240 hour(s))  Respiratory Panel by RT PCR (Flu A&B, Covid) - Nasopharyngeal Swab     Status: Abnormal   Collection Time: 05/10/20  9:50 PM   Specimen: Nasopharyngeal Swab  Result Value Ref Range Status   SARS Coronavirus 2 by RT PCR POSITIVE (A) NEGATIVE Final    Comment: RESULT CALLED TO, READ BACK BY AND VERIFIED WITH: 05/19/20 RN 05/11/20 AT 0002 SK (NOTE) SARS-CoV-2 target nucleic acids are DETECTED.  SARS-CoV-2 RNA is generally detectable in upper respiratory specimens  during the acute phase of infection. Positive results are indicative of the presence of the identified virus, but do not rule out bacterial infection or co-infection with other pathogens not detected by the test. Clinical correlation with patient history and other diagnostic information is necessary to determine patient infection status. The expected result is Negative.  Fact Sheet for Patients:  Kerrin Champagne  Fact Sheet for Healthcare Providers: 05/13/20  This test is not yet approved or cleared by the https://www.moore.com/ FDA and  has been authorized for detection and/or diagnosis of SARS-CoV-2 by FDA under an Emergency Use Authorization (EUA).  This EUA will remain in effect (meaning this test can be https://www.young.biz/ ed) for the duration of  the COVID-19 declaration under Section 564(b)(1) of the Act, 21 U.S.C. section 360bbb-3(b)(1), unless the authorization  is terminated or revoked sooner.      Influenza A by PCR NEGATIVE NEGATIVE Final   Influenza B by PCR NEGATIVE NEGATIVE Final    Comment: (NOTE) The Xpert Xpress SARS-CoV-2/FLU/RSV assay is intended as an aid in  the diagnosis of influenza from Nasopharyngeal swab specimens and  should not be used as a sole basis for treatment. Nasal washings and  aspirates are unacceptable for Xpert Xpress SARS-CoV-2/FLU/RSV  testing.  Fact Sheet for Patients: https://www.moore.com/https://www.fda.gov/media/142436/download  Fact Sheet for Healthcare Providers: https://www.young.biz/https://www.fda.gov/media/142435/download  This test is not yet approved or cleared by the [place] FDA and  has been authorized for detection and/or diagnosis of SARS-CoV-2 by  FDA under an Emergency Use Authorization (EUA). This EUA will remain  in effect (meaning this test can be used) for the duration of the  Covid-19 declaration under Section 564(b)(1) of the Act, 21  U.S.C. section 360bbb-3(b)(1), unless the authorization is  terminated or revoked. Performed at [facility], [address] [place], [place] [address]   Culture, blood (routine x 2)     Status: None   Collection Time: 05/15/20  9:13 AM   Specimen: BLOOD  Result Value Ref Range Status   Specimen Description BLOOD LEFT ANTECUBITAL  Final   Special Requests   Final    BOTTLES DRAWN AEROBIC AND ANAEROBIC Blood Culture results may not be optimal due to an inadequate volume of blood received in culture bottles   Culture   Final    NO GROWTH 5 DAYS Performed at [facility], [address] [place], [place] [address]    Report Status 05/20/2020 FINAL  Final  Culture, blood (routine x 2)     Status: None   Collection Time: 05/15/20  9:13 AM   Specimen: BLOOD  Result Value Ref Range Status   Specimen Description BLOOD BLOOD LEFT HAND  Final   Special Requests   Final    BOTTLES DRAWN AEROBIC AND ANAEROBIC Blood Culture results may not be optimal due to an inadequate  volume of blood received in culture bottles   Culture   Final    NO GROWTH 5 DAYS Performed at [facility], [address] [place], [place] [address]    Report Status 05/20/2020 FINAL  Final         Radiology Studies: No results found.      Scheduled Meds: . aspirin  81 mg Per Tube Daily  . carbidopa-levodopa  1 tablet Per Tube TID WC  . carvedilol  6.25 mg Per Tube BID WC  . cholecalciferol  1,000 Units Per Tube Daily  . enoxaparin (LOVENOX) injection  40 mg Subcutaneous Daily  . feeding supplement  237 mL Oral BID BM  . feeding supplement (OSMOLITE 1.5 CAL)  960 mL Per Tube Q24H  . feeding supplement (PROSource TF)  90 mL Per Tube BID  . free water  155 mL Per Tube Q4H  . metroNIDAZOLE  500 mg Per Tube TID  . rivastigmine  1.5 mg Per Tube BID WC  . sodium chloride flush  3 mL Intravenous Q12H  . vitamin B-12  1,000 mcg Per Tube Daily   Continuous Infusions: . ceFEPime (MAXIPIME) IV 2 g (05/20/20 1034)     LOS: 10 days     Alwyn RenElizabeth G Dori Devino, MD  05/20/2020, 1:18 PM

## 2020-05-21 ENCOUNTER — Inpatient Hospital Stay (HOSPITAL_COMMUNITY): Payer: Medicare Other

## 2020-05-21 DIAGNOSIS — G9340 Encephalopathy, unspecified: Secondary | ICD-10-CM | POA: Diagnosis not present

## 2020-05-21 DIAGNOSIS — I10 Essential (primary) hypertension: Secondary | ICD-10-CM | POA: Diagnosis not present

## 2020-05-21 LAB — GLUCOSE, CAPILLARY
Glucose-Capillary: 114 mg/dL — ABNORMAL HIGH (ref 70–99)
Glucose-Capillary: 120 mg/dL — ABNORMAL HIGH (ref 70–99)
Glucose-Capillary: 129 mg/dL — ABNORMAL HIGH (ref 70–99)
Glucose-Capillary: 131 mg/dL — ABNORMAL HIGH (ref 70–99)
Glucose-Capillary: 83 mg/dL (ref 70–99)
Glucose-Capillary: 95 mg/dL (ref 70–99)

## 2020-05-21 LAB — TSH: TSH: 4.788 u[IU]/mL — ABNORMAL HIGH (ref 0.350–4.500)

## 2020-05-21 MED ORDER — LACTATED RINGERS IV SOLN: INTRAVENOUS | Status: DC

## 2020-05-21 NOTE — Progress Notes (Signed)
Subjective: Due to continued encephalopathy, neurology has been asked to revisit this case.  He was originally admitted to the hospital on 11/10 with syncope and confusion.  This was felt to be due to orthostatic syncope in the setting of Parkinson's disease.  He was also found to be positive on nasal swab for COVID-19.  He received bamlanivimab/etesevimab 11/11.  According to chart he initially had altered mentation, but this improved by 11/16, but he was readmitted on 11/17 with altered mental status.  Neurology was consulted on 11/19 and there was suspicion that he might have had some worsening of his cognition in the setting of missing his medications due to AMS.  Since that time, he has had minimal improvement.  He was started on cefepime on 11/22 due to abnormal x-ray on 11/21.  He was started on 2 g every 12 hours.  On 11/23 it was noted that he was denying any complaints, oriented x2.   Due to continued difficulty with swallowing and encephalopathy, Neurology has been asked to weigh back in.   Exam: Vitals:   05/21/20 1703 05/21/20 1948  BP: (!) 179/75 (!) 174/62  Pulse: 70 66  Resp: 18 17  Temp: 98.2 F (36.8 C) 98.4 F (36.9 C)  SpO2: 98% 100%   Gen: In bed, NAD Resp: non-labored breathing, no acute distress Abd: soft, nt  Neuro: MS: Awake, oriented to month, year, but unable to give # of quarters in $2.75 GE:XBMW, VFF, face is symmetric, tongue midline, but severe dysarthria.  Motor: Mildly increased tone L > R,  Sensory:intact to LT  Pertinent Labs: TSH 11/28 4.78 (doubt significance) Calcium 8.3 Albumin 1.9, AST 12, ALT five, normal bilirubin Anemic with hemoglobin of  9.3 Ammonia from 11/22: 28  Impression: 79 yo M with persistent encephalopathy in the setting of parkinsons disease and dementia. I suspect that he is not compensating for his dementia due to environmental change, recent pneumonia, possibly with contribution from antibiotics.    Recommendations: 1)  Would recommend change from cefepime as this can cause encphalopathy as well.  2) Agree with EEG, if the AMS continues to be episodic, could consider LTM EEG monitoring 3) AM cortisol.  4) continue home sinemet.   Ritta Slot, MD [facility] [phone]  If 7pm- 7am, please page neurology on call as listed in AMION.

## 2020-05-21 NOTE — Progress Notes (Signed)
PROGRESS NOTE    [name]  JJO:[id number] DOB: 03-11-1941 DOA: 05/10/2020 PCP: [name], MD  Brief Narrative - 79 y.o.malewith medical history significant ofBPH, venous insufficiency, CKD, Parkinson's dementia, hypertension, falls, neutropenia, thrombocytopenia, recent admission(from 11/10-11/16 due to altered mental status, findings of old CVA, syncope, incidental Covid infection)who presents from his rehab facility with altered mental status, appears to be having fluctuating mental status, neurology work-up this admission is as significant including MRI, neurology were consulted, and they reported this is expected fluctuation if his progressive Parkinson disease and dementia, and recommendation is for him to continue his Parkinson meds, so core track tube was inserted, mentation has significantly improved, initially obtunded and unresponsive, currently communicative, but remains unsafe to swallow, on 11/20 2 AM patient was febrile, chest x-ray significant for pneumonia, bacterial, aspiration for which she is started on antibiotics.  Assessment & Plan:   #1.  Acute encephalopathy in the setting of Parkinson's disease and dementia.  Patient was recently admitted with similar complaints had extensive work-up including MRI of the brain showing an old infarct, echo, EEG, carotid ultrasound were all nonacute.  A1c was normal LDL was 90.  He was started on statin and aspirin.  This admission CT of the head stable UA normal.  He was seen by neurology.  He has a fluctuating mental status given it is natural progression of his illness.  Parkinson's medications are being continued through the core track tube.    Patient showed improvement in mental status last admission was sent to SNF but soon thereafter became confused again was sent back to the hospital.  This admission he was treated for aspiration pneumonia, he patient is being supported by core track feeding.  His mentation, continues  to be poor, he is unable to open his eyes but will mumble a few words.  So far this was being thought to be progression of his Parkinson's dementia, however wife is very clear that on 05/02/2020 a day before previous hospitalization he was leading a normal life and was quite active.  I will repeat the EEG and the MRI, will also request neurology to opine.  For now I am going to discontinue his Exelon patch.    #2  pneumonia HCAP versus aspiration chest x-ray with bibasilar infiltrates.  On cefepime and Flagyl since 05/15/2020.  Stop 11/28.  #3 history of stroke continue aspirin and statin via NG tube.  #4 history of CKD stage III with AKI in the setting of decreased p.o. intake.  Improved  with IV fluids.  His baseline creatinine is 1.5.  Creatinine is 1.14  #5 Parkinson's disease with dementia followed by Dr. Karel Jarvis  #6 hypokalemia/hypophosphatemia-repleted and resolved.  #7 bilateral upper extremity edema Doppler shows no evidence of DVT.  #8 COVID-19 infection patient received monoclonal antibody 05/04/2020.  He also finished 10 days of isolation.  #9 FEN patient still has core track tube in place.  He was started on dysphagia 1 diet 05/18/2020.  Once neurological evaluation is done we will readdress if he requires PEG tube.  Nutrition Problem: Inadequate oral intake Etiology: lethargy/confusion, inability to eat  Signs/Symptoms: NPO status  Interventions: Tube feeding, Prostat  Estimated body mass index is 28.96 kg/m as calculated from the following:   Height as of this encounter: 5\' 8"  (1.727 m).   Weight as of this encounter: 86.4 kg.  DVT prophylaxis: Lovenox  Code Status: Full code Family Communication: Discussed with patient's wife bedside & daughter at - 05/21/20  Disposition Plan:  Status is: Inpatient   Dispo: The patient is from: SNF              Anticipated d/c is to: SNF              Anticipated d/c date is: 2 to 3 days              Patient currently is  not medically stable to d/c.   Consultants:  Neurology saw her on 05/12/2020 Procedures: None Antimicrobials:  Anti-infectives (From admission, onward)   Start     Dose/Rate Route Frequency Ordered Stop   05/15/20 0815  ceFEPIme (MAXIPIME) 2 g in sodium chloride 0.9 % 100 mL IVPB        2 g 200 mL/hr over 30 Minutes Intravenous Every 12 hours 05/15/20 0725     05/15/20 0800  metroNIDAZOLE (FLAGYL) 50 mg/ml oral suspension 500 mg        500 mg Per Tube 3 times daily 05/15/20 0708         Subjective:  Patient in bed, appears comfortable, his eyes closed but denies any headache chest or abdominal pain.   Objective:  Vitals:   05/20/20 0400 05/20/20 2016 05/21/20 0558 05/21/20 0700  BP: (!) 118/52 137/63 (!) 113/56   Pulse: 64 64 68   Resp: 17 18 20    Temp: 98 F (36.7 C) 98 F (36.7 C) 98.7 F (37.1 C)   TempSrc: Oral Oral Oral   SpO2: 96% 98% 95%   Weight:    86.4 kg  Height:        Intake/Output Summary (Last 24 hours) at 05/21/2020 1031 Last data filed at 05/21/2020 0527 Gross per 24 hour  Intake --  Output 1825 ml  Net -1825 ml   Filed Weights   05/18/20 0500 05/19/20 0505 05/21/20 0700  Weight: 89.6 kg 88.5 kg 86.4 kg    Examination:     Awake somnolent, does not open eyes but denies any headache or chest pain, will move it up upper extremities a little bit but not reliably following commands [place].AT,PERRAL, NG tube in place Supple Neck,No JVD, No cervical lymphadenopathy appriciated.  Symmetrical Chest wall movement, Good air movement bilaterally, CTAB RRR,No Gallops, Rubs or new Murmurs, No Parasternal Heave +ve B.Sounds, Abd Soft, No tenderness, No organomegaly appriciated, No rebound - guarding or rigidity. No Cyanosis, 1+ edema.    Data Reviewed: I have personally reviewed following labs and imaging studies  CBC: Recent Labs  Lab 05/15/20 0913 05/16/20 0052 05/17/20 0416 05/19/20 0939  WBC 12.6* 9.0 7.8 6.1  HGB 9.5* 9.2* 9.6* 9.3*  HCT  28.6* 27.5* 28.2* 27.7*  MCV 89.1 89.6 88.4 90.2  PLT 282 242 298 267   Basic Metabolic Panel: Recent Labs  Lab 05/15/20 0913 05/16/20 0052 05/17/20 0416 05/19/20 0939  NA 140 140 138 138  K 4.1 4.0 4.4 4.7  CL 114* 109 108 109  CO2 20* 23 23 23   GLUCOSE 139* 142* 137* 114*  BUN 35* 34* 29* 36*  CREATININE 1.52* 1.31* 1.24 1.17  CALCIUM 8.5* 8.2* 8.4* 8.3*  PHOS 1.9* 2.1*  --   --    GFR: Estimated Creatinine Clearance: 53.8 mL/min (by C-G formula based on SCr of 1.17 mg/dL). Liver Function Tests: Recent Labs  Lab 05/15/20 0913 05/16/20 0052 05/19/20 0939  AST 10* 8* 12*  ALT 11 7 5   ALKPHOS 46 42 42  BILITOT 0.9 0.6 0.5  PROT 5.0* 4.6* 4.6*  ALBUMIN 2.3* 2.0* 1.9*   No results for input(s): LIPASE, AMYLASE in the last 168 hours. Recent Labs  Lab 05/15/20 0847  AMMONIA 28   Coagulation Profile: No results for input(s): INR, PROTIME in the last 168 hours. Cardiac Enzymes: No results for input(s): CKTOTAL, CKMB, CKMBINDEX, TROPONINI in the last 168 hours. BNP (last 3 results) No results for input(s): PROBNP in the last 8760 hours. HbA1C: No results for input(s): HGBA1C in the last 72 hours. CBG: Recent Labs  Lab 05/20/20 1624 05/20/20 2117 05/21/20 0030 05/21/20 0305 05/21/20 0739  GLUCAP 101* 120* 120* 129* 131*   Lipid Profile: No results for input(s): CHOL, HDL, LDLCALC, TRIG, CHOLHDL, LDLDIRECT in the last 72 hours. Thyroid Function Tests: No results for input(s): TSH, T4TOTAL, FREET4, T3FREE, THYROIDAB in the last 72 hours. Anemia Panel: No results for input(s): VITAMINB12, FOLATE, FERRITIN, TIBC, IRON, RETICCTPCT in the last 72 hours. Sepsis Labs: Recent Labs  Lab 05/15/20 0913 05/16/20 0052 05/17/20 0416  PROCALCITON 0.43 0.50 0.28    Recent Results (from the past 240 hour(s))  Culture, blood (routine x 2)     Status: None   Collection Time: 05/15/20  9:13 AM   Specimen: BLOOD  Result Value Ref Range Status   Specimen Description  BLOOD LEFT ANTECUBITAL  Final   Special Requests   Final    BOTTLES DRAWN AEROBIC AND ANAEROBIC Blood Culture results may not be optimal due to an inadequate volume of blood received in culture bottles   Culture   Final    NO GROWTH 5 DAYS Performed at [facility], [address] [place], [place] [address]    Report Status 05/20/2020 FINAL  Final  Culture, blood (routine x 2)     Status: None   Collection Time: 05/15/20  9:13 AM   Specimen: BLOOD  Result Value Ref Range Status   Specimen Description BLOOD BLOOD LEFT HAND  Final   Special Requests   Final    BOTTLES DRAWN AEROBIC AND ANAEROBIC Blood Culture results may not be optimal due to an inadequate volume of blood received in culture bottles   Culture   Final    NO GROWTH 5 DAYS Performed at [facility] [place] [facility], [address], [place], [place] [address]    Report Status 05/20/2020 FINAL  Final         Radiology Studies: DG Chest Port 1 View  Result Date: 05/21/2020 CLINICAL DATA:  Shortness of breath EXAM: PORTABLE CHEST 1 VIEW COMPARISON:  May 14, 2020. FINDINGS: Feeding tube tip is below the diaphragm, likely in the distal stomach or proximal duodenum region. No pneumothorax. Small pleural effusions noted bilaterally. No edema or airspace opacity appreciable. Heart is upper normal in size with pulmonary vascularity normal. No adenopathy. There is aortic atherosclerosis. No bone lesions. IMPRESSION: Feeding tube as described. Small layering pleural effusions bilaterally. No edema or airspace opacity. Heart upper normal in size. Aortic Atherosclerosis (ICD10-I70.0). Electronically Signed   By: Bretta Bang III M.D.   On: 05/21/2020 08:41    Scheduled Meds: . aspirin  81 mg Per Tube Daily  . carbidopa-levodopa  1 tablet Per Tube TID WC  . carvedilol  6.25 mg Per Tube BID WC  . cholecalciferol  1,000 Units Per Tube Daily  . enoxaparin (LOVENOX) injection  40 mg Subcutaneous Daily  . feeding supplement  237  mL Oral BID BM  . feeding supplement (OSMOLITE 1.5 CAL)  960 mL Per Tube Q24H  . feeding  supplement (PROSource TF)  90 mL Per Tube BID  . free water  155 mL Per Tube Q4H  . metroNIDAZOLE  500 mg Per Tube TID  . rivastigmine  1.5 mg Per Tube BID WC  . sodium chloride flush  3 mL Intravenous Q12H  . vitamin B-12  1,000 mcg Per Tube Daily   Continuous Infusions: . ceFEPime (MAXIPIME) IV 2 g (05/21/20 0939)     LOS: 11 days     Susa RaringPrashant Lonell Stamos, MD  05/21/2020, 10:31 AM

## 2020-05-21 NOTE — Progress Notes (Signed)
  Pharmacy Antibiotic Note  [name] is a 79 y.o. male admitted on 05/10/2020 with pneumonia.  Pharmacy has been consulted for cefepime dosing. Pt is also on flagyl for HAP coverage. Today is day 7 of both therapies.  Pt WBC WNL and remains afebrile. CXR from today shows no airspace opacities (improved).   Plan: Consider discontinuation of cefepime/flagyl today (day 7 of treatment) Doses are appropriate for renal function Monitor renal function, clinical progress  Height: 5\' 8"  (172.7 cm) Weight: 86.4 kg (190 lb 7.6 oz) IBW/kg (Calculated) : 68.4  Temp (24hrs), Avg:98.4 F (36.9 C), Min:98 F (36.7 C), Max:98.7 F (37.1 C)  Recent Labs  Lab 05/15/20 0913 05/16/20 0052 05/17/20 0416 05/19/20 0939  WBC 12.6* 9.0 7.8 6.1  CREATININE 1.52* 1.31* 1.24 1.17    Estimated Creatinine Clearance: 53.8 mL/min (by C-G formula based on SCr of 1.17 mg/dL).    Allergies  Allergen Reactions  . Amlodipine Other (See Comments)    Edema w/10 mg  . Lorazepam Other (See Comments)    DO NOT GIVE BENZO TO PARKINSON PT WITH CONCERNS FOR LEWY BODY DEMENTIA, CAUSES PARADOXICAL WORSENING OF THEIR AGITATION!  . Penicillins Hives    Has patient had a PCN reaction causing immediate rash, facial/tongue/throat swelling, SOB or lightheadedness with hypotension: Yes Has patient had a PCN reaction causing severe rash involving mucus membranes or skin necrosis: No Has patient had a PCN reaction that required hospitalization No Has patient had a PCN reaction occurring within the last 10 years: No If all of the above answers are "NO", then may proceed with Cephalosporin use.   . Sulfonamide Derivatives Hives  . Atorvastatin     REACTION: leg cramps  . Sulfa Antibiotics Other (See Comments) and Palpitations    Sweating, passed out  . Telmisartan Other (See Comments)    ?fatigue    Antimicrobials this admission: Cefepime 11/22>> Flagyl 11/22>>   Microbiology results: 11/22 BCX no growth 5  days 11/17 resp panel: COVID pos  Thank you for allowing pharmacy to be a part of this patient's care.   12/17, PharmD PGY1 Pharmacy Resident 05/21/2020 10:15 AM

## 2020-05-22 ENCOUNTER — Inpatient Hospital Stay (HOSPITAL_COMMUNITY): Payer: Medicare Other

## 2020-05-22 DIAGNOSIS — I10 Essential (primary) hypertension: Secondary | ICD-10-CM | POA: Diagnosis not present

## 2020-05-22 DIAGNOSIS — G9340 Encephalopathy, unspecified: Secondary | ICD-10-CM | POA: Diagnosis not present

## 2020-05-22 LAB — COMPREHENSIVE METABOLIC PANEL
ALT: 8 U/L (ref 0–44)
AST: 21 U/L (ref 15–41)
Albumin: 1.9 g/dL — ABNORMAL LOW (ref 3.5–5.0)
Alkaline Phosphatase: 36 U/L — ABNORMAL LOW (ref 38–126)
Anion gap: 6 (ref 5–15)
BUN: 38 mg/dL — ABNORMAL HIGH (ref 8–23)
CO2: 26 mmol/L (ref 22–32)
Calcium: 8.2 mg/dL — ABNORMAL LOW (ref 8.9–10.3)
Chloride: 103 mmol/L (ref 98–111)
Creatinine, Ser: 1.24 mg/dL (ref 0.61–1.24)
GFR, Estimated: 59 mL/min — ABNORMAL LOW (ref >60)
Glucose, Bld: 98 mg/dL (ref 70–99)
Potassium: 3.8 mmol/L (ref 3.5–5.1)
Sodium: 135 mmol/L (ref 135–145)
Total Bilirubin: 0.4 mg/dL (ref 0.3–1.2)
Total Protein: 4.6 g/dL — ABNORMAL LOW (ref 6.5–8.1)

## 2020-05-22 LAB — CBC WITH DIFFERENTIAL/PLATELET
Abs Immature Granulocytes: 0.14 10*3/uL — ABNORMAL HIGH (ref 0.00–0.07)
Basophils Absolute: 0.1 10*3/uL (ref 0.0–0.1)
Basophils Relative: 1 %
Eosinophils Absolute: 0.1 10*3/uL (ref 0.0–0.5)
Eosinophils Relative: 2 %
HCT: 26.8 % — ABNORMAL LOW (ref 39.0–52.0)
Hemoglobin: 9 g/dL — ABNORMAL LOW (ref 13.0–17.0)
Immature Granulocytes: 2 %
Lymphocytes Relative: 31 %
Lymphs Abs: 1.8 10*3/uL (ref 0.7–4.0)
MCH: 30.3 pg (ref 26.0–34.0)
MCHC: 33.6 g/dL (ref 30.0–36.0)
MCV: 90.2 fL (ref 80.0–100.0)
Monocytes Absolute: 0.7 10*3/uL (ref 0.1–1.0)
Monocytes Relative: 13 %
Neutro Abs: 3 10*3/uL (ref 1.7–7.7)
Neutrophils Relative %: 51 %
Platelets: 245 10*3/uL (ref 150–400)
RBC: 2.97 MIL/uL — ABNORMAL LOW (ref 4.22–5.81)
RDW: 12.4 % (ref 11.5–15.5)
WBC: 5.9 10*3/uL (ref 4.0–10.5)
nRBC: 0 % (ref 0.0–0.2)

## 2020-05-22 LAB — GLUCOSE, CAPILLARY
Glucose-Capillary: 84 mg/dL (ref 70–99)
Glucose-Capillary: 89 mg/dL (ref 70–99)
Glucose-Capillary: 96 mg/dL (ref 70–99)
Glucose-Capillary: 89 mg/dL (ref 70–99)
Glucose-Capillary: 79 mg/dL (ref 70–99)

## 2020-05-22 LAB — MAGNESIUM: Magnesium: 1.9 mg/dL (ref 1.7–2.4)

## 2020-05-22 LAB — PROCALCITONIN: Procalcitonin: <0.1 ng/mL

## 2020-05-22 LAB — BRAIN NATRIURETIC PEPTIDE: B Natriuretic Peptide: 54.9 pg/mL (ref 0.0–100.0)

## 2020-05-22 LAB — CORTISOL-AM, BLOOD: Cortisol - AM: 11.4 ug/dL (ref 6.7–22.6)

## 2020-05-22 LAB — C-REACTIVE PROTEIN: CRP: 0.6 mg/dL (ref <1.0)

## 2020-05-22 LAB — D-DIMER, QUANTITATIVE: D-Dimer, Quant: 1.76 ug/mL-FEU — ABNORMAL HIGH (ref 0.00–0.50)

## 2020-05-22 MED ORDER — HYDRALAZINE HCL 20 MG/ML IJ SOLN
10.0000 mg | Freq: Four times a day (QID) | INTRAMUSCULAR | Status: DC | PRN
  Administered 2020-05-22 – 2020-05-31 (×7): 10 mg via INTRAVENOUS
  Filled 2020-05-22 (×8): qty 1

## 2020-05-22 MED ORDER — LACTATED RINGERS IV SOLN: INTRAVENOUS | Status: AC

## 2020-05-22 NOTE — Progress Notes (Signed)
EEG complete - results pending LTM to follow same leads used  

## 2020-05-22 NOTE — Procedures (Addendum)
Patient Name: [name]  MRN: [id number]  Epilepsy Attending: Charlsie Quest  Referring Physician/Provider: Dr. Susa Raring Date: 05/22/2020 Duration: 23.52 minutes  Patient history: 79 year old male with persistent encephalopathy in the setting of Parkinson's disease and dementia.  EEG to evaluate for seizures.  Level of alertness: Awake  AEDs during EEG study: None  Technical aspects: This EEG study was done with scalp electrodes positioned according to the 10-20 International system of electrode placement. Electrical activity was acquired at a sampling rate of 500Hz  and reviewed with a high frequency filter of 70Hz  and a low frequency filter of 1Hz . EEG data were recorded continuously and digitally stored.   Description: The posterior dominant rhythm consists of 8Hz  activity of moderate voltage (25-35 uV) seen predominantly in posterior head regions, symmetric and reactive to eye opening and eye closing. EEG showed continuous generalized 3 to 6 Hz theta-delta slowing. Hyperventilation and photic stimulation were not performed.     ABNORMALITY - Continuous slow, generalized  IMPRESSION: This study is suggestive of mild diffuse encephalopathy, nonspecific etiology. No seizures or epileptiform discharges were seen throughout the recording.  Sanav Remer 

## 2020-05-22 NOTE — Progress Notes (Signed)
PROGRESS NOTE    [name]  IWO:[id number] DOB: 11/18/1940 DOA: 05/10/2020 PCP: [name], MD  Brief Narrative - 79 y.o.malewith medical history significant ofBPH, venous insufficiency, CKD, Parkinson's dementia, hypertension, falls, neutropenia, thrombocytopenia, recent admission(from 11/10-11/16 due to altered mental status, findings of old CVA, syncope, incidental Covid infection)who presents from his rehab facility with altered mental status, appears to be having fluctuating mental status, neurology work-up this admission is as significant including MRI, neurology were consulted, and they reported this is expected fluctuation if his progressive Parkinson disease and dementia, and recommendation is for him to continue his Parkinson meds, so core track tube was inserted, mentation has significantly improved, initially obtunded and unresponsive, currently communicative, but remains unsafe to swallow, on 11/20 2 AM patient was febrile, chest x-ray significant for pneumonia, bacterial, aspiration for which she is started on antibiotics.  Assessment & Plan:   #1.  Acute encephalopathy in the setting of Parkinson's disease and dementia.  Patient was recently admitted with similar complaints had extensive work-up including MRI of the brain showing an old infarct, echo, EEG, carotid ultrasound were all nonacute.  A1c was normal LDL was 90.  He was started on statin and aspirin.  This admission CT of the head stable UA normal.  He was seen by neurology.  He has a fluctuating mental status given it is natural progression of his illness.  Parkinson's medications are being continued through the core track tube.    Patient showed improvement in mental status last admission was sent to SNF but soon thereafter became confused again was sent back to the hospital.  This admission he was treated for aspiration pneumonia, he patient is being supported by core track feeding.  His mentation, continues  to be poor, he is unable to open his eyes but will mumble a few words.  So far this was being thought to be progression of his Parkinson's dementia, however wife is very clear that on 05/02/2020 a day before previous hospitalization he was leading a normal life and was quite active.  His cefepime and Exelon patch were discontinued on 05/21/2020, repeat MRI was nonacute, neurology has been reconsulted, long-term EEG being done on 05/22/2020.  Mentation has improved somewhat.     #2  pneumonia HCAP versus aspiration chest x-ray with bibasilar infiltrates.  On cefepime and Flagyl since 05/15/2020.  Stop 11/28.  #3 history of stroke continue aspirin and statin via NG tube.  #4 history of CKD stage III with AKI in the setting of decreased p.o. intake.  Improved  with IV fluids.  His baseline creatinine is 1.5.  Creatinine is 1.14  #5 Parkinson's disease with dementia followed by Dr. Karel Jarvis  #6 hypokalemia/hypophosphatemia-repleted and resolved.  #7 bilateral upper extremity edema Doppler shows no evidence of DVT.  #8 COVID-19 infection patient received monoclonal antibody 05/04/2020.  He also finished 10 days of isolation.  #9 FEN patient still has core track tube in place.  He was started on dysphagia 1 diet 05/18/2020, will hold tube feeds and challenge oral diet on 05/22/2020, gentle IV fluids today.  Once neurological evaluation is done we will readdress if he requires PEG tube.  Nutrition Problem: Inadequate oral intake Etiology: lethargy/confusion, inability to eat  Signs/Symptoms: NPO status  Interventions: Tube feeding, Prostat  Estimated body mass index is 28.96 kg/m as calculated from the following:   Height as of this encounter: 5\' 8"  (1.727 m).   Weight as of this encounter: 86.4 kg.  DVT prophylaxis: Lovenox  Code Status: Full code Family Communication: Discussed with patient's wife bedside & daughter at [phone] - 05/21/20 Disposition Plan:  Status is:  Inpatient   Dispo: The patient is from: SNF              Anticipated d/c is to: SNF              Anticipated d/c date is: 2 to 3 days              Patient currently is not medically stable to d/c.   Consultants:  Neurology saw her on 05/12/2020 Procedures: None Antimicrobials:  Anti-infectives (From admission, onward)   Start     Dose/Rate Route Frequency Ordered Stop   05/15/20 0815  ceFEPIme (MAXIPIME) 2 g in sodium chloride 0.9 % 100 mL IVPB  Status:  Discontinued        2 g 200 mL/hr over 30 Minutes Intravenous Every 12 hours 05/15/20 0725 05/21/20 1036   05/15/20 0800  metroNIDAZOLE (FLAGYL) 50 mg/ml oral suspension 500 mg  Status:  Discontinued        500 mg Per Tube 3 times daily 05/15/20 0708 05/21/20 1036       Subjective:  Patient in bed and comfortable, slightly more awake today, moving all 4 extremities upon request, denies any headache chest or abdominal pain.  Objective:  Vitals:   05/21/20 1703 05/21/20 1948 05/22/20 0300 05/22/20 0741  BP: (!) 179/75 (!) 174/62 (!) 158/67 (!) 161/69  Pulse: 70 66 60 61  Resp: 18 17 16 19   Temp: 98.2 F (36.8 C) 98.4 F (36.9 C) 98 F (36.7 C) 97.8 F (36.6 C)  TempSrc: Oral Oral Axillary Oral  SpO2: 98% 100% 97% 98%  Weight:      Height:        Intake/Output Summary (Last 24 hours) at 05/22/2020 0926 Last data filed at 05/22/2020 0155 Gross per 24 hour  Intake 266.69 ml  Output 950 ml  Net -683.31 ml   Filed Weights   05/18/20 0500 05/19/20 0505 05/21/20 0700  Weight: 89.6 kg 88.5 kg 86.4 kg    Examination:     Awake less somnolent today and will open his eyes upon being prompted denies any headache or chest pain, will move it up upper extremities a little bit but not reliably following commands Edison.AT,  Supple Neck,No JVD, No cervical lymphadenopathy appriciated.  Symmetrical Chest wall movement, Good air movement bilaterally, CTAB RRR,No Gallops, Rubs or new Murmurs, No Parasternal Heave +ve B.Sounds,  Abd Soft, No tenderness, No organomegaly appriciated, No rebound - guarding or rigidity. No Cyanosis, 1+ edema     Data Reviewed: I have personally reviewed following labs and imaging studies  CBC: Recent Labs  Lab 05/16/20 0052 05/17/20 0416 05/19/20 0939 05/22/20 0215  WBC 9.0 7.8 6.1 5.9  NEUTROABS  --   --   --  3.0  HGB 9.2* 9.6* 9.3* 9.0*  HCT 27.5* 28.2* 27.7* 26.8*  MCV 89.6 88.4 90.2 90.2  PLT 242 298 267 245   Basic Metabolic Panel: Recent Labs  Lab 05/16/20 0052 05/17/20 0416 05/19/20 0939 05/22/20 0215  NA 140 138 138 135  K 4.0 4.4 4.7 3.8  CL 109 108 109 103  CO2 23 23 23 26   GLUCOSE 142* 137* 114* 98  BUN 34* 29* 36* 38*  CREATININE 1.31* 1.24 1.17 1.24  CALCIUM 8.2* 8.4* 8.3* 8.2*  MG  --   --   --  1.9  PHOS 2.1*  --   --   --  GFR: Estimated Creatinine Clearance: 50.8 mL/min (by C-G formula based on SCr of 1.24 mg/dL). Liver Function Tests: Recent Labs  Lab 05/16/20 0052 05/19/20 0939 05/22/20 0215  AST 8* 12* 21  ALT 7 5 8   ALKPHOS 42 42 36*  BILITOT 0.6 0.5 0.4  PROT 4.6* 4.6* 4.6*  ALBUMIN 2.0* 1.9* 1.9*   No results for input(s): LIPASE, AMYLASE in the last 168 hours. No results for input(s): AMMONIA in the last 168 hours. Coagulation Profile: No results for input(s): INR, PROTIME in the last 168 hours. Cardiac Enzymes: No results for input(s): CKTOTAL, CKMB, CKMBINDEX, TROPONINI in the last 168 hours. BNP (last 3 results) No results for input(s): PROBNP in the last 8760 hours. HbA1C: No results for input(s): HGBA1C in the last 72 hours. CBG: Recent Labs  Lab 05/21/20 1645 05/21/20 2101 05/22/20 0051 05/22/20 0311 05/22/20 0750  GLUCAP 95 83 84 89 96   Lipid Profile: No results for input(s): CHOL, HDL, LDLCALC, TRIG, CHOLHDL, LDLDIRECT in the last 72 hours. Thyroid Function Tests: Recent Labs    05/21/20 1116  TSH 4.788*   Anemia Panel: No results for input(s): VITAMINB12, FOLATE, FERRITIN, TIBC, IRON,  RETICCTPCT in the last 72 hours. Sepsis Labs: Recent Labs  Lab 05/16/20 0052 05/17/20 0416 05/22/20 0215  PROCALCITON 0.50 0.28 <0.10    Recent Results (from the past 240 hour(s))  Culture, blood (routine x 2)     Status: None   Collection Time: 05/15/20  9:13 AM   Specimen: BLOOD  Result Value Ref Range Status   Specimen Description BLOOD LEFT ANTECUBITAL  Final   Special Requests   Final    BOTTLES DRAWN AEROBIC AND ANAEROBIC Blood Culture results may not be optimal due to an inadequate volume of blood received in culture bottles   Culture   Final    NO GROWTH 5 DAYS Performed at [facility], [address], [place], [place]    Report Status 05/20/2020 FINAL  Final  Culture, blood (routine x 2)     Status: None   Collection Time: 05/15/20  9:13 AM   Specimen: BLOOD  Result Value Ref Range Status   Specimen Description BLOOD BLOOD LEFT HAND  Final   Special Requests   Final    BOTTLES DRAWN AEROBIC AND ANAEROBIC Blood Culture results may not be optimal due to an inadequate volume of blood received in culture bottles   Culture   Final    NO GROWTH 5 DAYS Performed at [facility], [address], [place], [place]    Report Status 05/20/2020 FINAL  Final         Radiology Studies: MR BRAIN WO CONTRAST  Result Date: 05/21/2020 CLINICAL DATA:  Delirium. EXAM: MRI HEAD WITHOUT CONTRAST TECHNIQUE: Multiplanar, multiecho pulse sequences of the brain and surrounding structures were obtained without intravenous contrast. COMPARISON:  05/11/2020 FINDINGS: Brain: There is no evidence of an acute infarct, mass, midline shift, or extra-axial fluid collection. There may be a single chronic microhemorrhage in the right cerebellum. Patchy and early confluent T2 hyperintensities in the cerebral white matter are unchanged and nonspecific but compatible with moderate chronic small vessel ischemic disease. Mild chronic small vessel changes are present in the  pons. Ventricular enlargement is unchanged and favored to be secondary to cerebral atrophy rather than hydrocephalus with note again made of severe bilateral mesial temporal lobe atrophy. Vascular: Major intracranial vascular flow voids are preserved. Skull and upper cervical spine: Unremarkable bone  marrow signal. Sinuses/Orbits: Right cataract extraction. Mild mucosal thickening in the paranasal sinuses. Trace right mastoid effusion. Other: None. IMPRESSION: 1. No acute intracranial abnormality. 2. Moderate chronic small vessel ischemic disease and temporal lobe predominant cerebral atrophy. Electronically Signed   By: Sebastian Ache M.D.   On: 05/21/2020 18:03   DG Chest Port 1 View  Result Date: 05/21/2020 CLINICAL DATA:  Shortness of breath EXAM: PORTABLE CHEST 1 VIEW COMPARISON:  May 14, 2020. FINDINGS: Feeding tube tip is below the diaphragm, likely in the distal stomach or proximal duodenum region. No pneumothorax. Small pleural effusions noted bilaterally. No edema or airspace opacity appreciable. Heart is upper normal in size with pulmonary vascularity normal. No adenopathy. There is aortic atherosclerosis. No bone lesions. IMPRESSION: Feeding tube as described. Small layering pleural effusions bilaterally. No edema or airspace opacity. Heart upper normal in size. Aortic Atherosclerosis (ICD10-I70.0). Electronically Signed   By: Bretta Bang III M.D.   On: 05/21/2020 08:41    Scheduled Meds: . aspirin  81 mg Per Tube Daily  . carbidopa-levodopa  1 tablet Per Tube TID WC  . carvedilol  6.25 mg Per Tube BID WC  . cholecalciferol  1,000 Units Per Tube Daily  . enoxaparin (LOVENOX) injection  40 mg Subcutaneous Daily  . feeding supplement  237 mL Oral BID BM  . feeding supplement (OSMOLITE 1.5 CAL)  960 mL Per Tube Q24H  . feeding supplement (PROSource TF)  90 mL Per Tube BID  . free water  155 mL Per Tube Q4H  . sodium chloride flush  3 mL Intravenous Q12H  . vitamin B-12  1,000 mcg  Per Tube Daily   Continuous Infusions: . lactated ringers       LOS: 12 days     Susa Raring, MD  05/22/2020, 9:26 AM

## 2020-05-22 NOTE — Progress Notes (Signed)
LTM EEG hooked up and running - no initial skin breakdown - push button tested - neuro notified.same leads used  [facility] monitor

## 2020-05-22 NOTE — Progress Notes (Signed)
SLP Cancellation Note  Patient Details Name: [name] MRN: [id number] DOB: [birth year]   Cancelled treatment:  Pt in procedure. Will continue efforts.  Symphani Eckstrom L. Samson Frederic, MA CCC/SLP Acute Rehabilitation Services Office number [phone] Pager [phone]          Blenda Mounts Laurice 05/22/2020, 9:35 AM

## 2020-05-23 DIAGNOSIS — G9340 Encephalopathy, unspecified: Secondary | ICD-10-CM | POA: Diagnosis not present

## 2020-05-23 DIAGNOSIS — I10 Essential (primary) hypertension: Secondary | ICD-10-CM | POA: Diagnosis not present

## 2020-05-23 LAB — COMPREHENSIVE METABOLIC PANEL
ALT: 19 U/L (ref 0–44)
AST: 25 U/L (ref 15–41)
Albumin: 2.2 g/dL — ABNORMAL LOW (ref 3.5–5.0)
Alkaline Phosphatase: 48 U/L (ref 38–126)
Anion gap: 7 (ref 5–15)
BUN: 36 mg/dL — ABNORMAL HIGH (ref 8–23)
CO2: 27 mmol/L (ref 22–32)
Calcium: 8.3 mg/dL — ABNORMAL LOW (ref 8.9–10.3)
Chloride: 100 mmol/L (ref 98–111)
Creatinine, Ser: 1.09 mg/dL (ref 0.61–1.24)
GFR, Estimated: >60 mL/min (ref >60)
Glucose, Bld: 144 mg/dL — ABNORMAL HIGH (ref 70–99)
Potassium: 3.6 mmol/L (ref 3.5–5.1)
Sodium: 134 mmol/L — ABNORMAL LOW (ref 135–145)
Total Bilirubin: 0.5 mg/dL (ref 0.3–1.2)
Total Protein: 5.2 g/dL — ABNORMAL LOW (ref 6.5–8.1)

## 2020-05-23 LAB — CBC WITH DIFFERENTIAL/PLATELET
Abs Immature Granulocytes: 0.07 10*3/uL (ref 0.00–0.07)
Basophils Absolute: 0.1 10*3/uL (ref 0.0–0.1)
Basophils Relative: 1 %
Eosinophils Absolute: 0.1 10*3/uL (ref 0.0–0.5)
Eosinophils Relative: 2 %
HCT: 27.7 % — ABNORMAL LOW (ref 39.0–52.0)
Hemoglobin: 9.4 g/dL — ABNORMAL LOW (ref 13.0–17.0)
Immature Granulocytes: 1 %
Lymphocytes Relative: 22 %
Lymphs Abs: 1.3 10*3/uL (ref 0.7–4.0)
MCH: 29.7 pg (ref 26.0–34.0)
MCHC: 33.9 g/dL (ref 30.0–36.0)
MCV: 87.4 fL (ref 80.0–100.0)
Monocytes Absolute: 0.6 10*3/uL (ref 0.1–1.0)
Monocytes Relative: 10 %
Neutro Abs: 3.6 10*3/uL (ref 1.7–7.7)
Neutrophils Relative %: 64 %
Platelets: 287 10*3/uL (ref 150–400)
RBC: 3.17 MIL/uL — ABNORMAL LOW (ref 4.22–5.81)
RDW: 12.2 % (ref 11.5–15.5)
WBC: 5.7 10*3/uL (ref 4.0–10.5)
nRBC: 0 % (ref 0.0–0.2)

## 2020-05-23 LAB — GLUCOSE, CAPILLARY
Glucose-Capillary: 100 mg/dL — ABNORMAL HIGH (ref 70–99)
Glucose-Capillary: 113 mg/dL — ABNORMAL HIGH (ref 70–99)
Glucose-Capillary: 82 mg/dL (ref 70–99)
Glucose-Capillary: 89 mg/dL (ref 70–99)

## 2020-05-23 LAB — BRAIN NATRIURETIC PEPTIDE: B Natriuretic Peptide: 170.7 pg/mL — ABNORMAL HIGH (ref 0.0–100.0)

## 2020-05-23 LAB — PROCALCITONIN: Procalcitonin: <0.1 ng/mL

## 2020-05-23 LAB — D-DIMER, QUANTITATIVE: D-Dimer, Quant: 2.24 ug/mL-FEU — ABNORMAL HIGH (ref 0.00–0.50)

## 2020-05-23 LAB — MAGNESIUM: Magnesium: 2 mg/dL (ref 1.7–2.4)

## 2020-05-23 LAB — C-REACTIVE PROTEIN: CRP: 0.6 mg/dL (ref <1.0)

## 2020-05-23 NOTE — Progress Notes (Signed)
Neurology Progress Note  S: Appears to be somewhat improving-in terms of mentation per primary team f/up waxing and waning mental status, EEG, MRI.    O: Current vital signs: BP (!) 149/69 (BP Location: Left Arm)   Pulse 68   Temp 98.6 F (37 C) (Axillary)   Resp 16   Ht 5\' 8"  (1.727 m)   Wt 85 kg   SpO2 100%   BMI 28.49 kg/m  Vital signs in last 24 hours: Temp:  [97.5 F (36.4 C)-98.6 F (37 C)] 98.6 F (37 C) (11/30 1201) Pulse Rate:  [60-77] 68 (11/30 1201) Resp:  [16-20] 16 (11/30 1201) BP: (127-171)/(59-75) 149/69 (11/30 1201) SpO2:  [99 %-100 %] 100 % (11/30 1201) Weight:  [85 kg-85.5 kg] 85 kg (11/30 0400) GENERAL: Awake, keeps eyes closed. NAD HEENT: Normocephalic and atraumatic, dry mm, no LN++ LUNGS: respiratory effort is normal CV: RRR ABDOMEN: Soft Perfusion: extremities are warm.  NEURO:  Mental Status: Awake. Oriented to his name only.  Language: speech is clear.  Comprehension is slowed and he follows some commands but not others. Mild to moderate dysarthria. Cranial Nerves: PERRL 42mm/brisk. Will not cooperate for EOMI, visual fields. No facial asymmetry, hearing intact to voice.  tongue/uvula/soft palate midline, No participation in sternocleidal exam. No evidence of tongue atrophy or fasciculations.  Motor: Spontaneous movement of all 4 extremities. Tone: cog wheeling noted to RUE. Able to lift both legs off bed. Will not cooperate to lift arms on command.Grips 4+ BUE.  Sensation- Intact to light touch bilaterally Coordination: Will not follow commands for testing.  Gait- deferred  Medications  Current Facility-Administered Medications:  .  acetaminophen (TYLENOL) 160 MG/5ML solution 650 mg, 650 mg, Per Tube, Q6H PRN, Elgergawy, Dawood S, MD .  aspirin chewable tablet 81 mg, 81 mg, Per Tube, Daily, Elgergawy, 1m, MD, 81 mg at 05/23/20 1003 .  carbidopa-levodopa (SINEMET IR) 25-100 MG per tablet immediate release 1 tablet, 1 tablet, Per Tube, TID  WC, Elgergawy, 05/25/20, MD, 1 tablet at 05/23/20 1003 .  carvedilol (COREG) tablet 6.25 mg, 6.25 mg, Per Tube, BID WC, Elgergawy, 05/25/20, MD, 6.25 mg at 05/23/20 1002 .  cholecalciferol (VITAMIN D3) tablet 1,000 Units, 1,000 Units, Per Tube, Daily, Elgergawy, 05/25/20, MD, 1,000 Units at 05/23/20 1003 .  enoxaparin (LOVENOX) injection 40 mg, 40 mg, Subcutaneous, Daily, 05/25/20 A, RPH, 40 mg at 05/23/20 1009 .  feeding supplement (ENSURE ENLIVE / ENSURE PLUS) liquid 237 mL, 237 mL, Oral, BID BM, 05/25/20, MD, 90 mL at 05/22/20 2226 .  feeding supplement (OSMOLITE 1.5 CAL) liquid 960 mL, 960 mL, Per Tube, Q24H, 2227, MD, Last Rate: 80 mL/hr at 05/22/20 2315, 960 mL at 05/22/20 2315 .  feeding supplement (PROSource TF) liquid 90 mL, 90 mL, Per Tube, BID, 05/24/20, MD, 90 mL at 05/23/20 1009 .  free water 155 mL, 155 mL, Per Tube, Q4H, Elgergawy, 05/25/20, MD, 155 mL at 05/23/20 1048 .  hydrALAZINE (APRESOLINE) injection 10 mg, 10 mg, Intravenous, Q6H PRN, 05/25/20, MD, 10 mg at 05/22/20 1333 .  sodium chloride flush (NS) 0.9 % injection 3 mL, 3 mL, Intravenous, Q12H, 05/24/20, MD, 3 mL at 05/22/20 2228 .  vitamin B-12 (CYANOCOBALAMIN) tablet 1,000 mcg, 1,000 mcg, Per Tube, Daily, Elgergawy, 2229, MD, 1,000 mcg at 05/23/20 1003 Labs CBC    Component Value Date/Time   WBC 5.7 05/23/2020 0645   RBC 3.17 (L)  05/23/2020 0645   HGB 9.4 (L) 05/23/2020 0645   HCT 27.7 (L) 05/23/2020 0645   PLT 287 05/23/2020 0645   MCV 87.4 05/23/2020 0645   MCH 29.7 05/23/2020 0645   MCHC 33.9 05/23/2020 0645   RDW 12.2 05/23/2020 0645   LYMPHSABS 1.3 05/23/2020 0645   MONOABS 0.6 05/23/2020 0645   EOSABS 0.1 05/23/2020 0645   BASOSABS 0.1 05/23/2020 0645    CMP     Component Value Date/Time   NA 134 (L) 05/23/2020 0645   K 3.6 05/23/2020 0645   CL 100 05/23/2020 0645   CO2 27 05/23/2020 0645   GLUCOSE 144 (H) 05/23/2020 0645   BUN  36 (H) 05/23/2020 0645   CREATININE 1.09 05/23/2020 0645   CREATININE 1.55 (H) 01/17/2020 1535   CALCIUM 8.3 (L) 05/23/2020 0645   PROT 5.2 (L) 05/23/2020 0645   ALBUMIN 2.2 (L) 05/23/2020 0645   AST 25 05/23/2020 0645   ALT 19 05/23/2020 0645   ALKPHOS 48 05/23/2020 0645   BILITOT 0.5 05/23/2020 0645   GFRNONAA >60 05/23/2020 0645   GFRNONAA 42 (L) 01/17/2020 1535   GFRAA 48 (L) 01/17/2020 1535    Lipid Panel     Component Value Date/Time   CHOL 139 05/05/2020 0401   TRIG 52 05/05/2020 0401   HDL 39 (L) 05/05/2020 0401   CHOLHDL 3.6 05/05/2020 0401   VLDL 10 05/05/2020 0401   LDLCALC 90 05/05/2020 0401   LDLDIRECT 114.0 12/30/2016 1031     Imaging I have reviewed images in epic and the results pertinent to this consultation are: CT Head showed showed nothing acute.  MRI Brain 11/28 showed moderate chronic small vessel ischemia in temporal lobe with cerebral atrophy (no change when compared to MRI on 11/18.  EEG with mild diffuse encephalopathy. No seizure activity or epileptiform activity (unchanged since EEG on 11/12.   Assessment: 79 yo male with PMHx of Parkinson's disease with dementia, remote CVA, HTN, CKD, falls, BPH, and venous insufficiency. Mr. Sheriff is familiar to neuro as we followed him at last visit (11/10-11/16) and now on readmission on 11/17 to present. On his 11/10 admission, he was here for AMS, syncope. After MRI, CT head and EEG, neuro thought his AMS was waxing and waning per his chronic PD and dementia. Pt was released to SNF, but returned the next day for similar sx as 11/10 admission.  On this admission, his MRI brain is unchanged, EEG unchanged, CT head unchanged and non acute. On this admission he has been treated for aspiration PNA and finished course of tx. He was NPO with meds per [place], and is now on Dysphagia I challenge diet. His Cefepime was d/c'd on advice from neuro due to effect on mental status. His Exelon was also d/c'd and his mental  status has slowly improved. Per attending, wife was holding to the fact that before 11/9 pt was able to be independent, but she has now backed off of that. Attending has spoken to family today and they are leaning towards comfort care if no improvement and have decided against PEG. Palliative care has been consulted.   Impression:  1. Acute on chronic encephalopathy as evidenced by recent admissions and waxing and waning of mental status. EEG and MRI brain neg for acute. Believe his sx due to Parkinson's and dementia progression.  2. EEG neg-do not believe this presentation is due to seizures.   Recommendations:  1. Agree with palliative care consult. 2. Continue Sinemet.  3. No  further studies indicated. No need for AEDs.   We will be available as needed. Please call with questions.  Jimmye Norman, NP. Note will be edited as needed by Dr. Milon Dikes.    Jimmye Norman, MSN, APN-BC/Nurse Practitioner Pager: [phone]  Attending addendum Patient seen and examined at the request of internal medicine-Dr. Thedore Mins. Concern for acute on chronic encephalopathy and waxing and waning mental status. Patient with a known Parkinson's and neurodegenerative process along with dementia that has been slowly progressive. He has had multiple admissions in the past for altered mental status, syncope-imaging and EEG have been unremarkable. Does show evidence of generalized atrophy on his MRI that I personally reviewed. On this admission he has been treated for aspiration pneumonia, finished course of antibiotic treatments. He has been n.p.o. and has NG tube for feeding. Primary team wanted to get a sense of his neurological function as well as prospects of meaningful neurological recovery going forward. Although I am only familiar with his care for this brief encounter, going through his chart, it seems like he has been on a pretty steady decline from a neurocognitive perspective and it is unlikely that  intervention such as putting in a PEG tube would change his neurological outcome in any meaningful way. I had a detailed discussion at bedside with the daughter who also seem to be in agreement. She is going to talk it over with her mother. I would agree with recommendations provided by Jimmye Norman, NP. I have discussed the case in detail with Dr. Thedore Mins.  -- Milon Dikes, MD [facility] Pager: [phone] If 7pm to 7am, please call on call as listed on [facility].

## 2020-05-23 NOTE — Procedures (Signed)
Patient Name:[name] MVE:[id number] Epilepsy Attending:Haylen Shelnutt Annabelle Harman Referring Physician/Provider:Dr. Onalee Hua Duration: 05/23/1020 1017 to 05/23/2020 1523  Patient history:79 year old male with persistent encephalopathy in the setting of Parkinson's disease and dementia. EEG to evaluate for seizures.  Level of alertness:Awake, asleep  AEDs during EEG study:None  Technical aspects: This EEG study was done with scalp electrodes positioned according to the 10-20 International system of electrode placement. Electrical activity was acquired at a sampling rate of 500Hz  and reviewed with a high frequency filter of 70Hz  and a low frequency filter of 1Hz . EEG data were recorded continuously and digitally stored.   Description: The posterior dominant rhythm consists of8Hz  activity of moderate voltage (25-35 uV) seen predominantly in posterior head regions, symmetric and reactive to eye opening and eye closing.  Sleep was characterized by vertex is currently 10 (12-14 at), maximal frontocentral region.  EEG showedcontinuousgeneralized 3 to 6 Hz theta-delta slowing. Hyperventilation and photic stimulation were not performed.   ABNORMALITY - Continuousslow, generalized  IMPRESSION: This study is suggestive of mild diffuse encephalopathy, nonspecific etiology.No seizures or epileptiform discharges were seen throughout the recording.  Sita Mangen 

## 2020-05-23 NOTE — Progress Notes (Signed)
Occupational Therapy Treatment Patient Details Name: [name] MRN: [id number] DOB: [birth year] Today's Date: 05/23/2020    History of present illness 79yo male with recent admit 11/10 to 11/16 due to AMS/syncope/incidental Covid, now returning to the ED from his SNF with AMS and non-responsiveness. Admitted with acute encephalopathy and parkinsons dementia in the context of Covid. 11/26 noted bil UE weeping with blistersPMH HLD, HTN, parasomnia, CKD, parkinsons dementia, falls   OT comments  Patient with improved progress towards goals in session to date as pt more alert and responsive. Patient's session encompassed sitting EOB with PT and OT due to EEG test being administered during session. Pt requiring increased time to follow commands, but able to follow 75% of the time before getting distracted. Pt with steady lean to the R with increased assist needed (mod A of 2) in order to lean on L to achieve midline, which patient could maintain for 30 seconds before reverting back to R. Pt with need for frequent cues to keep eyes open during session, and fatiguing quickly after sitting EOB for approximately 15 minutes. Discharge remains appropriate, therapy to continue to follow.    Follow Up Recommendations  SNF    Equipment Recommendations  None recommended by OT    Recommendations for Other Services      Precautions / Restrictions Precautions Precautions: Fall Restrictions Weight Bearing Restrictions: No       Mobility Bed Mobility Overal bed mobility: Needs Assistance Bed Mobility: Supine to Sit;Sit to Supine     Supine to sit: Max assist;+2 for physical assistance;HOB elevated Sit to supine: Max assist;+2 for physical assistance;+2 for safety/equipment   General bed mobility comments: pt in upright position in bed on arrival. Pt able to assist with moving legs towards left EOB and assisted with raising torso. Pt was unable to further scoot hips forward toward getting rt foot  on the floor despite multi-modal cues and assist  Transfers                      Balance Overall balance assessment: Needs assistance Sitting-balance support: Feet supported Sitting balance-Leahy Scale: Poor Sitting balance - Comments: leans to rt (increases with fatigue)                                   ADL either performed or assessed with clinical judgement   ADL Overall ADL's : Needs assistance/impaired     Grooming: Wash/dry hands;Wash/dry face;Sitting;Set up Grooming Details (indicate cue type and reason): Set up sitting EOB, but required cues for thoroughness                             Functional mobility during ADLs: Maximal assistance;+2 for physical assistance General ADL Comments: pt able to follow commands about 75% of the time with increased time sitting EOB, pt leaning to the R for the majority of time sitting EOB with attempts at nueromuscular re-ed by bringing pt down on L elbow and able to maintain midline for 30 seconds before listing back to the R     Vision       Perception     Praxis      Cognition Arousal/Alertness: Lethargic Behavior During Therapy: [facility] for tasks assessed/performed Overall Cognitive Status: Impaired/Different from baseline Area of Impairment: Attention;Following commands;Problem solving;Awareness  Orientation Level:  (oriented to self; otherwise NT) Current Attention Level: Sustained   Following Commands: Follows one step commands with increased time;Follows one step commands inconsistently   Awareness: Intellectual Problem Solving: Slow processing;Decreased initiation;Requires verbal cues;Requires tactile cues;Difficulty sequencing General Comments: pt easily aroused and maintained eyes open majority of session; delayed responses and at times conversation off-topic and/or unintelligible        Exercises Other Exercises Other Exercises: seated left lateral flexion to  prop on left elbow and then raise back up to sitting   Shoulder Instructions       General Comments Pt undergoing EEG. EOB ~10 minutes working on sitting tolerance, balance, following commands    Pertinent Vitals/ Pain       Pain Assessment: Faces Faces Pain Scale: No hurt  Home Living                                          Prior Functioning/Environment              Frequency  Min 2X/week        Progress Toward Goals  OT Goals(current goals can now be found in the care plan section)  Progress towards OT goals: Progressing toward goals  Acute Rehab OT Goals Patient Stated Goal: agrees wants to get stronger OT Goal Formulation: With patient Time For Goal Achievement: 05/27/20 Potential to Achieve Goals: Fair  Plan Discharge plan remains appropriate;Frequency remains appropriate    Co-evaluation    PT/OT/SLP Co-Evaluation/Treatment: Yes Reason for Co-Treatment: Complexity of the patient's impairments (multi-system involvement);Necessary to address cognition/behavior during functional activity;For patient/therapist safety;To address functional/ADL transfers PT goals addressed during session: Mobility/safety with mobility;Balance OT goals addressed during session: ADL's and self-care;Proper use of Adaptive equipment and DME      AM-PAC OT "6 Clicks" Daily Activity     Outcome Measure   Help from another person eating meals?: A Lot Help from another person taking care of personal grooming?: A Little Help from another person toileting, which includes using toliet, bedpan, or urinal?: Total Help from another person bathing (including washing, rinsing, drying)?: Total Help from another person to put on and taking off regular upper body clothing?: Total Help from another person to put on and taking off regular lower body clothing?: Total 6 Click Score: 9    End of Session    OT Visit Diagnosis: Unsteadiness on feet (R26.81)   Activity  Tolerance Patient tolerated treatment well   Patient Left in bed;with call bell/phone within reach;with bed alarm set   Nurse Communication Mobility status        Time: 5093-2671 OT Time Calculation (min): 28 min  Charges: OT General Charges $OT Visit: 1 Visit OT Treatments $Self Care/Home Management : 8-22 mins  Pollyann Glen E. Yehonatan Grandison, COTA/L Acute Rehabilitation Services [phone]   Cherlyn Cushing 05/23/2020, 4:06 PM

## 2020-05-23 NOTE — Progress Notes (Signed)
LTM maint complete - no skin breakdown under: FP12,FP2 event button tested

## 2020-05-23 NOTE — Care Management Important Message (Signed)
Important Message  Patient Details  Name: [name] MRN: [id number] Date of Birth: [birth year]   Medicare Important Message Given:  Yes     [name] 05/23/2020, 1:38 PM

## 2020-05-23 NOTE — Progress Notes (Signed)
  Speech Language Pathology Treatment: Dysphagia  Patient Details Name: [name] MRN: [id number] DOB: 11-12-1940 Today's Date: 05/23/2020 Time: 9628-3662 SLP Time Calculation (min) (ACUTE ONLY): 15 min  Assessment / Plan / Recommendation Clinical Impression  Pt awake and interactive, maintained eyes closed throughout session.  Daughter at bedside.  He accepted multiple sips of water from a straw with excellent airway protection/no concerns for aspiration.  Solid foods were attempted, but he held them in oral cavity with no real effort to masticate and they were removed manually.  Per his dtr, Mrs. Higham will bring in denture adhesive tomorrow - that may allow improved eating, however, difficulty appears related more to cognition than a biomechanical swallowing problem (based on last week's  MBS). SLP will continue to follow.   HPI HPI: 79 y.o. male with a history of Parkinson's disease, dementia, increasing falls at home, HTN, covid-19 vaccinations, and BPH who presented to the ED from home after 2 episodes of loss of consciousness. He has had altered mental status since arrival, so history is limited.age-indeterminate infarct of the posterior left cerebellum was noted      SLP Plan  Continue with current plan of care       Recommendations  Diet recommendations: Dysphagia 1 (puree);Thin liquid Liquids provided via: Straw Medication Administration: Via alternative means Supervision: Full supervision/cueing for compensatory strategies;Trained caregiver to feed patient Compensations: Slow rate;Small sips/bites Postural Changes and/or Swallow Maneuvers: Seated upright 90 degrees                Oral Care Recommendations: Oral care BID SLP Visit Diagnosis: Dysphagia, unspecified (R13.10) Plan: Continue with current plan of care       GO                Blenda Mounts Laurice 05/23/2020, 4:55 PM  Kielyn Kardell L. Samson Frederic, MA CCC/SLP Acute Rehabilitation Services Office  number [phone] Pager [phone]

## 2020-05-23 NOTE — Progress Notes (Signed)
Physical Therapy Treatment Patient Details Name: [name] MRN: [id number] DOB: February 29, 1941 Today's Date: 05/23/2020    History of Present Illness 79yo male with recent admit 11/10 to 11/16 due to AMS/syncope/incidental Covid, now returning to the ED from his SNF with AMS and non-responsiveness. Admitted with acute encephalopathy and parkinsons dementia in the context of Covid. 11/26 noted bil UE weeping with blistersPMH HLD, HTN, parasomnia, CKD, parkinsons dementia, falls    PT Comments    Patient undergoing EEG during session. All leads intact throughout session. Patient more alert and able to participate in EOB mobility--requires +2 max assist. Maintained EOB with up to mod assist for balance (as he fatigued) ~10 minutes. Follows most one step commands. If remains alert, ?try stedy next visit.   Follow Up Recommendations  SNF;Supervision/Assistance - 24 hour     Equipment Recommendations  Wheelchair (measurements PT);Wheelchair cushion (measurements PT);Other (comment) (hoyer lift)    Recommendations for Other Services       Precautions / Restrictions Precautions Precautions: Fall    Mobility  Bed Mobility Overal bed mobility: Needs Assistance Bed Mobility: Supine to Sit;Sit to Supine     Supine to sit: Max assist;+2 for physical assistance;HOB elevated Sit to supine: Max assist;+2 for physical assistance;+2 for safety/equipment   General bed mobility comments: pt in upright position in bed on arrival. Pt able to assist with moving legs towards left EOB and assisted with raising torso. Pt was unable to further scoot hips forward toward getting rt foot on the floor despite multi-modal cues and assist  Transfers                    Ambulation/Gait                 Stairs             Wheelchair Mobility    Modified Rankin (Stroke Patients Only)       Balance Overall balance assessment: Needs assistance Sitting-balance support: Feet  supported Sitting balance-Leahy Scale: Poor Sitting balance - Comments: leans to rt (increases with fatigue)                                    Cognition Arousal/Alertness: Lethargic Behavior During Therapy: WFL for tasks assessed/performed Overall Cognitive Status: Impaired/Different from baseline Area of Impairment: Attention;Following commands;Problem solving;Awareness                 Orientation Level:  (oriented to self; otherwise NT) Current Attention Level: Sustained   Following Commands: Follows one step commands with increased time;Follows one step commands inconsistently   Awareness: Intellectual Problem Solving: Slow processing;Decreased initiation;Requires verbal cues;Requires tactile cues;Difficulty sequencing General Comments: pt easily aroused and maintained eyes open majority of session; delayed responses and at times conversation off-topic and/or unintelligible      Exercises Other Exercises Other Exercises: seated left lateral flexion to prop on left elbow and then raise back up to sitting    General Comments General comments (skin integrity, edema, etc.): Pt undergoing EEG. EOB ~10 minutes working on sitting tolerance, balance, following commands      Pertinent Vitals/Pain Pain Assessment: Faces Faces Pain Scale: No hurt    Home Living                      Prior Function            PT Goals (current  goals can now be found in the care plan section) Acute Rehab PT Goals Patient Stated Goal: agrees wants to get stronger Time For Goal Achievement: 05/26/20 Potential to Achieve Goals: Fair Progress towards PT goals: Progressing toward goals    Frequency    Min 2X/week      PT Plan Current plan remains appropriate    Co-evaluation PT/OT/SLP Co-Evaluation/Treatment: Yes Reason for Co-Treatment: Complexity of the patient's impairments (multi-system involvement);Necessary to address cognition/behavior during  functional activity;For patient/therapist safety;To address functional/ADL transfers PT goals addressed during session: Mobility/safety with mobility;Balance        AM-PAC PT "6 Clicks" Mobility   Outcome Measure  Help needed turning from your back to your side while in a flat bed without using bedrails?: A Lot Help needed moving from lying on your back to sitting on the side of a flat bed without using bedrails?: Total Help needed moving to and from a bed to a chair (including a wheelchair)?: Total Help needed standing up from a chair using your arms (e.g., wheelchair or bedside chair)?: Total Help needed to walk in hospital room?: Total Help needed climbing 3-5 steps with a railing? : Total 6 Click Score: 7    End of Session   Activity Tolerance: Patient limited by fatigue Patient left: in bed;with call bell/phone within reach;with bed alarm set Nurse Communication: Mobility status PT Visit Diagnosis: Other abnormalities of gait and mobility (R26.89);Difficulty in walking, not elsewhere classified (R26.2);Muscle weakness (generalized) (M62.81);History of falling (Z91.81)     Time: 3419-3790 PT Time Calculation (min) (ACUTE ONLY): 29 min  Charges:  $Therapeutic Activity: 8-22 mins                      [name], PT Pager [phone]    Zena Amos 05/23/2020, 3:44 PM

## 2020-05-23 NOTE — Progress Notes (Signed)
Patient resting in bed near end of shift. Patient continues with EEG monitoring. A&O x1. Patient confused and needs reorienting throughout shift. U/O >1L see flow sheet. Patient has osmolite infusing through NG at 80 cc/hr. No BM noted. Patient needs frequent readjusting in bed as he frequently scoots down in the bed. No complaints of pain during shift. Safety measures remain in place and bed locked in lowest position with bed alarm active. Will continue to monitor and SBARR to day shift nurse. Plan is for patient to d/c back to [facility] at end of hospital treatment.

## 2020-05-23 NOTE — Progress Notes (Signed)
LTM  Discontinued; no skin breakdown was seen.

## 2020-05-23 NOTE — Procedures (Addendum)
Patient Name: RHYLEE NUNN  MRN: [id number]  Epilepsy Attending: Charlsie Quest  Referring Physician/Provider: Dr. Onalee Hua Duration:  05/22/1020 1017 to 05/23/2020 1017  Patient history: 79 year old male with persistent encephalopathy in the setting of Parkinson's disease and dementia.  EEG to evaluate for seizures.  Level of alertness: Awake, asleep  AEDs during EEG study: None  Technical aspects: This EEG study was done with scalp electrodes positioned according to the 10-20 International system of electrode placement. Electrical activity was acquired at a sampling rate of 500Hz  and reviewed with a high frequency filter of 70Hz  and a low frequency filter of 1Hz . EEG data were recorded continuously and digitally stored.   Description: The posterior dominant rhythm consists of 8Hz  activity of moderate voltage (25-35 uV) seen predominantly in posterior head regions, symmetric and reactive to eye opening and eye closing.  Sleep was characterized by vertex is currently 10 (12-14 at), maximal frontocentral region.  EEG showed continuous generalized 3 to 6 Hz theta-delta slowing. Hyperventilation and photic stimulation were not performed.     EEG showed electrode artifact after 2230 which made parts of study difficult to interpret.  ABNORMALITY - Continuous slow, generalized  IMPRESSION: This study is suggestive of mild diffuse encephalopathy, nonspecific etiology. No seizures or epileptiform discharges were seen throughout the recording.  Xaria Judon 

## 2020-05-23 NOTE — Plan of Care (Signed)
  Problem: Clinical Measurements: Goal: Ability to maintain clinical measurements within normal limits will improve Outcome: Progressing Goal: Will remain free from infection Outcome: Progressing Goal: Diagnostic test results will improve Outcome: Progressing Goal: Cardiovascular complication will be avoided Outcome: Progressing   Problem: Nutrition: Goal: Adequate nutrition will be maintained Outcome: Progressing   Problem: Coping: Goal: Level of anxiety will decrease Outcome: Progressing   Problem: Skin Integrity: Goal: Risk for impaired skin integrity will decrease Outcome: Progressing   Problem: Education: Goal: Knowledge of General Education information will improve Description: Including pain rating scale, medication(s)/side effects and non-pharmacologic comfort measures Outcome: Not Progressing   Problem: Health Behavior/Discharge Planning: Goal: Ability to manage health-related needs will improve Outcome: Not Progressing   Problem: Activity: Goal: Risk for activity intolerance will decrease Outcome: Not Progressing

## 2020-05-23 NOTE — Progress Notes (Signed)
Nutrition Follow-up  DOCUMENTATION CODES:   Not applicable  INTERVENTION:  Continue Ensure Enlive po BID, each supplement provides 350 kcal and 20 grams of protein  Pending goals of care,  Nocturnal tube feeds via Cortrak NGT using Osmolite 1.5 formula at goal rate of 80 ml/hr x 12 hours (7pm-7am).  Provide 90 ml Prosource TF BID per tube.   Continue 155 ml free water q 4 hours per tube. (MD may adjust as appropriate)   Tube feeding regimen provides 1600 kcal (80% of kcal needs), 104 grams of protein (100% of protein needs), 1660 ml free water.   NUTRITION DIAGNOSIS:   Inadequate oral intake related to lethargy/confusion, inability to eat as evidenced by NPO status; diet advanced; poor po intake  GOAL:   Patient will meet greater than or equal to 90% of their needs; not met  MONITOR:   Diet advancement, Labs, Weight trends, TF tolerance, Skin, I & O's  REASON FOR ASSESSMENT:   Rounds    ASSESSMENT:   79 y.o. male with medical history significant of BPH, venous insufficiency, CKD, Parkinson's dementia, hypertension, falls, neutropenia, thrombocytopenia, recent admission (from 11/10-11/16 due to altered mental status, findings of old CVA, syncope, incidental Covid infection) who presents from his rehab facility with altered mental status. Pt admitted with acute encephalopathy, Parkinson's dementia, and incidental finding of COVID-19 infection.  Pt currently on a dysphagia 1 diet with thin liquids. Nocturnal tube feeds held 11/29 to challenge pt to increase his po intake. Meal completion has been poor at >/=25% despite encouragement. Per MD, Pt with gradual mental status decline. Family leaning towards comfort if no further progress. Family desires no PEG for pt. MD awaiting neurology final opinion.   Labs and medications reviewed.   Diet Order:   Diet Order            DIET - DYS 1 Room service appropriate? No; Fluid consistency: Thin  Diet effective now                  EDUCATION NEEDS:   No education needs have been identified at this time  Skin:  Skin Assessment: Reviewed RN Assessment  Last BM:  11/28  Height:   Ht Readings from Last 1 Encounters:  05/12/20 _0  (1.727 m)    Weight:   Wt Readings from Last 1 Encounters:  05/23/20 85 kg   BMI:  Body mass index is 28.49 kg/m.  Estimated Nutritional Needs:   Kcal:  2000-2200  Protein:  100-115 grams  Fluid:  > 2 L  Larry Beeck, MS, RD, LDN RD pager number/after hours weekend pager number on Amion.

## 2020-05-23 NOTE — Progress Notes (Addendum)
PROGRESS NOTE    [name]  KVQ:[id number] DOB: 11-28-1940 DOA: 05/10/2020 PCP: [name], MD  Brief Narrative - 79 y.o.malewith medical history significant ofBPH, venous insufficiency, CKD, Parkinson's dementia, hypertension, falls, neutropenia, thrombocytopenia, recent admission(from 11/10-11/16 due to altered mental status, findings of old CVA, syncope, incidental Covid infection)who presents from his rehab facility with altered mental status, appears to be having fluctuating mental status, neurology work-up this admission is as significant including MRI, neurology were consulted, and they reported this is expected fluctuation if his progressive Parkinson disease and dementia, and recommendation is for him to continue his Parkinson meds, so core track tube was inserted, mentation has significantly improved, initially obtunded and unresponsive, currently communicative, but remains unsafe to swallow, on 11/20 2 AM patient was febrile, chest x-ray significant for pneumonia, bacterial, aspiration for which she is started on antibiotics.  Assessment & Plan:   #1.  Acute encephalopathy in the setting of Parkinson's disease and dementia.  Patient was recently admitted with similar complaints had extensive work-up including MRI of the brain showing an old infarct, echo, EEG, carotid ultrasound were all nonacute.  A1c was normal & LDL was 90.  He was started on statin and aspirin.  This admission CT of the head stable UA normal.  Repeat MRI, short and long-term EEG nonacute.  He was seen by neurology.  He has a fluctuating mental status given it is natural progression of his illness.  Parkinson's medications are being continued through the core track tube.    Patient showed improvement in mental status last admission was sent to SNF but soon thereafter became confused again was sent back to the hospital.  This admission he was treated for aspiration pneumonia, he patient is being supported  by core track feeding.  His mentation continues to wax and wane.  His wife informs that his mentation changes were somewhat sudden and that he was highly functional up until 05/02/2020 a day before previous hospitalization he was leading a normal life and was quite active.  After this we have repeated MRI brain and long-term EEG both are nonacute, we have discontinued his cefepime and Exelon patch after which mentation has improved mildly.  Overall still high risk for aspiration, have kept core track tube in bili family decides on final goal of care and future line of treatment.  Will involve palliative care.  Have challenged him with oral diet will add calorie counting, speech is following and is for now cleared for dysphagia 1 diet.  Neurology will evaluate the patient later on 05/23/2020 and give their final opinion.  Would also involve palliative care in the meantime for goals of care.  To me it appears patient has gradually progressive mental status decline, remains very high risk for aspiration, in my opinion he is a poor candidate for PEG tube placement as he eventually will have difficulty controlling even his oral secretions.   #2. Aspiration pneumonia this admission.  Finished treatment with cefepime and Flagyl on 05/21/2020.  #3 history of stroke continue aspirin and statin via NG tube.  #4 history of CKD stage III with AKI in the setting of decreased p.o. intake.  Improved  with IV fluids.  His baseline creatinine is 1.5.  Creatinine is 1.14  #5. Parkinson's disease with dementia followed by Dr. Karel Jarvis  #6. Hypokalemia/hypophosphatemia- repleted and resolved.  #7. Bilateral upper extremity edema Doppler shows no evidence of DVT.  #8. COVID-19 infection patient received monoclonal antibody 05/04/2020.  He also finished 10  days of isolation.  #9 FEN patient still has core track tube in place.  He was started on dysphagia 1 diet 05/18/2020, will hold tube feeds and challenge oral diet on  05/22/2020 onwards, gentle IV fluids .  Once neurological evaluation is done we will readdress if he requires PEG tube.  Have discussed with neurology they will provide their opinion on 05/23/2020 later in the day.    Nutrition Problem: Inadequate oral intake Etiology: lethargy/confusion, inability to eat  Signs/Symptoms: NPO status  Interventions: Tube feeding, Prostat  Estimated body mass index is 28.49 kg/m as calculated from the following:   Height as of this encounter: 5\' 8"  (1.727 m).   Weight as of this encounter: 85 kg.  DVT prophylaxis: Lovenox  Code Status: Full code Family Communication:   Discussed with patient's wife bedside & daughter at [id number] - 05/21/20  Called wife on 05/23/2020 & DW daughter 05/23/20 - leaning towards comfort if no further progress, NO PEG.   Disposition Plan:  Status is: Inpatient   Dispo: The patient is from: SNF              Anticipated d/c is to: SNF              Anticipated d/c date is: 2 to 3 days              Patient currently is not medically stable to d/c.   Consultants:  Neurology saw her on 05/12/2020 Procedures: None Antimicrobials:  Anti-infectives (From admission, onward)   Start     Dose/Rate Route Frequency Ordered Stop   05/15/20 0815  ceFEPIme (MAXIPIME) 2 g in sodium chloride 0.9 % 100 mL IVPB  Status:  Discontinued        2 g 200 mL/hr over 30 Minutes Intravenous Every 12 hours 05/15/20 0725 05/21/20 1036   05/15/20 0800  metroNIDAZOLE (FLAGYL) 50 mg/ml oral suspension 500 mg  Status:  Discontinued        500 mg Per Tube 3 times daily 05/15/20 0708 05/21/20 1036       Subjective:  Patient in bed slightly more awake than before, denies any headache chest or abdominal pain, prefers to keep his eyes closed.   Objective:  Vitals:   05/22/20 2019 05/23/20 0400 05/23/20 0433 05/23/20 0754  BP: (!) 171/75  136/65 (!) 127/59  Pulse: 69  77 64  Resp: 16  20 18   Temp: 98.1 F (36.7 C)  (!) 97.5 F (36.4 C)  98.6 F (37 C)  TempSrc: Oral  Axillary Axillary  SpO2: 99%  100% 99%  Weight: 85.5 kg 85 kg    Height:        Intake/Output Summary (Last 24 hours) at 05/23/2020 0959 Last data filed at 05/23/2020 [id number] Gross per 24 hour  Intake 1258 ml  Output 2975 ml  Net -1717 ml   Filed Weights   05/21/20 0700 05/22/20 2019 05/23/20 0400  Weight: 86.4 kg 85.5 kg 85 kg    Examination:     Awake less somnolent today and will open his eyes upon being prompted denies any headache or chest pain, will move it up upper extremities a little bit but not reliably following commands [place].AT,PERRAL Supple Neck,No JVD, No cervical lymphadenopathy appriciated.  Symmetrical Chest wall movement, Good air movement bilaterally, CTAB RRR,No Gallops, Rubs or new Murmurs, No Parasternal Heave +ve B.Sounds, Abd Soft, No tenderness, No organomegaly appriciated, No rebound - guarding or rigidity. No Cyanosis, 1+ edema  Data Reviewed: I have personally reviewed following labs and imaging studies  CBC: Recent Labs  Lab 05/17/20 0416 05/19/20 0939 05/22/20 0215 05/23/20 0645  WBC 7.8 6.1 5.9 5.7  NEUTROABS  --   --  3.0 3.6  HGB 9.6* 9.3* 9.0* 9.4*  HCT 28.2* 27.7* 26.8* 27.7*  MCV 88.4 90.2 90.2 87.4  PLT 298 267 245 287   Basic Metabolic Panel: Recent Labs  Lab 05/17/20 0416 05/19/20 0939 05/22/20 0215 05/23/20 0645  NA 138 138 135 134*  K 4.4 4.7 3.8 3.6  CL 108 109 103 100  CO2 23 23 26 27   GLUCOSE 137* 114* 98 144*  BUN 29* 36* 38* 36*  CREATININE 1.24 1.17 1.24 1.09  CALCIUM 8.4* 8.3* 8.2* 8.3*  MG  --   --  1.9 2.0   GFR: Estimated Creatinine Clearance: 57.3 mL/min (by C-G formula based on SCr of 1.09 mg/dL). Liver Function Tests: Recent Labs  Lab 05/19/20 0939 05/22/20 0215 05/23/20 0645  AST 12* 21 25  ALT 5 8 19   ALKPHOS 42 36* 48  BILITOT 0.5 0.4 0.5  PROT 4.6* 4.6* 5.2*  ALBUMIN 1.9* 1.9* 2.2*   No results for input(s): LIPASE, AMYLASE in the last 168  hours. No results for input(s): AMMONIA in the last 168 hours. Coagulation Profile: No results for input(s): INR, PROTIME in the last 168 hours. Cardiac Enzymes: No results for input(s): CKTOTAL, CKMB, CKMBINDEX, TROPONINI in the last 168 hours. BNP (last 3 results) No results for input(s): PROBNP in the last 8760 hours. HbA1C: No results for input(s): HGBA1C in the last 72 hours. CBG: Recent Labs  Lab 05/22/20 0051 05/22/20 0311 05/22/20 0750 05/22/20 1137 05/22/20 1702  GLUCAP 84 89 96 89 79   Lipid Profile: No results for input(s): CHOL, HDL, LDLCALC, TRIG, CHOLHDL, LDLDIRECT in the last 72 hours. Thyroid Function Tests: Recent Labs    05/21/20 1116  TSH 4.788*   Anemia Panel: No results for input(s): VITAMINB12, FOLATE, FERRITIN, TIBC, IRON, RETICCTPCT in the last 72 hours. Sepsis Labs: Recent Labs  Lab 05/17/20 0416 05/22/20 0215 05/23/20 0645  PROCALCITON 0.28 <0.10 <0.10    Recent Results (from the past 240 hour(s))  Culture, blood (routine x 2)     Status: None   Collection Time: 05/15/20  9:13 AM   Specimen: BLOOD  Result Value Ref Range Status   Specimen Description BLOOD LEFT ANTECUBITAL  Final   Special Requests   Final    BOTTLES DRAWN AEROBIC AND ANAEROBIC Blood Culture results may not be optimal due to an inadequate volume of blood received in culture bottles   Culture   Final    NO GROWTH 5 DAYS Performed at [facility], [address], [place], [address] [place]    Report Status 05/20/2020 FINAL  Final  Culture, blood (routine x 2)     Status: None   Collection Time: 05/15/20  9:13 AM   Specimen: BLOOD  Result Value Ref Range Status   Specimen Description BLOOD BLOOD LEFT HAND  Final   Special Requests   Final    BOTTLES DRAWN AEROBIC AND ANAEROBIC Blood Culture results may not be optimal due to an inadequate volume of blood received in culture bottles   Culture   Final    NO GROWTH 5 DAYS Performed at [facility], [address], [place], [address] [place]    Report Status 05/20/2020 FINAL  Final  Radiology Studies: MR BRAIN WO CONTRAST  Result Date: 05/21/2020 CLINICAL DATA:  Delirium. EXAM: MRI HEAD WITHOUT CONTRAST TECHNIQUE: Multiplanar, multiecho pulse sequences of the brain and surrounding structures were obtained without intravenous contrast. COMPARISON:  05/11/2020 FINDINGS: Brain: There is no evidence of an acute infarct, mass, midline shift, or extra-axial fluid collection. There may be a single chronic microhemorrhage in the right cerebellum. Patchy and early confluent T2 hyperintensities in the cerebral white matter are unchanged and nonspecific but compatible with moderate chronic small vessel ischemic disease. Mild chronic small vessel changes are present in the pons. Ventricular enlargement is unchanged and favored to be secondary to cerebral atrophy rather than hydrocephalus with note again made of severe bilateral mesial temporal lobe atrophy. Vascular: Major intracranial vascular flow voids are preserved. Skull and upper cervical spine: Unremarkable bone marrow signal. Sinuses/Orbits: Right cataract extraction. Mild mucosal thickening in the paranasal sinuses. Trace right mastoid effusion. Other: None. IMPRESSION: 1. No acute intracranial abnormality. 2. Moderate chronic small vessel ischemic disease and temporal lobe predominant cerebral atrophy. Electronically Signed   By: Sebastian Ache M.D.   On: 05/21/2020 18:03   EEG adult  Result Date: 05/22/2020 Charlsie Quest, MD     05/22/2020 12:41 PM Patient Name: [name] MRN: [id number] Epilepsy Attending: Charlsie Quest Referring Physician/Provider: Dr. Susa Raring Date: 05/22/2020 Duration: 23.52 minutes Patient history: 79 year old male with persistent encephalopathy in the setting of Parkinson's disease and dementia.  EEG to evaluate for seizures. Level of alertness: Awake AEDs during EEG study: None Technical aspects: This EEG  study was done with scalp electrodes positioned according to the 10-20 International system of electrode placement. Electrical activity was acquired at a sampling rate of  and reviewed with a high frequency filter of  and a low frequency filter of . EEG data were recorded continuously and digitally stored. Description: The posterior dominant rhythm consists of  activity of moderate voltage (25-35 uV) seen predominantly in posterior head regions, symmetric and reactive to eye opening and eye closing. EEG showed continuous generalized 3 to 6 Hz theta-delta slowing. Hyperventilation and photic stimulation were not performed.   ABNORMALITY - Continuous slow, generalized IMPRESSION: This study is suggestive of mild diffuse encephalopathy, nonspecific etiology. No seizures or epileptiform discharges were seen throughout the recording. Priyanka Annabelle Harman   Overnight EEG with video  Result Date: 05/23/2020 Charlsie Quest, MD     05/23/2020  8:47 AM Patient Name: [name] MRN: [id number] Epilepsy Attending: Charlsie Quest Referring Physician/Provider: Dr. Onalee Hua Duration:  05/22/1020 1017 to 05/23/2020 0845  Patient history: 79 year old male with persistent encephalopathy in the setting of Parkinson's disease and dementia.  EEG to evaluate for seizures.  Level of alertness: Awake, asleep  AEDs during EEG study: None  Technical aspects: This EEG study was done with scalp electrodes positioned according to the 10-20 International system of electrode placement. Electrical activity was acquired at a sampling rate of  and reviewed with a high frequency filter of  and a low frequency filter of . EEG data were recorded continuously and digitally stored.  Description: The posterior dominant rhythm consists of  activity of moderate voltage (25-35 uV) seen predominantly in posterior head regions, symmetric and reactive to eye opening and eye closing.  Sleep was characterized  by vertex is currently 10 (12-14 at), maximal frontocentral region.  EEG showed continuous generalized 3 to 6 Hz theta-delta slowing. Hyperventilation and photic stimulation were not performed.   EEG showed electrode artifact  after 2230 which made parts of study difficult to interpret.  ABNORMALITY - Continuous slow, generalized  IMPRESSION: This study is suggestive of mild diffuse encephalopathy, nonspecific etiology. No seizures or epileptiform discharges were seen throughout the recording.  Priyanka Annabelle Harman    Scheduled Meds: . aspirin  81 mg Per Tube Daily  . carbidopa-levodopa  1 tablet Per Tube TID WC  . carvedilol  6.25 mg Per Tube BID WC  . cholecalciferol  1,000 Units Per Tube Daily  . enoxaparin (LOVENOX) injection  40 mg Subcutaneous Daily  . feeding supplement  237 mL Oral BID BM  . feeding supplement (OSMOLITE 1.5 CAL)  960 mL Per Tube Q24H  . feeding supplement (PROSource TF)  90 mL Per Tube BID  . free water  155 mL Per Tube Q4H  . sodium chloride flush  3 mL Intravenous Q12H  . vitamin B-12  1,000 mcg Per Tube Daily   Continuous Infusions: . lactated ringers 35 mL/hr at 05/22/20 1100     LOS: 13 days     Susa Raring, MD  05/23/2020, 9:59 AM

## 2020-05-24 DIAGNOSIS — F0280 Dementia in other diseases classified elsewhere without behavioral disturbance: Secondary | ICD-10-CM | POA: Diagnosis not present

## 2020-05-24 DIAGNOSIS — G20 Parkinson's disease: Secondary | ICD-10-CM | POA: Diagnosis not present

## 2020-05-24 LAB — CBC WITH DIFFERENTIAL/PLATELET
Abs Immature Granulocytes: 0.04 10*3/uL (ref 0.00–0.07)
Basophils Absolute: 0.1 10*3/uL (ref 0.0–0.1)
Basophils Relative: 1 %
Eosinophils Absolute: 0.1 10*3/uL (ref 0.0–0.5)
Eosinophils Relative: 1 %
HCT: 25.5 % — ABNORMAL LOW (ref 39.0–52.0)
Hemoglobin: 8.6 g/dL — ABNORMAL LOW (ref 13.0–17.0)
Immature Granulocytes: 1 %
Lymphocytes Relative: 40 %
Lymphs Abs: 1.8 10*3/uL (ref 0.7–4.0)
MCH: 30.2 pg (ref 26.0–34.0)
MCHC: 33.7 g/dL (ref 30.0–36.0)
MCV: 89.5 fL (ref 80.0–100.0)
Monocytes Absolute: 0.5 10*3/uL (ref 0.1–1.0)
Monocytes Relative: 11 %
Neutro Abs: 2.1 10*3/uL (ref 1.7–7.7)
Neutrophils Relative %: 46 %
Platelets: 239 10*3/uL (ref 150–400)
RBC: 2.85 MIL/uL — ABNORMAL LOW (ref 4.22–5.81)
RDW: 12.7 % (ref 11.5–15.5)
WBC: 4.5 10*3/uL (ref 4.0–10.5)
nRBC: 0 % (ref 0.0–0.2)

## 2020-05-24 LAB — COMPREHENSIVE METABOLIC PANEL
ALT: 12 U/L (ref 0–44)
AST: 17 U/L (ref 15–41)
Albumin: 2.1 g/dL — ABNORMAL LOW (ref 3.5–5.0)
Alkaline Phosphatase: 39 U/L (ref 38–126)
Anion gap: 7 (ref 5–15)
BUN: 37 mg/dL — ABNORMAL HIGH (ref 8–23)
CO2: 28 mmol/L (ref 22–32)
Calcium: 8.3 mg/dL — ABNORMAL LOW (ref 8.9–10.3)
Chloride: 103 mmol/L (ref 98–111)
Creatinine, Ser: 1.15 mg/dL (ref 0.61–1.24)
GFR, Estimated: >60 mL/min (ref >60)
Glucose, Bld: 146 mg/dL — ABNORMAL HIGH (ref 70–99)
Potassium: 3.6 mmol/L (ref 3.5–5.1)
Sodium: 138 mmol/L (ref 135–145)
Total Bilirubin: 0.5 mg/dL (ref 0.3–1.2)
Total Protein: 4.7 g/dL — ABNORMAL LOW (ref 6.5–8.1)

## 2020-05-24 LAB — GLUCOSE, CAPILLARY
Glucose-Capillary: 136 mg/dL — ABNORMAL HIGH (ref 70–99)
Glucose-Capillary: 103 mg/dL — ABNORMAL HIGH (ref 70–99)
Glucose-Capillary: 104 mg/dL — ABNORMAL HIGH (ref 70–99)
Glucose-Capillary: 109 mg/dL — ABNORMAL HIGH (ref 70–99)
Glucose-Capillary: 118 mg/dL — ABNORMAL HIGH (ref 70–99)
Glucose-Capillary: 139 mg/dL — ABNORMAL HIGH (ref 70–99)
Glucose-Capillary: 80 mg/dL (ref 70–99)
Glucose-Capillary: 99 mg/dL (ref 70–99)
Glucose-Capillary: 100 mg/dL — ABNORMAL HIGH (ref 70–99)

## 2020-05-24 LAB — C-REACTIVE PROTEIN: CRP: 0.7 mg/dL (ref <1.0)

## 2020-05-24 LAB — PROCALCITONIN: Procalcitonin: <0.1 ng/mL

## 2020-05-24 LAB — MAGNESIUM: Magnesium: 1.9 mg/dL (ref 1.7–2.4)

## 2020-05-24 LAB — D-DIMER, QUANTITATIVE: D-Dimer, Quant: 2.15 ug/mL-FEU — ABNORMAL HIGH (ref 0.00–0.50)

## 2020-05-24 LAB — BRAIN NATRIURETIC PEPTIDE: B Natriuretic Peptide: 136.4 pg/mL — ABNORMAL HIGH (ref 0.0–100.0)

## 2020-05-24 NOTE — Progress Notes (Signed)
Patient ID: [name], male   DOB: [birth year], 79 y.o.   MRN: [id number]       Thank you for this consult. Chart reviewed. Discussed with Dr. Randol Kern.  Spoke with wife Clide Cliff) and daughter Nonda Lou) via phone in efforts to arrange family meeting. Plan for GOC meeting tomorrow at 11:00am.   Sherlean Foot, NP-C Palliative Medicine   Please call Palliative Medicine team phone with any questions [phone]. For individual providers please see AMION.   No charge

## 2020-05-24 NOTE — Progress Notes (Signed)
PROGRESS NOTE    [name]  RCV:[id number] DOB: 1941/06/03 DOA: 05/10/2020 PCP: [name], MD   Brief Narrative  - 79 y.o.malewith medical history significant ofBPH, venous insufficiency, CKD, Parkinson's dementia, hypertension, falls, neutropenia, thrombocytopenia, recent admission(from 11/10-11/16 due to altered mental status, findings of old CVA, syncope, incidental Covid infection)who presents from his rehab facility with altered mental status, appears to be having fluctuating mental status, neurology work-up this admission is as significant including MRI, neurology were consulted, and they reported this is expected fluctuation if his progressive Parkinson disease and dementia, and recommendation is for him to continue his Parkinson meds, so core track tube was inserted, mentation has significantly improved, initially obtunded and unresponsive, currently communicative, but remains unsafe to swallow, on 11/20 2 AM patient was febrile, chest x-ray significant for pneumonia, bacterial, aspiration for which she is started on antibiotics.  Subjective:  No significant events overnight as discussed with staff, patient himself denies any complaints today, .   Assessment & Plan:   Acute encephalopathy in the setting of Parkinson's disease and dementia.   -Patient was recently admitted with similar complaints had extensive work-up including MRI of the brain showing an old infarct, echo, EEG, carotid ultrasound were all nonacute.  A1c was normal & LDL was 90.  He was started on statin and aspirin.  This admission CT of the head stable UA normal.  Repeat MRI, short and long-term EEG nonacute.  -Neurology input greatly appreciated, mental status changes and fluctuations appear to be natural progression of his illness related to Parkinson disease and dementia, no improvement of mentation despite being getting his Parkinson medicine regularly through his NG tube, discussed at length with  wife by phone, and daughter at bedside today, informs me he never wanted feeding tube in the past, palliative medicine consulted regarding goals of care, as it is anticipated for him to continue decline given the progressive nature of his disease to address goals of care, family is leaning towards hospice, they will have meeting with palliative medicine tomorrow .  After this we have repeated MRI brain and long-term EEG both are nonacute, we have discontinued his cefepime and Exelon patch after which mentation has improved mildly.  Overall still high risk for aspiration, have kept core track tube in bili family decides on final goal of care and future line of treatment.  Will involve palliative care.  Have challenged him with oral diet will add calorie counting, speech is following and is for now cleared for dysphagia 1 diet.  Neurology will evaluate the patient later on 05/23/2020 and give their final opinion.  Would also involve palliative care in the meantime for goals of care.  To me it appears patient has gradually progressive mental status decline, remains very high risk for aspiration, in my opinion he is a poor candidate for PEG tube placement as he eventually will have difficulty controlling even his oral secretions.   Aspiration pneumonia this admission.  Finished treatment with cefepime and Flagyl on 05/21/2020.  history of stroke continue aspirin and statin via NG tube.  history of CKD stage III with AKI in the setting of decreased p.o. intake.  Improved  with IV fluids.  His baseline creatinine is 1.5.  Creatinine is 1.14  Parkinson's disease with dementia followed by Dr. Karel Jarvis  Hypokalemia/hypophosphatemia- repleted and resolved.   Bilateral upper extremity edema Doppler shows no evidence of DVT.  COVID-19 infection patient received monoclonal antibody 05/04/2020.  He also finished 10 days of isolation.  FEN patient still has core track tube in place.  He was started on dysphagia  1 diet 05/18/2020, will hold tube feeds and challenge oral diet on 05/22/2020 onwards, gentle IV fluids .  Once neurological evaluation is done we will readdress if he requires PEG tube.  Have discussed with neurology they will provide their opinion on 05/23/2020 later in the day.    Nutrition Problem: Inadequate oral intake Etiology: lethargy/confusion, inability to eat  Signs/Symptoms: NPO status  Interventions: Tube feeding, Prostat  Estimated body mass index is 28.49 kg/m as calculated from the following:   Height as of this encounter: 5\' 8"  (1.727 m).   Weight as of this encounter: 85 kg.  DVT prophylaxis: Lovenox  Code Status: Full code Family Communication:   Discussed with patient's wife by phone, and daughter at bedside   Disposition Plan:  Status is: Inpatient   Dispo: The patient is from: SNF              Anticipated d/c is to: Hospcie              Anticipated d/c date is: 2 to 3 days              Patient currently is not medically stable to d/c.   Consultants:  Neurology saw her on 05/12/2020 Procedures: None Antimicrobials:  Anti-infectives (From admission, onward)   Start     Dose/Rate Route Frequency Ordered Stop   05/15/20 0815  ceFEPIme (MAXIPIME) 2 g in sodium chloride 0.9 % 100 mL IVPB  Status:  Discontinued        2 g 200 mL/hr over 30 Minutes Intravenous Every 12 hours 05/15/20 0725 05/21/20 1036   05/15/20 0800  metroNIDAZOLE (FLAGYL) 50 mg/ml oral suspension 500 mg  Status:  Discontinued        500 mg Per Tube 3 times daily 05/15/20 0708 05/21/20 1036         Objective:  Vitals:   05/23/20 2006 05/23/20 2107 05/24/20 0514 05/24/20 1333  BP: (!) 171/71 (!) 117/41 (!) 111/53 (!) 151/74  Pulse:   67 65  Resp:   15 18  Temp:   98 F (36.7 C) 98.1 F (36.7 C)  TempSrc:   Axillary Axillary  SpO2:   94% 91%  Weight:      Height:        Intake/Output Summary (Last 24 hours) at 05/24/2020 1613 Last data filed at 05/24/2020 1500 Gross per 24  hour  Intake 943 ml  Output 1351 ml  Net -408 ml   Filed Weights   05/21/20 0700 05/22/20 2019 05/23/20 0400  Weight: 86.4 kg 85.5 kg 85 kg    Examination:     Wakes up to verbal stimuli, answering some questions, but overall remains confused, keeping his eyes closed during entire interaction . Fair air entry bilaterally with no wheezing  Regular rate and rhythm, no rubs or gallops  +ve B.Sounds, Abd Soft, No tenderness, No organomegaly appriciated, No rebound - guarding or rigidity. No Cyanosis, 1+ edema      Data Reviewed: I have personally reviewed following labs and imaging studies  CBC: Recent Labs  Lab 05/19/20 0939 05/22/20 0215 05/23/20 0645 05/24/20 0439  WBC 6.1 5.9 5.7 4.5  NEUTROABS  --  3.0 3.6 2.1  HGB 9.3* 9.0* 9.4* 8.6*  HCT 27.7* 26.8* 27.7* 25.5*  MCV 90.2 90.2 87.4 89.5  PLT 267 245 287 239   Basic Metabolic Panel: Recent Labs  Lab 05/19/20  6948 05/22/20 0215 05/23/20 0645 05/24/20 0439  NA 138 135 134* 138  K 4.7 3.8 3.6 3.6  CL 109 103 100 103  CO2 23 26 27 28   GLUCOSE 114* 98 144* 146*  BUN 36* 38* 36* 37*  CREATININE 1.17 1.24 1.09 1.15  CALCIUM 8.3* 8.2* 8.3* 8.3*  MG  --  1.9 2.0 1.9   GFR: Estimated Creatinine Clearance: 54.3 mL/min (by C-G formula based on SCr of 1.15 mg/dL). Liver Function Tests: Recent Labs  Lab 05/19/20 0939 05/22/20 0215 05/23/20 0645 05/24/20 0439  AST 12* 21 25 17   ALT 5 8 19 12   ALKPHOS 42 36* 48 39  BILITOT 0.5 0.4 0.5 0.5  PROT 4.6* 4.6* 5.2* 4.7*  ALBUMIN 1.9* 1.9* 2.2* 2.1*   No results for input(s): LIPASE, AMYLASE in the last 168 hours. No results for input(s): AMMONIA in the last 168 hours. Coagulation Profile: No results for input(s): INR, PROTIME in the last 168 hours. Cardiac Enzymes: No results for input(s): CKTOTAL, CKMB, CKMBINDEX, TROPONINI in the last 168 hours. BNP (last 3 results) No results for input(s): PROBNP in the last 8760 hours. HbA1C: No results for input(s):  HGBA1C in the last 72 hours. CBG: Recent Labs  Lab 05/23/20 2356 05/24/20 0428 05/24/20 0726 05/24/20 1152 05/24/20 1559  GLUCAP 113* 136* 103* 104* 99   Lipid Profile: No results for input(s): CHOL, HDL, LDLCALC, TRIG, CHOLHDL, LDLDIRECT in the last 72 hours. Thyroid Function Tests: No results for input(s): TSH, T4TOTAL, FREET4, T3FREE, THYROIDAB in the last 72 hours. Anemia Panel: No results for input(s): VITAMINB12, FOLATE, FERRITIN, TIBC, IRON, RETICCTPCT in the last 72 hours. Sepsis Labs: Recent Labs  Lab 05/22/20 0215 05/23/20 0645 05/24/20 0439  PROCALCITON <0.10 <0.10 <0.10    Recent Results (from the past 240 hour(s))  Culture, blood (routine x 2)     Status: None   Collection Time: 05/15/20  9:13 AM   Specimen: BLOOD  Result Value Ref Range Status   Specimen Description BLOOD LEFT ANTECUBITAL  Final   Special Requests   Final    BOTTLES DRAWN AEROBIC AND ANAEROBIC Blood Culture results may not be optimal due to an inadequate volume of blood received in culture bottles   Culture   Final    NO GROWTH 5 DAYS Performed at [facility], [address], [place], [facility] [address]    Report Status 05/20/2020 FINAL  Final  Culture, blood (routine x 2)     Status: None   Collection Time: 05/15/20  9:13 AM   Specimen: BLOOD  Result Value Ref Range Status   Specimen Description BLOOD BLOOD LEFT HAND  Final   Special Requests   Final    BOTTLES DRAWN AEROBIC AND ANAEROBIC Blood Culture results may not be optimal due to an inadequate volume of blood received in culture bottles   Culture   Final    NO GROWTH 5 DAYS Performed at [facility], [address] [place], [facility] [address]    Report Status 05/20/2020 FINAL  Final         Radiology Studies: Overnight EEG with video  Result Date: 05/23/2020 03500, MD     05/23/2020  4:11 PM Patient Name: OLUWADAMILOLA ROSAMOND MRN: [facility] Attending: 05/25/2020 Referring  Physician/Provider: Dr. [name] Duration:  05/22/1020 1017 to 05/23/2020 1017  Patient history: 79 year old male with persistent encephalopathy in the setting of Parkinson's disease and dementia.  EEG to evaluate  for seizures.  Level of alertness: Awake, asleep  AEDs during EEG study: None  Technical aspects: This EEG study was done with scalp electrodes positioned according to the 10-20 International system of electrode placement. Electrical activity was acquired at a sampling rate of 500Hz  and reviewed with a high frequency filter of 70Hz  and a low frequency filter of 1Hz . EEG data were recorded continuously and digitally stored.  Description: The posterior dominant rhythm consists of 8Hz  activity of moderate voltage (25-35 uV) seen predominantly in posterior head regions, symmetric and reactive to eye opening and eye closing.  Sleep was characterized by vertex is currently 10 (12-14 at), maximal frontocentral region.  EEG showed continuous generalized 3 to 6 Hz theta-delta slowing. Hyperventilation and photic stimulation were not performed.   EEG showed electrode artifact after 2230 which made parts of study difficult to interpret.  ABNORMALITY - Continuous slow, generalized  IMPRESSION: This study is suggestive of mild diffuse encephalopathy, nonspecific etiology. No seizures or epileptiform discharges were seen throughout the recording.  Priyanka Annabelle Harman Yadav    Scheduled Meds: . aspirin  81 mg Per Tube Daily  . carbidopa-levodopa  1 tablet Per Tube TID WC  . carvedilol  6.25 mg Per Tube BID WC  . cholecalciferol  1,000 Units Per Tube Daily  . enoxaparin (LOVENOX) injection  40 mg Subcutaneous Daily  . feeding supplement  237 mL Oral BID BM  . feeding supplement (OSMOLITE 1.5 CAL)  960 mL Per Tube Q24H  . feeding supplement (PROSource TF)  90 mL Per Tube BID  . free water  155 mL Per Tube Q4H  . sodium chloride flush  3 mL Intravenous Q12H  . vitamin B-12  1,000 mcg Per Tube Daily    Continuous Infusions:    LOS: 14 days     Huey Bienenstockawood Zeeva Courser, MD  05/24/2020, 4:13 PM

## 2020-05-24 NOTE — Plan of Care (Signed)
  Problem: Clinical Measurements: Goal: Ability to maintain clinical measurements within normal limits will improve Outcome: Progressing Goal: Will remain free from infection Outcome: Progressing Goal: Diagnostic test results will improve Outcome: Progressing Goal: Cardiovascular complication will be avoided Outcome: Progressing   Problem: Nutrition: Goal: Adequate nutrition will be maintained Outcome: Progressing   Problem: Coping: Goal: Level of anxiety will decrease Outcome: Progressing   Problem: Skin Integrity: Goal: Risk for impaired skin integrity will decrease Outcome: Progressing   Problem: Education: Goal: Knowledge of General Education information will improve Description: Including pain rating scale, medication(s)/side effects and non-pharmacologic comfort measures Outcome: Not Progressing   Problem: Health Behavior/Discharge Planning: Goal: Ability to manage health-related needs will improve Outcome: Not Progressing   Problem: Activity: Goal: Risk for activity intolerance will decrease Outcome: Not Progressing   

## 2020-05-25 DIAGNOSIS — Z515 Encounter for palliative care: Secondary | ICD-10-CM

## 2020-05-25 DIAGNOSIS — R1310 Dysphagia, unspecified: Secondary | ICD-10-CM

## 2020-05-25 DIAGNOSIS — Z7189 Other specified counseling: Secondary | ICD-10-CM

## 2020-05-25 LAB — CBC WITH DIFFERENTIAL/PLATELET
Abs Immature Granulocytes: 0.05 10*3/uL (ref 0.00–0.07)
Basophils Absolute: 0.1 10*3/uL (ref 0.0–0.1)
Basophils Relative: 1 %
Eosinophils Absolute: 0.1 10*3/uL (ref 0.0–0.5)
Eosinophils Relative: 2 %
HCT: 26.8 % — ABNORMAL LOW (ref 39.0–52.0)
Hemoglobin: 9.3 g/dL — ABNORMAL LOW (ref 13.0–17.0)
Immature Granulocytes: 1 %
Lymphocytes Relative: 35 %
Lymphs Abs: 1.8 10*3/uL (ref 0.7–4.0)
MCH: 30.8 pg (ref 26.0–34.0)
MCHC: 34.7 g/dL (ref 30.0–36.0)
MCV: 88.7 fL (ref 80.0–100.0)
Monocytes Absolute: 0.5 10*3/uL (ref 0.1–1.0)
Monocytes Relative: 10 %
Neutro Abs: 2.6 10*3/uL (ref 1.7–7.7)
Neutrophils Relative %: 51 %
Platelets: 269 10*3/uL (ref 150–400)
RBC: 3.02 MIL/uL — ABNORMAL LOW (ref 4.22–5.81)
RDW: 13 % (ref 11.5–15.5)
WBC: 5.2 10*3/uL (ref 4.0–10.5)
nRBC: 0 % (ref 0.0–0.2)

## 2020-05-25 LAB — COMPREHENSIVE METABOLIC PANEL
ALT: 15 U/L (ref 0–44)
AST: 16 U/L (ref 15–41)
Albumin: 2.3 g/dL — ABNORMAL LOW (ref 3.5–5.0)
Alkaline Phosphatase: 53 U/L (ref 38–126)
Anion gap: 7 (ref 5–15)
BUN: 41 mg/dL — ABNORMAL HIGH (ref 8–23)
CO2: 26 mmol/L (ref 22–32)
Calcium: 8.5 mg/dL — ABNORMAL LOW (ref 8.9–10.3)
Chloride: 104 mmol/L (ref 98–111)
Creatinine, Ser: 1.15 mg/dL (ref 0.61–1.24)
GFR, Estimated: >60 mL/min (ref >60)
Glucose, Bld: 125 mg/dL — ABNORMAL HIGH (ref 70–99)
Potassium: 3.6 mmol/L (ref 3.5–5.1)
Sodium: 137 mmol/L (ref 135–145)
Total Bilirubin: 0.3 mg/dL (ref 0.3–1.2)
Total Protein: 5 g/dL — ABNORMAL LOW (ref 6.5–8.1)

## 2020-05-25 LAB — BRAIN NATRIURETIC PEPTIDE: B Natriuretic Peptide: 112.4 pg/mL — ABNORMAL HIGH (ref 0.0–100.0)

## 2020-05-25 LAB — PROCALCITONIN: Procalcitonin: <0.1 ng/mL

## 2020-05-25 LAB — C-REACTIVE PROTEIN: CRP: 0.5 mg/dL (ref <1.0)

## 2020-05-25 LAB — GLUCOSE, CAPILLARY
Glucose-Capillary: 103 mg/dL — ABNORMAL HIGH (ref 70–99)
Glucose-Capillary: 106 mg/dL — ABNORMAL HIGH (ref 70–99)
Glucose-Capillary: 111 mg/dL — ABNORMAL HIGH (ref 70–99)
Glucose-Capillary: 112 mg/dL — ABNORMAL HIGH (ref 70–99)
Glucose-Capillary: 125 mg/dL — ABNORMAL HIGH (ref 70–99)
Glucose-Capillary: 174 mg/dL — ABNORMAL HIGH (ref 70–99)

## 2020-05-25 LAB — MAGNESIUM: Magnesium: 1.9 mg/dL (ref 1.7–2.4)

## 2020-05-25 LAB — D-DIMER, QUANTITATIVE: D-Dimer, Quant: 2.24 ug/mL-FEU — ABNORMAL HIGH (ref 0.00–0.50)

## 2020-05-25 MED ORDER — PANTOPRAZOLE SODIUM 40 MG PO PACK
40.0000 mg | PACK | Freq: Two times a day (BID) | ORAL | Status: DC
  Administered 2020-05-25 – 2020-05-30 (×10): 40 mg
  Filled 2020-05-25 (×11): qty 20

## 2020-05-25 MED ORDER — ALUM & MAG HYDROXIDE-SIMETH 200-200-20 MG/5ML PO SUSP
30.0000 mL | Freq: Four times a day (QID) | ORAL | Status: DC | PRN
  Administered 2020-05-25: 30 mL
  Filled 2020-05-25: qty 30

## 2020-05-25 MED ORDER — PANTOPRAZOLE SODIUM 40 MG PO PACK
40.0000 mg | PACK | Freq: Every day | ORAL | Status: DC
  Administered 2020-05-25: 40 mg
  Filled 2020-05-25: qty 20

## 2020-05-25 NOTE — Consult Note (Signed)
Consultation Note Date: 05/25/2020   Patient Name: [name]  DOB: [birth year]  MRN: [id number]  Age / Sex: 79 y.o., male  PCP: Plotnikov, Evie Lacks, MD Referring Physician: Albertine Patricia, MD  Reason for Consultation: Establishing goals of care  HPI/Patient Profile: 79 y.o. male  with past medical history of Parkinson's dementia, hypertension, CKD, BPH, venous insufficiency, falls, and thrombocytopenia. He was recently admitted 11/10-11/16 with AMS and syncope; was found to have an old CVA and incidental Covid infection. He was discharged to SNF for rehab. He then presented to the emergency department on 05/10/2020 with AMS. His mental status has fluctuated throughout the hospitalization. He has had extensive neurology work-up this admission including MRI and EEG, all with non-acute findings. Per neurology, this is expected fluctuation for progressive Parkinson's dementia, and made recommendation for him to continue Parkinson meds so cortrak was placed. On 11/20, patient was febrile and chest x-ray showed aspiration pneumonia.  Patient remains unsafe to swallow.  Palliative medicine has been consulted to assist with goals of care in the setting of complex medical decision making.    Clinical Assessment and Goals of Care:  I have reviewed medical records including EPIC notes, labs and imaging, and examined the patient. I met in the 5th floor conference room  with patient's family  to discuss diagnosis, prognosis, GOC, EOL wishes, disposition, and options.  There were multiple family members present including wife [name]), daughter [name]), son [name]), and others.   I introduced Palliative Medicine as specialized medical care for people living with serious illness. It focuses on providing relief from the symptoms and stress of a serious illness.   We discussed a brief life review of the patient.  Family describes him as a very humorous person and a good provider. He worked for many years at [employer]. 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 He and [name] have been married for 40 years. They have 5 children - [name], and [name].   As far as functional status, patient was ambulatory with a walker prior to 11/10. There had been a gradual decline prior to that, but he was still able to care for himself, make his coffee, enjoy time with family, etc. It has been very difficult for the family to see him decline so quickly.   We discussed his current illness and what it means in the larger context of his ongoing co-morbidities.  Natural disease trajectory of progressive Parkinson's dementia was discussed. This includes decreased ability to communicate, ambulate, swallow, and maintain continence. Family seems to have a clear understanding that his condition is progressive and irreversible.   Discussed at length that Parkinson's dementia causes irreversible dysphagia. Discussed his risk for aspiration and that patient's current PO intake is not enough to sustain him/her long term. Discussed that artificial feeding is a life-prolonging measure, and he is currently being prolonged on nocturnal feedings.  Family  shares with me that patient has clearly stated he does not want a permanent feeding tube.   I introduced the concept of a comfort path to family and encouraged them to think about at what point patient/family would want to stop supportive interventions (artificial feeding) and focus on quality of life and dignity rather than prolonging life. Briefly discussed concept of comfort feedings.  Introduced hospice philosophy and provided information on home vs residential hospice services - answered all questions.   Also discussed disposition option of SNF/rehab.  This does not seem to be a viable option due to his minimal po intake and not wanting to have a PEG. Also discussed PT may have little benefit for him.   Finally discussed the issue of code status. Emphasizing poor outcomes in similar hospitalized patients, as the cause of the arrest is likely associated with chronic/terminal disease rather than a reversible acute cardio-pulmonary event. Encouraged having a DNR order in place, to which family agrees.   Questions and concerns were addressed.  The family was encouraged to call with questions or concerns.    Primary decision maker: [name] (spouse) [phone] with support from her children.     SUMMARY OF RECOMMENDATIONS   - code status changed to DNR/DNI - continue current care for now - family is leaning toward comfort, but struggling with decision to not continue tube feedings - please allow family to switch during the day to provide assistance with hand feeding (1 at a time, but 2 per day) - PMT will continue to follow  Code Status/Advance Care Planning:  DNR  Symptom Management:   Trazodone   Carbidopa-levodopa  Palliative Prophylaxis:   Aspiration, Oral Care and Turn Reposition  Additional Recommendations (Limitations, Scope, Preferences):  No PEG tube  Psycho-social/Spiritual:   Created space and opportunity for patient and family to express thoughts and feelings regarding patient's current medical situation.   Emotional support provided    Prognosis:   Poor overall  Discharge Planning: To Be Determined      Primary Diagnoses: Present on Admission: . Acute encephalopathy . Chronic venous insufficiency . Dementia due to Parkinson's disease without behavioral disturbance ([place]) . Essential hypertension . Parkinson's syndrome ([place])   I have reviewed the medical record, interviewed the patient and family, and examined the patient. The following aspects are pertinent.  Past Medical History:    Diagnosis Date  . B12 deficiency 11/19/2018  . Benign prostatic hypertrophy   . Edema   . Hyperlipidemia   . Hypertension   . Insomnia   . Parasomnia 10/14/2011   PSG 12/15/11>>AHI 0.4, SpO2 low 89%, PLMI 16, somnoliloquay, nocturia   . Prediabetes 06/10/2012  . Seasonal allergies   . Sepsis ([place]) 01/2019   Social History   Socioeconomic History  . Marital status: Married    Spouse name: Not on file  . Number of children: Not on file  . Years of education: Not on file  . Highest education level: Not on file  Occupational History  . Occupation: Retired  Tobacco Use  . Smoking status: Former Smoker    Packs/day: 1.00    Years: 7.00    Pack years: 7.00    Types: Cigarettes, Cigars  . Smokeless tobacco: Never Used  . Tobacco comment: cigars day/ 1-2       QIUIT SMOKING IN 2000  Vaping Use  . Vaping Use: Never used  Substance and Sexual Activity  . Alcohol use: Not Currently    Comment: OCCASSIONAL WINE -1 glass/month  .  Drug use: No  . Sexual activity: Not Currently    Partners: Female  Other Topics Concern  . 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 Difficulty of Paying Living Expenses: Not on file  Food Insecurity:   . Worried About Charity fundraiser in the Last Year: Not on file  . Ran Out of Food in the Last Year: Not on file  Transportation Needs:   . Lack of Transportation (Medical): Not on file  . Lack of Transportation (Non-Medical): Not on file  Physical Activity:   . Days of Exercise per Week: Not on file  . Minutes of Exercise per Session: Not on file  Stress:   . Feeling of Stress : Not on file  Social Connections:   . Frequency of Communication with Friends and Family: Not on file  . Frequency of Social Gatherings with Friends and Family: Not on file  . Attends Religious Services: Not on file  .  Active Member of Clubs or Organizations: Not on file  . Attends Archivist Meetings: Not on file  . Marital Status: Not on file   Family History  Problem Relation Age of Onset  . Diabetes Mother   . Tremor Father   . Diabetes Other    Scheduled Meds: . aspirin  81 mg Per Tube Daily  . carbidopa-levodopa  1 tablet Per Tube TID WC  . carvedilol  6.25 mg Per Tube BID WC  . cholecalciferol  1,000 Units Per Tube Daily  . enoxaparin (LOVENOX) injection  40 mg Subcutaneous Daily  . feeding supplement  237 mL Oral BID BM  . feeding supplement (OSMOLITE 1.5 CAL)  960 mL Per Tube Q24H  . feeding supplement (PROSource TF)  90 mL Per Tube BID  . free water  155 mL Per Tube Q4H  . pantoprazole sodium  40 mg Per Tube Daily  . sodium chloride flush  3 mL Intravenous Q12H  . vitamin B-12  1,000 mcg Per Tube Daily   Continuous Infusions: PRN Meds:.acetaminophen (TYLENOL) oral liquid 160 mg/5 mL, alum & mag hydroxide-simeth, hydrALAZINE Medications Prior to Admission:  Prior to Admission medications   Medication Sig Start Date End Date Taking? Authorizing Provider  aspirin 81 MG chewable tablet Chew 1 tablet (81 mg total) by mouth daily. 05/09/20  Yes Thurnell Lose, MD  carbidopa-levodopa (SINEMET IR) 25-100 MG tablet Take 1 tablet three times a day with meals Patient taking differently: Take 1 tablet by mouth 3 (three) times daily.  10/19/19  Yes Cameron Sprang, MD  carvedilol (COREG) 6.25 MG tablet Take 1 tablet (6.25 mg total) by mouth 2 (two) times daily with a meal. 05/09/20  Yes Thurnell Lose, MD  Cholecalciferol (VITAMIN D) 1000 UNITS capsule Take 1 capsule (1,000 Units total) by mouth daily. 09/09/12  Yes Plotnikov, Evie Lacks, MD  cyanocobalamin (,VITAMIN B-12,) 1000 MCG/ML injection 1 ml sq for 5 days, then 1 ml weekly for 4 weeks, then 1 ml every 2 weeks Patient taking differently: Inject 1,000 mcg into the muscle every 14 (fourteen) days.  11/19/18  Yes Plotnikov, Evie Lacks, MD  hydrALAZINE (APRESOLINE) 100 MG tablet Take 1 tablet (100 mg total) by mouth every 8 (eight) hours. 05/09/20  Yes Thurnell Lose, MD  loratadine (CLARITIN) 10  MG tablet Take 10 mg by mouth daily as needed for allergies.   Yes [provider]  rivastigmine (EXELON) 1.5 MG capsule Take 1 capsule (1.5 mg total) by mouth 2 (two) times daily. 10/19/19  Yes Cameron Sprang, MD  rosuvastatin (CRESTOR) 10 MG tablet Take 1 tablet (10 mg total) by mouth daily. 05/09/20  Yes Thurnell Lose, MD  traZODone (DESYREL) 50 MG tablet TAKE 2 TABLETS BY MOUTH AT BEDTIME Patient taking differently: Take 100 mg by mouth at bedtime.  10/19/19  Yes Cameron Sprang, MD  valACYclovir (VALTREX) 500 MG tablet Take 500 mg by mouth in the morning and at bedtime. 05/09/20  Yes [provider]  vitamin B-12 (CYANOCOBALAMIN) 1000 MCG tablet Take 1,000 mcg by mouth daily.   Yes [provider]  SYRINGE-NEEDLE, DISP, 3 ML (BD ECLIPSE SYRINGE) 25G X 1" 3 ML MISC Use sq as directed 11/19/18   Plotnikov, Evie Lacks, MD  torsemide (DEMADEX) 100 MG tablet Take 0.5-1 tablets (50-100 mg total) by mouth daily as needed. Patient not taking: Reported on 05/10/2020 04/13/20   Plotnikov, Evie Lacks, MD  valACYclovir (VALTREX) 1000 MG tablet Take 1 tablet (1,000 mg total) by mouth 3 (three) times daily. Patient not taking: Reported on 05/10/2020 11/17/19   Plotnikov, Evie Lacks, MD   Allergies  Allergen Reactions  . Amlodipine Other (See Comments)    Edema w/10 mg  . Lorazepam Other (See Comments)    DO NOT GIVE BENZO TO PARKINSON PT WITH CONCERNS FOR LEWY BODY DEMENTIA, CAUSES PARADOXICAL WORSENING OF THEIR AGITATION!  . Penicillins Hives    Has patient had a PCN reaction causing immediate rash, facial/tongue/throat swelling, SOB or lightheadedness with hypotension: Yes Has patient had a PCN reaction causing severe rash involving mucus membranes or skin necrosis: No Has patient had a PCN reaction that  required hospitalization No Has patient had a PCN reaction occurring within the last 10 years: No If all of the above answers are "NO", then may proceed with Cephalosporin use.   . Sulfonamide Derivatives Hives  . Atorvastatin     REACTION: leg cramps  . Sulfa Antibiotics Other (See Comments) and Palpitations    Sweating, passed out  . Telmisartan Other (See Comments)    ?fatigue   Review of Systems  Physical Exam  Vital Signs: BP (!) 155/74 (BP Location: Right Leg)   Pulse 71   Temp 98.6 F (37 C) (Oral)   Resp 20   Ht _0  (1.727 m)   Wt 82.8 kg   SpO2 100%   BMI 27.76 kg/m  Pain Scale: PAINAD POSS *See Group Information*: 1-Acceptable,Awake and alert Pain Score: 0-No pain   SpO2: SpO2: 100 % O2 Device:SpO2: 100 % O2 Flow Rate: .   IO: Intake/output summary:   Intake/Output Summary (Last 24 hours) at 05/25/2020 1353 Last data filed at 05/25/2020 1157 Gross per 24 hour  Intake 976 ml  Output 1151 ml  Net -175 ml    LBM: Last BM Date: 05/23/20 Baseline Weight: Weight: 71.1 kg Most recent weight: Weight: 82.8 kg      Palliative Assessment/Data: PPS 20%     Time In: 11:00 Time Out: 12:45 Time Total: 105 Greater than 50%  of this time was spent counseling and coordinating care related to the above assessment and plan.  Signed by: Lavena Bullion, NP   Please contact Palliative Medicine Team phone at [phone] for questions and concerns.  For individual provider: See Shea Evans

## 2020-05-25 NOTE — Progress Notes (Signed)
Pt was NTS x 1 through [facility] with only a very small amount of blood obtained. MD made aware.

## 2020-05-25 NOTE — Progress Notes (Signed)
PROGRESS NOTE    [name]  LYY:[id number] DOB: August 09, 1940 DOA: 05/10/2020 PCP: [name], MD   Brief Narrative   - 79 y.o.malewith medical history significant ofBPH, venous insufficiency, CKD, Parkinson's dementia, hypertension, falls, neutropenia, thrombocytopenia, recent admission(from 11/10-11/16 due to altered mental status, findings of old CVA, syncope, incidental Covid infection)who presents from his rehab facility with altered mental status, appears to be having fluctuating mental status, neurology work-up this admission is as significant including MRI, neurology were consulted, and they reported this is expected fluctuation if his progressive Parkinson disease and dementia, and recommendation is for him to continue his Parkinson meds, so core track tube was inserted, mentation has significantly improved, initially obtunded and unresponsive, currently communicative, but remains unsafe to swallow, on 11/20 2 AM patient was febrile, chest x-ray significant for pneumonia, bacterial, aspiration for which she is started on antibiotics.  Subjective:  No significant events overnight as discussed with staff, patient was suctioned by RT, small amount of blood left nares.  Assessment & Plan:   Acute encephalopathy in the setting of Parkinson's disease and dementia.   -Patient was recently admitted with similar complaints had extensive work-up including MRI of the brain showing an old infarct, echo, EEG, carotid ultrasound were all nonacute.  A1c was normal & LDL was 90.  He was started on statin and aspirin.  This admission CT of the head stable UA normal.  Repeat MRI, short and long-term EEG nonacute.  -Neurology input greatly appreciated, mental status changes and fluctuations appear to be natural progression of his illness related to Parkinson disease and dementia, no improvement of mentation despite being getting his Parkinson medicine regularly through his NG tube, discussed  at length with wife by phone, and daughter at bedside today, informs me he never wanted feeding tube in the past, palliative medicine consulted regarding goals of care, as it is anticipated for him to continue decline given the progressive nature of his disease to address goals of care, family is leaning towards hospice, patient had family meeting with palliative today, patient currently is DNR, further meeting tomorrow regarding position plan.  Aspiration pneumonia this admission.  -  Finished treatment with cefepime and Flagyl on 05/21/2020.  history of stroke continue aspirin and statin via NG tube.  history of CKD stage III with AKI in the setting of decreased p.o. intake.  Improved  with IV fluids.  His baseline creatinine is 1.5.  Creatinine is 1.14  Parkinson's disease with dementia followed by Dr. Karel Jarvis  Hypokalemia/hypophosphatemia- repleted and resolved.   Bilateral upper extremity edema Doppler shows no evidence of DVT.  COVID-19 infection patient received monoclonal antibody 05/04/2020.  He also finished 10 days of isolation.  FEN patient still has core track tube in place.  He was started on dysphagia 1 diet 05/18/2020, will hold tube feeds and challenge oral diet on 05/22/2020 onwards, gentle IV fluids .  Once neurological evaluation is done we will readdress if he requires PEG tube.  Have discussed with neurology they will provide their opinion on 05/23/2020 later in the day.    Nutrition Problem: Inadequate oral intake Etiology: lethargy/confusion, inability to eat  Signs/Symptoms: NPO status  Interventions: Tube feeding, Prostat  Estimated body mass index is 27.76 kg/m as calculated from the following:   Height as of this encounter: 5\' 8"  (1.727 m).   Weight as of this encounter: 82.8 kg.  DVT prophylaxis: Lovenox  Code Status: Full code Family Communication:   Discussed with patient's wife by  phone, and daughter at bedside 05/24/2020  Disposition Plan:  Status  is: Inpatient   Dispo: The patient is from: SNF              Anticipated d/c is to: Hospcie              Anticipated d/c date is: 2 to 3 days              Patient currently is not medically stable to d/c.   Consultants:  Neurology saw her on 05/12/2020 Procedures: None Antimicrobials:  Anti-infectives (From admission, onward)   Start     Dose/Rate Route Frequency Ordered Stop   05/15/20 0815  ceFEPIme (MAXIPIME) 2 g in sodium chloride 0.9 % 100 mL IVPB  Status:  Discontinued        2 g 200 mL/hr over 30 Minutes Intravenous Every 12 hours 05/15/20 0725 05/21/20 1036   05/15/20 0800  metroNIDAZOLE (FLAGYL) 50 mg/ml oral suspension 500 mg  Status:  Discontinued        500 mg Per Tube 3 times daily 05/15/20 0708 05/21/20 1036         Objective:  Vitals:   05/25/20 0401 05/25/20 0524 05/25/20 0700 05/25/20 1125  BP: (!) 160/72 140/62 (!) 135/57 (!) 155/74  Pulse: 83 88 77 71  Resp: 16 15 20 20   Temp: 98.1 F (36.7 C)  98.1 F (36.7 C) 98.6 F (37 C)  TempSrc: Axillary  Axillary Oral  SpO2: 99% 98% 98% 100%  Weight: 82.8 kg     Height:        Intake/Output Summary (Last 24 hours) at 05/25/2020 1419 Last data filed at 05/25/2020 1157 Gross per 24 hour  Intake 976 ml  Output 1151 ml  Net -175 ml   Filed Weights   05/22/20 2019 05/23/20 0400 05/25/20 0401  Weight: 85.5 kg 85 kg 82.8 kg    Examination:     Wakes up to verbal stimuli, answering some questions, follows some commands, but overall confused . Fair air entry bilaterally, no wheezing  Regular rate and rhythm, no rubs or gallops  Abdomen soft, nontender, nondistended  Extremities with no edema, clubbing or cyanosis   Data Reviewed: I have personally reviewed following labs and imaging studies  CBC: Recent Labs  Lab 05/19/20 0939 05/22/20 0215 05/23/20 0645 05/24/20 0439 05/25/20 0207  WBC 6.1 5.9 5.7 4.5 5.2  NEUTROABS  --  3.0 3.6 2.1 2.6  HGB 9.3* 9.0* 9.4* 8.6* 9.3*  HCT 27.7* 26.8* 27.7*  25.5* 26.8*  MCV 90.2 90.2 87.4 89.5 88.7  PLT 267 245 287 239 269   Basic Metabolic Panel: Recent Labs  Lab 05/19/20 0939 05/22/20 0215 05/23/20 0645 05/24/20 0439 05/25/20 0207  NA 138 135 134* 138 137  K 4.7 3.8 3.6 3.6 3.6  CL 109 103 100 103 104  CO2 23 26 27 28 26   GLUCOSE 114* 98 144* 146* 125*  BUN 36* 38* 36* 37* 41*  CREATININE 1.17 1.24 1.09 1.15 1.15  CALCIUM 8.3* 8.2* 8.3* 8.3* 8.5*  MG  --  1.9 2.0 1.9 1.9   GFR: Estimated Creatinine Clearance: 53.8 mL/min (by C-G formula based on SCr of 1.15 mg/dL). Liver Function Tests: Recent Labs  Lab 05/19/20 0939 05/22/20 0215 05/23/20 0645 05/24/20 0439 05/25/20 0207  AST 12* 21 25 17 16   ALT 5 8 19 12 15   ALKPHOS 42 36* 48 39 53  BILITOT 0.5 0.4 0.5 0.5 0.3  PROT 4.6* 4.6* 5.2*  4.7* 5.0*  ALBUMIN 1.9* 1.9* 2.2* 2.1* 2.3*   No results for input(s): LIPASE, AMYLASE in the last 168 hours. No results for input(s): AMMONIA in the last 168 hours. Coagulation Profile: No results for input(s): INR, PROTIME in the last 168 hours. Cardiac Enzymes: No results for input(s): CKTOTAL, CKMB, CKMBINDEX, TROPONINI in the last 168 hours. BNP (last 3 results) No results for input(s): PROBNP in the last 8760 hours. HbA1C: No results for input(s): HGBA1C in the last 72 hours. CBG: Recent Labs  Lab 05/24/20 1947 05/25/20 0001 05/25/20 0404 05/25/20 0751 05/25/20 1127  GLUCAP 100* 106* 125* 174* 111*   Lipid Profile: No results for input(s): CHOL, HDL, LDLCALC, TRIG, CHOLHDL, LDLDIRECT in the last 72 hours. Thyroid Function Tests: No results for input(s): TSH, T4TOTAL, FREET4, T3FREE, THYROIDAB in the last 72 hours. Anemia Panel: No results for input(s): VITAMINB12, FOLATE, FERRITIN, TIBC, IRON, RETICCTPCT in the last 72 hours. Sepsis Labs: Recent Labs  Lab 05/22/20 0215 05/23/20 0645 05/24/20 0439 05/25/20 0207  PROCALCITON <0.10 <0.10 <0.10 <0.10    No results found for this or any previous visit (from the  past 240 hour(s)).       Radiology Studies: No results found.  Scheduled Meds: . aspirin  81 mg Per Tube Daily  . carbidopa-levodopa  1 tablet Per Tube TID WC  . carvedilol  6.25 mg Per Tube BID WC  . cholecalciferol  1,000 Units Per Tube Daily  . enoxaparin (LOVENOX) injection  40 mg Subcutaneous Daily  . feeding supplement  237 mL Oral BID BM  . feeding supplement (OSMOLITE 1.5 CAL)  960 mL Per Tube Q24H  . feeding supplement (PROSource TF)  90 mL Per Tube BID  . free water  155 mL Per Tube Q4H  . pantoprazole sodium  40 mg Per Tube Daily  . sodium chloride flush  3 mL Intravenous Q12H  . vitamin B-12  1,000 mcg Per Tube Daily   Continuous Infusions:    LOS: 15 days     Huey Bienenstock, MD  05/25/2020, 2:19 PM

## 2020-05-25 NOTE — Plan of Care (Signed)
  Problem: Clinical Measurements: Goal: Ability to maintain clinical measurements within normal limits will improve Outcome: Progressing Goal: Will remain free from infection Outcome: Progressing Goal: Diagnostic test results will improve Outcome: Progressing Goal: Cardiovascular complication will be avoided Outcome: Progressing   Problem: Nutrition: Goal: Adequate nutrition will be maintained Outcome: Progressing   Problem: Skin Integrity: Goal: Risk for impaired skin integrity will decrease Outcome: Progressing   Problem: Education: Goal: Knowledge of General Education information will improve Description: Including pain rating scale, medication(s)/side effects and non-pharmacologic comfort measures Outcome: Not Progressing   Problem: Health Behavior/Discharge Planning: Goal: Ability to manage health-related needs will improve Outcome: Not Progressing   Problem: Activity: Goal: Risk for activity intolerance will decrease Outcome: Not Progressing   Problem: Coping: Goal: Level of anxiety will decrease Outcome: Not Progressing

## 2020-05-26 DIAGNOSIS — Z8673 Personal history of transient ischemic attack (TIA), and cerebral infarction without residual deficits: Secondary | ICD-10-CM

## 2020-05-26 DIAGNOSIS — Z789 Other specified health status: Secondary | ICD-10-CM

## 2020-05-26 DIAGNOSIS — E872 Acidosis: Secondary | ICD-10-CM

## 2020-05-26 DIAGNOSIS — Z66 Do not resuscitate: Secondary | ICD-10-CM

## 2020-05-26 LAB — GLUCOSE, CAPILLARY
Glucose-Capillary: 103 mg/dL — ABNORMAL HIGH (ref 70–99)
Glucose-Capillary: 115 mg/dL — ABNORMAL HIGH (ref 70–99)
Glucose-Capillary: 135 mg/dL — ABNORMAL HIGH (ref 70–99)
Glucose-Capillary: 96 mg/dL (ref 70–99)
Glucose-Capillary: 96 mg/dL (ref 70–99)

## 2020-05-26 NOTE — Progress Notes (Signed)
Civil engineer, contracting ([facility])  Received request to meet with family to explain different options for hospice, residential vs at home.  [facility] will reach out to family and meet with them Saturday to help determine next steps for d/c.  Wallis Bamberg RN, BSN, CCRN [facility] Liaison

## 2020-05-26 NOTE — Progress Notes (Signed)
Daily Progress Note   Patient Name: [name]       Date: 05/26/2020 DOB: [birth year]  Age: 79 y.o. MRN#: [id number] Attending Physician: Larry Patricia, MD Primary Care Physician: Larry Anger, MD Admit Date: 05/10/2020  Reason for Consultation/Follow-up: Disposition and Establishing goals of care  Subjective: Chart review performed. Received report from primary RN - no acute concerns. RN reports patient remains confused but is more awake and alert today. Patient is still only tolerating small amounts of PO intake.   Went to visit patient at bedside - no family/visitors present. Patient was working with PT - they were using sit to stand lift to transfer from bed to chair - patient needed 2+ assist with transfer even with lift. He seems weak and frail appearing.   Called wife/[name] - spoke with her and patient's daughter over the phone. Answered questions around NG tube with SNF rehab or hospice. Educated that patient would not be able to discharge to rehab with NG tube, he would need PEG placement. Hospice philosophy was reviewed and discussed life prolonging interventions vs focusing on comfort - family expressed understanding.   Daughter asked if patient's Sinemet IR was available in any other form than tablet. Their concern is the patient would not be able to get this medication if the NG tube is removed. Explained I would need to check with pharmacy. I called pharmacy and was told Senimet does come in an oral disintegrating tablet form; however, it is not available in house. I called the family back and notified them of the alternate form of medication - they expressed happiness and appreciation. I discussed we do not have it in house and if they chose to purse hospice, we  could discuss if getting this form would be possible.   Wife requested to meet at patient's bedside at 2pm today.  2:00 PM Met patient's wife/[name] and [name]'s sister at patient's bedside - called the patient's daughter via speakerphone so she could also be a part of the discussion. Family had questions around discharge options - discussed again in detail. Educated again that the patient would be leaving the hospital without an NG tube, regardless of what decision they made; however, he could leave with or without a PEG tube. Family clearly stated they were not interested in pursuing PEG  tube at this time - they stated the patient told them clearly he did not want that for himself and they were going to respect his wishes. Therefore, gave recommendation that rehab was not the best option for him. Discussed prognostication - the patient is able to eat/drink minimal amounts. Explained that the biggest concern at this time for the patient is his oral intake once tube feedings are stopped. Discussed adequate nutritional intake and explained that the patient may not be able to eat/drink enough to be sustained long term. Expectations at EOL were discussed and reviewed in detail. Discussed home hospice vs residential hospice in detail - answered all questions. The family would prefer to bring the patient home with hospice, but the wife is concerned and realizes he needs 24/7 assistance/care that she cannot provide alone due to a recent knee surgery. Discussed home health aid support in addition to home hospice - they requested more information. Answered questions on residential hospice services, but informed them that the patient may not qualify yet.   The family is not ready to make a final decision today. However, they do know they do not want to proceed with PEG or rehab - they will be discharging with hospice support.  Visit also consisted of discussions dealing with the complex and emotionally intense  issues of symptom management and palliative care in the setting of serious and potentially life-threatening illness. Palliative care team will continue to support patient, patient's family, and medical team.   All questions and concerns addressed. Encouraged to call with questions and/or concerns. PMT card provided.  Length of Stay: 16  Current Medications: Scheduled Meds:   aspirin  81 mg Per Tube Daily   carbidopa-levodopa  1 tablet Per Tube TID WC   carvedilol  6.25 mg Per Tube BID WC   cholecalciferol  1,000 Units Per Tube Daily   enoxaparin (LOVENOX) injection  40 mg Subcutaneous Daily   feeding supplement  237 mL Oral BID BM   feeding supplement (OSMOLITE 1.5 CAL)  960 mL Per Tube Q24H   feeding supplement (PROSource TF)  90 mL Per Tube BID   free water  155 mL Per Tube Q4H   pantoprazole sodium  40 mg Per Tube BID   sodium chloride flush  3 mL Intravenous Q12H   vitamin B-12  1,000 mcg Per Tube Daily    Continuous Infusions:   PRN Meds: acetaminophen (TYLENOL) oral liquid 160 mg/5 mL, alum & mag hydroxide-simeth, hydrALAZINE  Physical Exam Vitals and nursing note reviewed.  Constitutional:      General: He is not in acute distress.    Appearance: He is ill-appearing.  Pulmonary:     Effort: No respiratory distress.  Skin:    General: Skin is warm and dry.  Neurological:     Mental Status: He is alert.     Motor: Weakness present.  Psychiatric:        Attention and Perception: Attention normal.        Behavior: Behavior is cooperative.             Vital Signs: BP (!) 128/53 (BP Location: Left Leg)    Pulse 66    Temp 99.2 F (37.3 C) (Axillary)    Resp 15    Ht $R'5\' 8"'ov$  (1.727 m)    Wt 82.8 kg    SpO2 100%    BMI 27.76 kg/m  SpO2: SpO2: 100 % O2 Device: O2 Device: Room Air O2 Flow Rate:  Intake/output summary:   Intake/Output Summary (Last 24 hours) at 05/26/2020 1104 Last data filed at 05/26/2020 0418 Gross per 24 hour  Intake 791 ml  Output  1025 ml  Net -234 ml   LBM: Last BM Date: 05/25/20 Baseline Weight: Weight: 71.1 kg Most recent weight: Weight: 82.8 kg       Palliative Assessment/Data: PPS 30-40%      Patient Active Problem List   Diagnosis Date Noted   History of CVA (cerebrovascular accident) 05/11/2020   Acute encephalopathy 05/10/2020   AMS (altered mental status) 05/04/2020   Syncope 05/03/2020   Neutropenia ([place]) 05/03/2020   Thrombocytopenia ([place]) 05/03/2020   COVID-19 virus infection 05/03/2020   Shingles 11/17/2019   Hydronephrosis with renal and ureteral calculus obstruction 07/06/2019   Constipation 56/38/7564   Acute metabolic encephalopathy 33/29/5188   CRF (chronic renal failure), stage 3 (moderate) 03/18/2019   Pressure injury of skin 02/14/2019   ARF (acute renal failure) ([place]) 02/13/2019   Sepsis ([place]) 02/13/2019   Protein-calorie malnutrition, severe ([place]) 02/13/2019   Dementia due to Parkinson's disease without behavioral disturbance ([place]) 02/13/2019   Parkinson's syndrome ([place]) 02/13/2019   Infected sebaceous cyst 12/04/2018   Hallucinations 11/19/2018   Shoulder pain 11/19/2018   Vitamin B12 deficiency 11/19/2018   Glaucoma suspect with open angle 11/03/2018   Memory loss 09/21/2018   Falls frequently 09/21/2018   Fatigue 10/27/2017   Edema 07/29/2017   Chronic venous insufficiency 06/30/2017   Cellulitis and abscess of leg, except foot 06/30/2017   Ear pain, referred, right 06/10/2016   Urinary frequency 04/16/2016   Cerumen impaction 08/22/2015   Infection of right hand due to bite 04/20/2014   Hemifacial spasm 09/15/2013   Well adult exam 07/16/2013   Dermatochalasis of eyelid 01/01/2012   Other facial nerve disorders 01/01/2012   Myogenic ptosis 11/04/2011   Twitching 11/04/2011   Non-REM Parasomnia 10/14/2011   Dog bite of left hand 08/19/2011   Wound infection 08/19/2011   Hypokalemia 12/10/2010   Hyperglycemia  12/10/2010   Dyslipidemia    Benign prostatic hyperplasia    LEG CRAMPS 03/07/2009   OTITIS EXTERNA 08/30/2008   Prostatitis 08/30/2008   Essential hypertension 08/26/2007   ERECTILE DYSFUNCTION 08/26/2007    Palliative Care Assessment & Plan   Patient Profile: 79 y.o. male  with past medical history of Parkinson's dementia, hypertension, CKD, BPH, venous insufficiency, falls, and thrombocytopenia. He was recently admitted 11/10-11/16 with AMS and syncope; was found to have an old CVA and incidental Covid infection. He was discharged to SNF for rehab. He then presented to the emergency department on 05/10/2020 with AMS. His mental status has fluctuated throughout the hospitalization. He has had extensive neurology work-up this admission including MRI and EEG, all with non-acute findings. Per neurology, this is expected fluctuation for progressive Parkinson's dementia, and made recommendation for him to continue Parkinson meds so cortrak was placed. On 11/20, patient was febrile and chest x-ray showed aspiration pneumonia.    Assessment: Acute encephalopathy Aspiration pneumonia History of stroke AKI on CKD stage III Dysphagia Parkinson's disease Dementia  Recommendations/Plan: Continue current treatment for medical optimization before discharge Continue DNR/DNI as previously documented Family is clear they do not want to pursue PEG placement and are no longer considering discharge to rehab Family has decided they will pursue hospice, requested [facility]; they would like to speak with the hospice liaison to ask more questions and get more information - [facility] hospice liaison notified [facility] consulted for: family's request for home health  aid information to supplement hospice when the patient is home PMT will continue to follow holistically  Goals of Care and Additional Recommendations: Limitations on Scope of Treatment: Full Scope Treatment  Code Status:    Code Status Orders   (From admission, onward)         Start     Ordered   05/25/20 1353  Do not attempt resuscitation (DNR)  Continuous       Question Answer Comment  In the event of cardiac or respiratory ARREST Do not call a code blue   In the event of cardiac or respiratory ARREST Do not perform Intubation, CPR, defibrillation or ACLS   In the event of cardiac or respiratory ARREST Use medication by any route, position, wound care, and other measures to relive pain and suffering. May use oxygen, suction and manual treatment of airway obstruction as needed for comfort.      05/25/20 1352        Code Status History    Date Active Date Inactive Code Status Order ID Comments User Context   05/11/2020 0127 05/25/2020 1352 Full Code [id number]  Marcelyn Bruins, MD Inpatient   05/10/2020 2320 05/11/2020 0127 DNR [id number]  Marcelyn Bruins, MD ED   05/04/2020 0005 05/09/2020 1815 DNR [id number]  Florencia Reasons, MD Inpatient   07/04/2019 0754 07/06/2019 2210 DNR [id number]  Toy Baker, MD Inpatient   02/13/2019 1644 02/24/2019 1619 Full Code [id number]  Jani Gravel, MD ED   04/20/2014 1315 04/24/2014 1440 Full Code [id number]  Roseanne Kaufman, MD Inpatient   Advance Care Planning Activity      Prognosis:  < 6 months  Discharge Planning: To Be Determined will discharge with hospice; likely home hospice   Care plan was discussed with primary RN, Dr. Waldron Labs, [facility], hospice liaison, patient's family  Thank you for allowing the Palliative Medicine Team to assist in the care of this patient.   Total Time 90 minutes Prolonged Time Billed  yes       Greater than 50%  of this time was spent counseling and coordinating care related to the above assessment and plan.  Lin Landsman, NP  Please contact Palliative Medicine Team phone at [phone] for questions and concerns.

## 2020-05-26 NOTE — Progress Notes (Signed)
PROGRESS NOTE    [name]  YIR:[id number] DOB: May 27, 1941 DOA: 05/10/2020 PCP: [name], MD   Brief Narrative   - 79 y.o.malewith medical history significant ofBPH, venous insufficiency, CKD, Parkinson's dementia, hypertension, falls, neutropenia, thrombocytopenia, recent admission(from 11/10-11/16 due to altered mental status, findings of old CVA, syncope, incidental Covid infection)who presents from his rehab facility with altered mental status, appears to be having fluctuating mental status, neurology work-up this admission is as significant including MRI, neurology were consulted, and they reported this is expected fluctuation if his progressive Parkinson disease and dementia, and recommendation is for him to continue his Parkinson meds, so core track tube was inserted, mentation has significantly improved, initially obtunded and unresponsive, currently communicative, but remains unsafe to swallow, on 11/20 2 AM patient was febrile, chest x-ray significant for pneumonia, bacterial, aspiration for which she is started on antibiotics.  Subjective:  No significant events as discussed with staff overnight, patient did well with his swallow evaluation this morning and he has been advanced dysphagia 3 with thin liquid .  Assessment & Plan:   Acute encephalopathy in the setting of Parkinson's disease and dementia.   -Patient was recently admitted with similar complaints had extensive work-up including MRI of the brain showing an old infarct, echo, EEG, carotid ultrasound were all nonacute.  A1c was normal & LDL was 90.  He was started on statin and aspirin.  This admission CT of the head stable UA normal.  Repeat MRI, short and long-term EEG nonacute.  -Neurology input greatly appreciated, mental status changes and fluctuations appear to be natural progression of his illness related to Parkinson disease and dementia, no improvement of mentation despite being getting his  Parkinson medicine regularly through his NG tube, discussed at length with wife by phone, and daughter at bedside today, informs me he never wanted feeding tube in the past, palliative medicine consulted regarding goals of care, as it is anticipated for him to continue decline given the progressive nature of his disease to address goals of care, family is leaning towards hospice. -Patient has been followed closely by palliative, he is currently DNR, EKG has improved and he is on dysphagia 3 with thin liquid .  [facility] meeting today with palliative regarding goals of care.  Aspiration pneumonia this admission.  -  Finished treatment with cefepime and Flagyl on 05/21/2020.  history of stroke continue aspirin and statin via NG tube.  history of CKD stage III with AKI in the setting of decreased p.o. intake.  Improved  with IV fluids.  His baseline creatinine is 1.5.    Parkinson's disease with dementia followed by Dr. Karel Jarvis  Hypokalemia/hypophosphatemia- repleted and resolved.   Bilateral upper extremity edema Doppler shows no evidence of DVT.  COVID-19 infection patient received monoclonal antibody 05/04/2020.  He also finished 10 days of isolation.     Nutrition Problem: Inadequate oral intake Etiology: lethargy/confusion, inability to eat  Interventions: Tube feeding, Prostat  Estimated body mass index is 27.76 kg/m as calculated from the following:   Height as of this encounter: 5\' 8"  (1.727 m).   Weight as of this encounter: 82.8 kg.  DVT prophylaxis: Lovenox  Code Status: DNR Family Communication:  None at bedside, family meeting with palliative medicine this afternoon  Disposition Plan:  Status is: Inpatient   Dispo: The patient is from: SNF              Anticipated d/c is to: Hospcie  Anticipated d/c date is: 2 to 3 days              Patient currently is not medically stable to d/c.   Consultants:  Neurology saw her on 05/12/2020 Procedures:  None Antimicrobials:  Anti-infectives (From admission, onward)   Start     Dose/Rate Route Frequency Ordered Stop   05/15/20 0815  ceFEPIme (MAXIPIME) 2 g in sodium chloride 0.9 % 100 mL IVPB  Status:  Discontinued        2 g 200 mL/hr over 30 Minutes Intravenous Every 12 hours 05/15/20 0725 05/21/20 1036   05/15/20 0800  metroNIDAZOLE (FLAGYL) 50 mg/ml oral suspension 500 mg  Status:  Discontinued        500 mg Per Tube 3 times daily 05/15/20 0708 05/21/20 1036         Objective:  Vitals:   05/25/20 2025 05/26/20 0000 05/26/20 0400 05/26/20 1224  BP: (!) 150/62 (!) 138/56 (!) 128/53 (!) 118/56  Pulse: 73 75 66 60  Resp: 17 17 15 18   Temp: 97.7 F (36.5 C) 98.5 F (36.9 C) 99.2 F (37.3 C) 99 F (37.2 C)  TempSrc: Oral Oral Axillary Oral  SpO2: 99% 99% 100% 100%  Weight:      Height:        Intake/Output Summary (Last 24 hours) at 05/26/2020 1520 Last data filed at 05/26/2020 0418 Gross per 24 hour  Intake 731 ml  Output 1025 ml  Net -294 ml   Filed Weights   05/22/20 2019 05/23/20 0400 05/25/20 0401  Weight: 85.5 kg 85 kg 82.8 kg    Examination:     Patient is more awake and conversant today, he is more appropriate answering most of questions appropriately and follow commands  Fair air entry bilaterally, no wheezing or rhonchi  Regular rate and rhythm, no rubs or gallops . Abdomen soft, nontender, nondistended  Extremities with no edema, clubbing or cyanosis   Data Reviewed: I have personally reviewed following labs and imaging studies  CBC: Recent Labs  Lab 05/22/20 0215 05/23/20 0645 05/24/20 0439 05/25/20 0207  WBC 5.9 5.7 4.5 5.2  NEUTROABS 3.0 3.6 2.1 2.6  HGB 9.0* 9.4* 8.6* 9.3*  HCT 26.8* 27.7* 25.5* 26.8*  MCV 90.2 87.4 89.5 88.7  PLT 245 287 239 269   Basic Metabolic Panel: Recent Labs  Lab 05/22/20 0215 05/23/20 0645 05/24/20 0439 05/25/20 0207  NA 135 134* 138 137  K 3.8 3.6 3.6 3.6  CL 103 100 103 104  CO2 26 27 28 26    GLUCOSE 98 144* 146* 125*  BUN 38* 36* 37* 41*  CREATININE 1.24 1.09 1.15 1.15  CALCIUM 8.2* 8.3* 8.3* 8.5*  MG 1.9 2.0 1.9 1.9   GFR: Estimated Creatinine Clearance: 53.8 mL/min (by C-G formula based on SCr of 1.15 mg/dL). Liver Function Tests: Recent Labs  Lab 05/22/20 0215 05/23/20 0645 05/24/20 0439 05/25/20 0207  AST 21 25 17 16   ALT 8 19 12 15   ALKPHOS 36* 48 39 53  BILITOT 0.4 0.5 0.5 0.3  PROT 4.6* 5.2* 4.7* 5.0*  ALBUMIN 1.9* 2.2* 2.1* 2.3*   No results for input(s): LIPASE, AMYLASE in the last 168 hours. No results for input(s): AMMONIA in the last 168 hours. Coagulation Profile: No results for input(s): INR, PROTIME in the last 168 hours. Cardiac Enzymes: No results for input(s): CKTOTAL, CKMB, CKMBINDEX, TROPONINI in the last 168 hours. BNP (last 3 results) No results for input(s): PROBNP in the last 8760  hours. HbA1C: No results for input(s): HGBA1C in the last 72 hours. CBG: Recent Labs  Lab 05/25/20 1127 05/25/20 1654 05/25/20 1946 05/26/20 0800 05/26/20 1228  GLUCAP 111* 112* 103* 135* 96   Lipid Profile: No results for input(s): CHOL, HDL, LDLCALC, TRIG, CHOLHDL, LDLDIRECT in the last 72 hours. Thyroid Function Tests: No results for input(s): TSH, T4TOTAL, FREET4, T3FREE, THYROIDAB in the last 72 hours. Anemia Panel: No results for input(s): VITAMINB12, FOLATE, FERRITIN, TIBC, IRON, RETICCTPCT in the last 72 hours. Sepsis Labs: Recent Labs  Lab 05/22/20 0215 05/23/20 0645 05/24/20 0439 05/25/20 0207  PROCALCITON <0.10 <0.10 <0.10 <0.10    No results found for this or any previous visit (from the past 240 hour(s)).       Radiology Studies: No results found.  Scheduled Meds: . aspirin  81 mg Per Tube Daily  . carbidopa-levodopa  1 tablet Per Tube TID WC  . carvedilol  6.25 mg Per Tube BID WC  . cholecalciferol  1,000 Units Per Tube Daily  . enoxaparin (LOVENOX) injection  40 mg Subcutaneous Daily  . feeding supplement  237 mL  Oral BID BM  . feeding supplement (OSMOLITE 1.5 CAL)  960 mL Per Tube Q24H  . feeding supplement (PROSource TF)  90 mL Per Tube BID  . free water  155 mL Per Tube Q4H  . pantoprazole sodium  40 mg Per Tube BID  . sodium chloride flush  3 mL Intravenous Q12H  . vitamin B-12  1,000 mcg Per Tube Daily   Continuous Infusions:    LOS: 16 days     Huey Bienenstock, MD  05/26/2020, 3:20 PM

## 2020-05-26 NOTE — Progress Notes (Signed)
Physical Therapy Treatment Patient Details Name: [name] MRN: [id number] DOB: 04-22-1941 Today's Date: 05/26/2020    History of Present Illness 79yo male with recent admit 11/10 to 11/16 due to AMS/syncope/incidental Covid, now returning to the ED from his SNF with AMS and non-responsiveness. Admitted with acute encephalopathy and parkinsons dementia in the context of Covid. 11/26 noted bil UE weeping with blistersPMH HLD, HTN, parasomnia, CKD, parkinsons dementia, falls    PT Comments    Pt asking where his in on entry. Pt surprised to hear he is in the hospital. When asked which hospital he says [facility]. Pt agreeable to getting up with therapy to the chair. Pt requires modAx2 to come to EoB and maximal cuing to adjust for R lateral lean. Pt able to correct but not maintain without outside report. Pt is maxAx2 to come to standing in stedy and modA to stand from elevated Stedy pads to sit in recliner. Pt obviously fatigued after moving to chair. D/c plan remains appropriate at this time. PT will continue to follow acutely.    Follow Up Recommendations  SNF;Supervision/Assistance - 24 hour     Equipment Recommendations  Wheelchair (measurements PT);Wheelchair cushion (measurements PT);Other (comment) (hoyer lift)       Precautions / Restrictions Precautions Precautions: Fall Restrictions Weight Bearing Restrictions: No    Mobility  Bed Mobility Overal bed mobility: Needs Assistance Bed Mobility: Supine to Sit;Sit to Supine     Supine to sit: +2 for physical assistance;HOB elevated;Mod assist     General bed mobility comments: increased cuing for sequencing, requires assist for initiation,, increased R lateral lean   Transfers Overall transfer level: Needs assistance   Transfers: Sit to/from Stand Sit to Stand: Mod assist;Max assist;+2 safety/equipment         General transfer comment: maxAx2 for coming all the way to upright with Stedy for pad placement, modA to  rise from higher Stedy pads         Balance Overall balance assessment: Needs assistance Sitting-balance support: Feet supported Sitting balance-Leahy Scale: Poor Sitting balance - Comments: leans to rt (increases with fatigue) Postural control: Right lateral lean                                  Cognition Arousal/Alertness: Awake/alert Behavior During Therapy: WFL for tasks assessed/performed Overall Cognitive Status: Impaired/Different from baseline Area of Impairment: Following commands;Problem solving;Awareness;Orientation                 Orientation Level: Disoriented to;Time;Situation;Place Current Attention Level: Sustained   Following Commands: Follows one step commands with increased time;Follows one step commands inconsistently   Awareness: Intellectual Problem Solving: Slow processing;Decreased initiation;Requires verbal cues;Requires tactile cues;Difficulty sequencing        Exercises Other Exercises Other Exercises: seated left lateral flexion to prop on left elbow and then raise back up to sitting    General Comments General comments (skin integrity, edema, etc.): VSS on RA      Pertinent Vitals/Pain Pain Assessment: No/denies pain Faces Pain Scale: No hurt           PT Goals (current goals can now be found in the care plan section) Acute Rehab PT Goals Patient Stated Goal: ride his [address] [employer]  PT Goal Formulation: Patient unable to participate in goal setting Time For Goal Achievement: 05/26/20 Potential to Achieve Goals: Fair Progress towards PT goals: Progressing toward goals    Frequency  Min 2X/week      PT Plan Current plan remains appropriate       AM-PAC PT "6 Clicks" Mobility   Outcome Measure  Help needed turning from your back to your side while in a flat bed without using bedrails?: A Lot Help needed moving from lying on your back to sitting on the side of a flat bed without using bedrails?: Total Help  needed moving to and from a bed to a chair (including a wheelchair)?: Total Help needed standing up from a chair using your arms (e.g., wheelchair or bedside chair)?: Total Help needed to walk in hospital room?: Total Help needed climbing 3-5 steps with a railing? : Total 6 Click Score: 7    End of Session Equipment Utilized During Treatment: Gait belt Activity Tolerance: Patient limited by fatigue Patient left: in bed;with call bell/phone within reach;with bed alarm set Nurse Communication: Mobility status PT Visit Diagnosis: Other abnormalities of gait and mobility (R26.89);Difficulty in walking, not elsewhere classified (R26.2);Muscle weakness (generalized) (M62.81);History of falling (Z91.81)     Time: 1103-1130 PT Time Calculation (min) (ACUTE ONLY): 27 min  Charges:  $Therapeutic Activity: 23-37 mins                     Jailin Moomaw B. Beverely Risen PT, DPT Acute Rehabilitation Services Pager [phone] Office [phone]    Elon Alas Fleet 05/26/2020, 12:51 PM

## 2020-05-26 NOTE — Progress Notes (Addendum)
  Speech Language Pathology Treatment: Dysphagia  Patient Details Name: [name] MRN: [id number] DOB: [birth year] Today's Date: 05/26/2020 Time: 0254-2706 SLP Time Calculation (min) (ACUTE ONLY): 34 min  Assessment / Plan / Recommendation Clinical Impression  Pt seen to assess po tolerance, appropriateness for dietary advancement and for education. He is alert and requesting a sandwich and cola.   Mild wet vocal quality noted that cleared with cued cough and intake.  Dentures in place for po trials and SLP had pt help to self feed hand over hand using fork, spoon, cup and straw for proprioception benefits.  Tendency to take large boluses, continuing to place more food in his mouth without clearance noted. With max cues, pt took small boluses and using liquids to aid solid clearance helpful x2.  Cough noted x2 with liquids during intake with large boluses.  Recommend advance diet to dys3/thin with strict precautions for pt's enjoyment of solids.  He was fully alert during the session and engaging him with hand over hand aided stimulation.   Skilled intervention included determining helpful compensations and informing pt to findings.   Per SLP conversation with MD, pt will follow up with hospice as OP and focus is leaning toward comfort.    HPI HPI: 79 y.o. male with a history of Parkinson's disease, dementia, increasing falls at home, HTN, covid-19 vaccinations, and BPH who presented to the ED from home after 2 episodes of loss of consciousness. He has had altered mental status since arrival, so history is limited.age-indeterminate infarct of the posterior left cerebellum was noted      SLP Plan  Continue with current plan of care       Recommendations  Diet recommendations: Dysphagia 3 (mechanical soft);Thin liquid Liquids provided via: Straw;Cup Medication Administration: Via alternative means (or with puree whole if tube to be removed) Supervision: Full supervision/cueing for  compensatory strategies;Trained caregiver to feed patient Compensations: Slow rate;Small sips/bites;Other (Comment);Follow solids with liquid (delay up to 35 seconds with soft solids, assure swallows before giving more) Postural Changes and/or Swallow Maneuvers: Seated upright 90 degrees;Upright 30-60 min after meal                Oral Care Recommendations: Oral care BID SLP Visit Diagnosis: Dysphagia, unspecified (R13.10) Plan: Continue with current plan of care       GO                Chales Abrahams 05/26/2020, 9:49 AM  Rolena Infante, MS [facility] SLP Acute Rehab Services Office [phone] Pager [phone]

## 2020-05-27 LAB — GLUCOSE, CAPILLARY
Glucose-Capillary: 115 mg/dL — ABNORMAL HIGH (ref 70–99)
Glucose-Capillary: 112 mg/dL — ABNORMAL HIGH (ref 70–99)
Glucose-Capillary: 102 mg/dL — ABNORMAL HIGH (ref 70–99)
Glucose-Capillary: 100 mg/dL — ABNORMAL HIGH (ref 70–99)
Glucose-Capillary: 98 mg/dL (ref 70–99)

## 2020-05-27 NOTE — NC FL2 (Signed)
MEDICAID FL2 LEVEL OF CARE SCREENING TOOL     IDENTIFICATION  Patient Name: [name] Birthdate: [birth year] Sex: male Admission Date (Current Location): 05/10/2020  [facility] and [place] Number:  Producer, television/film/video and Address:  [facility] [place], [address] [place], [place] [address]      Provider Number: [id number]  Attending Physician Name and Address:  Starleen Arms, MD  Relative Name and Phone Number:  Deante Blough [phone]    Current Level of Care: Hospital Recommended Level of Care: Skilled Nursing Facility Prior Approval Number:    Date Approved/Denied:   PASRR Number: [id number]  Discharge Plan: SNF    Current Diagnoses: Patient Active Problem List   Diagnosis Date Noted  . History of CVA (cerebrovascular accident) 05/11/2020  . Acute encephalopathy 05/10/2020  . AMS (altered mental status) 05/04/2020  . Syncope 05/03/2020  . Neutropenia (HCC) 05/03/2020  . Thrombocytopenia (HCC) 05/03/2020  . COVID-19 virus infection 05/03/2020  . Shingles 11/17/2019  . Hydronephrosis with renal and ureteral calculus obstruction 07/06/2019  . Constipation 07/03/2019  . Acute metabolic encephalopathy 07/03/2019  . CRF (chronic renal failure), stage 3 (moderate) 03/18/2019  . Pressure injury of skin 02/14/2019  . ARF (acute renal failure) (HCC) 02/13/2019  . Sepsis (HCC) 02/13/2019  . Protein-calorie malnutrition, severe (HCC) 02/13/2019  . 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 Parkinson's syndrome (HCC) 02/13/2019  . Infected sebaceous cyst 12/04/2018  . Hallucinations 11/19/2018  . Shoulder pain 11/19/2018  . Vitamin B12 deficiency 11/19/2018  . 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 Memory loss 09/21/2018  . Falls frequently 09/21/2018  . Fatigue 10/27/2017  . Edema 07/29/2017  . Chronic venous insufficiency 06/30/2017  . Cellulitis and abscess of leg,  except foot 06/30/2017  . Ear pain, referred, right 06/10/2016  . Urinary frequency 04/16/2016  . Cerumen impaction 08/22/2015  . Infection of right hand due to bite 04/20/2014  . Hemifacial spasm 09/15/2013  . Well adult exam 07/16/2013  . Dermatochalasis of eyelid 01/01/2012  . Other facial nerve disorders 01/01/2012  . 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 Twitching 11/04/2011  . Non-REM Parasomnia 10/14/2011  . Dog bite of left hand 08/19/2011  . Wound infection 08/19/2011  . Hypokalemia 12/10/2010  . Hyperglycemia 12/10/2010  . Dyslipidemia   . Benign prostatic hyperplasia   . LEG CRAMPS 03/07/2009  . OTITIS EXTERNA 08/30/2008  . Prostatitis 08/30/2008  . Essential hypertension 08/26/2007  . ERECTILE DYSFUNCTION 08/26/2007    Orientation RESPIRATION BLADDER Height & Weight     Self, Time, Situation, Place  Normal Incontinent Weight: 184 lb 1.4 oz (83.5 kg) Height:  5\' 8"  (172.7 cm)  BEHAVIORAL SYMPTOMS/MOOD NEUROLOGICAL BOWEL NUTRITION STATUS      Incontinent Diet  AMBULATORY STATUS COMMUNICATION OF NEEDS Skin   Extensive Assist Verbally Normal                       Personal Care Assistance Level of Assistance  Bathing, Feeding, Dressing Bathing Assistance: Maximum assistance Feeding assistance: Maximum assistance Dressing Assistance: Maximum assistance     Functional Limitations Info  Sight, Hearing, Speech Sight Info: Adequate Hearing Info: Adequate Speech Info: Adequate    SPECIAL CARE FACTORS FREQUENCY  PT (By licensed PT), OT (By licensed OT)     PT Frequency: 5x per week OT Frequency: 5x per week            Contractures Contractures  Info: Not present    Additional Factors Info  Code Status, Allergies Code Status Info: DNR Allergies Info: Amlodipine Atorvastatin Lorazepam Pencillins sulfonamide Derivatives Telmisartan Sulfa Antibiotics           Current Medications (05/27/2020):  This is the current hospital active medication list Current  Facility-Administered Medications  Medication Dose Route Frequency Provider Last Rate Last Admin  . acetaminophen (TYLENOL) 160 MG/5ML solution 650 mg  650 mg Per Tube Q6H PRN Elgergawy, Leana Roe, MD      . alum & mag hydroxide-simeth (MAALOX/MYLANTA) 200-200-20 MG/5ML suspension 30 mL  30 mL Per Tube Q6H PRN Elgergawy, Leana Roe, MD   30 mL at 05/25/20 1053  . aspirin chewable tablet 81 mg  81 mg Per Tube Daily Elgergawy, Leana Roe, MD   81 mg at 05/27/20 0906  . carbidopa-levodopa (SINEMET IR) 25-100 MG per tablet immediate release 1 tablet  1 tablet Per Tube TID WC Elgergawy, Leana Roe, MD   1 tablet at 05/27/20 1239  . carvedilol (COREG) tablet 6.25 mg  6.25 mg Per Tube BID WC Elgergawy, Leana Roe, MD   6.25 mg at 05/27/20 0745  . cholecalciferol (VITAMIN D3) tablet 1,000 Units  1,000 Units Per Tube Daily Elgergawy, Leana Roe, MD   1,000 Units at 05/27/20 0906  . enoxaparin (LOVENOX) injection 40 mg  40 mg Subcutaneous Daily Lodema Hong A, RPH   40 mg at 05/27/20 0906  . feeding supplement (ENSURE ENLIVE / ENSURE PLUS) liquid 237 mL  237 mL Oral BID BM Alwyn Ren, MD   237 mL at 05/27/20 0908  . feeding supplement (OSMOLITE 1.5 CAL) liquid 960 mL  960 mL Per Tube Q24H Alwyn Ren, MD   Stopped at 05/27/20 [id number]  . feeding supplement (PROSource TF) liquid 90 mL  90 mL Per Tube BID Alwyn Ren, MD   90 mL at 05/27/20 0908  . free water 155 mL  155 mL Per Tube Q4H Elgergawy, Leana Roe, MD   155 mL at 05/27/20 1200  . hydrALAZINE (APRESOLINE) injection 10 mg  10 mg Intravenous Q6H PRN Leroy Sea, MD   10 mg at 05/25/20 1557  . pantoprazole sodium (PROTONIX) 40 mg/20 mL oral suspension 40 mg  40 mg Per Tube BID Elgergawy, Leana Roe, MD   40 mg at 05/27/20 0906  . sodium chloride flush (NS) 0.9 % injection 3 mL  3 mL Intravenous Q12H Synetta Fail, MD   3 mL at 05/27/20 0909  . vitamin B-12 (CYANOCOBALAMIN) tablet 1,000 mcg  1,000 mcg Per Tube Daily Elgergawy, Leana Roe, MD   1,000 mcg at 05/27/20 0906     Discharge Medications: Please see discharge summary for a list of discharge medications.  Relevant Imaging Results:  Relevant Lab Results:   Additional Information SS# [id number] [place], [place]

## 2020-05-27 NOTE — TOC Progression Note (Signed)
Transition of Care [facility]) - Progression Note    Patient Details  Name: [name] MRN: [id number] Date of Birth: 20-Oct-1940  Transition of Care [facility]) CM/SW Contact  Shinichi Anguiano [place], [place] Phone Number: 05/27/2020, 2:17 PM  Clinical Narrative:    Met with patient, patient's spouse and daughter at bedside. Discussed families decision for patient to return to rehab. Patient agrees. Families first choice is for patient to return to [facility], however they are agreeable for patient's information to be sent out  to additional facilities in the event [name] is not available. Fl2 completed and faxed out. TOC team to follow up with patient and his family to review bed offers.  [place], LCSW Transition of Care  [phone]       Barriers to Discharge: Continued Medical Work up  Expected Discharge Plan and Services   In-house Referral: Clinical Social Work                                             Social Determinants of Health (SDOH) Interventions    Readmission Risk Interventions No flowsheet data found.

## 2020-05-27 NOTE — Progress Notes (Signed)
PROGRESS NOTE    [name]  WUJ:[id number] DOB: 1940/12/10 DOA: 05/10/2020 PCP: [name], MD   Brief Narrative   - 79 y.o.malewith medical history significant ofBPH, venous insufficiency, CKD, Parkinson's dementia, hypertension, falls, neutropenia, thrombocytopenia, recent admission(from 11/10-11/16 due to altered mental status, findings of old CVA, syncope, incidental Covid infection)who presents from his rehab facility with altered mental status, appears to be having fluctuating mental status, neurology work-up this admission is as significant including MRI, neurology were consulted, and they reported this is expected fluctuation if his progressive Parkinson disease and dementia, and recommendation is for him to continue his Parkinson meds, so core track tube was inserted, mentation has significantly improved, initially obtunded and unresponsive, currently communicative, but remains unsafe to swallow, on 11/20 2 AM patient was febrile, chest x-ray significant for pneumonia, bacterial, aspiration for which she is started on antibiotics.  Subjective:  No significant events as discussed with staff overnight, been tolerating dysphagia 3 diet with thin liquid . Assessment & Plan:   Acute encephalopathy in the setting of Parkinson's disease and dementia.   -Patient was recently admitted with similar complaints had extensive work-up including MRI of the brain showing an old infarct, echo, EEG, carotid ultrasound were all nonacute.  A1c was normal & LDL was 90.  He was started on statin and aspirin.  This admission CT of the head stable UA normal.  Repeat MRI, short and long-term EEG nonacute.  -Neurology input greatly appreciated, mental status changes and fluctuations appear to be natural progression of his illness related to Parkinson disease and dementia, no improvement of mentation despite being getting his Parkinson medicine regularly through his NG tube, discussed at length  with wife by phone, and daughter at bedside today, informs me he never wanted feeding tube in the past, palliative medicine consulted regarding goals of care, he was seen by hospice today, patient is not a candidate for residential hospice, at this point family think they can take care of him at home with hospice and they do request facility with palliative on board., -Patient has been followed closely by palliative, he is currently DNR, dysphagia has improved and he is on dysphagia 3 with thin liquid .    Aspiration pneumonia this admission.  -  Finished treatment with cefepime and Flagyl on 05/21/2020.  history of stroke continue aspirin and statin via NG tube.  history of CKD stage III with AKI in the setting of decreased p.o. intake.  Improved  with IV fluids.  His baseline creatinine is 1.5.    Parkinson's disease with dementia followed by Dr. Karel Jarvis  Hypokalemia/hypophosphatemia- repleted and resolved.   Bilateral upper extremity edema Doppler shows no evidence of DVT.  COVID-19 infection patient received monoclonal antibody 05/04/2020.  He also finished 10 days of isolation.     Nutrition Problem: Inadequate oral intake Etiology: lethargy/confusion, inability to eat  Interventions: Tube feeding, Prostat  Estimated body mass index is 27.99 kg/m as calculated from the following:   Height as of this encounter: 5\' 8"  (1.727 m).   Weight as of this encounter: 83.5 kg.  DVT prophylaxis: Lovenox  Code Status: DNR Family Communication:  None at bedside, family meeting with palliative medicine this afternoon  Disposition Plan:  Status is: Inpatient   Dispo: The patient is from: SNF              Anticipated d/c is to: Hospcie vs SNF with palliaitive  Anticipated d/c date is: 2 to 3 days              Patient currently is not medically stable to d/c.   Consultants:  Neurology saw her on 05/12/2020 Procedures: None Antimicrobials:  Anti-infectives (From  admission, onward)   Start     Dose/Rate Route Frequency Ordered Stop   05/15/20 0815  ceFEPIme (MAXIPIME) 2 g in sodium chloride 0.9 % 100 mL IVPB  Status:  Discontinued        2 g 200 mL/hr over 30 Minutes Intravenous Every 12 hours 05/15/20 0725 05/21/20 1036   05/15/20 0800  metroNIDAZOLE (FLAGYL) 50 mg/ml oral suspension 500 mg  Status:  Discontinued        500 mg Per Tube 3 times daily 05/15/20 0708 05/21/20 1036         Objective:  Vitals:   05/26/20 0400 05/26/20 1224 05/27/20 0336 05/27/20 1257  BP: (!) 128/53 (!) 118/56 137/62 139/66  Pulse: 66 60 68 62  Resp: 15 18 18 15   Temp: 99.2 F (37.3 C) 99 F (37.2 C) 98.4 F (36.9 C) 98.5 F (36.9 C)  TempSrc: Axillary Oral Oral Oral  SpO2: 100% 100% 100% 96%  Weight:   83.5 kg   Height:        Intake/Output Summary (Last 24 hours) at 05/27/2020 1417 Last data filed at 05/27/2020 1243 Gross per 24 hour  Intake 180 ml  Output 1525 ml  Net -1345 ml   Filed Weights   05/23/20 0400 05/25/20 0401 05/27/20 0336  Weight: 85 kg 82.8 kg 83.5 kg    Examination:     Patient is awake and conversant today, appropriate answering most of questions, but he is demented at baseline, following commands . Fair air entry bilaterally, no wheezing or rhonchi  Regular rate and rhythm, no rubs or gallops . Abdomen soft, nontender, nondistended  Extremities with no edema, clubbing or cyanosis   Data Reviewed: I have personally reviewed following labs and imaging studies  CBC: Recent Labs  Lab 05/22/20 0215 05/23/20 0645 05/24/20 0439 05/25/20 0207  WBC 5.9 5.7 4.5 5.2  NEUTROABS 3.0 3.6 2.1 2.6  HGB 9.0* 9.4* 8.6* 9.3*  HCT 26.8* 27.7* 25.5* 26.8*  MCV 90.2 87.4 89.5 88.7  PLT 245 287 239 269   Basic Metabolic Panel: Recent Labs  Lab 05/22/20 0215 05/23/20 0645 05/24/20 0439 05/25/20 0207  NA 135 134* 138 137  K 3.8 3.6 3.6 3.6  CL 103 100 103 104  CO2 26 27 28 26   GLUCOSE 98 144* 146* 125*  BUN 38* 36* 37* 41*   CREATININE 1.24 1.09 1.15 1.15  CALCIUM 8.2* 8.3* 8.3* 8.5*  MG 1.9 2.0 1.9 1.9   GFR: Estimated Creatinine Clearance: 53.9 mL/min (by C-G formula based on SCr of 1.15 mg/dL). Liver Function Tests: Recent Labs  Lab 05/22/20 0215 05/23/20 0645 05/24/20 0439 05/25/20 0207  AST 21 25 17 16   ALT 8 19 12 15   ALKPHOS 36* 48 39 53  BILITOT 0.4 0.5 0.5 0.3  PROT 4.6* 5.2* 4.7* 5.0*  ALBUMIN 1.9* 2.2* 2.1* 2.3*   No results for input(s): LIPASE, AMYLASE in the last 168 hours. No results for input(s): AMMONIA in the last 168 hours. Coagulation Profile: No results for input(s): INR, PROTIME in the last 168 hours. Cardiac Enzymes: No results for input(s): CKTOTAL, CKMB, CKMBINDEX, TROPONINI in the last 168 hours. BNP (last 3 results) No results for input(s): PROBNP in the last 8760 hours.  HbA1C: No results for input(s): HGBA1C in the last 72 hours. CBG: Recent Labs  Lab 05/26/20 1955 05/26/20 2349 05/27/20 0338 05/27/20 0747 05/27/20 1305  GLUCAP 96 115* 115* 112* 102*   Lipid Profile: No results for input(s): CHOL, HDL, LDLCALC, TRIG, CHOLHDL, LDLDIRECT in the last 72 hours. Thyroid Function Tests: No results for input(s): TSH, T4TOTAL, FREET4, T3FREE, THYROIDAB in the last 72 hours. Anemia Panel: No results for input(s): VITAMINB12, FOLATE, FERRITIN, TIBC, IRON, RETICCTPCT in the last 72 hours. Sepsis Labs: Recent Labs  Lab 05/22/20 0215 05/23/20 0645 05/24/20 0439 05/25/20 0207  PROCALCITON <0.10 <0.10 <0.10 <0.10    No results found for this or any previous visit (from the past 240 hour(s)).       Radiology Studies: No results found.  Scheduled Meds: . aspirin  81 mg Per Tube Daily  . carbidopa-levodopa  1 tablet Per Tube TID WC  . carvedilol  6.25 mg Per Tube BID WC  . cholecalciferol  1,000 Units Per Tube Daily  . enoxaparin (LOVENOX) injection  40 mg Subcutaneous Daily  . feeding supplement  237 mL Oral BID BM  . feeding supplement (OSMOLITE 1.5  CAL)  960 mL Per Tube Q24H  . feeding supplement (PROSource TF)  90 mL Per Tube BID  . free water  155 mL Per Tube Q4H  . pantoprazole sodium  40 mg Per Tube BID  . sodium chloride flush  3 mL Intravenous Q12H  . vitamin B-12  1,000 mcg Per Tube Daily   Continuous Infusions:    LOS: 17 days     Huey Bienenstock, MD  05/27/2020, 2:17 PM

## 2020-05-27 NOTE — Progress Notes (Signed)
Manufacturing engineer (ACC)  Met with wife, daughter and pt at the bedside. [name] is alert and oriented x 2, he does intermittently engage in conversation.   Discussed residential hospice.  He is not a candidate unfortunately.  Discussed hospice at home.  He is eligible for that, however the family is not agreeable to this. They felt that hospice would provide much more support. We discussed PCG and the cost associated with this.  We discussed having their 5 children help with care. This is also not feasible.  Many work various shifts and only one lives in the city close by.  The family ultimately want to pursue SNF.  Discussed that even if he physically can participate he likely will return to the hospital due to his swallowing dysfunction.  They verbalize understanding but do not feel like they can "give up" at this time.  They would like to see if he could go to [facility] for SNF.    Updated TOC and PMT of this discussion.  If he does go SNF, they requested that we follow with our outpatient palliative care program.  Venia Carbon RN, BSN, [facility] Liaison

## 2020-05-28 LAB — GLUCOSE, CAPILLARY
Glucose-Capillary: 106 mg/dL — ABNORMAL HIGH (ref 70–99)
Glucose-Capillary: 107 mg/dL — ABNORMAL HIGH (ref 70–99)
Glucose-Capillary: 107 mg/dL — ABNORMAL HIGH (ref 70–99)
Glucose-Capillary: 115 mg/dL — ABNORMAL HIGH (ref 70–99)
Glucose-Capillary: 132 mg/dL — ABNORMAL HIGH (ref 70–99)
Glucose-Capillary: 98 mg/dL (ref 70–99)

## 2020-05-28 NOTE — Progress Notes (Signed)
PROGRESS NOTE    [name]  HKV:[id number] DOB: 30-Aug-1940 DOA: 05/10/2020 PCP: [name], MD   Brief Narrative   - 79 y.o.malewith medical history significant ofBPH, venous insufficiency, CKD, Parkinson's dementia, hypertension, falls, neutropenia, thrombocytopenia, recent admission(from 11/10-11/16 due to altered mental status, findings of old CVA, syncope, incidental Covid infection)who presents from his rehab facility with altered mental status, appears to be having fluctuating mental status, neurology work-up this admission is as significant including MRI, neurology were consulted, and they reported this is expected fluctuation if his progressive Parkinson disease and dementia, and recommendation is for him to continue his Parkinson meds, so core track tube was inserted, mentation has significantly improved, initially obtunded and unresponsive, currently communicative, but remains unsafe to swallow, on 11/20 2 AM patient was febrile, chest x-ray significant for pneumonia, bacterial, aspiration for which she is started on antibiotics.  Subjective:  No significant events overnight as discussed with staff, he has been tolerating dysphagia diet.  Patient denies any complaints.  Assessment & Plan:   Acute encephalopathy in the setting of Parkinson's disease and dementia.   -Patient was recently admitted with similar complaints had extensive work-up including MRI of the brain showing an old infarct, echo, EEG, carotid ultrasound were all nonacute.  A1c was normal & LDL was 90.  He was started on statin and aspirin.  This admission CT of the head stable UA normal.  Repeat MRI, short and long-term EEG nonacute.  -Neurology input greatly appreciated, mental status changes and fluctuations appear to be natural progression of his illness related to Parkinson disease and dementia, no improvement of mentation despite being getting his Parkinson medicine regularly through his NG tube,  discussed at length with wife by phone, and daughter at bedside today, informs me he never wanted feeding tube in the past, palliative medicine consulted regarding goals of care, he was seen by hospice , patient is not a candidate for residential hospice, at this point family do not think they can take care of him at home with hospice and they do request facility with palliative on board.  He has been started on dysphagia 3 diet with liquids, he has been tolerating some oral intake, so we will continue his fluids and feeding by tube, and see how he will do with oral intake over next 24 hours.  Aspiration pneumonia this admission.  -  Finished treatment with cefepime and Flagyl on 05/21/2020.  history of stroke continue aspirin and statin via NG tube.  history of CKD stage III with AKI in the setting of decreased p.o. intake.  Improved  with IV fluids.  His baseline creatinine is 1.5.    Parkinson's disease with dementia followed by Dr. Karel Jarvis  Hypokalemia/hypophosphatemia- repleted and resolved.   Bilateral upper extremity edema Doppler shows no evidence of DVT.  COVID-19 infection patient received monoclonal antibody 05/04/2020.  He also finished 10 days of isolation.     Nutrition Problem: Inadequate oral intake Etiology: lethargy/confusion, inability to eat  Interventions: Tube feeding, Prostat  Estimated body mass index is 28.33 kg/m as calculated from the following:   Height as of this encounter: 5\' 8"  (1.727 m).   Weight as of this encounter: 84.5 kg.  DVT prophylaxis: Lovenox  Code Status: DNR Family Communication:  Discussed with son at bedside  Disposition Plan:  Status is: Inpatient   Dispo: The patient is from: SNF              Anticipated d/c is to: Hospcie  vs SNF with palliaitive              Anticipated d/c date is: 1 to 2 days              Patient currently is not medically stable to d/c.   Consultants:  Neurology saw her on 05/12/2020 Procedures:  None Antimicrobials:  Anti-infectives (From admission, onward)   Start     Dose/Rate Route Frequency Ordered Stop   05/15/20 0815  ceFEPIme (MAXIPIME) 2 g in sodium chloride 0.9 % 100 mL IVPB  Status:  Discontinued        2 g 200 mL/hr over 30 Minutes Intravenous Every 12 hours 05/15/20 0725 05/21/20 1036   05/15/20 0800  metroNIDAZOLE (FLAGYL) 50 mg/ml oral suspension 500 mg  Status:  Discontinued        500 mg Per Tube 3 times daily 05/15/20 0708 05/21/20 1036         Objective:  Vitals:   05/27/20 2000 05/28/20 0400 05/28/20 0817 05/28/20 1213  BP: (!) 167/83 (!) 135/52  (!) 158/70  Pulse: 69 66  (!) 56  Resp: 18 17  11   Temp: 98.5 F (36.9 C) 98 F (36.7 C)  97.7 F (36.5 C)  TempSrc: Oral Axillary  Oral  SpO2: 95% 99% 98% 100%  Weight:  84.5 kg    Height:        Intake/Output Summary (Last 24 hours) at 05/28/2020 1313 Last data filed at 05/28/2020 1010 Gross per 24 hour  Intake 1168 ml  Output 1975 ml  Net -807 ml   Filed Weights   05/25/20 0401 05/27/20 0336 05/28/20 0400  Weight: 82.8 kg 83.5 kg 84.5 kg    Examination:     SHEENT is awake, oriented, pleasant, mildly diminished in cognition and insight but follow commands and answering most questions appropriately  Fair entry bilaterally with no wheezing or rhonchi  Regular rate and rhythm, no rubs or gallops . Abdomen soft, nontender, nondistended  Extremities with no edema, clubbing or cyanosis   Data Reviewed: I have personally reviewed following labs and imaging studies  CBC: Recent Labs  Lab 05/22/20 0215 05/23/20 0645 05/24/20 0439 05/25/20 0207  WBC 5.9 5.7 4.5 5.2  NEUTROABS 3.0 3.6 2.1 2.6  HGB 9.0* 9.4* 8.6* 9.3*  HCT 26.8* 27.7* 25.5* 26.8*  MCV 90.2 87.4 89.5 88.7  PLT 245 287 239 269   Basic Metabolic Panel: Recent Labs  Lab 05/22/20 0215 05/23/20 0645 05/24/20 0439 05/25/20 0207  NA 135 134* 138 137  K 3.8 3.6 3.6 3.6  CL 103 100 103 104  CO2 26 27 28 26   GLUCOSE 98  144* 146* 125*  BUN 38* 36* 37* 41*  CREATININE 1.24 1.09 1.15 1.15  CALCIUM 8.2* 8.3* 8.3* 8.5*  MG 1.9 2.0 1.9 1.9   GFR: Estimated Creatinine Clearance: 54.2 mL/min (by C-G formula based on SCr of 1.15 mg/dL). Liver Function Tests: Recent Labs  Lab 05/22/20 0215 05/23/20 0645 05/24/20 0439 05/25/20 0207  AST 21 25 17 16   ALT 8 19 12 15   ALKPHOS 36* 48 39 53  BILITOT 0.4 0.5 0.5 0.3  PROT 4.6* 5.2* 4.7* 5.0*  ALBUMIN 1.9* 2.2* 2.1* 2.3*   No results for input(s): LIPASE, AMYLASE in the last 168 hours. No results for input(s): AMMONIA in the last 168 hours. Coagulation Profile: No results for input(s): INR, PROTIME in the last 168 hours. Cardiac Enzymes: No results for input(s): CKTOTAL, CKMB, CKMBINDEX, TROPONINI in the last  168 hours. BNP (last 3 results) No results for input(s): PROBNP in the last 8760 hours. HbA1C: No results for input(s): HGBA1C in the last 72 hours. CBG: Recent Labs  Lab 05/27/20 2018 05/28/20 0006 05/28/20 0441 05/28/20 0753 05/28/20 1217  GLUCAP 98 107* 107* 132* 106*   Lipid Profile: No results for input(s): CHOL, HDL, LDLCALC, TRIG, CHOLHDL, LDLDIRECT in the last 72 hours. Thyroid Function Tests: No results for input(s): TSH, T4TOTAL, FREET4, T3FREE, THYROIDAB in the last 72 hours. Anemia Panel: No results for input(s): VITAMINB12, FOLATE, FERRITIN, TIBC, IRON, RETICCTPCT in the last 72 hours. Sepsis Labs: Recent Labs  Lab 05/22/20 0215 05/23/20 0645 05/24/20 0439 05/25/20 0207  PROCALCITON <0.10 <0.10 <0.10 <0.10    No results found for this or any previous visit (from the past 240 hour(s)).       Radiology Studies: No results found.  Scheduled Meds: . aspirin  81 mg Per Tube Daily  . carbidopa-levodopa  1 tablet Per Tube TID WC  . carvedilol  6.25 mg Per Tube BID WC  . cholecalciferol  1,000 Units Per Tube Daily  . enoxaparin (LOVENOX) injection  40 mg Subcutaneous Daily  . feeding supplement  237 mL Oral BID BM   . feeding supplement (PROSource TF)  90 mL Per Tube BID  . pantoprazole sodium  40 mg Per Tube BID  . sodium chloride flush  3 mL Intravenous Q12H  . vitamin B-12  1,000 mcg Per Tube Daily   Continuous Infusions:    LOS: 18 days     Huey Bienenstock, MD  05/28/2020, 1:13 PM

## 2020-05-28 NOTE — Progress Notes (Signed)
Patient resting in bed near end of shift. Patient continues with tube feed infusing per order.  No complaints of pain or distress throughout shift. No BM noted, 1200 u/o. Patient does eat well by mouth. Patient is alert to person but disoriented x3. Patient is cooperative and does follow commands. Plan is for possible SNIF Monday. Safety measures remain in place. Will continue to monitor and SBARR to day shift nurse.

## 2020-05-29 LAB — GLUCOSE, CAPILLARY
Glucose-Capillary: 100 mg/dL — ABNORMAL HIGH (ref 70–99)
Glucose-Capillary: 108 mg/dL — ABNORMAL HIGH (ref 70–99)
Glucose-Capillary: 109 mg/dL — ABNORMAL HIGH (ref 70–99)
Glucose-Capillary: 94 mg/dL (ref 70–99)

## 2020-05-29 LAB — BASIC METABOLIC PANEL WITH GFR
Anion gap: 7 (ref 5–15)
BUN: 25 mg/dL — ABNORMAL HIGH (ref 8–23)
CO2: 29 mmol/L (ref 22–32)
Calcium: 8.6 mg/dL — ABNORMAL LOW (ref 8.9–10.3)
Chloride: 102 mmol/L (ref 98–111)
Creatinine, Ser: 1.03 mg/dL (ref 0.61–1.24)
GFR, Estimated: >60 mL/min
Glucose, Bld: 105 mg/dL — ABNORMAL HIGH (ref 70–99)
Potassium: 3.6 mmol/L (ref 3.5–5.1)
Sodium: 138 mmol/L (ref 135–145)

## 2020-05-29 NOTE — Plan of Care (Signed)

## 2020-05-29 NOTE — TOC Progression Note (Signed)
Transition of Care [facility]) - Progression Note    Patient Details  Name: [name] MRN: [id number] Date of Birth: [birth year]  Transition of Care [facility]) CM/SW Contact  Mearl Latin, LCSW Phone Number: 05/29/2020, 11:12 AM  Clinical Narrative:    CSW still awaiting SNF bed offers. NG tube to be removed prior to discharge.      Barriers to Discharge: Continued Medical Work up  Expected Discharge Plan and Services   In-house Referral: Clinical Social Work                                             Social Determinants of Health (SDOH) Interventions    Readmission Risk Interventions No flowsheet data found.

## 2020-05-29 NOTE — Progress Notes (Signed)
Civil engineer, contracting [facility]) Hospital Liaison note.    Continue to follow this patient as a pending referral for outpatient palliative care support after discharge.  Thank you for the opportunity to participate in this patient's care. Chrislyn Brooke Dare, BSN, RN [facility] Liaison (listed on AMION under Hospice/[facility])    [phone]

## 2020-05-29 NOTE — Care Management Important Message (Signed)
Important Message  Patient Details  Name: [name] MRN: [id number] Date of Birth: [birth year]   Medicare Important Message Given:  Yes - Important Message mailed due to current National Emergency  Verbal consent obtained due to current National Emergency  Relationship to patient: Child Contact Name: Terrel Nesheiwat Call Date: 05/29/20  Time: 1501 Phone: [phone] Outcome: Spoke with contact Important Message mailed to: Patient address on file    [name] 05/29/2020, 3:04 PM

## 2020-05-29 NOTE — Progress Notes (Signed)
  Speech Language Pathology Treatment: Dysphagia  Patient Details Name: [name] MRN: [id number] DOB: 03/18/1941 Today's Date: 05/29/2020 Time: 6381-7711 SLP Time Calculation (min) (ACUTE ONLY): 13 min  Assessment / Plan / Recommendation Clinical Impression  Pt's alertness and cognition is significantly improved; daughter at bedside. Dentures donned and masticating regular texture adequately. No s/s aspiration with sips thin. Daughter stated RN reported pt is eating most of trays. Upgraded texture to regular with pt and dtr in agreement. Pills with thin. No further ST needed.    HPI HPI: 79 y.o. male with a history of Parkinson's disease, dementia, increasing falls at home, HTN, covid-19 vaccinations, and BPH who presented to the ED from home after 2 episodes of loss of consciousness. He has had altered mental status since arrival, so history is limited.age-indeterminate infarct of the posterior left cerebellum was noted      SLP Plan  All goals met;Discharge SLP treatment due to (comment)       Recommendations  Diet recommendations: Regular;Thin liquid Liquids provided via: Straw;Cup Medication Administration: Whole meds with liquid Supervision: Patient able to self feed;Intermittent supervision to cue for compensatory strategies Compensations: Slow rate;Small sips/bites;Other (Comment);Follow solids with liquid Postural Changes and/or Swallow Maneuvers: Seated upright 90 degrees;Upright 30-60 min after meal                Oral Care Recommendations: Oral care BID Follow up Recommendations:  (TBD) SLP Visit Diagnosis: Dysphagia, unspecified (R13.10) Plan: All goals met;Discharge SLP treatment due to (comment)       GO                Houston Siren 05/29/2020, 3:27 PM

## 2020-05-29 NOTE — Progress Notes (Signed)
PROGRESS NOTE    [name]  RCV:[id number] DOB: Oct 23, 1940 DOA: 05/10/2020 PCP: [name], MD   Brief Narrative   - 79 y.o.malewith medical history significant ofBPH, venous insufficiency, CKD, Parkinson's dementia, hypertension, falls, neutropenia, thrombocytopenia, recent admission(from 11/10-11/16 due to altered mental status, findings of old CVA, syncope, incidental Covid infection)who presents from his rehab facility with altered mental status, appears to be having fluctuating mental status, neurology work-up this admission is as significant including MRI, neurology were consulted, and they reported this is expected fluctuation if his progressive Parkinson disease and dementia, and recommendation is for him to continue his Parkinson meds, so core track tube was inserted, mentation has significantly improved, initially obtunded and unresponsive, currently communicative, but remains unsafe to swallow, on 11/20 2 AM patient was febrile, chest x-ray significant for pneumonia, bacterial, aspiration for which she is started on antibiotics.  Subjective:  No significant events overnight as discussed with staff, patient himself denies any complaints, patient with good appetite .   Assessment & Plan:   Acute encephalopathy in the setting of Parkinson's disease and dementia.   -Patient was recently admitted with similar complaints had extensive work-up including MRI of the brain showing an old infarct, echo, EEG, carotid ultrasound were all nonacute.  A1c was normal & LDL was 90.  He was started on statin and aspirin.  This admission CT of the head stable UA normal.  Repeat MRI, short and long-term EEG nonacute.  -Neurology input greatly appreciated, mental status changes and fluctuations appear to be natural progression of his illness related to Parkinson disease and dementia. -I had multiple discussions with multiple family members, informed them patient will have fluctuating  mental status, but overall natural course of Parkinson disease and dementia it is of progressive type, and he will keep having worsening mental status over time with increased episodes of encephalopathy. -He did require core track cube initially to give him his Parkinson medications, and for tube feeds, he has currently improved, where his mentation back to baseline, able to take his medication with good oral intake, will discontinue core track tube. -Palliative medicine input greatly appreciated, changes DNR, plan to discharge to facility, with close palliative follow-up, as well palliative to fill MOST form , patient never wanted to be on PEG tube, or artificial life support, and they do expect him to have another episodes of encephalopathy, for which by the end they would not want to pursue any aggressive care.  Aspiration pneumonia this admission.  -  Finished treatment with cefepime and Flagyl on 05/21/2020.  history of stroke continue aspirin and statin via NG tube.  history of CKD stage III with AKI in the setting of decreased p.o. intake.  Improved  with IV fluids.  His baseline creatinine is 1.5.    Parkinson's disease with dementia followed by Dr. Karel Jarvis  Hypokalemia/hypophosphatemia- repleted and resolved.   Bilateral upper extremity edema Doppler shows no evidence of DVT.  COVID-19 infection patient received monoclonal antibody 05/04/2020.  He also finished 10 days of isolation.     Nutrition Problem: Inadequate oral intake Etiology: lethargy/confusion, inability to eat  Interventions: Tube feeding, Prostat  Estimated body mass index is 28.33 kg/m as calculated from the following:   Height as of this encounter: 5\' 8"  (1.727 m).   Weight as of this encounter: 84.5 kg.  DVT prophylaxis: Lovenox  Code Status: DNR Family Communication:  Discussed with son at bedside  Disposition Plan:  Status is: Inpatient   Dispo: The  patient is from: SNF              Anticipated d/c  is to: SNF with palliaitive              Anticipated d/c date is: 1  days              Patient currently is medically stable to d/c.  Can be discharged when bed is available, Child psychotherapist following closely for bed placement.   Consultants:  Neurology saw her on 05/12/2020 Procedures: None Antimicrobials:  Anti-infectives (From admission, onward)   Start     Dose/Rate Route Frequency Ordered Stop   05/15/20 0815  ceFEPIme (MAXIPIME) 2 g in sodium chloride 0.9 % 100 mL IVPB  Status:  Discontinued        2 g 200 mL/hr over 30 Minutes Intravenous Every 12 hours 05/15/20 0725 05/21/20 1036   05/15/20 0800  metroNIDAZOLE (FLAGYL) 50 mg/ml oral suspension 500 mg  Status:  Discontinued        500 mg Per Tube 3 times daily 05/15/20 0708 05/21/20 1036         Objective:  Vitals:   05/29/20 0000 05/29/20 0400 05/29/20 0753 05/29/20 1202  BP: 119/64 (!) 160/84 (!) 157/74 123/60  Pulse: (!) 58 62 60 (!) 55  Resp: 15 16 15 14   Temp:  99 F (37.2 C) 97.6 F (36.4 C) 98 F (36.7 C)  TempSrc:  Oral Oral Oral  SpO2: 96% 99% 95% 97%  Weight:      Height:        Intake/Output Summary (Last 24 hours) at 05/29/2020 1432 Last data filed at 05/29/2020 1300 Gross per 24 hour  Intake 910 ml  Output 1350 ml  Net -440 ml   Filed Weights   05/25/20 0401 05/27/20 0336 05/28/20 0400  Weight: 82.8 kg 83.5 kg 84.5 kg    Examination:     Awake Alert,pleasent, no apparent distress, communicative, follow commands Symmetrical Chest wall movement, Good air movement bilaterally, CTAB RRR,No Gallops,Rubs or new Murmurs, No Parasternal Heave +ve B.Sounds, Abd Soft, No tenderness, No rebound - guarding or rigidity. No Cyanosis, Clubbing or edema, No new Rash or bruise      Data Reviewed: I have personally reviewed following labs and imaging studies  CBC: Recent Labs  Lab 05/23/20 0645 05/24/20 0439 05/25/20 0207  WBC 5.7 4.5 5.2  NEUTROABS 3.6 2.1 2.6  HGB 9.4* 8.6* 9.3*  HCT 27.7* 25.5*  26.8*  MCV 87.4 89.5 88.7  PLT 287 239 269   Basic Metabolic Panel: Recent Labs  Lab 05/23/20 0645 05/24/20 0439 05/25/20 0207 05/29/20 0731  NA 134* 138 137 138  K 3.6 3.6 3.6 3.6  CL 100 103 104 102  CO2 27 28 26 29   GLUCOSE 144* 146* 125* 105*  BUN 36* 37* 41* 25*  CREATININE 1.09 1.15 1.15 1.03  CALCIUM 8.3* 8.3* 8.5* 8.6*  MG 2.0 1.9 1.9  --    GFR: Estimated Creatinine Clearance: 60.5 mL/min (by C-G formula based on SCr of 1.03 mg/dL). Liver Function Tests: Recent Labs  Lab 05/23/20 0645 05/24/20 0439 05/25/20 0207  AST 25 17 16   ALT 19 12 15   ALKPHOS 48 39 53  BILITOT 0.5 0.5 0.3  PROT 5.2* 4.7* 5.0*  ALBUMIN 2.2* 2.1* 2.3*   No results for input(s): LIPASE, AMYLASE in the last 168 hours. No results for input(s): AMMONIA in the last 168 hours. Coagulation Profile: No results for input(s): INR, PROTIME  in the last 168 hours. Cardiac Enzymes: No results for input(s): CKTOTAL, CKMB, CKMBINDEX, TROPONINI in the last 168 hours. BNP (last 3 results) No results for input(s): PROBNP in the last 8760 hours. HbA1C: No results for input(s): HGBA1C in the last 72 hours. CBG: Recent Labs  Lab 05/28/20 2045 05/29/20 0017 05/29/20 0429 05/29/20 0745 05/29/20 1205  GLUCAP 98 100* 94 108* 109*   Lipid Profile: No results for input(s): CHOL, HDL, LDLCALC, TRIG, CHOLHDL, LDLDIRECT in the last 72 hours. Thyroid Function Tests: No results for input(s): TSH, T4TOTAL, FREET4, T3FREE, THYROIDAB in the last 72 hours. Anemia Panel: No results for input(s): VITAMINB12, FOLATE, FERRITIN, TIBC, IRON, RETICCTPCT in the last 72 hours. Sepsis Labs: Recent Labs  Lab 05/23/20 0645 05/24/20 0439 05/25/20 0207  PROCALCITON <0.10 <0.10 <0.10    No results found for this or any previous visit (from the past 240 hour(s)).       Radiology Studies: No results found.  Scheduled Meds: . aspirin  81 mg Per Tube Daily  . carbidopa-levodopa  1 tablet Per Tube TID WC  .  carvedilol  6.25 mg Per Tube BID WC  . cholecalciferol  1,000 Units Per Tube Daily  . enoxaparin (LOVENOX) injection  40 mg Subcutaneous Daily  . feeding supplement  237 mL Oral BID BM  . feeding supplement (PROSource TF)  90 mL Per Tube BID  . pantoprazole sodium  40 mg Per Tube BID  . sodium chloride flush  3 mL Intravenous Q12H  . vitamin B-12  1,000 mcg Per Tube Daily   Continuous Infusions:    LOS: 19 days     Huey Bienenstock, MD  05/29/2020, 2:32 PM

## 2020-05-30 MED ORDER — PROSOURCE PLUS PO LIQD
30.0000 mL | Freq: Two times a day (BID) | ORAL | Status: DC
  Administered 2020-05-31: 30 mL via ORAL
  Filled 2020-05-30 (×3): qty 30

## 2020-05-30 MED ORDER — PANTOPRAZOLE SODIUM 40 MG PO PACK
40.0000 mg | PACK | Freq: Two times a day (BID) | ORAL | Status: DC
  Administered 2020-05-30 – 2020-05-31 (×2): 40 mg via ORAL
  Filled 2020-05-30: qty 20

## 2020-05-30 NOTE — Progress Notes (Signed)
Nutrition Follow-up  DOCUMENTATION CODES:   Not applicable  INTERVENTION:  Provide Ensure Enlive po BID, each supplement provides 350 kcal and 20 grams of protein  Provide 30 ml Prosource Plus po BID, each supplement provides 100 kcal and 15 grams of protein.   Encourage adequate PO intake.   NUTRITION DIAGNOSIS:   Inadequate oral intake related to lethargy/confusion, inability to eat as evidenced by NPO status; diet advanced; progressing  GOAL:   Patient will meet greater than or equal to 90% of their needs; progressing  MONITOR:   PO intake, Supplement acceptance, Skin, Weight trends, Labs, I & O's  REASON FOR ASSESSMENT:   Rounds    ASSESSMENT:   79 y.o. male with medical history significant of BPH, venous insufficiency, CKD, Parkinson's dementia, hypertension, falls, neutropenia, thrombocytopenia, recent admission (from 11/10-11/16 due to altered mental status, findings of old CVA, syncope, incidental Covid infection) who presents from his rehab facility with altered mental status. Pt admitted with acute encephalopathy, Parkinson's dementia, and incidental finding of COVID-19 infection.  Diet has been advanced to a regular diet with thin liquids. Mentation and po has improved. Meal completion has been 50% recently. Cortrak removed yesterday. Tube feeding orders discontinued. Pt currently has Ensure ordered and has been consuming them. RD to additionally order Prosource plus to aid in protein needs.   Labs and medications reviewed.   Diet Order:   Diet Order            Diet regular Room service appropriate? Yes with Assist; Fluid consistency: Thin  Diet effective now                 EDUCATION NEEDS:   No education needs have been identified at this time  Skin:  Skin Assessment: Reviewed RN Assessment  Last BM:  12/6  Height:   Ht Readings from Last 1 Encounters:  05/12/20 5\' 8"  (1.727 m)    Weight:   Wt Readings from Last 1 Encounters:  05/30/20  83.8 kg   BMI:  Body mass index is 28.09 kg/m.  Estimated Nutritional Needs:   Kcal:  2000-2200  Protein:  100-115 grams  Fluid:  > 2 L  14/07/21, MS, RD, LDN RD pager number/after hours weekend pager number on Amion.

## 2020-05-30 NOTE — Progress Notes (Signed)
PROGRESS NOTE    [name]  ZGY:[id number] DOB: [birth year] DOA: 05/10/2020 PCP: [name], MD   Brief Narrative   - 79 y.o.malewith medical history significant ofBPH, venous insufficiency, CKD, Parkinson's dementia, hypertension, falls, neutropenia, thrombocytopenia, recent admission(from 11/10-11/16 due to altered mental status, findings of old CVA, syncope, incidental Covid infection)who presents from his rehab facility with altered mental status, appears to be having fluctuating mental status, neurology work-up this admission is as significant including MRI, neurology were consulted, and they reported this is expected fluctuation if his progressive Parkinson disease and dementia, and recommendation is for him to continue his Parkinson meds, so core track tube was inserted, mentation has significantly improved, initially obtunded and unresponsive, currently communicative, pleasant and appropriate, he did pass swallow evaluation, he is off tube feeds for last 3 days, palliative medicine has been consulted as his condition is expected to be progressive with significant fluctuation in mental status.   Subjective:  Difficult events overnight as discussed with staff, patient himself denies any complaints.     Assessment & Plan:   Acute encephalopathy in the setting of Parkinson's disease and dementia.   -Patient was recently admitted with similar complaints had extensive work-up including MRI of the brain showing an old infarct, echo, EEG, carotid ultrasound were all nonacute.  A1c was normal & LDL was 90.  He was started on statin and aspirin.  This admission CT of the head stable UA normal.  Repeat MRI, short and long-term EEG nonacute.  -Neurology input greatly appreciated, mental status changes and fluctuations appear to be natural progression of his illness related to Parkinson disease and dementia. -I had multiple discussions with multiple family members, informed them  patient will have fluctuating mental status, but overall natural course of Parkinson disease and dementia it is of progressive type, and he will keep having worsening mental status over time with increased episodes of encephalopathy. -He did require core track cube initially to give him his Parkinson medications, and for tube feeds, he has currently improved, where his mentation back to baseline, able to take his medication with good oral intake, will discontinue core track tube. -Palliative medicine input greatly appreciated, changes DNR, plan to discharge to facility, with close palliative follow-up, as well palliative to fill MOST form , patient never wanted to be on PEG tube, or artificial life support, and they do expect him to have another episodes of encephalopathy, for which by the end they would not want to pursue any aggressive care.  Aspiration pneumonia this admission.  -  Finished treatment with cefepime and Flagyl on 05/21/2020.  history of stroke continue aspirin and statin via NG tube.  history of CKD stage III with AKI in the setting of decreased p.o. intake.  Improved  with IV fluids.  His baseline creatinine is 1.5.    Parkinson's disease with dementia followed by Dr. Karel Jarvis  Hypokalemia/hypophosphatemia- repleted and resolved.   Bilateral upper extremity edema Doppler shows no evidence of DVT.  COVID-19 infection patient received monoclonal antibody 05/04/2020.  He also finished 10 days of isolation.     Nutrition Problem: Inadequate oral intake Etiology: lethargy/confusion, inability to eat  Interventions: Ensure Enlive (each supplement provides 350kcal and 20 grams of protein), Prostat  Estimated body mass index is 28.09 kg/m as calculated from the following:   Height as of this encounter: 5\' 8"  (1.727 m).   Weight as of this encounter: 83.8 kg.  DVT prophylaxis: Lovenox  Code Status: DNR Family Communication:  Discussed with sister at bedside 12/6, no family  available at bedside today.  Disposition Plan:  Status is: Inpatient   Dispo: The patient is from: SNF              Anticipated d/c is to: SNF with palliaitive              Anticipated d/c date is: 1  days              Patient currently is medically stable to d/c.  Can be discharged when bed is available, Child psychotherapist following closely for bed placement.   Consultants:  Neurology saw her on 05/12/2020 Procedures: None Antimicrobials:  Anti-infectives (From admission, onward)   Start     Dose/Rate Route Frequency Ordered Stop   05/15/20 0815  ceFEPIme (MAXIPIME) 2 g in sodium chloride 0.9 % 100 mL IVPB  Status:  Discontinued        2 g 200 mL/hr over 30 Minutes Intravenous Every 12 hours 05/15/20 0725 05/21/20 1036   05/15/20 0800  metroNIDAZOLE (FLAGYL) 50 mg/ml oral suspension 500 mg  Status:  Discontinued        500 mg Per Tube 3 times daily 05/15/20 0708 05/21/20 1036         Objective:  Vitals:   05/30/20 0000 05/30/20 0400 05/30/20 0500 05/30/20 1553  BP: (!) 112/100 (!) 184/80  (!) 152/71  Pulse: 61 65  60  Resp: 18 15  18   Temp: 98 F (36.7 C) 98 F (36.7 C)  (!) 97.5 F (36.4 C)  TempSrc: Axillary Oral  Oral  SpO2: 98% 100%  99%  Weight:   83.8 kg   Height:        Intake/Output Summary (Last 24 hours) at 05/30/2020 1554 Last data filed at 05/29/2020 1900 Gross per 24 hour  Intake -  Output 500 ml  Net -500 ml   Filed Weights   05/27/20 0336 05/28/20 0400 05/30/20 0500  Weight: 83.5 kg 84.5 kg 83.8 kg    Examination:     Awake, alert, pleasant, follow commands, communicative  Symmetrical Chest wall movement, Good air movement bilaterally, CTAB RRR,No Gallops,Rubs or new Murmurs, No Parasternal Heave +ve B.Sounds, Abd Soft, No tenderness, No rebound - guarding or rigidity. No Cyanosis, Clubbing or edema, No new Rash or bruise       Data Reviewed: I have personally reviewed following labs and imaging studies  CBC: Recent Labs  Lab 05/24/20  0439 05/25/20 0207  WBC 4.5 5.2  NEUTROABS 2.1 2.6  HGB 8.6* 9.3*  HCT 25.5* 26.8*  MCV 89.5 88.7  PLT 239 269   Basic Metabolic Panel: Recent Labs  Lab 05/24/20 0439 05/25/20 0207 05/29/20 0731  NA 138 137 138  K 3.6 3.6 3.6  CL 103 104 102  CO2 28 26 29   GLUCOSE 146* 125* 105*  BUN 37* 41* 25*  CREATININE 1.15 1.15 1.03  CALCIUM 8.3* 8.5* 8.6*  MG 1.9 1.9  --    GFR: Estimated Creatinine Clearance: 60.4 mL/min (by C-G formula based on SCr of 1.03 mg/dL). Liver Function Tests: Recent Labs  Lab 05/24/20 0439 05/25/20 0207  AST 17 16  ALT 12 15  ALKPHOS 39 53  BILITOT 0.5 0.3  PROT 4.7* 5.0*  ALBUMIN 2.1* 2.3*   No results for input(s): LIPASE, AMYLASE in the last 168 hours. No results for input(s): AMMONIA in the last 168 hours. Coagulation Profile: No results for input(s): INR, PROTIME in the last 168  hours. Cardiac Enzymes: No results for input(s): CKTOTAL, CKMB, CKMBINDEX, TROPONINI in the last 168 hours. BNP (last 3 results) No results for input(s): PROBNP in the last 8760 hours. HbA1C: No results for input(s): HGBA1C in the last 72 hours. CBG: Recent Labs  Lab 05/28/20 2045 05/29/20 0017 05/29/20 0429 05/29/20 0745 05/29/20 1205  GLUCAP 98 100* 94 108* 109*   Lipid Profile: No results for input(s): CHOL, HDL, LDLCALC, TRIG, CHOLHDL, LDLDIRECT in the last 72 hours. Thyroid Function Tests: No results for input(s): TSH, T4TOTAL, FREET4, T3FREE, THYROIDAB in the last 72 hours. Anemia Panel: No results for input(s): VITAMINB12, FOLATE, FERRITIN, TIBC, IRON, RETICCTPCT in the last 72 hours. Sepsis Labs: Recent Labs  Lab 05/24/20 0439 05/25/20 0207  PROCALCITON <0.10 <0.10    No results found for this or any previous visit (from the past 240 hour(s)).       Radiology Studies: No results found.  Scheduled Meds: . (feeding supplement) PROSource Plus  30 mL Oral BID BM  . aspirin  81 mg Per Tube Daily  . carbidopa-levodopa  1 tablet Per  Tube TID WC  . carvedilol  6.25 mg Per Tube BID WC  . cholecalciferol  1,000 Units Per Tube Daily  . enoxaparin (LOVENOX) injection  40 mg Subcutaneous Daily  . feeding supplement  237 mL Oral BID BM  . pantoprazole sodium  40 mg Per Tube BID  . sodium chloride flush  3 mL Intravenous Q12H  . vitamin B-12  1,000 mcg Per Tube Daily   Continuous Infusions:    LOS: 20 days     Huey Bienenstock, MD  05/30/2020, 3:54 PM

## 2020-05-30 NOTE — Progress Notes (Signed)
Physical Therapy Treatment Patient Details Name: [name] MRN: [id number] DOB: 04/17/1941 Today's Date: 05/30/2020    History of Present Illness 79yo male with recent admit 11/10 to 11/16 due to AMS/syncope/incidental Covid, now returning to the ED from his SNF with AMS and non-responsiveness. Admitted with acute encephalopathy and parkinsons dementia in the context of Covid. 11/26 noted bil UE weeping with blistersPMH HLD, HTN, parasomnia, CKD, parkinsons dementia, falls    PT Comments    Patient much more alert and able to participate and follow commands (with slow processing/some delay). Able to maintain his sitting balance with close-guarding for up to 30 seconds, however with incr fatigue his posterior lean increased. He did very well with lateral scooting along EOB (clearing his hips by ~6" off mattress) however he was afraid to attempt standing with single person "I don't want to hurt you." Returned to bed and placed in chair position per RN request as he is about to be transferred to another room.     Follow Up Recommendations  SNF;Supervision/Assistance - 24 hour     Equipment Recommendations  Wheelchair (measurements PT);Wheelchair cushion (measurements PT);Other (comment) (hoyer lift)    Recommendations for Other Services       Precautions / Restrictions Precautions Precautions: Fall    Mobility  Bed Mobility Overal bed mobility: Needs Assistance Bed Mobility: Sit to Supine;Rolling;Sidelying to Sit Rolling: Min assist (with rail) Sidelying to sit: Max assist (with rail)   Sit to supine: Mod assist   General bed mobility comments: increased cuing for sequencing, requires assist for initiation,  Transfers Overall transfer level: Needs assistance   Transfers: Lateral/Scoot Transfers          Lateral/Scoot Transfers: Mod assist General transfer comment: pt resisting attempting standing because "i don't want to hurt you"; Was able to laterally scoot (clearing  buttocks ~6" off mattress) x 8 scoots to his right  Ambulation/Gait                 Stairs             Wheelchair Mobility    Modified Rankin (Stroke Patients Only) Modified Rankin (Stroke Patients Only) Pre-Morbid Rankin Score: Moderate disability Modified Rankin: Severe disability     Balance Overall balance assessment: Needs assistance Sitting-balance support: Feet supported Sitting balance-Leahy Scale: Poor   Postural control: Posterior lean                                  Cognition Arousal/Alertness: Awake/alert (once awakened; sleeping on arrival) Behavior During Therapy: WFL for tasks assessed/performed Overall Cognitive Status: Impaired/Different from baseline Area of Impairment: Following commands;Problem solving;Awareness;Orientation                 Orientation Level: Disoriented to;Time;Situation;Place Current Attention Level: Sustained   Following Commands: Follows one step commands with increased time   Awareness: Intellectual Problem Solving: Slow processing;Decreased initiation;Requires verbal cues;Requires tactile cues;Difficulty sequencing General Comments: re-oriented to location, month, situation, however pt could not recall information after 1 minute      Exercises Other Exercises Other Exercises: seated forward reaching with pt ultimately able to reach down to his socks, however when cued to return to upright he overshoots with posterior lean    General Comments General comments (skin integrity, edema, etc.): VSS, including BP increasing from supine to sitting      Pertinent Vitals/Pain Pain Assessment: Faces Faces Pain Scale: No hurt  Home Living                      Prior Function            PT Goals (current goals can now be found in the care plan section) Acute Rehab PT Goals Patient Stated Goal: ride his [address] [employer]  Time For Goal Achievement: 05/26/20 Potential to Achieve Goals:  Fair Progress towards PT goals: Progressing toward goals    Frequency    Min 2X/week      PT Plan Current plan remains appropriate    Co-evaluation              AM-PAC PT "6 Clicks" Mobility   Outcome Measure  Help needed turning from your back to your side while in a flat bed without using bedrails?: A Little Help needed moving from lying on your back to sitting on the side of a flat bed without using bedrails?: A Lot Help needed moving to and from a bed to a chair (including a wheelchair)?: Total Help needed standing up from a chair using your arms (e.g., wheelchair or bedside chair)?: Total Help needed to walk in hospital room?: Total Help needed climbing 3-5 steps with a railing? : Total 6 Click Score: 9    End of Session   Activity Tolerance: Patient tolerated treatment well Patient left: in bed;with call bell/phone within reach;with bed alarm set (in chair position) Nurse Communication: Mobility status PT Visit Diagnosis: Other abnormalities of gait and mobility (R26.89);Difficulty in walking, not elsewhere classified (R26.2);Muscle weakness (generalized) (M62.81);History of falling (Z91.81)     Time: 1610-9604 PT Time Calculation (min) (ACUTE ONLY): 29 min  Charges:  $Therapeutic Activity: 23-37 mins                      [name], PT Pager [phone]    Zena Amos 05/30/2020, 12:51 PM

## 2020-05-30 NOTE — TOC Progression Note (Addendum)
Transition of Care [facility]) - Progression Note    Patient Details  Name: [name] MRN: [id number] Date of Birth: Sep 08, 1940  Transition of Care [facility]) CM/SW [place], LCSW Phone Number: 05/30/2020, 3:14 PM  Clinical Narrative:    1pm-CSW contacted the following facilities: -[facility]: not accepting patients -[facility]: no beds available -[facility]: No beds available -[facility]: unable to accept patient -[facility]: No beds available -[facility]: Unable to accept patients -[facility]: unable to accept patient -[facility]: not accepting patients  [facility] is able to accept patient. Left voicemail for patient's spouse.  4pm-CSW met with patient's spouse to provide [facility] bed offer. She reported understanding and requested that patient go to [name] if a bed becomes open by tomorrow. CSW has reached out to [facility] to see. Hydrographic surveyor faxed to [employer].    Expected Discharge Plan: Skilled Nursing Facility Barriers to Discharge: SNF Pending bed offer, Insurance Authorization  Expected Discharge Plan and Services Expected Discharge Plan: [place] In-house Referral: Clinical Social Work   Post Acute Care Choice: [place]                                         Social Determinants of Health (SDOH) Interventions    Readmission Risk Interventions No flowsheet data found.

## 2020-05-30 NOTE — Progress Notes (Signed)
Daily Progress Note   Patient Name: [name]       Date: 05/30/2020 DOB: [birth year]  Age: 79 y.o. MRN#: [id number] Attending Physician: Albertine Patricia, MD Primary Care Physician: Cassandria Anger, MD Admit Date: 05/10/2020  Reason for Consultation/Follow-up:  To discuss complex medical decision making related to patient's goals of care  Subjective: Met patient at bedside with his wife.  He is very pleasant, alert, orientated and with clear speech. I walked his wife (40+ years married) thru a MOST form. Patient is DNR. She elected limited interventions.  At this point she wants him to return to the hospital if he is ill. She elected antibiotics if needed and IV fluids if needed, but absolutely NO PEG tube ever.  [name] and I had a discussion about dementia and the stair step decline.  We stepped thru where Jaedyn's dementia likely is at this point using the FAST scale.  Per [name] he has elements of stage 5 to 6.  (moderate to advanced).  She understands that eventually he will stop eating and drinking well and will not be able to sustain himself.  At that point he will be nearing end of life.  MOST forms are meant to be reviewed and updated as a patient's situation / condition changes.  As Keshon's dementia progresses [name] will update the MOST form.    She would like for Palliative Care to follow him at the nursing facility.   Assessment: Moderately advanced dementia.  Currently doing well - able to eat and drink and preparing for discharge.   Patient Profile/HPI:  79 y.o. male  with past medical history of Parkinson's dementia, hypertension, CKD, BPH, venous insufficiency, falls, and thrombocytopenia. He was recently admitted 11/10-11/16 with AMS and syncope; was found  to have an old CVA and incidental Covid infection. He was discharged to SNF for rehab. He then presented to the emergency department on 05/10/2020 with AMS. His mental status has fluctuated throughout the hospitalization. He has had extensive neurology work-up this admission including MRI and EEG, all with non-acute findings. Per neurology, this is expected fluctuation for progressive Parkinson's dementia, and made recommendation for him to continue Parkinson meds so cortrak was placed. On 11/20, patient was febrile and chest x-ray showed aspiration pneumonia.    Length of Stay: 20  Vital Signs: BP (!) 184/80 (BP Location: Left Leg)   Pulse 65   Temp 98 F (36.7 C) (Oral)   Resp 15   Ht $R'5\' 8"'xi$  (1.727 m)   Wt 83.8 kg   SpO2 100%   BMI 28.09 kg/m  SpO2: SpO2: 100 % O2 Device: O2 Device: Room Air O2 Flow Rate:         Palliative Assessment/Data: 50%     Palliative Care Plan    Recommendations/Plan: MOST completed.  DNR, Limited interventions, No PEG ever. Palliative Care to follow at SNF.  Code Status:  DNR  Prognosis:  Unable to determine   Discharge Planning: [facility] for rehab with Palliative care service follow-up  Care plan was discussed with Dr. Waldron Labs, patient and wife.  Thank you for allowing the Palliative Medicine Team to assist in the care of this patient.  Total time spent:  60 min.     Greater than 50%  of this time was spent counseling and coordinating care related to the above assessment and plan.  Florentina Jenny, PA-C Palliative Medicine  Please contact Palliative MedicineTeam phone at [phone] for questions and concerns between 7 am - 7 pm.   Please see AMION for individual provider pager numbers.

## 2020-05-30 NOTE — Progress Notes (Signed)
OT Cancellation   05/30/20 1231  OT Visit Information  Last OT Received On 05/30/20  Reason Eval/Treat Not Completed Other (comment) (Pt in process of being transported to another room)  Luisa Dago, OT/L   Acute OT Clinical Specialist Acute Rehabilitation Services Pager [phone] Office [phone]

## 2020-05-31 DIAGNOSIS — I872 Venous insufficiency (chronic) (peripheral): Secondary | ICD-10-CM

## 2020-05-31 LAB — CREATININE, SERUM
Creatinine, Ser: 1.11 mg/dL (ref 0.61–1.24)
GFR, Estimated: >60 mL/min (ref >60)

## 2020-05-31 MED ORDER — ALUM & MAG HYDROXIDE-SIMETH 200-200-20 MG/5ML PO SUSP: 30.0000 mL | Freq: Four times a day (QID) | ORAL | Status: DC | PRN

## 2020-05-31 MED ORDER — CARBIDOPA-LEVODOPA 25-100 MG PO TABS
1.0000 | ORAL_TABLET | Freq: Three times a day (TID) | ORAL | Status: DC
  Administered 2020-05-31 (×3): 1 via ORAL
  Filled 2020-05-31 (×3): qty 1

## 2020-05-31 MED ORDER — CARVEDILOL 6.25 MG PO TABS
6.2500 mg | ORAL_TABLET | Freq: Two times a day (BID) | ORAL | Status: DC
  Administered 2020-05-31 (×2): 6.25 mg via ORAL
  Filled 2020-05-31 (×2): qty 1

## 2020-05-31 MED ORDER — ENSURE ENLIVE PO LIQD
237.0000 mL | Freq: Two times a day (BID) | ORAL | 12 refills | Status: DC
Start: 2020-05-31 — End: 2020-06-12

## 2020-05-31 MED ORDER — VITAMIN D 25 MCG (1000 UNIT) PO TABS
1000.0000 [IU] | ORAL_TABLET | Freq: Every day | ORAL | Status: DC
  Administered 2020-05-31: 1000 [IU] via ORAL
  Filled 2020-05-31: qty 1

## 2020-05-31 MED ORDER — VITAMIN B-12 1000 MCG PO TABS: 1000.0000 ug | ORAL_TABLET | Freq: Every day | ORAL | Status: DC

## 2020-05-31 MED ORDER — ASPIRIN 81 MG PO CHEW
81.0000 mg | CHEWABLE_TABLET | Freq: Every day | ORAL | Status: DC
  Administered 2020-05-31: 81 mg via ORAL
  Filled 2020-05-31: qty 1

## 2020-05-31 MED ORDER — PROSOURCE PLUS PO LIQD
30.0000 mL | Freq: Two times a day (BID) | ORAL | Status: DC
Start: 2020-05-31 — End: 2020-06-12

## 2020-05-31 MED ORDER — ACETAMINOPHEN 160 MG/5ML PO SOLN: 650.0000 mg | Freq: Four times a day (QID) | ORAL | Status: DC | PRN

## 2020-05-31 MED ORDER — PANTOPRAZOLE SODIUM 40 MG PO PACK
40.0000 mg | PACK | Freq: Every day | ORAL | Status: DC
Start: 2020-05-31 — End: 2020-07-19

## 2020-05-31 NOTE — TOC Transition Note (Signed)
Transition of Care [facility]) - CM/SW Discharge Note   Patient Details  Name: [name] MRN: [id number] Date of Birth: Nov 28, 1940  [facility]) CM/SW Contact:  Mearl Latin, LCSW Phone Number: 05/31/2020, 12:31 PM   Clinical Narrative:    Patient will DC to: [facility] Anticipated DC date: 05/31/20 Family notified: Spouse, [name] Transport by: Dayna Barker   Per MD patient ready for DC to [facility]. RN to call report prior to discharge [phone] Room 1203p). RN, patient, patient's family, and facility notified of DC. Discharge Summary and FL2 sent to facility. DC packet on chart. Ambulance transport requested for patient.   CSW will sign off for now as social work intervention is no longer needed. Please consult [place] again if new needs arise.        Barriers to Discharge: Barriers Resolved   Patient Goals and CMS Choice   CMS Medicare.gov Compare Post Acute Care list provided to:: Patient Represenative (must comment) Choice offered to / list presented to : Spouse  Discharge Placement   Existing PASRR number confirmed : 05/31/20          Patient [facility] bed at: [facility] Patient to be transferred to facility by: PTAR Name of family member notified: Clide Cliff, spouse Patient and family notified of of transfer: 05/31/20  Discharge Plan and Services In-house Referral: Clinical Social Work   Post Acute Care Choice: Skilled Nursing Facility                               Social Determinants of Health (SDOH) Interventions     Readmission Risk Interventions No flowsheet data found.

## 2020-05-31 NOTE — Progress Notes (Signed)
Called report to Dara at [facility] facility. Family informed, patient awaiting PTAR.

## 2020-05-31 NOTE — TOC Progression Note (Addendum)
Transition of Care [facility]) - Progression Note    Patient Details  Name: [name] MRN: [id number] Date of Birth: 1941-02-29  Transition of Care [facility]) CM/SW Contact  Mearl Latin, LCSW Phone Number: 05/31/2020, 9:38 AM  Clinical Narrative:    9:38am-[facility] has a bed available for patient. CSW updated Navi of facility choice; auth still pending.   11:44am-CSW received insurance approval for [facility]: [id number], effective 12/8-12/10. Wife updated. Will arrange PTAR.    Expected Discharge Plan: Skilled Nursing Facility Barriers to Discharge: SNF Pending bed offer, Insurance Authorization  Expected Discharge Plan and Services Expected Discharge Plan: Skilled Nursing Facility In-house Referral: Clinical Social Work   Post Acute Care Choice: Skilled Nursing Facility                                         Social Determinants of Health (SDOH) Interventions    Readmission Risk Interventions No flowsheet data found.

## 2020-05-31 NOTE — Progress Notes (Signed)
Patient discharged at 1900 pm, picked up by PTAR.

## 2020-05-31 NOTE — Discharge Summary (Signed)
PATIENT DETAILS Name: [name] Age: 79 y.o. Sex: male Date of Birth: [birth year] MRN: [id number]. Admitting Physician: Synetta Fail, MD WUJ:[id number], Georgina Quint, MD  Admit Date: 05/10/2020 Discharge date: 05/31/2020  Recommendations for Outpatient Follow-up:  1. Follow up with PCP in 1-2 weeks 2. Please obtain CMP/CBC in one week 3. Repeat Chest Xray in 4-6 week 4. Please ensure follow-up with palliative care at SNF.  Admitted From:  SNF  Disposition: SNF   Home Health: No  Equipment/Devices: None  Discharge Condition: Stable  CODE STATUS: DNR  Diet recommendation:  Diet Order            Diet - low sodium heart healthy           Diet regular Room service appropriate? Yes with Assist; Fluid consistency: Thin  Diet effective now                  Brief Summary: See H&P, Labs, Consult and Test reports for all details in brief, patient is a 79 year old male with history of BPH, CKD stage IIIa, Parkinson's disease, dementia related to Parkinson's disease, COVID-19 infection-presented from SNF for waxing and waning mental status and severe failure to thrive syndrome.  See below for further details.  Brief Hospital Course: Acute metabolic encephalopathy-superimposed on Parkinson's related dementia along with failure to thrive syndrome: Had extensive work-up recently-neurology evaluation completed-he will continue to have mental status fluctuations-it is felt that this is related to natural progression of Parkinson's disease related dementia.  Palliative care follow closely-multiple discussions were also done by prior hospitalist MDs-patient is a DNR-he does not desire artificial feeding-plan is to discharge to SNF with palliative care follow-up.  If he were to significantly decline in the future or have severe acute metabolic encephalopathy-family would not want to pursue any aggressive care.  Aspiration pneumonia: Finished a course of antimicrobial  therapy.  History of CVA: Continue aspirin/statin.  AKI on CKD stage IIIa: AKI hemodynamically mediated-treated with supportive care/IV fluids-improved-creatinine close to baseline.  Parkinson's disease: Continue Sinemet-followed in the outpatient setting by neurology-Dr. Cira Servant.  Please ensure outpatient follow-up.  Bilateral upper extremity edema: Probably secondary to IVinfiltration-hypoalbuminemia-improved-Doppler without DVT.  COVID-19 Labs:  No results for input(s): DDIMER, FERRITIN, LDH, CRP in the last 72 hours.  Lab Results  Component Value Date   SARSCOV2NAA POSITIVE (A) 05/10/2020   SARSCOV2NAA POSITIVE (A) 05/03/2020   SARSCOV2NAA NEGATIVE 07/03/2019   SARSCOV2NAA NOT DETECTED 02/22/2019       Discharge Diagnoses:  Principal Problem:   Acute encephalopathy Active Problems:   Essential hypertension   Chronic venous insufficiency   Dementia due to Parkinson's disease without behavioral disturbance (HCC)   Parkinson's syndrome (HCC)   History of CVA (cerebrovascular accident)   Discharge Instructions:    Person Under Monitoring Name: [name]  Location: [address] [place] [address]   Infection Prevention Recommendations for Individuals Confirmed to have, or Being Evaluated for, 2019 Novel Coronavirus (COVID-19) Infection Who Receive Care at Home  Individuals who are confirmed to have, or are being evaluated for, COVID-19 should follow the prevention steps below until a healthcare provider or local or state health department says they can return to normal activities.  Stay home except to get medical care You should restrict activities outside your home, except for getting medical care. Do not go to work, school, or public areas, and do not use public transportation or taxis.  Call ahead before visiting your doctor Before your medical appointment,  call the healthcare provider and tell them that you have, or are being evaluated for, COVID-19  infection. This will help the healthcare provider's office take steps to keep other people from getting infected. Ask your healthcare provider to call the local or state health department.  Monitor your symptoms Seek prompt medical attention if your illness is worsening (e.g., difficulty breathing). Before going to your medical appointment, call the healthcare provider and tell them that you have, or are being evaluated for, COVID-19 infection. Ask your healthcare provider to call the local or state health department.  Wear a facemask You should wear a facemask that covers your nose and mouth when you are in the same room with other people and when you visit a healthcare provider. People who live with or visit you should also wear a facemask while they are in the same room with you.  Separate yourself from other people in your home As much as possible, you should stay in a different room from other people in your home. Also, you should use a separate bathroom, if available.  Avoid sharing household items You should not share dishes, drinking glasses, cups, eating utensils, towels, bedding, or other items with other people in your home. After using these items, you should wash them thoroughly with soap and water.  Cover your coughs and sneezes Cover your mouth and nose with a tissue when you cough or sneeze, or you can cough or sneeze into your sleeve. Throw used tissues in a lined trash can, and immediately wash your hands with soap and water for at least 20 seconds or use an alcohol-based hand rub.  Wash your [employer] your hands often and thoroughly with soap and water for at least 20 seconds. You can use an alcohol-based hand sanitizer if soap and water are not available and if your hands are not visibly dirty. Avoid touching your eyes, nose, and mouth with unwashed hands.   Prevention Steps for Caregivers and Household Members of Individuals Confirmed to have, or Being Evaluated  for, COVID-19 Infection Being Cared for in the Home  If you live with, or provide care at home for, a person confirmed to have, or being evaluated for, COVID-19 infection please follow these guidelines to prevent infection:  Follow healthcare provider's instructions Make sure that you understand and can help the patient follow any healthcare provider instructions for all care.  Provide for the patient's basic needs You should help the patient with basic needs in the home and provide support for getting groceries, prescriptions, and other personal needs.  Monitor the patient's symptoms If they are getting sicker, call his or her medical provider and tell them that the patient has, or is being evaluated for, COVID-19 infection. This will help the healthcare provider's office take steps to keep other people from getting infected. Ask the healthcare provider to call the local or state health department.  Limit the number of people who have contact with the patient  If possible, have only one caregiver for the patient.  Other household members should stay in another home or place of residence. If this is not possible, they should stay  in another room, or be separated from the patient as much as possible. Use a separate bathroom, if available.  Restrict visitors who do not have an essential need to be in the home.  Keep older adults, very young children, and other sick people away from the patient Keep older adults, very young children, and those who have  compromised immune systems or chronic health conditions away from the patient. This includes people with chronic heart, lung, or kidney conditions, diabetes, and cancer.  Ensure good ventilation Make sure that shared spaces in the home have good air flow, such as from an air conditioner or an opened window, weather permitting.  Wash your hands often  Wash your hands often and thoroughly with soap and water for at least 20 seconds. You  can use an alcohol based hand sanitizer if soap and water are not available and if your hands are not visibly dirty.  Avoid touching your eyes, nose, and mouth with unwashed hands.  Use disposable paper towels to dry your hands. If not available, use dedicated cloth towels and replace them when they become wet.  Wear a facemask and gloves  Wear a disposable facemask at all times in the room and gloves when you touch or have contact with the patient's blood, body fluids, and/or secretions or excretions, such as sweat, saliva, sputum, nasal mucus, vomit, urine, or feces.  Ensure the mask fits over your nose and mouth tightly, and do not touch it during use.  Throw out disposable facemasks and gloves after using them. Do not reuse.  Wash your hands immediately after removing your facemask and gloves.  If your personal clothing becomes contaminated, carefully remove clothing and launder. Wash your hands after handling contaminated clothing.  Place all used disposable facemasks, gloves, and other waste in a lined container before disposing them with other household waste.  Remove gloves and wash your hands immediately after handling these items.  Do not share dishes, glasses, or other household items with the patient  Avoid sharing household items. You should not share dishes, drinking glasses, cups, eating utensils, towels, bedding, or other items with a patient who is confirmed to have, or being evaluated for, COVID-19 infection.  After the person uses these items, you should wash them thoroughly with soap and water.  Wash laundry thoroughly  Immediately remove and wash clothes or bedding that have blood, body fluids, and/or secretions or excretions, such as sweat, saliva, sputum, nasal mucus, vomit, urine, or feces, on them.  Wear gloves when handling laundry from the patient.  Read and follow directions on labels of laundry or clothing items and detergent. In general, wash and dry with  the warmest temperatures recommended on the label.  Clean all areas the individual has used often  Clean all touchable surfaces, such as counters, tabletops, doorknobs, bathroom fixtures, toilets, phones, keyboards, tablets, and bedside tables, every day. Also, clean any surfaces that may have blood, body fluids, and/or secretions or excretions on them.  Wear gloves when cleaning surfaces the patient has come in contact with.  Use a diluted bleach solution (e.g., dilute bleach with 1 part bleach and 10 parts water) or a household disinfectant with a label that says EPA-registered for coronaviruses. To make a bleach solution at home, add 1 tablespoon of bleach to 1 quart (4 cups) of water. For a larger supply, add  cup of bleach to 1 gallon (16 cups) of water.  Read labels of cleaning products and follow recommendations provided on product labels. Labels contain instructions for safe and effective use of the cleaning product including precautions you should take when applying the product, such as wearing gloves or eye protection and making sure you have good ventilation during use of the product.  Remove gloves and wash hands immediately after cleaning.  Monitor yourself for signs and symptoms of illness Caregivers  and household members are considered close contacts, should monitor their health, and will be asked to limit movement outside of the home to the extent possible. Follow the monitoring steps for close contacts listed on the symptom monitoring form.   ? If you have additional questions, contact your local health department or call the epidemiologist on call at [phone] (available 24/7). ? This guidance is subject to change. For the most up-to-date guidance from [facility], please refer to their website: [employer]    Activity:  As tolerated with Full fall precautions use walker/cane & assistance as needed  Discharge  Instructions    Diet - low sodium heart healthy   Complete by: As directed    Increase activity slowly   Complete by: As directed      Allergies as of 05/31/2020      Reactions   Amlodipine Other (See Comments)   Edema w/10 mg   Lorazepam Other (See Comments)   DO NOT GIVE BENZO TO [facility] WITH CONCERNS FOR LEWY BODY DEMENTIA, CAUSES PARADOXICAL WORSENING OF THEIR AGITATION!   Penicillins Hives   Has patient had a PCN reaction causing immediate rash, facial/tongue/throat swelling, SOB or lightheadedness with hypotension: Yes Has patient had a PCN reaction causing severe rash involving mucus membranes or skin necrosis: No Has patient had a PCN reaction that required hospitalization No Has patient had a PCN reaction occurring within the last 10 years: No If all of the above answers are "NO", then may proceed with Cephalosporin use.   Sulfonamide Derivatives Hives   Atorvastatin    REACTION: leg cramps   Sulfa Antibiotics Other (See Comments), Palpitations   Sweating, passed out   Telmisartan Other (See Comments)   ?fatigue      Medication List    STOP taking these medications   hydrALAZINE 100 MG tablet Commonly known as: APRESOLINE   loratadine 10 MG tablet Commonly known as: CLARITIN   rivastigmine 1.5 MG capsule Commonly known as: EXELON   torsemide 100 MG tablet Commonly known as: DEMADEX   traZODone 50 MG tablet Commonly known as: DESYREL   valACYclovir 1000 MG tablet Commonly known as: VALTREX   valACYclovir 500 MG tablet Commonly known as: VALTREX     TAKE these medications   (feeding supplement) PROSource Plus liquid Take 30 mLs by mouth 2 (two) times daily between meals.   feeding supplement Liqd Take 237 mLs by mouth 2 (two) times daily between meals.   aspirin 81 MG chewable tablet Chew 1 tablet (81 mg total) by mouth daily.   carbidopa-levodopa 25-100 MG tablet Commonly known as: SINEMET IR Take 1 tablet three times a day with meals What  changed:   how much to take  how to take this  when to take this  additional instructions   carvedilol 6.25 MG tablet Commonly known as: COREG Take 1 tablet (6.25 mg total) by mouth 2 (two) times daily with a meal.   pantoprazole sodium 40 mg/20 mL Pack Commonly known as: PROTONIX Take 20 mLs (40 mg total) by mouth daily.   rosuvastatin 10 MG tablet Commonly known as: CRESTOR Take 1 tablet (10 mg total) by mouth daily.   SYRINGE-NEEDLE (DISP) 3 ML 25G X 1" 3 ML Misc Commonly known as: BD Eclipse Syringe Use sq as directed   vitamin B-12 1000 MCG tablet Commonly known as: CYANOCOBALAMIN Take 1,000 mcg by mouth daily. What changed: Another medication with the same name was removed. Continue taking this medication, and follow the directions  you see here.   Vitamin D 1000 units capsule Take 1 capsule (1,000 Units total) by mouth daily.       Follow-up Information    Plotnikov, Georgina Quint, MD. Schedule an appointment as soon as possible for a visit.   Specialty: Internal Medicine Contact information: [address] [place] [address] [phone]        Van Clines, MD. Schedule an appointment as soon as possible for a visit in 2 week(s).   Specialty: Neurology Contact information: [address] [place] [address] [phone]              Allergies  Allergen Reactions  . Amlodipine Other (See Comments)    Edema w/10 mg  . Lorazepam Other (See Comments)    DO NOT GIVE BENZO TO [facility] WITH CONCERNS FOR LEWY BODY DEMENTIA, CAUSES PARADOXICAL WORSENING OF THEIR AGITATION!  . Penicillins Hives    Has patient had a PCN reaction causing immediate rash, facial/tongue/throat swelling, SOB or lightheadedness with hypotension: Yes Has patient had a PCN reaction causing severe rash involving mucus membranes or skin necrosis: No Has patient had a PCN reaction that required hospitalization No Has patient had a PCN reaction occurring  within the last 10 years: No If all of the above answers are "NO", then may proceed with Cephalosporin use.   . Sulfonamide Derivatives Hives  . Atorvastatin     REACTION: leg cramps  . Sulfa Antibiotics Other (See Comments) and Palpitations    Sweating, passed out  . Telmisartan Other (See Comments)    ?fatigue      Consultations:  Neurology,  Palliative Care   Other Procedures/Studies: EEG  Result Date: 05/05/2020 Larry Quest, MD     05/05/2020  9:30 AM Patient Name: [name] MRN: [id number] Epilepsy Attending: Charlsie [name] Referring Physician/Provider: Dr Albertine Grates Date: 05/04/2020 Duration: 23.26 minutes Patient history: 79 year old male with episodes of syncope x2.  EEG to evaluate for seizures. Level of alertness: Awake, drowsy, sleep, comatose, lethargic AEDs during EEG study: None Technical aspects: This EEG study was done with scalp electrodes positioned according to the 10-20 International system of electrode placement. Electrical activity was acquired at a sampling rate of 500Hz  and reviewed with a high frequency filter of 70Hz  and a low frequency filter of 1Hz . EEG data were recorded continuously and digitally stored. Description: The posterior dominant rhythm consists of 8 Hz activity of moderate voltage (25-35 uV) seen predominantly in posterior head regions, symmetric and reactive to eye opening and eye closing. EEG showed continuous generalized 5 to 6 Hz theta as well as intermittent 2 to 3 Hz delta slowing. Hyperventilation and photic stimulation were not performed.   ABNORMALITY -Continuous slow, generalized IMPRESSION: This study is suggestive of mild diffuse encephalopathy, nonspecific etiology. No seizures or epileptiform discharges were seen throughout the recording. [name]   DG Chest 2 View  Result Date: 05/03/2020 CLINICAL DATA:  79 year old male with a history of syncope EXAM: CHEST - 2 VIEW COMPARISON:  07/03/2019 FINDINGS:  Cardiomediastinal silhouette likely unchanged in size and contour given the positioning. No pneumothorax. No pleural effusion. Coarsened interstitial markings bilateral lungs without confluent airspace disease. No acute displaced fracture. Degenerative changes of the visualized spine. IMPRESSION: Negative for acute cardiopulmonary disease Electronically Signed   By: Gilmer Mor D.O.   On: 05/03/2020 13:50   CT Head Wo Contrast  Result Date: 05/10/2020 CLINICAL DATA:  Mental status changes EXAM: CT  HEAD WITHOUT CONTRAST TECHNIQUE: Contiguous axial images were obtained from the base of the skull through the vertex without intravenous contrast. COMPARISON:  05/03/2020 FINDINGS: Brain: There is atrophy and chronic small vessel disease changes. No acute intracranial abnormality. Specifically, no hemorrhage, hydrocephalus, mass lesion, acute infarction, or significant intracranial injury. Vascular: No hyperdense vessel or unexpected calcification. Skull: No acute calvarial abnormality. Sinuses/Orbits: Visualized paranasal sinuses and mastoids clear. Orbital soft tissues unremarkable. Other: None IMPRESSION: Atrophy, chronic microvascular disease. No acute intracranial abnormality. Electronically Signed   By: Charlett Nose [name].D.   On: 05/10/2020 19:26   CT Head Wo Contrast  Result Date: 05/03/2020 CLINICAL DATA:  Mental status change, unknown cause. EXAM: CT HEAD WITHOUT CONTRAST TECHNIQUE: Contiguous axial images were obtained from the base of the skull through the vertex without intravenous contrast. COMPARISON:  Head CT 02/16/2019.  Brain MRI 11/23/2018. FINDINGS: Brain: Moderate cerebral atrophy. Moderate ill-defined hypoattenuation within the cerebral white matter is nonspecific, but compatible with chronic small vessel ischemic disease. A small infarct is questioned within the posterior left cerebellar hemisphere, new from the prior head CT of 02/16/2019 but otherwise age indeterminate (series 2, image 9)  (series 5, image 56). There is no acute intracranial hemorrhage. No demarcated cortical infarct. No extra-axial fluid collection. No evidence of intracranial mass. No midline shift. Vascular: No hyperdense vessel.  Atherosclerotic calcifications. Skull: Normal. Negative for fracture or focal lesion. Sinuses/Orbits: Visualized orbits show no acute finding. Mild ethmoid and maxillary sinus mucosal thickening. IMPRESSION: A small infarct is questioned within the posterior left cerebellar hemisphere, new from the prior head CT of 02/16/2019 but otherwise age indeterminate. Moderate cerebral atrophy and chronic small vessel ischemic disease. Mild ethmoid and maxillary sinus mucosal thickening. Electronically Signed   By: Jackey Loge DO   On: 05/03/2020 19:26   MR ANGIO HEAD WO CONTRAST  Result Date: 05/06/2020 CLINICAL DATA:  Posterior cerebellar infarction, age indeterminate. EXAM: MRI HEAD WITHOUT CONTRAST MRA HEAD WITHOUT CONTRAST MRA NECK WITHOUT CONTRAST TECHNIQUE: Multiplanar, multiecho pulse sequences of the brain and surrounding structures were obtained without intravenous contrast. Angiographic images of the Circle of Willis were obtained using MRA technique without intravenous contrast. Angiographic images of the neck were obtained using MRA technique without intravenous contrast. Carotid stenosis measurements (when applicable) are obtained utilizing NASCET criteria, using the distal internal carotid diameter as the denominator. COMPARISON:  Head CT 05/03/2020 FINDINGS: MRI HEAD FINDINGS Brain: Diffusion imaging does not show any acute or subacute infarction. There are old small vessel infarctions within the left posterior cerebellum. Mild chronic small-vessel change affects the pons. Cerebral hemispheres show generalized atrophy, temporal lobe predominant. There are moderate chronic small-vessel ischemic changes of the deep white matter. No large vessel territory cerebral hemispheric infarction. No mass  lesion, hemorrhage, hydrocephalus or extra-axial collection. Vascular: Major vessels at the base of the brain show flow. Skull and upper cervical spine: Negative Sinuses/Orbits: Mild mucosal inflammatory changes of the sinuses. Orbits negative. Other: None MRA HEAD FINDINGS Both internal carotid arteries widely patent through the skull base and siphon regions. No siphon stenosis. The anterior and middle cerebral vessels are patent without proximal stenosis, aneurysm or vascular malformation. No large or medium vessel occlusion. Fenestrated left M1 segment. Both vertebral arteries are patent to the basilar. No basilar stenosis. Posterior circulation branch vessels appear normal. MRA NECK FINDINGS Limited resolution due to noncontrast technique. Both common carotid arteries show antegrade flow to the bifurcation regions. On the right, there is no apparent stenosis. On the left, there  is too much artifact to evaluate the bifurcation region. Antegrade flow is present in both vertebral arteries. IMPRESSION: No acute intracranial finding. 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 Moderate chronic small-vessel ischemic changes of the cerebral hemispheric deep white matter. Generalized atrophy, temporal lobe predominant. 1. MRA head: No large or medium vessel occlusion or correctable proximal stenosis. 2. MRA neck: Allowing for noncontrast technique, there is no visible carotid bifurcation stenosis on the right. Left carotid bifurcation cannot be seen because of artifact. Antegrade flow is present in both vertebral arteries. Electronically Signed   By: Paulina Fusi [name].D.   On: 05/06/2020 13:56   MR ANGIO NECK WO CONTRAST  Result Date: 05/06/2020 CLINICAL DATA:  Posterior cerebellar infarction, age indeterminate. EXAM: MRI HEAD WITHOUT CONTRAST MRA HEAD WITHOUT CONTRAST MRA NECK WITHOUT CONTRAST TECHNIQUE: Multiplanar, multiecho pulse sequences of the brain and surrounding structures were obtained without  intravenous contrast. Angiographic images of the Circle of Willis were obtained using MRA technique without intravenous contrast. Angiographic images of the neck were obtained using MRA technique without intravenous contrast. Carotid stenosis measurements (when applicable) are obtained utilizing NASCET criteria, using the distal internal carotid diameter as the denominator. COMPARISON:  Head CT 05/03/2020 FINDINGS: MRI HEAD FINDINGS Brain: Diffusion imaging does not show any acute or subacute infarction. There are old small vessel infarctions within the left posterior cerebellum. Mild chronic small-vessel change affects the pons. Cerebral hemispheres show generalized atrophy, temporal lobe predominant. There are moderate chronic small-vessel ischemic changes of the deep white matter. No large vessel territory cerebral hemispheric infarction. No mass lesion, hemorrhage, hydrocephalus or extra-axial collection. Vascular: Major vessels at the base of the brain show flow. Skull and upper cervical spine: Negative Sinuses/Orbits: Mild mucosal inflammatory changes of the sinuses. Orbits negative. Other: None MRA HEAD FINDINGS Both internal carotid arteries widely patent through the skull base and siphon regions. No siphon stenosis. The anterior and middle cerebral vessels are patent without proximal stenosis, aneurysm or vascular malformation. No large or medium vessel occlusion. Fenestrated left M1 segment. Both vertebral arteries are patent to the basilar. No basilar stenosis. Posterior circulation branch vessels appear normal. MRA NECK FINDINGS Limited resolution due to noncontrast technique. Both common carotid arteries show antegrade flow to the bifurcation regions. On the right, there is no apparent stenosis. On the left, there is too much artifact to evaluate the bifurcation region. Antegrade flow is present in both vertebral arteries. IMPRESSION: No acute intracranial finding. 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 Moderate chronic small-vessel ischemic changes of the cerebral hemispheric deep white matter. Generalized atrophy, temporal lobe predominant. 1. MRA head: No large or medium vessel occlusion or correctable proximal stenosis. 2. MRA neck: Allowing for noncontrast technique, there is no visible carotid bifurcation stenosis on the right. Left carotid bifurcation cannot be seen because of artifact. Antegrade flow is present in both vertebral arteries. Electronically Signed   By: Paulina Fusi [name].D.   On: 05/06/2020 13:56   MR BRAIN WO CONTRAST  Result Date: 05/21/2020 CLINICAL DATA:  Delirium. EXAM: MRI HEAD WITHOUT CONTRAST TECHNIQUE: Multiplanar, multiecho pulse sequences of the brain and surrounding structures were obtained without intravenous contrast. COMPARISON:  05/11/2020 FINDINGS: Brain: There is no evidence of an acute infarct, mass, midline shift, or extra-axial fluid collection. There may be a single chronic microhemorrhage in the right cerebellum. Patchy and early confluent T2 hyperintensities in the cerebral white matter are unchanged and nonspecific but compatible with moderate chronic small vessel ischemic disease. Mild chronic  small vessel changes are present in the pons. Ventricular enlargement is unchanged and favored to be secondary to cerebral atrophy rather than hydrocephalus with note again made of severe bilateral mesial temporal lobe atrophy. Vascular: Major intracranial vascular flow voids are preserved. Skull and upper cervical spine: Unremarkable bone marrow signal. Sinuses/Orbits: Right cataract extraction. Mild mucosal thickening in the paranasal sinuses. Trace right mastoid effusion. Other: None. IMPRESSION: 1. No acute intracranial abnormality. 2. Moderate chronic small vessel ischemic disease and temporal lobe predominant cerebral atrophy. Electronically Signed   By: Sebastian Ache [name].D.   On: 05/21/2020 18:03   MR BRAIN WO CONTRAST  Result Date:  05/12/2020 CLINICAL DATA:  Delirium. EXAM: MRI HEAD WITHOUT CONTRAST TECHNIQUE: Multiplanar, multiecho pulse sequences of the brain and surrounding structures were obtained without intravenous contrast. COMPARISON:  Head CT May 10, 2020; MRI of the brain November 13, 20. FINDINGS: Brain: No acute infarction, hemorrhage, extra-axial collection or mass lesion. Scattered and confluent foci of T2 hyperintensity are seen within the white matter of the cerebral hemispheres. Prominence of the supratentorial ventricles and sylvian fissures with relative preservation of the high convexity sulci, may be seen the setting normal pressure hydrocephalus in the appropriate clinical scenario. Prominent volume loss of the mesial temporal lobes. No significant change from prior MRI. Vascular: Normal flow voids. Skull and upper cervical spine: Normal marrow signal. Sinuses/Orbits: Mucosal thickening the ethmoid cells and sphenoid sinuses. Right lens surgery. Other: Mild bilateral mastoid effusion. IMPRESSION: 1. No acute intracranial abnormality. 2. Prominence of the supratentorial ventricles and Sylvian fissures with relative preservation of the high convexity sulci, may be seen the setting of normal pressure hydrocephalus in the appropriate clinical scenario. 3. Prominent volume loss of the mesial temporal lobes. 4. Moderate chronic small vessel ischemic changes. Electronically Signed   By: Baldemar Lenis [name].D.   On: 05/12/2020 00:02   MR BRAIN WO CONTRAST  Result Date: 05/06/2020 CLINICAL DATA:  Posterior cerebellar infarction, age indeterminate. EXAM: MRI HEAD WITHOUT CONTRAST MRA HEAD WITHOUT CONTRAST MRA NECK WITHOUT CONTRAST TECHNIQUE: Multiplanar, multiecho pulse sequences of the brain and surrounding structures were obtained without intravenous contrast. Angiographic images of the Circle of Willis were obtained using MRA technique without intravenous contrast. Angiographic images of the neck were  obtained using MRA technique without intravenous contrast. Carotid stenosis measurements (when applicable) are obtained utilizing NASCET criteria, using the distal internal carotid diameter as the denominator. COMPARISON:  Head CT 05/03/2020 FINDINGS: MRI HEAD FINDINGS Brain: Diffusion imaging does not show any acute or subacute infarction. There are old small vessel infarctions within the left posterior cerebellum. Mild chronic small-vessel change affects the pons. Cerebral hemispheres show generalized atrophy, temporal lobe predominant. There are moderate chronic small-vessel ischemic changes of the deep white matter. No large vessel territory cerebral hemispheric infarction. No mass lesion, hemorrhage, hydrocephalus or extra-axial collection. Vascular: Major vessels at the base of the brain show flow. Skull and upper cervical spine: Negative Sinuses/Orbits: Mild mucosal inflammatory changes of the sinuses. Orbits negative. Other: None MRA HEAD FINDINGS Both internal carotid arteries widely patent through the skull base and siphon regions. No siphon stenosis. The anterior and middle cerebral vessels are patent without proximal stenosis, aneurysm or vascular malformation. No large or medium vessel occlusion. Fenestrated left M1 segment. Both vertebral arteries are patent to the basilar. No basilar stenosis. Posterior circulation branch vessels appear normal. MRA NECK FINDINGS Limited resolution due to noncontrast technique. Both common carotid arteries show antegrade flow to the bifurcation regions. On the  right, there is no apparent stenosis. On the left, there is too much artifact to evaluate the bifurcation region. Antegrade flow is present in both vertebral arteries. IMPRESSION: No acute intracranial finding. 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 Moderate chronic small-vessel ischemic changes of the cerebral hemispheric deep white matter. Generalized atrophy, temporal lobe predominant. 1. MRA  head: No large or medium vessel occlusion or correctable proximal stenosis. 2. MRA neck: Allowing for noncontrast technique, there is no visible carotid bifurcation stenosis on the right. Left carotid bifurcation cannot be seen because of artifact. Antegrade flow is present in both vertebral arteries. Electronically Signed   By: Paulina Fusi [name].D.   On: 05/06/2020 13:56   DG Chest Port 1 View  Result Date: 05/21/2020 CLINICAL DATA:  Shortness of breath EXAM: PORTABLE CHEST 1 VIEW COMPARISON:  May 14, 2020. FINDINGS: Feeding tube tip is below the diaphragm, likely in the distal stomach or proximal duodenum region. No pneumothorax. Small pleural effusions noted bilaterally. No edema or airspace opacity appreciable. Heart is upper normal in size with pulmonary vascularity normal. No adenopathy. There is aortic atherosclerosis. No bone lesions. IMPRESSION: Feeding tube as described. Small layering pleural effusions bilaterally. No edema or airspace opacity. Heart upper normal in size. Aortic Atherosclerosis (ICD10-I70.0). Electronically Signed   By: Bretta Bang III [name].D.   On: 05/21/2020 08:41   DG CHEST PORT 1 VIEW  Result Date: 05/14/2020 CLINICAL DATA:  Respiratory acidosis EXAM: PORTABLE CHEST 1 VIEW COMPARISON:  05/11/2020 FINDINGS: Esophageal tube tip overlies the mid gastric region. Development of right greater than left hazy airspace infiltrates. No pleural effusion. Stable cardiomediastinal silhouette with aortic atherosclerosis. No pneumothorax. IMPRESSION: Development of right greater than left hazy basilar airspace infiltrates. Electronically Signed   By: Jasmine Pang [name].D.   On: 05/14/2020 23:53   DG CHEST PORT 1 VIEW  Result Date: 05/11/2020 CLINICAL DATA:  Altered mental status. EXAM: PORTABLE CHEST 1 VIEW COMPARISON:  05/03/2020. FINDINGS: Cardiomegaly. No pulmonary venous congestion. Low lung volumes. No focal infiltrate. No pleural effusion or pneumothorax. Degenerative change  thoracic spine. IMPRESSION: 1. Cardiomegaly. No pulmonary venous congestion. 2. Low lung volumes. No focal infiltrate. Electronically Signed   By: Maisie Fus  Register   On: 05/11/2020 07:30   DG Swallowing Func-Speech Pathology  Result Date: 05/17/2020 Objective Swallowing Evaluation: Type of Study: MBS-Modified Barium Swallow Study  Patient Details Name: [name] MRN: [id number] Date of Birth: 02/28/1941 Today's Date: 05/17/2020 Time: SLP Start Time (ACUTE ONLY): 1329 -SLP Stop Time (ACUTE ONLY): 1349 SLP Time Calculation (min) (ACUTE ONLY): 20 min Past Medical History: Past Medical History: Diagnosis Date . B12 deficiency 11/19/2018 . Benign prostatic hypertrophy  . Edema  . Hyperlipidemia  . Hypertension  . Insomnia  . Parasomnia 10/14/2011  PSG 12/15/11>>AHI 0.4, SpO2 low 89%, PLMI 16, somnoliloquay, nocturia  . Prediabetes 06/10/2012 . Seasonal allergies  . Sepsis (HCC) 01/2019 Past Surgical History: Past Surgical History: Procedure Laterality Date . CYSTOSCOPY/URETEROSCOPY/HOLMIUM LASER/STENT PLACEMENT Left 07/05/2019  Procedure: CYSTOSCOPY/URETEROSCOPY/STENT PLACEMENT/REROGRADE;  Surgeon: Crista Elliot, MD;  Location: [facility];  Service: Urology;  Laterality: Left; . FINGER SURGERY Right 04/19/2014  RIGHT INDEX & RING FINGER   FROM DOG BITE  . I & D EXTREMITY Right 04/20/2014  Procedure: IRRIGATION AND DEBRIDEMENT right index finger and right ring finger;  Surgeon: Dominica Severin, MD;  Location: [facility] OR;  Service: Orthopedics;  Laterality: Right; . I & D EXTREMITY Right 04/21/2014  Procedure: IRRIGATION AND DEBRIDEMENT RIGHT HAND RING FINGER,  INDEX FINGER AND Flexor Tenolysis and FDS Tenotomy;  Surgeon: Dominica Severin, MD;  Location: [facility] OR;  Service: Orthopedics;  Laterality: Right; HPI: 79 y.o. male with a history of Parkinson's disease, dementia, increasing falls at home, HTN, covid-19 vaccinations, and BPH who presented to the ED from home after 2 episodes of loss of consciousness. He has had altered  mental status since arrival, so history is limited.age-indeterminate infarct of the posterior left cerebellum was noted  No data recorded Assessment / Plan / Recommendation CHL IP CLINICAL IMPRESSIONS 05/17/2020 Clinical Impression Pt adequately awake for MBS however drowsy and required intermittent verbal/tactile stimulation. Oropharyngeal dysphagia was minimal without laryngeal penetration or aspiration observed. Orally, there was delayed propulsion with decreased cohesion, sublingual spill and lingual residue. Mild-mod vallecular residue with thin which reached pyriform sinuses with straw perhaps due to drowsiness. He did not have dentures donned and states they are in "all the time" therefore, puree (Dys 1) texutre recommended in addition to inconsistent alertness. Prognosis to return to regular is good. Pills whole in puree if alert.   SLP Visit Diagnosis Dysphagia, oropharyngeal phase (R13.12) Attention and concentration deficit following -- Frontal lobe and executive function deficit following -- Impact on safety and function --   CHL IP TREATMENT RECOMMENDATION 05/17/2020 Treatment Recommendations Therapy as outlined in treatment plan below   Prognosis 05/17/2020 Prognosis for Safe Diet Advancement Good Barriers to Reach Goals -- Barriers/Prognosis Comment -- CHL IP DIET RECOMMENDATION 05/17/2020 SLP Diet Recommendations Dysphagia 1 (Puree) solids;Thin liquid Liquid Administration via Cup;Straw Medication Administration Whole meds with puree Compensations Slow rate;Small sips/bites Postural Changes Seated upright at 90 degrees   CHL IP OTHER RECOMMENDATIONS 05/17/2020 Recommended Consults -- Oral Care Recommendations Oral care BID Other Recommendations --   CHL IP FOLLOW UP RECOMMENDATIONS 05/17/2020 Follow up Recommendations Skilled Nursing facility   [facility] IP FREQUENCY AND DURATION 05/17/2020 Speech Therapy Frequency (ACUTE ONLY) min 2x/week Treatment Duration 2 weeks      CHL IP ORAL PHASE 05/17/2020 Oral  Phase Impaired Oral - Pudding Teaspoon -- Oral - Pudding Cup -- Oral - Honey Teaspoon -- Oral - Honey Cup -- Oral - Nectar Teaspoon -- Oral - Nectar Cup Decreased bolus cohesion;Reduced posterior propulsion Oral - Nectar Straw -- Oral - Thin Teaspoon -- Oral - Thin Cup Decreased bolus cohesion Oral - Thin Straw -- Oral - Puree Decreased bolus cohesion;Reduced posterior propulsion Oral - Mech Soft -- Oral - Regular -- Oral - Multi-Consistency -- Oral - Pill -- Oral Phase - Comment --  CHL IP PHARYNGEAL PHASE 05/17/2020 Pharyngeal Phase Impaired Pharyngeal- Pudding Teaspoon -- Pharyngeal -- Pharyngeal- Pudding Cup -- Pharyngeal -- Pharyngeal- Honey Teaspoon -- Pharyngeal -- Pharyngeal- Honey Cup -- Pharyngeal -- Pharyngeal- Nectar Teaspoon -- Pharyngeal -- Pharyngeal- Nectar Cup Delayed swallow initiation-vallecula Pharyngeal -- Pharyngeal- Nectar Straw -- Pharyngeal -- Pharyngeal- Thin Teaspoon -- Pharyngeal -- Pharyngeal- Thin Cup WFL Pharyngeal -- Pharyngeal- Thin Straw Delayed swallow initiation-pyriform sinuses;Pharyngeal residue - valleculae;Delayed swallow initiation-vallecula Pharyngeal -- Pharyngeal- Puree WFL Pharyngeal -- Pharyngeal- Mechanical Soft -- Pharyngeal -- Pharyngeal- Regular -- Pharyngeal -- Pharyngeal- Multi-consistency -- Pharyngeal -- Pharyngeal- Pill -- Pharyngeal -- Pharyngeal Comment --  CHL IP CERVICAL ESOPHAGEAL PHASE 05/17/2020 Cervical Esophageal Phase WFL Pudding Teaspoon -- Pudding Cup -- Honey Teaspoon -- Honey Cup -- Nectar Teaspoon -- Nectar Cup -- Nectar Straw -- Thin Teaspoon -- Thin Cup -- Thin Straw -- Puree -- Mechanical Soft -- Regular -- Multi-consistency -- Pill -- Cervical Esophageal Comment -- [name] 05/17/2020, 3:28  PM [name] Litaker [name].Ed Sports administrator Pager [phone] Office [phone]              EEG adult  Result Date: 05/22/2020 Larry Quest, MD     05/22/2020 12:41 PM Patient Name: [name] MRN: [id number]  Epilepsy Attending: Charlsie [name] Referring Physician/Provider: Dr. Susa Raring Date: 05/22/2020 Duration: 23.52 minutes Patient history: 79 year old male with persistent encephalopathy in the setting of Parkinson's disease and dementia.  EEG to evaluate for seizures. Level of alertness: Awake AEDs during EEG study: None Technical aspects: This EEG study was done with scalp electrodes positioned according to the 10-20 International system of electrode placement. Electrical activity was acquired at a sampling rate of 500Hz  and reviewed with a high frequency filter of 70Hz  and a low frequency filter of 1Hz . EEG data were recorded continuously and digitally stored. Description: The posterior dominant rhythm consists of 8Hz  activity of moderate voltage (25-35 uV) seen predominantly in posterior head regions, symmetric and reactive to eye opening and eye closing. EEG showed continuous generalized 3 to 6 Hz theta-delta slowing. Hyperventilation and photic stimulation were not performed.   ABNORMALITY - Continuous slow, generalized IMPRESSION: This study is suggestive of mild diffuse encephalopathy, nonspecific etiology. No seizures or epileptiform discharges were seen throughout the recording. Priyanka Annabelle Harman   Overnight EEG with video  Result Date: 05/23/2020 Larry Quest, MD     05/23/2020  4:11 PM Patient Name: LATHYN GRIGGS MRN: [id number] Epilepsy Attending: Charlsie [name] Referring Physician/Provider: Dr. Onalee Hua Duration:  05/22/1020 1017 to 05/23/2020 1017  Patient history: 79 year old male with persistent encephalopathy in the setting of Parkinson's disease and dementia.  EEG to evaluate for seizures.  Level of alertness: Awake, asleep  AEDs during EEG study: None  Technical aspects: This EEG study was done with scalp electrodes positioned according to the 10-20 International system of electrode placement. Electrical activity was acquired at a sampling rate of 500Hz  and reviewed  with a high frequency filter of 70Hz  and a low frequency filter of 1Hz . EEG data were recorded continuously and digitally stored.  Description: The posterior dominant rhythm consists of 8Hz  activity of moderate voltage (25-35 uV) seen predominantly in posterior head regions, symmetric and reactive to eye opening and eye closing.  Sleep was characterized by vertex is currently 10 (12-14 at), maximal frontocentral region.  EEG showed continuous generalized 3 to 6 Hz theta-delta slowing. Hyperventilation and photic stimulation were not performed.   EEG showed electrode artifact after 2230 which made parts of study difficult to interpret.  ABNORMALITY - Continuous slow, generalized  IMPRESSION: This study is suggestive of mild diffuse encephalopathy, nonspecific etiology. No seizures or epileptiform discharges were seen throughout the recording.  Priyanka O Yadav   VAS US CAROTID  Result Date: 05/06/2020 Carotid Arterial Duplex Study Indications:       CVA. Risk Factors:      Hypertension. Limitations        Today's exam was limited due to the patient's respiratory                    variation. Comparison Study:  No prior studies. Performing Technologist: Chanda Busing RVT  Examination Guidelines: A complete evaluation includes B-mode imaging, spectral Doppler, color Doppler, and power Doppler as needed of all accessible portions of each vessel. Bilateral testing is considered an integral part of a complete examination. Limited examinations for reoccurring indications may be performed as noted.  Right Carotid Findings: +----------+--------+--------+--------+-----------------------+--------+  PSV cm/sEDV cm/sStenosisPlaque Description     Comments +----------+--------+--------+--------+-----------------------+--------+ CCA Prox  111     14              smooth and heterogenous         +----------+--------+--------+--------+-----------------------+--------+ CCA Distal78      14               smooth and heterogenous         +----------+--------+--------+--------+-----------------------+--------+ ICA Prox  97      16              smooth and heterogenous         +----------+--------+--------+--------+-----------------------+--------+ ICA Distal51      14                                     tortuous +----------+--------+--------+--------+-----------------------+--------+ ECA       224     26                                              +----------+--------+--------+--------+-----------------------+--------+ +----------+--------+-------+--------+-------------------+           PSV cm/sEDV cmsDescribeArm Pressure (mmHG) +----------+--------+-------+--------+-------------------+ [id number]                                        +----------+--------+-------+--------+-------------------+ +---------+--------+--+--------+--+---------+ VertebralPSV cm/s59EDV cm/s12Antegrade +---------+--------+--+--------+--+---------+  Left Carotid Findings: +----------+--------+--------+--------+-----------------------+--------+           PSV cm/sEDV cm/sStenosisPlaque Description     Comments +----------+--------+--------+--------+-----------------------+--------+ CCA Prox  107     14              smooth and heterogenous         +----------+--------+--------+--------+-----------------------+--------+ CCA Distal111     14              smooth and heterogenous         +----------+--------+--------+--------+-----------------------+--------+ ICA Prox  133     21              smooth and heterogenous         +----------+--------+--------+--------+-----------------------+--------+ ICA Distal88      24                                     tortuous +----------+--------+--------+--------+-----------------------+--------+ ECA       107     9                                                +----------+--------+--------+--------+-----------------------+--------+ +----------+--------+--------+--------+-------------------+           PSV cm/sEDV cm/sDescribeArm Pressure (mmHG) +----------+--------+--------+--------+-------------------+ Subclavian200                                         +----------+--------+--------+--------+-------------------+ +---------+--------+--+--------+--+---------+ VertebralPSV cm/s57EDV cm/s12Antegrade +---------+--------+--+--------+--+---------+   Summary: Right Carotid: Velocities in the right ICA are consistent with a 1-39% stenosis. Left Carotid: Velocities in the left ICA  are consistent with a 1-39% stenosis. Vertebrals: Bilateral vertebral arteries demonstrate antegrade flow. *See table(s) above for measurements and observations.  Electronically signed by Fabienne Bruns MD on 05/06/2020 at 11:49:43 AM.    Final    VAS [place] UPPER EXTREMITY VENOUS DUPLEX  Result Date: 05/16/2020 UPPER VENOUS STUDY  Indications: Edema Comparison Study: No prior studies. Performing Technologist: Jean Rosenthal  Examination Guidelines: A complete evaluation includes B-mode imaging, spectral Doppler, color Doppler, and power Doppler as needed of all accessible portions of each vessel. Bilateral testing is considered an integral part of a complete examination. Limited examinations for reoccurring indications may be performed as noted.  Right Findings: +----------+------------+---------+-----------+----------+-------+ RIGHT     CompressiblePhasicitySpontaneousPropertiesSummary +----------+------------+---------+-----------+----------+-------+ Subclavian    Full       Yes       Yes                      +----------+------------+---------+-----------+----------+-------+  Left Findings: +----------+------------+---------+-----------+----------+-------+ LEFT      CompressiblePhasicitySpontaneousPropertiesSummary  +----------+------------+---------+-----------+----------+-------+ IJV           Full       Yes       Yes                      +----------+------------+---------+-----------+----------+-------+ Subclavian    Full       Yes       Yes                      +----------+------------+---------+-----------+----------+-------+ Axillary      Full       Yes       Yes                      +----------+------------+---------+-----------+----------+-------+ Brachial      Full                                          +----------+------------+---------+-----------+----------+-------+ Radial        Full                                          +----------+------------+---------+-----------+----------+-------+ Ulnar         Full                                          +----------+------------+---------+-----------+----------+-------+ Cephalic      Full                                          +----------+------------+---------+-----------+----------+-------+ Basilic       Full                                          +----------+------------+---------+-----------+----------+-------+  Summary:  Right: No evidence of thrombosis in the subclavian.  Left: No evidence of deep vein thrombosis in the upper extremity. No evidence of superficial vein thrombosis in the upper extremity.  *See table(s) above for measurements and observations.  Diagnosing physician: Waverly Ferrari MD Electronically signed by Waverly Ferrari MD on 05/16/2020 at 5:58:51 PM.    Final    ECHOCARDIOGRAM LIMITED  Result Date: 05/04/2020    ECHOCARDIOGRAM LIMITED REPORT   Patient Name:   [name] Date of Exam: 05/04/2020 Medical Rec #:  [id number]      Height:       68.0 in Accession #:    [id number]     Weight:       170.9 lb Date of Birth:  12-13-1940       BSA:          1.911 [name] Patient Age:    79 years       BP:           180/84 mmHg Patient Gender: [name]              HR:           70 bpm. Exam  Location:  Inpatient Procedure: Limited Echo, Cardiac Doppler and Color Doppler Indications:    Syncope  History:        Patient has no prior history of Echocardiogram examinations.                 COPD, Signs/Symptoms:Syncope; Risk Factors:Hypertension,                 Dyslipidemia and Former Smoker. Parkinsons Dementia, COVID+.  Sonographer:    Lavenia Atlas Referring Phys: Albertine Grates  Sonographer Comments: Technically challenging study due to limited acoustic windows. Image acquisition challenging due to COPD. IMPRESSIONS  1. Left ventricular ejection fraction, by estimation, is 55 to 60%. The left ventricle has normal function. There is mild left ventricular hypertrophy. Left ventricular diastolic parameters are consistent with Grade I diastolic dysfunction (impaired relaxation).  2. Right ventricular systolic function is normal. The right ventricular size is normal. Tricuspid regurgitation signal is inadequate for assessing PA pressure.  3. Left atrial size was mildly dilated.  4. The mitral valve is normal in structure. No evidence of mitral valve regurgitation. No evidence of mitral stenosis.  5. The aortic valve was not well visualized. Aortic valve regurgitation is trivial. No aortic stenosis is present.  6. Technically difficult study with poor acoustic windows. FINDINGS  Left Ventricle: Left ventricular ejection fraction, by estimation, is 55 to 60%. The left ventricle has normal function. The left ventricular internal cavity size was normal in size. There is mild left ventricular hypertrophy. Left ventricular diastolic  parameters are consistent with Grade I diastolic dysfunction (impaired relaxation). Right Ventricle: The right ventricular size is normal. No increase in right ventricular wall thickness. Right ventricular systolic function is normal. Tricuspid regurgitation signal is inadequate for assessing PA pressure. Left Atrium: Left atrial size was mildly dilated. Right Atrium: Right atrial size  was normal in size. Mitral Valve: The mitral valve is normal in structure. No evidence of mitral valve stenosis. Tricuspid Valve: The tricuspid valve is normal in structure. Tricuspid valve regurgitation is not demonstrated. Aortic Valve: The aortic valve was not well visualized. Aortic valve regurgitation is trivial. No aortic stenosis is present. Pulmonic Valve: The pulmonic valve was not well visualized. Aorta: The aortic root is normal in size and structure. Venous: The inferior vena cava was not well visualized. IAS/Shunts: No atrial level shunt detected by color flow Doppler. LEFT VENTRICLE PLAX 2D LVIDd:         3.70 cm Diastology LVIDs:         2.60 cm LV  e' medial:    4.79 cm/s LV PW:         1.40 cm LV E/e' medial:  9.3 LV IVS:        1.20 cm LV e' lateral:   5.77 cm/s                        LV E/e' lateral: 7.7  RIGHT VENTRICLE RV S prime:     7.07 cm/s MITRAL VALVE MV Area (PHT): 2.22 cm MV Decel Time: 342 msec MV E velocity: 44.60 cm/s MV A velocity: 62.60 cm/s MV E/A ratio:  0.71 Marca Ancona MD Electronically signed by Marca Ancona MD Signature Date/Time: 05/04/2020/3:45:20 PM    Final      TODAY-DAY OF DISCHARGE:  Subjective:   [name] today has no headache,no chest abdominal pain,no new weakness tingling or numbness, feels much better wants to go home today.   Objective:   Blood pressure (!) 166/84, pulse 62, temperature 97.7 F (36.5 C), temperature source Oral, resp. rate 16, height 5\' 8"  (1.727 [name]), weight 78.7 kg, SpO2 98 %.  Intake/Output Summary (Last 24 hours) at 05/31/2020 1044 Last data filed at 05/31/2020 0017 Gross per 24 hour  Intake --  Output 800 ml  Net -800 ml   Filed Weights   05/28/20 0400 05/30/20 0500 05/31/20 0500  Weight: 84.5 kg 83.8 kg 78.7 kg    Exam: Awake Alert, Oriented *3, No new F.N deficits, Normal affect Maish Vaya.AT,PERRAL Supple Neck,No JVD, No cervical lymphadenopathy appriciated.  Symmetrical Chest wall movement, Good air movement  bilaterally, CTAB RRR,No Gallops,Rubs or new Murmurs, No Parasternal Heave +ve B.Sounds, Abd Soft, Non tender, No organomegaly appriciated, No rebound -guarding or rigidity. No Cyanosis, Clubbing or edema, No new Rash or bruise   PERTINENT RADIOLOGIC STUDIES: EEG  Result Date: 05/05/2020 Larry Quest, MD     05/05/2020  9:30 AM Patient Name: [name] MRN: [id number] Epilepsy Attending: Charlsie [name] Referring Physician/Provider: Dr Albertine Grates Date: 05/04/2020 Duration: 23.26 minutes Patient history: 79 year old male with episodes of syncope x2.  EEG to evaluate for seizures. Level of alertness: Awake, drowsy, sleep, comatose, lethargic AEDs during EEG study: None Technical aspects: This EEG study was done with scalp electrodes positioned according to the 10-20 International system of electrode placement. Electrical activity was acquired at a sampling rate of 500Hz  and reviewed with a high frequency filter of 70Hz  and a low frequency filter of 1Hz . EEG data were recorded continuously and digitally stored. Description: The posterior dominant rhythm consists of 8 Hz activity of moderate voltage (25-35 uV) seen predominantly in posterior head regions, symmetric and reactive to eye opening and eye closing. EEG showed continuous generalized 5 to 6 Hz theta as well as intermittent 2 to 3 Hz delta slowing. Hyperventilation and photic stimulation were not performed.   ABNORMALITY -Continuous slow, generalized IMPRESSION: This study is suggestive of mild diffuse encephalopathy, nonspecific etiology. No seizures or epileptiform discharges were seen throughout the recording. [name]   DG Chest 2 View  Result Date: 05/03/2020 CLINICAL DATA:  79 year old male with a history of syncope EXAM: CHEST - 2 VIEW COMPARISON:  07/03/2019 FINDINGS: Cardiomediastinal silhouette likely unchanged in size and contour given the positioning. No pneumothorax. No pleural effusion. Coarsened interstitial markings  bilateral lungs without confluent airspace disease. No acute displaced fracture. Degenerative changes of the visualized spine. IMPRESSION: Negative for acute cardiopulmonary disease Electronically Signed   By: Gilmer Mor D.O.  On: 05/03/2020 13:50   CT Head Wo Contrast  Result Date: 05/10/2020 CLINICAL DATA:  Mental status changes EXAM: CT HEAD WITHOUT CONTRAST TECHNIQUE: Contiguous axial images were obtained from the base of the skull through the vertex without intravenous contrast. COMPARISON:  05/03/2020 FINDINGS: Brain: There is atrophy and chronic small vessel disease changes. No acute intracranial abnormality. Specifically, no hemorrhage, hydrocephalus, mass lesion, acute infarction, or significant intracranial injury. Vascular: No hyperdense vessel or unexpected calcification. Skull: No acute calvarial abnormality. Sinuses/Orbits: Visualized paranasal sinuses and mastoids clear. Orbital soft tissues unremarkable. Other: None IMPRESSION: Atrophy, chronic microvascular disease. No acute intracranial abnormality. Electronically Signed   By: Charlett Nose [name].D.   On: 05/10/2020 19:26   CT Head Wo Contrast  Result Date: 05/03/2020 CLINICAL DATA:  Mental status change, unknown cause. EXAM: CT HEAD WITHOUT CONTRAST TECHNIQUE: Contiguous axial images were obtained from the base of the skull through the vertex without intravenous contrast. COMPARISON:  Head CT 02/16/2019.  Brain MRI 11/23/2018. FINDINGS: Brain: Moderate cerebral atrophy. Moderate ill-defined hypoattenuation within the cerebral white matter is nonspecific, but compatible with chronic small vessel ischemic disease. A small infarct is questioned within the posterior left cerebellar hemisphere, new from the prior head CT of 02/16/2019 but otherwise age indeterminate (series 2, image 9) (series 5, image 56). There is no acute intracranial hemorrhage. No demarcated cortical infarct. No extra-axial fluid collection. No evidence of intracranial  mass. No midline shift. Vascular: No hyperdense vessel.  Atherosclerotic calcifications. Skull: Normal. Negative for fracture or focal lesion. Sinuses/Orbits: Visualized orbits show no acute finding. Mild ethmoid and maxillary sinus mucosal thickening. IMPRESSION: A small infarct is questioned within the posterior left cerebellar hemisphere, new from the prior head CT of 02/16/2019 but otherwise age indeterminate. Moderate cerebral atrophy and chronic small vessel ischemic disease. Mild ethmoid and maxillary sinus mucosal thickening. Electronically Signed   By: Jackey Loge DO   On: 05/03/2020 19:26   MR ANGIO HEAD WO CONTRAST  Result Date: 05/06/2020 CLINICAL DATA:  Posterior cerebellar infarction, age indeterminate. EXAM: MRI HEAD WITHOUT CONTRAST MRA HEAD WITHOUT CONTRAST MRA NECK WITHOUT CONTRAST TECHNIQUE: Multiplanar, multiecho pulse sequences of the brain and surrounding structures were obtained without intravenous contrast. Angiographic images of the Circle of Willis were obtained using MRA technique without intravenous contrast. Angiographic images of the neck were obtained using MRA technique without intravenous contrast. Carotid stenosis measurements (when applicable) are obtained utilizing NASCET criteria, using the distal internal carotid diameter as the denominator. COMPARISON:  Head CT 05/03/2020 FINDINGS: MRI HEAD FINDINGS Brain: Diffusion imaging does not show any acute or subacute infarction. There are old small vessel infarctions within the left posterior cerebellum. Mild chronic small-vessel change affects the pons. Cerebral hemispheres show generalized atrophy, temporal lobe predominant. There are moderate chronic small-vessel ischemic changes of the deep white matter. No large vessel territory cerebral hemispheric infarction. No mass lesion, hemorrhage, hydrocephalus or extra-axial collection. Vascular: Major vessels at the base of the brain show flow. Skull and upper cervical spine:  Negative Sinuses/Orbits: Mild mucosal inflammatory changes of the sinuses. Orbits negative. Other: None MRA HEAD FINDINGS Both internal carotid arteries widely patent through the skull base and siphon regions. No siphon stenosis. The anterior and middle cerebral vessels are patent without proximal stenosis, aneurysm or vascular malformation. No large or medium vessel occlusion. Fenestrated left M1 segment. Both vertebral arteries are patent to the basilar. No basilar stenosis. Posterior circulation branch vessels appear normal. MRA NECK FINDINGS Limited resolution due to noncontrast technique. Both common  carotid arteries show antegrade flow to the bifurcation regions. On the right, there is no apparent stenosis. On the left, there is too much artifact to evaluate the bifurcation region. Antegrade flow is present in both vertebral arteries. IMPRESSION: No acute intracranial finding. 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 Moderate chronic small-vessel ischemic changes of the cerebral hemispheric deep white matter. Generalized atrophy, temporal lobe predominant. 1. MRA head: No large or medium vessel occlusion or correctable proximal stenosis. 2. MRA neck: Allowing for noncontrast technique, there is no visible carotid bifurcation stenosis on the right. Left carotid bifurcation cannot be seen because of artifact. Antegrade flow is present in both vertebral arteries. Electronically Signed   By: Paulina Fusi [name].D.   On: 05/06/2020 13:56   MR ANGIO NECK WO CONTRAST  Result Date: 05/06/2020 CLINICAL DATA:  Posterior cerebellar infarction, age indeterminate. EXAM: MRI HEAD WITHOUT CONTRAST MRA HEAD WITHOUT CONTRAST MRA NECK WITHOUT CONTRAST TECHNIQUE: Multiplanar, multiecho pulse sequences of the brain and surrounding structures were obtained without intravenous contrast. Angiographic images of the Circle of Willis were obtained using MRA technique without intravenous contrast. Angiographic images of  the neck were obtained using MRA technique without intravenous contrast. Carotid stenosis measurements (when applicable) are obtained utilizing NASCET criteria, using the distal internal carotid diameter as the denominator. COMPARISON:  Head CT 05/03/2020 FINDINGS: MRI HEAD FINDINGS Brain: Diffusion imaging does not show any acute or subacute infarction. There are old small vessel infarctions within the left posterior cerebellum. Mild chronic small-vessel change affects the pons. Cerebral hemispheres show generalized atrophy, temporal lobe predominant. There are moderate chronic small-vessel ischemic changes of the deep white matter. No large vessel territory cerebral hemispheric infarction. No mass lesion, hemorrhage, hydrocephalus or extra-axial collection. Vascular: Major vessels at the base of the brain show flow. Skull and upper cervical spine: Negative Sinuses/Orbits: Mild mucosal inflammatory changes of the sinuses. Orbits negative. Other: None MRA HEAD FINDINGS Both internal carotid arteries widely patent through the skull base and siphon regions. No siphon stenosis. The anterior and middle cerebral vessels are patent without proximal stenosis, aneurysm or vascular malformation. No large or medium vessel occlusion. Fenestrated left M1 segment. Both vertebral arteries are patent to the basilar. No basilar stenosis. Posterior circulation branch vessels appear normal. MRA NECK FINDINGS Limited resolution due to noncontrast technique. Both common carotid arteries show antegrade flow to the bifurcation regions. On the right, there is no apparent stenosis. On the left, there is too much artifact to evaluate the bifurcation region. Antegrade flow is present in both vertebral arteries. IMPRESSION: No acute intracranial finding. 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 Moderate chronic small-vessel ischemic changes of the cerebral hemispheric deep white matter. Generalized atrophy, temporal lobe  predominant. 1. MRA head: No large or medium vessel occlusion or correctable proximal stenosis. 2. MRA neck: Allowing for noncontrast technique, there is no visible carotid bifurcation stenosis on the right. Left carotid bifurcation cannot be seen because of artifact. Antegrade flow is present in both vertebral arteries. Electronically Signed   By: Paulina Fusi [name].D.   On: 05/06/2020 13:56   MR BRAIN WO CONTRAST  Result Date: 05/21/2020 CLINICAL DATA:  Delirium. EXAM: MRI HEAD WITHOUT CONTRAST TECHNIQUE: Multiplanar, multiecho pulse sequences of the brain and surrounding structures were obtained without intravenous contrast. COMPARISON:  05/11/2020 FINDINGS: Brain: There is no evidence of an acute infarct, mass, midline shift, or extra-axial fluid collection. There may be a single chronic microhemorrhage in the right cerebellum. Patchy and early confluent T2  hyperintensities in the cerebral white matter are unchanged and nonspecific but compatible with moderate chronic small vessel ischemic disease. Mild chronic small vessel changes are present in the pons. Ventricular enlargement is unchanged and favored to be secondary to cerebral atrophy rather than hydrocephalus with note again made of severe bilateral mesial temporal lobe atrophy. Vascular: Major intracranial vascular flow voids are preserved. Skull and upper cervical spine: Unremarkable bone marrow signal. Sinuses/Orbits: Right cataract extraction. Mild mucosal thickening in the paranasal sinuses. Trace right mastoid effusion. Other: None. IMPRESSION: 1. No acute intracranial abnormality. 2. Moderate chronic small vessel ischemic disease and temporal lobe predominant cerebral atrophy. Electronically Signed   By: Sebastian Ache [name].D.   On: 05/21/2020 18:03   MR BRAIN WO CONTRAST  Result Date: 05/12/2020 CLINICAL DATA:  Delirium. EXAM: MRI HEAD WITHOUT CONTRAST TECHNIQUE: Multiplanar, multiecho pulse sequences of the brain and surrounding structures were  obtained without intravenous contrast. COMPARISON:  Head CT May 10, 2020; MRI of the brain November 13, 20. FINDINGS: Brain: No acute infarction, hemorrhage, extra-axial collection or mass lesion. Scattered and confluent foci of T2 hyperintensity are seen within the white matter of the cerebral hemispheres. Prominence of the supratentorial ventricles and sylvian fissures with relative preservation of the high convexity sulci, may be seen the setting normal pressure hydrocephalus in the appropriate clinical scenario. Prominent volume loss of the mesial temporal lobes. No significant change from prior MRI. Vascular: Normal flow voids. Skull and upper cervical spine: Normal marrow signal. Sinuses/Orbits: Mucosal thickening the ethmoid cells and sphenoid sinuses. Right lens surgery. Other: Mild bilateral mastoid effusion. IMPRESSION: 1. No acute intracranial abnormality. 2. Prominence of the supratentorial ventricles and Sylvian fissures with relative preservation of the high convexity sulci, may be seen the setting of normal pressure hydrocephalus in the appropriate clinical scenario. 3. Prominent volume loss of the mesial temporal lobes. 4. Moderate chronic small vessel ischemic changes. Electronically Signed   By: Baldemar Lenis [name].D.   On: 05/12/2020 00:02   MR BRAIN WO CONTRAST  Result Date: 05/06/2020 CLINICAL DATA:  Posterior cerebellar infarction, age indeterminate. EXAM: MRI HEAD WITHOUT CONTRAST MRA HEAD WITHOUT CONTRAST MRA NECK WITHOUT CONTRAST TECHNIQUE: Multiplanar, multiecho pulse sequences of the brain and surrounding structures were obtained without intravenous contrast. Angiographic images of the Circle of Willis were obtained using MRA technique without intravenous contrast. Angiographic images of the neck were obtained using MRA technique without intravenous contrast. Carotid stenosis measurements (when applicable) are obtained utilizing NASCET criteria, using the distal  internal carotid diameter as the denominator. COMPARISON:  Head CT 05/03/2020 FINDINGS: MRI HEAD FINDINGS Brain: Diffusion imaging does not show any acute or subacute infarction. There are old small vessel infarctions within the left posterior cerebellum. Mild chronic small-vessel change affects the pons. Cerebral hemispheres show generalized atrophy, temporal lobe predominant. There are moderate chronic small-vessel ischemic changes of the deep white matter. No large vessel territory cerebral hemispheric infarction. No mass lesion, hemorrhage, hydrocephalus or extra-axial collection. Vascular: Major vessels at the base of the brain show flow. Skull and upper cervical spine: Negative Sinuses/Orbits: Mild mucosal inflammatory changes of the sinuses. Orbits negative. Other: None MRA HEAD FINDINGS Both internal carotid arteries widely patent through the skull base and siphon regions. No siphon stenosis. The anterior and middle cerebral vessels are patent without proximal stenosis, aneurysm or vascular malformation. No large or medium vessel occlusion. Fenestrated left M1 segment. Both vertebral arteries are patent to the basilar. No basilar stenosis. Posterior circulation branch vessels appear normal. MRA  NECK FINDINGS Limited resolution due to noncontrast technique. Both common carotid arteries show antegrade flow to the bifurcation regions. On the right, there is no apparent stenosis. On the left, there is too much artifact to evaluate the bifurcation region. Antegrade flow is present in both vertebral arteries. IMPRESSION: No acute intracranial finding. 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 Moderate chronic small-vessel ischemic changes of the cerebral hemispheric deep white matter. Generalized atrophy, temporal lobe predominant. 1. MRA head: No large or medium vessel occlusion or correctable proximal stenosis. 2. MRA neck: Allowing for noncontrast technique, there is no visible carotid  bifurcation stenosis on the right. Left carotid bifurcation cannot be seen because of artifact. Antegrade flow is present in both vertebral arteries. Electronically Signed   By: Paulina Fusi [name].D.   On: 05/06/2020 13:56   DG Chest Port 1 View  Result Date: 05/21/2020 CLINICAL DATA:  Shortness of breath EXAM: PORTABLE CHEST 1 VIEW COMPARISON:  May 14, 2020. FINDINGS: Feeding tube tip is below the diaphragm, likely in the distal stomach or proximal duodenum region. No pneumothorax. Small pleural effusions noted bilaterally. No edema or airspace opacity appreciable. Heart is upper normal in size with pulmonary vascularity normal. No adenopathy. There is aortic atherosclerosis. No bone lesions. IMPRESSION: Feeding tube as described. Small layering pleural effusions bilaterally. No edema or airspace opacity. Heart upper normal in size. Aortic Atherosclerosis (ICD10-I70.0). Electronically Signed   By: Bretta Bang III [name].D.   On: 05/21/2020 08:41   DG CHEST PORT 1 VIEW  Result Date: 05/14/2020 CLINICAL DATA:  Respiratory acidosis EXAM: PORTABLE CHEST 1 VIEW COMPARISON:  05/11/2020 FINDINGS: Esophageal tube tip overlies the mid gastric region. Development of right greater than left hazy airspace infiltrates. No pleural effusion. Stable cardiomediastinal silhouette with aortic atherosclerosis. No pneumothorax. IMPRESSION: Development of right greater than left hazy basilar airspace infiltrates. Electronically Signed   By: Jasmine Pang [name].D.   On: 05/14/2020 23:53   DG CHEST PORT 1 VIEW  Result Date: 05/11/2020 CLINICAL DATA:  Altered mental status. EXAM: PORTABLE CHEST 1 VIEW COMPARISON:  05/03/2020. FINDINGS: Cardiomegaly. No pulmonary venous congestion. Low lung volumes. No focal infiltrate. No pleural effusion or pneumothorax. Degenerative change thoracic spine. IMPRESSION: 1. Cardiomegaly. No pulmonary venous congestion. 2. Low lung volumes. No focal infiltrate. Electronically Signed   By: Maisie Fus   Register   On: 05/11/2020 07:30   DG Swallowing Func-Speech Pathology  Result Date: 05/17/2020 Objective Swallowing Evaluation: Type of Study: MBS-Modified Barium Swallow Study  Patient Details Name: [name] MRN: [id number] Date of Birth: 1940-09-20 Today's Date: 05/17/2020 Time: SLP Start Time (ACUTE ONLY): 1329 -SLP Stop Time (ACUTE ONLY): 1349 SLP Time Calculation (min) (ACUTE ONLY): 20 min Past Medical History: Past Medical History: Diagnosis Date . B12 deficiency 11/19/2018 . Benign prostatic hypertrophy  . Edema  . Hyperlipidemia  . Hypertension  . Insomnia  . Parasomnia 10/14/2011  PSG 12/15/11>>AHI 0.4, SpO2 low 89%, PLMI 16, somnoliloquay, nocturia  . Prediabetes 06/10/2012 . Seasonal allergies  . Sepsis (HCC) 01/2019 Past Surgical History: Past Surgical History: Procedure Laterality Date . CYSTOSCOPY/URETEROSCOPY/HOLMIUM LASER/STENT PLACEMENT Left 07/05/2019  Procedure: CYSTOSCOPY/URETEROSCOPY/STENT PLACEMENT/REROGRADE;  Surgeon: Crista Elliot, MD;  Location: [facility];  Service: Urology;  Laterality: Left; . FINGER SURGERY Right 04/19/2014  RIGHT INDEX & RING FINGER   FROM DOG BITE  . I & D EXTREMITY Right 04/20/2014  Procedure: IRRIGATION AND DEBRIDEMENT right index finger and right ring finger;  Surgeon: Dominica Severin, MD;  Location: [facility] OR;  Service: Orthopedics;  Laterality: Right; . I & D EXTREMITY Right 04/21/2014  Procedure: IRRIGATION AND DEBRIDEMENT RIGHT HAND RING FINGER, INDEX FINGER AND Flexor Tenolysis and FDS Tenotomy;  Surgeon: Dominica Severin, MD;  Location: [facility] OR;  Service: Orthopedics;  Laterality: Right; HPI: 79 y.o. male with a history of Parkinson's disease, dementia, increasing falls at home, HTN, covid-19 vaccinations, and BPH who presented to the ED from home after 2 episodes of loss of consciousness. He has had altered mental status since arrival, so history is limited.age-indeterminate infarct of the posterior left cerebellum was noted  No data recorded Assessment / Plan  / Recommendation CHL IP CLINICAL IMPRESSIONS 05/17/2020 Clinical Impression Pt adequately awake for MBS however drowsy and required intermittent verbal/tactile stimulation. Oropharyngeal dysphagia was minimal without laryngeal penetration or aspiration observed. Orally, there was delayed propulsion with decreased cohesion, sublingual spill and lingual residue. Mild-mod vallecular residue with thin which reached pyriform sinuses with straw perhaps due to drowsiness. He did not have dentures donned and states they are in "all the time" therefore, puree (Dys 1) texutre recommended in addition to inconsistent alertness. Prognosis to return to regular is good. Pills whole in puree if alert.   SLP Visit Diagnosis Dysphagia, oropharyngeal phase (R13.12) Attention and concentration deficit following -- Frontal lobe and executive function deficit following -- Impact on safety and function --   [facility] IP TREATMENT RECOMMENDATION 05/17/2020 Treatment Recommendations Therapy as outlined in treatment plan below   Prognosis 05/17/2020 Prognosis for Safe Diet Advancement Good Barriers to Reach Goals -- Barriers/Prognosis Comment -- [facility] IP DIET RECOMMENDATION 05/17/2020 SLP Diet Recommendations Dysphagia 1 (Puree) solids;Thin liquid Liquid Administration via Cup;Straw Medication Administration Whole meds with puree Compensations Slow rate;Small sips/bites Postural Changes Seated upright at 90 degrees   [facility] IP OTHER RECOMMENDATIONS 05/17/2020 Recommended Consults -- Oral Care Recommendations Oral care BID Other Recommendations --   [facility] IP FOLLOW UP RECOMMENDATIONS 05/17/2020 Follow up Recommendations Skilled Nursing facility   [facility] IP FREQUENCY AND DURATION 05/17/2020 Speech Therapy Frequency (ACUTE ONLY) min 2x/week Treatment Duration 2 weeks      [facility] IP ORAL PHASE 05/17/2020 Oral Phase Impaired Oral - Pudding Teaspoon -- Oral - Pudding Cup -- Oral - Honey Teaspoon -- Oral - Honey Cup -- Oral - Nectar Teaspoon -- Oral - Nectar Cup  Decreased bolus cohesion;Reduced posterior propulsion Oral - Nectar Straw -- Oral - Thin Teaspoon -- Oral - Thin Cup Decreased bolus cohesion Oral - Thin Straw -- Oral - Puree Decreased bolus cohesion;Reduced posterior propulsion Oral - Mech Soft -- Oral - Regular -- Oral - Multi-Consistency -- Oral - Pill -- Oral Phase - Comment --  [facility] IP PHARYNGEAL PHASE 05/17/2020 Pharyngeal Phase Impaired Pharyngeal- Pudding Teaspoon -- Pharyngeal -- Pharyngeal- Pudding Cup -- Pharyngeal -- Pharyngeal- Honey Teaspoon -- Pharyngeal -- Pharyngeal- Honey Cup -- Pharyngeal -- Pharyngeal- Nectar Teaspoon -- Pharyngeal -- Pharyngeal- Nectar Cup Delayed swallow initiation-vallecula Pharyngeal -- Pharyngeal- Nectar Straw -- Pharyngeal -- Pharyngeal- Thin Teaspoon -- Pharyngeal -- Pharyngeal- Thin Cup WFL Pharyngeal -- Pharyngeal- Thin Straw Delayed swallow initiation-pyriform sinuses;Pharyngeal residue - valleculae;Delayed swallow initiation-vallecula Pharyngeal -- Pharyngeal- Puree WFL Pharyngeal -- Pharyngeal- Mechanical Soft -- Pharyngeal -- Pharyngeal- Regular -- Pharyngeal -- Pharyngeal- Multi-consistency -- Pharyngeal -- Pharyngeal- Pill -- Pharyngeal -- Pharyngeal Comment --  [facility] IP CERVICAL ESOPHAGEAL PHASE 05/17/2020 Cervical Esophageal Phase WFL Pudding Teaspoon -- Pudding Cup -- Honey Teaspoon -- Honey Cup -- Nectar Teaspoon -- Nectar Cup -- Nectar Straw -- Thin Teaspoon -- Thin Cup -- Thin Straw --  Puree -- Mechanical Soft -- Regular -- Multi-consistency -- Pill -- Cervical Esophageal Comment -- Royce MacadamiaLitaker, Lisa Willis 05/17/2020, 3:28 PM Breck CoonsLisa Willis Lonell FaceLitaker [name].Ed Sports administratorCCC-SLP Speech-Language Pathologist Pager [phone] Office [phone]              EEG adult  Result Date: 05/22/2020 Larry QuestYadav, Priyanka O, MD     05/22/2020 12:41 PM Patient Name: [name] MRN: [id number] Epilepsy Attending: Charlsie QuestPriyanka O Yadav Referring Physician/Provider: Dr. Susa RaringPrashant Singh Date: 05/22/2020 Duration: 23.52 minutes Patient history: 79 year old male  with persistent encephalopathy in the setting of Parkinson's disease and dementia.  EEG to evaluate for seizures. Level of alertness: Awake AEDs during EEG study: None Technical aspects: This EEG study was done with scalp electrodes positioned according to the 10-20 International system of electrode placement. Electrical activity was acquired at a sampling rate of 500Hz  and reviewed with a high frequency filter of 70Hz  and a low frequency filter of 1Hz . EEG data were recorded continuously and digitally stored. Description: The posterior dominant rhythm consists of 8Hz  activity of moderate voltage (25-35 uV) seen predominantly in posterior head regions, symmetric and reactive to eye opening and eye closing. EEG showed continuous generalized 3 to 6 Hz theta-delta slowing. Hyperventilation and photic stimulation were not performed.   ABNORMALITY - Continuous slow, generalized IMPRESSION: This study is suggestive of mild diffuse encephalopathy, nonspecific etiology. No seizures or epileptiform discharges were seen throughout the recording. Priyanka Annabelle Harman Yadav   Overnight EEG with video  Result Date: 05/23/2020 Larry QuestYadav, Priyanka O, MD     05/23/2020  4:11 PM Patient Name: [name] MRN: [id number] Epilepsy Attending: Charlsie QuestPriyanka O Yadav Referring Physician/Provider: Dr. Onalee HuaMcNeil Kirkpatrick Duration:  05/22/1020 1017 to 05/23/2020 1017  Patient history: 79 year old male with persistent encephalopathy in the setting of Parkinson's disease and dementia.  EEG to evaluate for seizures.  Level of alertness: Awake, asleep  AEDs during EEG study: None  Technical aspects: This EEG study was done with scalp electrodes positioned according to the 10-20 International system of electrode placement. Electrical activity was acquired at a sampling rate of 500Hz  and reviewed with a high frequency filter of 70Hz  and a low frequency filter of 1Hz . EEG data were recorded continuously and digitally stored.  Description: The posterior  dominant rhythm consists of 8Hz  activity of moderate voltage (25-35 uV) seen predominantly in posterior head regions, symmetric and reactive to eye opening and eye closing.  Sleep was characterized by vertex is currently 10 (12-14 at), maximal frontocentral region.  EEG showed continuous generalized 3 to 6 Hz theta-delta slowing. Hyperventilation and photic stimulation were not performed.   EEG showed electrode artifact after 2230 which made parts of study difficult to interpret.  ABNORMALITY - Continuous slow, generalized  IMPRESSION: This study is suggestive of mild diffuse encephalopathy, nonspecific etiology. No seizures or epileptiform discharges were seen throughout the recording.  Priyanka O Yadav   VAS US CAROTID  Result Date: 05/06/2020 Carotid Arterial Duplex Study Indications:       CVA. Risk Factors:      Hypertension. Limitations        Today's exam was limited due to the patient's respiratory                    variation. Comparison Study:  No prior studies. Performing Technologist: Chanda BusingGregory Collins RVT  Examination Guidelines: A complete evaluation includes B-mode imaging, spectral Doppler, color Doppler, and power Doppler as needed of all accessible portions of each vessel. Bilateral testing is considered an integral  part of a complete examination. Limited examinations for reoccurring indications may be performed as noted.  Right Carotid Findings: +----------+--------+--------+--------+-----------------------+--------+           PSV cm/sEDV cm/sStenosisPlaque Description     Comments +----------+--------+--------+--------+-----------------------+--------+ CCA Prox  111     14              smooth and heterogenous         +----------+--------+--------+--------+-----------------------+--------+ CCA Distal78      14              smooth and heterogenous         +----------+--------+--------+--------+-----------------------+--------+ ICA Prox  97      16              smooth  and heterogenous         +----------+--------+--------+--------+-----------------------+--------+ ICA Distal51      14                                     tortuous +----------+--------+--------+--------+-----------------------+--------+ ECA       224     26                                              +----------+--------+--------+--------+-----------------------+--------+ +----------+--------+-------+--------+-------------------+           PSV cm/sEDV cmsDescribeArm Pressure (mmHG) +----------+--------+-------+--------+-------------------+ [id number]                                        +----------+--------+-------+--------+-------------------+ +---------+--------+--+--------+--+---------+ VertebralPSV cm/s59EDV cm/s12Antegrade +---------+--------+--+--------+--+---------+  Left Carotid Findings: +----------+--------+--------+--------+-----------------------+--------+           PSV cm/sEDV cm/sStenosisPlaque Description     Comments +----------+--------+--------+--------+-----------------------+--------+ CCA Prox  107     14              smooth and heterogenous         +----------+--------+--------+--------+-----------------------+--------+ CCA Distal111     14              smooth and heterogenous         +----------+--------+--------+--------+-----------------------+--------+ ICA Prox  133     21              smooth and heterogenous         +----------+--------+--------+--------+-----------------------+--------+ ICA Distal88      24                                     tortuous +----------+--------+--------+--------+-----------------------+--------+ ECA       107     9                                               +----------+--------+--------+--------+-----------------------+--------+ +----------+--------+--------+--------+-------------------+           PSV cm/sEDV cm/sDescribeArm Pressure (mmHG)  +----------+--------+--------+--------+-------------------+ Subclavian200                                         +----------+--------+--------+--------+-------------------+ +---------+--------+--+--------+--+---------+  VertebralPSV cm/s57EDV cm/s12Antegrade +---------+--------+--+--------+--+---------+   Summary: Right Carotid: Velocities in the right ICA are consistent with a 1-39% stenosis. Left Carotid: Velocities in the left ICA are consistent with a 1-39% stenosis. Vertebrals: Bilateral vertebral arteries demonstrate antegrade flow. *See table(s) above for measurements and observations.  Electronically signed by Fabienne Bruns MD on 05/06/2020 at 11:49:43 AM.    Final    VAS [place] UPPER EXTREMITY VENOUS DUPLEX  Result Date: 05/16/2020 UPPER VENOUS STUDY  Indications: Edema Comparison Study: No prior studies. Performing Technologist: Jean Rosenthal  Examination Guidelines: A complete evaluation includes B-mode imaging, spectral Doppler, color Doppler, and power Doppler as needed of all accessible portions of each vessel. Bilateral testing is considered an integral part of a complete examination. Limited examinations for reoccurring indications may be performed as noted.  Right Findings: +----------+------------+---------+-----------+----------+-------+ RIGHT     CompressiblePhasicitySpontaneousPropertiesSummary +----------+------------+---------+-----------+----------+-------+ Subclavian    Full       Yes       Yes                      +----------+------------+---------+-----------+----------+-------+  Left Findings: +----------+------------+---------+-----------+----------+-------+ LEFT      CompressiblePhasicitySpontaneousPropertiesSummary +----------+------------+---------+-----------+----------+-------+ IJV           Full       Yes       Yes                      +----------+------------+---------+-----------+----------+-------+ Subclavian    Full       Yes        Yes                      +----------+------------+---------+-----------+----------+-------+ Axillary      Full       Yes       Yes                      +----------+------------+---------+-----------+----------+-------+ Brachial      Full                                          +----------+------------+---------+-----------+----------+-------+ Radial        Full                                          +----------+------------+---------+-----------+----------+-------+ Ulnar         Full                                          +----------+------------+---------+-----------+----------+-------+ Cephalic      Full                                          +----------+------------+---------+-----------+----------+-------+ Basilic       Full                                          +----------+------------+---------+-----------+----------+-------+  Summary:  Right: No evidence of thrombosis in the subclavian.  Left: No evidence  of deep vein thrombosis in the upper extremity. No evidence of superficial vein thrombosis in the upper extremity.  *See table(s) above for measurements and observations.  Diagnosing physician: Waverly Ferrari MD Electronically signed by Waverly Ferrari MD on 05/16/2020 at 5:58:51 PM.    Final    ECHOCARDIOGRAM LIMITED  Result Date: 05/04/2020    ECHOCARDIOGRAM LIMITED REPORT   Patient Name:   [name] Date of Exam: 05/04/2020 Medical Rec #:  [id number]      Height:       68.0 in Accession #:    [id number]     Weight:       170.9 lb Date of Birth:  May 16, 1941       BSA:          1.911 [name] Patient Age:    79 years       BP:           180/84 mmHg Patient Gender: [name]              HR:           70 bpm. Exam Location:  Inpatient Procedure: Limited Echo, Cardiac Doppler and Color Doppler Indications:    Syncope  History:        Patient has no prior history of Echocardiogram examinations.                 COPD, Signs/Symptoms:Syncope; Risk  Factors:Hypertension,                 Dyslipidemia and Former Smoker. Parkinsons Dementia, COVID+.  Sonographer:    Lavenia Atlas Referring Phys: Albertine Grates  Sonographer Comments: Technically challenging study due to limited acoustic windows. Image acquisition challenging due to COPD. IMPRESSIONS  1. Left ventricular ejection fraction, by estimation, is 55 to 60%. The left ventricle has normal function. There is mild left ventricular hypertrophy. Left ventricular diastolic parameters are consistent with Grade I diastolic dysfunction (impaired relaxation).  2. Right ventricular systolic function is normal. The right ventricular size is normal. Tricuspid regurgitation signal is inadequate for assessing PA pressure.  3. Left atrial size was mildly dilated.  4. The mitral valve is normal in structure. No evidence of mitral valve regurgitation. No evidence of mitral stenosis.  5. The aortic valve was not well visualized. Aortic valve regurgitation is trivial. No aortic stenosis is present.  6. Technically difficult study with poor acoustic windows. FINDINGS  Left Ventricle: Left ventricular ejection fraction, by estimation, is 55 to 60%. The left ventricle has normal function. The left ventricular internal cavity size was normal in size. There is mild left ventricular hypertrophy. Left ventricular diastolic  parameters are consistent with Grade I diastolic dysfunction (impaired relaxation). Right Ventricle: The right ventricular size is normal. No increase in right ventricular wall thickness. Right ventricular systolic function is normal. Tricuspid regurgitation signal is inadequate for assessing PA pressure. Left Atrium: Left atrial size was mildly dilated. Right Atrium: Right atrial size was normal in size. Mitral Valve: The mitral valve is normal in structure. No evidence of mitral valve stenosis. Tricuspid Valve: The tricuspid valve is normal in structure. Tricuspid valve regurgitation is not demonstrated. Aortic  Valve: The aortic valve was not well visualized. Aortic valve regurgitation is trivial. No aortic stenosis is present. Pulmonic Valve: The pulmonic valve was not well visualized. Aorta: The aortic root is normal in size and structure. Venous: The inferior vena cava was not well visualized. IAS/Shunts: No atrial level shunt detected by color flow Doppler. LEFT  VENTRICLE PLAX 2D LVIDd:         3.70 cm Diastology LVIDs:         2.60 cm LV e' medial:    4.79 cm/s LV PW:         1.40 cm LV E/e' medial:  9.3 LV IVS:        1.20 cm LV e' lateral:   5.77 cm/s                        LV E/e' lateral: 7.7  RIGHT VENTRICLE RV S prime:     7.07 cm/s MITRAL VALVE MV Area (PHT): 2.22 cm MV Decel Time: 342 msec MV E velocity: 44.60 cm/s MV A velocity: 62.60 cm/s MV E/A ratio:  0.71 Marca Ancona MD Electronically signed by Marca Ancona MD Signature Date/Time: 05/04/2020/3:45:20 PM    Final      PERTINENT LAB RESULTS: CBC: No results for input(s): WBC, HGB, HCT, PLT in the last 72 hours. CMET CMP     Component Value Date/Time   NA 138 05/29/2020 0731   K 3.6 05/29/2020 0731   CL 102 05/29/2020 0731   CO2 29 05/29/2020 0731   GLUCOSE 105 (H) 05/29/2020 0731   BUN 25 (H) 05/29/2020 0731   CREATININE 1.11 05/31/2020 0435   CREATININE 1.55 (H) 01/17/2020 1535   CALCIUM 8.6 (L) 05/29/2020 0731   PROT 5.0 (L) 05/25/2020 0207   ALBUMIN 2.3 (L) 05/25/2020 0207   AST 16 05/25/2020 0207   ALT 15 05/25/2020 0207   ALKPHOS 53 05/25/2020 0207   BILITOT 0.3 05/25/2020 0207   GFRNONAA >60 05/31/2020 0435   GFRNONAA 42 (L) 01/17/2020 1535   GFRAA 48 (L) 01/17/2020 1535    GFR Estimated Creatinine Clearance: 51.4 mL/min (by C-G formula based on SCr of 1.11 mg/dL). No results for input(s): LIPASE, AMYLASE in the last 72 hours. No results for input(s): CKTOTAL, CKMB, CKMBINDEX, TROPONINI in the last 72 hours. Invalid input(s): POCBNP No results for input(s): DDIMER in the last 72 hours. No results for input(s):  HGBA1C in the last 72 hours. No results for input(s): CHOL, HDL, LDLCALC, TRIG, CHOLHDL, LDLDIRECT in the last 72 hours. No results for input(s): TSH, T4TOTAL, T3FREE, THYROIDAB in the last 72 hours.  Invalid input(s): FREET3 No results for input(s): VITAMINB12, FOLATE, FERRITIN, TIBC, IRON, RETICCTPCT in the last 72 hours. Coags: No results for input(s): INR in the last 72 hours.  Invalid input(s): PT Microbiology: No results found for this or any previous visit (from the past 240 hour(s)).  FURTHER DISCHARGE INSTRUCTIONS:  Get Medicines reviewed and adjusted: Please take all your medications with you for your next visit with your Primary MD  Laboratory/radiological data: Please request your Primary MD to go over all hospital tests and procedure/radiological results at the follow up, please ask your Primary MD to get all Hospital records sent to his/her office.  In some cases, they will be blood work, cultures and biopsy results pending at the time of your discharge. Please request that your primary care [name].D. goes through all the records of your hospital data and follows up on these results.  Also Note the following: If you experience worsening of your admission symptoms, develop shortness of breath, life threatening emergency, suicidal or homicidal thoughts you must seek medical attention immediately by calling 911 or calling your MD immediately  if symptoms less severe.  You must read complete instructions/literature along with all the possible adverse reactions/side  effects for all the Medicines you take and that have been prescribed to you. Take any new Medicines after you have completely understood and accpet all the possible adverse reactions/side effects.   Do not drive when taking Pain medications or sleeping medications (Benzodaizepines)  Do not take more than prescribed Pain, Sleep and Anxiety Medications. It is not advisable to combine anxiety,sleep and pain medications  without talking with your primary care practitioner  Special Instructions: If you have smoked or chewed Tobacco  in the last 2 yrs please stop smoking, stop any regular Alcohol  and or any Recreational drug use.  Wear Seat belts while driving.  Please note: You were cared for by a hospitalist during your hospital stay. Once you are discharged, your primary care physician will handle any further medical issues. Please note that NO REFILLS for any discharge medications will be authorized once you are discharged, as it is imperative that you return to your primary care physician (or establish a relationship with a primary care physician if you do not have one) for your post hospital discharge needs so that they can reassess your need for medications and monitor your lab values.  Total Time spent coordinating discharge including counseling, education and face to face time equals 35 minutes.  SignedJeoffrey Massed 05/31/2020 10:44 AM

## 2020-05-31 NOTE — Progress Notes (Signed)
Patient BP not controlled with PRN medication, MD notified, Advised to monitor patient as next BP med is due soon

## 2020-05-31 NOTE — Progress Notes (Signed)
[facility] ([facility] Community Based Palliative Care       This patient has been referred to our palliative care services in the community.  [facility] will continue to follow for any discharge planning needs and to coordinate admission onto palliative care.   If you have questions or need assistance, please call [phone] or contact the hospital Liaison listed on AMION.     Thank you for the opportunity to participate in this patient's care.     Chrislyn King, BSN, RN [facility] Hospital Liaison   [phone] 

## 2020-05-31 NOTE — Progress Notes (Signed)
OT Cancellation Note  Patient Details Name: [name] MRN: [id number] DOB: [birth year]   Cancelled Treatment:    Reason Eval/Treat Not Completed: Other (comment) patient awaiting transport to SNF.   [name] OTR/L Supplemental OT, Department of rehab services [phone]  [name] R H. 05/31/2020, 2:34 PM

## 2020-05-31 NOTE — Progress Notes (Signed)
Patient's AM blood pressure was 166/84, given hydralazine per orders. Patient's noon BP was 102/48, MD notified, no new orders.

## 2020-06-01 ENCOUNTER — Telehealth: Payer: Self-pay | Admitting: Neurology

## 2020-06-01 NOTE — Telephone Encounter (Signed)
Called [facility] back was sent to voice mail,  left a voice mail for them to call me back

## 2020-06-01 NOTE — Telephone Encounter (Signed)
  Patient scheduled 06/12/20 at 3:00 PM with Dr. Karel Jarvis for follow up.

## 2020-06-01 NOTE — Telephone Encounter (Signed)
Pt has an appointment on 06/12/20, they will fax over med list with up to date VS and if pt has had any falls

## 2020-06-12 ENCOUNTER — Encounter: Payer: Self-pay | Admitting: Neurology

## 2020-06-12 ENCOUNTER — Other Ambulatory Visit: Payer: Self-pay

## 2020-06-12 ENCOUNTER — Ambulatory Visit: Payer: Medicare Other | Admitting: Neurology

## 2020-06-12 VITALS — BP 135/74 | HR 61 | Ht 68.0 in

## 2020-06-12 DIAGNOSIS — F0391 Unspecified dementia with behavioral disturbance: Secondary | ICD-10-CM

## 2020-06-12 DIAGNOSIS — G20 Parkinson's disease: Secondary | ICD-10-CM | POA: Diagnosis not present

## 2020-06-12 DIAGNOSIS — F03B18 Unspecified dementia, moderate, with other behavioral disturbance: Secondary | ICD-10-CM

## 2020-06-12 NOTE — Patient Instructions (Addendum)
1. Try holding the Sinemet and see if this helps with the sedation and confusion. If no change in symptoms or worsening of symptoms, restart Sinemet  2. Continue close supervision  3. Continue Palliative Care visits  4. Follow-up in 6 months, call for any changes

## 2020-06-12 NOTE — Progress Notes (Signed)
NEUROLOGY FOLLOW UP OFFICE NOTE  [name] [id number] 15-Jan-1941  HISTORY OF PRESENT ILLNESS: I had the pleasure of seeing [name] in follow-up in the neurology clinic on 06/12/2020.  The patient was last seen 8 months ago for likely Lewy Body dementia. He is again accompanied by his wife who helps supplement the history today. Records and images were personally reviewed where available.  Since his last visit, he was admitted twice in November, he had 2 syncopal episodes and altered mental status, diagnosed with COVID (incidental) and received Bamlanivimab/etesevimab. MRI brain no acute changes. He had an EEG which showed diffuse slowing. He was admitted again on 11/17 for waxing and waning mental status and severe failure to thrive, diagnosed with metabolic encephalopathy superimposed on LBD. Repeat MRI brain and EEG did not show any acute changes. Palliative care got involved, he was discharged 2 weeks ago off Exelon. He continues on Sinemet 25/100mg  1 tab TID without side effects. His wife is concerned about continued decline, he is having difficulty following instructions. She states that prior to hospitalization, he was fixing his own medications but now requires higher level of care. She is unable to care for him at home. He can feed himself but she has to cut his food up, no choking. He is drowsy in the office today, she reports that he is usually awake when she visits him. He works with PT and ambulates with a walker. He needs assistance with dressing and bathing. He continues to have visual hallucinations that are non-threatening. Sleep is good on Trazodone 50mg  qhs.     History on Initial Assessment 11/05/2018: This is a pleasant 79 year old right-handed man with a history of hypertension, diet-controlled hyperlipidemia, presenting for evaluation of memory loss and frequent falls. He states his balance is off. He also reports that his memory "ain't as good as it used to be." He  misplaces things frequently. 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 He would say someone stole something because he could not find them. It was not happening frequently, she would sometimes notice that he would put things where they don't belong. A year ago she started having driving concerns, she would be in the car with him and would ask where he is going, he would stop in the middle of the street. He stopped driving after instructed by Dr. 70 in March 2020. His wife fixes his pillbox and checks behind him. He continues to manage finances and make financial decisions but she has been checking behind him the past 4-5 months. She states that over the past 4-5 months, confusion has been worsening that sometimes he does not realize he is at home, always talking about going home. At times he does not seem to recognize his wife. Symptoms are worse in the afternoon. He also has difficulty understanding instructions to pick his feet up or to help himself up when he falls. He has difficulty getting in and out of bed, or even sometimes moving in bed, as if he cannot turn or move his body up. He has not been sleeping well, waking up at 1am despite recently increased Trazodone 50mg  qhs. He has been having visual hallucinations, asking who is that in the bed or in the room, worse in the evening. She has been helping him with dressing and bathing the past month.  Her main concern have been the significant falls over the past 4 months. Sometimes he  can walk independently, but a lot of times she has to help him up, especially getting in and out of bed, or even from a chair. He has a walker now but forgets to pick up his feet to move. He has had 8 or 12 falls in the past 4 months. If he falls and she tries to help him, he cannot seem to understand instructions to push himself up. He denies any neck or back pain, no leg pain, numbness/tingling. It feels like his  legs want to give out on him when he stands. No bowel/bladder dysfunction. He has right shoulder pain. His wife is asking about pain medication when he is so stiff waking up in the morning. He denies any headaches, dizziness, diplopia, dysarthria/dysphagia, anosmia. Sometimes he has some shakiness in his hands like he is nervous. His wife reports personality changes as well, he used to be a jokester. His father had Parkinson's disease. No history of significant head injuries or alcohol use.   I personally reviewed head CT without contrast done 09/23/2018 which did not show any acute changes, there was mild to moderate diffuse atrophy and chronic microvascular disease.  PAST MEDICAL HISTORY: Past Medical History:  Diagnosis Date  . B12 deficiency 11/19/2018  . Benign prostatic hypertrophy   . Edema   . Hyperlipidemia   . Hypertension   . Insomnia   . Parasomnia 10/14/2011   PSG 12/15/11>>AHI 0.4, SpO2 low 89%, PLMI 16, somnoliloquay, nocturia   . Prediabetes 06/10/2012  . Seasonal allergies   . Sepsis (HCC) 01/2019    MEDICATIONS: Current Outpatient Medications on File Prior to Visit  Medication Sig Dispense Refill  . aspirin 81 MG chewable tablet Chew 1 tablet (81 mg total) by mouth daily.    . carbidopa-levodopa (SINEMET IR) 25-100 MG tablet Take 1 tablet three times a day with meals (Patient taking differently: Take 1 tablet by mouth 3 (three) times daily.) 90 tablet 11  . carvedilol (COREG) 6.25 MG tablet Take 1 tablet (6.25 mg total) by mouth 2 (two) times daily with a meal.    . Cholecalciferol (VITAMIN D) 1000 UNITS capsule Take 1 capsule (1,000 Units total) by mouth daily. 100 capsule 3  . feeding supplement (ENSURE ENLIVE / ENSURE PLUS) LIQD Take 237 mLs by mouth 2 (two) times daily between meals. 237 mL 12  . Nutritional Supplements (,FEEDING SUPPLEMENT, PROSOURCE PLUS) liquid Take 30 mLs by mouth 2 (two) times daily between meals.    . pantoprazole sodium (PROTONIX) 40 mg/20 mL  PACK Take 20 mLs (40 mg total) by mouth daily. 30 mL   . rosuvastatin (CRESTOR) 10 MG tablet Take 1 tablet (10 mg total) by mouth daily.    . SYRINGE-NEEDLE, DISP, 3 ML (BD ECLIPSE SYRINGE) 25G X 1" 3 ML MISC Use sq as directed 50 each 3  . vitamin B-12 (CYANOCOBALAMIN) 1000 MCG tablet Take 1,000 mcg by mouth daily.     No current facility-administered medications on file prior to visit.    ALLERGIES: Allergies  Allergen Reactions  . Amlodipine Other (See Comments)    Edema w/10 mg  . Lorazepam Other (See Comments)    DO NOT GIVE BENZO TO PARKINSON PT WITH CONCERNS FOR LEWY BODY DEMENTIA, CAUSES PARADOXICAL WORSENING OF THEIR AGITATION!  . Penicillins Hives    Has patient had a PCN reaction causing immediate rash, facial/tongue/throat swelling, SOB or lightheadedness with hypotension: Yes Has patient had a PCN reaction causing severe rash involving mucus membranes or skin necrosis: No  Has patient had a PCN reaction that required hospitalization No Has patient had a PCN reaction occurring within the last 10 years: No If all of the above answers are "NO", then may proceed with Cephalosporin use.   . Sulfonamide Derivatives Hives  . Atorvastatin     REACTION: leg cramps  . Sulfa Antibiotics Other (See Comments) and Palpitations    Sweating, passed out  . Telmisartan Other (See Comments)    ?fatigue    FAMILY HISTORY: Family History  Problem Relation Age of Onset  . Diabetes Mother   . Tremor Father   . Diabetes Other     SOCIAL HISTORY: Social History   Socioeconomic History  . Marital status: Married    Spouse name: Not on file  . Number of children: Not on file  . Years of education: Not on file  . Highest education level: Not on file  Occupational History  . Occupation: Retired  Tobacco Use  . Smoking status: Former Smoker    Packs/day: 1.00    Years: 7.00    Pack years: 7.00    Types: Cigarettes, Cigars  . Smokeless tobacco: Never Used  . Tobacco comment:  cigars day/ 1-2       QIUIT SMOKING IN 2000  Vaping Use  . Vaping Use: Never used  Substance and Sexual Activity  . Alcohol use: Not Currently    Comment: OCCASSIONAL WINE -1 glass/month  . Drug use: No  . Sexual activity: Not Currently    Partners: Female  Other Topics Concern  . 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 He has reduced fluency, no dysarthria. He is drowsy, arouseable but with difficulty following instructions despite repeated attempts. Cranial nerves: Pupils equal, round. Extraocular movements intact with no nystagmus (variable counting fingers). Visual fields full.  No facial asymmetry.  Motor: +cogwheeling. Muscle strength 5/5 throughout with no pronator drift.  Difficulty with finger to nose testing, no ataxia but does not hit finger. Gait not tested.   IMPRESSION: This is a pleasant 79 yo RH man with a history of hypertension, hyperlipidemia, who presented with worsening memory and frequent falls. Presentation included  frequent falls, gait instability, hallucinations, and bradykinesia/rigidity raising concern for a parkinsonian syndrome such as PSP or Lewy body dementia. He has had progressive decline since his last visit, now requiring higher level of care at a SNF. His wife is having difficulty with this, we had an extensive discussion about increased need for care,  caregiver support provided. He is drowsy in the office today, his wife feels it is the Sinemet, we discussed holding Sinemet for a week and monitor for any improvement, if no change, restart Sinemet 25/100mg  TID. Continue follow-up with Palliative Care and 24/7 supervision. Follow-up in 6 months, they know to call for any changes.    Thank you  for allowing me to participate in his care.  Please do not hesitate to call for any questions or concerns.   Patrcia Dolly, M.D.   CC: Dr. Posey Rea      PAST MEDICAL HISTORY:

## 2020-06-21 ENCOUNTER — Telehealth: Payer: Self-pay | Admitting: Neurology

## 2020-06-21 ENCOUNTER — Other Ambulatory Visit: Payer: Self-pay

## 2020-06-21 MED ORDER — CARBIDOPA-LEVODOPA 25-100 MG PO TABS
ORAL_TABLET | ORAL | 11 refills | Status: DC
Start: 2020-06-21 — End: 2020-10-17

## 2020-06-21 NOTE — Telephone Encounter (Signed)
Patient's wife called to ask if patient could go back on carbidopa levodopa? He is at [facility] and they won't give the medication to him without Dr Aquino's approval. Please call.

## 2020-06-21 NOTE — Telephone Encounter (Signed)
Order faxed to [facility]

## 2020-06-21 NOTE — Telephone Encounter (Signed)
Ok to give order to restart, thanks

## 2020-07-04 ENCOUNTER — Telehealth: Payer: Self-pay | Admitting: Internal Medicine

## 2020-07-04 NOTE — Telephone Encounter (Signed)
    HH ORDERS   Caller Name: [facility] Name: [facility] Callback Phone #: [phone] Service Requested: OT (examples: OT/PT/Skilled Nursing/Social Work/Speech Therapy/Wound Care) Frequency of Visits: 2W3, [phone]

## 2020-07-04 NOTE — Telephone Encounter (Signed)
HH orders updated  Caller Name: Cindee Lame  Agency: [facility] Phone # [phone] Service Requested: Nurse eval, PT  Frequency: [phone], [phone]

## 2020-07-04 NOTE — Telephone Encounter (Signed)
  FYI  [facility] nurse calling to report she spoke with the patient's spouse who informed her she is not giving the patient Protonix, Albuterol nor Buspar

## 2020-07-07 NOTE — Telephone Encounter (Signed)
OK. Thx

## 2020-07-07 NOTE — Telephone Encounter (Signed)
Called wll there was no answer LMOM w/MD response,,/lmb

## 2020-07-07 NOTE — Telephone Encounter (Signed)
Noted. Thx.

## 2020-07-14 ENCOUNTER — Telehealth: Payer: Self-pay | Admitting: Internal Medicine

## 2020-07-14 NOTE — Telephone Encounter (Signed)
FYI   Azure from [facility] calling to report patient had a fall on 01/19, small skin tear to right elbow, tegaderm applied

## 2020-07-17 NOTE — Telephone Encounter (Signed)
Noted. Thanks.

## 2020-07-19 ENCOUNTER — Ambulatory Visit: Payer: Medicare Other | Admitting: Internal Medicine

## 2020-07-19 ENCOUNTER — Encounter: Payer: Self-pay | Admitting: Internal Medicine

## 2020-07-19 ENCOUNTER — Other Ambulatory Visit: Payer: Self-pay

## 2020-07-19 DIAGNOSIS — N183 Chronic kidney disease, stage 3 unspecified: Secondary | ICD-10-CM

## 2020-07-19 DIAGNOSIS — F0280 Dementia in other diseases classified elsewhere without behavioral disturbance: Secondary | ICD-10-CM

## 2020-07-19 DIAGNOSIS — N418 Other inflammatory diseases of prostate: Secondary | ICD-10-CM | POA: Diagnosis not present

## 2020-07-19 DIAGNOSIS — G20 Parkinson's disease: Secondary | ICD-10-CM

## 2020-07-19 LAB — COMPREHENSIVE METABOLIC PANEL
ALT: 6 U/L (ref 0–53)
AST: 10 U/L (ref 0–37)
Albumin: 3.6 g/dL (ref 3.5–5.2)
Alkaline Phosphatase: 84 U/L (ref 39–117)
BUN: 24 mg/dL — ABNORMAL HIGH (ref 6–23)
CO2: 33 mEq/L — ABNORMAL HIGH (ref 19–32)
Calcium: 9.2 mg/dL (ref 8.4–10.5)
Chloride: 104 mEq/L (ref 96–112)
Creatinine, Ser: 1.57 mg/dL — ABNORMAL HIGH (ref 0.40–1.50)
GFR: 41.33 mL/min — ABNORMAL LOW (ref >60.00)
Glucose, Bld: 101 mg/dL — ABNORMAL HIGH (ref 70–99)
Potassium: 3.6 mEq/L (ref 3.5–5.1)
Sodium: 141 mEq/L (ref 135–145)
Total Bilirubin: 0.9 mg/dL (ref 0.2–1.2)
Total Protein: 6.3 g/dL (ref 6.0–8.3)

## 2020-07-19 LAB — TSH: TSH: 1.87 u[IU]/mL (ref 0.35–4.50)

## 2020-07-19 NOTE — Assessment & Plan Note (Signed)
BMET 

## 2020-07-19 NOTE — Addendum Note (Signed)
Addended by: Ebony Cargo on: 07/19/2020 11:43 AM   Modules accepted: Orders

## 2020-07-19 NOTE — Assessment & Plan Note (Signed)
There are visual hallucinations... On Sinemet

## 2020-07-19 NOTE — Assessment & Plan Note (Signed)
There are visual hallucinations.Marland KitchenMarland Kitchen

## 2020-07-19 NOTE — Progress Notes (Signed)
Subjective:  Patient ID: [name], male    DOB: 1940/08/31  Age: 80 y.o. MRN: [id number]  CC: 3 month f/u (Patient's wife would like to discuss her current medications. She has noticed that he is having a lot more sundowners. She thinks he may have another UTI. )   HPI [name] presents for dementia, Parkinson's, CRF f/u. There are visual hallucinations... C/o urine w/stronger smell  S/p recent hosp stay in 12/21: " Acute metabolic encephalopathy-superimposed on Parkinson's related dementia along with failure to thrive syndrome: Had extensive work-up recently-neurology evaluation completed-he will continue to have mental status fluctuations-it is felt that this is related to natural progression of Parkinson's disease related dementia.  Palliative care follow closely-multiple discussions were also done by prior hospitalist MDs-patient is a DNR-he does not desire artificial feeding-plan is to discharge to SNF with palliative care follow-up.  If he were to significantly decline in the future or have severe acute metabolic encephalopathy-family would not want to pursue any aggressive care.  Aspiration pneumonia: Finished a course of antimicrobial therapy.  History of CVA: Continue aspirin/statin.  AKI on CKD stage IIIa: AKI hemodynamically mediated-treated with supportive care/IV fluids-improved-creatinine close to baseline.  Parkinson's disease: Continue Sinemet-followed in the outpatient setting by neurology-Dr. Cira Servant.  Please ensure outpatient follow-up.  Bilateral upper extremity edema: Probably secondary to IVinfiltration-hypoalbuminemia-improved-Doppler without DVT."   Outpatient Medications Prior to Visit  Medication Sig Dispense Refill  . Amino Acids-Protein Hydrolys (PRO-STAT MAX PO) Take 30 mLs by mouth in the morning and at bedtime.    [name] aspirin 81 MG chewable tablet Chew 1 tablet (81 mg total) by mouth daily.    . busPIRone (BUSPAR) 7.5 MG tablet Take 7.5 mg  by mouth 2 (two) times daily. (Patient not taking: Reported on 07/05/2020)    . carbidopa-levodopa (SINEMET IR) 25-100 MG tablet Take 1 tablet three times a day with meals 90 tablet 11  . carvedilol (COREG) 3.125 MG tablet Take by mouth 2 (two) times daily. Hold for bp <110 or HR <50    . Cholecalciferol (VITAMIN D) 1000 UNITS capsule Take 1 capsule (1,000 Units total) by mouth daily. 100 capsule 3  . pantoprazole (PROTONIX) 40 MG tablet Take 40 mg by mouth daily. (Patient not taking: Reported on 07/05/2020)    . polyethylene glycol (MIRALAX / GLYCOLAX) 17 g packet Take 17 g by mouth daily.    . rosuvastatin (CRESTOR) 10 MG tablet Take 1 tablet (10 mg total) by mouth daily.    . SYRINGE-NEEDLE, DISP, 3 ML (BD ECLIPSE SYRINGE) 25G X 1" 3 ML MISC Use sq as directed 50 each 3  . traZODone (DESYREL) 50 MG tablet Take by mouth.    . vitamin B-12 (CYANOCOBALAMIN) 1000 MCG tablet Take 1,000 mcg by mouth daily.    . vitamin C (ASCORBIC ACID) 250 MG tablet Take 250 mg by mouth daily.    . pantoprazole sodium (PROTONIX) 40 mg/20 mL PACK Take 20 mLs (40 mg total) by mouth daily. (Patient not taking: Reported on 07/05/2020) 30 mL    No facility-administered medications prior to visit.    ROS: Review of Systems  Constitutional: Positive for fatigue. Negative for appetite change and unexpected weight change.  HENT: Negative for congestion, nosebleeds, sneezing, sore throat and trouble swallowing.   Eyes: Negative for itching and visual disturbance.  Respiratory: Negative for cough.   Cardiovascular: Negative for chest pain, palpitations and leg swelling.  Gastrointestinal: Negative for abdominal distention, blood in stool, diarrhea and  nausea.  Genitourinary: Negative for frequency and hematuria.  Musculoskeletal: Positive for gait problem. Negative for back pain, joint swelling and neck pain.  Skin: Negative for rash.  Neurological: Positive for weakness. Negative for dizziness, tremors and speech  difficulty.  Psychiatric/Behavioral: Positive for behavioral problems, confusion and decreased concentration. Negative for agitation, dysphoric mood, sleep disturbance and suicidal ideas. The patient is nervous/anxious.     Objective:  There were no vitals taken for this visit.  BP Readings from Last 3 Encounters:  06/12/20 135/74  05/31/20 (!) 105/58  05/09/20 116/66    Wt Readings from Last 3 Encounters:  05/31/20 173 lb 8 oz (78.7 kg)  05/04/20 170 lb 13.7 oz (77.5 kg)  04/13/20 170 lb 12.8 oz (77.5 kg)    Physical Exam Constitutional:      General: He is not in acute distress.    Appearance: He is well-developed.     Comments: NAD  HENT:     Mouth/Throat:     Mouth: Oropharynx is clear and moist.  Eyes:     Conjunctiva/sclera: Conjunctivae normal.     Pupils: Pupils are equal, round, and reactive to light.  Neck:     Thyroid: No thyromegaly.     Vascular: No JVD.  Cardiovascular:     Rate and Rhythm: Normal rate and regular rhythm.     Pulses: Intact distal pulses.     Heart sounds: Normal heart sounds. No murmur heard. No friction rub. No gallop.   Pulmonary:     Effort: Pulmonary effort is normal. No respiratory distress.     Breath sounds: Normal breath sounds. No wheezing or rales.  Chest:     Chest wall: No tenderness.  Abdominal:     General: Bowel sounds are normal. There is no distension.     Palpations: Abdomen is soft. There is no mass.     Tenderness: There is no abdominal tenderness. There is no guarding or rebound.  Musculoskeletal:        General: No tenderness or edema. Normal range of motion.     Cervical back: Normal range of motion.  Lymphadenopathy:     Cervical: No cervical adenopathy.  Skin:    General: Skin is warm and dry.     Findings: No rash.  Neurological:     Mental Status: He is alert.     Cranial Nerves: No cranial nerve deficit.     Motor: Weakness present. No abnormal muscle tone.     Coordination: He displays a negative  Romberg sign. Coordination abnormal.     Gait: Gait abnormal.     Deep Tendon Reflexes: Reflexes are normal and symmetric.  Psychiatric:        Mood and Affect: Mood and affect normal.   Using a walker ataxic  Lab Results  Component Value Date   WBC 5.2 05/25/2020   HGB 9.3 (L) 05/25/2020   HCT 26.8 (L) 05/25/2020   PLT 269 05/25/2020   GLUCOSE 105 (H) 05/29/2020   CHOL 139 05/05/2020   TRIG 52 05/05/2020   HDL 39 (L) 05/05/2020   LDLDIRECT 114.0 12/30/2016   LDLCALC 90 05/05/2020   ALT 15 05/25/2020   AST 16 05/25/2020   NA 138 05/29/2020   K 3.6 05/29/2020   CL 102 05/29/2020   CREATININE 1.11 05/31/2020   BUN 25 (H) 05/29/2020   CO2 29 05/29/2020   TSH 4.788 (H) 05/21/2020   PSA 0.31 08/04/2018   INR 1.4 (H) 07/03/2019   HGBA1C  5.4 05/05/2020   MICROALBUR 2.9 (H) 12/30/2016    CT Head Wo Contrast  Result Date: 05/10/2020 CLINICAL DATA:  Mental status changes EXAM: CT HEAD WITHOUT CONTRAST TECHNIQUE: Contiguous axial images were obtained from the base of the skull through the vertex without intravenous contrast. COMPARISON:  05/03/2020 FINDINGS: Brain: There is atrophy and chronic small vessel disease changes. No acute intracranial abnormality. Specifically, no hemorrhage, hydrocephalus, mass lesion, acute infarction, or significant intracranial injury. Vascular: No hyperdense vessel or unexpected calcification. Skull: No acute calvarial abnormality. Sinuses/Orbits: Visualized paranasal sinuses and mastoids clear. Orbital soft tissues unremarkable. Other: None IMPRESSION: Atrophy, chronic microvascular disease. No acute intracranial abnormality. Electronically Signed   By: Charlett Nose M.D.   On: 05/10/2020 19:26   MR BRAIN WO CONTRAST  Result Date: 05/12/2020 CLINICAL DATA:  Delirium. EXAM: MRI HEAD WITHOUT CONTRAST TECHNIQUE: Multiplanar, multiecho pulse sequences of the brain and surrounding structures were obtained without intravenous contrast. COMPARISON:  Head CT  May 10, 2020; MRI of the brain November 13, 20. FINDINGS: Brain: No acute infarction, hemorrhage, extra-axial collection or mass lesion. Scattered and confluent foci of T2 hyperintensity are seen within the white matter of the cerebral hemispheres. Prominence of the supratentorial ventricles and sylvian fissures with relative preservation of the high convexity sulci, may be seen the setting normal pressure hydrocephalus in the appropriate clinical scenario. Prominent volume loss of the mesial temporal lobes. No significant change from prior MRI. Vascular: Normal flow voids. Skull and upper cervical spine: Normal marrow signal. Sinuses/Orbits: Mucosal thickening the ethmoid cells and sphenoid sinuses. Right lens surgery. Other: Mild bilateral mastoid effusion. IMPRESSION: 1. No acute intracranial abnormality. 2. Prominence of the supratentorial ventricles and Sylvian fissures with relative preservation of the high convexity sulci, may be seen the setting of normal pressure hydrocephalus in the appropriate clinical scenario. 3. Prominent volume loss of the mesial temporal lobes. 4. Moderate chronic small vessel ischemic changes. Electronically Signed   By: Baldemar Lenis M.D.   On: 05/12/2020 00:02   DG CHEST PORT 1 VIEW  Result Date: 05/11/2020 CLINICAL DATA:  Altered mental status. EXAM: PORTABLE CHEST 1 VIEW COMPARISON:  05/03/2020. FINDINGS: Cardiomegaly. No pulmonary venous congestion. Low lung volumes. No focal infiltrate. No pleural effusion or pneumothorax. Degenerative change thoracic spine. IMPRESSION: 1. Cardiomegaly. No pulmonary venous congestion. 2. Low lung volumes. No focal infiltrate. Electronically Signed   By: Maisie Fus  Register   On: 05/11/2020 07:30    Assessment & Plan:   There are no diagnoses linked to this encounter.   No orders of the defined types were placed in this encounter.    Follow-up: No follow-ups on file.  Sonda Primes, MD

## 2020-07-19 NOTE — Assessment & Plan Note (Signed)
Check UA 

## 2020-07-20 ENCOUNTER — Telehealth: Payer: Self-pay | Admitting: Internal Medicine

## 2020-07-20 LAB — URINALYSIS, ROUTINE W REFLEX MICROSCOPIC
Bilirubin Urine: NEGATIVE
Hgb urine dipstick: NEGATIVE
Ketones, ur: NEGATIVE
Nitrite: POSITIVE — AB
RBC / HPF: NONE SEEN (ref 0–?)
Specific Gravity, Urine: 1.01 (ref 1.000–1.030)
Urine Glucose: NEGATIVE
Urobilinogen, UA: 0.2 (ref 0.0–1.0)
pH: >=9 — AB (ref 5.0–8.0)

## 2020-07-20 NOTE — Telephone Encounter (Signed)
FYI   Patient fell this afternoon(1.27.22) attempting to reach for his walker while getting out of the car. Denies any pain or injuries as of now.   Azure from [facility]

## 2020-07-23 NOTE — Telephone Encounter (Signed)
Noted.  Continue to observe

## 2020-07-24 ENCOUNTER — Other Ambulatory Visit: Payer: Self-pay | Admitting: Internal Medicine

## 2020-07-24 MED ORDER — CIPROFLOXACIN HCL 250 MG PO TABS: 250.0000 mg | ORAL_TABLET | Freq: Two times a day (BID) | ORAL | 0 refills | Status: DC

## 2020-08-10 ENCOUNTER — Other Ambulatory Visit: Payer: Self-pay | Admitting: *Deleted

## 2020-08-10 ENCOUNTER — Other Ambulatory Visit: Payer: Self-pay | Admitting: Internal Medicine

## 2020-08-11 ENCOUNTER — Telehealth: Payer: Self-pay | Admitting: Internal Medicine

## 2020-08-11 MED ORDER — BUSPIRONE HCL 7.5 MG PO TABS: 7.5000 mg | ORAL_TABLET | Freq: Two times a day (BID) | ORAL | 5 refills | Status: DC

## 2020-08-11 NOTE — Telephone Encounter (Signed)
[name] w/ [facility] called and wanted to report a fall. The patient fell in the bathroom on 2.15.22. He said that there were no injuries that the patient has a small bruise on the right side of his face. He said that the patients neighbors were able to help him

## 2020-08-15 ENCOUNTER — Other Ambulatory Visit: Payer: Self-pay | Admitting: Internal Medicine

## 2020-08-15 NOTE — Telephone Encounter (Signed)
Noted.  Please watch.  Thanks

## 2020-08-16 ENCOUNTER — Telehealth: Payer: Self-pay | Admitting: Internal Medicine

## 2020-08-16 NOTE — Telephone Encounter (Signed)
Physical Therapist has called stating the patient was going to be discharged but since he is still unsteady they will continue for another 3 weeks and that he will put another order in

## 2020-08-18 NOTE — Telephone Encounter (Signed)
Agree  Thank you

## 2020-08-26 ENCOUNTER — Other Ambulatory Visit: Payer: Self-pay | Admitting: Internal Medicine

## 2020-08-28 ENCOUNTER — Telehealth: Payer: Self-pay | Admitting: Internal Medicine

## 2020-08-28 NOTE — Telephone Encounter (Signed)
Almira w/ [facility] is requesting verbals for PT for 1w8 starting this week. She is also requesting a nurse eval. She said that the patient fell this morning and was taken to the ER, he has a skin tear on his left elbow area. Please advise   Okay to LVM: [phone]

## 2020-08-29 NOTE — Telephone Encounter (Signed)
OK. Thx

## 2020-08-29 NOTE — Telephone Encounter (Signed)
Notified almira w/MD response.Marland KitchenRaechel [name]

## 2020-08-31 ENCOUNTER — Other Ambulatory Visit: Payer: Self-pay | Admitting: Internal Medicine

## 2020-08-31 DIAGNOSIS — G9340 Encephalopathy, unspecified: Secondary | ICD-10-CM

## 2020-08-31 DIAGNOSIS — Z8616 Personal history of COVID-19: Secondary | ICD-10-CM

## 2020-08-31 DIAGNOSIS — F0281 Dementia in other diseases classified elsewhere with behavioral disturbance: Secondary | ICD-10-CM | POA: Diagnosis not present

## 2020-08-31 DIAGNOSIS — R2681 Unsteadiness on feet: Secondary | ICD-10-CM

## 2020-08-31 DIAGNOSIS — M6281 Muscle weakness (generalized): Secondary | ICD-10-CM

## 2020-08-31 DIAGNOSIS — G9341 Metabolic encephalopathy: Secondary | ICD-10-CM | POA: Diagnosis not present

## 2020-08-31 DIAGNOSIS — G20 Parkinson's disease: Secondary | ICD-10-CM | POA: Diagnosis not present

## 2020-09-07 ENCOUNTER — Telehealth: Payer: Self-pay | Admitting: Internal Medicine

## 2020-09-07 NOTE — Telephone Encounter (Signed)
[name] w/ [facility] called and said that the patient has fallen again and he is continuing to fall. He just wanted to make Dr. Posey Rea aware. [name] said that there were no visible sustained injuries. EMS helped him off the floor and they said that the patient did not need to go to urgent care or the ED. Please advise

## 2020-09-08 ENCOUNTER — Other Ambulatory Visit: Payer: Self-pay | Admitting: Internal Medicine

## 2020-09-10 NOTE — Telephone Encounter (Signed)
Noted. Thanks.

## 2020-09-13 ENCOUNTER — Ambulatory Visit: Payer: Medicare Other

## 2020-09-14 ENCOUNTER — Other Ambulatory Visit: Payer: Self-pay | Admitting: Internal Medicine

## 2020-09-20 ENCOUNTER — Ambulatory Visit: Payer: Medicare Other | Admitting: Internal Medicine

## 2020-09-20 ENCOUNTER — Telehealth: Payer: Self-pay | Admitting: Internal Medicine

## 2020-09-20 ENCOUNTER — Other Ambulatory Visit: Payer: Self-pay

## 2020-09-20 ENCOUNTER — Encounter: Payer: Self-pay | Admitting: Internal Medicine

## 2020-09-20 DIAGNOSIS — E538 Deficiency of other specified B group vitamins: Secondary | ICD-10-CM

## 2020-09-20 DIAGNOSIS — F0280 Dementia in other diseases classified elsewhere without behavioral disturbance: Secondary | ICD-10-CM | POA: Diagnosis not present

## 2020-09-20 DIAGNOSIS — R413 Other amnesia: Secondary | ICD-10-CM

## 2020-09-20 DIAGNOSIS — I10 Essential (primary) hypertension: Secondary | ICD-10-CM | POA: Diagnosis not present

## 2020-09-20 DIAGNOSIS — R296 Repeated falls: Secondary | ICD-10-CM | POA: Diagnosis not present

## 2020-09-20 DIAGNOSIS — G20 Parkinson's disease: Secondary | ICD-10-CM

## 2020-09-20 DIAGNOSIS — N401 Enlarged prostate with lower urinary tract symptoms: Secondary | ICD-10-CM

## 2020-09-20 DIAGNOSIS — R350 Frequency of micturition: Secondary | ICD-10-CM

## 2020-09-20 LAB — CBC WITH DIFFERENTIAL/PLATELET
Basophils Absolute: 0 10*3/uL (ref 0.0–0.1)
Basophils Relative: 1.1 % (ref 0.0–3.0)
Eosinophils Absolute: 0 10*3/uL (ref 0.0–0.7)
Eosinophils Relative: 1.2 % (ref 0.0–5.0)
HCT: 34.4 % — ABNORMAL LOW (ref 39.0–52.0)
Hemoglobin: 12.2 g/dL — ABNORMAL LOW (ref 13.0–17.0)
Lymphocytes Relative: 46 % (ref 12.0–46.0)
Lymphs Abs: 1.7 10*3/uL (ref 0.7–4.0)
MCHC: 35.4 g/dL (ref 30.0–36.0)
MCV: 84.9 fl (ref 78.0–100.0)
Monocytes Absolute: 0.3 10*3/uL (ref 0.1–1.0)
Monocytes Relative: 9.2 % (ref 3.0–12.0)
Neutro Abs: 1.6 10*3/uL (ref 1.4–7.7)
Neutrophils Relative %: 42.5 % — ABNORMAL LOW (ref 43.0–77.0)
Platelets: 167 10*3/uL (ref 150.0–400.0)
RBC: 4.05 Mil/uL — ABNORMAL LOW (ref 4.22–5.81)
RDW: 13 % (ref 11.5–15.5)
WBC: 3.7 10*3/uL — ABNORMAL LOW (ref 4.0–10.5)

## 2020-09-20 LAB — COMPREHENSIVE METABOLIC PANEL
ALT: 3 U/L (ref 0–53)
AST: 7 U/L (ref 0–37)
Albumin: 3.8 g/dL (ref 3.5–5.2)
Alkaline Phosphatase: 78 U/L (ref 39–117)
BUN: 21 mg/dL (ref 6–23)
CO2: 32 mEq/L (ref 19–32)
Calcium: 9.2 mg/dL (ref 8.4–10.5)
Chloride: 103 mEq/L (ref 96–112)
Creatinine, Ser: 1.47 mg/dL (ref 0.40–1.50)
GFR: 44.67 mL/min — ABNORMAL LOW (ref >60.00)
Glucose, Bld: 96 mg/dL (ref 70–99)
Potassium: 4 mEq/L (ref 3.5–5.1)
Sodium: 141 mEq/L (ref 135–145)
Total Bilirubin: 1.5 mg/dL — ABNORMAL HIGH (ref 0.2–1.2)
Total Protein: 6.3 g/dL (ref 6.0–8.3)

## 2020-09-20 LAB — TSH: TSH: 2.12 u[IU]/mL (ref 0.35–4.50)

## 2020-09-20 MED ORDER — QUETIAPINE FUMARATE 50 MG PO TABS: 25.0000 mg | ORAL_TABLET | Freq: Every day | ORAL | 5 refills | Status: DC

## 2020-09-20 NOTE — Assessment & Plan Note (Signed)
Dementia is worse Add Seroquel 

## 2020-09-20 NOTE — Assessment & Plan Note (Signed)
Low BP D/c BP meds

## 2020-09-20 NOTE — Assessment & Plan Note (Signed)
Worse. Low BP. D/c BP meds

## 2020-09-20 NOTE — Assessment & Plan Note (Signed)
On B12 

## 2020-09-20 NOTE — Assessment & Plan Note (Signed)
Proscar, Cardura and Flomax - all stopped due to falls

## 2020-09-20 NOTE — Addendum Note (Signed)
Addended by: Madelon Lips on: 09/20/2020 11:05 AM   Modules accepted: Orders

## 2020-09-20 NOTE — Telephone Encounter (Signed)
LVM for pt to rtn my call to r/s appt with [facility] on 09/28/20. I also have called and spoken to patients wife trying to r/s. She has asked me to call her back more then 3 x.

## 2020-09-20 NOTE — Assessment & Plan Note (Signed)
Worse Seroquel

## 2020-09-20 NOTE — Progress Notes (Signed)
Subjective:  Patient ID: [name], male    DOB: 1940/08/15  Age: 80 y.o. MRN: [id number]  CC: Medication Problem (Pt/wife want to discuss BP med. She states his BP has been running low. He has not been taking the Carvedilol x's 1 week.//Also c/o pf Hallucinations and not being able to sleep)   HPI [name] presents for HTN, dementia C/o low BP - off BP meds x 1 week; falling a lot C/o hallucinations - seeing people, aggressive at times, no hitting  Outpatient Medications Prior to Visit  Medication Sig Dispense Refill  . Amino Acids-Protein Hydrolys (PRO-STAT MAX PO) Take 30 mLs by mouth in the morning and at bedtime.    [name] aspirin 81 MG chewable tablet Chew 1 tablet (81 mg total) by mouth daily.    . busPIRone (BUSPAR) 10 MG tablet TAKE 1 TABLET BY MOUTH TWICE A DAY 180 tablet 1  . busPIRone (BUSPAR) 7.5 MG tablet TAKE 1 TABLET BY MOUTH 2 TIMES DAILY. 180 tablet 1  . carbidopa-levodopa (SINEMET IR) 25-100 MG tablet Take 1 tablet three times a day with meals 90 tablet 11  . Cholecalciferol (VITAMIN D) 1000 UNITS capsule Take 1 capsule (1,000 Units total) by mouth daily. 100 capsule 3  . pantoprazole (PROTONIX) 40 MG tablet Take 40 mg by mouth daily.    . polyethylene glycol (MIRALAX / GLYCOLAX) 17 g packet Take 17 g by mouth as needed.    . rosuvastatin (CRESTOR) 10 MG tablet TAKE 1 TABLET BY MOUTH EVERY DAY 30 tablet 11  . SYRINGE-NEEDLE, DISP, 3 ML (BD ECLIPSE SYRINGE) 25G X 1" 3 ML MISC Use sq as directed 50 each 3  . traZODone (DESYREL) 50 MG tablet Take by mouth.    . vitamin B-12 (CYANOCOBALAMIN) 1000 MCG tablet Take 1,000 mcg by mouth daily.    . vitamin C (ASCORBIC ACID) 250 MG tablet Take 250 mg by mouth daily.    . carvedilol (COREG) 3.125 MG tablet Take by mouth 2 (two) times daily. Hold for bp <110 or HR <50 (Patient not taking: Reported on 09/20/2020)    . doxazosin (CARDURA) 4 MG tablet TAKE 1 TABLET BY MOUTH EVERY DAY (Patient not taking: Reported on 09/20/2020) 90  tablet 3  . ciprofloxacin (CIPRO) 250 MG tablet Take 1 tablet (250 mg total) by mouth 2 (two) times daily. (Patient not taking: Reported on 09/20/2020) 20 tablet 0   No facility-administered medications prior to visit.    ROS: Review of Systems  Objective:  BP 140/82 (BP Location: Left Arm)   Pulse (!) 58   Temp 97.8 F (36.6 C) (Oral)   Ht 5\' 8"  (1.727 m)   Wt 158 lb (71.7 kg)   SpO2 98%   BMI 24.02 kg/m   BP Readings from Last 3 Encounters:  09/20/20 140/82  07/19/20 124/84  06/12/20 135/74    Wt Readings from Last 3 Encounters:  09/20/20 158 lb (71.7 kg)  07/19/20 159 lb 9.6 oz (72.4 kg)  05/31/20 173 lb 8 oz (78.7 kg)    Physical Exam  Lab Results  Component Value Date   WBC 5.2 05/25/2020   HGB 9.3 (L) 05/25/2020   HCT 26.8 (L) 05/25/2020   PLT 269 05/25/2020   GLUCOSE 101 (H) 07/19/2020   CHOL 139 05/05/2020   TRIG 52 05/05/2020   HDL 39 (L) 05/05/2020   LDLDIRECT 114.0 12/30/2016   LDLCALC 90 05/05/2020   ALT 6 07/19/2020   AST 10 07/19/2020  NA 141 07/19/2020   K 3.6 07/19/2020   CL 104 07/19/2020   CREATININE 1.57 (H) 07/19/2020   BUN 24 (H) 07/19/2020   CO2 33 (H) 07/19/2020   TSH 1.87 07/19/2020   PSA 0.31 08/04/2018   INR 1.4 (H) 07/03/2019   HGBA1C 5.4 05/05/2020   MICROALBUR 2.9 (H) 12/30/2016    CT Head Wo Contrast  Result Date: 05/10/2020 CLINICAL DATA:  Mental status changes EXAM: CT HEAD WITHOUT CONTRAST TECHNIQUE: Contiguous axial images were obtained from the base of the skull through the vertex without intravenous contrast. COMPARISON:  05/03/2020 FINDINGS: Brain: There is atrophy and chronic small vessel disease changes. No acute intracranial abnormality. Specifically, no hemorrhage, hydrocephalus, mass lesion, acute infarction, or significant intracranial injury. Vascular: No hyperdense vessel or unexpected calcification. Skull: No acute calvarial abnormality. Sinuses/Orbits: Visualized paranasal sinuses and mastoids clear.  Orbital soft tissues unremarkable. Other: None IMPRESSION: Atrophy, chronic microvascular disease. No acute intracranial abnormality. Electronically Signed   By: Charlett Nose M.D.   On: 05/10/2020 19:26   MR BRAIN WO CONTRAST  Result Date: 05/12/2020 CLINICAL DATA:  Delirium. EXAM: MRI HEAD WITHOUT CONTRAST TECHNIQUE: Multiplanar, multiecho pulse sequences of the brain and surrounding structures were obtained without intravenous contrast. COMPARISON:  Head CT May 10, 2020; MRI of the brain November 13, 20. FINDINGS: Brain: No acute infarction, hemorrhage, extra-axial collection or mass lesion. Scattered and confluent foci of T2 hyperintensity are seen within the white matter of the cerebral hemispheres. Prominence of the supratentorial ventricles and sylvian fissures with relative preservation of the high convexity sulci, may be seen the setting normal pressure hydrocephalus in the appropriate clinical scenario. Prominent volume loss of the mesial temporal lobes. No significant change from prior MRI. Vascular: Normal flow voids. Skull and upper cervical spine: Normal marrow signal. Sinuses/Orbits: Mucosal thickening the ethmoid cells and sphenoid sinuses. Right lens surgery. Other: Mild bilateral mastoid effusion. IMPRESSION: 1. No acute intracranial abnormality. 2. Prominence of the supratentorial ventricles and Sylvian fissures with relative preservation of the high convexity sulci, may be seen the setting of normal pressure hydrocephalus in the appropriate clinical scenario. 3. Prominent volume loss of the mesial temporal lobes. 4. Moderate chronic small vessel ischemic changes. Electronically Signed   By: Baldemar Lenis M.D.   On: 05/12/2020 00:02   DG CHEST PORT 1 VIEW  Result Date: 05/11/2020 CLINICAL DATA:  Altered mental status. EXAM: PORTABLE CHEST 1 VIEW COMPARISON:  05/03/2020. FINDINGS: Cardiomegaly. No pulmonary venous congestion. Low lung volumes. No focal infiltrate. No  pleural effusion or pneumothorax. Degenerative change thoracic spine. IMPRESSION: 1. Cardiomegaly. No pulmonary venous congestion. 2. Low lung volumes. No focal infiltrate. Electronically Signed   By: Maisie Fus  Register   On: 05/11/2020 07:30    Assessment & Plan:    Sonda Primes, MD

## 2020-09-20 NOTE — Assessment & Plan Note (Signed)
Dementia is worse Add Seroquel

## 2020-09-21 ENCOUNTER — Telehealth: Payer: Self-pay

## 2020-09-21 NOTE — Telephone Encounter (Signed)
Pt's wife said they forgot to mention it, but pt has been having chronic neck pain & shoulder bilat & would like to know if there is something an be done.  Pt sometimes has problems standing up straight.

## 2020-09-22 NOTE — Telephone Encounter (Signed)
Pt's wife notified & Dr Posey Rea response & able to repeat back instructions.  Denies further ques/concerns at this time.

## 2020-09-22 NOTE — Telephone Encounter (Signed)
Use a sports cream topically. Okay to give Tylenol 650 mg 3 times a day as needed pain Use heat or ice Thanks

## 2020-09-25 ENCOUNTER — Telehealth: Payer: Self-pay | Admitting: Internal Medicine

## 2020-09-25 NOTE — Telephone Encounter (Signed)
Noted. Thanks.

## 2020-09-25 NOTE — Telephone Encounter (Signed)
Almira w/ [facility] called to report a fall that the patient had this morning. She said that the patient was sitting in his left chair. He fell on his wife. She said that their next door neighbor helped them up. There were no injuries from the fall.

## 2020-09-28 ENCOUNTER — Ambulatory Visit: Payer: Medicare Other

## 2020-09-28 ENCOUNTER — Ambulatory Visit: Payer: Medicare Other | Admitting: Internal Medicine

## 2020-10-11 ENCOUNTER — Telehealth: Payer: Self-pay | Admitting: Emergency Medicine

## 2020-10-11 NOTE — Telephone Encounter (Signed)
Noted. Pls watch

## 2020-10-11 NOTE — Telephone Encounter (Signed)
[name] with [facility] called team health 10/10/2020 at 6:43pm. Caller stated patient has fallen twice within 2 days with no injuries.

## 2020-10-16 ENCOUNTER — Telehealth: Payer: Self-pay | Admitting: Internal Medicine

## 2020-10-16 NOTE — Telephone Encounter (Signed)
Noted. Thx.

## 2020-10-16 NOTE — Telephone Encounter (Signed)
Received call from Roselyn Meier (Physical therapy assistant - home health).  Called to report two recent falls.  Mr Dulworth fell Friday 10/13/20 and Sunday 10/15/20.  States Mr Paynter has had several falls recently.  Denies any known head injury.  Reported no pain with his visit today.  No bruising noted.  No change in his mental status.  Vitals:  97.3, 65, 18, 109/60 (with normal bp 110-120 systolic readings).  Pulse ox: 96%.  Called and spoke to Ms Jeanbaptiste.  Reports Mr Chavira has no acute injury or pain.  Denies any head injury.  Eating.  Has appt with Dr Posey Rea tomorrow.  Plans to discuss his recent falls and Ms Misuraca expresses need for help at home (therapist mentioned possible palliative care).

## 2020-10-17 ENCOUNTER — Other Ambulatory Visit: Payer: Self-pay

## 2020-10-17 ENCOUNTER — Ambulatory Visit: Payer: Medicare Other | Admitting: Internal Medicine

## 2020-10-17 ENCOUNTER — Encounter: Payer: Self-pay | Admitting: Internal Medicine

## 2020-10-17 VITALS — BP 108/52 | HR 65 | Temp 98.5°F

## 2020-10-17 DIAGNOSIS — G20 Parkinson's disease: Secondary | ICD-10-CM | POA: Diagnosis not present

## 2020-10-17 DIAGNOSIS — I700 Atherosclerosis of aorta: Secondary | ICD-10-CM | POA: Diagnosis not present

## 2020-10-17 DIAGNOSIS — G20A1 Parkinson's disease without dyskinesia, without mention of fluctuations: Secondary | ICD-10-CM

## 2020-10-17 DIAGNOSIS — R296 Repeated falls: Secondary | ICD-10-CM | POA: Diagnosis not present

## 2020-10-17 MED ORDER — CARBIDOPA-LEVODOPA 25-100 MG PO TABS: 2.0000 | ORAL_TABLET | Freq: Three times a day (TID) | ORAL | 11 refills | Status: DC

## 2020-10-17 NOTE — Patient Instructions (Signed)
[facility] [address] [place], [place] [address] Click for driving directions.  Phone: [phone] Fax: [phone] Have a question? Submit it now. We have answers.  Hours Monday - Thursday: 8:30 AM - 5:00 PM Friday - Sunday: Office Closed

## 2020-10-17 NOTE — Assessment & Plan Note (Signed)
Cont w/Crestor 

## 2020-10-17 NOTE — Assessment & Plan Note (Addendum)
Worse Will try to increase Sinemet 25/100 to 2 tabs tid gradually Start PT/OT at home

## 2020-10-17 NOTE — Assessment & Plan Note (Signed)
Worse PT/OT at home Discussed options

## 2020-10-17 NOTE — Progress Notes (Signed)
Subjective:  Patient ID: [name], male    DOB: 09/12/1940  Age: 80 y.o. MRN: [id number]  CC: Follow-up (3 month f/u- Pt states husband just fell that's why they were late.. he denies any injuries)   HPI [name] presents for Parkinson's, falls, dementia - all worse. Falling more.  Outpatient Medications Prior to Visit  Medication Sig Dispense Refill  . Amino Acids-Protein Hydrolys (PRO-STAT MAX PO) Take 30 mLs by mouth in the morning and at bedtime.    . carbidopa-levodopa (SINEMET IR) 25-100 MG tablet Take 1 tablet three times a day with meals 90 tablet 11  . Cholecalciferol (VITAMIN D) 1000 UNITS capsule Take 1 capsule (1,000 Units total) by mouth daily. 100 capsule 3  . pantoprazole (PROTONIX) 40 MG tablet Take 40 mg by mouth daily.    . polyethylene glycol (MIRALAX / GLYCOLAX) 17 g packet Take 17 g by mouth as needed.    [name] QUEtiapine (SEROQUEL) 50 MG tablet Take 0.5-1 tablets (25-50 mg total) by mouth at bedtime. 30 tablet 5  . rosuvastatin (CRESTOR) 10 MG tablet TAKE 1 TABLET BY MOUTH EVERY DAY 30 tablet 11  . SYRINGE-NEEDLE, DISP, 3 ML (BD ECLIPSE SYRINGE) 25G X 1" 3 ML MISC Use sq as directed 50 each 3  . vitamin B-12 (CYANOCOBALAMIN) 1000 MCG tablet Take 1,000 mcg by mouth daily.    . vitamin C (ASCORBIC ACID) 250 MG tablet Take 250 mg by mouth daily.     No facility-administered medications prior to visit.    ROS: Review of Systems  Constitutional: Negative for appetite change, fatigue and unexpected weight change.  HENT: Negative for congestion, nosebleeds, sneezing, sore throat and trouble swallowing.   Eyes: Negative for itching and visual disturbance.  Respiratory: Negative for cough.   Cardiovascular: Negative for chest pain, palpitations and leg swelling.  Gastrointestinal: Negative for abdominal distention, blood in stool, diarrhea and nausea.  Genitourinary: Negative for frequency and hematuria.  Musculoskeletal: Positive for gait problem. Negative for  back pain, joint swelling and neck pain.  Skin: Positive for wound. Negative for rash.  Neurological: Positive for weakness. Negative for dizziness, tremors and speech difficulty.  Psychiatric/Behavioral: Positive for behavioral problems, confusion, decreased concentration and hallucinations. Negative for agitation, dysphoric mood, sleep disturbance and suicidal ideas. The patient is not nervous/anxious.     Objective:  BP (!) 108/52 (BP Location: Left Arm)   Pulse 65   Temp 98.5 F (36.9 C) (Oral)   SpO2 94%   BP Readings from Last 3 Encounters:  10/17/20 (!) 108/52  09/20/20 140/82  07/19/20 124/84    Wt Readings from Last 3 Encounters:  09/20/20 158 lb (71.7 kg)  07/19/20 159 lb 9.6 oz (72.4 kg)  05/31/20 173 lb 8 oz (78.7 kg)    Physical Exam Constitutional:      General: He is not in acute distress.    Appearance: He is well-developed.     Comments: NAD  Eyes:     Conjunctiva/sclera: Conjunctivae normal.     Pupils: Pupils are equal, round, and reactive to light.  Neck:     Thyroid: No thyromegaly.     Vascular: No JVD.  Cardiovascular:     Rate and Rhythm: Normal rate and regular rhythm.     Heart sounds: Normal heart sounds. No murmur heard. No friction rub. No gallop.   Pulmonary:     Effort: Pulmonary effort is normal. No respiratory distress.     Breath sounds: Normal breath sounds.  No wheezing or rales.  Chest:     Chest wall: No tenderness.  Abdominal:     General: Bowel sounds are normal. There is no distension.     Palpations: Abdomen is soft. There is no mass.     Tenderness: There is no abdominal tenderness. There is no guarding or rebound.  Musculoskeletal:        General: No tenderness. Normal range of motion.     Cervical back: Normal range of motion.  Lymphadenopathy:     Cervical: No cervical adenopathy.  Skin:    General: Skin is warm and dry.     Findings: No rash.  Neurological:     Mental Status: He is alert. He is disoriented.      Cranial Nerves: No cranial nerve deficit.     Motor: Weakness present. No abnormal muscle tone.     Coordination: Coordination abnormal.     Gait: Gait abnormal.     Deep Tendon Reflexes: Reflexes are normal and symmetric.  Psychiatric:        Behavior: Behavior normal.        Thought Content: Thought content normal.        Judgment: Judgment normal.   in a w/c    A total time of >45 minutes was spent preparing to see the patient, reviewing tests.  Also, obtaining history from [name] and performing comprehensive physical exam.  Additionally, counseling the patient's wife regarding the above listed issues.   Finally, documenting clinical information in the health records, coordination of care, educating the patient's wife in our options how to take care of [name].   Lab Results  Component Value Date   WBC 3.7 (L) 09/20/2020   HGB 12.2 (L) 09/20/2020   HCT 34.4 (L) 09/20/2020   PLT 167.0 09/20/2020   GLUCOSE 96 09/20/2020   CHOL 139 05/05/2020   TRIG 52 05/05/2020   HDL 39 (L) 05/05/2020   LDLDIRECT 114.0 12/30/2016   LDLCALC 90 05/05/2020   ALT 3 09/20/2020   AST 7 09/20/2020   NA 141 09/20/2020   K 4.0 09/20/2020   CL 103 09/20/2020   CREATININE 1.47 09/20/2020   BUN 21 09/20/2020   CO2 32 09/20/2020   TSH 2.12 09/20/2020   PSA 0.31 08/04/2018   INR 1.4 (H) 07/03/2019   HGBA1C 5.4 05/05/2020   MICROALBUR 2.9 (H) 12/30/2016    CT Head Wo Contrast  Result Date: 05/10/2020 CLINICAL DATA:  Mental status changes EXAM: CT HEAD WITHOUT CONTRAST TECHNIQUE: Contiguous axial images were obtained from the base of the skull through the vertex without intravenous contrast. COMPARISON:  05/03/2020 FINDINGS: Brain: There is atrophy and chronic small vessel disease changes. No acute intracranial abnormality. Specifically, no hemorrhage, hydrocephalus, mass lesion, acute infarction, or significant intracranial injury. Vascular: No hyperdense vessel or unexpected calcification. Skull: No  acute calvarial abnormality. Sinuses/Orbits: Visualized paranasal sinuses and mastoids clear. Orbital soft tissues unremarkable. Other: None IMPRESSION: Atrophy, chronic microvascular disease. No acute intracranial abnormality. Electronically Signed   By: Charlett Nose M.D.   On: 05/10/2020 19:26   MR BRAIN WO CONTRAST  Result Date: 05/12/2020 CLINICAL DATA:  Delirium. EXAM: MRI HEAD WITHOUT CONTRAST TECHNIQUE: Multiplanar, multiecho pulse sequences of the brain and surrounding structures were obtained without intravenous contrast. COMPARISON:  Head CT May 10, 2020; MRI of the brain November 13, 20. FINDINGS: Brain: No acute infarction, hemorrhage, extra-axial collection or mass lesion. Scattered and confluent foci of T2 hyperintensity are seen within the white matter of  the cerebral hemispheres. Prominence of the supratentorial ventricles and sylvian fissures with relative preservation of the high convexity sulci, may be seen the setting normal pressure hydrocephalus in the appropriate clinical scenario. Prominent volume loss of the mesial temporal lobes. No significant change from prior MRI. Vascular: Normal flow voids. Skull and upper cervical spine: Normal marrow signal. Sinuses/Orbits: Mucosal thickening the ethmoid cells and sphenoid sinuses. Right lens surgery. Other: Mild bilateral mastoid effusion. IMPRESSION: 1. No acute intracranial abnormality. 2. Prominence of the supratentorial ventricles and Sylvian fissures with relative preservation of the high convexity sulci, may be seen the setting of normal pressure hydrocephalus in the appropriate clinical scenario. 3. Prominent volume loss of the mesial temporal lobes. 4. Moderate chronic small vessel ischemic changes. Electronically Signed   By: Baldemar Lenis M.D.   On: 05/12/2020 00:02   DG CHEST PORT 1 VIEW  Result Date: 05/11/2020 CLINICAL DATA:  Altered mental status. EXAM: PORTABLE CHEST 1 VIEW COMPARISON:  05/03/2020.  FINDINGS: Cardiomegaly. No pulmonary venous congestion. Low lung volumes. No focal infiltrate. No pleural effusion or pneumothorax. Degenerative change thoracic spine. IMPRESSION: 1. Cardiomegaly. No pulmonary venous congestion. 2. Low lung volumes. No focal infiltrate. Electronically Signed   By: Maisie Fus  Register   On: 05/11/2020 07:30    Assessment & Plan:    Sonda Primes, MD

## 2020-10-20 ENCOUNTER — Other Ambulatory Visit: Payer: Self-pay | Admitting: Internal Medicine

## 2020-10-29 ENCOUNTER — Other Ambulatory Visit: Payer: Self-pay | Admitting: Neurology

## 2020-11-02 ENCOUNTER — Telehealth: Payer: Self-pay | Admitting: Internal Medicine

## 2020-11-02 NOTE — Telephone Encounter (Signed)
   [name] from [facility] agency calling to report patient had a fall today, no injury

## 2020-11-22 ENCOUNTER — Telehealth: Payer: Self-pay | Admitting: Internal Medicine

## 2020-11-22 NOTE — Telephone Encounter (Signed)
Almira w/ [facility] is requesting verbals for a medical social worker eval for community resources. Please advise   Okay to LVM: [phone]

## 2020-11-24 NOTE — Telephone Encounter (Signed)
OK. Thx

## 2020-11-28 NOTE — Telephone Encounter (Signed)
Verbal order given "Almira w/ [facility] is requesting verbals for a medical social worker eval for community resources."  Made clear with verbal repeat back that we are still waiting for verbals for the nursing eval.

## 2020-11-28 NOTE — Telephone Encounter (Signed)
Almira w/ [facility] is requesting verbals for a nursing eval. She said that the patient has urine odor and is requesting a urine analysis and urine culture. Please advise    Phone: [phone]

## 2020-11-29 NOTE — Telephone Encounter (Signed)
Okay.  Thanks.

## 2020-11-29 NOTE — Telephone Encounter (Signed)
Notified Nino Glow w/ MD response for nursing eval../lmb

## 2020-11-30 ENCOUNTER — Telehealth: Payer: Self-pay | Admitting: Internal Medicine

## 2020-11-30 NOTE — Telephone Encounter (Signed)
Clydie Braun w/ [facility] called to report that the patient fell yesterday and received a minor scrap on his left shoulder. She said that one of their nurses is seeing him. Please advise    Phone: [phone]

## 2020-12-01 NOTE — Telephone Encounter (Signed)
Called pt s/p fall. LVM instructing pt to call office if he felt like he needed medical advice.

## 2020-12-05 ENCOUNTER — Telehealth: Payer: Self-pay | Admitting: Internal Medicine

## 2020-12-05 NOTE — Telephone Encounter (Signed)
Jillyn Hidden with [facility] [phone] wants to relay info to Dr. Posey Rea that PT [name] DOB [birth year] fell four times since the last time he was there to give him physical therapy; the dates are June 9th, June 11th, June 12, and today June 13th. PT has a Hx of falling and is falling more often. PT does not need to be referred for care today.

## 2020-12-06 ENCOUNTER — Encounter: Payer: Self-pay | Admitting: Internal Medicine

## 2020-12-06 ENCOUNTER — Other Ambulatory Visit: Payer: Self-pay | Admitting: Neurology

## 2020-12-06 NOTE — Telephone Encounter (Signed)
Noted! Thank you

## 2020-12-08 ENCOUNTER — Other Ambulatory Visit: Payer: Self-pay | Admitting: Internal Medicine

## 2020-12-10 ENCOUNTER — Other Ambulatory Visit: Payer: Self-pay | Admitting: Neurology

## 2020-12-12 ENCOUNTER — Telehealth: Payer: Self-pay | Admitting: Neurology

## 2021-01-04 ENCOUNTER — Telehealth: Payer: Self-pay | Admitting: Internal Medicine

## 2021-01-04 NOTE — Telephone Encounter (Signed)
Noted. Thx.

## 2021-01-04 NOTE — Telephone Encounter (Signed)
   Jillyn Hidden from [employer] [employer] [phone]  Calling to report patient  had a recent fall on 7/12 at home, no injury, sore elbow, no need for ED visit

## 2021-01-10 ENCOUNTER — Telehealth: Payer: Self-pay | Admitting: Internal Medicine

## 2021-01-10 NOTE — Telephone Encounter (Signed)
[facility] has requested that would like [facility] to follow for palliative care  [phone] option 2  Staci or Angelique Blonder

## 2021-01-11 ENCOUNTER — Telehealth: Payer: Self-pay

## 2021-01-11 NOTE — Telephone Encounter (Signed)
Spoke with patient's wife [name] and scheduled an in-person Palliative Consult for 01/31/21 @ 8:30 AM with Dr. Bufford Spikes. Documentation will be noted in [facility]'s EMR [facility].  COVID screening was negative. No pets in home. Patient lives with wife.  Consent obtained; updated Outlook/Netsmart/Team List and Epic.   Family is aware they may be receiving a call from NP the day before or day of to confirm appointment.

## 2021-01-18 NOTE — Telephone Encounter (Signed)
MD sign form has been faxed back to [facility].Marland KitchenRaechel Chute

## 2021-01-18 NOTE — Telephone Encounter (Signed)
-----   Message from Don Broach sent at 01/15/2021  3:58 PM EDT ----- Olin Pia care referral

## 2021-01-24 DIAGNOSIS — Z8616 Personal history of COVID-19: Secondary | ICD-10-CM | POA: Diagnosis not present

## 2021-01-24 DIAGNOSIS — M6281 Muscle weakness (generalized): Secondary | ICD-10-CM | POA: Diagnosis not present

## 2021-01-24 DIAGNOSIS — R2681 Unsteadiness on feet: Secondary | ICD-10-CM | POA: Diagnosis not present

## 2021-01-24 DIAGNOSIS — G20 Parkinson's disease: Secondary | ICD-10-CM | POA: Diagnosis not present

## 2021-01-25 ENCOUNTER — Telehealth: Payer: Self-pay | Admitting: Internal Medicine

## 2021-01-25 NOTE — Telephone Encounter (Signed)
Lauren- [facility]  Patient insurance has changed.. PT has to discharge & readmit patient in order to continue PT  Would like provider or nurse to callback to give verbal order  Callback [phone]

## 2021-01-26 NOTE — Telephone Encounter (Signed)
Okay with me.  Thank you °

## 2021-01-26 NOTE — Telephone Encounter (Signed)
Spoken with Lauren- [facility] and was able to giver verbal OK per Dr. Posey Rea.

## 2021-01-29 DIAGNOSIS — G20 Parkinson's disease: Secondary | ICD-10-CM | POA: Diagnosis not present

## 2021-01-29 DIAGNOSIS — G9349 Other encephalopathy: Secondary | ICD-10-CM | POA: Diagnosis not present

## 2021-01-29 DIAGNOSIS — I5032 Chronic diastolic (congestive) heart failure: Secondary | ICD-10-CM | POA: Diagnosis not present

## 2021-01-29 DIAGNOSIS — D638 Anemia in other chronic diseases classified elsewhere: Secondary | ICD-10-CM | POA: Diagnosis not present

## 2021-01-29 DIAGNOSIS — N1831 Chronic kidney disease, stage 3a: Secondary | ICD-10-CM | POA: Diagnosis not present

## 2021-01-30 ENCOUNTER — Telehealth: Payer: Self-pay | Admitting: *Deleted

## 2021-01-30 NOTE — Telephone Encounter (Signed)
-----   Message from [place] sent at 01/26/2021  3:55 PM EDT ----- For review

## 2021-01-30 NOTE — Telephone Encounter (Signed)
Faxed order back to [facility].Marland KitchenRaechel Chute

## 2021-01-31 DIAGNOSIS — R296 Repeated falls: Secondary | ICD-10-CM | POA: Diagnosis not present

## 2021-01-31 DIAGNOSIS — Z515 Encounter for palliative care: Secondary | ICD-10-CM | POA: Diagnosis not present

## 2021-01-31 DIAGNOSIS — E43 Unspecified severe protein-calorie malnutrition: Secondary | ICD-10-CM | POA: Diagnosis not present

## 2021-01-31 DIAGNOSIS — G20 Parkinson's disease: Secondary | ICD-10-CM | POA: Diagnosis not present

## 2021-02-01 ENCOUNTER — Other Ambulatory Visit: Payer: Self-pay | Admitting: Internal Medicine

## 2021-02-01 DIAGNOSIS — N1831 Chronic kidney disease, stage 3a: Secondary | ICD-10-CM | POA: Diagnosis not present

## 2021-02-01 DIAGNOSIS — D638 Anemia in other chronic diseases classified elsewhere: Secondary | ICD-10-CM | POA: Diagnosis not present

## 2021-02-01 DIAGNOSIS — G20 Parkinson's disease: Secondary | ICD-10-CM | POA: Diagnosis not present

## 2021-02-01 DIAGNOSIS — G9349 Other encephalopathy: Secondary | ICD-10-CM | POA: Diagnosis not present

## 2021-02-01 DIAGNOSIS — I5032 Chronic diastolic (congestive) heart failure: Secondary | ICD-10-CM | POA: Diagnosis not present

## 2021-02-02 NOTE — Telephone Encounter (Signed)
Tammy from [facility] has called in regards to the patient needing an OV with [place] to discuss why home health is needed for their purposes. The pt was last seen in April in the office but that is too old for them to use.   Please advise

## 2021-02-04 DIAGNOSIS — R2689 Other abnormalities of gait and mobility: Secondary | ICD-10-CM | POA: Diagnosis not present

## 2021-02-04 DIAGNOSIS — Z9181 History of falling: Secondary | ICD-10-CM | POA: Diagnosis not present

## 2021-02-04 DIAGNOSIS — I69928 Other speech and language deficits following unspecified cerebrovascular disease: Secondary | ICD-10-CM | POA: Diagnosis not present

## 2021-02-04 DIAGNOSIS — G9340 Encephalopathy, unspecified: Secondary | ICD-10-CM | POA: Diagnosis not present

## 2021-02-06 DIAGNOSIS — N1831 Chronic kidney disease, stage 3a: Secondary | ICD-10-CM | POA: Diagnosis not present

## 2021-02-06 DIAGNOSIS — D638 Anemia in other chronic diseases classified elsewhere: Secondary | ICD-10-CM | POA: Diagnosis not present

## 2021-02-06 DIAGNOSIS — I5032 Chronic diastolic (congestive) heart failure: Secondary | ICD-10-CM | POA: Diagnosis not present

## 2021-02-06 DIAGNOSIS — G20 Parkinson's disease: Secondary | ICD-10-CM | POA: Diagnosis not present

## 2021-02-06 DIAGNOSIS — G9349 Other encephalopathy: Secondary | ICD-10-CM | POA: Diagnosis not present

## 2021-02-09 ENCOUNTER — Emergency Department (HOSPITAL_COMMUNITY): Payer: Medicare Other

## 2021-02-09 ENCOUNTER — Inpatient Hospital Stay (HOSPITAL_COMMUNITY)
Admission: EM | Admit: 2021-02-09 | Discharge: 2021-02-18 | DRG: 682 | Disposition: A | Discharge status: Hospice | Payer: Medicare Other | Attending: Internal Medicine | Admitting: Internal Medicine

## 2021-02-09 ENCOUNTER — Encounter (HOSPITAL_COMMUNITY): Payer: Self-pay | Admitting: Internal Medicine

## 2021-02-09 DIAGNOSIS — E43 Unspecified severe protein-calorie malnutrition: Secondary | ICD-10-CM | POA: Diagnosis not present

## 2021-02-09 DIAGNOSIS — R8281 Pyuria: Secondary | ICD-10-CM | POA: Diagnosis present

## 2021-02-09 DIAGNOSIS — Z20822 Contact with and (suspected) exposure to covid-19: Secondary | ICD-10-CM | POA: Diagnosis present

## 2021-02-09 DIAGNOSIS — R4020 Unspecified coma: Secondary | ICD-10-CM | POA: Diagnosis not present

## 2021-02-09 DIAGNOSIS — Z7189 Other specified counseling: Secondary | ICD-10-CM

## 2021-02-09 DIAGNOSIS — G20 Parkinson's disease: Secondary | ICD-10-CM | POA: Diagnosis present

## 2021-02-09 DIAGNOSIS — D631 Anemia in chronic kidney disease: Secondary | ICD-10-CM | POA: Diagnosis present

## 2021-02-09 DIAGNOSIS — Z789 Other specified health status: Secondary | ICD-10-CM | POA: Diagnosis not present

## 2021-02-09 DIAGNOSIS — E785 Hyperlipidemia, unspecified: Secondary | ICD-10-CM | POA: Diagnosis not present

## 2021-02-09 DIAGNOSIS — I130 Hypertensive heart and chronic kidney disease with heart failure and stage 1 through stage 4 chronic kidney disease, or unspecified chronic kidney disease: Secondary | ICD-10-CM | POA: Diagnosis not present

## 2021-02-09 DIAGNOSIS — I110 Hypertensive heart disease with heart failure: Secondary | ICD-10-CM | POA: Diagnosis not present

## 2021-02-09 DIAGNOSIS — Z886 Allergy status to analgesic agent status: Secondary | ICD-10-CM

## 2021-02-09 DIAGNOSIS — R1310 Dysphagia, unspecified: Secondary | ICD-10-CM | POA: Diagnosis present

## 2021-02-09 DIAGNOSIS — I5032 Chronic diastolic (congestive) heart failure: Secondary | ICD-10-CM | POA: Diagnosis present

## 2021-02-09 DIAGNOSIS — I499 Cardiac arrhythmia, unspecified: Secondary | ICD-10-CM | POA: Diagnosis not present

## 2021-02-09 DIAGNOSIS — N179 Acute kidney failure, unspecified: Secondary | ICD-10-CM | POA: Diagnosis not present

## 2021-02-09 DIAGNOSIS — E872 Acidosis, unspecified: Secondary | ICD-10-CM | POA: Diagnosis present

## 2021-02-09 DIAGNOSIS — I959 Hypotension, unspecified: Principal | ICD-10-CM | POA: Diagnosis present

## 2021-02-09 DIAGNOSIS — N1831 Chronic kidney disease, stage 3a: Secondary | ICD-10-CM | POA: Diagnosis not present

## 2021-02-09 DIAGNOSIS — Z681 Body mass index (BMI) 19 or less, adult: Secondary | ICD-10-CM | POA: Diagnosis not present

## 2021-02-09 DIAGNOSIS — Z66 Do not resuscitate: Secondary | ICD-10-CM | POA: Diagnosis not present

## 2021-02-09 DIAGNOSIS — G9340 Encephalopathy, unspecified: Secondary | ICD-10-CM | POA: Diagnosis present

## 2021-02-09 DIAGNOSIS — R638 Other symptoms and signs concerning food and fluid intake: Secondary | ICD-10-CM | POA: Diagnosis not present

## 2021-02-09 DIAGNOSIS — D638 Anemia in other chronic diseases classified elsewhere: Secondary | ICD-10-CM | POA: Diagnosis not present

## 2021-02-09 DIAGNOSIS — I10 Essential (primary) hypertension: Secondary | ICD-10-CM | POA: Diagnosis not present

## 2021-02-09 DIAGNOSIS — R404 Transient alteration of awareness: Secondary | ICD-10-CM | POA: Diagnosis not present

## 2021-02-09 DIAGNOSIS — E860 Dehydration: Secondary | ICD-10-CM | POA: Diagnosis present

## 2021-02-09 DIAGNOSIS — Z711 Person with feared health complaint in whom no diagnosis is made: Secondary | ICD-10-CM

## 2021-02-09 DIAGNOSIS — F0281 Dementia in other diseases classified elsewhere with behavioral disturbance: Secondary | ICD-10-CM | POA: Diagnosis present

## 2021-02-09 DIAGNOSIS — Z7401 Bed confinement status: Secondary | ICD-10-CM | POA: Diagnosis not present

## 2021-02-09 DIAGNOSIS — B9620 Unspecified Escherichia coli [E. coli] as the cause of diseases classified elsewhere: Secondary | ICD-10-CM | POA: Diagnosis present

## 2021-02-09 DIAGNOSIS — Z888 Allergy status to other drugs, medicaments and biological substances status: Secondary | ICD-10-CM

## 2021-02-09 DIAGNOSIS — R569 Unspecified convulsions: Secondary | ICD-10-CM | POA: Diagnosis not present

## 2021-02-09 DIAGNOSIS — Z515 Encounter for palliative care: Secondary | ICD-10-CM

## 2021-02-09 DIAGNOSIS — Z743 Need for continuous supervision: Secondary | ICD-10-CM | POA: Diagnosis not present

## 2021-02-09 DIAGNOSIS — R64 Cachexia: Secondary | ICD-10-CM | POA: Diagnosis present

## 2021-02-09 DIAGNOSIS — E876 Hypokalemia: Secondary | ICD-10-CM | POA: Diagnosis present

## 2021-02-09 DIAGNOSIS — G9349 Other encephalopathy: Secondary | ICD-10-CM | POA: Diagnosis not present

## 2021-02-09 DIAGNOSIS — R6889 Other general symptoms and signs: Secondary | ICD-10-CM | POA: Diagnosis not present

## 2021-02-09 DIAGNOSIS — R4182 Altered mental status, unspecified: Secondary | ICD-10-CM | POA: Diagnosis not present

## 2021-02-09 DIAGNOSIS — Z882 Allergy status to sulfonamides status: Secondary | ICD-10-CM

## 2021-02-09 DIAGNOSIS — R58 Hemorrhage, not elsewhere classified: Secondary | ICD-10-CM | POA: Diagnosis not present

## 2021-02-09 DIAGNOSIS — I469 Cardiac arrest, cause unspecified: Secondary | ICD-10-CM | POA: Diagnosis not present

## 2021-02-09 DIAGNOSIS — Z87891 Personal history of nicotine dependence: Secondary | ICD-10-CM

## 2021-02-09 DIAGNOSIS — G9341 Metabolic encephalopathy: Secondary | ICD-10-CM | POA: Diagnosis not present

## 2021-02-09 DIAGNOSIS — Z833 Family history of diabetes mellitus: Secondary | ICD-10-CM

## 2021-02-09 DIAGNOSIS — R778 Other specified abnormalities of plasma proteins: Secondary | ICD-10-CM | POA: Diagnosis not present

## 2021-02-09 DIAGNOSIS — Z880 Allergy status to penicillin: Secondary | ICD-10-CM

## 2021-02-09 HISTORY — DX: Parkinson's disease without dyskinesia, without mention of fluctuations: G20.A1

## 2021-02-09 HISTORY — DX: Parkinson's disease: G20

## 2021-02-09 LAB — I-STAT CHEM 8, ED
BUN: 30 mg/dL — ABNORMAL HIGH (ref 8–23)
Calcium, Ion: 1.03 mmol/L — ABNORMAL LOW (ref 1.15–1.40)
Chloride: 102 mmol/L (ref 98–111)
Creatinine, Ser: 1.8 mg/dL — ABNORMAL HIGH (ref 0.61–1.24)
Glucose, Bld: 110 mg/dL — ABNORMAL HIGH (ref 70–99)
HCT: 25 % — ABNORMAL LOW (ref 39.0–52.0)
Hemoglobin: 8.5 g/dL — ABNORMAL LOW (ref 13.0–17.0)
Potassium: 2.5 mmol/L — CL (ref 3.5–5.1)
Sodium: 141 mmol/L (ref 135–145)
TCO2: 27 mmol/L (ref 22–32)

## 2021-02-09 LAB — CBC WITH DIFFERENTIAL/PLATELET
Abs Immature Granulocytes: 0.02 10*3/uL (ref 0.00–0.07)
Basophils Absolute: 0 10*3/uL (ref 0.0–0.1)
Basophils Relative: 0 %
Eosinophils Absolute: 0 10*3/uL (ref 0.0–0.5)
Eosinophils Relative: 0 %
HCT: 27.7 % — ABNORMAL LOW (ref 39.0–52.0)
Hemoglobin: 9 g/dL — ABNORMAL LOW (ref 13.0–17.0)
Immature Granulocytes: 0 %
Lymphocytes Relative: 10 %
Lymphs Abs: 0.5 10*3/uL — ABNORMAL LOW (ref 0.7–4.0)
MCH: 28.5 pg (ref 26.0–34.0)
MCHC: 32.5 g/dL (ref 30.0–36.0)
MCV: 87.7 fL (ref 80.0–100.0)
Monocytes Absolute: 0.2 10*3/uL (ref 0.1–1.0)
Monocytes Relative: 5 %
Neutro Abs: 4.6 10*3/uL (ref 1.7–7.7)
Neutrophils Relative %: 85 %
Platelets: 163 10*3/uL (ref 150–400)
RBC: 3.16 MIL/uL — ABNORMAL LOW (ref 4.22–5.81)
RDW: 12.1 % (ref 11.5–15.5)
WBC: 5.4 10*3/uL (ref 4.0–10.5)
nRBC: 0 % (ref 0.0–0.2)

## 2021-02-09 LAB — MAGNESIUM: Magnesium: 1.9 mg/dL (ref 1.7–2.4)

## 2021-02-09 LAB — COMPREHENSIVE METABOLIC PANEL
ALT: <5 U/L (ref 0–44)
AST: 8 U/L — ABNORMAL LOW (ref 15–41)
Albumin: 2.3 g/dL — ABNORMAL LOW (ref 3.5–5.0)
Alkaline Phosphatase: 88 U/L (ref 38–126)
Anion gap: 10 (ref 5–15)
BUN: 33 mg/dL — ABNORMAL HIGH (ref 8–23)
CO2: 28 mmol/L (ref 22–32)
Calcium: 8 mg/dL — ABNORMAL LOW (ref 8.9–10.3)
Chloride: 104 mmol/L (ref 98–111)
Creatinine, Ser: 2.1 mg/dL — ABNORMAL HIGH (ref 0.61–1.24)
GFR, Estimated: 31 mL/min — ABNORMAL LOW (ref >60)
Glucose, Bld: 118 mg/dL — ABNORMAL HIGH (ref 70–99)
Potassium: 2.6 mmol/L — CL (ref 3.5–5.1)
Sodium: 142 mmol/L (ref 135–145)
Total Bilirubin: 1.9 mg/dL — ABNORMAL HIGH (ref 0.3–1.2)
Total Protein: 4.8 g/dL — ABNORMAL LOW (ref 6.5–8.1)

## 2021-02-09 LAB — LIPASE, BLOOD: Lipase: 24 U/L (ref 11–51)

## 2021-02-09 LAB — TROPONIN I (HIGH SENSITIVITY)
Troponin I (High Sensitivity): 41 ng/L — ABNORMAL HIGH (ref <18)
Troponin I (High Sensitivity): 50 ng/L — ABNORMAL HIGH (ref <18)

## 2021-02-09 LAB — CK: Total CK: 20 U/L — ABNORMAL LOW (ref 49–397)

## 2021-02-09 LAB — LACTIC ACID, PLASMA
Lactic Acid, Venous: 3 mmol/L (ref 0.5–1.9)
Lactic Acid, Venous: 1.7 mmol/L (ref 0.5–1.9)

## 2021-02-09 LAB — PHOSPHORUS: Phosphorus: 2.8 mg/dL (ref 2.5–4.6)

## 2021-02-09 MED ORDER — POTASSIUM CHLORIDE 10 MEQ/100ML IV SOLN
10.0000 meq | INTRAVENOUS | Status: AC
  Administered 2021-02-09 (×2): 10 meq via INTRAVENOUS
  Filled 2021-02-09: qty 100

## 2021-02-09 MED ORDER — POTASSIUM CHLORIDE 10 MEQ/100ML IV SOLN
10.0000 meq | Freq: Once | INTRAVENOUS | Status: AC
  Administered 2021-02-09: 10 meq via INTRAVENOUS
  Filled 2021-02-09: qty 100

## 2021-02-09 MED ORDER — ACETAMINOPHEN 325 MG PO TABS
650.0000 mg | ORAL_TABLET | Freq: Four times a day (QID) | ORAL | Status: DC | PRN
  Administered 2021-02-12 – 2021-02-16 (×2): 650 mg via ORAL
  Filled 2021-02-09 (×2): qty 2

## 2021-02-09 MED ORDER — SODIUM CHLORIDE 0.9 % IV BOLUS
1000.0000 mL | Freq: Once | INTRAVENOUS | Status: AC
  Administered 2021-02-09: 1000 mL via INTRAVENOUS

## 2021-02-09 MED ORDER — LACTATED RINGERS IV SOLN: INTRAVENOUS | Status: AC

## 2021-02-09 MED ORDER — ACETAMINOPHEN 650 MG RE SUPP: 650.0000 mg | Freq: Four times a day (QID) | RECTAL | Status: DC | PRN

## 2021-02-09 NOTE — ED Triage Notes (Signed)
PT BIB [facility] from home d/t unresponsive episode.  EMS found pt sitting in his chair somnolent with a SBP of 42 and a GCS of 3.  EMS reports family stated he had been sitting there all day like that.  When laying on stretcher BP was 112/72.  CBG of 120.    He is a full code at this time but some discussion was had between EMS and family about palliative care.

## 2021-02-09 NOTE — Progress Notes (Signed)
Brief note regarding preliminary plan, with full H&P to follow:  80 year old male who is being admitted for acute encephalopathy in the setting of acute kidney injury, dehydration, presenting with increased somnolence over the last few days.  COVID-19 screen as well as urinalysis currently pending, but does not appear septic at this time.  will closely monitor for results of the studies and order additional IV fluids and close monitoring of ensuing renal function.  N.p.o. for now until mental status improves sufficiently that the patient can participate in and pass nursing bedside swallow screen.  Check UDS.    Newton Pigg, DO Hospitalist

## 2021-02-09 NOTE — ED Notes (Signed)
Pt cleaned up of bowel movement. Peri-care performed. Condom cath and brief placed on patient.

## 2021-02-09 NOTE — H&P (Signed)
History and Physical    PLEASE NOTE THAT DRAGON DICTATION SOFTWARE WAS USED IN THE CONSTRUCTION OF THIS NOTE.   BONNER LARUE YDX:[id number] DOB: Apr 14, 1941 DOA: 02/09/2021  PCP: Cassandria Anger, MD Patient coming from: home   I have personally briefly reviewed patient's old medical records in [place]  Chief Complaint: Altered mental status  HPI: [name] is a 80 y.o. male with medical history significant for chronic diastolic heart failure, chronic anemia with baseline hemoglobin 9-11, hyperlipidemia, advanced Parkinson's disease with dementia, stage IIIa chronic kidney disease with baseline creatinine of 1.5, who is admitted to [facility] on 02/09/2021 with acute encephalopathy after presenting from home to [facility] ED for evaluation of altered mental status.  In the setting of the patient's altered mental status and associated inability to contribute to the history at this time, the following history is obtained via my discussions with the patient's wife, who was present at bedside, in addition to my discussions with the EDP as well as via chart review.  The patient's wife reports that the patient has exhibited increased somnolence and diminished responsiveness over the course of the last week on a progressive basis, noting that he has been sleeping significantly longer per day that his baseline sleep requirements and more difficult to arouse via verbal stimuli relative to baseline.  She also notes over the course of the last 2 weeks that the patient has essentially stopped eating, with very little food or water consumption over that time, although he has still been attempting to take his outpatient medications.  Initially, this diminished oral intake was on the basis of the patient's report of diminished appetite, although wife reports that his significant and progressive somnolence over the course of the last week is further exacerbated this decline in oral intake.  No  recent trauma. No N/V. No rash.   In the setting of the patient's advanced Parkinson's disease with associated dementia, and prompted by the patient's significant recent decline in oral intake, the patient/his wife met with palliative care as an outpatient approximately 10 days ago with wife reporting recommendation from the palliative care service for consideration of starting hospice.  Wife reports that she is still considering this possibility, but has not yet ready to initiate hospice at this time.  However, she confirms DNR/DNI status, and notes that the patient would not want any form of a feeding tube, even in the event that his appetite oral or oral intake does not subsequently improve.   In the setting of this progressive somnolence, the patient was brought to [facility] emergency department today for further evaluation.   ED Course:  Vital signs in the ED were notable for the following: Tetramex 97.7, heart rate 66-84.  Initial blood pressure 82/54, which is improved to 138/71 following IV fluid bolus as further detailed below, respiratory rate 12-18, oxygen saturation 100% on room air.  Labs were notable for the following: CMP notable for the following: Potassium 2.6, bicarbonate 28, creatinine 2.10 relative to most recent prior value of 1.47 in March 22, glucose 118.  CPK 20.  Initial troponin I found to be 41, with repeat value trending up slightly to 50.  Initial lactate 3.0, which improved to 1.7 following interval IV fluids.  CBC notable for white blood cell count 5400.  Urinalysis ordered, with result currently pending.  Screening COVID-19 PCR ordered in the ED, with result currently pending.  EKG showed sinus rhythm with heart rate 74, normal intervals,  and no evidence of T wave or ST changes.  Chest x-ray showed no evidence of acute cardiopulmonary process, while noncontrast CT head showed no evidence of acute intracranial process, including no evidence of intracranial hemorrhage or  acute ischemic infarct.    While in the ED, the following were administered: Normal saline x1 L bolus, potassium chloride 10 mill equivalents IV over 1 hour x 1 dose.     Review of Systems: As per HPI otherwise 10 point review of systems negative.   Past Medical History:  Diagnosis Date   B12 deficiency 11/19/2018   Benign prostatic hypertrophy    Edema    Hyperlipidemia    Hypertension    Insomnia    Parasomnia 10/14/2011   PSG 12/15/11>>AHI 0.4, SpO2 low 89%, PLMI 16, somnoliloquay, nocturia    Prediabetes 06/10/2012   Seasonal allergies    Sepsis ([place]) 01/2019    Past Surgical History:  Procedure Laterality Date   CYSTOSCOPY/URETEROSCOPY/HOLMIUM LASER/STENT PLACEMENT Left 07/05/2019   Procedure: CYSTOSCOPY/URETEROSCOPY/STENT PLACEMENT/REROGRADE;  Surgeon: Lucas Mallow, MD;  Location: [facility];  Service: Urology;  Laterality: Left;   FINGER SURGERY Right 04/19/2014   RIGHT INDEX & RING FINGER   FROM DOG BITE    I & D EXTREMITY Right 04/20/2014   Procedure: IRRIGATION AND DEBRIDEMENT right index finger and right ring finger;  Surgeon: Roseanne Kaufman, MD;  Location: [place];  Service: Orthopedics;  Laterality: Right;   I & D EXTREMITY Right 04/21/2014   Procedure: IRRIGATION AND DEBRIDEMENT RIGHT HAND RING FINGER, INDEX FINGER AND Flexor Tenolysis and FDS Tenotomy;  Surgeon: Roseanne Kaufman, MD;  Location: [place];  Service: Orthopedics;  Laterality: Right;    Social History:  reports that he has quit smoking. His smoking use included cigarettes and cigars. He has a 7.00 pack-year smoking history. He has never used smokeless tobacco. He reports that he does not currently use alcohol. He reports that he does not use drugs.   Allergies  Allergen Reactions   Amlodipine Other (See Comments)    Edema w/10 mg   Lorazepam Other (See Comments)    DO NOT GIVE BENZO TO [facility] PT WITH CONCERNS FOR LEWY BODY DEMENTIA, CAUSES PARADOXICAL WORSENING OF THEIR AGITATION!   Penicillins  Hives    Has patient had a PCN reaction causing immediate rash, facial/tongue/throat swelling, SOB or lightheadedness with hypotension: Yes Has patient had a PCN reaction causing severe rash involving mucus membranes or skin necrosis: No Has patient had a PCN reaction that required hospitalization No Has patient had a PCN reaction occurring within the last 10 years: No If all of the above answers are "NO", then may proceed with Cephalosporin use.    Sulfonamide Derivatives Hives   Aspirin     bleeding   Atorvastatin     REACTION: leg cramps   Sulfa Antibiotics Other (See Comments) and Palpitations    Sweating, passed out   Telmisartan Other (See Comments)    ?fatigue    Family History  Problem Relation Age of Onset   Diabetes Mother    Tremor Father    Diabetes Other     Prior to Admission medications   Medication Sig Start Date End Date Taking? Authorizing Provider  Amino Acids-Protein Hydrolys (PRO-STAT MAX PO) Take 30 mLs by mouth in the morning and at bedtime.    [provider]  busPIRone (BUSPAR) 10 MG tablet TAKE 1 TABLET BY MOUTH TWICE A DAY 02/02/21   Plotnikov, Evie Lacks, MD  carbidopa-levodopa (  SINEMET IR) 25-100 MG tablet TAKE 1 TABLET BY MOUTH THREE TIMES A DAY WITH MEALS 12/11/20   Cameron Sprang, MD  Cholecalciferol (VITAMIN D) 1000 UNITS capsule Take 1 capsule (1,000 Units total) by mouth daily. 09/09/12   Plotnikov, Evie Lacks, MD  pantoprazole (PROTONIX) 40 MG tablet Take 40 mg by mouth daily. 06/27/20   [provider]  polyethylene glycol (MIRALAX / GLYCOLAX) 17 g packet Take 17 g by mouth as needed.    [provider]  QUEtiapine (SEROQUEL) 50 MG tablet TAKE 0.5-1 TABLETS (25-50 MG TOTAL) BY MOUTH AT BEDTIME. 10/23/20   Plotnikov, Evie Lacks, MD  rivastigmine (EXELON) 1.5 MG capsule TAKE 1 CAPSULE (1.5 MG TOTAL) BY MOUTH 2 (TWO) TIMES DAILY. 12/11/20   Plotnikov, Evie Lacks, MD  rosuvastatin (CRESTOR) 10 MG tablet TAKE 1 TABLET BY MOUTH EVERY  DAY 08/15/20   Plotnikov, Evie Lacks, MD  SYRINGE-NEEDLE, DISP, 3 ML (BD ECLIPSE SYRINGE) 25G X 1" 3 ML MISC Use sq as directed 11/19/18   Plotnikov, Evie Lacks, MD  traZODone (DESYREL) 50 MG tablet TAKE 2 TABLETS BY MOUTH AT BEDTIME 12/11/20   Plotnikov, Evie Lacks, MD  vitamin B-12 (CYANOCOBALAMIN) 1000 MCG tablet Take 1,000 mcg by mouth daily.    [provider]  vitamin C (ASCORBIC ACID) 250 MG tablet Take 250 mg by mouth daily.    [provider]     Objective    Physical Exam: Vitals:   02/09/21 1845 02/09/21 1900 02/09/21 1930 02/09/21 2015  BP: 114/62 117/71 138/71 (!) 162/80  Pulse: 76 78 67 68  Resp: 11 15 (!) 8 18  Temp:      TempSrc:      SpO2: 100% 100% 100% 100%    General: appears to be stated age; somnolent, non-verbal , unable to follow commands.  Skin: warm, dry, no rash Head:  AT/[place] Mouth:  Oral mucosa membranes appear dry, normal dentition Neck: supple; trachea midline Heart:  RRR; did not appreciate any M/R/G Lungs: CTAB, did not appreciate any wheezes, rales, or rhonchi Abdomen: + BS; soft, ND,  Vascular: 2+ pedal pulses b/l; 2+ radial pulses b/l Extremities: no peripheral edema, no muscle wasting Neuro: In the setting of the patient's current mental status and associated inability to follow instructions, unable to perform full neurologic exam at this time.  As such, assessment of strength, sensation, and cranial nerves is limited at this time.    Labs on Admission: I have personally reviewed following labs and imaging studies  CBC: Recent Labs  Lab 02/09/21 1815 02/09/21 1832  WBC 5.4  --   NEUTROABS 4.6  --   HGB 9.0* 8.5*  HCT 27.7* 25.0*  MCV 87.7  --   PLT 163  --    Basic Metabolic Panel: Recent Labs  Lab 02/09/21 1815 02/09/21 1832  NA 142 141  K 2.6* 2.5*  CL 104 102  CO2 28  --   GLUCOSE 118* 110*  BUN 33* 30*  CREATININE 2.10* 1.80*  CALCIUM 8.0*  --   MG 1.9  --   PHOS 2.8  --    GFR: CrCl cannot be  calculated (Unknown ideal weight.). Liver Function Tests: Recent Labs  Lab 02/09/21 1815  AST 8*  ALT <5  ALKPHOS 88  BILITOT 1.9*  PROT 4.8*  ALBUMIN 2.3*   Recent Labs  Lab 02/09/21 1815  LIPASE 24   No results for input(s): AMMONIA in the last 168 hours. Coagulation Profile: No results for  input(s): INR, PROTIME in the last 168 hours. Cardiac Enzymes: Recent Labs  Lab 02/09/21 1815  CKTOTAL 20*   BNP (last 3 results) No results for input(s): PROBNP in the last 8760 hours. HbA1C: No results for input(s): HGBA1C in the last 72 hours. CBG: No results for input(s): GLUCAP in the last 168 hours. Lipid Profile: No results for input(s): CHOL, HDL, LDLCALC, TRIG, CHOLHDL, LDLDIRECT in the last 72 hours. Thyroid Function Tests: No results for input(s): TSH, T4TOTAL, FREET4, T3FREE, THYROIDAB in the last 72 hours. Anemia Panel: No results for input(s): VITAMINB12, FOLATE, FERRITIN, TIBC, IRON, RETICCTPCT in the last 72 hours. Urine analysis:    Component Value Date/Time   COLORURINE YELLOW 07/19/2020 1143   APPEARANCEUR Cloudy (A) 07/19/2020 1143   LABSPEC 1.010 07/19/2020 1143   PHURINE >=9.0 (A) 07/19/2020 1143   GLUCOSEU NEGATIVE 07/19/2020 1143   HGBUR NEGATIVE 07/19/2020 1143   BILIRUBINUR NEGATIVE 07/19/2020 1143   KETONESUR NEGATIVE 07/19/2020 1143   PROTEINUR NEGATIVE 05/15/2020 0814   UROBILINOGEN 0.2 07/19/2020 1143   NITRITE POSITIVE (A) 07/19/2020 1143   LEUKOCYTESUR LARGE (A) 07/19/2020 1143    Radiological Exams on Admission: CT HEAD WO CONTRAST (5MM)  Result Date: 02/09/2021 CLINICAL DATA:  Unknown cause altered mental status. EXAM: CT HEAD WITHOUT CONTRAST TECHNIQUE: Contiguous axial images were obtained from the base of the skull through the vertex without intravenous contrast. COMPARISON:  CT head 05/10/2020 FINDINGS: Brain: Cerebral ventricle sizes are concordant with the degree of cerebral volume loss. Patchy and confluent areas of decreased  attenuation are noted throughout the deep and periventricular white matter of the cerebral hemispheres bilaterally, compatible with chronic microvascular ischemic disease. No evidence of large-territorial acute infarction. No parenchymal hemorrhage. No mass lesion. No extra-axial collection. No mass effect or midline shift. No hydrocephalus. Basilar cisterns are patent. Vascular: No hyperdense vessel. Atherosclerotic calcifications are present within the cavernous internal carotid arteries. Skull: No acute fracture or focal lesion. Sinuses/Orbits: Paranasal sinuses and mastoid air cells are clear. Right lens replacement. Otherwise orbits are unremarkable. Other: None. IMPRESSION: Negative for acute traumatic injury. Electronically Signed   By: Iven Finn M.D.   On: 02/09/2021 19:42   DG Chest Portable 1 View  Result Date: 02/09/2021 CLINICAL DATA:  Altered mental status.  Hypertension. EXAM: PORTABLE CHEST 1 VIEW COMPARISON:  None. FINDINGS: The heart size and mediastinal contours are within normal limits. Ectasia and atherosclerotic calcification of thoracic aorta noted. Both lungs are clear. The visualized skeletal structures are unremarkable. IMPRESSION: No active disease. Electronically Signed   By: Marlaine Hind M.D.   On: 02/09/2021 19:03     EKG: Independently reviewed, with result as described above.    Assessment/Plan   TAVYN KURKA is a 80 y.o. male with medical history significant for chronic diastolic heart failure, chronic anemia with baseline hemoglobin 9-11, hyperlipidemia, advanced Parkinson's disease with dementia, stage IIIa chronic kidney disease with baseline creatinine of 1.5, who is admitted to [facility] on 02/09/2021 with acute encephalopathy after presenting from home to [facility] ED for evaluation of altered mental status.   Principal Problem:   Acute encephalopathy Active Problems:   Dyslipidemia   Hypokalemia   Parkinson's syndrome (HCC)   AKI (acute kidney  injury) (HCC)   Elevated troponin   Hypotension   Lactic acidosis   Chronic diastolic CHF (congestive heart failure) (HCC)     #) Acute encephalopathy: 1 week of progressive somnolence associated with diminished responsiveness over that timeframe, as detailed above, which  is potentially multifactorial in etiology, with suspected contributions from dehydration as well as resultant acute kidney injury.  Potential pharmacologic contribution in the setting of several central acting medications on an outpatient basis, many of which have renal metabolism/excretion, raising the possibility of increased circulating concentrations thereof in the context of presenting acute kidney injury.  No evidence of underlying infectious process at this time, including chest x-ray showing no evidence of acute cardiopulmonary process.  We will follow for result urinalysis, particularly in the setting of patient's increased risk for development of UTI due to history of BPH.  COVID-19 PCR result also currently pending.  Not septic.  No evidence to suggest seizures.  CVA is in the differential, but appears less likely at the present time.  However, if mental status does not demonstrate interval improvement following IV fluid resuscitative efforts as well as improvement in ensuing renal function as well as with washout period of home central acting medications, consider pursuit of MRI brain to further evaluate this possibility.   Plan: Follow for result urinalysis as well as COVID-19 screen.  Lactated Ringer's at 100 cc/h.  Further evaluation management of acute kidney injury, as further detailed below.  Check MMA, TSH, VBG, urinary drug screen.  N.p.o. until patient's mental status would improve sufficiently that he is able to participate in and pass nursing bedside swallow screen.  Holding home medications in this context, including allowing for washout.  Given the presence of several central acting medications.  Fall precautions  ordered.      #) Acute kidney injury superimposed on stage IIIa CKD: In setting of baseline creatinine of 1.5, presenting creatinine found to be 2.10.  This appears to be prerenal in nature in the setting of dehydration as a consequence of wife's report of significant decline in oral intake over the course of the last 2 weeks.  There may also be additional prerenal contribution from decline in renal perfusion as a consequence of presenting hypotension that is subsequently resolved with IV fluids.  Urinalysis with microscopy currently pending.  CPK not elevated.  Plan: Follow for result of urinalysis with microscopy.  Add on random urine sodium as well as random urine creatinine.  Continuous lactated Ringer's.  Monitor strict I's and O's and daily weights.  Tempt avoid nephrotoxic agents.  Repeat BMP in the morning.       #) Elevated troponin: mildly elevated initial troponin of 40, trending up slightly to 51. Suspect that this mildly elevated troponin is on the basis of supply demand mismatch in the setting of diminished systemic perfusion as a consequence of presenting hypotension , as above, as opposed to representing a type I process due to acute plaque rupture. Additionally, relative decline in renal clearance of troponin in the setting of AKI is likely also contributing to presenting troponin elevation. EKG shows no evidence of acute ischemic changes, including no evidence of STEMI, and CXR showed no acute CP process, including no evidence of pneumothorax.  Overall, ACS is felt to be less likely relative to type 2 supply demand mismatch, as above, but will closely monitor on telemetry overnight while treating suspected underlying dehydration, AKI, and initial hypotension, as further described above.     Plan: repeat troponin in the AM. Monitor on telemetry.  Check serum Mg level and repeat BMP in the morning.  IV fluids and further evaluation management of presenting AKI, as above.   Echocardiogram ordered for the morning.      #) Hypotension: Initial blood pressure noted  to be 82/54, which is completely resolved following interval IV fluids, suggestive of a strong contribution from presenting dehydration as result of recent decline in oral intake.  No evidence of underlying infectious process at this time, and criteria for sepsis not currently met.  Will check echocardiogram in the morning, particularly in the setting of presenting elevated troponin, as above.  Of note, presenting hemoglobin found to be consistent with baseline range, as further quantified above.  Plan: Continuous IV fluids, as above.  Monitor strict I's and O's and daily weights.  Follow for result urinalysis and COVID-19 screen.  Echocardiogram in the morning.  Monitor on telemetry.  Repeat CBC in the morning.     #) Hypokalemia: Initial potassium 2.6.  Received 10 mill equivalents of IV potassium chloride via EDP.  Of note, serum mag found to be 1.9.  This appears to be as a consequence of recent decline in oral intake, as further quantified above.  Plan: Potassium chloride 30 mEq IV over 3 hours x 1 now, in addition to the 10 mill equivalents that were already administered in the ED this evening.  Repeat BMP and serum magnesium levels in the morning.  Monitor on telemetry.  Continuous LR, as above.      #) Lactic acidosis: Initial lactate 3.0, with ensuing improvement to 1.7 following interval IV fluids.  Likely multifactorial, with suspected contributions from diffuse peripheral tissue hypoperfusion in setting of initial hypotension, as well as likely contribution from starvation keto lactic acidosis given significant decline in oral intake over the last 2 weeks as well as likely contribution from AKI.  Criteria not met for sepsis at this time.  Plan: Further evaluation management of initial hypotension as well as dehydration/AKI, as above.  Continuous lactated Ringer's, as above.  Check INR.  Repeat  CMP and CBC In the morning.  Check VBG as a component of evaluation for presenting acute encephalopathy.      #) Chronic anemia: Associated baseline hemoglobin 9-11, with presenting hemoglobin found to be consistent with with this range.  No evidence of active bleed at this time.  Plan: Repeat CBC in the morning.       #) Chronic diastolic heart failure: Most recent echocardiogram in November 2021 notable for LVEF 55 to 60% with mild LVH, grade 1 diastolic dysfunction, mildly dilated left atrium and no evidence of significant valvular pathology.  Not on any diuretic medications at home.  No clinical or radiographic evidence to suggest acutely decompensated heart failure.  Close monitoring of ensuing volume status in the context of plan for continuous IV fluids overnight as management for dehydration and prerenal AKI, as further detailed above.  Plan: Monitor strict I's and O's and daily weights.  Repeat BMP and serum magnesium level in the morning.  Of note, echocardiogram has been ordered for the morning to further evaluate presenting elevated troponin, as described above.       #) Vitamin B12 deficiency: Documented history of such, on daily oral B12 supplementation at home.  In setting of presenting acute encephalopathy, will check MMA level.  Plan: Check MMA level.  Repeat CBC in the morning.       #) Upper lipidemia: On Crestor as an outpatient.    Plan: In setting of current n.p.o. status, hold home statin for now.         #) Parkinson's disease with dementia: Documented history of such, with current outpatient pharmacologic regimen noted to be consisting of Sinemet as well as rivastigmine, with  her wife reporting no recent modifications to this regimen or associated doses.   Plan: Holding home parkinsonian medication for now in the setting of current n.p.o. status, pending nursing bedside swallow screen following ensuing improvement in patient's  somnolence.     DVT prophylaxis: SCDs Code Status: DNR/DNI, as confirmed per my discussions with the patient's wife, POA, who was present at bedside. Family Communication: Case discussed with the patient's wife, as above Disposition Plan: Per Rounding Team Consults called: none  Admission status: Inpatient; pcu     Of note, this patient was added by me to the following Admit List/Treatment Team: [email or web address].      PLEASE NOTE THAT DRAGON DICTATION SOFTWARE WAS USED IN THE CONSTRUCTION OF THIS NOTE.   [facility] Pager [phone] From [place]  Otherwise, please contact night-coverage  [email or web address] Password [facility]   02/09/2021, 8:39 PM

## 2021-02-09 NOTE — ED Provider Notes (Signed)
[facility] EMERGENCY DEPARTMENT Provider Note   CSN: [id number] Arrival date & time: 02/09/21  1805     History No chief complaint on file.   [name] is a 80 y.o. male.  HPI  Patient is an 80 year old male with a past medical history of CHF, anemia, HLD, parkinson's disease, dementia, CKD that is presenting for altered mental status. Patient is only able to say his name. History was obtained from EMS and his wife. EMS reported that patient had decreased responsiveness today compared to normal.   Patient's wife reports that the patient has had decreased responsiveness and poor PO intake over the last week. He has been more somnolent. He has been sleeping more than normal. He has had no recent trauma or falls. He has had no fevers, chills, or illnesses.        Past Medical History:  Diagnosis Date   B12 deficiency 11/19/2018   Benign prostatic hypertrophy    Edema    Hyperlipidemia    Hypertension    Insomnia    Parasomnia 10/14/2011   PSG 12/15/11>>AHI 0.4, SpO2 low 89%, PLMI 16, somnoliloquay, nocturia    Prediabetes 06/10/2012   Seasonal allergies    Sepsis (HCC) 01/2019    Patient Active Problem List   Diagnosis Date Noted   Atherosclerosis of aorta (HCC) 10/17/2020   History of CVA (cerebrovascular accident) 05/11/2020   Acute encephalopathy 05/10/2020   AMS (altered mental status) 05/04/2020   Syncope 05/03/2020   Neutropenia (HCC) 05/03/2020   Thrombocytopenia (HCC) 05/03/2020   COVID-19 virus infection 05/03/2020   Shingles 11/17/2019   Hydronephrosis with renal and ureteral calculus obstruction 07/06/2019   Constipation 07/03/2019   Acute metabolic encephalopathy 07/03/2019   CRF (chronic renal failure), stage 3 (moderate) 03/18/2019   Pressure injury of skin 02/14/2019   ARF (acute renal failure) (HCC) 02/13/2019   Sepsis (HCC) 02/13/2019   Protein-calorie malnutrition, severe (HCC) 02/13/2019   Dementia due to Parkinson's  disease without behavioral disturbance (HCC) 02/13/2019   Parkinson's syndrome (HCC) 02/13/2019   Infected sebaceous cyst 12/04/2018   Hallucinations 11/19/2018   Shoulder pain 11/19/2018   Vitamin B12 deficiency 11/19/2018   Glaucoma suspect with open angle 11/03/2018   Memory loss 09/21/2018   Falls frequently 09/21/2018   Fatigue 10/27/2017   Edema 07/29/2017   Chronic venous insufficiency 06/30/2017   Cellulitis and abscess of leg, except foot 06/30/2017   Ear pain, referred, right 06/10/2016   Urinary frequency 04/16/2016   Cerumen impaction 08/22/2015   Infection of right hand due to bite 04/20/2014   Hemifacial spasm 09/15/2013   Well adult exam 07/16/2013   Dermatochalasis of eyelid 01/01/2012   Other facial nerve disorders 01/01/2012   Myogenic ptosis 11/04/2011   Twitching 11/04/2011   Non-REM Parasomnia 10/14/2011   Dog bite of left hand 08/19/2011   Wound infection 08/19/2011   Hypokalemia 12/10/2010   Hyperglycemia 12/10/2010   Dyslipidemia    Benign prostatic hyperplasia    LEG CRAMPS 03/07/2009   OTITIS EXTERNA 08/30/2008   Prostatitis 08/30/2008   Essential hypertension 08/26/2007   ERECTILE DYSFUNCTION 08/26/2007    Past Surgical History:  Procedure Laterality Date   CYSTOSCOPY/URETEROSCOPY/HOLMIUM LASER/STENT PLACEMENT Left 07/05/2019   Procedure: CYSTOSCOPY/URETEROSCOPY/STENT PLACEMENT/REROGRADE;  Surgeon: Crista Elliot, MD;  Location: [facility];  Service: Urology;  Laterality: Left;   FINGER SURGERY Right 04/19/2014   RIGHT INDEX & RING FINGER   FROM DOG BITE    I & D  EXTREMITY Right 04/20/2014   Procedure: IRRIGATION AND DEBRIDEMENT right index finger and right ring finger;  Surgeon: Dominica SeverinWilliam Gramig, MD;  [facility];  Service: Orthopedics;  Laterality: Right;   I & D EXTREMITY Right 04/21/2014   Procedure: IRRIGATION AND DEBRIDEMENT RIGHT HAND RING FINGER, INDEX FINGER AND Flexor Tenolysis and FDS Tenotomy;  Surgeon: Dominica SeverinWilliam Gramig, MD;   Location: [facility] OR;  Service: Orthopedics;  Laterality: Right;       Family History  Problem Relation Age of Onset   Diabetes Mother    Tremor Father    Diabetes Other     Social History   Tobacco Use   Smoking status: Former    Packs/day: 1.00    Years: 7.00    Pack years: 7.00    Types: Cigarettes, Cigars   Smokeless tobacco: Never   Tobacco comments:    cigars day/ 1-2       QIUIT SMOKING IN 2000  Vaping Use   Vaping Use: Never used  Substance Use Topics   Alcohol use: Not Currently    Comment: OCCASSIONAL WINE -1 glass/month   Drug use: No    Home Medications Prior to Admission medications   Medication Sig Start Date End Date Taking? Authorizing Provider  Amino Acids-Protein Hydrolys (PRO-STAT MAX PO) Take 30 mLs by mouth in the morning and at bedtime.    [provider]  busPIRone (BUSPAR) 10 MG tablet TAKE 1 TABLET BY MOUTH TWICE A DAY 02/02/21   Plotnikov, Georgina QuintAleksei V, MD  carbidopa-levodopa (SINEMET IR) 25-100 MG tablet TAKE 1 TABLET BY MOUTH THREE TIMES A DAY WITH MEALS 12/11/20   Van ClinesAquino, Karen M, MD  Cholecalciferol (VITAMIN D) 1000 UNITS capsule Take 1 capsule (1,000 Units total) by mouth daily. 09/09/12   Plotnikov, Georgina QuintAleksei V, MD  pantoprazole (PROTONIX) 40 MG tablet Take 40 mg by mouth daily. 06/27/20   [provider]  polyethylene glycol (MIRALAX / GLYCOLAX) 17 g packet Take 17 g by mouth as needed.    [provider]  QUEtiapine (SEROQUEL) 50 MG tablet TAKE 0.5-1 TABLETS (25-50 MG TOTAL) BY MOUTH AT BEDTIME. 10/23/20   Plotnikov, Georgina QuintAleksei V, MD  rivastigmine (EXELON) 1.5 MG capsule TAKE 1 CAPSULE (1.5 MG TOTAL) BY MOUTH 2 (TWO) TIMES DAILY. 12/11/20   Plotnikov, Georgina QuintAleksei V, MD  rosuvastatin (CRESTOR) 10 MG tablet TAKE 1 TABLET BY MOUTH EVERY DAY 08/15/20   Plotnikov, Georgina QuintAleksei V, MD  SYRINGE-NEEDLE, DISP, 3 ML (BD ECLIPSE SYRINGE) 25G X 1" 3 ML MISC Use sq as directed 11/19/18   Plotnikov, Georgina QuintAleksei V, MD  traZODone (DESYREL) 50 MG tablet TAKE 2  TABLETS BY MOUTH AT BEDTIME 12/11/20   Plotnikov, Georgina QuintAleksei V, MD  vitamin B-12 (CYANOCOBALAMIN) 1000 MCG tablet Take 1,000 mcg by mouth daily.    [provider]  vitamin C (ASCORBIC ACID) 250 MG tablet Take 250 mg by mouth daily.    [provider]    Allergies    Amlodipine, Lorazepam, Penicillins, Sulfonamide derivatives, Aspirin, Atorvastatin, Sulfa antibiotics, and Telmisartan  Review of Systems   Review of Systems  Unable to perform ROS: Dementia   Physical Exam Updated Vital Signs There were no vitals taken for this visit.  Physical Exam Vitals and nursing note reviewed.  Constitutional:      General: He is not in acute distress.    Appearance: He is ill-appearing.  HENT:     Head: Normocephalic and atraumatic.     Right Ear: External ear normal.  Left Ear: External ear normal.     Nose: Nose normal. No congestion.     Mouth/Throat:     Mouth: Mucous membranes are moist.     Pharynx: Oropharynx is clear. No oropharyngeal exudate or posterior oropharyngeal erythema.  Eyes:     General: No visual field deficit.    Extraocular Movements: Extraocular movements intact.     Conjunctiva/sclera: Conjunctivae normal.     Pupils: Pupils are equal, round, and reactive to light.  Cardiovascular:     Rate and Rhythm: Normal rate and regular rhythm.     Pulses: Normal pulses.     Heart sounds: Normal heart sounds. No murmur heard.   No friction rub. No gallop.  Pulmonary:     Effort: Pulmonary effort is normal. No respiratory distress.     Breath sounds: Normal breath sounds. No stridor. No wheezing, rhonchi or rales.  Chest:     Chest wall: No tenderness.  Abdominal:     General: Abdomen is flat. Bowel sounds are normal. There is no distension.     Palpations: Abdomen is soft.     Tenderness: There is no abdominal tenderness. There is no right CVA tenderness, left CVA tenderness, guarding or rebound.  Musculoskeletal:        General: No swelling or  tenderness. Normal range of motion.     Cervical back: Normal range of motion and neck supple. No rigidity, tenderness or bony tenderness.     Thoracic back: Normal. No tenderness or bony tenderness.     Lumbar back: Normal. No tenderness or bony tenderness.     Right lower leg: No edema.     Left lower leg: No edema.  Skin:    General: Skin is warm and dry.  Neurological:     General: No focal deficit present.     Cranial Nerves: Cranial nerves are intact. No cranial nerve deficit, dysarthria or facial asymmetry.     Sensory: Sensation is intact. No sensory deficit.     Motor: Motor function is intact. No weakness.    ED Results / Procedures / Treatments   Labs (all labs ordered are listed, but only abnormal results are displayed) Labs Reviewed - No data to display  EKG None  Radiology No results found.  Procedures Procedures   Medications Ordered in ED Medications - No data to display  ED Course  I have reviewed the triage vital signs and the nursing notes.  Pertinent labs & imaging results that were available during my care of the patient were reviewed by me and considered in my medical decision making (see chart for details).    MDM Rules/Calculators/A&P                          [name] is a 80 y.o. male  with a past medical history of CHF, anemia, HLD, parkinson's disease, dementia, CKD that is presenting for altered mental status. On arrival patient had a GCS of 13. He was hypotensive and given IV fluids on arrival. His blood pressures improved. Patient is able to say his name, open his eyes and make purposeful movements. Patient is ill appearing, cachetic and malnourished on exam. Wife reports a week of poor po intake and increased somnolence.   I discussed code status with his wife and she reports that he is DNR/DNI. She states that he would not want a feeding tube.   Due to change in mental status, altered mental  status workup started. CT head showed no  acute intracranial abnormality. CXR showed no acute cardiopulmonary process. His lactic acid was elevated which downtrended after IV fluids. CBC was notable for hemoglobin of 9 which is consistent with his baseline. CMP was notable for a potassium of 2.6. He was given IV potassium. He has an AKI with an increase in his creatinine. His EKG showed no signs of ischemia. His initial troponin was 41.   Patient was admitted in the setting of hypotension that was fluid responsive, altered mental status and dehydration.   The plan for this patient was discussed with Dr. Stevie Kern, who voiced agreement and who oversaw evaluation and treatment of this patient.   Final Clinical Impression(s) / ED Diagnoses Final diagnoses:  Hypotension, unspecified hypotension type  Altered mental status, unspecified altered mental status type    Rx / DC Orders ED Discharge Orders     None        Lottie Dawson, MD 02/10/21 1231    Milagros Loll, MD 02/10/21 [phone]

## 2021-02-09 NOTE — ED Notes (Signed)
The pt opened his eyes wide then closed them  Iv not running as it should keeps alarming occluded

## 2021-02-10 ENCOUNTER — Inpatient Hospital Stay (HOSPITAL_COMMUNITY): Payer: Medicare Other

## 2021-02-10 DIAGNOSIS — I959 Hypotension, unspecified: Secondary | ICD-10-CM | POA: Diagnosis present

## 2021-02-10 DIAGNOSIS — R778 Other specified abnormalities of plasma proteins: Secondary | ICD-10-CM | POA: Diagnosis not present

## 2021-02-10 DIAGNOSIS — Z515 Encounter for palliative care: Secondary | ICD-10-CM

## 2021-02-10 DIAGNOSIS — Z789 Other specified health status: Secondary | ICD-10-CM

## 2021-02-10 DIAGNOSIS — I5032 Chronic diastolic (congestive) heart failure: Secondary | ICD-10-CM | POA: Diagnosis present

## 2021-02-10 DIAGNOSIS — E872 Acidosis, unspecified: Secondary | ICD-10-CM | POA: Diagnosis present

## 2021-02-10 DIAGNOSIS — Z66 Do not resuscitate: Secondary | ICD-10-CM

## 2021-02-10 DIAGNOSIS — G9340 Encephalopathy, unspecified: Secondary | ICD-10-CM | POA: Diagnosis not present

## 2021-02-10 DIAGNOSIS — Z711 Person with feared health complaint in whom no diagnosis is made: Secondary | ICD-10-CM

## 2021-02-10 DIAGNOSIS — G20 Parkinson's disease: Secondary | ICD-10-CM | POA: Diagnosis not present

## 2021-02-10 DIAGNOSIS — Z7189 Other specified counseling: Secondary | ICD-10-CM

## 2021-02-10 DIAGNOSIS — N179 Acute kidney failure, unspecified: Secondary | ICD-10-CM | POA: Diagnosis present

## 2021-02-10 LAB — I-STAT VENOUS BLOOD GAS, ED
Acid-Base Excess: 7 mmol/L — ABNORMAL HIGH (ref 0.0–2.0)
Bicarbonate: 29.9 mmol/L — ABNORMAL HIGH (ref 20.0–28.0)
Calcium, Ion: 1.05 mmol/L — ABNORMAL LOW (ref 1.15–1.40)
HCT: 19 % — ABNORMAL LOW (ref 39.0–52.0)
Hemoglobin: 6.5 g/dL — CL (ref 13.0–17.0)
O2 Saturation: 99 %
Potassium: 3 mmol/L — ABNORMAL LOW (ref 3.5–5.1)
Sodium: 142 mmol/L (ref 135–145)
TCO2: 31 mmol/L (ref 22–32)
pCO2, Ven: 32.2 mmHg — ABNORMAL LOW (ref 44.0–60.0)
pH, Ven: 7.576 — ABNORMAL HIGH (ref 7.250–7.430)
pO2, Ven: 117 mmHg — ABNORMAL HIGH (ref 32.0–45.0)

## 2021-02-10 LAB — FERRITIN: Ferritin: 518 ng/mL — ABNORMAL HIGH (ref 24–336)

## 2021-02-10 LAB — RESP PANEL BY RT-PCR (FLU A&B, COVID) ARPGX2
Influenza A by PCR: NEGATIVE
Influenza B by PCR: NEGATIVE
SARS Coronavirus 2 by RT PCR: NEGATIVE

## 2021-02-10 LAB — CBC WITH DIFFERENTIAL/PLATELET
Abs Immature Granulocytes: 0.02 10*3/uL (ref 0.00–0.07)
Basophils Absolute: 0 10*3/uL (ref 0.0–0.1)
Basophils Relative: 0 %
Eosinophils Absolute: 0 10*3/uL (ref 0.0–0.5)
Eosinophils Relative: 0 %
HCT: 22.8 % — ABNORMAL LOW (ref 39.0–52.0)
Hemoglobin: 7.8 g/dL — ABNORMAL LOW (ref 13.0–17.0)
Immature Granulocytes: 0 %
Lymphocytes Relative: 22 %
Lymphs Abs: 1.1 10*3/uL (ref 0.7–4.0)
MCH: 28.9 pg (ref 26.0–34.0)
MCHC: 34.2 g/dL (ref 30.0–36.0)
MCV: 84.4 fL (ref 80.0–100.0)
Monocytes Absolute: 0.3 10*3/uL (ref 0.1–1.0)
Monocytes Relative: 5 %
Neutro Abs: 3.7 10*3/uL (ref 1.7–7.7)
Neutrophils Relative %: 73 %
Platelets: 135 10*3/uL — ABNORMAL LOW (ref 150–400)
RBC: 2.7 MIL/uL — ABNORMAL LOW (ref 4.22–5.81)
RDW: 12 % (ref 11.5–15.5)
WBC: 5.1 10*3/uL (ref 4.0–10.5)
nRBC: 0 % (ref 0.0–0.2)

## 2021-02-10 LAB — COMPREHENSIVE METABOLIC PANEL
ALT: <5 U/L (ref 0–44)
AST: <5 U/L — ABNORMAL LOW (ref 15–41)
Albumin: 2 g/dL — ABNORMAL LOW (ref 3.5–5.0)
Alkaline Phosphatase: 76 U/L (ref 38–126)
Anion gap: 7 (ref 5–15)
BUN: 34 mg/dL — ABNORMAL HIGH (ref 8–23)
CO2: 27 mmol/L (ref 22–32)
Calcium: 7.6 mg/dL — ABNORMAL LOW (ref 8.9–10.3)
Chloride: 103 mmol/L (ref 98–111)
Creatinine, Ser: 1.95 mg/dL — ABNORMAL HIGH (ref 0.61–1.24)
GFR, Estimated: 34 mL/min — ABNORMAL LOW (ref >60)
Glucose, Bld: 101 mg/dL — ABNORMAL HIGH (ref 70–99)
Potassium: 3.3 mmol/L — ABNORMAL LOW (ref 3.5–5.1)
Sodium: 137 mmol/L (ref 135–145)
Total Bilirubin: 1.6 mg/dL — ABNORMAL HIGH (ref 0.3–1.2)
Total Protein: 4.3 g/dL — ABNORMAL LOW (ref 6.5–8.1)

## 2021-02-10 LAB — URINALYSIS, ROUTINE W REFLEX MICROSCOPIC
Bilirubin Urine: NEGATIVE
Glucose, UA: NEGATIVE mg/dL
Ketones, ur: 5 mg/dL — AB
Nitrite: NEGATIVE
Protein, ur: 30 mg/dL — AB
Specific Gravity, Urine: 1.017 (ref 1.005–1.030)
WBC, UA: >50 WBC/hpf — ABNORMAL HIGH (ref 0–5)
pH: 5 (ref 5.0–8.0)

## 2021-02-10 LAB — ABO/RH: ABO/RH(D): O POS

## 2021-02-10 LAB — RAPID URINE DRUG SCREEN, HOSP PERFORMED
Amphetamines: NOT DETECTED
Barbiturates: NOT DETECTED
Benzodiazepines: NOT DETECTED
Cocaine: NOT DETECTED
Opiates: NOT DETECTED
Tetrahydrocannabinol: NOT DETECTED

## 2021-02-10 LAB — PROTIME-INR
INR: 1.1 (ref 0.8–1.2)
Prothrombin Time: 14.2 seconds (ref 11.4–15.2)

## 2021-02-10 LAB — TROPONIN I (HIGH SENSITIVITY): Troponin I (High Sensitivity): 66 ng/L — ABNORMAL HIGH (ref <18)

## 2021-02-10 LAB — ECHOCARDIOGRAM LIMITED

## 2021-02-10 LAB — IRON AND TIBC
Iron: 97 ug/dL (ref 45–182)
Saturation Ratios: 87 % — ABNORMAL HIGH (ref 17.9–39.5)
TIBC: 112 ug/dL — ABNORMAL LOW (ref 250–450)
UIBC: 15 ug/dL

## 2021-02-10 LAB — PREPARE RBC (CROSSMATCH)

## 2021-02-10 LAB — BRAIN NATRIURETIC PEPTIDE: B Natriuretic Peptide: 66 pg/mL (ref 0.0–100.0)

## 2021-02-10 LAB — CREATININE, URINE, RANDOM: Creatinine, Urine: 170.61 mg/dL

## 2021-02-10 LAB — SODIUM, URINE, RANDOM: Sodium, Ur: <10 mmol/L

## 2021-02-10 LAB — MAGNESIUM: Magnesium: 2 mg/dL (ref 1.7–2.4)

## 2021-02-10 MED ORDER — SODIUM CHLORIDE 0.9% IV SOLUTION: Freq: Once | INTRAVENOUS | Status: AC

## 2021-02-10 MED ORDER — ROSUVASTATIN CALCIUM 5 MG PO TABS
10.0000 mg | ORAL_TABLET | Freq: Every day | ORAL | Status: DC
  Administered 2021-02-10 – 2021-02-13 (×4): 10 mg via ORAL
  Filled 2021-02-10 (×4): qty 2

## 2021-02-10 MED ORDER — LACTATED RINGERS IV SOLN: INTRAVENOUS | Status: AC

## 2021-02-10 MED ORDER — BUSPIRONE HCL 5 MG PO TABS
10.0000 mg | ORAL_TABLET | Freq: Two times a day (BID) | ORAL | Status: DC
  Administered 2021-02-10 – 2021-02-18 (×17): 10 mg via ORAL
  Filled 2021-02-10 (×2): qty 2
  Filled 2021-02-10: qty 1
  Filled 2021-02-10 (×14): qty 2

## 2021-02-10 MED ORDER — TRAZODONE HCL 50 MG PO TABS: 100.0000 mg | ORAL_TABLET | Freq: Every day | ORAL | Status: DC

## 2021-02-10 MED ORDER — RIVASTIGMINE TARTRATE 1.5 MG PO CAPS
1.5000 mg | ORAL_CAPSULE | Freq: Two times a day (BID) | ORAL | Status: DC
  Administered 2021-02-10 – 2021-02-17 (×15): 1.5 mg via ORAL
  Filled 2021-02-10 (×18): qty 1

## 2021-02-10 MED ORDER — VITAMIN B-12 1000 MCG PO TABS
1000.0000 ug | ORAL_TABLET | Freq: Every day | ORAL | Status: DC
  Administered 2021-02-10 – 2021-02-13 (×4): 1000 ug via ORAL
  Filled 2021-02-10 (×4): qty 1

## 2021-02-10 MED ORDER — MIRTAZAPINE 7.5 MG PO TABS
7.5000 mg | ORAL_TABLET | Freq: Every day | ORAL | Status: DC
  Administered 2021-02-10 – 2021-02-17 (×8): 7.5 mg via ORAL
  Filled 2021-02-10 (×9): qty 1

## 2021-02-10 MED ORDER — CARBIDOPA-LEVODOPA 25-100 MG PO TABS
1.0000 | ORAL_TABLET | Freq: Three times a day (TID) | ORAL | Status: DC
  Administered 2021-02-10 – 2021-02-18 (×21): 1 via ORAL
  Filled 2021-02-10 (×22): qty 1

## 2021-02-10 MED ORDER — QUETIAPINE FUMARATE 25 MG PO TABS
25.0000 mg | ORAL_TABLET | Freq: Every day | ORAL | Status: DC
  Administered 2021-02-10 – 2021-02-17 (×8): 50 mg via ORAL
  Filled 2021-02-10 (×9): qty 2

## 2021-02-10 NOTE — ED Notes (Signed)
Pt yelling out, requesting to "get up and get outta here." TV turned on per pt request. Pt verbalizes understanding not to try to get up by himself.

## 2021-02-10 NOTE — Consult Note (Signed)
Consultation Note Date: 02/10/2021   Patient Name: [name]  DOB: 10-17-1940  MRN: [id number]  Age / Sex: 80 y.o., male  PCP: Plotnikov, Evie Lacks, MD Referring Physician: Edwin [name], *  Reason for Consultation: Establishing goals of care  HPI/Patient Profile: 80 y.o. male  with past medical history of chronic diastolic heart failure, chronic anemia, hyperlipidemia, hypertension, BPH, advanced Parkinson's disease with dementia, stage IIIa chronic kidney disease presented to the ED on 02/09/21 from [name] due to an unresponsive episode. The patient's wife reports that the patient has exhibited progressive increased somnolence, diminished responsiveness, with minimal PO intake over the last week. Patient was found to be hypotensive in ED. Patient was admitted on 02/09/2021 with acute encephalopathy, AKI, elevated troponin, hypotension, hypokalemia, lactic acidosis.   Of note, patient was previously seen by PMT in December 2021 and had a recent outpatient Palliative Care visit about 10 days ago with [name].   ED Course:  Vital signs in the ED were notable for the following: Tetramex 97.7, heart rate 66-84.  Initial blood pressure 82/54, which is improved to 138/71 following IV fluid bolus as further detailed below, respiratory rate 12-18, oxygen saturation 100% on room air.   Labs were notable for the following: CMP notable for the following: Potassium 2.6, bicarbonate 28, creatinine 2.10 relative to most recent prior value of 1.47 in March 22, glucose 118.  CPK 20.  Initial troponin I found to be 41, with repeat value trending up slightly to 50.  Initial lactate 3.0, which improved to 1.7 following interval IV fluids.  CBC notable for white blood cell count 5400.  Urinalysis ordered, with result currently pending.  Screening COVID-19 PCR ordered in the ED, with result currently pending.   EKG  showed sinus rhythm with heart rate 74, normal intervals, and no evidence of T wave or ST changes.  Chest x-ray showed no evidence of acute cardiopulmonary process, while noncontrast CT head showed no evidence of acute intracranial process, including no evidence of intracranial hemorrhage or acute ischemic infarct.     While in the ED, the following were administered: Normal saline x1 L bolus, potassium chloride 10 mill equivalents IV over 1 hour x 1 dose.    Clinical Assessment and Goals of Care: I have reviewed medical records including EPIC notes, labs, and imaging. Received report from primary RN - no acute concerns. RN reports patient is intermittently agitated/restless, but otherwise is lethargic. Patient has not had anything to eat or drink.   Went to visit patient at bedside - no family/visitors present. Patient was lying in bed asleep - he does wake to voice/gentle touch and is oriented to self only. He is not able to make complex medical decisions. No signs or non-verbal gestures of pain or discomfort noted. No respiratory distress, increased work of breathing, or secretions noted. Patient denies pain or shortness of breath. He states he is not hungry or thirsty.   Met with patient's wife/[name]  to discuss diagnosis, prognosis, GOC, EOL wishes, disposition,  and options.  I reintroduced Palliative Medicine as specialized medical care for people living with serious illness. It focuses on providing relief from the symptoms and stress of a serious illness. The goal is to improve quality of life for both the patient and the family.  We discussed interval history since patient's last admission in December 2021 as well as functional and nutritional status. After discharge in 2021 patient has lived at [name]. Starting in May 2022, [name] tells me patient has had 5 falls. She stays with the patient every day from 7a-8p and assists him with all ADLs. Patient was able to walk with 1+ person  assistance and a walker. Three weeks ago is when [name] starting recognizing a noticeable decline in the patient - he became increasingly sleepy, holding food in his mouth/stating not hungry. [name] states the patient loves sweets - doughnuts, candy, hotdogs chips - but has not been interested in eating his favorite foods. Therapeutic listening provided as [name] expresses how hard it has been to watch the patient decline - they have been married over 30 years.  We discussed patient's current illness and what it means in the larger context of patient's on-going co-morbidities. [name] understands that Parkinson's disease and dementia are progressive, non-curable, terminal disease underlying the patient's current acute medical conditions. Patient's history is significant for gradually worsening progressive symptoms of dementia/Parkinson's. We discussed that the patient's condition does not seem to be from any reversible factors, but likely a progression of his chronic illnesses. We reviewed decreased oral intake and [name] verified patient's wish not to pursue feeding tube. We discussed that decreased/no oral intake is a poor prognostic indicator and means that patient is likely approaching EOL. Natural disease trajectory and expectations at EOL were discussed. I attempted to elicit values and goals of care important to the patient. The difference between aggressive medical intervention and comfort care was considered in light of the patient's goals of care. [name] requests to meet in person tomorrow for final decisions on patient's care. She states she is having  a hard time, but "knows what's coming." She states she wants the patient to be "comfortable" - reflecting on how "independent and strong" he was before his disease progression. Goal until tomorrow's meeting is to continue conservative medical management. She would like to proceed with blood transfusion knowing it will not likely change his overall  outcome. She confirms DNR/DNI status.   [name] would like to get more information on Hospice at tomorrow's meeting, but is not ready for the information today.  Encouraged [name] to invite anyone else she felt was important to be present to the meeting tomorrow.  Therapeutic listening and emotional support provided throughout visit.   Discussed with patient/family the importance of continued conversation with each other and the medical providers regarding overall plan of care and treatment options, ensuring decisions are within the context of the patient's values and GOCs.    Questions and concerns were addressed. The patient/family was encouraged to call with questions and/or concerns. PMT number was provided.  Primary Decision Maker: Nani Gasser - spouse    SUMMARY OF RECOMMENDATIONS   Continue current conservative medical management. Wife would like to proceed with blood transfusion today Continue DNR/DNI as previously documented Patient's history is significant for gradually worsening progressive symptoms of dementia/Parkinson's and is likely approaching EOL. Patient would be hospice appropriate if/when family decide on comfort path - wife was not ready to make this decision today Wife requests to meet with PMT tomorrow  8/21 to continue GOC/transition to comfort care discussion  - meeting scheduled for 11am Wife is clear goal is not to pursue feeding tube PMT will continue to follow and support holistically   Code Status/Advance Care Planning: DNR  Palliative Prophylaxis:  Aspiration, Bowel Regimen, Delirium Protocol, Frequent Pain Assessment, Oral Care, and Turn Reposition  Additional Recommendations (Limitations, Scope, Preferences): Full Scope Treatment and No Artificial Feeding  Psycho-social/Spiritual:  Desire for further Chaplaincy support:no Created space and opportunity for patient and family to express thoughts and feelings regarding patient's current medical  situation.  Emotional support and therapeutic listening provided.  Prognosis:  < 2 weeks if continues with no oral intake  Discharge Planning: To Be Determined      Primary Diagnoses: Present on Admission:  Acute encephalopathy  AKI (acute kidney injury) ([place])  Elevated troponin  Hypotension  Lactic acidosis  Dyslipidemia  Parkinson's syndrome ([facility])  Hypokalemia  Chronic diastolic CHF (congestive heart failure) ([place])   I have reviewed the medical record, interviewed the patient and family, and examined the patient. The following aspects are pertinent.  Past Medical History:  Diagnosis Date   B12 deficiency 11/19/2018   Benign prostatic hypertrophy    Edema    Hyperlipidemia    Hypertension    Insomnia    Parasomnia 10/14/2011   PSG 12/15/11>>AHI 0.4, SpO2 low 89%, PLMI 16, somnoliloquay, nocturia    Parkinson disease ([place])    with associated dementia   Prediabetes 06/10/2012   Seasonal allergies    Sepsis ([place]) 01/2019   Social History   Socioeconomic History   Marital status: Married    Spouse name: Not on file   Number of children: Not on file   Years of education: Not on file   Highest education level: Not on file  Occupational History   Occupation: Retired  Tobacco Use   Smoking status: Former    Packs/day: 1.00    Years: 7.00    Pack years: 7.00    Types: Cigarettes, Cigars   Smokeless tobacco: Never   Tobacco comments:    cigars day/ 1-2       QIUIT SMOKING IN 2000  Vaping Use   Vaping Use: Never used  Substance and Sexual Activity   Alcohol use: Not Currently    Comment: OCCASSIONAL WINE -1 glass/month   Drug use: No   Sexual activity: Not Currently    Partners: Female  Other Topics Concern   Not on file  Social History Narrative      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, does landscaping   Right handed   Lives at [facility]   Social Determinants of Health   Financial Resource Strain: Not on file  Food Insecurity: Not on file  Transportation  Needs: Not on file  Physical Activity: Not on file  Stress: Not on file  Social Connections: Not on file   Family History  Problem Relation Age of Onset   Diabetes Mother    Tremor Father    Diabetes Other    Scheduled Meds:  busPIRone  10 mg Oral BID   carbidopa-levodopa  1 tablet Oral TID   mirtazapine  7.5 mg Oral QHS   QUEtiapine  25-50 mg Oral QHS   rivastigmine  1.5 mg Oral BID   rosuvastatin  10 mg Oral Daily   vitamin B-12  1,000 mcg Oral Daily   Continuous Infusions:  lactated ringers 100 mL/hr at 02/10/21 1349   PRN Meds:.acetaminophen **OR** acetaminophen Medications Prior to Admission:  Prior to Admission medications   Medication Sig Start Date End Date Taking? Authorizing Provider  busPIRone (BUSPAR) 10 MG tablet TAKE 1 TABLET BY MOUTH TWICE A DAY 02/02/21  Yes Plotnikov, Evie Lacks, MD  carbidopa-levodopa (SINEMET IR) 25-100 MG tablet TAKE 1 TABLET BY MOUTH THREE TIMES A DAY WITH MEALS 12/11/20  Yes Cameron Sprang, MD  Cholecalciferol (VITAMIN D) 1000 UNITS capsule Take 1 capsule (1,000 Units total) by mouth daily. 09/09/12  Yes Plotnikov, Evie Lacks, MD  QUEtiapine (SEROQUEL) 50 MG tablet TAKE 0.5-1 TABLETS (25-50 MG TOTAL) BY MOUTH AT BEDTIME. 10/23/20  Yes Plotnikov, Evie Lacks, MD  rivastigmine (EXELON) 1.5 MG capsule TAKE 1 CAPSULE (1.5 MG TOTAL) BY MOUTH 2 (TWO) TIMES DAILY. 12/11/20  Yes Plotnikov, Evie Lacks, MD  rosuvastatin (CRESTOR) 10 MG tablet TAKE 1 TABLET BY MOUTH EVERY DAY 08/15/20  Yes Plotnikov, Evie Lacks, MD  traZODone (DESYREL) 50 MG tablet TAKE 2 TABLETS BY MOUTH AT BEDTIME 12/11/20  Yes Plotnikov, Evie Lacks, MD  vitamin B-12 (CYANOCOBALAMIN) 1000 MCG tablet Take 1,000 mcg by mouth daily.   Yes [provider]  vitamin C (ASCORBIC ACID) 250 MG tablet Take 250 mg by mouth daily.   Yes [provider]  SYRINGE-NEEDLE, DISP, 3 ML (BD ECLIPSE SYRINGE) 25G X 1" 3 ML MISC Use sq as directed 11/19/18   Plotnikov, Evie Lacks, MD   Allergies   Allergen Reactions   Amlodipine Other (See Comments)    Edema w/10 mg   Lorazepam Other (See Comments)    DO NOT GIVE BENZO TO [facility] PT WITH CONCERNS FOR LEWY BODY DEMENTIA, CAUSES PARADOXICAL WORSENING OF THEIR AGITATION!   Penicillins Hives    Has patient had a PCN reaction causing immediate rash, facial/tongue/throat swelling, SOB or lightheadedness with hypotension: Yes Has patient had a PCN reaction causing severe rash involving mucus membranes or skin necrosis: No Has patient had a PCN reaction that required hospitalization No Has patient had a PCN reaction occurring within the last 10 years: No If all of the above answers are "NO", then may proceed with Cephalosporin use.    Sulfonamide Derivatives Hives   Aspirin     bleeding   Atorvastatin     REACTION: leg cramps   Sulfa Antibiotics Other (See Comments) and Palpitations    Sweating, passed out   Telmisartan Other (See Comments)    ?fatigue   Review of Systems  Unable to perform ROS: Dementia   Physical Exam Vitals and nursing note reviewed.  Constitutional:      General: He is not in acute distress.    Appearance: He is cachectic. He is ill-appearing.  Pulmonary:     Effort: No respiratory distress.  Skin:    General: Skin is warm and dry.  Neurological:     Mental Status: He is lethargic, disoriented and confused.     Motor: Weakness present.  Psychiatric:        Cognition and Memory: Cognition is impaired. Memory is impaired.    Vital Signs: BP (!) 158/80   Pulse 73   Temp 97.7 F (36.5 C) (Oral)   Resp 17   SpO2 98%      Pain Score: 0-No pain   SpO2: SpO2: 98 % O2 Device:SpO2: 98 % O2 Flow Rate: .   IO: Intake/output summary:  Intake/Output Summary (Last 24 hours) at 02/10/2021 1450 Last data filed at 02/10/2021 0900 Gross per 24 hour  Intake 2337.76 ml  Output --  Net 2337.76 ml  LBM:   Baseline Weight:   Most recent weight:       Palliative Assessment/Data: PPS 10%     Time  In: 1415 Time Out: 1545 Time Total: 90 minutes  Greater than 50%  of this time was spent counseling and coordinating care related to the above assessment and plan.  Signed by: Lin Landsman, NP   Please contact Palliative Medicine Team phone at [phone] for questions and concerns.  For individual provider: See Shea Evans

## 2021-02-10 NOTE — ED Notes (Signed)
Admitting MD at bedside assessing pt at this time.  

## 2021-02-10 NOTE — ED Notes (Signed)
Daughter updated on patient status and plan of care by phone at this time.

## 2021-02-10 NOTE — ED Notes (Signed)
Pt calling out requesting to get up and pee. Pt reminded that he has an external catheter in place and cannot get up right now due to weakness. Pt continues to be falling asleep mid-conversation. Urine noted in catheter tubing, appears to be draining adequately.

## 2021-02-10 NOTE — ED Notes (Signed)
Pt found to have pulled out PIV and pulled off condom cath. Pt cleaned up, fresh linens placed on bed. Condom cath reattached. IVF paused. SaO2 monitor placed on toe. Cardiac leads removed to decreased stimulation. Pt repositioned in bed and given warm blanket.

## 2021-02-10 NOTE — Progress Notes (Signed)
  Echocardiogram 2D Echocardiogram has been performed.  Roosvelt Maser F 02/10/2021, 10:37 AM

## 2021-02-10 NOTE — Progress Notes (Signed)
[facility] PROGRESS NOTE    [name]  BJS:[id number] DOB: [birth year] DOA: 02/09/2021 PCP: Larry Garter, MD      Brief Narrative:  [name] is a 80 y.o. M with Parkinson's disease, advanced dementia, now bedbound, lives at home, anemia of chronic disease, dCHF, CKD IIIa basleine 1.5 and hx nephrolithiasis last year requiring stent for sepsis who presented with 2 weeks progressively decreasing oral intake then a passing out spell  Wife reports that over the last several weeks, he has been eating and drinking less.  "Holding food in his mouth" and getting sleepier and sleepier.  On the day of admission, he had a passing out spell, and was less responsive than usual so she called EMS.  In the ER, blood pressure 80/50, lactate 3, creatinine 2.1 from baseline 1.5. K 2.6.  He was given IV fluids and his blood pressure normalized and the lactate resolved.    He was admitted for dehydration.         Assessment & Plan:  Acute metabolic encephalopathy At baseline patient is interactive, recognizes wife,, but is bedbound, has to be fed, and would not recognize strangers or know where he was.  On admission he was somnolent and less responsive than baseline.  CTH normal, CXR clear.  WBC normal.   He is improved with fluids.  I do not suspect a UTI is contributing, maybe anemia is, but this also may just be dehydration from poor oral intake from progression of his Parkinsons - Continue IV fliuds - Palliative care consultation appreciated - Avoid sedating medicines    Acute kidney injury on CKD stage IIIa Due to dehydration, now resolved to baseline    Acute on chronic anemia   Liekly baseline anemia of chronic disease, Hgb 9-10 g/dL.  Here, Hgb 9 initially, now down to 6.5 g/dL this morning  No clinical bleeding at all, must be dilutional - Transfuse 1 unit now  - Continue B12 - Check iron studies    Parkinson's disease -Continue  rivastigmine, Sinemet  Dementia with behavioral disturbance -Continue BuSpar, quetiapine  Elevated troponin Ischemia ruled out.  No ischemic work up needed, echo normal EF.    Pyuria UA from June showed no WBC, bacteria, and now is chagned.  However, no urinary symptoms.  His lactic acidosis and hypotension on admission were certainly not from sepsis, even if he does turn out to have a cystitis. - Follow urine culture - Hold antibiotics   Hypokalemia Due to poor PO intake Mag nl - Supplement K  Severe protein calorie malnutrition As evidenced by greater than 15 kg weight loss over the last year, Parkinson's, and diffuse subcutaneous muscle mass and fat loss     Disposition: Status is: Inpatient  Remains inpatient appropriate because: blood level dropping, requiring transfusion, also needs ongoing evaluation for cause of decline and discussion of goals of care  Dispo: The patient is from: Home              Anticipated d/c is to: Home              Patient currently is not medically stable to d/c.   Difficult to place patient No   80 year old man with Parkinson's admitted for dehydration.  I do not suspect an easily reversible cause of dehydration, rather than that it stems from his progressive neurological disease and that Hospice is now prudent.  This would be tru e even if a cystitis is contributing.  The  anemia is unexpected, but he is not a good candidate for endoscopy, has had no bleeding thus far and if this is a chronic issue and iron deficiency, I do not suspect it will change his overall disase trajectory.       Transfuse today, follow blood level tonight and tomorrow, follow uring culture and fever curve.    If Hgb stable, fever curve normal, can discuss going home tomorrow with oral abx for cystitis and oral iron and enrolling in Hospice     Level of care: Med-Surg       MDM: The below labs and imaging reports were reviewed and summarized above.   Medication management as above.    DVT prophylaxis: SCDs Start: 02/09/21 2029  Code Status: DNR Family Communication: Wife [name] by phone    Consultants:  Palliative Care  Procedures:  8/19 CTH unremarkable 8/20 Echo normal  Antimicrobials:  None   Culture data:  8/20 urine culture           Subjective: The patient has no complaints, says he feels fine.  He is interactive but confused where he is.  Oriented to self only.  Somewhat agitated and pulling out his IV per nursing.  No bowel movements.  Objective: Vitals:   02/10/21 1015 02/10/21 1045 02/10/21 1323 02/10/21 1330  BP: 125/68 (!) 145/86 (!) 153/84 (!) 158/80  Pulse: 73 82 73 73  Resp: 17     Temp:      TempSrc:      SpO2: 100% 100% 98% 98%    Intake/Output Summary (Last 24 hours) at 02/10/2021 1458 Last data filed at 02/10/2021 0900 Gross per 24 hour  Intake 2337.76 ml  Output --  Net 2337.76 ml   There were no vitals filed for this visit.  Examination: General appearance: Thin elderly adult male, alert and in no obvious distress.   HEENT: Anicteric, conjunctiva pink, lids and lashes normal. No nasal deformity, discharge, epistaxis.  Lips moist edentulous, oropharynx moist, no oral lesions, hearing diminished.   Skin: Warm and dry.  Senile purpura Cardiac: RRR, nl S1-S2, soft systolic murmur.  No LE edema.  Radial  pulses 2+ and symmetric. Respiratory: Normal respiratory rate and rhythm.  CTAB without rales or wheezes. Abdomen: Abdomen soft.  no TTP. No ascites, distension, hepatosplenomegaly.   MSK: No deformities or effusions.  Severe diffuse loss of subcutaneous muscle mass and fat, thenar wasting, temporal wasting Neuro: Awake and makes eye contact, oriented to self only.  EOMI, moves all extremities with generalized weakness. Speech fluent.    Psych: Sensorium intact and responding to questions, attention somewhat distracted, affect blunted, judgment and insight appear impaired.  Oriented to  self only, states that he is in [name] (per wife this is near his baseline)      Data Reviewed: I have personally reviewed following labs and imaging studies:  CBC: Recent Labs  Lab 02/09/21 1815 02/09/21 1832 02/10/21 0204 02/10/21 0726  WBC 5.4  --  5.1  --   NEUTROABS 4.6  --  3.7  --   HGB 9.0* 8.5* 7.8* 6.5*  HCT 27.7* 25.0* 22.8* 19.0*  MCV 87.7  --  84.4  --   PLT 163  --  135*  --    Basic Metabolic Panel: Recent Labs  Lab 02/09/21 1815 02/09/21 1832 02/10/21 0204 02/10/21 0726  NA 142 141 137 142  K 2.6* 2.5* 3.3* 3.0*  CL 104 102 103  --   CO2 28  --  27  --   GLUCOSE 118* 110* 101*  --   BUN 33* 30* 34*  --   CREATININE 2.10* 1.80* 1.95*  --   CALCIUM 8.0*  --  7.6*  --   MG 1.9  --  2.0  --   PHOS 2.8  --   --   --    GFR: CrCl cannot be calculated (Unknown ideal weight.). Liver Function Tests: Recent Labs  Lab 02/09/21 1815 02/10/21 0204  AST 8* <5*  ALT <5 <5  ALKPHOS 88 76  BILITOT 1.9* 1.6*  PROT 4.8* 4.3*  ALBUMIN 2.3* 2.0*   Recent Labs  Lab 02/09/21 1815  LIPASE 24   No results for input(s): AMMONIA in the last 168 hours. Coagulation Profile: Recent Labs  Lab 02/10/21 0204  INR 1.1   Cardiac Enzymes: Recent Labs  Lab 02/09/21 1815  CKTOTAL 20*   BNP (last 3 results) No results for input(s): PROBNP in the last 8760 hours. HbA1C: No results for input(s): HGBA1C in the last 72 hours. CBG: No results for input(s): GLUCAP in the last 168 hours. Lipid Profile: No results for input(s): CHOL, HDL, LDLCALC, TRIG, CHOLHDL, LDLDIRECT in the last 72 hours. Thyroid Function Tests: No results for input(s): TSH, T4TOTAL, FREET4, T3FREE, THYROIDAB in the last 72 hours. Anemia Panel: No results for input(s): VITAMINB12, FOLATE, FERRITIN, TIBC, IRON, RETICCTPCT in the last 72 hours. Urine analysis:    Component Value Date/Time   COLORURINE AMBER (A) 02/10/2021 0751   APPEARANCEUR CLOUDY (A) 02/10/2021 0751   LABSPEC 1.017  02/10/2021 0751   PHURINE 5.0 02/10/2021 0751   GLUCOSEU NEGATIVE 02/10/2021 0751   GLUCOSEU NEGATIVE 07/19/2020 1143   HGBUR SMALL (A) 02/10/2021 0751   BILIRUBINUR NEGATIVE 02/10/2021 0751   KETONESUR 5 (A) 02/10/2021 0751   PROTEINUR 30 (A) 02/10/2021 0751   UROBILINOGEN 0.2 07/19/2020 1143   NITRITE NEGATIVE 02/10/2021 0751   LEUKOCYTESUR LARGE (A) 02/10/2021 0751   Sepsis Labs: (procalcitonin:4,lacticacidven:4)  ) Recent Results (from the past 240 hour(s))  Resp Panel by RT-PCR (Flu A&B, Covid) Nasopharyngeal Swab     Status: None   Collection Time: 02/09/21  6:17 PM   Specimen: Nasopharyngeal Swab; Nasopharyngeal(NP) swabs in vial transport medium  Result Value Ref Range Status   SARS Coronavirus 2 by RT PCR NEGATIVE NEGATIVE Final    Comment: (NOTE) SARS-CoV-2 target nucleic acids are NOT DETECTED.  The SARS-CoV-2 RNA is generally detectable in upper respiratory specimens during the acute phase of infection. The lowest concentration of SARS-CoV-2 viral copies this assay can detect is 138 copies/mL. A negative result does not preclude SARS-Cov-2 infection and should not be used as the sole basis for treatment or other patient management decisions. A negative result may occur with  improper specimen collection/handling, submission of specimen other than nasopharyngeal swab, presence of viral mutation(s) within the areas targeted by this assay, and inadequate number of viral copies(<138 copies/mL). A negative result must be combined with clinical observations, patient history, and epidemiological information. The expected result is Negative.  Fact Sheet for Patients:  BloggerCourse.com  Fact Sheet for Healthcare Providers:  SeriousBroker.it  This test is no t yet approved or cleared by the [place] FDA and  has been authorized for detection and/or diagnosis of SARS-CoV-2 by FDA under an Emergency Use  Authorization (EUA). This EUA will remain  in effect (meaning this test can be used) for the duration of the COVID-19 declaration under Section 564(b)(1) of the Act, 21 U.S.C.section 360bbb-3(b)(1),  unless the authorization is terminated  or revoked sooner.       Influenza A by PCR NEGATIVE NEGATIVE Final   Influenza B by PCR NEGATIVE NEGATIVE Final    Comment: (NOTE) The Xpert Xpress SARS-CoV-2/FLU/RSV plus assay is intended as an aid in the diagnosis of influenza from Nasopharyngeal swab specimens and should not be used as a sole basis for treatment. Nasal washings and aspirates are unacceptable for Xpert Xpress SARS-CoV-2/FLU/RSV testing.  Fact Sheet for Patients: BloggerCourse.comhttps://www.fda.gov/media/152166/download  Fact Sheet for Healthcare Providers: SeriousBroker.ithttps://www.fda.gov/media/152162/download  This test is not yet approved or cleared by the [place] FDA and has been authorized for detection and/or diagnosis of SARS-CoV-2 by FDA under an Emergency Use Authorization (EUA). This EUA will remain in effect (meaning this test can be used) for the duration of the COVID-19 declaration under Section 564(b)(1) of the Act, 21 U.S.C. section 360bbb-3(b)(1), unless the authorization is terminated or revoked.  Performed at [facility], [address] [place], [place] [address]          Radiology Studies: CT HEAD WO CONTRAST (5MM)  Result Date: 02/09/2021 CLINICAL DATA:  Unknown cause altered mental status. EXAM: CT HEAD WITHOUT CONTRAST TECHNIQUE: Contiguous axial images were obtained from the base of the skull through the vertex without intravenous contrast. COMPARISON:  CT head 05/10/2020 FINDINGS: Brain: Cerebral ventricle sizes are concordant with the degree of cerebral volume loss. Patchy and confluent areas of decreased attenuation are noted throughout the deep and periventricular white matter of the cerebral hemispheres bilaterally, compatible with chronic microvascular  ischemic disease. No evidence of large-territorial acute infarction. No parenchymal hemorrhage. No mass lesion. No extra-axial collection. No mass effect or midline shift. No hydrocephalus. Basilar cisterns are patent. Vascular: No hyperdense vessel. Atherosclerotic calcifications are present within the cavernous internal carotid arteries. Skull: No acute fracture or focal lesion. Sinuses/Orbits: Paranasal sinuses and mastoid air cells are clear. Right lens replacement. Otherwise orbits are unremarkable. Other: None. IMPRESSION: Negative for acute traumatic injury. Electronically Signed   By: Tish FredericksonMorgane  Naveau M.D.   On: 02/09/2021 19:42   DG Chest Portable 1 View  Result Date: 02/09/2021 CLINICAL DATA:  Altered mental status.  Hypertension. EXAM: PORTABLE CHEST 1 VIEW COMPARISON:  None. FINDINGS: The heart size and mediastinal contours are within normal limits. Ectasia and atherosclerotic calcification of thoracic aorta noted. Both lungs are clear. The visualized skeletal structures are unremarkable. IMPRESSION: No active disease. Electronically Signed   By: Danae OrleansJohn A Stahl M.D.   On: 02/09/2021 19:03   ECHOCARDIOGRAM LIMITED  Result Date: 02/10/2021    ECHOCARDIOGRAM LIMITED REPORT   Patient Name:   Otila BackWOODY M Renovato Date of Exam: 02/10/2021 Medical Rec #:  [id number]      Height:       68.0 in Accession #:    [id number]     Weight:       158.0 lb Date of Birth:  03/12/1941       BSA:          1.849 m Patient Age:    80 years       BP:           125/68 mmHg Patient Gender: M              HR:           74 bpm. Exam Location:  Inpatient Procedure: Limited Color Doppler and Color Doppler Indications:    Elevated troponin  History:  Patient has prior history of Echocardiogram examinations, most                 recent 05/04/2020. COPD, Signs/Symptoms:Altered Mental Status;                 Risk Factors:Former Smoker. Parkinsons's dementia.  Sonographer:    Roosvelt Maser RDCS Referring Phys: [id number] Lennis Korb P  Lanitra Battaglini  Sonographer Comments: Technically challenging study due to limited acoustic windows, Technically difficult study due to poor echo windows, suboptimal parasternal window, suboptimal subcostal window and no apical window. Image acquisition challenging due to COPD. IMPRESSIONS  1. Extremely limited study with just a few views.  2. Left ventricular ejection fraction, by estimation, is 65 to 70% based on brief glimpses of the myocardium mainly in parasternal and apical views. The left ventricle has normal function. Left ventricular endocardial border not optimally defined to evaluate regional wall motion.  3. Right ventricular systolic function is normal. The right ventricular size is not well visualized. Comparison(s): Prior images reviewed side by side. LVEF looks to be grossly normal range as before. FINDINGS  Left Ventricle: Left ventricular ejection fraction, by estimation, is 65 to 70%. The left ventricle has normal function. Left ventricular endocardial border not optimally defined to evaluate regional wall motion. Right Ventricle: The right ventricular size is not well visualized. Right vetricular wall thickness was not assessed. Right ventricular systolic function is normal. Nona Dell MD Electronically signed by Nona Dell MD Signature Date/Time: 02/10/2021/11:47:27 AM    Final         Scheduled Meds:  busPIRone  10 mg Oral BID   carbidopa-levodopa  1 tablet Oral TID   mirtazapine  7.5 mg Oral QHS   QUEtiapine  25-50 mg Oral QHS   rivastigmine  1.5 mg Oral BID   rosuvastatin  10 mg Oral Daily   vitamin B-12  1,000 mcg Oral Daily   Continuous Infusions:  lactated ringers 100 mL/hr at 02/10/21 1349     LOS: 1 day    Time spent: 35 minutes    Alberteen Sam, MD [facility] 02/10/2021, 2:58 PM     Please page though AMION or Epic secure chat:  For [employer], Higher education careers adviser

## 2021-02-10 NOTE — ED Notes (Signed)
Pt resting quietly in bed with eyes closed. Pt calm and cooperative with staff.

## 2021-02-10 NOTE — ED Notes (Signed)
Palliative care NP at bedside at this time. 

## 2021-02-10 NOTE — ED Notes (Signed)
MD sent secure chat and charge RN aware that patient might need sitter. Awaiting response from inpatient MD.

## 2021-02-10 NOTE — ED Notes (Signed)
Per MD, pt may have sitter for safety. Charge RN aware.

## 2021-02-10 NOTE — ED Notes (Signed)
DNR bracelet placed onto the pts rt wrist

## 2021-02-10 NOTE — ED Notes (Signed)
Urine culture sent down with UA. 

## 2021-02-10 NOTE — ED Notes (Signed)
Pt continuously undressing himself, attempting to pull out PIV, taking off cardiac leads, pulling off condom catheter. Pt redirectable, but begins pulling everything off as soon as RN leaves room. Will notify MD.

## 2021-02-10 NOTE — ED Notes (Signed)
Channel changed per pt request.

## 2021-02-10 NOTE — ED Notes (Signed)
Per palliative care NP, wait on blood administration until she has spoken with spouse to confirm.

## 2021-02-10 NOTE — ED Notes (Signed)
Pt sleeping but moves around  He keeps bending his rt arm and although his iv is not in the a-c it cuts off the iv flow  Lower false teeth hanging out the side of his mouth  I  Talked about it to him and he pulled out the lower plate straightened it out and put it straight into his mouth

## 2021-02-10 NOTE — ED Notes (Signed)
Attempted to call report X 1. 

## 2021-02-11 DIAGNOSIS — G9340 Encephalopathy, unspecified: Secondary | ICD-10-CM | POA: Diagnosis not present

## 2021-02-11 DIAGNOSIS — N179 Acute kidney failure, unspecified: Secondary | ICD-10-CM | POA: Diagnosis not present

## 2021-02-11 DIAGNOSIS — Z789 Other specified health status: Secondary | ICD-10-CM

## 2021-02-11 DIAGNOSIS — Z66 Do not resuscitate: Secondary | ICD-10-CM

## 2021-02-11 DIAGNOSIS — Z711 Person with feared health complaint in whom no diagnosis is made: Secondary | ICD-10-CM

## 2021-02-11 DIAGNOSIS — R638 Other symptoms and signs concerning food and fluid intake: Secondary | ICD-10-CM

## 2021-02-11 DIAGNOSIS — Z515 Encounter for palliative care: Secondary | ICD-10-CM

## 2021-02-11 DIAGNOSIS — I5032 Chronic diastolic (congestive) heart failure: Secondary | ICD-10-CM | POA: Diagnosis not present

## 2021-02-11 DIAGNOSIS — G20 Parkinson's disease: Secondary | ICD-10-CM | POA: Diagnosis not present

## 2021-02-11 DIAGNOSIS — Z7189 Other specified counseling: Secondary | ICD-10-CM

## 2021-02-11 LAB — TYPE AND SCREEN
ABO/RH(D): O POS
Antibody Screen: NEGATIVE
Unit division: 0

## 2021-02-11 LAB — BASIC METABOLIC PANEL
Anion gap: 8 (ref 5–15)
BUN: 30 mg/dL — ABNORMAL HIGH (ref 8–23)
CO2: 28 mmol/L (ref 22–32)
Calcium: 7.8 mg/dL — ABNORMAL LOW (ref 8.9–10.3)
Chloride: 104 mmol/L (ref 98–111)
Creatinine, Ser: 1.63 mg/dL — ABNORMAL HIGH (ref 0.61–1.24)
GFR, Estimated: 42 mL/min — ABNORMAL LOW (ref >60)
Glucose, Bld: 99 mg/dL (ref 70–99)
Potassium: 2.6 mmol/L — CL (ref 3.5–5.1)
Sodium: 140 mmol/L (ref 135–145)

## 2021-02-11 LAB — CBC
HCT: 27.3 % — ABNORMAL LOW (ref 39.0–52.0)
Hemoglobin: 9.3 g/dL — ABNORMAL LOW (ref 13.0–17.0)
MCH: 29.2 pg (ref 26.0–34.0)
MCHC: 34.1 g/dL (ref 30.0–36.0)
MCV: 85.8 fL (ref 80.0–100.0)
Platelets: 133 10*3/uL — ABNORMAL LOW (ref 150–400)
RBC: 3.18 MIL/uL — ABNORMAL LOW (ref 4.22–5.81)
RDW: 12.2 % (ref 11.5–15.5)
WBC: 6.3 10*3/uL (ref 4.0–10.5)
nRBC: 0 % (ref 0.0–0.2)

## 2021-02-11 LAB — BPAM RBC
Blood Product Expiration Date: 202208272359
ISSUE DATE / TIME: 202208201816
Unit Type and Rh: 5100

## 2021-02-11 LAB — POTASSIUM: Potassium: 3.8 mmol/L (ref 3.5–5.1)

## 2021-02-11 LAB — TSH: TSH: 1.093 u[IU]/mL (ref 0.350–4.500)

## 2021-02-11 MED ORDER — POTASSIUM CHLORIDE 10 MEQ/100ML IV SOLN
10.0000 meq | INTRAVENOUS | Status: AC
  Administered 2021-02-11 (×6): 10 meq via INTRAVENOUS
  Filled 2021-02-11 (×6): qty 100

## 2021-02-11 MED ORDER — POTASSIUM CHLORIDE 20 MEQ PO PACK
40.0000 meq | PACK | Freq: Once | ORAL | Status: AC
  Administered 2021-02-11: 40 meq via ORAL
  Filled 2021-02-11: qty 2

## 2021-02-11 NOTE — Progress Notes (Signed)
Daily Progress Note   Patient Name: [name]       Date: 02/11/2021 DOB: 05/21/1941  Age: 80 y.o. MRN#: [id number] Attending Physician: Cipriano BunkerKumar, Pardeep, MD Primary Care Physician: Tresa GarterPlotnikov, Aleksei V, MD Admit Date: 02/09/2021  Reason for Consultation/Follow-up: Disposition and Establishing goals of care  Subjective: Chart review performed. Received report from primary RN - no acute concerns. RN reports patient only takes minimal sips of liquid/bites of applesauce with medications, otherwise has no PO intake and is lethargic.   11:00 AM Went to visit patient at bedside for scheduled meeting with wife/[name]. Patient's daughter/Kineata was also present via speakerphone. Patient was lying in bed asleep - he did not wake to voice/gentle touch. No signs or non-verbal gestures of pain or discomfort noted. No respiratory distress, increased work of breathing, or secretions noted.   Reviewed patient's interval history since admission. We reviewed the concern over patient's incremental decline in functional status/dysphagia prior to hospitalization, which are in line with dementia/Parkinson's disease trajectory. Reviewed that medical work up does not show any acute cause of decline and that patient's history is significant for gradually worsening progressive symptoms of dementia/Parkinson's which has likely reached end stages and not reversable. Expectations at end of life were reviewed in detail.   Recommendation for comfort measures were given. We talked about transition to comfort measures in house and what that would entail inclusive of medications to control pain, dyspnea, agitation, nausea, itching, and hiccups. We discussed stopping all unnecessary measures such as blood draws, needle sticks,  oxygen, antibiotics, CBGs/insulin, cardiac monitoring, and frequent vital signs - also specifically discussing not supplementing potassium or providing any further blood transfusions.Provided education and counseling at length on the philosophy and benefits of hospice care. Discussed that it offers a holistic approach to care in the setting of end-stage illness, and is about supporting the patient where they are allowing nature to take it's course. Discussed the hospice team includes RNs, physicians, social workers, and chaplains. They can provide personal care, support for the family, and help keep patient out of the hospital. Provided reassurance that residential hospice referral could be cancelled (would anticipate hospital death) at any time if patient's condition changed and it was felt they were too unstable for transfer. Residential vs home hospice care were discussed - family do not feel they  could care for the patient at home and seem to be interested in residential hospice placement. Prognostication was reviewed in context of patient's no oral intake. We reviewed that time is limited for the patient no matter which path is chosen (comfort vs aggressive).  Per family's request we discussed benefits/risk of PEG tubes. We discussed that evidence has shown that PEG tubes in patients with advanced dementia do not increase survival, prevent aspiration, or improve wound healing.  They can promote isolation and use of restraints leading to increased potential for pressure ulcers. Use of PEG tubes is not medically recommended in this population; rather, careful hand feeding with aspiration precautions has been shown to provide the best quality of life. Most importantly, reviewed that patient's wishes were to not pursue PEG tube once at end stages of disease. Family expressed agreement that they would want to support patient's wishes.   Emotional support and therapeutic listening provided throughout visit.  [place] is having a hard time coping with the patient's decline.   Current  EOL visitation policy was reviewed.   Family request time to discuss information provided to them today and will call PMT with decisions. Otherwise are ok with PMT to reach out again tomorrow. Knowing that it is anticipated patient will continue to decline and if it would be family's goal for transfer to residential hospice facility, they understand recommendation to transfer him sooner than later would be less risk for him to pass in transport.   All questions and concerns addressed. Encouraged to call with questions and/or concerns. PMT card/number provided.  Length of Stay: 2  Current Medications: Scheduled Meds:  . busPIRone  10 mg Oral BID  . carbidopa-levodopa  1 tablet Oral TID  . mirtazapine  7.5 mg Oral QHS  . QUEtiapine  25-50 mg Oral QHS  . rivastigmine  1.5 mg Oral BID  . rosuvastatin  10 mg Oral Daily  . vitamin B-12  1,000 mcg Oral Daily    Continuous Infusions: . potassium chloride 10 mEq (02/11/21 1158)    PRN Meds: acetaminophen **OR** acetaminophen  Physical Exam Vitals and nursing note reviewed.  Constitutional:      General: He is not in acute distress.    Appearance: He is cachectic. He is ill-appearing.  Pulmonary:     Effort: No respiratory distress.  Skin:    General: Skin is cool and dry.  Neurological:     Mental Status: He is lethargic.     Motor: Weakness present.            Vital Signs: BP (!) 187/84 (BP Location: Left Arm)   Pulse 66   Temp 97.8 F (36.6 C) (Oral)   Resp 16   Wt 52.7 kg   SpO2 100%   BMI 17.67 kg/m  SpO2: SpO2: 100 % O2 Device: O2 Device: Room Air O2 Flow Rate:    Intake/output summary:  Intake/Output Summary (Last 24 hours) at 02/11/2021 1213 Last data filed at 02/11/2021 0700 Gross per 24 hour  Intake 1669.07 ml  Output 600 ml  Net 1069.07 ml   LBM: Last BM Date: 02/10/21 Baseline Weight: Weight: 52.7 kg Most recent  weight: Weight: 52.7 kg       Palliative Assessment/Data: PPS 10-20%    Flowsheet Rows    Flowsheet Row Most Recent Value  Intake Tab   Referral Department Hospitalist  Unit at Time of Referral ER  Palliative Care Primary Diagnosis Neurology  Date Notified 02/10/21  Palliative Care Type Return  patient Palliative Care  Reason for referral Clarify Goals of Care  Date of Admission 02/09/21  Date first seen by Palliative Care 02/10/21  # of days Palliative referral response time 0 Day(s)  # of days IP prior to Palliative referral 1  Clinical Assessment   Psychosocial & Spiritual Assessment   Palliative Care Outcomes   Patient/Family meeting held? Yes  Who was at the meeting? wife  Palliative Care Outcomes Clarified goals of care, Counseled regarding hospice, Provided psychosocial or spiritual support  [in person meeting scheduled for tomorrow]  Patient/Family wishes: Interventions discontinued/not started  Mechanical Ventilation, BiPAP, Tube feedings/TPN, NIPPV, Hemodialysis, Trach, PEG, Transfer out of ICU, Vasopressors       Patient Active Problem List   Diagnosis Date Noted  . AKI (acute kidney injury) (HCC) 02/10/2021  . Elevated troponin 02/10/2021  . Hypotension 02/10/2021  . Lactic acidosis 02/10/2021  . Chronic diastolic CHF (congestive heart failure) (HCC) 02/10/2021  . Atherosclerosis of aorta (HCC) 10/17/2020  . History of CVA (cerebrovascular accident) 05/11/2020  . Acute encephalopathy 05/10/2020  . AMS (altered mental status) 05/04/2020  . Syncope 05/03/2020  . Neutropenia (HCC) 05/03/2020  . Thrombocytopenia (HCC) 05/03/2020  . COVID-19 virus infection 05/03/2020  . Shingles 11/17/2019  . Hydronephrosis with renal and ureteral calculus obstruction 07/06/2019  . Constipation 07/03/2019  . Acute metabolic encephalopathy 07/03/2019  . CRF (chronic renal failure), stage 3 (moderate) 03/18/2019  . Pressure injury of skin 02/14/2019  . ARF (acute renal  failure) (HCC) 02/13/2019  . Sepsis (HCC) 02/13/2019  . Protein-calorie malnutrition, severe (HCC) 02/13/2019  . 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 Parkinson's syndrome (HCC) 02/13/2019  . Infected sebaceous cyst 12/04/2018  . Hallucinations 11/19/2018  . Shoulder pain 11/19/2018  . Vitamin B12 deficiency 11/19/2018  . 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 Memory loss 09/21/2018  . Falls frequently 09/21/2018  . Fatigue 10/27/2017  . Edema 07/29/2017  . Chronic venous insufficiency 06/30/2017  . Cellulitis and abscess of leg, except foot 06/30/2017  . Ear pain, referred, right 06/10/2016  . Urinary frequency 04/16/2016  . Cerumen impaction 08/22/2015  . Infection of right hand due to bite 04/20/2014  . Hemifacial spasm 09/15/2013  . Well adult exam 07/16/2013  . Dermatochalasis of eyelid 01/01/2012  . Other facial nerve disorders 01/01/2012  . 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 Twitching 11/04/2011  . Non-REM Parasomnia 10/14/2011  . Dog bite of left hand 08/19/2011  . Wound infection 08/19/2011  . Hypokalemia 12/10/2010  . Hyperglycemia 12/10/2010  . Dyslipidemia   . Benign prostatic hyperplasia   . LEG CRAMPS 03/07/2009  . OTITIS EXTERNA 08/30/2008  . Prostatitis 08/30/2008  . Essential hypertension 08/26/2007  . ERECTILE DYSFUNCTION 08/26/2007    Palliative Care Assessment & Plan   Patient Profile: 80 y.o. male  with past medical history of chronic diastolic heart failure, chronic anemia, hyperlipidemia, hypertension, BPH, advanced Parkinson's disease with dementia, stage IIIa chronic kidney disease presented to the ED on 02/09/21 from [facility] due to an unresponsive episode. The patient's wife reports that the patient has exhibited progressive increased somnolence, diminished responsiveness, with minimal PO intake over the last week. Patient was found to be hypotensive in ED. Patient was admitted on 02/09/2021  with acute encephalopathy, AKI, elevated troponin, hypotension, hypokalemia, lactic acidosis.    Of note, patient was previously seen by PMT in December 2021 and had a recent outpatient Palliative Care visit about 10 days ago with [facility].  ED Course:  Vital signs in the ED were notable for the following: Tetramex 97.7, heart rate 66-84.  Initial blood pressure 82/54, which is improved to 138/71 following IV fluid bolus as further detailed below, respiratory rate 12-18, oxygen saturation 100% on room air.   Labs were notable for the following: CMP notable for the following: Potassium 2.6, bicarbonate 28, creatinine 2.10 relative to most recent prior value of 1.47 in March 22, glucose 118.  CPK 20.  Initial troponin I found to be 41, with repeat value trending up slightly to 50.  Initial lactate 3.0, which improved to 1.7 following interval IV fluids.  CBC notable for white blood cell count 5400.  Urinalysis ordered, with result currently pending.  Screening COVID-19 PCR ordered in the ED, with result currently pending.   EKG showed sinus rhythm with heart rate 74, normal intervals, and no evidence of T wave or ST changes.  Chest x-ray showed no evidence of acute cardiopulmonary process, while noncontrast CT head showed no evidence of acute intracranial process, including no evidence of intracranial hemorrhage or acute ischemic infarct.     While in the ED, the following were administered: Normal saline x1 L bolus, potassium chloride 10 mill equivalents IV over 1 hour x 1 dose.  Assessment: Acute metabolic encephalopathy AKI on CKD stage 3a Acute on chronic anemia Parkinson's disease with dementia Severe protein calorie malnutrition  Recommendations/Plan: Continue current conservative medical management Continue DNR/DNI as previously documented Patient's history is significant for gradually worsening progressive symptoms of dementia/Parkinson's and is likely approaching EOL. Patient would  be hospice appropriate if/when family decide on comfort path - family are discussing information given to them today and will have decision tomorrow 8/22 Wife is clear goal is not to pursue feeding tube PMT will continue to follow and support holistically  Goals of Care and Additional Recommendations: Limitations on Scope of Treatment: No Artificial Feeding  Code Status:    Code Status Orders  (From admission, onward)           Start     Ordered   02/09/21 2143  Do not attempt resuscitation (DNR)  Continuous       Question Answer Comment  In the event of cardiac or respiratory ARREST Do not call a "code blue"   In the event of cardiac or respiratory ARREST Do not perform Intubation, CPR, defibrillation or ACLS   In the event of cardiac or respiratory ARREST Use medication by any route, position, wound care, and other measures to relive pain and suffering. May use oxygen, suction and manual treatment of airway obstruction as needed for comfort.      02/09/21 2142           Code Status History     Date Active Date Inactive Code Status Order ID Comments User Context   02/09/2021 2029 02/09/2021 2142 Full Code [id number]  Angie Fava, DO ED   05/25/2020 1352 06/01/2020 0053 DNR [id number]  Merry Proud, NP Inpatient   05/11/2020 0127 05/25/2020 1352 Full Code [id number]  Synetta Fail, MD Inpatient   05/10/2020 2320 05/11/2020 0127 DNR [id number]  Synetta Fail, MD ED   05/04/2020 0005 05/09/2020 1815 DNR [id number]  Albertine Grates, MD Inpatient   07/04/2019 0754 07/06/2019 2210 DNR [id number]  Therisa Doyne, MD Inpatient   02/13/2019 1644 02/24/2019 1619 Full Code [id number]  Pearson Grippe, MD ED   04/20/2014 1315 04/24/2014 1440 Full Code [id number]  Dominica Severin, MD Inpatient  Prognosis:  < 2 weeks  Discharge Planning: To Be Determined  Care plan was discussed with primary RN, patient's wife and daughter  Thank you for allowing the Palliative Medicine  Team to assist in the care of this patient.   Total Time 70 minutes Prolonged Time Billed  yes       Greater than 50%  of this time was spent counseling and coordinating care related to the above assessment and plan.  Haskel Khan, NP  Please contact Palliative Medicine Team phone at [phone] for questions and concerns.

## 2021-02-11 NOTE — Progress Notes (Signed)
PT Cancellation Note  Patient Details Name: [name] MRN: [id number] DOB: [birth year]   Cancelled Treatment:    Reason Eval/Treat Not Completed: Other (comment)  Orders received and chart reviewed;  Pt from SNF, with noted functional decline since last PT visit during admission 05/2020; Currently bedbound at SNF, notably decr oral intake;  Palliative Medicine is onboard, with a plan for family meeting later today;  If pt's oral intake does not increase, I'm unsure that PT has a role in [name] care; Will hold PT today, and await further guidance from Palliative Team; If PT is congruent with pt's Goals of Care, will proceed with evaluation;  If PT is not congruent with Goals of Care, will sign off;   Thank you,   Van Clines, PT  Acute Rehabilitation Services Pager [phone] Office [phone]    Levi Aland 02/11/2021, 8:56 AM

## 2021-02-11 NOTE — Progress Notes (Signed)
PROGRESS NOTE    [name]  GYI:[id number] DOB: May 10, 1941 DOA: 02/09/2021 PCP: [name], MD   Brief Narrative:  This 80 years old male with PMH of Parkinson's disease, advanced dementia, now bedbound, lives at home, anemia of chronic disease, diastolic CHF, CKD stage III with baseline serum creatinine 1.5 and history of nephrolithiasis last year requiring a stent for sepsis who presented with 2 weeks history of progressive decline in oral intake and then a passing out spell. Wife reported over the last several weeks he has been eating and drinking less, holding food in his mouth and remains drowsy. On the day of admission he had a passing out spell and was less responsive than usual so called EMS.  In the ER and blood pressure found to be 80 x 50 lactate 3.0 creatinine 2.1 from baseline potassium 2.6 he was given IV fluids and blood pressure improved with resolution of lactic acidosis.  Patient is admitted for dehydration.  Assessment & Plan:   Principal Problem:   Acute encephalopathy Active Problems:   Dyslipidemia   Hypokalemia   Parkinson's syndrome (HCC)   AKI (acute kidney injury) (HCC)   Elevated troponin   Hypotension   Lactic acidosis   Chronic diastolic CHF (congestive heart failure) (HCC)  Acute metabolic encephalopathy.  Multifactorial At baseline patient is interactive, recognizes wife, but is bedbound, has to be fed, and would not recognize strangers or know where he was.  On admission he was somnolent and less responsive than baseline. CT head unremarkable, CXR clear.  WBC normal.  He has improved with fluids.  It could be multifactorial, UTI, anemia,  may be dehydration from poor p.o. intake as a result of progression of Parkinson's disease. Continue gentle IV hydration. Palliative care consulted to discuss goals of care. Avoid sedating medications.  AKI on CKD stage IIIa: Now resolved,  serum creatinine back to baseline.   Acute on chronic anemia    Liekly baseline anemia of chronic disease, Hgb 9-10 g/dL. Hemoglobin dropped to 6.5 from 9.0 No clinical bleeding at all, may be hemodilution all. Transfuse 1 unit PRBC.  Hemoglobin 9.3 posttransfusion Check iron studies and B12.  Parkinson's disease :   Continue rivastigmine and Sinemet.  Dementia: Continue BuSpar and quetiapine  Elevated troponin : Ischemia ruled out.  Echo normal.   Hypokalemia: Due to poor p.o. intake, Replaced, continue to monitor.  Pyuria UA from June showed no WBC, bacteria, and now is changed.  However, no urinary symptoms.   His lactic acidosis and hypotension on admission were certainly not from sepsis, even if he does turn out to have a cystitis. Follow-up urine culture, hold on antibiotics for now.    Severe protein calorie malnutrition As evidenced by greater than 15 kg weight loss over the last year, Parkinson's, and diffuse subcutaneous muscle mass and fat loss     DVT prophylaxis: SCDs Code Status: DNR Family Communication: No family at bed side. Disposition Plan:    Status is: Inpatient  Remains inpatient appropriate because:Inpatient level of care appropriate due to severity of illness  Dispo: The patient is from: Home              Anticipated d/c is to: SNF              Patient currently is not medically stable to d/c.   Difficult to place patient No Consultants:  Palliative care.  Procedures: None Antimicrobials: None.  Subjective: Patient was seen and examined at bedside.  Overnight events noted.  Patient is nonverbal, Not conversant.  Patient opens eyes on calling his name.  Objective: Vitals:   02/10/21 2132 02/10/21 2315 02/11/21 0444 02/11/21 0819  BP: (!) 155/80 131/73 (!) 167/82 (!) 187/84  Pulse: 67 63 64 66  Resp: 16 14 16 16   Temp: 98.5 F (36.9 C) 97.7 F (36.5 C) 97.7 F (36.5 C) 97.8 F (36.6 C)  TempSrc: Oral Oral Axillary Oral  SpO2: 93% 97% 100% 100%  Weight:   52.7 kg     Intake/Output Summary  (Last 24 hours) at 02/11/2021 1520 Last data filed at 02/11/2021 0700 Gross per 24 hour  Intake 1669.07 ml  Output 600 ml  Net 1069.07 ml   Filed Weights   02/11/21 0444  Weight: 52.7 kg    Examination:  General exam: Appears comfortable, thin built, chronically deconditioned.  Not in distress Respiratory system: Clear to auscultation bilaterally, decreased breath sounds. Cardiovascular system: S1-S2 heard, regular rate and rhythm, no murmur. Gastrointestinal system: Abdomen is soft, nontender, nondistended, bowel sounds positive Central nervous system: Alert and arousable.  No focal neurological deficits. Extremities: Moves all extremities. Skin: No rashes, lesions or ulcers Psychiatry: Mood and affect appropriate    Data Reviewed: I have personally reviewed following labs and imaging studies  CBC: Recent Labs  Lab 02/09/21 1815 02/09/21 1832 02/10/21 0204 02/10/21 0726 02/11/21 0132  WBC 5.4  --  5.1  --  6.3  NEUTROABS 4.6  --  3.7  --   --   HGB 9.0* 8.5* 7.8* 6.5* 9.3*  HCT 27.7* 25.0* 22.8* 19.0* 27.3*  MCV 87.7  --  84.4  --  85.8  PLT 163  --  135*  --  133*   Basic Metabolic Panel: Recent Labs  Lab 02/09/21 1815 02/09/21 1832 02/10/21 0204 02/10/21 0726 02/11/21 0132  NA 142 141 137 142 140  K 2.6* 2.5* 3.3* 3.0* 2.6*  CL 104 102 103  --  104  CO2 28  --  27  --  28  GLUCOSE 118* 110* 101*  --  99  BUN 33* 30* 34*  --  30*  CREATININE 2.10* 1.80* 1.95*  --  1.63*  CALCIUM 8.0*  --  7.6*  --  7.8*  MG 1.9  --  2.0  --   --   PHOS 2.8  --   --   --   --    GFR: Estimated Creatinine Clearance: 26.5 mL/min (A) (by C-G formula based on SCr of 1.63 mg/dL (H)). Liver Function Tests: Recent Labs  Lab 02/09/21 1815 02/10/21 0204  AST 8* <5*  ALT <5 <5  ALKPHOS 88 76  BILITOT 1.9* 1.6*  PROT 4.8* 4.3*  ALBUMIN 2.3* 2.0*   Recent Labs  Lab 02/09/21 1815  LIPASE 24   No results for input(s): AMMONIA in the last 168 hours. Coagulation  Profile: Recent Labs  Lab 02/10/21 0204  INR 1.1   Cardiac Enzymes: Recent Labs  Lab 02/09/21 1815  CKTOTAL 20*   BNP (last 3 results) No results for input(s): PROBNP in the last 8760 hours. HbA1C: No results for input(s): HGBA1C in the last 72 hours. CBG: No results for input(s): GLUCAP in the last 168 hours. Lipid Profile: No results for input(s): CHOL, HDL, LDLCALC, TRIG, CHOLHDL, LDLDIRECT in the last 72 hours. Thyroid Function Tests: Recent Labs    02/11/21 0132  TSH 1.093   Anemia Panel: Recent Labs    02/10/21 1658  FERRITIN 518*  TIBC 112*  IRON 97   Sepsis Labs: Recent Labs  Lab 02/09/21 1809 02/09/21 2119  LATICACIDVEN 3.0* 1.7    Recent Results (from the past 240 hour(s))  Resp Panel by RT-PCR (Flu A&B, Covid) Nasopharyngeal Swab     Status: None   Collection Time: 02/09/21  6:17 PM   Specimen: Nasopharyngeal Swab; Nasopharyngeal(NP) swabs in vial transport medium  Result Value Ref Range Status   SARS Coronavirus 2 by RT PCR NEGATIVE NEGATIVE Final    Comment: (NOTE) SARS-CoV-2 target nucleic acids are NOT DETECTED.  The SARS-CoV-2 RNA is generally detectable in upper respiratory specimens during the acute phase of infection. The lowest concentration of SARS-CoV-2 viral copies this assay can detect is 138 copies/mL. A negative result does not preclude SARS-Cov-2 infection and should not be used as the sole basis for treatment or other patient management decisions. A negative result may occur with  improper specimen collection/handling, submission of specimen other than nasopharyngeal swab, presence of viral mutation(s) within the areas targeted by this assay, and inadequate number of viral copies(<138 copies/mL). A negative result must be combined with clinical observations, patient history, and epidemiological information. The expected result is Negative.  Fact Sheet for Patients:  BloggerCourse.com  Fact Sheet for  Healthcare Providers:  SeriousBroker.it  This test is no t yet approved or cleared by the [place] FDA and  has been authorized for detection and/or diagnosis of SARS-CoV-2 by FDA under an Emergency Use Authorization (EUA). This EUA will remain  in effect (meaning this test can be used) for the duration of the COVID-19 declaration under Section 564(b)(1) of the Act, 21 U.S.C.section 360bbb-3(b)(1), unless the authorization is terminated  or revoked sooner.       Influenza A by PCR NEGATIVE NEGATIVE Final   Influenza B by PCR NEGATIVE NEGATIVE Final    Comment: (NOTE) The Xpert Xpress SARS-CoV-2/FLU/RSV plus assay is intended as an aid in the diagnosis of influenza from Nasopharyngeal swab specimens and should not be used as a sole basis for treatment. Nasal washings and aspirates are unacceptable for Xpert Xpress SARS-CoV-2/FLU/RSV testing.  Fact Sheet for Patients: BloggerCourse.com  Fact Sheet for Healthcare Providers: SeriousBroker.it  This test is not yet approved or cleared by the [place] FDA and has been authorized for detection and/or diagnosis of SARS-CoV-2 by FDA under an Emergency Use Authorization (EUA). This EUA will remain in effect (meaning this test can be used) for the duration of the COVID-19 declaration under Section 564(b)(1) of the Act, 21 U.S.C. section 360bbb-3(b)(1), unless the authorization is terminated or revoked.  Performed at [facility], [address] [place], [place] [address]   Blood culture (routine x 2)     Status: None (Preliminary result)   Collection Time: 02/09/21 10:45 PM   Specimen: BLOOD RIGHT FOREARM  Result Value Ref Range Status   Specimen Description BLOOD RIGHT FOREARM  Final   Special Requests   Final    BOTTLES DRAWN AEROBIC AND ANAEROBIC Blood Culture adequate volume   Culture   Final    NO GROWTH 1 DAY Performed at [facility], [address] [place], [place] [address]    Report Status PENDING  Incomplete  Urine Culture     Status: Abnormal (Preliminary result)   Collection Time: 02/10/21 11:42 AM   Specimen: Urine, Catheterized  Result Value Ref Range Status   Specimen Description URINE, CATHETERIZED  Final   Special Requests NONE  Final   Culture (  A)  Final    >=100,000 COLONIES/mL ESCHERICHIA COLI SUSCEPTIBILITIES TO FOLLOW Performed at [facility], [address], [place], [place] [address]    Report Status PENDING  Incomplete         Radiology Studies: CT HEAD WO CONTRAST ( )  Result Date: 02/09/2021 CLINICAL DATA:  Unknown cause altered mental status. EXAM: CT HEAD WITHOUT CONTRAST TECHNIQUE: Contiguous axial images were obtained from the base of the skull through the vertex without intravenous contrast. COMPARISON:  CT head 05/10/2020 FINDINGS: Brain: Cerebral ventricle sizes are concordant with the degree of cerebral volume loss. Patchy and confluent areas of decreased attenuation are noted throughout the deep and periventricular white matter of the cerebral hemispheres bilaterally, compatible with chronic microvascular ischemic disease. No evidence of large-territorial acute infarction. No parenchymal hemorrhage. No mass lesion. No extra-axial collection. No mass effect or midline shift. No hydrocephalus. Basilar cisterns are patent. Vascular: No hyperdense vessel. Atherosclerotic calcifications are present within the cavernous internal carotid arteries. Skull: No acute fracture or focal lesion. Sinuses/Orbits: Paranasal sinuses and mastoid air cells are clear. Right lens replacement. Otherwise orbits are unremarkable. Other: None. IMPRESSION: Negative for acute traumatic injury. Electronically Signed   By: Tish Frederickson M.D.   On: 02/09/2021 19:42   DG Chest Portable 1 View  Result Date: 02/09/2021 CLINICAL DATA:  Altered mental status.  Hypertension. EXAM: PORTABLE CHEST 1 VIEW  COMPARISON:  None. FINDINGS: The heart size and mediastinal contours are within normal limits. Ectasia and atherosclerotic calcification of thoracic aorta noted. Both lungs are clear. The visualized skeletal structures are unremarkable. IMPRESSION: No active disease. Electronically Signed   By: Danae Orleans M.D.   On: 02/09/2021 19:03   ECHOCARDIOGRAM LIMITED  Result Date: 02/10/2021    ECHOCARDIOGRAM LIMITED REPORT   Patient Name:   [name] Date of Exam: 02/10/2021 Medical Rec #:  [id number]      Height:       68.0 in Accession #:    [id number]     Weight:       158.0 lb Date of Birth:  [birth year]       BSA:          1.849 m Patient Age:    80 years       BP:           125/68 mmHg Patient Gender: M              HR:           74 bpm. Exam Location:  Inpatient Procedure: Limited Color Doppler and Color Doppler Indications:    Elevated troponin  History:        Patient has prior history of Echocardiogram examinations, most                 recent 05/04/2020. COPD, Signs/Symptoms:Altered Mental Status;                 Risk Factors:Former Smoker. Parkinsons's dementia.  Sonographer:    Roosvelt Maser RDCS Referring Phys: [id number] CHRISTOPHER P DANFORD  Sonographer Comments: Technically challenging study due to limited acoustic windows, Technically difficult study due to poor echo windows, suboptimal parasternal window, suboptimal subcostal window and no apical window. Image acquisition challenging due to COPD. IMPRESSIONS  1. Extremely limited study with just a few views.  2. Left ventricular ejection fraction, by estimation, is 65 to 70% based on brief glimpses of the myocardium mainly in parasternal and apical views. The left ventricle has  normal function. Left ventricular endocardial border not optimally defined to evaluate regional wall motion.  3. Right ventricular systolic function is normal. The right ventricular size is not well visualized. Comparison(s): Prior images reviewed side by side. LVEF looks to be  grossly normal range as before. FINDINGS  Left Ventricle: Left ventricular ejection fraction, by estimation, is 65 to 70%. The left ventricle has normal function. Left ventricular endocardial border not optimally defined to evaluate regional wall motion. Right Ventricle: The right ventricular size is not well visualized. Right vetricular wall thickness was not assessed. Right ventricular systolic function is normal. Samuel Mcdowell MD ElectNona Dellronically signed by Nona DellSamuel Mcdowell MD Signature Date/Time: 02/10/2021/11:47:27 AM    Final     Scheduled Meds:  busPIRone  10 mg Oral BID   carbidopa-levodopa  1 tablet Oral TID   mirtazapine  7.5 mg Oral QHS   QUEtiapine  25-50 mg Oral QHS   rivastigmine  1.5 mg Oral BID   rosuvastatin  10 mg Oral Daily   vitamin B-12  1,000 mcg Oral Daily   Continuous Infusions:   LOS: 2 days    Time spent: 25 mins    Cipriano BunkerPARDEEP Inola Lisle, MD [facility]   If 7PM-7AM, please contact night-coverage

## 2021-02-11 NOTE — Plan of Care (Signed)

## 2021-02-11 NOTE — Progress Notes (Addendum)
Critical lab called, K=2.6 from 3.0 yesterday.  Telemetry on NSR.  Name of Provider Notified: X. Blount  Date/Time Provider Notified: 02/11/2021; 0400  Orders/Reply Received:  Awaiting reply/order.  Order received at 0410 for 6 runs of KCL IVPB.

## 2021-02-12 DIAGNOSIS — G9340 Encephalopathy, unspecified: Secondary | ICD-10-CM | POA: Diagnosis not present

## 2021-02-12 DIAGNOSIS — N179 Acute kidney failure, unspecified: Secondary | ICD-10-CM | POA: Diagnosis not present

## 2021-02-12 DIAGNOSIS — I5032 Chronic diastolic (congestive) heart failure: Secondary | ICD-10-CM | POA: Diagnosis not present

## 2021-02-12 DIAGNOSIS — I959 Hypotension, unspecified: Secondary | ICD-10-CM | POA: Diagnosis not present

## 2021-02-12 LAB — URINE CULTURE: Culture: >=100000 — AB

## 2021-02-12 NOTE — Progress Notes (Signed)
Daily Progress Note   Patient Name: [name]       Date: 02/12/2021 DOB: Oct 08, 1940  Age: 80 y.o. MRN#: [id number] Attending Physician: Cipriano Bunker, MD Primary Care Physician: [name], MD Admit Date: 02/09/2021  Reason for Consultation/Follow-up: goals of care  Subjective: 12:30 - Chart reviewed. I went to see patient at bedside - he is sleeping throughout the duration of my visit. His daughter [name] is at bedside. She reports that he has eaten very little today; he drank a cup of coffee and ate 2 bites of eggs. We discuss his decline over the past few weeks and months. [name] verbalizes feeling that her father is approaching EOL - she reports that he has been making statements to the family about "being ready" and "wanting to go home". He has also stated "this is no way for a man to live". She has encouraged the family to "not be selfish" in trying to keep him. She reports his wife [name] is struggling with the concept he is at EOL and may be in denial.  19:20 - I spoke with wife [name] by phone. We discussed [name] current medical condition in detail. I expressed concern about his poor oral intake and provided education this is an indicator he is approaching EOL. Provided education and counseling on the philosophy and benefits of hospice care. Discussed that it offers a holistic approach to care in the setting of end-stage illness/disease, and is about supporting the patient where they are while allowing the natural course to occur. Discussed the hospice team can provide personal care, support for the family, and help keep patient out of the hospital.  I acknowledged the excellent care that [name] has provided to Mr. Pehl at home. [name] states it is becoming  increasingly difficult to care for him. She is also having a hard time accepting his decline. Created space and opportunity for her to express thoughts and feelings regarding patient's current medical situation. Emotional support provided    Length of Stay: 3   Physical Exam Vitals reviewed.  Constitutional:      General: He is not in acute distress.    Appearance: He is cachectic. He is ill-appearing.  Pulmonary:     Effort: Pulmonary effort is normal.  Neurological:     Motor: Weakness present.  Vital Signs: BP (!) 174/85 (BP Location: Left Arm)   Pulse 64   Temp 97.6 F (36.4 C) (Axillary)   Resp 20   Wt 55.8 kg   SpO2 98%   BMI 18.70 kg/m  SpO2: SpO2: 98 % O2 Device: O2 Device: Room Air O2 Flow Rate:    Intake/output summary:  Intake/Output Summary (Last 24 hours) at 02/12/2021 1344 Last data filed at 02/12/2021 5009 Gross per 24 hour  Intake 853.41 ml  Output 550 ml  Net 303.41 ml   LBM: Last BM Date: 02/11/21 Baseline Weight: Weight: 52.7 kg Most recent weight: Weight: 55.8 kg       Palliative Assessment/Data: PPS 20%      Palliative Care Assessment & Plan   Patient Profile: 80 y.o. male  with past medical history of chronic diastolic heart failure, chronic anemia, hyperlipidemia, hypertension, BPH, advanced Parkinson's disease with dementia, stage IIIa chronic kidney disease presented to the ED on 02/09/21 from [facility] due to an unresponsive episode. The patient's wife reports that the patient has exhibited progressive increased somnolence, diminished responsiveness, with minimal PO intake over the last week. Patient was found to be hypotensive in ED. Patient was admitted on 02/09/2021 with acute encephalopathy, AKI, elevated troponin, hypotension, hypokalemia, lactic acidosis.    Of note, patient was previously seen by PMT in December 2021 and had a recent outpatient Palliative Care visit about 10 days ago with [facility].    Assessment: Acute metabolic encephalopathy AKI on CKD stage 3a Acute on chronic anemia Parkinson's disease with dementia Severe protein calorie malnutrition  Recommendations/Plan: Continue current supportive medical care Patient has clearly declined in the setting of advanced Parkinson's and has expressed to family that he ready for EOL Patient would be be eligible for home hospice, also may be eligible for residential hospice Plan for follow-up discussion with wife tomorrow at bedside  Goals of Care and Additional Recommendations: Limitations on Scope of Treatment: No Artificial Feeding  Code Status: DNR/DNI   Prognosis: Difficult to determine, weeks if oral intake remains poor  Discharge Planning: To Be Determined    Thank you for allowing the Palliative Medicine Team to assist in the care of this patient.   Total Time 35 minutes Prolonged Time Billed  no       Greater than 50%  of this time was spent counseling and coordinating care related to the above assessment and plan.  Merry Proud, NP  Please contact Palliative Medicine Team phone at [phone] for questions and concerns.

## 2021-02-12 NOTE — Progress Notes (Signed)
PT Cancellation Note  Patient Details Name: [name] MRN: [id number] DOB: [birth year]   Cancelled Treatment:    Reason Eval/Treat Not Completed: Other (comment) per palliative notes, pt with very poor prognosis/potentially at EOL. Have reached out to medical team for guidance on PT role in this case- may potentially sign off pending discussion with MD.   [name] PT, DPT, PN2   Supplemental Physical Therapist [facility]    Pager [phone] Acute Rehab Office [phone]

## 2021-02-12 NOTE — Progress Notes (Signed)
PROGRESS NOTE    [name]  PPI:[id number] DOB: 10/10/1940 DOA: 02/09/2021 PCP: [name], MD   Brief Narrative:  This 80 years old male with PMH of Parkinson's disease, advanced dementia, now bedbound, lives at home, anemia of chronic disease, diastolic CHF, CKD stage III with baseline serum creatinine 1.5 and history of nephrolithiasis last year requiring a stent for sepsis who presented with 2 weeks history of progressive decline in oral intake and then a passing out spell. Wife reported over the last several weeks he has been eating and drinking less, holding food in his mouth and remains drowsy. On the day of admission he had a passing out spell and was less responsive than usual so called EMS.  In the ER and blood pressure found to be 80/ 50 lactate 3.0 creatinine 2.1 from baseline potassium 2.6 he was given IV fluids and blood pressure improved with resolution of lactic acidosis.  Patient is admitted for dehydration.  Assessment & Plan:   Principal Problem:   Acute encephalopathy Active Problems:   Dyslipidemia   Hypokalemia   Parkinson's syndrome (HCC)   AKI (acute kidney injury) (HCC)   Elevated troponin   Hypotension   Lactic acidosis   Chronic diastolic CHF (congestive heart failure) (HCC)   Palliative care by specialist   Goals of care, counseling/discussion   DNR (do not resuscitate)   DNI (do not intubate)   Encounter for hospice care discussion   Concern about end of life   Decreased oral intake  Acute metabolic encephalopathy.  Multifactorial. At baseline patient is interactive, recognizes wife, but is bedbound, has to be fed, and would not recognize strangers or know where he was.  On admission he was somnolent and less responsive than baseline. CT head unremarkable, CXR clear.  WBC normal.  He has improved with fluids.  It could be multifactorial, UTI, anemia,  may be dehydration from poor p.o. intake as a result of progression of Parkinson's  disease. Continue gentle IV hydration. Palliative care consulted to discuss goals of care.   Options of comfort measures were discussed , family will make a final decision today. Avoid sedating medications.  AKI on CKD stage IIIa: Now resolved,  serum creatinine back to baseline.   Acute on chronic anemia   Liekly baseline anemia of chronic disease, Hgb 9-10 g/dL. Hemoglobin dropped to 6.5 from 9.0 No clinical bleeding at all, may be hemodilution all. S/p 1 PRBC  Hemoglobin 9.3 posttransfusion. Check iron studies and B12.  Parkinson's disease :   Continue rivastigmine and Sinemet.  Dementia: Continue BuSpar and quetiapine  Elevated troponin : Ischemia ruled out.  Echo normal.   Hypokalemia: Due to poor p.o. intake, Replaced, continue to monitor.  Pyuria UA from June showed no WBC, bacteria, and now is changed.  However, no urinary symptoms.   His lactic acidosis and hypotension on admission were certainly not from sepsis, even if he does turn out to have a cystitis. Urine culture grew E. Coli, awaiting family decision about comfort care.    Severe protein calorie malnutrition As evidenced by greater than 15 kg weight loss over the last year, Parkinson's, and diffuse subcutaneous muscle mass and fat loss     DVT prophylaxis: SCDs Code Status: DNR Family Communication: No family at bed side. Disposition Plan:    Status is: Inpatient  Remains inpatient appropriate because:Inpatient level of care appropriate due to severity of illness  Dispo: The patient is from: Home  Anticipated d/c is to: SNF              Patient currently is not medically stable to d/c.   Difficult to place patient No Consultants:  Palliative care.  Procedures: None Antimicrobials: None.  Subjective: Patient was seen and examined at bedside.  Overnight events noted.   Patient is alert and oriented x 1 today. Appears comfortable, not in any acute  distress.  Objective: Vitals:   02/12/21 0455 02/12/21 0458 02/12/21 0800 02/12/21 1204  BP: (!) 180/86  (!) 164/84 (!) 174/85  Pulse: 64  70 64  Resp: 16  18 20   Temp: (!) 97.5 F (36.4 C)  97.8 F (36.6 C) 97.6 F (36.4 C)  TempSrc: Oral  Axillary Axillary  SpO2: 94%  97% 98%  Weight:  55.8 kg      Intake/Output Summary (Last 24 hours) at 02/12/2021 1451 Last data filed at 02/12/2021 1431 Gross per 24 hour  Intake 1093.41 ml  Output 775 ml  Net 318.41 ml   Filed Weights   02/11/21 0444 02/12/21 0458  Weight: 52.7 kg 55.8 kg    Examination:  General exam: Appears comfortable, thin built, chronically deconditioned.  Not in distress. Respiratory system: Clear to auscultation bilaterally, decreased breath sounds. Cardiovascular system: S1-S2 heard, regular rate and rhythm, no murmur. Gastrointestinal system: Abdomen is soft, nontender, nondistended, bowel sounds positive Central nervous system: Alert and arousable.  No focal neurological deficits. Extremities: Moves all extremities. Skin: No rashes, lesions or ulcers Psychiatry: Mood and affect appropriate    Data Reviewed: I have personally reviewed following labs and imaging studies  CBC: Recent Labs  Lab 02/09/21 1815 02/09/21 1832 02/10/21 0204 02/10/21 0726 02/11/21 0132  WBC 5.4  --  5.1  --  6.3  NEUTROABS 4.6  --  3.7  --   --   HGB 9.0* 8.5* 7.8* 6.5* 9.3*  HCT 27.7* 25.0* 22.8* 19.0* 27.3*  MCV 87.7  --  84.4  --  85.8  PLT 163  --  135*  --  133*   Basic Metabolic Panel: Recent Labs  Lab 02/09/21 1815 02/09/21 1832 02/10/21 0204 02/10/21 0726 02/11/21 0132 02/11/21 1637  NA 142 141 137 142 140  --   K 2.6* 2.5* 3.3* 3.0* 2.6* 3.8  CL 104 102 103  --  104  --   CO2 28  --  27  --  28  --   GLUCOSE 118* 110* 101*  --  99  --   BUN 33* 30* 34*  --  30*  --   CREATININE 2.10* 1.80* 1.95*  --  1.63*  --   CALCIUM 8.0*  --  7.6*  --  7.8*  --   MG 1.9  --  2.0  --   --   --   PHOS 2.8  --    --   --   --   --    GFR: Estimated Creatinine Clearance: 28.1 mL/min (A) (by C-G formula based on SCr of 1.63 mg/dL (H)). Liver Function Tests: Recent Labs  Lab 02/09/21 1815 02/10/21 0204  AST 8* <5*  ALT <5 <5  ALKPHOS 88 76  BILITOT 1.9* 1.6*  PROT 4.8* 4.3*  ALBUMIN 2.3* 2.0*   Recent Labs  Lab 02/09/21 1815  LIPASE 24   No results for input(s): AMMONIA in the last 168 hours. Coagulation Profile: Recent Labs  Lab 02/10/21 0204  INR 1.1   Cardiac Enzymes: Recent Labs  Lab 02/09/21  1815  CKTOTAL 20*   BNP (last 3 results) No results for input(s): PROBNP in the last 8760 hours. HbA1C: No results for input(s): HGBA1C in the last 72 hours. CBG: No results for input(s): GLUCAP in the last 168 hours. Lipid Profile: No results for input(s): CHOL, HDL, LDLCALC, TRIG, CHOLHDL, LDLDIRECT in the last 72 hours. Thyroid Function Tests: Recent Labs    02/11/21 0132  TSH 1.093   Anemia Panel: Recent Labs    02/10/21 1658  FERRITIN 518*  TIBC 112*  IRON 97   Sepsis Labs: Recent Labs  Lab 02/09/21 1809 02/09/21 2119  LATICACIDVEN 3.0* 1.7    Recent Results (from the past 240 hour(s))  Resp Panel by RT-PCR (Flu A&B, Covid) Nasopharyngeal Swab     Status: None   Collection Time: 02/09/21  6:17 PM   Specimen: Nasopharyngeal Swab; Nasopharyngeal(NP) swabs in vial transport medium  Result Value Ref Range Status   SARS Coronavirus 2 by RT PCR NEGATIVE NEGATIVE Final    Comment: (NOTE) SARS-CoV-2 target nucleic acids are NOT DETECTED.  The SARS-CoV-2 RNA is generally detectable in upper respiratory specimens during the acute phase of infection. The lowest concentration of SARS-CoV-2 viral copies this assay can detect is 138 copies/mL. A negative result does not preclude SARS-Cov-2 infection and should not be used as the sole basis for treatment or other patient management decisions. A negative result may occur with  improper specimen collection/handling,  submission of specimen other than nasopharyngeal swab, presence of viral mutation(s) within the areas targeted by this assay, and inadequate number of viral copies(<138 copies/mL). A negative result must be combined with clinical observations, patient history, and epidemiological information. The expected result is Negative.  Fact Sheet for Patients:  BloggerCourse.com  Fact Sheet for Healthcare Providers:  SeriousBroker.it  This test is no t yet approved or cleared by the [place] FDA and  has been authorized for detection and/or diagnosis of SARS-CoV-2 by FDA under an Emergency Use Authorization (EUA). This EUA will remain  in effect (meaning this test can be used) for the duration of the COVID-19 declaration under Section 564(b)(1) of the Act, 21 U.S.C.section 360bbb-3(b)(1), unless the authorization is terminated  or revoked sooner.       Influenza A by PCR NEGATIVE NEGATIVE Final   Influenza B by PCR NEGATIVE NEGATIVE Final    Comment: (NOTE) The Xpert Xpress SARS-CoV-2/FLU/RSV plus assay is intended as an aid in the diagnosis of influenza from Nasopharyngeal swab specimens and should not be used as a sole basis for treatment. Nasal washings and aspirates are unacceptable for Xpert Xpress SARS-CoV-2/FLU/RSV testing.  Fact Sheet for Patients: BloggerCourse.com  Fact Sheet for Healthcare Providers: SeriousBroker.it  This test is not yet approved or cleared by the [place] FDA and has been authorized for detection and/or diagnosis of SARS-CoV-2 by FDA under an Emergency Use Authorization (EUA). This EUA will remain in effect (meaning this test can be used) for the duration of the COVID-19 declaration under Section 564(b)(1) of the Act, 21 U.S.C. section 360bbb-3(b)(1), unless the authorization is terminated or revoked.  Performed at [facility], [address] [place], [place] [address]   Blood culture (routine x 2)     Status: None (Preliminary result)   Collection Time: 02/09/21 10:45 PM   Specimen: BLOOD RIGHT FOREARM  Result Value Ref Range Status   Specimen Description BLOOD RIGHT FOREARM  Final   Special Requests   Final    BOTTLES DRAWN AEROBIC  AND ANAEROBIC Blood Culture adequate volume   Culture   Final    NO GROWTH 2 DAYS Performed at [facility], [address] [place], [place] [address]    Report Status PENDING  Incomplete  Urine Culture     Status: Abnormal   Collection Time: 02/10/21 11:42 AM   Specimen: Urine, Catheterized  Result Value Ref Range Status   Specimen Description URINE, CATHETERIZED  Final   Special Requests   Final    NONE Performed at [facility], [address] [place], [place] [address]    Culture >=100,000 COLONIES/mL ESCHERICHIA COLI (A)  Final   Report Status 02/12/2021 FINAL  Final   Organism ID, Bacteria ESCHERICHIA COLI (A)  Final      Susceptibility   Escherichia coli - MIC*    AMPICILLIN 8 SENSITIVE Sensitive     CEFAZOLIN <=4 SENSITIVE Sensitive     CEFEPIME <=0.12 SENSITIVE Sensitive     CEFTRIAXONE <=0.25 SENSITIVE Sensitive     CIPROFLOXACIN <=0.25 SENSITIVE Sensitive     GENTAMICIN <=1 SENSITIVE Sensitive     IMIPENEM <=0.25 SENSITIVE Sensitive     NITROFURANTOIN <=16 SENSITIVE Sensitive     TRIMETH/SULFA <=20 SENSITIVE Sensitive     AMPICILLIN/SULBACTAM 4 SENSITIVE Sensitive     PIP/TAZO <=4 SENSITIVE Sensitive     * >=100,000 COLONIES/mL ESCHERICHIA COLI    Radiology Studies: No results found.  Scheduled Meds:  busPIRone  10 mg Oral BID   carbidopa-levodopa  1 tablet Oral TID   mirtazapine  7.5 mg Oral QHS   QUEtiapine  25-50 mg Oral QHS   rivastigmine  1.5 mg Oral BID   rosuvastatin  10 mg Oral Daily   vitamin B-12  1,000 mcg Oral Daily   Continuous Infusions:   LOS: 3 days    Time spent: 25 mins    Cipriano BunkerPARDEEP Aleeza [place], MD [facility]   If 7PM-7AM, please contact night-coverage

## 2021-02-12 NOTE — Progress Notes (Signed)
PT Cancellation Note  Patient Details Name: JUNAID WURZER MRN: [id number] DOB: 27-Jun-1940   Cancelled Treatment:    Reason Eval/Treat Not Completed: Other (comment) per discussion with MD, no longer appropriate for PT. Signing off- thank you for the opportunity to participate in his care!   Madelaine Etienne, DPT, PN2   Supplemental Physical Therapist [facility]    Pager [phone] Acute Rehab Office [phone]

## 2021-02-13 DIAGNOSIS — G9340 Encephalopathy, unspecified: Secondary | ICD-10-CM | POA: Diagnosis not present

## 2021-02-13 DIAGNOSIS — N179 Acute kidney failure, unspecified: Secondary | ICD-10-CM | POA: Diagnosis not present

## 2021-02-13 DIAGNOSIS — G20 Parkinson's disease: Secondary | ICD-10-CM | POA: Diagnosis not present

## 2021-02-13 DIAGNOSIS — R638 Other symptoms and signs concerning food and fluid intake: Secondary | ICD-10-CM | POA: Diagnosis not present

## 2021-02-13 LAB — METHYLMALONIC ACID, SERUM: Methylmalonic Acid, Quantitative: 152 nmol/L (ref 0–378)

## 2021-02-13 MED ORDER — GLYCOPYRROLATE 0.2 MG/ML IJ SOLN: 0.2000 mg | INTRAMUSCULAR | Status: DC | PRN

## 2021-02-13 MED ORDER — BIOTENE DRY MOUTH MT LIQD: 15.0000 mL | OROMUCOSAL | Status: DC | PRN

## 2021-02-13 MED ORDER — MORPHINE SULFATE (CONCENTRATE) 10 MG/0.5ML PO SOLN: 5.0000 mg | ORAL | Status: DC | PRN

## 2021-02-13 MED ORDER — POLYVINYL ALCOHOL 1.4 % OP SOLN: 1.0000 [drp] | Freq: Four times a day (QID) | OPHTHALMIC | Status: DC | PRN

## 2021-02-13 MED ORDER — GLYCOPYRROLATE 1 MG PO TABS
1.0000 mg | ORAL_TABLET | ORAL | Status: DC | PRN
  Filled 2021-02-13: qty 1

## 2021-02-13 MED ORDER — HALOPERIDOL LACTATE 2 MG/ML PO CONC
0.5000 mg | ORAL | Status: DC | PRN
  Filled 2021-02-13: qty 0.3

## 2021-02-13 MED ORDER — HALOPERIDOL LACTATE 5 MG/ML IJ SOLN
0.5000 mg | INTRAMUSCULAR | Status: DC | PRN
  Administered 2021-02-13: 0.5 mg via INTRAVENOUS
  Filled 2021-02-13: qty 1

## 2021-02-13 MED ORDER — HALOPERIDOL 0.5 MG PO TABS
0.5000 mg | ORAL_TABLET | ORAL | Status: DC | PRN
  Filled 2021-02-13: qty 1

## 2021-02-13 NOTE — Progress Notes (Signed)
Daily Progress Note   Patient Name: [name]       Date: 02/13/2021 DOB: 06-Oct-1940  Age: 80 y.o. MRN#: [id number] Attending Physician: Shawna Clamp, MD Primary Care Physician: Cassandria Anger, MD Admit Date: 02/09/2021  Reason for Consultation/Follow-up: goals of care  Subjective: Met with wife [name] at bedside. Reviewed at length patient's past medical history and current medical condition. When I asked [name] what her hopes and expectations were for [name], she replies "to get better". I expressed concern that given the advanced state of his illness, that improvement would not be a realistic expectation at this point.   Detailed discussion was had regarding the diagnosis of Parkinson's and its natural trajectory. We reviewed that Parkinson's is a progressive, non-curable disease underlying the patient's current acute medical conditions. We reviewed specific indicators that patient had reached end stages, including bed bound/non-ambulatory status, decreased oral intake, and incontinence of bowel/bladder.   [name] has called her daughter [name] for support and placed her on speaker phone to participate in the discussion. I express concern to [facility] regarding [name]'s goal of improvement. [name] agrees that this is not a realistic goal and acknowledges that her mother is struggling with the concept that [name] is approaching EOL. Discussed that decreased oral intake is an important prognostic indicator that a patient is at EOL. Created space and opportunity for family to express thoughts and feelings regarding patient's current medical situation. Emotional support provided.  Reviewed the concept of a comfort path with family, emphasizing that this path involves  de-escalating and stopping full scope medical interventions, allowing a natural course to occur. Discussed that the goal is comfort and dignity rather than cure/prolonging life. Reviewed hospice philosophy and discussed difference between home vs residential hospice services.  Discussed transitioning to comfort care while in the hospital, and what that would look like--keeping him clean and dry, no labs, no artificial hydration or feeding, no antibiotics, minimizing of medications, discontinuing cardiac monitoring, comfort feeds, and medication for pain and dyspnea as needed.   After additional discussion, [name] and [name] are in agreement for transition to comfort care and transfer to residential hospice. They are deciding between [facility] and the facility in [facility].    Length of Stay: 4  Current Medications: Scheduled Meds:   busPIRone  10 mg  Oral BID   carbidopa-levodopa  1 tablet Oral TID   mirtazapine  7.5 mg Oral QHS   QUEtiapine  25-50 mg Oral QHS   rivastigmine  1.5 mg Oral BID    PRN Meds: acetaminophen **OR** acetaminophen, antiseptic oral rinse, glycopyrrolate **OR** glycopyrrolate **OR** glycopyrrolate, haloperidol **OR** haloperidol **OR** haloperidol lactate, morphine CONCENTRATE, polyvinyl alcohol       Palliative Assessment/Data: PPS 20%      Palliative Care Assessment & Plan   Patient Profile: 80 y.o. male  with past medical history of chronic diastolic heart failure, chronic anemia, hyperlipidemia, hypertension, BPH, advanced Parkinson's disease with dementia, stage IIIa chronic kidney disease presented to the ED on 02/09/21 from [facility] due to an unresponsive episode. The patient's wife reports that the patient has exhibited progressive increased somnolence, diminished responsiveness, with minimal PO intake over the last week. Patient was found to be hypotensive in ED. Patient was admitted on 02/09/2021 with acute encephalopathy, AKI, elevated troponin,  hypotension, hypokalemia, lactic acidosis.    Of note, patient was previously seen by PMT in December 2021 and had a recent outpatient Palliative Care visit about 10 days ago with [facility].    Assessment: Acute metabolic encephalopathy AKI on CKD stage 3a Acute on chronic anemia Parkinson's disease with dementia Severe protein calorie malnutrition  Recommendations/Plan: Full comfort measures initiated Family deciding between [employer] [facility] versus facility in [employer] medication are available for symptom management at EOL PMT will continue to follow and support  Goals of Care and Additional Recommendations: Limitations on Scope of Treatment: Full Comfort Care  Code Status: DNR/DNI  Prognosis:  < 2 weeks  Discharge Planning: Hospice facility  Care plan was discussed with Dr. Dwyane Dee and [facility] team  Thank you for allowing the Palliative Medicine Team to assist in the care of this patient.   Total Time 67 minutes Prolonged Time Billed  yes       Greater than 50%  of this time was spent counseling and coordinating care related to the above assessment and plan.  Lavena Bullion, NP  Please contact Palliative Medicine Team phone at [phone] for questions and concerns.

## 2021-02-13 NOTE — Progress Notes (Signed)
PROGRESS NOTE    [name]  UXN:[id number] DOB: Apr 13, 1941 DOA: 02/09/2021 PCP: [name], MD   Brief Narrative:  This 80 years old male with PMH of Parkinson's disease, advanced dementia, now bedbound, lives at home, anemia of chronic disease, diastolic CHF, CKD stage III with baseline serum creatinine 1.5 and history of nephrolithiasis last year requiring a stent for sepsis who presented with 2 weeks history of progressive decline in oral intake and then a passing out spell. Wife reported over the last several weeks he has been eating and drinking less, holding food in his mouth and remains drowsy. On the day of admission he had a passing out spell and was less responsive than usual so called EMS.  In the ER and blood pressure found to be 80/ 50, lactate 3.0 creatinine 2.1 from baseline potassium 2.6 he was given IV fluids and blood pressure improved with resolution of lactic acidosis.  Patient is admitted for dehydration.  Palliative care consulted to discuss goals of care.  Family has agreed to comfort care and residential hospice.  Assessment & Plan:   Principal Problem:   Acute encephalopathy Active Problems:   Dyslipidemia   Hypokalemia   Parkinson's syndrome (HCC)   AKI (acute kidney injury) (HCC)   Elevated troponin   Hypotension   Lactic acidosis   Chronic diastolic CHF (congestive heart failure) (HCC)   Palliative care by specialist   Goals of care, counseling/discussion   DNR (do not resuscitate)   DNI (do not intubate)   Encounter for hospice care discussion   Concern about end of life   Decreased oral intake  Acute metabolic encephalopathy.  Multifactorial. At baseline patient is interactive, recognizes wife, but is bedbound, has to be fed, and would not recognize strangers or know where he was.   On admission he was somnolent and less responsive than baseline. CT head unremarkable, CXR clear.  WBC normal.  He has improved with fluids.  It could be  multifactorial, UTI, anemia,  may be dehydration from poor p.o. intake as a result of progression of Parkinson's disease. Continue gentle IV hydration. Palliative care consulted to discuss goals of care.  Family has agreed to comfort care and residential hospice. Options of comfort measures were discussed , family will decide about location residential hospice [facility] Avoid sedating medications.  AKI on CKD stage IIIa: Now resolved,  serum creatinine back to baseline.   Acute on chronic anemia   Liekly baseline anemia of chronic disease, Hgb 9-10 g/dL. Hemoglobin dropped to 6.5 from 9.0 No clinical bleeding at all, may be hemodilution all. S/p 1 PRBC  Hemoglobin 9.3 posttransfusion.   Parkinson's disease :   Continue rivastigmine and Sinemet.  Dementia: Continue BuSpar and quetiapine.  Elevated troponin : Ischemia ruled out.  Echo normal.   Hypokalemia: Due to poor p.o. intake, Replaced, continue to monitor.  Pyuria UA from June showed no WBC, bacteria, and now is changed.  However, no urinary symptoms.   His lactic acidosis and hypotension on admission were certainly not from sepsis, even if he does turn out to have a cystitis. Urine culture grew E. Coli, awaiting family decision about comfort care. Family wants comfort care.  No antibiotics    Severe protein calorie malnutrition As evidenced by greater than 15 kg weight loss over the last year, Parkinson's, and diffuse subcutaneous muscle mass and fat loss     DVT prophylaxis: SCDs Code Status: DNR Family Communication: No family at bed side. Disposition  Plan:    Status is: Inpatient  Remains inpatient appropriate because:Inpatient level of care appropriate due to severity of illness  Dispo: The patient is from: Home              Anticipated d/c is to:  Comfort care.              Patient currently is not medically stable to d/c.   Difficult to place patient No Consultants:  Palliative care.  Procedures:  None Antimicrobials: None.  Subjective: Patient was seen and examined at bedside.  Overnight events noted.   Patient is alert and oriented x1. Appears comfortable, not in any acute distress.  Objective: Vitals:   02/12/21 1817 02/12/21 2038 02/13/21 0435 02/13/21 0842  BP: (!) 153/82 (!) 158/93 (!) 151/92 (!) 181/95  Pulse: 67 67 66 66  Resp: 16 16 16 16   Temp: (!) 97.4 F (36.3 C) 98.1 F (36.7 C) 97.6 F (36.4 C) 97.7 F (36.5 C)  TempSrc:  Oral Oral Oral  SpO2: 100% 99% 99% 99%  Weight:        Intake/Output Summary (Last 24 hours) at 02/13/2021 1525 Last data filed at 02/13/2021 1300 Gross per 24 hour  Intake 180 ml  Output 250 ml  Net -70 ml   Filed Weights   02/11/21 0444 02/12/21 0458  Weight: 52.7 kg 55.8 kg    Examination:  General exam: Appears comfortable, thin built, chronically deconditioned.  Not in distress. Respiratory system: Clear to auscultation bilaterally, decreased breath sounds. Cardiovascular system: S1-S2 heard, regular rate and rhythm, no murmur. Gastrointestinal system: Abdomen is soft, nontender, nondistended, bowel sounds positive Central nervous system: Alert and arousable.  No focal neurological deficits. Extremities: Moves all extremities. Skin: No rashes, lesions or ulcers Psychiatry: Mood and affect appropriate    Data Reviewed: I have personally reviewed following labs and imaging studies  CBC: Recent Labs  Lab 02/09/21 1815 02/09/21 1832 02/10/21 0204 02/10/21 0726 02/11/21 0132  WBC 5.4  --  5.1  --  6.3  NEUTROABS 4.6  --  3.7  --   --   HGB 9.0* 8.5* 7.8* 6.5* 9.3*  HCT 27.7* 25.0* 22.8* 19.0* 27.3*  MCV 87.7  --  84.4  --  85.8  PLT 163  --  135*  --  133*   Basic Metabolic Panel: Recent Labs  Lab 02/09/21 1815 02/09/21 1832 02/10/21 0204 02/10/21 0726 02/11/21 0132 02/11/21 1637  NA 142 141 137 142 140  --   K 2.6* 2.5* 3.3* 3.0* 2.6* 3.8  CL 104 102 103  --  104  --   CO2 28  --  27  --  28  --    GLUCOSE 118* 110* 101*  --  99  --   BUN 33* 30* 34*  --  30*  --   CREATININE 2.10* 1.80* 1.95*  --  1.63*  --   CALCIUM 8.0*  --  7.6*  --  7.8*  --   MG 1.9  --  2.0  --   --   --   PHOS 2.8  --   --   --   --   --    GFR: Estimated Creatinine Clearance: 28.1 mL/min (A) (by C-G formula based on SCr of 1.63 mg/dL (H)). Liver Function Tests: Recent Labs  Lab 02/09/21 1815 02/10/21 0204  AST 8* <5*  ALT <5 <5  ALKPHOS 88 76  BILITOT 1.9* 1.6*  PROT 4.8* 4.3*  ALBUMIN 2.3*  2.0*   Recent Labs  Lab 02/09/21 1815  LIPASE 24   No results for input(s): AMMONIA in the last 168 hours. Coagulation Profile: Recent Labs  Lab 02/10/21 0204  INR 1.1   Cardiac Enzymes: Recent Labs  Lab 02/09/21 1815  CKTOTAL 20*   BNP (last 3 results) No results for input(s): PROBNP in the last 8760 hours. HbA1C: No results for input(s): HGBA1C in the last 72 hours. CBG: No results for input(s): GLUCAP in the last 168 hours. Lipid Profile: No results for input(s): CHOL, HDL, LDLCALC, TRIG, CHOLHDL, LDLDIRECT in the last 72 hours. Thyroid Function Tests: Recent Labs    02/11/21 0132  TSH 1.093   Anemia Panel: Recent Labs    02/10/21 1658  FERRITIN 518*  TIBC 112*  IRON 97   Sepsis Labs: Recent Labs  Lab 02/09/21 1809 02/09/21 2119  LATICACIDVEN 3.0* 1.7    Recent Results (from the past 240 hour(s))  Resp Panel by RT-PCR (Flu A&B, Covid) Nasopharyngeal Swab     Status: None   Collection Time: 02/09/21  6:17 PM   Specimen: Nasopharyngeal Swab; Nasopharyngeal(NP) swabs in vial transport medium  Result Value Ref Range Status   SARS Coronavirus 2 by RT PCR NEGATIVE NEGATIVE Final    Comment: (NOTE) SARS-CoV-2 target nucleic acids are NOT DETECTED.  The SARS-CoV-2 RNA is generally detectable in upper respiratory specimens during the acute phase of infection. The lowest concentration of SARS-CoV-2 viral copies this assay can detect is 138 copies/mL. A negative result does  not preclude SARS-Cov-2 infection and should not be used as the sole basis for treatment or other patient management decisions. A negative result may occur with  improper specimen collection/handling, submission of specimen other than nasopharyngeal swab, presence of viral mutation(s) within the areas targeted by this assay, and inadequate number of viral copies(<138 copies/mL). A negative result must be combined with clinical observations, patient history, and epidemiological information. The expected result is Negative.  Fact Sheet for Patients:  BloggerCourse.com  Fact Sheet for Healthcare Providers:  SeriousBroker.it  This test is no t yet approved or cleared by the [place] FDA and  has been authorized for detection and/or diagnosis of SARS-CoV-2 by FDA under an Emergency Use Authorization (EUA). This EUA will remain  in effect (meaning this test can be used) for the duration of the COVID-19 declaration under Section 564(b)(1) of the Act, 21 U.S.C.section 360bbb-3(b)(1), unless the authorization is terminated  or revoked sooner.       Influenza A by PCR NEGATIVE NEGATIVE Final   Influenza B by PCR NEGATIVE NEGATIVE Final    Comment: (NOTE) The Xpert Xpress SARS-CoV-2/FLU/RSV plus assay is intended as an aid in the diagnosis of influenza from Nasopharyngeal swab specimens and should not be used as a sole basis for treatment. Nasal washings and aspirates are unacceptable for Xpert Xpress SARS-CoV-2/FLU/RSV testing.  Fact Sheet for Patients: BloggerCourse.com  Fact Sheet for Healthcare Providers: SeriousBroker.it  This test is not yet approved or cleared by the [place] FDA and has been authorized for detection and/or diagnosis of SARS-CoV-2 by FDA under an Emergency Use Authorization (EUA). This EUA will remain in effect (meaning this test can be used) for the  duration of the COVID-19 declaration under Section 564(b)(1) of the Act, 21 U.S.C. section 360bbb-3(b)(1), unless the authorization is terminated or revoked.  Performed at [facility], [address], [place], [place] [address]   Blood culture (routine x 2)     Status:  None (Preliminary result)   Collection Time: 02/09/21 10:45 PM   Specimen: BLOOD RIGHT FOREARM  Result Value Ref Range Status   Specimen Description BLOOD RIGHT FOREARM  Final   Special Requests   Final    BOTTLES DRAWN AEROBIC AND ANAEROBIC Blood Culture adequate volume   Culture   Final    NO GROWTH 3 DAYS Performed at [facility], [address] [place], [place] [address]    Report Status PENDING  Incomplete  Urine Culture     Status: Abnormal   Collection Time: 02/10/21 11:42 AM   Specimen: Urine, Catheterized  Result Value Ref Range Status   Specimen Description URINE, CATHETERIZED  Final   Special Requests   Final    NONE Performed at [facility], [address] [place], [place] [address]    Culture >=100,000 COLONIES/mL ESCHERICHIA COLI (A)  Final   Report Status 02/12/2021 FINAL  Final   Organism ID, Bacteria ESCHERICHIA COLI (A)  Final      Susceptibility   Escherichia coli - MIC*    AMPICILLIN 8 SENSITIVE Sensitive     CEFAZOLIN <=4 SENSITIVE Sensitive     CEFEPIME <=0.12 SENSITIVE Sensitive     CEFTRIAXONE <=0.25 SENSITIVE Sensitive     CIPROFLOXACIN <=0.25 SENSITIVE Sensitive     GENTAMICIN <=1 SENSITIVE Sensitive     IMIPENEM <=0.25 SENSITIVE Sensitive     NITROFURANTOIN <=16 SENSITIVE Sensitive     TRIMETH/SULFA <=20 SENSITIVE Sensitive     AMPICILLIN/SULBACTAM 4 SENSITIVE Sensitive     PIP/TAZO <=4 SENSITIVE Sensitive     * >=100,000 COLONIES/mL ESCHERICHIA COLI    Radiology Studies: No results found.  Scheduled Meds:  busPIRone  10 mg Oral BID   carbidopa-levodopa  1 tablet Oral TID   mirtazapine  7.5 mg Oral QHS   QUEtiapine  25-50 mg Oral QHS   rivastigmine   1.5 mg Oral BID   rosuvastatin  10 mg Oral Daily   vitamin B-12  1,000 mcg Oral Daily   Continuous Infusions:   LOS: 4 days    Time spent: 25 mins    Cipriano Bunker, MD [facility]   If 7PM-7AM, please contact night-coverage

## 2021-02-13 NOTE — Plan of Care (Signed)

## 2021-02-14 DIAGNOSIS — G9340 Encephalopathy, unspecified: Secondary | ICD-10-CM | POA: Diagnosis not present

## 2021-02-14 DIAGNOSIS — N179 Acute kidney failure, unspecified: Secondary | ICD-10-CM | POA: Diagnosis not present

## 2021-02-14 DIAGNOSIS — G20 Parkinson's disease: Secondary | ICD-10-CM | POA: Diagnosis not present

## 2021-02-14 DIAGNOSIS — I5032 Chronic diastolic (congestive) heart failure: Secondary | ICD-10-CM | POA: Diagnosis not present

## 2021-02-14 NOTE — Progress Notes (Signed)
Daily Progress Note   Patient Name: [name]       Date: 02/14/2021 DOB: [birth year]  Age: 80 y.o. MRN#: [id number] Attending Physician: Lorin Glass, MD Primary Care Physician: [name], MD Admit Date: 02/09/2021  Reason for Consultation/Follow-up: end of life care  Subjective: Update received from unit staff. Patient has been sleeping most of the day and did not eat any breakfast.  On my assessment, he appears mildly restless. He does not engage in conversation other than to mumble a few words.   Wife Clide Cliff is at bedside. She has decided she would like him transferred to [facility], as it closer for her and most of the family. She expresses peace with the decision to focus on comfort for Mr. Corkins. She shares that he has told her "he is tired". She also states knowing his current state is "no way to live". Provided education on expectations at end of life. Created space and opportunity for [name] to express thoughts and feelings regarding patient's current medical situation. Emotional support provided.  Additional discussion had around the subject of comfort feeding. Provided education that at EOL, patients have minimal desire or need to eat or drink. Emphasized that he will likely only take bites and sips.     Length of Stay: 5  Vital Signs: BP (!) 161/87 (BP Location: Left Arm)   Pulse 83   Temp 97.9 F (36.6 C) (Oral)   Resp 15   Wt 55.8 kg   SpO2 96%   BMI 18.70 kg/m  SpO2: SpO2: 96 % O2 Device: O2 Device: Room Air O2 Flow Rate:     Palliative Assessment/Data: PPS 20%         Palliative Care Assessment & Plan   Patient Profile: 80 y.o. male  with past medical history of chronic diastolic heart failure, chronic anemia, hyperlipidemia,  hypertension, BPH, advanced Parkinson's disease with dementia, stage IIIa chronic kidney disease presented to the ED on 02/09/21 from [facility] due to an unresponsive episode. The patient's wife reports that the patient has exhibited progressive increased somnolence, diminished responsiveness, with minimal PO intake over the last week. Patient was found to be hypotensive in ED. Patient was admitted on 02/09/2021 with acute encephalopathy, AKI, elevated troponin, hypotension, hypokalemia, lactic acidosis.    Of note, patient was previously  seen by PMT in December 2021 and had a recent outpatient Palliative Care visit about 10 days ago with [facility].    Assessment: Acute metabolic encephalopathy AKI on CKD stage 3a Acute on chronic anemia Parkinson's disease with dementia Severe protein calorie malnutrition  Recommendations/Plan: Continue comfort measures Referral placed for [facility] PRN medications available for symptom management at EOL PMT will continue to follow and support  Goals of Care and Additional Recommendations: Limitations on Scope of Treatment: Full Comfort Care and No Artificial Feeding  Code Status: DNR/DNI  Prognosis:  < 2 weeks  Discharge Planning: To Be Determined    Thank you for allowing the Palliative Medicine Team to assist in the care of this patient.   Total Time 25 minutes Prolonged Time Billed  no       Greater than 50%  of this time was spent counseling and coordinating care related to the above assessment and plan.  Merry Proud, NP  Please contact Palliative Medicine Team phone at [phone] for questions and concerns.

## 2021-02-14 NOTE — Social Work (Signed)
CSW sent referral to [facility].   Jimmy Picket, [place] Clinical Social Worker [phone]

## 2021-02-14 NOTE — Progress Notes (Signed)
Referral PROGRESS NOTE  [name]  DOB: Nov 22, 1940  PCP: [name], MD YQM:[id number]  DOA: 02/09/2021  LOS: 5 days  Hospital Day: 6   Chief complaint: Progressive decline in syncope   Brief narrative: [name] is a 80 y.o. male with PMH significant for Parkinson's disease, advanced dementia, now bedbound, lives at home, anemia of chronic disease, diastolic CHF, CKD stage III with baseline serum creatinine 1.5 and history of nephrolithiasis last year requiring a stent for sepsis. Patient presented to the ED on 8/19 with 2 weeks history of progressive decline in oral intake followed by syncope then a passing out spell. Wife reported over the last several weeks he has been eating and drinking less, holding food in his mouth and remains drowsy.  On the day of admission he had a passing out spell and was less responsive than usual so EMS was called and brought to ED.   In the ER and blood pressure found to be 80/ 50, lactate 3.0 creatinine 2.1 from baseline potassium 2.6 he was given IV fluids and blood pressure improved with resolution of lactic acidosis.  Patient was admitted for dehydration.  Palliative care consulted to discuss goals of care.  On 8/23, family decided comfort care measures and transfer to residential hospice facility  Subjective: Patient was seen and examined this morning.  Comfortable.  Unable to arouse, breathing.  Family not at bedside.  Assessment/Plan: Progressive decline in physical and mental status followed by syncope -Comfort care status at this time. -Residential hospice placement in process -Continue as needed pain medication, sedatives. -Family members allowed to visit.  Issues addressed during this hospitalization Acute metabolic encephalopathy AKI on CKD stage IIIa Acute on chronic anemia   Parkinson's disease  Dementia Elevated troponin Hypokalemia Pyuria  Severe protein calorie malnutrition    Code Status:   Code Status: DNR   Nutritional status: Body mass index is 18.7 kg/m.     Diet: Comfort feeding Diet Order             Diet regular Room service appropriate? Yes with Assist; Fluid consistency: Thin  Diet effective now                   Family Communication: Family not at bedside today  Status is: Inpatient  Remains inpatient appropriate because: Pending hospice placement  Dispo: The patient is from: Home              Anticipated d/c is to: Residential hospice when bed available               Difficult to place patient No     Infusions:    Scheduled Meds:  busPIRone  10 mg Oral BID   carbidopa-levodopa  1 tablet Oral TID   mirtazapine  7.5 mg Oral QHS   QUEtiapine  25-50 mg Oral QHS   rivastigmine  1.5 mg Oral BID    Antimicrobials: Anti-infectives (From admission, onward)    None       PRN meds: acetaminophen **OR** acetaminophen, antiseptic oral rinse, glycopyrrolate **OR** glycopyrrolate **OR** glycopyrrolate, haloperidol **OR** haloperidol **OR** haloperidol lactate, morphine CONCENTRATE, polyvinyl alcohol   Objective: Vitals:   02/14/21 0530 02/14/21 1425  BP: (!) 161/87 (!) 157/80  Pulse: 83 79  Resp: 15 16  Temp: 97.9 F (36.6 C) 98.2 F (36.8 C)  SpO2: 96% 96%    Intake/Output Summary (Last 24 hours) at 02/14/2021 1513 Last data filed at 02/14/2021 0900 Gross per 24  hour  Intake 0 ml  Output 450 ml  Net -450 ml   Filed Weights   02/11/21 0444 02/12/21 0458  Weight: 52.7 kg 55.8 kg   Weight change:  Body mass index is 18.7 kg/m.   Physical Exam: General exam: Comfortable I did not do a detailed physical examination because of comfort care status.  Data Review: I have personally reviewed the laboratory data and studies available.  Recent Labs  Lab 02/09/21 1815 02/09/21 1832 02/10/21 0204 02/10/21 0726 02/11/21 0132  WBC 5.4  --  5.1  --  6.3  NEUTROABS 4.6  --  3.7  --   --   HGB 9.0* 8.5* 7.8* 6.5* 9.3*  HCT 27.7* 25.0* 22.8* 19.0*  27.3*  MCV 87.7  --  84.4  --  85.8  PLT 163  --  135*  --  133*   Recent Labs  Lab 02/09/21 1815 02/09/21 1832 02/10/21 0204 02/10/21 0726 02/11/21 0132 02/11/21 1637  NA 142 141 137 142 140  --   K 2.6* 2.5* 3.3* 3.0* 2.6* 3.8  CL 104 102 103  --  104  --   CO2 28  --  27  --  28  --   GLUCOSE 118* 110* 101*  --  99  --   BUN 33* 30* 34*  --  30*  --   CREATININE 2.10* 1.80* 1.95*  --  1.63*  --   CALCIUM 8.0*  --  7.6*  --  7.8*  --   MG 1.9  --  2.0  --   --   --   PHOS 2.8  --   --   --   --   --     F/u labs none Unresulted Labs (From admission, onward)    None       Signed, Lorin Glass, MD [facility] 02/14/2021

## 2021-02-14 NOTE — Progress Notes (Signed)
[facility] ([facility]) Hospital Liaison note.   ? ?Received request from TOC manager for family interest in [facility]. Spoke with family to confirm interest and answer questions. [facility] is unable to offer a room today. Hospital Liaison will follow up tomorrow or sooner if a room becomes available and eligibility is confirmed.  ? ?Please do not hesitate to call with questions.   ? ?Thank you,   ?Mary Anne Robertson, RN, CCM      ?[facility] Hospital Liaison   ?[phone] ?

## 2021-02-15 DIAGNOSIS — G9340 Encephalopathy, unspecified: Secondary | ICD-10-CM | POA: Diagnosis not present

## 2021-02-15 LAB — CULTURE, BLOOD (ROUTINE X 2)
Culture: NO GROWTH
Special Requests: ADEQUATE

## 2021-02-15 NOTE — Progress Notes (Signed)
Pt had a "good" day. Pt took afternoon and evening meds crushed in pudding without issue. He followed bites of pudding with water without issue.  He was talkative today. He responded to questions and thanked staff each time they helped him with something. He smiled several times today.  He is a/o x1.  Wife and daughter visited today.

## 2021-02-15 NOTE — Progress Notes (Signed)
Referral PROGRESS NOTE  [name]  DOB: [birth year]  PCP: [name], MD TGG:[id number]  DOA: 02/09/2021  LOS: 6 days  Hospital Day: 7   Chief complaint: Progressive decline in syncope   Brief narrative: [name] is a 80 y.o. male with PMH significant for Parkinson's disease, advanced dementia, now bedbound, lives at home, anemia of chronic disease, diastolic CHF, CKD stage III with baseline serum creatinine 1.5 and history of nephrolithiasis last year requiring a stent for sepsis. Patient presented to the ED on 8/19 with 2 weeks history of progressive decline in oral intake followed by syncope then a passing out spell. Wife reported over the last several weeks he has been eating and drinking less, holding food in his mouth and remains drowsy.  On the day of admission he had a passing out spell and was less responsive than usual so EMS was called and brought to ED.   In the ER and blood pressure found to be 80/ 50, lactate 3.0 creatinine 2.1 from baseline potassium 2.6 he was given IV fluids and blood pressure improved with resolution of lactic acidosis.  Patient was admitted for dehydration.  Palliative care consulted to discuss goals of care.  On 8/23, family decided comfort care measures and transfer to residential hospice facility  Subjective: Patient was seen and examined this morning.   Lying down in bed.  Opened eyes on touch with a smile.  Says 'thank you'.  No complaint.  Comfortable  Assessment/Plan: Progressive decline in physical and mental status followed by syncope -Family chose comfort care measures only. -Palliative care consultation appreciated. -Residential hospice placement in process -Continue as needed pain medication, sedatives. -Family members allowed to visit.  Issues addressed during this hospitalization Acute metabolic encephalopathy AKI on CKD stage IIIa Acute on chronic anemia   Parkinson's disease  Dementia Elevated  troponin Hypokalemia Pyuria  Severe protein calorie malnutrition    Code Status:   Code Status: DNR  Nutritional status: Body mass index is 18.7 kg/m.     Diet: Comfort feeding Diet Order             Diet regular Room service appropriate? Yes with Assist; Fluid consistency: Thin  Diet effective now                   Family Communication: Family not at bedside today  Status is: Inpatient  Remains inpatient appropriate because: Pending hospice placement  Dispo: The patient is from: Home              Anticipated d/c is to: Residential hospice when bed available               Difficult to place patient No     Infusions:    Scheduled Meds:  busPIRone  10 mg Oral BID   carbidopa-levodopa  1 tablet Oral TID   mirtazapine  7.5 mg Oral QHS   QUEtiapine  25-50 mg Oral QHS   rivastigmine  1.5 mg Oral BID    Antimicrobials: Anti-infectives (From admission, onward)    None       PRN meds: acetaminophen **OR** acetaminophen, antiseptic oral rinse, glycopyrrolate **OR** glycopyrrolate **OR** glycopyrrolate, haloperidol **OR** haloperidol **OR** haloperidol lactate, morphine CONCENTRATE, polyvinyl alcohol   Objective: Vitals:   02/14/21 1425 02/15/21 0429  BP: (!) 157/80 (!) 153/86  Pulse: 79 68  Resp: 16 16  Temp: 98.2 F (36.8 C) (!) 97.5 F (36.4 C)  SpO2: 96% 100%    Intake/Output  Summary (Last 24 hours) at 02/15/2021 0903 Last data filed at 02/15/2021 0432 Gross per 24 hour  Intake 120 ml  Output 300 ml  Net -180 ml    Filed Weights   02/11/21 0444 02/12/21 0458  Weight: 52.7 kg 55.8 kg   Weight change:  Body mass index is 18.7 kg/m.   Physical Exam: General exam: Comfortable I did not do a detailed physical examination because of comfort care status.  Data Review: I have personally reviewed the laboratory data and studies available.  Recent Labs  Lab 02/09/21 1815 02/09/21 1832 02/10/21 0204 02/10/21 0726 02/11/21 0132  WBC 5.4   --  5.1  --  6.3  NEUTROABS 4.6  --  3.7  --   --   HGB 9.0* 8.5* 7.8* 6.5* 9.3*  HCT 27.7* 25.0* 22.8* 19.0* 27.3*  MCV 87.7  --  84.4  --  85.8  PLT 163  --  135*  --  133*    Recent Labs  Lab 02/09/21 1815 02/09/21 1832 02/10/21 0204 02/10/21 0726 02/11/21 0132 02/11/21 1637  NA 142 141 137 142 140  --   K 2.6* 2.5* 3.3* 3.0* 2.6* 3.8  CL 104 102 103  --  104  --   CO2 28  --  27  --  28  --   GLUCOSE 118* 110* 101*  --  99  --   BUN 33* 30* 34*  --  30*  --   CREATININE 2.10* 1.80* 1.95*  --  1.63*  --   CALCIUM 8.0*  --  7.6*  --  7.8*  --   MG 1.9  --  2.0  --   --   --   PHOS 2.8  --   --   --   --   --      F/u labs none Unresulted Labs (From admission, onward)    None       Signed, Lorin Glass, MD [facility] 02/15/2021

## 2021-02-15 NOTE — Progress Notes (Signed)
Civil engineer, contracting [facility]) Hospital Liaison note.    Chart and pt information have been reviewed  by [facility] physician. [facility] eligibility confirmed.  Visited at bedside with pt and pt's spouse, [name].  Discussed care at [facility] and process for transfer.  Reviewed possible room and board fee at [facility], shared that this is sliding scale and that [facility] SW could assist with financial assessment to possibly lower daily rate; [name] shared doubt about this, asks what hospice services at home would look like.  Education offered.  Clide Cliff shares plan to discuss with their daughter. [facility] Liaisons will follow up tomorrow.  [facility] is unable to offer a room today. Hospital Liaison will follow up tomorrow or sooner if a room becomes available. Please do not hesitate to call with questions.    Thank you for the opportunity to participate in this patient's care.  Gillian Scarce, BSN, RN [facility] Liaison (listed on [place] under Hospice/[facility])    [phone] (24h on call)

## 2021-02-15 NOTE — Care Management Important Message (Signed)
Important Message  Patient Details  Name: [name] MRN: [id number] Date of Birth: 15-Sep-1940   Medicare Important Message Given:  Yes  Patient left prior to IM delivery IM mailed to the patient home address.   [name] 02/15/2021, 3:35 PM

## 2021-02-15 NOTE — Progress Notes (Addendum)
This chaplain responded to PMT referral for spiritual care.  The Pt. wife-[name] is at the bedside. The family decided to transition the Pt. to comfort care. The Pt. is waiting on a bed at [facility].  [name] introduced me to the Pt. through their 40+ years of marriage. The chaplain affirmed [name]'s stories of their relationship and recognized [name]'s eyes and smile as she reflected on the memories that may help her live with the loss.   The chaplain attempted to answer the [name]'s questions about Hospice care and give her the space to pause as the Pt. caregiver. The chaplain understands [name] is confident in God's love as her companion as she anticipates life without the Pt.    [name] accepted the chaplain's invitation for prayer and F/U spiritual care as needed.

## 2021-02-16 DIAGNOSIS — R4182 Altered mental status, unspecified: Secondary | ICD-10-CM | POA: Diagnosis not present

## 2021-02-16 DIAGNOSIS — G9340 Encephalopathy, unspecified: Secondary | ICD-10-CM | POA: Diagnosis not present

## 2021-02-16 DIAGNOSIS — N179 Acute kidney failure, unspecified: Secondary | ICD-10-CM | POA: Diagnosis not present

## 2021-02-16 DIAGNOSIS — I5032 Chronic diastolic (congestive) heart failure: Secondary | ICD-10-CM | POA: Diagnosis not present

## 2021-02-16 NOTE — Progress Notes (Signed)
Referral PROGRESS NOTE  [name]  DOB: [birth year]  PCP: [name], MD MWN:[id number]  DOA: 02/09/2021  LOS: 7 days  Hospital Day: 8   Chief complaint: Progressive decline in syncope   Brief narrative: [name] is a 80 y.o. male with PMH significant for Parkinson's disease, advanced dementia, now bedbound, lives at home, anemia of chronic disease, diastolic CHF, CKD stage III with baseline serum creatinine 1.5 and history of nephrolithiasis last year requiring a stent for sepsis. Patient presented to the ED on 8/19 with 2 weeks history of progressive decline in oral intake followed by syncope then a passing out spell. Wife reported over the last several weeks he has been eating and drinking less, holding food in his mouth and remains drowsy.  On the day of admission he had a passing out spell and was less responsive than usual so EMS was called and brought to ED.   In the ER and blood pressure found to be 80/ 50, lactate 3.0 creatinine 2.1 from baseline potassium 2.6 he was given IV fluids and blood pressure improved with resolution of lactic acidosis.  Patient was admitted for dehydration.  Palliative care consulted to discuss goals of care.  On 8/23, family decided comfort care measures and transfer to residential hospice facility  Subjective: Patient was seen and examined this morning.   Open eyes and verbal command.  Cheerful.  Per staff, he ate half his breakfast today.  Patient's wife and other family members were at bedside.  Wife still seems uncomfortable with the fact that 'he will not get any better'.  Other family members at bedside helping her understand. Pending inpatient hospice bed availability.  Assessment/Plan: Progressive decline in physical and mental status followed by syncope -Family chose comfort care measures only. -Palliative care consultation appreciated. -Residential hospice placement in process -Continue as needed pain medication,  sedatives. -Family members allowed to visit.  Issues addressed during this hospitalization Acute metabolic encephalopathy AKI on CKD stage IIIa Acute on chronic anemia   Parkinson's disease  Dementia Elevated troponin Hypokalemia Pyuria  Severe protein calorie malnutrition    Code Status:   Code Status: DNR  Nutritional status: Body mass index is 18.7 kg/m.     Diet: Comfort feeding Diet Order             Diet regular Room service appropriate? Yes with Assist; Fluid consistency: Thin  Diet effective now                   Family Communication: Family at bedside today  Status is: Inpatient  Remains inpatient appropriate because: Pending hospice placement  Dispo: The patient is from: Home              Anticipated d/c is to: Residential hospice when bed available               Difficult to place patient No     Infusions:    Scheduled Meds:  busPIRone  10 mg Oral BID   carbidopa-levodopa  1 tablet Oral TID   mirtazapine  7.5 mg Oral QHS   QUEtiapine  25-50 mg Oral QHS   rivastigmine  1.5 mg Oral BID    Antimicrobials: Anti-infectives (From admission, onward)    None       PRN meds: acetaminophen **OR** acetaminophen, antiseptic oral rinse, glycopyrrolate **OR** glycopyrrolate **OR** glycopyrrolate, haloperidol **OR** haloperidol **OR** haloperidol lactate, morphine CONCENTRATE, polyvinyl alcohol   Objective: Vitals:   02/15/21 0429 02/16/21 0351  BP: (!) 153/86 (!) 155/84  Pulse: 68 68  Resp: 16 16  Temp: (!) 97.5 F (36.4 C) 97.9 F (36.6 C)  SpO2: 100% 100%    Intake/Output Summary (Last 24 hours) at 02/16/2021 1452 Last data filed at 02/16/2021 0500 Gross per 24 hour  Intake --  Output 375 ml  Net -375 ml    Filed Weights   02/11/21 0444 02/12/21 0458  Weight: 52.7 kg 55.8 kg   Weight change:  Body mass index is 18.7 kg/m.   Physical Exam: General exam: Comfortable I did not do a detailed physical examination because of  comfort care status.  Data Review: I have personally reviewed the laboratory data and studies available.  Recent Labs  Lab 02/09/21 1815 02/09/21 1832 02/10/21 0204 02/10/21 0726 02/11/21 0132  WBC 5.4  --  5.1  --  6.3  NEUTROABS 4.6  --  3.7  --   --   HGB 9.0* 8.5* 7.8* 6.5* 9.3*  HCT 27.7* 25.0* 22.8* 19.0* 27.3*  MCV 87.7  --  84.4  --  85.8  PLT 163  --  135*  --  133*    Recent Labs  Lab 02/09/21 1815 02/09/21 1832 02/10/21 0204 02/10/21 0726 02/11/21 0132 02/11/21 1637  NA 142 141 137 142 140  --   K 2.6* 2.5* 3.3* 3.0* 2.6* 3.8  CL 104 102 103  --  104  --   CO2 28  --  27  --  28  --   GLUCOSE 118* 110* 101*  --  99  --   BUN 33* 30* 34*  --  30*  --   CREATININE 2.10* 1.80* 1.95*  --  1.63*  --   CALCIUM 8.0*  --  7.6*  --  7.8*  --   MG 1.9  --  2.0  --   --   --   PHOS 2.8  --   --   --   --   --      F/u labs none Unresulted Labs (From admission, onward)    None       Signed, Lorin Glass, MD [facility] 02/16/2021

## 2021-02-16 NOTE — Progress Notes (Signed)
Daily Progress Note   Patient Name: [name]       Date: 02/16/2021 DOB: 09/27/1940  Age: 80 y.o. MRN#: [id number] Attending Physician: Lorin Glassahal, Binaya, MD Primary Care Physician: Tresa GarterPlotnikov, Aleksei V, MD Admit Date: 02/09/2021  Reason for Consultation/Follow-up: Disposition, Establishing goals of care, Non pain symptom management, Pain control, Psychosocial/spiritual support, and Terminal Care  Subjective: [facility] Chrislyn Brooke DareKing, that she had spoken with patient's wife and they are now considering taking patient home with hospice. She provided family education on home hospice services as well as more detailed information on [facility] - I appreciate her assistance.  Chart review performed. Received report from primary RN - no acute concerns. RN reports patient's PO intake has improved and took medications today. RN also reports patient is confused and hallucinates at times.  Went to visit patient at bedside - wife/[name] present and assisting patient in eating dinner. Patient took spoonful of broth during my visit. Had eaten about 10% of tray. Patient was lying in bed - he does wake to voice/gentle touch but is lethargic and does not open his eyes. Patient is confused. No signs or non-verbal gestures of pain or discomfort noted. No respiratory distress, increased work of breathing, or secretions noted. Patient denies pain.  Offered ongoing support to wife. [name] expresses happiness that patient is more awake and eating more - validated that this is encouraging and often seen when patient's are transitioned to comfort measures, as patient's are overall are more comfortable and quality of life is better. Reviewed  conversation [name] had with Chrislyn today. [name] confirms they are considering taking patient home with hospice [facility]. Answered [name]'s questions about discharge process with home hospice, DME delivery, and expectations if they did decide on this option. [name] states she is going to work on moving furniture around in the home to ensure they can accommodate a hospital bed. After questions were answered, it seems family will likely pursue home hospice, but they have not officially decided yet. Offered to follow up with [name] tomorrow or she [facility] once a decision is made.   [name] expressed appreciation for visit today. All questions and concerns addressed. Encouraged to call with questions and/or concerns. PMT card  previously provided.   Length of Stay: 7  Current Medications: Scheduled Meds:  . busPIRone  10 mg Oral BID  . carbidopa-levodopa  1 tablet Oral TID  . mirtazapine  7.5 mg Oral QHS  . QUEtiapine  25-50 mg Oral QHS  . rivastigmine  1.5 mg Oral BID    Continuous Infusions:   PRN Meds: acetaminophen **OR** acetaminophen, antiseptic oral rinse, glycopyrrolate **OR** glycopyrrolate **OR** glycopyrrolate, haloperidol **OR** haloperidol **OR** haloperidol lactate, morphine CONCENTRATE, polyvinyl alcohol  Physical Exam Vitals and nursing note reviewed.  Constitutional:      General: He is not in acute distress.    Appearance: He is cachectic. He is ill-appearing.  Pulmonary:     Effort: No respiratory distress.  Skin:    General: Skin is cool and dry.  Neurological:     Mental Status: He is lethargic.     Motor: Weakness present.            Vital Signs: BP (!) 155/84 (BP Location: Left Arm)   Pulse 68   Temp 97.9 F (36.6 C) (Oral)   Resp 16   Wt 55.8 kg   SpO2 100%   BMI 18.70 kg/m  SpO2: SpO2: 100 % O2 Device: O2 Device: Room Air O2 Flow Rate:    Intake/output summary:  Intake/Output Summary (Last 24  hours) at 02/16/2021 1654 Last data filed at 02/16/2021 0500 Gross per 24 hour  Intake --  Output 375 ml  Net -375 ml   LBM: Last BM Date: 02/11/21 Baseline Weight: Weight: 52.7 kg Most recent weight: Weight: 55.8 kg       Palliative Assessment/Data: PPS 20%    Flowsheet Rows    Flowsheet Row Most Recent Value  Intake Tab   Referral Department Hospitalist  Unit at Time of Referral ER  Palliative Care Primary Diagnosis Neurology  Date Notified 02/10/21  Palliative Care Type Return patient Palliative Care  Reason for referral Clarify Goals of Care  Date of Admission 02/09/21  Date first seen by Palliative Care 02/10/21  # of days Palliative referral response time 0 Day(s)  # of days IP prior to Palliative referral 1  Clinical Assessment   Psychosocial & Spiritual Assessment   Palliative Care Outcomes   Patient/Family meeting held? Yes  Who was at the meeting? wife  Palliative Care Outcomes Clarified goals of care, Counseled regarding hospice, Provided psychosocial or spiritual support  [in person meeting scheduled for tomorrow]  Patient/Family wishes: Interventions discontinued/not started  Mechanical Ventilation, BiPAP, Tube feedings/TPN, NIPPV, Hemodialysis, Trach, PEG, Transfer out of ICU, Vasopressors       Patient Active Problem List   Diagnosis Date Noted  . Palliative care by specialist   . Goals of care, counseling/discussion   . DNR (do not resuscitate)   . DNI (do not intubate)   . Encounter for hospice care discussion   . Concern about end of life   . Decreased oral intake   . AKI (acute kidney injury) (HCC) 02/10/2021  . Elevated troponin 02/10/2021  . Hypotension 02/10/2021  . Lactic acidosis 02/10/2021  . Chronic diastolic CHF (congestive heart failure) (HCC) 02/10/2021  . Atherosclerosis of aorta (HCC) 10/17/2020  . History of CVA (cerebrovascular accident) 05/11/2020  . Acute encephalopathy 05/10/2020  . AMS (altered mental status) 05/04/2020  .  Syncope 05/03/2020  . Neutropenia (HCC) 05/03/2020  . Thrombocytopenia (HCC) 05/03/2020  . COVID-19 virus infection 05/03/2020  . Shingles 11/17/2019  . Hydronephrosis with renal and ureteral calculus obstruction 07/06/2019  .  Constipation 07/03/2019  . Acute metabolic encephalopathy 07/03/2019  . CRF (chronic renal failure), stage 3 (moderate) 03/18/2019  . Pressure injury of skin 02/14/2019  . ARF (acute renal failure) (HCC) 02/13/2019  . Sepsis (HCC) 02/13/2019  . Protein-calorie malnutrition, severe (HCC) 02/13/2019  . 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 Parkinson's syndrome (HCC) 02/13/2019  . Infected sebaceous cyst 12/04/2018  . Hallucinations 11/19/2018  . Shoulder pain 11/19/2018  . Vitamin B12 deficiency 11/19/2018  . 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 Memory loss 09/21/2018  . Falls frequently 09/21/2018  . Fatigue 10/27/2017  . Edema 07/29/2017  . Chronic venous insufficiency 06/30/2017  . Cellulitis and abscess of leg, except foot 06/30/2017  . Ear pain, referred, right 06/10/2016  . Urinary frequency 04/16/2016  . Cerumen impaction 08/22/2015  . Infection of right hand due to bite 04/20/2014  . Hemifacial spasm 09/15/2013  . Well adult exam 07/16/2013  . Dermatochalasis of eyelid 01/01/2012  . Other facial nerve disorders 01/01/2012  . 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 Twitching 11/04/2011  . Non-REM Parasomnia 10/14/2011  . Dog bite of left hand 08/19/2011  . Wound infection 08/19/2011  . Hypokalemia 12/10/2010  . Hyperglycemia 12/10/2010  . Dyslipidemia   . Benign prostatic hyperplasia   . LEG CRAMPS 03/07/2009  . OTITIS EXTERNA 08/30/2008  . Prostatitis 08/30/2008  . Essential hypertension 08/26/2007  . ERECTILE DYSFUNCTION 08/26/2007    Palliative Care Assessment & Plan   Patient Profile: 80 y.o. male  with past medical history of chronic diastolic heart failure, chronic anemia,  hyperlipidemia, hypertension, BPH, advanced Parkinson's disease with dementia, stage IIIa chronic kidney disease presented to the ED on 02/09/21 from [facility] due to an unresponsive episode. The patient's wife reports that the patient has exhibited progressive increased somnolence, diminished responsiveness, with minimal PO intake over the last week. Patient was found to be hypotensive in ED. Patient was admitted on 02/09/2021 with acute encephalopathy, AKI, elevated troponin, hypotension, hypokalemia, lactic acidosis.    Of note, patient was previously seen by PMT in December 2021 and had a recent outpatient Palliative Care visit about 10 days ago with [facility].   Assessment: Acute metabolic encephalopathy AKI on CKD stage 3a Acute on chronic anemia Parkinson's disease with dementia Severe protein calorie malnutrition  Recommendations/Plan: Continue full comfort measures Continue DNR/DNI as previously documented Family are now considering discharging patient home with hospice - will have decision tomorrow 8/26 after wife verifies hospital bed will fit in the home Continue current comfort focused medication regimen - patient appears comfortable PMT will continue to follow and support holistically  Goals of Care and Additional Recommendations: Limitations on Scope of Treatment: Full Comfort Care  Code Status:    Code Status Orders  (From admission, onward)           Start     Ordered   02/13/21 1957  Do not attempt resuscitation (DNR)  Continuous       Question Answer Comment  In the event of cardiac or respiratory ARREST Do not call a "code blue"   In the event of cardiac or respiratory ARREST Do not perform Intubation, CPR, defibrillation or ACLS   In the event of cardiac or respiratory ARREST Use medication by any route, position, wound care, and other measures to relive pain and suffering. May use oxygen, suction and manual treatment of airway obstruction as needed for  comfort.      02/13/21 1958  Code Status History     Date Active Date Inactive Code Status Order ID Comments User Context   02/09/2021 2143 02/13/2021 1958 DNR [id number]  Angie Fava, DO ED   02/09/2021 2029 02/09/2021 2142 Full Code [id number]  Angie Fava, DO ED   05/25/2020 1352 06/01/2020 0053 DNR [id number]  Merry Proud, NP Inpatient   05/11/2020 0127 05/25/2020 1352 Full Code [id number]  Synetta Fail, MD Inpatient   05/10/2020 2320 05/11/2020 0127 DNR [id number]  Synetta Fail, MD ED   05/04/2020 0005 05/09/2020 1815 DNR [id number]  Albertine Grates, MD Inpatient   07/04/2019 0754 07/06/2019 2210 DNR [id number]  Therisa Doyne, MD Inpatient   02/13/2019 1644 02/24/2019 1619 Full Code [id number]  Pearson Grippe, MD ED   04/20/2014 1315 04/24/2014 1440 Full Code [id number]  Dominica Severin, MD Inpatient       Prognosis:  < 4 weeks pending patient's PO intake  Discharge Planning: Likely Home with Hospice  Care plan was discussed with primary RN, [facility] liaison, patient's wife  Thank you for allowing the Palliative Medicine Team to assist in the care of this patient.   Total Time 37 minutes Prolonged Time Billed  no       Greater than 50%  of this time was spent counseling and coordinating care related to the above assessment and plan.  Haskel Khan, NP  Please contact Palliative Medicine Team phone at [phone] for questions and concerns.

## 2021-02-16 NOTE — Progress Notes (Addendum)
Pt awake and alert to self. Ate a little less than half of breakfast with assistance. Took meds crushed in chocolate pudding without issue.  Wife and family bedside.

## 2021-02-16 NOTE — Progress Notes (Signed)
Manufacturing engineer [facility]) Hospital Liaison note.    Met at bedside with pt and family, then privately with pt's wife, [name], with their daughter on speakerphone.  Discussed pt having a couple of "good days" back to back, reports pt ate all of oatmeal and took meds easily this morning.  [name] shares this is how he was at home before hospitalization.  More education offered on hospice services in the home.  [name] shares more concerns about possibility of room and board charge at [facility]; daughter acknowledges pt had asked to stay home if possible.  Discussed that with oral intake greater than a bite or two then pt would not be inpatient hospice appropriate, or might have a lengthy stay.  [name] clear that this is not desired.  Plan made for [name] to discuss privately with daughter and reach out to [facility] liaisons this afternoon.  Thank you for the opportunity to participate in this patient's care.  Domenic Moras, BSN, RN [facility] Liaison (listed on [address] under Hospice/[facility])    [phone] (24h on call)

## 2021-02-17 DIAGNOSIS — G9340 Encephalopathy, unspecified: Secondary | ICD-10-CM | POA: Diagnosis not present

## 2021-02-17 NOTE — Plan of Care (Signed)
  Problem: Clinical Measurements: Goal: Quality of life will improve Outcome: Not Progressing   Problem: Respiratory: Goal: Verbalizations of increased ease of respirations will increase Outcome: Progressing

## 2021-02-17 NOTE — Progress Notes (Addendum)
Civil engineer, contracting [facility])  Discussed with Mrs. Sylve residential hospice vs. Home with hospice. The family have elected to take Mr. Bollman home with hospice.  DME discussed, hospital bed will need to be in place. This likely will be delivered Sunday am, and he can discharge after this is in place. [facility] will order this bed.  Please arrange for any comfort scripts that may be needed so there is no lapse in his comfort prior to hospice services being started in the home (hope to start same day but could be the day after dc).  Please send completed DNR.  Thank you, Wallis Bamberg RN, BSN, CCRN [facility] Liaison   **spoke with DME vendor, they are unable to deliver the bed today but will on Sunday.

## 2021-02-17 NOTE — Progress Notes (Signed)
Referral PROGRESS NOTE  [name]  DOB: 25-Apr-1941  PCP: [name], MD JJK:[id number]  DOA: 02/09/2021  LOS: 8 days  Hospital Day: 9   Chief complaint: Progressive decline in syncope   Brief narrative: [name] is a 80 y.o. male with PMH significant for Parkinson's disease, advanced dementia, now bedbound, lives at home, anemia of chronic disease, diastolic CHF, CKD stage III with baseline serum creatinine 1.5 and history of nephrolithiasis last year requiring a stent for sepsis. Patient presented to the ED on 8/19 with 2 weeks history of progressive decline in oral intake followed by syncope then a passing out spell. Wife reported over the last several weeks he has been eating and drinking less, holding food in his mouth and remains drowsy.  On the day of admission he had a passing out spell and was less responsive than usual so EMS was called and brought to ED.   In the ER and blood pressure found to be 80/ 50, lactate 3.0 creatinine 2.1 from baseline potassium 2.6 he was given IV fluids and blood pressure improved with resolution of lactic acidosis.  Patient was admitted for dehydration.  Palliative care consulted to discuss goals of care.  On 8/23, family decided comfort care measures and transfer to residential hospice facility  Subjective: Patient was seen and examined this morning.   Sleeping.  Comfortable.  Daughter at bedside who is running the breakfast tray. Talk to patient's wife on the phone.   Family has decided to take him home with hospice services.   Waiting for hospital bed availability  Assessment/Plan: Progressive decline in physical and mental status followed by syncope -Family chose comfort care measures only. -Palliative care consultation appreciated. -Residential hospice placement was tried but no bed available.  Family made a decision to take him home with hospice.  Pending DME arrangements. -Continue as needed pain medication,  sedatives. -Family members allowed to visit.  Issues addressed during this hospitalization Acute metabolic encephalopathy AKI on CKD stage IIIa Acute on chronic anemia   Parkinson's disease  Dementia Elevated troponin Hypokalemia Pyuria  Severe protein calorie malnutrition    Code Status:   Code Status: DNR  Nutritional status: Body mass index is 18.7 kg/m.     Diet: Comfort feeding Diet Order             Diet regular Room service appropriate? Yes with Assist; Fluid consistency: Thin  Diet effective now                   Family Communication: Family at bedside today  Status is: Inpatient  Remains inpatient appropriate because: Pending hospice placement  Dispo: The patient is from: Home              Anticipated d/c is to: Residential hospice when bed available               Difficult to place patient No     Infusions:    Scheduled Meds:  busPIRone  10 mg Oral BID   carbidopa-levodopa  1 tablet Oral TID   mirtazapine  7.5 mg Oral QHS   QUEtiapine  25-50 mg Oral QHS   rivastigmine  1.5 mg Oral BID    Antimicrobials: Anti-infectives (From admission, onward)    None       PRN meds: acetaminophen **OR** acetaminophen, antiseptic oral rinse, glycopyrrolate **OR** glycopyrrolate **OR** glycopyrrolate, haloperidol **OR** haloperidol **OR** haloperidol lactate, morphine CONCENTRATE, polyvinyl alcohol   Objective: Vitals:   02/16/21  0351 02/17/21 0450  BP: (!) 155/84 (!) 146/85  Pulse: 68 64  Resp: 16 17  Temp: 97.9 F (36.6 C) (!) 97.5 F (36.4 C)  SpO2: 100% 98%   No intake or output data in the 24 hours ending 02/17/21 1021  Filed Weights   02/11/21 0444 02/12/21 0458  Weight: 52.7 kg 55.8 kg   Weight change:  Body mass index is 18.7 kg/m.   Physical Exam: General exam: Comfortable I did not do a detailed physical examination because of comfort care status.  Data Review: I have personally reviewed the laboratory data and studies  available.  Recent Labs  Lab 02/11/21 0132  WBC 6.3  HGB 9.3*  HCT 27.3*  MCV 85.8  PLT 133*    Recent Labs  Lab 02/11/21 0132 02/11/21 1637  NA 140  --   K 2.6* 3.8  CL 104  --   CO2 28  --   GLUCOSE 99  --   BUN 30*  --   CREATININE 1.63*  --   CALCIUM 7.8*  --      F/u labs none Unresulted Labs (From admission, onward)    None       Signed, Lorin Glass, MD [facility] 02/17/2021

## 2021-02-18 ENCOUNTER — Emergency Department (HOSPITAL_COMMUNITY)
Admission: EM | Admit: 2021-02-18 | Discharge: 2021-02-19 | Disposition: A | Discharge status: Home / Self Care | Payer: Medicare Other | Attending: Emergency Medicine | Admitting: Emergency Medicine

## 2021-02-18 ENCOUNTER — Other Ambulatory Visit: Payer: Self-pay

## 2021-02-18 DIAGNOSIS — Z87891 Personal history of nicotine dependence: Secondary | ICD-10-CM | POA: Diagnosis not present

## 2021-02-18 DIAGNOSIS — G20 Parkinson's disease: Secondary | ICD-10-CM | POA: Diagnosis not present

## 2021-02-18 DIAGNOSIS — R569 Unspecified convulsions: Secondary | ICD-10-CM | POA: Insufficient documentation

## 2021-02-18 DIAGNOSIS — Z515 Encounter for palliative care: Secondary | ICD-10-CM

## 2021-02-18 DIAGNOSIS — I5032 Chronic diastolic (congestive) heart failure: Secondary | ICD-10-CM | POA: Insufficient documentation

## 2021-02-18 DIAGNOSIS — I110 Hypertensive heart disease with heart failure: Secondary | ICD-10-CM | POA: Diagnosis not present

## 2021-02-18 DIAGNOSIS — G9340 Encephalopathy, unspecified: Secondary | ICD-10-CM | POA: Diagnosis not present

## 2021-02-18 MED ORDER — MORPHINE SULFATE (CONCENTRATE) 10 MG/0.5ML PO SOLN: 5.0000 mg | ORAL | 0 refills | Status: AC | PRN

## 2021-02-18 MED ORDER — TRAZODONE HCL 50 MG PO TABS
100.0000 mg | ORAL_TABLET | Freq: Every day | ORAL | Status: DC
  Administered 2021-02-18: 100 mg via ORAL
  Filled 2021-02-18: qty 2

## 2021-02-18 MED ORDER — HALOPERIDOL LACTATE 2 MG/ML PO CONC: 1.0000 mg | ORAL | Status: DC | PRN

## 2021-02-18 MED ORDER — CARBIDOPA-LEVODOPA 25-100 MG PO TABS
1.0000 | ORAL_TABLET | Freq: Three times a day (TID) | ORAL | Status: DC
  Administered 2021-02-18 – 2021-02-19 (×2): 1 via ORAL
  Filled 2021-02-18 (×2): qty 1

## 2021-02-18 MED ORDER — BUSPIRONE HCL 10 MG PO TABS
10.0000 mg | ORAL_TABLET | Freq: Two times a day (BID) | ORAL | Status: DC
  Administered 2021-02-18 – 2021-02-19 (×2): 10 mg via ORAL
  Filled 2021-02-18 (×2): qty 1

## 2021-02-18 MED ORDER — QUETIAPINE FUMARATE 50 MG PO TABS
50.0000 mg | ORAL_TABLET | Freq: Every day | ORAL | Status: DC
  Administered 2021-02-18: 50 mg via ORAL
  Filled 2021-02-18 (×2): qty 1

## 2021-02-18 MED ORDER — HALOPERIDOL LACTATE 2 MG/ML PO CONC: 1.0000 mg | ORAL | 0 refills | Status: AC | PRN

## 2021-02-18 MED ORDER — GLYCOPYRROLATE 1 MG PO TABS: 1.0000 mg | ORAL_TABLET | ORAL | 0 refills | Status: AC | PRN

## 2021-02-18 MED ORDER — RIVASTIGMINE TARTRATE 1.5 MG PO CAPS
1.5000 mg | ORAL_CAPSULE | Freq: Two times a day (BID) | ORAL | Status: DC
  Administered 2021-02-18 – 2021-02-19 (×2): 1.5 mg via ORAL
  Filled 2021-02-18 (×3): qty 1

## 2021-02-18 MED ORDER — MIRTAZAPINE 7.5 MG PO TABS: 7.5000 mg | ORAL_TABLET | Freq: Every day | ORAL | 0 refills | Status: AC

## 2021-02-18 MED ORDER — QUETIAPINE FUMARATE 25 MG PO TABS: 25.0000 mg | ORAL_TABLET | Freq: Every day | ORAL | Status: DC

## 2021-02-18 MED ORDER — NAYZILAM 5 MG/0.1ML NA SOLN: 1.0000 | Freq: Every day | NASAL | 3 refills | Status: AC | PRN

## 2021-02-18 MED ORDER — MIRTAZAPINE 7.5 MG PO TABS: 7.5000 mg | ORAL_TABLET | Freq: Every day | ORAL | Status: DC

## 2021-02-18 MED ORDER — GLYCOPYRROLATE 1 MG PO TABS: 1.0000 mg | ORAL_TABLET | ORAL | Status: DC | PRN

## 2021-02-18 NOTE — ED Triage Notes (Signed)
Pt bib gems d/t a witnessed seizure. Per ems, pt got discharged from [facility] today, and on the way home pt had a witnessed seizure for abt 20 seconds.  Hx of dementia and parkinson. Pt is DNR.   Vitals with ems : BP 132/86 Cbg 128  99% RA

## 2021-02-18 NOTE — TOC Transition Note (Signed)
Transition of Care [facility]) - CM/SW Discharge Note   Patient Details  Name: [name] MRN: [id number] Date of Birth: 10/04/1940  Transition of Care [facility]) CM/SW Contact:  Bess Kinds, RN Phone Number: [phone] 02/18/2021, 11:21 AM   Clinical Narrative:     Spoke with patient's spouse, [name], on the home phone number listed in demographics. [name] stated that DME had been delivered and was being set up. Confirmed need for ambulance transport. Confirmed home address for transport. Reviewed comfort medications sent to pharmacy, [name] will pick them up. PTAR arranged - advised that patient is 2nd on the list. Nursing notified. Confirmed signed DNR on chart. Medical transport paperwork on chart. Notified [name] of potential time frame for transportation pick up. Advised that nursing will contact her when [facility] arrives. Notified liaison at [employer], hospice visit planned for tomorrow. Wife aware. No further TOC needs identified at this time.   Final next level of care: Home w Hospice Care Barriers to Discharge: No Barriers Identified   Patient Goals and CMS Choice Patient states their goals for this hospitalization and ongoing recovery are:: return home with hospice care CMS Medicare.gov Compare Post Acute Care list provided to:: Patient Represenative (must comment) Choice offered to / list presented to : Spouse  Discharge Placement                       Discharge Plan and Services                DME Arranged: N/A DME Agency: NA       HH Arranged: NA HH Agency: NA        Social Determinants of Health (SDOH) Interventions     Readmission Risk Interventions No flowsheet data found.

## 2021-02-18 NOTE — Progress Notes (Signed)
Patient discharged to home with instructions given to transporter [facility], wife was also notified that patient was being transported with his discharge instructions.

## 2021-02-18 NOTE — ED Notes (Signed)
[name], [phone], daughter

## 2021-02-18 NOTE — ED Provider Notes (Signed)
[facility] EMERGENCY DEPARTMENT Provider Note   CSN: [id number] Arrival date & time: 02/18/21  1448     History No chief complaint on file.   [name] is a 80 y.o. male w/ a history of Parkinson's dementia, advanced dementia, bedbound, anemia of chronic disease, diastolic heart failure, presenting to emergency department with concern for shaking episode and possible seizure.  The patient just been discharged hours earlier, and was being transported back to his home, when the EMS services report he had 1/22 episode of generalized shaking.  He had been speaking to them prior to that.  He seemed confused afterwards.  After arrival in the ED the patient appears to be waking up, but is somewhat somnolent.  He has no prior history of seizures.  Per medical record review from the patient's last discharge, he had been admitted to the hospital with failure to thrive, hypotension, hypokalemia.  The patient was DNR/DNI, family decision to take him home for home hospice, as they were not able to find inpatient placement.  I spoke to the patient's wife by phone.  She reports that they did receive all of the hospice equipment including his bed today.  However they were not aware that he was going to be coming home today until the ambulance was in route to the house.  They have not had a home nurse arrived yet to give him training for hospice. (I spoke to Thea Gist from [employer] who confirms that home nurse was scheduled to go to the house tomorrow, Monday, for family training.)  Patient's wife confirms that he has never had a seizure before.  She expresses concern that she does not have any training for managing his symptoms at home.  She does not want any aggressive medical interventions performed in the hospital.    On prior hospitalization admission on February 10, 2019 to get a CT scan of the head which showed no acute findings or evidence of intracranial mass.  HPI      Past Medical History:  Diagnosis Date   B12 deficiency 11/19/2018   Benign prostatic hypertrophy    Edema    Hyperlipidemia    Hypertension    Insomnia    Parasomnia 10/14/2011   PSG 12/15/11>>AHI 0.4, SpO2 low 89%, PLMI 16, somnoliloquay, nocturia    Parkinson disease (HCC)    with associated dementia   Prediabetes 06/10/2012   Seasonal allergies    Sepsis (HCC) 01/2019    Patient Active Problem List   Diagnosis Date Noted   Palliative care by specialist    Goals of care, counseling/discussion    DNR (do not resuscitate)    DNI (do not intubate)    Encounter for hospice care discussion    Concern about end of life    Decreased oral intake    AKI (acute kidney injury) (HCC) 02/10/2021   Elevated troponin 02/10/2021   Hypotension 02/10/2021   Lactic acidosis 02/10/2021   Chronic diastolic CHF (congestive heart failure) (HCC) 02/10/2021   Atherosclerosis of aorta (HCC) 10/17/2020   History of CVA (cerebrovascular accident) 05/11/2020   Acute encephalopathy 05/10/2020   AMS (altered mental status) 05/04/2020   Syncope 05/03/2020   Neutropenia (HCC) 05/03/2020   Thrombocytopenia (HCC) 05/03/2020   COVID-19 virus infection 05/03/2020   Shingles 11/17/2019   Hydronephrosis with renal and ureteral calculus obstruction 07/06/2019   Constipation 07/03/2019   Acute metabolic encephalopathy 07/03/2019   CRF (chronic renal failure), stage 3 (moderate) 03/18/2019  Pressure injury of skin 02/14/2019   ARF (acute renal failure) (HCC) 02/13/2019   Sepsis (HCC) 02/13/2019   Protein-calorie malnutrition, severe (HCC) 02/13/2019   Dementia due to Parkinson's disease without behavioral disturbance (HCC) 02/13/2019   Parkinson's syndrome (HCC) 02/13/2019   Infected sebaceous cyst 12/04/2018   Hallucinations 11/19/2018   Shoulder pain 11/19/2018   Vitamin B12 deficiency 11/19/2018   Glaucoma suspect with open angle 11/03/2018   Memory loss 09/21/2018   Falls frequently  09/21/2018   Fatigue 10/27/2017   Edema 07/29/2017   Chronic venous insufficiency 06/30/2017   Cellulitis and abscess of leg, except foot 06/30/2017   Ear pain, referred, right 06/10/2016   Urinary frequency 04/16/2016   Cerumen impaction 08/22/2015   Infection of right hand due to bite 04/20/2014   Hemifacial spasm 09/15/2013   Well adult exam 07/16/2013   Dermatochalasis of eyelid 01/01/2012   Other facial nerve disorders 01/01/2012   Myogenic ptosis 11/04/2011   Twitching 11/04/2011   Non-REM Parasomnia 10/14/2011   Dog bite of left hand 08/19/2011   Wound infection 08/19/2011   Hypokalemia 12/10/2010   Hyperglycemia 12/10/2010   Dyslipidemia    Benign prostatic hyperplasia    LEG CRAMPS 03/07/2009   OTITIS EXTERNA 08/30/2008   Prostatitis 08/30/2008   Essential hypertension 08/26/2007   ERECTILE DYSFUNCTION 08/26/2007    Past Surgical History:  Procedure Laterality Date   CYSTOSCOPY/URETEROSCOPY/HOLMIUM LASER/STENT PLACEMENT Left 07/05/2019   Procedure: CYSTOSCOPY/URETEROSCOPY/STENT PLACEMENT/REROGRADE;  Surgeon: Crista Elliot, MD;  Location: [facility];  Service: Urology;  Laterality: Left;   FINGER SURGERY Right 04/19/2014   RIGHT INDEX & RING FINGER   FROM DOG BITE    I & D EXTREMITY Right 04/20/2014   Procedure: IRRIGATION AND DEBRIDEMENT right index finger and right ring finger;  Surgeon: Dominica Severin, MD;  Location: [facility] OR;  Service: Orthopedics;  Laterality: Right;   I & D EXTREMITY Right 04/21/2014   Procedure: IRRIGATION AND DEBRIDEMENT RIGHT HAND RING FINGER, INDEX FINGER AND Flexor Tenolysis and FDS Tenotomy;  Surgeon: Dominica Severin, MD;  Location: [facility] OR;  Service: Orthopedics;  Laterality: Right;       Family History  Problem Relation Age of Onset   Diabetes Mother    Tremor Father    Diabetes Other     Social History   Tobacco Use   Smoking status: Former    Packs/day: 1.00    Years: 7.00    Pack years: 7.00    Types: Cigarettes, Cigars    Smokeless tobacco: Never   Tobacco comments:    cigars day/ 1-2       QIUIT SMOKING IN 2000  Vaping Use   Vaping Use: Never used  Substance Use Topics   Alcohol use: Not Currently    Comment: OCCASSIONAL WINE -1 glass/month   Drug use: No    Home Medications Prior to Admission medications   Medication Sig Start Date End Date Taking? Authorizing Provider  busPIRone (BUSPAR) 10 MG tablet TAKE 1 TABLET BY MOUTH TWICE A DAY 02/02/21   Plotnikov, Georgina Quint, MD  carbidopa-levodopa (SINEMET IR) 25-100 MG tablet TAKE 1 TABLET BY MOUTH THREE TIMES A DAY WITH MEALS 12/11/20   Van Clines, MD  glycopyrrolate (ROBINUL) 1 MG tablet Take 1 tablet (1 mg total) by mouth every 4 (four) hours as needed for up to 5 days (excessive secretions). 02/18/21 02/23/21  Lorin Glass, MD  haloperidol (HALDOL) 2 MG/ML solution Place 0.5 mLs (1 mg total) under the tongue every  4 (four) hours as needed for up to 3 days for agitation (or delirium). 02/18/21 02/21/21  Lorin Glass, MD  mirtazapine (REMERON) 7.5 MG tablet Take 1 tablet (7.5 mg total) by mouth at bedtime for 7 days. 02/18/21 02/25/21  Lorin Glass, MD  Morphine Sulfate (MORPHINE CONCENTRATE) 10 MG/0.5ML SOLN concentrated solution Take 0.25 mLs (5 mg total) by mouth every 3 (three) hours as needed for up to 7 days for severe pain, moderate pain or shortness of breath. 02/18/21 02/25/21  Dahal, Melina Schools, MD  QUEtiapine (SEROQUEL) 50 MG tablet TAKE 0.5-1 TABLETS (25-50 MG TOTAL) BY MOUTH AT BEDTIME. 10/23/20   Plotnikov, Georgina Quint, MD  rivastigmine (EXELON) 1.5 MG capsule TAKE 1 CAPSULE (1.5 MG TOTAL) BY MOUTH 2 (TWO) TIMES DAILY. 12/11/20   Plotnikov, Georgina Quint, MD  SYRINGE-NEEDLE, DISP, 3 ML (BD ECLIPSE SYRINGE) 25G X 1" 3 ML MISC Use sq as directed 11/19/18   Plotnikov, Georgina Quint, MD  traZODone (DESYREL) 50 MG tablet TAKE 2 TABLETS BY MOUTH AT BEDTIME 12/11/20   Plotnikov, Georgina Quint, MD    Allergies    Amlodipine, Lorazepam, Penicillins, Sulfonamide derivatives,  Aspirin, Atorvastatin, Sulfa antibiotics, and Telmisartan  Review of Systems   Review of Systems  Constitutional:  Negative for chills and fever.  HENT:  Negative for ear pain and sore throat.   Eyes:  Negative for pain and visual disturbance.  Respiratory:  Negative for cough and shortness of breath.   Cardiovascular:  Negative for chest pain and palpitations.  Gastrointestinal:  Negative for abdominal pain and vomiting.  Musculoskeletal:  Negative for arthralgias and back pain.  Skin:  Negative for color change and rash.  Neurological:  Negative for seizures and syncope.  All other systems reviewed and are negative.  Physical Exam Updated Vital Signs There were no vitals taken for this visit.  Physical Exam Constitutional:      General: He is not in acute distress.    Comments: Awake, drowsy  HENT:     Head: Normocephalic and atraumatic.  Eyes:     Conjunctiva/sclera: Conjunctivae normal.     Pupils: Pupils are equal, round, and reactive to light.  Cardiovascular:     Rate and Rhythm: Normal rate and regular rhythm.  Pulmonary:     Effort: Pulmonary effort is normal. No respiratory distress.  Abdominal:     General: There is no distension.     Tenderness: There is no abdominal tenderness.  Skin:    General: Skin is warm and dry.  Neurological:     General: No focal deficit present.     Mental Status: He is alert. Mental status is at baseline.  Psychiatric:        Behavior: Behavior normal.    ED Results / Procedures / Treatments   Labs (all labs ordered are listed, but only abnormal results are displayed) Labs Reviewed - No data to display  EKG None  Radiology No results found.  Procedures Procedures   Medications Ordered in ED Medications - No data to display  ED Course  I have reviewed the triage vital signs and the nursing notes.  Pertinent labs & imaging results that were available during my care of the patient were reviewed by me and considered  in my medical decision making (see chart for details).  Patient presenting by EMS after suspected new onset seizure in route home for home hospice.  He is in end-of-life care.  I spoken to [facility] as noted above who is attempting  to clarify the timing and arrival of the home hospice nurse tomorrow, he will be able to provide family instructional training for how to manage his symptoms.  We can provide intranasal Versed if he continues to have seizing episodes at home.  The seizures may be multifactorial and possibly related to electrolyte deficiencies, as he had known hypokalemia, or other ongoing issues with malnutrition or medication side effects.  His vital signs are stable on arrival.  He is drowsy but awake and verbal.  *  Patient will be boarded in the ED overnight until discharge tomorrow with PT or home.  We will try to coordinate this with the arrival of the home health nurse who will be educating his wife, who is his only caregiver at home, how to manage his symptoms at home.  I informed his wife that the patient may clinically worsen and passed away while in the emergency department, and confirmed that we would not be performing any form of aggressive medical work-up in the ED.  Clinical Course as of 02/18/21 1629  Sun Feb 18, 2021  1539 I confirmed with social worker that home hospice nursing would be available tomorrow evening at 630 pm.  I called back and discussed this with the patient's wife.  They would prefer that the patient be sent home tomorrow instead so they will receive home training then.  We will board him in the ED overnight.  I did confirm with her that we would not be doing any aggressive medical interventions or work-up here. [MT]    Clinical Course User Index [MT] Cody Albus, Kermit BaloMatthew J, MD    Final Clinical Impression(s) / ED Diagnoses Final diagnoses:  None    Rx / DC Orders ED Discharge Orders     None        Terald Sleeperrifan, Elesha Thedford J, MD 02/18/21  1630

## 2021-02-18 NOTE — Discharge Summary (Signed)
Physician Discharge Summary  OPIE MACLAUGHLIN WUJ:[id number] DOB: [birth year] DOA: 02/09/2021  PCP: [name], MD  Admit date: 02/09/2021 Discharge date: 02/18/2021  Admitted From: Home Discharge disposition: Home with home hospice   Code Status: DNR   Discharge Diagnosis:   Principal Problem:   Acute encephalopathy Active Problems:   Dyslipidemia   Hypokalemia   Parkinson's syndrome (HCC)   AKI (acute kidney injury) (HCC)   Elevated troponin   Hypotension   Lactic acidosis   Chronic diastolic CHF (congestive heart failure) (HCC)   Palliative care by specialist   Goals of care, counseling/discussion   DNR (do not resuscitate)   DNI (do not intubate)   Encounter for hospice care discussion   Concern about end of life   Decreased oral intake   Chief complaint: Progressive decline in syncope   Brief narrative: EVERADO PILLSBURY is a 80 y.o. male with PMH significant for Parkinson's disease, advanced dementia, now bedbound, lives at home, anemia of chronic disease, diastolic CHF, CKD stage III with baseline serum creatinine 1.5 and history of nephrolithiasis last year requiring a stent for sepsis. Patient presented to the ED on 8/19 with 2 weeks history of progressive decline in oral intake followed by syncope then a passing out spell. Wife reported over the last several weeks he has been eating and drinking less, holding food in his mouth and remains drowsy.  On the day of admission he had a passing out spell and was less responsive than usual so EMS was called and brought to ED.   In the ER and blood pressure found to be 80/ 50, lactate 3.0 creatinine 2.1 from baseline potassium 2.6 he was given IV fluids and blood pressure improved with resolution of lactic acidosis.  Patient was admitted for dehydration.  Palliative care consulted to discuss goals of care.  On 8/23, family decided comfort care measures and transfer to residential hospice facility  Subjective: Patient  was seen and examined this morning.   Sleeping.  Opens eyes on verbal command.  Not in distress.  No family at bedside today.  Hospital course: Progressive decline in physical and mental status followed by syncope -Family chose comfort care measures only. -Palliative care consultation appreciated. -Residential hospice placement was tried but they did not have any bed available for several days.  Family made a decision to take him home with hospice.  DME arrangements made.  Okay to discharge today. -Prescription given for morphine, Haldol, Robinul and Remeron. -If family wants to continue his regular Parkinson meds, it is okay from my standpoint..  Issues addressed during this hospitalization Acute metabolic encephalopathy AKI on CKD stage IIIa Acute on chronic anemia   Parkinson's disease  Dementia Elevated troponin Hypokalemia Pyuria  Severe protein calorie malnutrition    Allergies as of 02/18/2021       Reactions   Amlodipine Other (See Comments)   Edema w/10 mg   Lorazepam Other (See Comments)   DO NOT GIVE BENZO TO PARKINSON PT WITH CONCERNS FOR LEWY BODY DEMENTIA, CAUSES PARADOXICAL WORSENING OF THEIR AGITATION!   Penicillins Hives   Has patient had a PCN reaction causing immediate rash, facial/tongue/throat swelling, SOB or lightheadedness with hypotension: Yes Has patient had a PCN reaction causing severe rash involving mucus membranes or skin necrosis: No Has patient had a PCN reaction that required hospitalization No Has patient had a PCN reaction occurring within the last 10 years: No If all of the above answers are "NO", then may proceed  with Cephalosporin use.   Sulfonamide Derivatives Hives   Aspirin    bleeding   Atorvastatin    REACTION: leg cramps   Sulfa Antibiotics Other (See Comments), Palpitations   Sweating, passed out   Telmisartan Other (See Comments)   ?fatigue        Medication List     STOP taking these medications    rosuvastatin 10 MG  tablet Commonly known as: CRESTOR   vitamin B-12 1000 MCG tablet Commonly known as: CYANOCOBALAMIN   vitamin C 250 MG tablet Commonly known as: ASCORBIC ACID   Vitamin D 1000 units capsule       TAKE these medications    busPIRone 10 MG tablet Commonly known as: BUSPAR TAKE 1 TABLET BY MOUTH TWICE A DAY   carbidopa-levodopa 25-100 MG tablet Commonly known as: SINEMET IR TAKE 1 TABLET BY MOUTH THREE TIMES A DAY WITH MEALS   glycopyrrolate 1 MG tablet Commonly known as: ROBINUL Take 1 tablet (1 mg total) by mouth every 4 (four) hours as needed for up to 5 days (excessive secretions).   haloperidol 2 MG/ML solution Commonly known as: HALDOL Place 0.5 mLs (1 mg total) under the tongue every 4 (four) hours as needed for up to 3 days for agitation (or delirium).   mirtazapine 7.5 MG tablet Commonly known as: REMERON Take 1 tablet (7.5 mg total) by mouth at bedtime for 7 days.   morphine CONCENTRATE 10 MG/0.5ML Soln concentrated solution Take 0.25 mLs (5 mg total) by mouth every 3 (three) hours as needed for up to 7 days for severe pain, moderate pain or shortness of breath.   QUEtiapine 50 MG tablet Commonly known as: SEROQUEL TAKE 0.5-1 TABLETS (25-50 MG TOTAL) BY MOUTH AT BEDTIME.   rivastigmine 1.5 MG capsule Commonly known as: EXELON TAKE 1 CAPSULE (1.5 MG TOTAL) BY MOUTH 2 (TWO) TIMES DAILY.   SYRINGE-NEEDLE (DISP) 3 ML 25G X 1" 3 ML Misc Commonly known as: BD Eclipse Syringe Use sq as directed   traZODone 50 MG tablet Commonly known as: DESYREL TAKE 2 TABLETS BY MOUTH AT BEDTIME        Discharge Instructions:  Diet Recommendation: luxury feeding as tolerated   Follow with Primary MD Plotnikov, Georgina Quint, MD in 7 days    Follow ups:    Follow-up Information     Plotnikov, Georgina Quint, MD Follow up.   Specialty: Internal Medicine Contact information: [address] [place] [address] [phone]                 Wound care:      Discharge Exam:   Vitals:   02/16/21 0351 02/17/21 0450 02/17/21 2035 02/18/21 0425  BP: (!) 155/84 (!) 146/85 (!) 141/82 (!) 109/91  Pulse: 68 64 69 64  Resp: 16 17 19 16   Temp: 97.9 F (36.6 C) (!) 97.5 F (36.4 C) 98.1 F (36.7 C) 97.6 F (36.4 C)  TempSrc: Oral Oral Oral Oral  SpO2: 100% 98% 97%   Weight:        Body mass index is 18.7 kg/m.  General exam: Pleasant, elderly Caucasian male.  Comfortable. Did not do a detailed examination because of comfort care status. Time coordinating discharge: 25 minutes   The results of significant diagnostics from this hospitalization (including imaging, microbiology, ancillary and laboratory) are listed below for reference.    Procedures and Diagnostic Studies:   CT HEAD WO CONTRAST ( )  Result Date: 02/09/2021 CLINICAL DATA:  Unknown cause  altered mental status. EXAM: CT HEAD WITHOUT CONTRAST TECHNIQUE: Contiguous axial images were obtained from the base of the skull through the vertex without intravenous contrast. COMPARISON:  CT head 05/10/2020 FINDINGS: Brain: Cerebral ventricle sizes are concordant with the degree of cerebral volume loss. Patchy and confluent areas of decreased attenuation are noted throughout the deep and periventricular white matter of the cerebral hemispheres bilaterally, compatible with chronic microvascular ischemic disease. No evidence of large-territorial acute infarction. No parenchymal hemorrhage. No mass lesion. No extra-axial collection. No mass effect or midline shift. No hydrocephalus. Basilar cisterns are patent. Vascular: No hyperdense vessel. Atherosclerotic calcifications are present within the cavernous internal carotid arteries. Skull: No acute fracture or focal lesion. Sinuses/Orbits: Paranasal sinuses and mastoid air cells are clear. Right lens replacement. Otherwise orbits are unremarkable. Other: None. IMPRESSION: Negative for acute traumatic injury. Electronically Signed   By: Tish Frederickson  M.D.   On: 02/09/2021 19:42   DG Chest Portable 1 View  Result Date: 02/09/2021 CLINICAL DATA:  Altered mental status.  Hypertension. EXAM: PORTABLE CHEST 1 VIEW COMPARISON:  None. FINDINGS: The heart size and mediastinal contours are within normal limits. Ectasia and atherosclerotic calcification of thoracic aorta noted. Both lungs are clear. The visualized skeletal structures are unremarkable. IMPRESSION: No active disease. Electronically Signed   By: Danae Orleans M.D.   On: 02/09/2021 19:03   ECHOCARDIOGRAM LIMITED  Result Date: 02/10/2021    ECHOCARDIOGRAM LIMITED REPORT   Patient Name:   DERECK AGERTON Date of Exam: 02/10/2021 Medical Rec #:  [id number]      Height:       68.0 in Accession #:    [id number]     Weight:       158.0 lb Date of Birth:  04-Feb-1941       BSA:          1.849 m Patient Age:    80 years       BP:           125/68 mmHg Patient Gender: M              HR:           74 bpm. Exam Location:  Inpatient Procedure: Limited Color Doppler and Color Doppler Indications:    Elevated troponin  History:        Patient has prior history of Echocardiogram examinations, most                 recent 05/04/2020. COPD, Signs/Symptoms:Altered Mental Status;                 Risk Factors:Former Smoker. Parkinsons's dementia.  Sonographer:    Roosvelt Maser RDCS Referring Phys: [id number] CHRISTOPHER P DANFORD  Sonographer Comments: Technically challenging study due to limited acoustic windows, Technically difficult study due to poor echo windows, suboptimal parasternal window, suboptimal subcostal window and no apical window. Image acquisition challenging due to COPD. IMPRESSIONS  1. Extremely limited study with just a few views.  2. Left ventricular ejection fraction, by estimation, is 65 to 70% based on brief glimpses of the myocardium mainly in parasternal and apical views. The left ventricle has normal function. Left ventricular endocardial border not optimally defined to evaluate regional wall motion.  3.  Right ventricular systolic function is normal. The right ventricular size is not well visualized. Comparison(s): Prior images reviewed side by side. LVEF looks to be grossly normal range as before. FINDINGS  Left Ventricle: Left ventricular ejection fraction, by estimation, is  65 to 70%. The left ventricle has normal function. Left ventricular endocardial border not optimally defined to evaluate regional wall motion. Right Ventricle: The right ventricular size is not well visualized. Right vetricular wall thickness was not assessed. Right ventricular systolic function is normal. Nona Dell MD Electronically signed by Nona Dell MD Signature Date/Time: 02/10/2021/11:47:27 AM    Final      Labs:   Basic Metabolic Panel: Recent Labs  Lab 02/11/21 1637  K 3.8   GFR Estimated Creatinine Clearance: 28.1 mL/min (A) (by C-G formula based on SCr of 1.63 mg/dL (H)). Liver Function Tests: No results for input(s): AST, ALT, ALKPHOS, BILITOT, PROT, ALBUMIN in the last 168 hours. No results for input(s): LIPASE, AMYLASE in the last 168 hours. No results for input(s): AMMONIA in the last 168 hours. Coagulation profile No results for input(s): INR, PROTIME in the last 168 hours.  CBC: No results for input(s): WBC, NEUTROABS, HGB, HCT, MCV, PLT in the last 168 hours. Cardiac Enzymes: No results for input(s): CKTOTAL, CKMB, CKMBINDEX, TROPONINI in the last 168 hours. BNP: Invalid input(s): POCBNP CBG: No results for input(s): GLUCAP in the last 168 hours. D-Dimer No results for input(s): DDIMER in the last 72 hours. Hgb A1c No results for input(s): HGBA1C in the last 72 hours. Lipid Profile No results for input(s): CHOL, HDL, LDLCALC, TRIG, CHOLHDL, LDLDIRECT in the last 72 hours. Thyroid function studies No results for input(s): TSH, T4TOTAL, T3FREE, THYROIDAB in the last 72 hours.  Invalid input(s): FREET3 Anemia work up No results for input(s): VITAMINB12, FOLATE, FERRITIN, TIBC,  IRON, RETICCTPCT in the last 72 hours. Microbiology Recent Results (from the past 240 hour(s))  Resp Panel by RT-PCR (Flu A&B, Covid) Nasopharyngeal Swab     Status: None   Collection Time: 02/09/21  6:17 PM   Specimen: Nasopharyngeal Swab; Nasopharyngeal(NP) swabs in vial transport medium  Result Value Ref Range Status   SARS Coronavirus 2 by RT PCR NEGATIVE NEGATIVE Final    Comment: (NOTE) SARS-CoV-2 target nucleic acids are NOT DETECTED.  The SARS-CoV-2 RNA is generally detectable in upper respiratory specimens during the acute phase of infection. The lowest concentration of SARS-CoV-2 viral copies this assay can detect is 138 copies/mL. A negative result does not preclude SARS-Cov-2 infection and should not be used as the sole basis for treatment or other patient management decisions. A negative result may occur with  improper specimen collection/handling, submission of specimen other than nasopharyngeal swab, presence of viral mutation(s) within the areas targeted by this assay, and inadequate number of viral copies(<138 copies/mL). A negative result must be combined with clinical observations, patient history, and epidemiological information. The expected result is Negative.  Fact Sheet for Patients:  BloggerCourse.com  Fact Sheet for Healthcare Providers:  SeriousBroker.it  This test is no t yet approved or cleared by the [place] FDA and  has been authorized for detection and/or diagnosis of SARS-CoV-2 by FDA under an Emergency Use Authorization (EUA). This EUA will remain  in effect (meaning this test can be used) for the duration of the COVID-19 declaration under Section 564(b)(1) of the Act, 21 U.S.C.section 360bbb-3(b)(1), unless the authorization is terminated  or revoked sooner.       Influenza A by PCR NEGATIVE NEGATIVE Final   Influenza B by PCR NEGATIVE NEGATIVE Final    Comment: (NOTE) The Xpert Xpress  SARS-CoV-2/FLU/RSV plus assay is intended as an aid in the diagnosis of influenza from Nasopharyngeal swab specimens and should not be used as  a sole basis for treatment. Nasal washings and aspirates are unacceptable for Xpert Xpress SARS-CoV-2/FLU/RSV testing.  Fact Sheet for Patients: BloggerCourse.comhttps://www.fda.gov/media/152166/download  Fact Sheet for Healthcare Providers: SeriousBroker.ithttps://www.fda.gov/media/152162/download  This test is not yet approved or cleared by the [place] FDA and has been authorized for detection and/or diagnosis of SARS-CoV-2 by FDA under an Emergency Use Authorization (EUA). This EUA will remain in effect (meaning this test can be used) for the duration of the COVID-19 declaration under Section 564(b)(1) of the Act, 21 U.S.C. section 360bbb-3(b)(1), unless the authorization is terminated or revoked.  Performed at [facility], [address] [place], [place] [address]   Blood culture (routine x 2)     Status: None   Collection Time: 02/09/21 10:45 PM   Specimen: BLOOD RIGHT FOREARM  Result Value Ref Range Status   Specimen Description BLOOD RIGHT FOREARM  Final   Special Requests   Final    BOTTLES DRAWN AEROBIC AND ANAEROBIC Blood Culture adequate volume   Culture   Final    NO GROWTH 5 DAYS Performed at [facility], [address] [place], [place] [address]    Report Status 02/15/2021 FINAL  Final  Urine Culture     Status: Abnormal   Collection Time: 02/10/21 11:42 AM   Specimen: Urine, Catheterized  Result Value Ref Range Status   Specimen Description URINE, CATHETERIZED  Final   Special Requests   Final    NONE Performed at [facility], [address] [place], [place] [address]    Culture >=100,000 COLONIES/mL ESCHERICHIA COLI (A)  Final   Report Status 02/12/2021 FINAL  Final   Organism ID, Bacteria ESCHERICHIA COLI (A)  Final      Susceptibility   Escherichia coli - MIC*    AMPICILLIN 8 SENSITIVE Sensitive     CEFAZOLIN  <=4 SENSITIVE Sensitive     CEFEPIME <=0.12 SENSITIVE Sensitive     CEFTRIAXONE <=0.25 SENSITIVE Sensitive     CIPROFLOXACIN <=0.25 SENSITIVE Sensitive     GENTAMICIN <=1 SENSITIVE Sensitive     IMIPENEM <=0.25 SENSITIVE Sensitive     NITROFURANTOIN <=16 SENSITIVE Sensitive     TRIMETH/SULFA <=20 SENSITIVE Sensitive     AMPICILLIN/SULBACTAM 4 SENSITIVE Sensitive     PIP/TAZO <=4 SENSITIVE Sensitive     * >=100,000 COLONIES/mL ESCHERICHIA COLI     Signed: Porchea Charrier  [facility] 02/18/2021, 9:16 AM

## 2021-02-18 NOTE — Progress Notes (Signed)
Civil engineer, contracting [facility]) Hospital Liaison Note  Patient was pending referral for hospice services at home however due to new questionable seizure activity he returned to ER prior to nursing visit for initiation of hospice services.   [facility] liaison will follow and assist in facilitating timely visit once discharge happens.   Thank you,  Thea Gist, RN, BSN, [facility]

## 2021-02-19 ENCOUNTER — Telehealth: Payer: Self-pay | Admitting: Internal Medicine

## 2021-02-19 DIAGNOSIS — Z7401 Bed confinement status: Secondary | ICD-10-CM | POA: Diagnosis not present

## 2021-02-19 DIAGNOSIS — R531 Weakness: Secondary | ICD-10-CM | POA: Diagnosis not present

## 2021-02-19 DIAGNOSIS — Z743 Need for continuous supervision: Secondary | ICD-10-CM | POA: Diagnosis not present

## 2021-02-19 DIAGNOSIS — R410 Disorientation, unspecified: Secondary | ICD-10-CM | POA: Diagnosis not present

## 2021-02-19 NOTE — Telephone Encounter (Signed)
OK Thank you, AP

## 2021-02-19 NOTE — ED Provider Notes (Signed)
Hospice RN has been involved in patient's care plan & has spoken to patient's wife- plan is for discharge home with admission to hospice service @ 6:30PM today.     [name] 02/19/21 1420    Cheryll Cockayne, MD 02/21/21 [phone]

## 2021-02-19 NOTE — Telephone Encounter (Signed)
Kendal Hymen calling in on behalf of [facility]  Wants to get verbal okay from Dr. Posey Rea to oversee patient short hospice care stay  Callback [phone]

## 2021-02-19 NOTE — Progress Notes (Signed)
Civil engineer, contracting [facility]) Hospital Liaison note.    Visited pt at bedside. Pt opens eyes to voice, alert to self.  This RN in room with bedside RN who was able to give meds whole in applesauce with no overt s/s of aspiration.  Pt ate all of applesauce and drink 4-5 oz water.  Pt denies pain, SOB, no distress noted.  Spoke with pt's wife, [name], by phone to confirm plan for pt to discharge home this afternoon in anticipation of admitting onto hospice services at 630pm appt today.  Discussed with [name] possibility of transferring instead to [facility].  Discussed again unknown prognosis and possibility of charge for room and board.  [name] verbalized preference to bring pt home to care for him with hospice support.    Contact numbers for [facility] given again. Bedside RN made aware.  Thank you for the opportunity to participate in this patient's care.  Gillian Scarce, BSN, RN [facility] Liaison (listed on [place] under Hospice/[facility])    [phone] (24h on call)

## 2021-02-19 NOTE — Discharge Planning (Signed)
Laguana Desautel J. Lucretia Roers, RN, BSN, [place] [phone] RNCM consulted regarding discharge planning for [facility]. Offered pt medicare.gov list of home health agencies to choose from.  Pt chose [facility] to render services. Chrislyn of [facility] notified.

## 2021-02-19 NOTE — ED Notes (Signed)
Pt feed applesauce and jello

## 2021-02-19 NOTE — Discharge Instructions (Addendum)
Please follow up with your hospice care team and or primary care as soon as possible.  Return to the ER for new or worsening symptoms or any other concerns.

## 2021-02-19 NOTE — ED Notes (Signed)
Pt able to be woken by sternal rub but fall back asleep. Too lethargic for PO medications at this time

## 2021-02-20 NOTE — Telephone Encounter (Signed)
Notified [name] w/ MD response../[email or web address] 

## 2021-02-23 DIAGNOSIS — G9349 Other encephalopathy: Secondary | ICD-10-CM | POA: Diagnosis not present

## 2021-02-23 DIAGNOSIS — F0280 Dementia in other diseases classified elsewhere without behavioral disturbance: Secondary | ICD-10-CM | POA: Diagnosis not present

## 2021-02-23 DIAGNOSIS — G20 Parkinson's disease: Secondary | ICD-10-CM | POA: Diagnosis not present

## 2021-02-23 DIAGNOSIS — D638 Anemia in other chronic diseases classified elsewhere: Secondary | ICD-10-CM | POA: Diagnosis not present

## 2021-02-23 DIAGNOSIS — N1831 Chronic kidney disease, stage 3a: Secondary | ICD-10-CM | POA: Diagnosis not present

## 2021-02-23 DIAGNOSIS — I5032 Chronic diastolic (congestive) heart failure: Secondary | ICD-10-CM | POA: Diagnosis not present

## 2021-03-07 DIAGNOSIS — G9340 Encephalopathy, unspecified: Secondary | ICD-10-CM | POA: Diagnosis not present

## 2021-03-07 DIAGNOSIS — I69928 Other speech and language deficits following unspecified cerebrovascular disease: Secondary | ICD-10-CM | POA: Diagnosis not present

## 2021-03-07 DIAGNOSIS — R2689 Other abnormalities of gait and mobility: Secondary | ICD-10-CM | POA: Diagnosis not present

## 2021-03-07 DIAGNOSIS — Z9181 History of falling: Secondary | ICD-10-CM | POA: Diagnosis not present

## 2021-03-19 ENCOUNTER — Ambulatory Visit: Payer: Medicare Other | Admitting: Neurology

## 2021-03-24 DEATH — deceased

## 2021-03-27 ENCOUNTER — Telehealth: Payer: Self-pay

## 2021-03-27 NOTE — Telephone Encounter (Signed)
I need to know the date and the time of his death pleas Thx

## 2021-03-27 NOTE — Telephone Encounter (Signed)
[name] home asking that PCP sign pts death certificate.

## 2021-03-28 NOTE — Telephone Encounter (Addendum)
   Date of death 04-05-21 Time of death 6:18pm

## 2021-04-02 NOTE — Telephone Encounter (Signed)
Card sent to [employer]

## 2021-08-05 IMAGING — MR MR HEAD W/O CM
9 of 13 series · 30 of 48 positions shown · non-contrast
Comparison: 05/11/2020

CLINICAL DATA: Delirium.

EXAM:
MRI HEAD WITHOUT CONTRAST
TECHNIQUE: Multiplanar, multiecho pulse sequences of the brain and surrounding
structures were obtained without intravenous contrast.

[Series 2: DWI · axial · 3.0mm · 0.94mm/px · z∈[-73,+73]mm · 7 of 102 slices shown (1 of 4)]
[im 1/102]
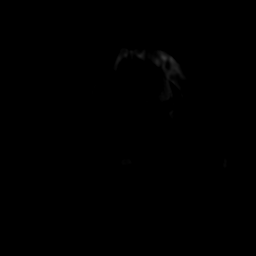
[im 17/102]
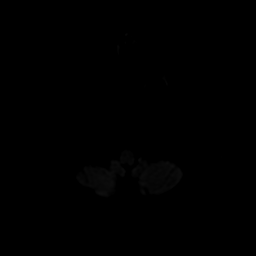
[im 34/102]
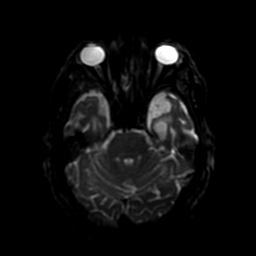
[im 51/102]
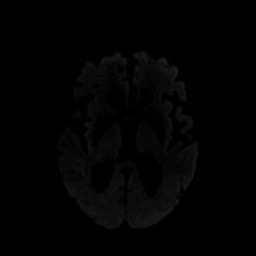
[im 68/102]
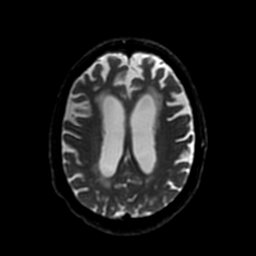
[im 85/102]
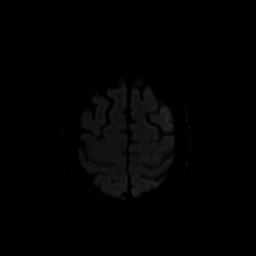
[im 102/102]
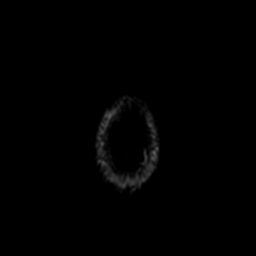

[Series 3: DWI · coronal · 4.0mm · 0.94mm/px · 4 of 74 slices shown (2 of 4)]
[im 1/74]
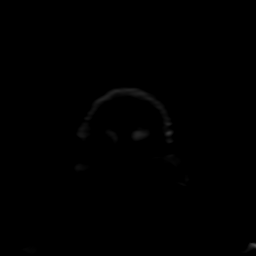
[im 25/74]
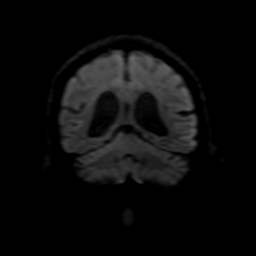
[im 49/74]
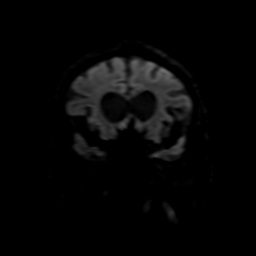
[im 74/74]
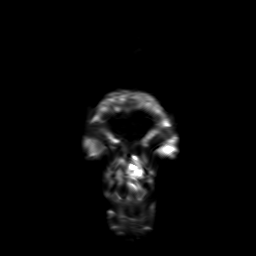

[Series 4: FLAIR · sagittal · 5.0mm · 0.23mm/px · 1 of 25 slices shown (1 of 2)]
[im 1/25]
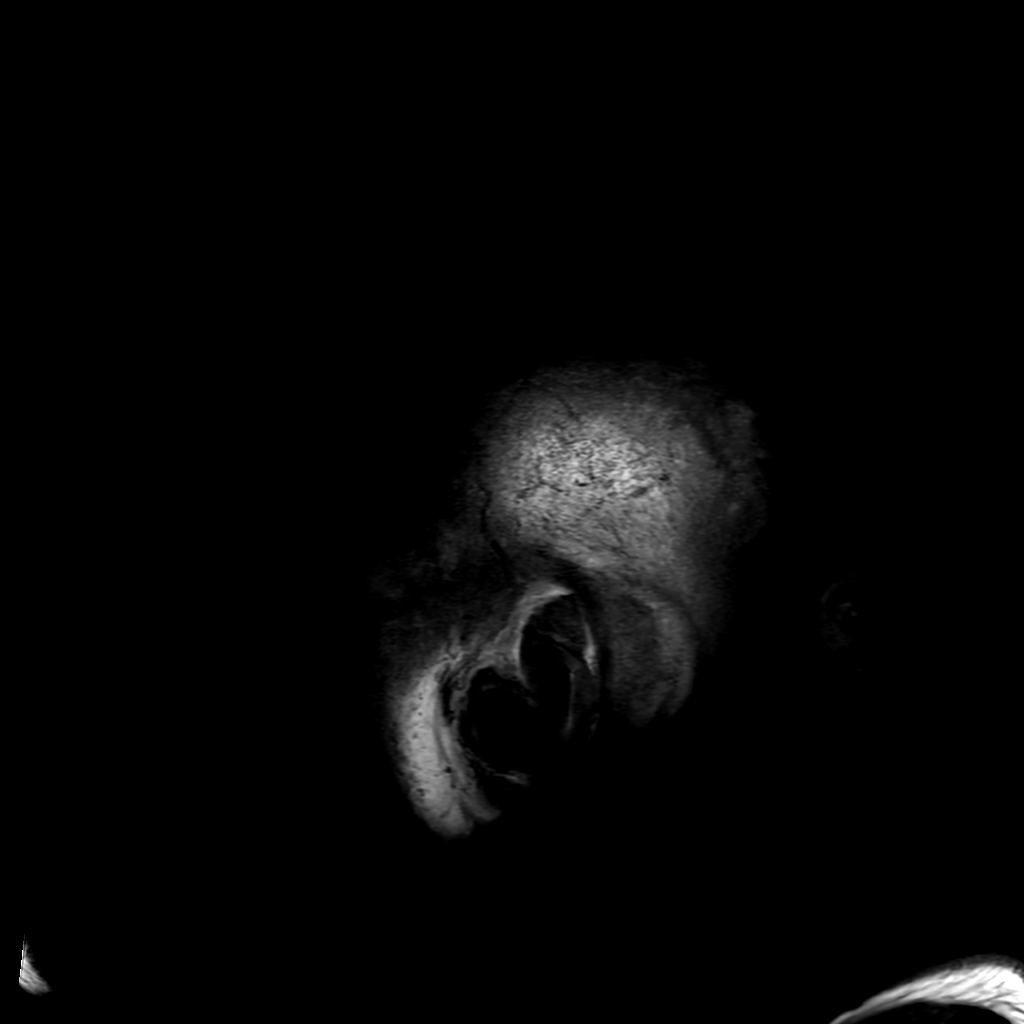

[Series 5: T2 · axial · 5.0mm · 0.23mm/px · 1 of 26 slices shown]
[im 1/26]
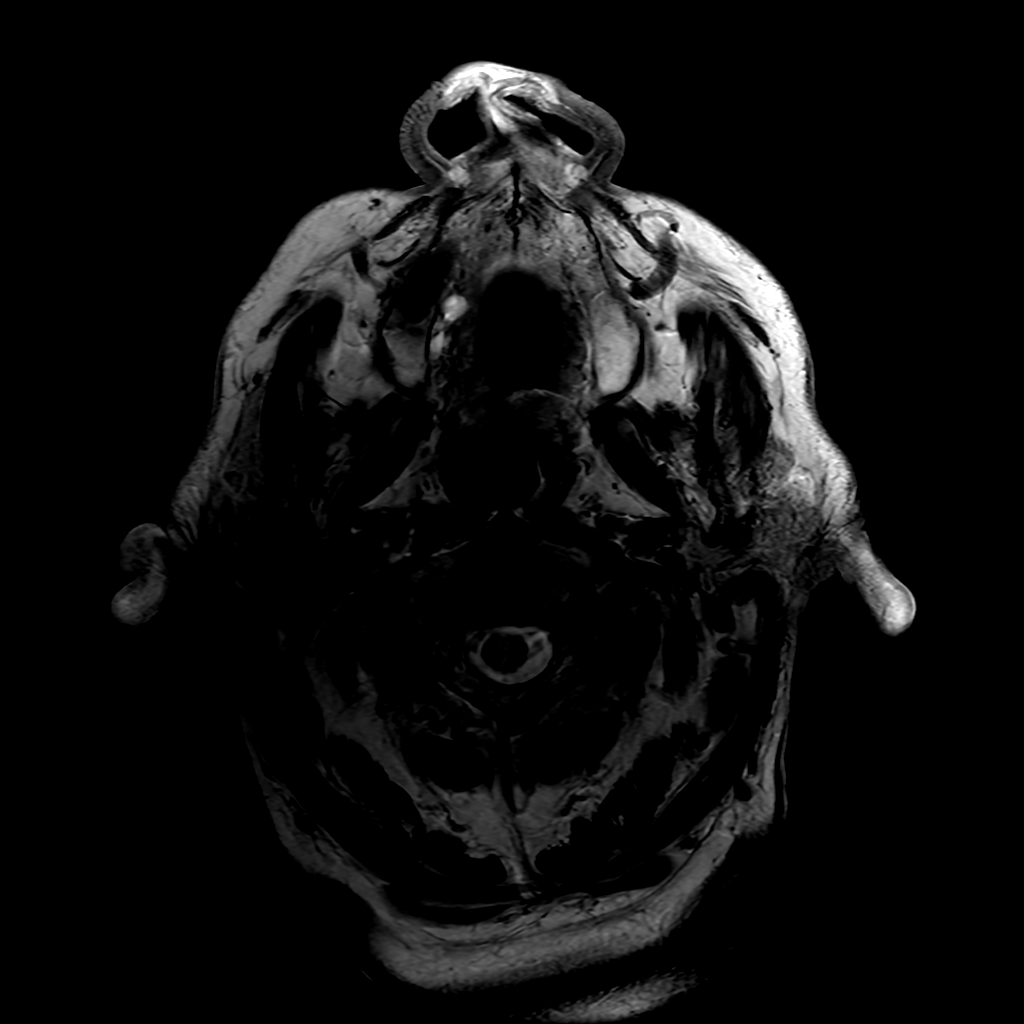

[Series 6: FLAIR · axial · 3.0mm · 0.45mm/px · z∈[-74,+73]mm · 2 of 26 slices shown (2 of 2)]
[im 1/26]
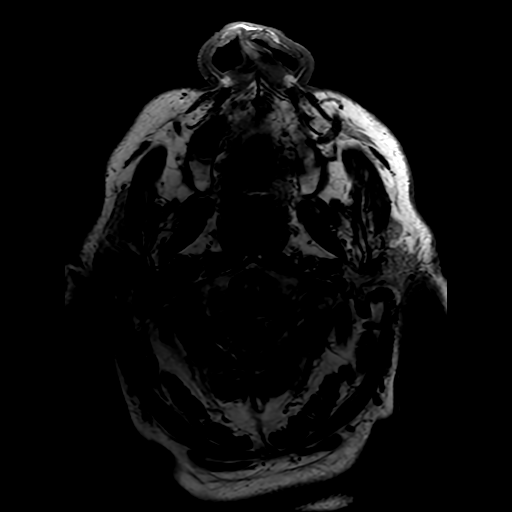
[im 26/26]
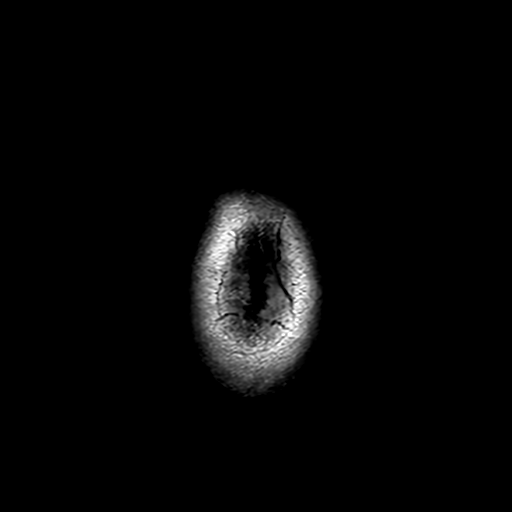

[Series 210: DWI · axial · 3.0mm · 0.94mm/px · z∈[-73,+73]mm · 6 of 102 slices shown (3 of 4)]
[im 1/102]
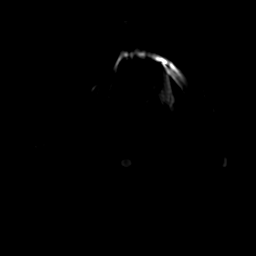
[im 21/102]
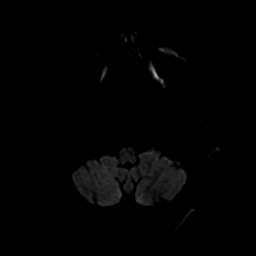
[im 41/102]
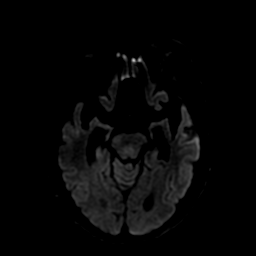
[im 61/102]
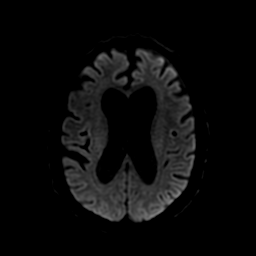
[im 81/102]
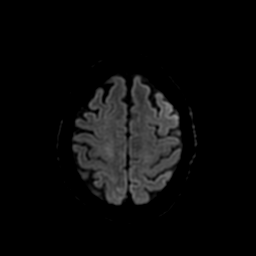
[im 102/102]
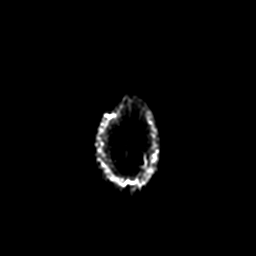

[Series 250: ADC · axial · 3.0mm · 0.94mm/px · z∈[-73,+73]mm · 3 of 51 slices shown (1 of 2)]
[im 1/51]
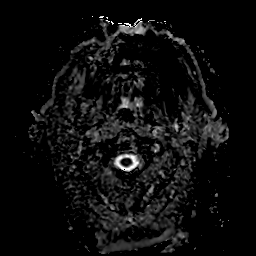
[im 26/51]
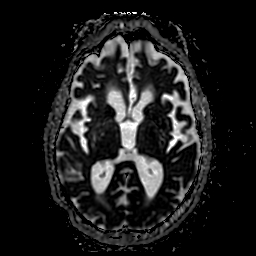
[im 51/51]
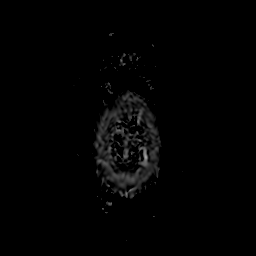

[Series 310: DWI · coronal · 4.0mm · 0.94mm/px · 4 of 74 slices shown (4 of 4)]
[im 1/74]
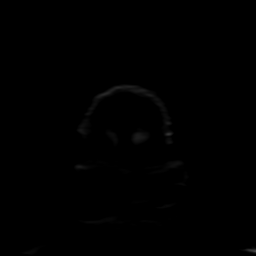
[im 25/74]
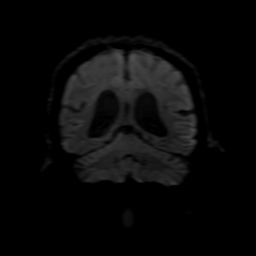
[im 49/74]
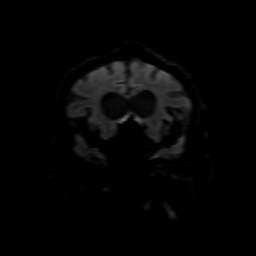
[im 74/74]
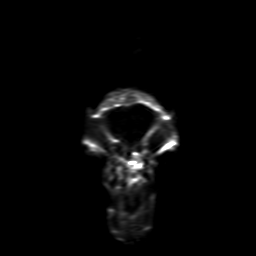

[Series 350: ADC · coronal · 4.0mm · 0.94mm/px · 2 of 37 slices shown (2 of 2)]
[im 1/37]
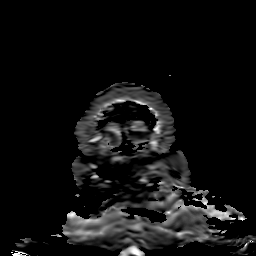
[im 37/37]
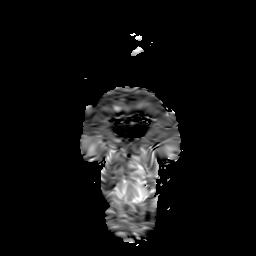

[30 of 48 positions shown; findings below may reference images not displayed]

FINDINGS: Brain: There is no evidence of an acute infarct, mass, midline
shift, or extra-axial fluid collection. There may be a single
chronic microhemorrhage in the right cerebellum. Patchy and early
confluent T2 hyperintensities in the cerebral white matter are
unchanged and nonspecific but compatible with moderate chronic small
vessel ischemic disease. Mild chronic small vessel changes are
present in the pons. Ventricular enlargement is unchanged and
favored to be secondary to cerebral atrophy rather than
hydrocephalus with note again made of severe bilateral mesial
temporal lobe atrophy.

Vascular: Major intracranial vascular flow voids are preserved.

Skull and upper cervical spine: Unremarkable bone marrow signal.

Sinuses/Orbits: Right cataract extraction. Mild mucosal thickening
in the paranasal sinuses. Trace right mastoid effusion.

Other: None.
IMPRESSION: 1. No acute intracranial abnormality.
2. Moderate chronic small vessel ischemic disease and temporal lobe
predominant cerebral atrophy.
